# Patient Record
Sex: Female | Born: 1955
Health system: Southern US, Community
[De-identification: ages and names within clinical notes are randomized; demographics above are authoritative.]

## PROBLEM LIST (undated history)

## (undated) DIAGNOSIS — F329 Major depressive disorder, single episode, unspecified: Secondary | ICD-10-CM

## (undated) DIAGNOSIS — F32A Depression, unspecified: Secondary | ICD-10-CM

## (undated) DIAGNOSIS — J849 Interstitial pulmonary disease, unspecified: Secondary | ICD-10-CM

## (undated) DIAGNOSIS — R569 Unspecified convulsions: Secondary | ICD-10-CM

## (undated) DIAGNOSIS — M1990 Unspecified osteoarthritis, unspecified site: Secondary | ICD-10-CM

## (undated) DIAGNOSIS — F419 Anxiety disorder, unspecified: Secondary | ICD-10-CM

## (undated) DIAGNOSIS — R51 Headache: Secondary | ICD-10-CM

## (undated) DIAGNOSIS — I10 Essential (primary) hypertension: Secondary | ICD-10-CM

## (undated) DIAGNOSIS — B9681 Helicobacter pylori [H. pylori] as the cause of diseases classified elsewhere: Secondary | ICD-10-CM

## (undated) DIAGNOSIS — E7800 Pure hypercholesterolemia, unspecified: Secondary | ICD-10-CM

## (undated) DIAGNOSIS — K279 Peptic ulcer, site unspecified, unspecified as acute or chronic, without hemorrhage or perforation: Secondary | ICD-10-CM

## (undated) DIAGNOSIS — J189 Pneumonia, unspecified organism: Secondary | ICD-10-CM

## (undated) DIAGNOSIS — A0472 Enterocolitis due to Clostridium difficile, not specified as recurrent: Secondary | ICD-10-CM

## (undated) DIAGNOSIS — N200 Calculus of kidney: Secondary | ICD-10-CM

## (undated) DIAGNOSIS — B029 Zoster without complications: Secondary | ICD-10-CM

## (undated) DIAGNOSIS — L309 Dermatitis, unspecified: Secondary | ICD-10-CM

## (undated) DIAGNOSIS — J449 Chronic obstructive pulmonary disease, unspecified: Secondary | ICD-10-CM

## (undated) HISTORY — PX: APPENDECTOMY: SHX54

## (undated) HISTORY — PX: CHOLECYSTECTOMY: SHX55

## (undated) HISTORY — DX: Anxiety disorder, unspecified: F41.9

## (undated) HISTORY — PX: TONSILLECTOMY: SUR1361

## (undated) HISTORY — PX: ADENOIDECTOMY: SUR15

## (undated) HISTORY — PX: ABDOMINAL HYSTERECTOMY: SHX81

## (undated) HISTORY — DX: Dermatitis, unspecified: L30.9

---
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
## 2010-06-27 ENCOUNTER — Observation Stay (HOSPITAL_COMMUNITY)
Admission: EM | Admit: 2010-06-27 | Discharge: 2010-06-28 | Payer: Self-pay | Source: Home / Self Care | Admitting: Emergency Medicine

## 2010-12-29 LAB — COMPREHENSIVE METABOLIC PANEL
Alkaline Phosphatase: 58 U/L (ref 39–117)
BUN: 12 mg/dL (ref 6–23)
CO2: 23 mEq/L (ref 19–32)
Calcium: 8 mg/dL — ABNORMAL LOW (ref 8.4–10.5)
GFR calc non Af Amer: >60 mL/min (ref >60)
Glucose, Bld: 82 mg/dL (ref 70–99)
Total Protein: 6.2 g/dL (ref 6.0–8.3)

## 2010-12-29 LAB — URINALYSIS, ROUTINE W REFLEX MICROSCOPIC
Bilirubin Urine: NEGATIVE
Hgb urine dipstick: NEGATIVE
Ketones, ur: NEGATIVE mg/dL
Protein, ur: NEGATIVE mg/dL
Urobilinogen, UA: 0.2 mg/dL (ref 0.0–1.0)

## 2010-12-29 LAB — POCT CARDIAC MARKERS
CKMB, poc: <1 ng/mL — ABNORMAL LOW (ref 1.0–8.0)
CKMB, poc: <1 ng/mL — ABNORMAL LOW (ref 1.0–8.0)
Myoglobin, poc: 39.2 ng/mL (ref 12–200)
Myoglobin, poc: 42.6 ng/mL (ref 12–200)
Troponin i, poc: <0.05 ng/mL (ref 0.00–0.09)
Troponin i, poc: <0.05 ng/mL (ref 0.00–0.09)

## 2010-12-29 LAB — CARDIAC PANEL(CRET KIN+CKTOT+MB+TROPI)
CK, MB: 1 ng/mL (ref 0.3–4.0)
Relative Index: INVALID (ref 0.0–2.5)
Relative Index: INVALID (ref 0.0–2.5)
Total CK: 62 U/L (ref 7–177)
Troponin I: 0.01 ng/mL (ref 0.00–0.06)
Troponin I: 0.03 ng/mL (ref 0.00–0.06)

## 2010-12-29 LAB — CK TOTAL AND CKMB (NOT AT ARMC)
CK, MB: 1 ng/mL (ref 0.3–4.0)
Relative Index: INVALID (ref 0.0–2.5)
Total CK: 70 U/L (ref 7–177)

## 2010-12-29 LAB — CBC
HCT: 30.5 % — ABNORMAL LOW (ref 36.0–46.0)
MCHC: 34.1 g/dL (ref 30.0–36.0)
MCV: 96.5 fL (ref 78.0–100.0)
RDW: 12.5 % (ref 11.5–15.5)

## 2010-12-29 LAB — LIPID PANEL
Cholesterol: 152 mg/dL (ref 0–200)
HDL: 69 mg/dL (ref >39)
Total CHOL/HDL Ratio: 2.2 RATIO
Triglycerides: 148 mg/dL (ref <150)

## 2010-12-29 LAB — TROPONIN I: Troponin I: 0.03 ng/mL (ref 0.00–0.06)

## 2010-12-29 LAB — LIPASE, BLOOD: Lipase: 25 U/L (ref 11–59)

## 2010-12-29 LAB — DIFFERENTIAL
Basophils Relative: 0 % (ref 0–1)
Lymphs Abs: 3.5 10*3/uL (ref 0.7–4.0)
Monocytes Relative: 5 % (ref 3–12)
Neutro Abs: 3 10*3/uL (ref 1.7–7.7)
Neutrophils Relative %: 43 % (ref 43–77)

## 2012-04-11 ENCOUNTER — Telehealth: Payer: Self-pay

## 2012-04-11 NOTE — Telephone Encounter (Signed)
Received a phone call from [name]. York Spaniel he was calling for his wife, this pt, to schedule a colonoscopy. He talked to her about some dates and she now wants to schedule in August. I told him I do not have the August schedule but I will call back to triage and schedule sometime in July. ( Pt is on my recall for August appt.

## 2012-04-25 ENCOUNTER — Telehealth: Payer: Self-pay

## 2012-04-25 NOTE — Telephone Encounter (Signed)
Pt had wanted to have a colonoscopy in August sometime. I left message on machine that I now have the August schedule.

## 2012-04-30 NOTE — Telephone Encounter (Signed)
Called and spoke to [employer]. He said he is sending her the message and she will call when she is ready to schedule colonoscopy.

## 2012-05-20 ENCOUNTER — Other Ambulatory Visit: Payer: Self-pay

## 2012-05-20 DIAGNOSIS — Z139 Encounter for screening, unspecified: Secondary | ICD-10-CM

## 2012-05-27 NOTE — Telephone Encounter (Signed)
Gastroenterology Pre-Procedure Form     Request Date: 06/10/2012     Requesting Physician:   Husband called to schedule  No referral This is first colonoscopy    PATIENT INFORMATION:  Diane [name] is a 56 y.o., female (DOB=December 14, 1955).  PROCEDURE: Procedure(s) requested: colonoscopy Procedure Reason: screening for colon cancer  PATIENT REVIEW QUESTIONS: The patient reports the following:   1. Diabetes Melitis: no 2. Joint replacements in the past 12 months: no 3. Major health problems in the past 3 months: no 4. Has an artificial valve or MVP:no 5. Has been advised in past to take antibiotics in advance of a procedure like teeth cleaning: no}    MEDICATIONS & ALLERGIES:    Patient reports the following regarding taking any blood thinners:   Plavix? no Aspirin?yes  Coumadin?  no  Patient confirms/reports the following medications:  Current Outpatient Prescriptions  Medication Sig Dispense Refill  . aspirin 81 MG tablet Take 81 mg by mouth daily.      Marland Kitchen buPROPion (WELLBUTRIN XL) 150 MG 24 hr tablet Take 150 mg by mouth 3 (three) times daily.      . diclofenac (VOLTAREN) 75 MG EC tablet Take 75 mg by mouth 2 (two) times daily.      Marland Kitchen loratadine (CLARITIN) 10 MG tablet Take 10 mg by mouth daily.      . Multiple Vitamin (MULTIVITAMIN) tablet Take 1 tablet by mouth daily.      . NON FORMULARY Probiotic  daily      . simvastatin (ZOCOR) 20 MG tablet Take 20 mg by mouth at bedtime.        Patient confirms/reports the following allergies:  Allergies  Allergen Reactions  . Penicillins     Unknown type of reaction    Patient is appropriate to schedule for requested procedure(s): yes  AUTHORIZATION INFORMATION Primary Insurance:   ID #:   Group #:  Pre-Cert / Auth required:  Pre-Cert / Auth #:   Secondary Insurance:   ID #:   Group #:  Pre-Cert / Auth required Pre-Cert / Auth #:   No orders of the defined types were placed in this encounter.    SCHEDULE  INFORMATION: Procedure has been scheduled as follows:  Date: 06/10/2012           Time: 12:45 PM  Location: [facility] Short Stay  This Gastroenterology Pre-Precedure Form is being routed to the following provider(s) for review: R. Roetta Sessions, MD

## 2012-05-28 ENCOUNTER — Encounter (HOSPITAL_COMMUNITY): Payer: Self-pay | Admitting: Pharmacy Technician

## 2012-05-28 NOTE — Telephone Encounter (Signed)
OK to schedule

## 2012-05-29 MED ORDER — PEG 3350-KCL-NA BICARB-NACL 420 G PO SOLR: 4000.0000 L | ORAL | Status: AC

## 2012-05-29 NOTE — Telephone Encounter (Signed)
Rx sent to [facility] [place].  Instructions mailed to pt.

## 2012-05-31 ENCOUNTER — Telehealth: Payer: Self-pay | Admitting: *Deleted

## 2012-05-31 NOTE — Telephone Encounter (Signed)
Mr Diane [name] called today for his wife. They would like to know if you can order her the pills for her prep as opposed to the drink. Please call them back. Also, they want [place] to be aware that she is a smoker. Thanks.

## 2012-06-03 NOTE — Telephone Encounter (Signed)
LMOM that Dr.Rourk does not use the pills. Call if more questions.

## 2012-06-10 ENCOUNTER — Encounter (HOSPITAL_COMMUNITY): Payer: Self-pay | Admitting: *Deleted

## 2012-06-10 ENCOUNTER — Ambulatory Visit (HOSPITAL_COMMUNITY)
Admission: RE | Admit: 2012-06-10 | Discharge: 2012-06-10 | Disposition: A | Discharge status: Home / Self Care | Payer: 59 | Source: Ambulatory Visit | Attending: Internal Medicine | Admitting: Internal Medicine

## 2012-06-10 ENCOUNTER — Encounter (HOSPITAL_COMMUNITY)
Admission: RE | Disposition: A | Discharge status: Home / Self Care | Payer: Self-pay | Source: Ambulatory Visit | Attending: Internal Medicine

## 2012-06-10 DIAGNOSIS — Z139 Encounter for screening, unspecified: Secondary | ICD-10-CM

## 2012-06-10 DIAGNOSIS — Z8371 Family history of colonic polyps: Secondary | ICD-10-CM

## 2012-06-10 DIAGNOSIS — Z1211 Encounter for screening for malignant neoplasm of colon: Secondary | ICD-10-CM

## 2012-06-10 DIAGNOSIS — D126 Benign neoplasm of colon, unspecified: Secondary | ICD-10-CM

## 2012-06-10 HISTORY — DX: Pneumonia, unspecified organism: J18.9

## 2012-06-10 HISTORY — DX: Unspecified osteoarthritis, unspecified site: M19.90

## 2012-06-10 HISTORY — DX: Headache: R51

## 2012-06-10 HISTORY — PX: COLONOSCOPY: SHX5424

## 2012-06-10 SURGERY — COLONOSCOPY
Anesthesia: Moderate Sedation

## 2012-06-10 MED ORDER — STERILE WATER FOR IRRIGATION IR SOLN
Status: DC | PRN
  Administered 2012-06-10: 13:00:00

## 2012-06-10 MED ORDER — MIDAZOLAM HCL 5 MG/5ML IJ SOLN
INTRAMUSCULAR | Status: AC
  Filled 2012-06-10: qty 10

## 2012-06-10 MED ORDER — MEPERIDINE HCL 100 MG/ML IJ SOLN
INTRAMUSCULAR | Status: DC | PRN
  Administered 2012-06-10: 50 mg via INTRAVENOUS
  Administered 2012-06-10: 25 mg via INTRAVENOUS
  Administered 2012-06-10: 50 mg via INTRAVENOUS

## 2012-06-10 MED ORDER — MIDAZOLAM HCL 5 MG/5ML IJ SOLN
INTRAMUSCULAR | Status: DC | PRN
  Administered 2012-06-10 (×3): 2 mg via INTRAVENOUS

## 2012-06-10 MED ORDER — SODIUM CHLORIDE 0.45 % IV SOLN
Freq: Once | INTRAVENOUS | Status: AC
  Administered 2012-06-10: 13:00:00 via INTRAVENOUS

## 2012-06-10 MED ORDER — MEPERIDINE HCL 100 MG/ML IJ SOLN
INTRAMUSCULAR | Status: AC
  Filled 2012-06-10: qty 2

## 2012-06-10 NOTE — Op Note (Signed)
[facility] [address] [place], [address]   COLONOSCOPY PROCEDURE REPORT  PATIENT: Diane [name], Diane [name]  MR#:         [id number] BIRTHDATE: 1956/09/06 , 56  yrs. old GENDER: Female ENDOSCOPIST: R.  Roetta Sessions, MD FACP [facility] REFERRED BY:   Marrian Salvage, [facility], [place], [place] PROCEDURE DATE:  06/10/2012 PROCEDURE:     colonoscopy with biopsy and snare polypectomy  INDICATIONS: first ever colonoscopy. Multiple family members with colonic polyps  INFORMED CONSENT:  The risks, benefits, alternatives and imponderables including but not limited to bleeding, perforation as well as the possibility of a missed lesion have been reviewed.  The potential for biopsy, lesion removal, etc. have also been discussed.  Questions have been answered.  All parties agreeable. Please see the history and physical in the medical record for more information.  MEDICATIONS: Versed 6 mg IV and Demerol 125 mg IV in divided doses.  DESCRIPTION OF PROCEDURE:  After a digital rectal exam was performed, the EC-[id number] ([id number])  colonoscope was advanced from the anus through the rectum and colon to the area of the cecum, ileocecal valve and appendiceal orifice.  The cecum was deeply intubated.  These structures were well-seen and photographed for the record.  From the level of the cecum and ileocecal valve, the scope was slowly and cautiously withdrawn.  The mucosal surfaces were carefully surveyed utilizing scope tip deflection to facilitate fold flattening as needed.  The scope was pulled down into the rectum where a thorough examination  was performed.    FINDINGS:  marginal preparation. Grossly normal rectum. Rectal vault small; unable to retroflex. Multiple polyps just distal to the ileocecal valve as well as the descending and sigmoid segments. The largest 5 mm in dimensions. Otherwise, the remainder of colonic mucosa appeared normal.  THERAPEUTIC / DIAGNOSTIC MANEUVERS  PERFORMED:  the above-mentioned polyps were cold biopsied and hot snared, respectively.  COMPLICATIONS: none  CECAL WITHDRAWAL TIME:  14 minutes  IMPRESSION:  colonic polyps-removed as described above.  RECOMMENDATIONS: Followup on pathology.   _______________________________ eSigned:  R. Roetta Sessions, MD FACP [facility] 06/10/2012 1:48 PM   CC:

## 2012-06-10 NOTE — H&P (Signed)
Primary Care Physician:  Marrian Salvage, [place] Primary Gastroenterologist:  Dr. Carla Drape  Pre-Procedure History & Physical: HPI:  Diane [name] is a 56 y.o. female is here for a screening colonoscopy. No bowel symptoms. No family history of colon cancer but mother, father brothers and sisters have had frequent colonoscopies because of polyps. No history of colectomy. No History of FAP.  No prior colonoscopy.  Past Medical History  Diagnosis Date  . Pneumonia     5 years ago  . Headache   . Arthritis     fingers    Past Surgical History  Procedure Date  . Abdominal hysterectomy   . Cholecystectomy   . Knee arthroscopy 1973    right knee,  torn cart    Prior to Admission medications   Medication Sig Start Date End Date Taking? Authorizing Provider  aspirin EC 81 MG tablet Take 81 mg by mouth daily.   Yes Historical Provider, MD  buPROPion (WELLBUTRIN SR) 150 MG 12 hr tablet Take 150 mg by mouth 3 (three) times daily.   Yes Historical Provider, MD  diclofenac (VOLTAREN) 75 MG EC tablet Take 75 mg by mouth 2 (two) times daily.   Yes Historical Provider, MD  loratadine (CLARITIN) 10 MG tablet Take 10 mg by mouth daily.   Yes Historical Provider, MD  Multiple Vitamin (MULTIVITAMIN WITH MINERALS) TABS Take 1 tablet by mouth daily.   Yes Historical Provider, MD  Probiotic Product (PROBIOTIC DAILY) CAPS Take 1 capsule by mouth daily.   Yes Historical Provider, MD  simvastatin (ZOCOR) 20 MG tablet Take 20 mg by mouth at bedtime.   Yes Historical Provider, MD    Allergies as of 05/20/2012  . (Not on File)    History reviewed. No pertinent family history.  History   Social History  . Marital Status: Married    Spouse Name: N/A    Number of Children: N/A  . Years of Education: N/A   Occupational History  . Not on file.   Social History Main Topics  . Smoking status: Current Everyday Smoker -- 0.2 packs/day for 30 years    Types: Cigarettes  . Smokeless tobacco: Not on file   . Alcohol Use: Yes     3 per day  . Drug Use: No  . Sexually Active:    Other Topics Concern  . Not on file   Social History Narrative  . No narrative on file    Review of Systems: See HPI, otherwise negative ROS  Physical Exam: BP 135/69  Pulse 65  Temp 97.8 F (36.6 C) (Oral)  Resp 22  Ht 5\' 5"  (1.651 m)  Wt 142 lb (64.411 kg)  BMI 23.63 kg/m2  SpO2 96% General:   Alert,  Well-developed, well-nourished, pleasant and cooperative in NAD Head:  Normocephalic and atraumatic. Eyes:  Sclera clear, no icterus.   Conjunctiva pink. Ears:  Normal auditory acuity. Nose:  No deformity, discharge,  or lesions. Mouth:  No deformity or lesions, dentition normal. Neck:  Supple; no masses or thyromegaly. Lungs:  Clear throughout to auscultation.   No wheezes, crackles, or rhonchi. No acute distress. Heart:  Regular rate and rhythm; no murmurs, clicks, rubs,  or gallops. Abdomen:  Soft, nontender and nondistended. No masses, hepatosplenomegaly or hernias noted. Normal bowel sounds, without guarding, and without rebound.   Msk:  Symmetrical without gross deformities. Normal posture. Pulses:  Normal pulses noted. Extremities:  Without clubbing or edema. Neurologic:  Alert and  oriented x4;  grossly normal neurologically.  Skin:  Intact without significant lesions or rashes. Cervical Nodes:  No significant cervical adenopathy. Psych:  Alert and cooperative. Normal mood and affect.  Impression/Plan: [name] is now here to undergo a screening colonoscopy.  Positive family history of colon polyps without further information known.  Risks, benefits, limitations, imponderables and alternatives regarding colonoscopy have been reviewed with the patient. Questions have been answered. All parties agreeable.

## 2012-06-13 ENCOUNTER — Encounter (HOSPITAL_COMMUNITY): Payer: Self-pay | Admitting: Internal Medicine

## 2012-06-13 ENCOUNTER — Encounter: Payer: Self-pay | Admitting: Internal Medicine

## 2012-06-18 ENCOUNTER — Encounter: Payer: Self-pay | Admitting: *Deleted

## 2012-10-06 ENCOUNTER — Emergency Department (HOSPITAL_COMMUNITY): Payer: 59

## 2012-10-06 ENCOUNTER — Emergency Department (HOSPITAL_COMMUNITY)
Admission: EM | Admit: 2012-10-06 | Discharge: 2012-10-07 | Disposition: A | Discharge status: Home / Self Care | Payer: 59 | Attending: Emergency Medicine | Admitting: Emergency Medicine

## 2012-10-06 ENCOUNTER — Encounter (HOSPITAL_COMMUNITY): Payer: Self-pay | Admitting: *Deleted

## 2012-10-06 DIAGNOSIS — R259 Unspecified abnormal involuntary movements: Secondary | ICD-10-CM | POA: Insufficient documentation

## 2012-10-06 DIAGNOSIS — R569 Unspecified convulsions: Secondary | ICD-10-CM

## 2012-10-06 DIAGNOSIS — R42 Dizziness and giddiness: Secondary | ICD-10-CM | POA: Insufficient documentation

## 2012-10-06 DIAGNOSIS — F17200 Nicotine dependence, unspecified, uncomplicated: Secondary | ICD-10-CM | POA: Insufficient documentation

## 2012-10-06 DIAGNOSIS — R55 Syncope and collapse: Secondary | ICD-10-CM | POA: Insufficient documentation

## 2012-10-06 DIAGNOSIS — Z7982 Long term (current) use of aspirin: Secondary | ICD-10-CM | POA: Insufficient documentation

## 2012-10-06 DIAGNOSIS — E7800 Pure hypercholesterolemia, unspecified: Secondary | ICD-10-CM | POA: Insufficient documentation

## 2012-10-06 DIAGNOSIS — R5381 Other malaise: Secondary | ICD-10-CM | POA: Insufficient documentation

## 2012-10-06 DIAGNOSIS — Z8739 Personal history of other diseases of the musculoskeletal system and connective tissue: Secondary | ICD-10-CM | POA: Insufficient documentation

## 2012-10-06 DIAGNOSIS — Z79899 Other long term (current) drug therapy: Secondary | ICD-10-CM | POA: Insufficient documentation

## 2012-10-06 DIAGNOSIS — Z8701 Personal history of pneumonia (recurrent): Secondary | ICD-10-CM | POA: Insufficient documentation

## 2012-10-06 DIAGNOSIS — F29 Unspecified psychosis not due to a substance or known physiological condition: Secondary | ICD-10-CM | POA: Insufficient documentation

## 2012-10-06 HISTORY — DX: Pure hypercholesterolemia, unspecified: E78.00

## 2012-10-06 LAB — COMPREHENSIVE METABOLIC PANEL
BUN: 11 mg/dL (ref 6–23)
CO2: 25 mEq/L (ref 19–32)
Chloride: 97 mEq/L (ref 96–112)
Creatinine, Ser: 0.65 mg/dL (ref 0.50–1.10)
GFR calc non Af Amer: >90 mL/min (ref >90)
Total Bilirubin: 0.3 mg/dL (ref 0.3–1.2)

## 2012-10-06 LAB — CBC WITH DIFFERENTIAL/PLATELET
Lymphocytes Relative: 20 % (ref 12–46)
Lymphs Abs: 2.1 10*3/uL (ref 0.7–4.0)
Neutrophils Relative %: 75 % (ref 43–77)
Platelets: 328 10*3/uL (ref 150–400)
RBC: 3.65 MIL/uL — ABNORMAL LOW (ref 3.87–5.11)
WBC: 10.6 10*3/uL — ABNORMAL HIGH (ref 4.0–10.5)

## 2012-10-06 LAB — URINALYSIS, ROUTINE W REFLEX MICROSCOPIC
Glucose, UA: NEGATIVE mg/dL
Leukocytes, UA: NEGATIVE
Protein, ur: NEGATIVE mg/dL
pH: 5.5 (ref 5.0–8.0)

## 2012-10-06 LAB — POCT I-STAT TROPONIN I: Troponin i, poc: 0 ng/mL (ref 0.00–0.08)

## 2012-10-06 MED ORDER — SODIUM CHLORIDE 0.9 % IV BOLUS (SEPSIS)
1000.0000 mL | Freq: Once | INTRAVENOUS | Status: AC
  Administered 2012-10-06: 1000 mL via INTRAVENOUS

## 2012-10-06 NOTE — ED Notes (Signed)
Dr. Rancour at bedside. 

## 2012-10-06 NOTE — ED Notes (Addendum)
Was sitting with friend, did not feel well, felt light headed, woozy, color changed, period of not speaking, period of shaking, also vomited and then "felt better" (partly her description, partly her paraphrase of friends previous description of what happened), seen by EMS at scene, "VS were good", encouraged to come to ED, declined transport, "gradually progressively better", here by POV. Alert, NAD, calm, interactive, skin W&D, resps e/u, speaking in clear complete sentences. C/o HA, 4/10, "also hunger, has not eaten". LS CTA, MAEx4 equally, speech clear, no droop or drift, PERRL 4mm.

## 2012-10-06 NOTE — ED Notes (Signed)
Provider in with patient.

## 2012-10-06 NOTE — ED Provider Notes (Signed)
History     CSN: [id number]  Arrival date & time 10/06/12  2000   First MD Initiated Contact with Patient 10/06/12 2101      Chief Complaint  Patient presents with  . Near Syncope  . Shaking    (Consider location/radiation/quality/duration/timing/severity/associated sxs/prior treatment) Patient is a 56 y.o. female presenting with general illness. The history is provided by the patient. No language interpreter was used.  Illness  The current episode started today. The problem occurs rarely. The problem has been resolved. The problem is moderate. Nothing relieves the symptoms. Nothing aggravates the symptoms. Pertinent negatives include no fever, no abdominal pain, no constipation, no diarrhea, no nausea, no vomiting, no congestion, no headaches, no sore throat, no cough and no rash.    Past Medical History  Diagnosis Date  . Pneumonia     5 years ago  . Headache   . Arthritis     fingers  . Hypercholesterolemia     Past Surgical History  Procedure Date  . Abdominal hysterectomy   . Cholecystectomy   . Knee arthroscopy 1973    right knee,  torn cart  . Colonoscopy 06/10/2012    Procedure: COLONOSCOPY;  Surgeon: Corbin Ade, MD;  Location: [facility];  Service: Endoscopy;  Laterality: N/A;  12:45 PM    History reviewed. No pertinent family history.  History  Substance Use Topics  . Smoking status: Current Every Day Smoker -- 0.2 packs/day for 30 years    Types: Cigarettes  . Smokeless tobacco: Not on file  . Alcohol Use: Yes     Comment: 3 per day    OB History    Grav Para Term Preterm Abortions TAB SAB Ect Mult Living                  Review of Systems  Constitutional: Negative for fever and chills.  HENT: Negative for congestion and sore throat.   Respiratory: Negative for cough and shortness of breath.   Cardiovascular: Negative for chest pain and leg swelling.  Gastrointestinal: Negative for nausea, vomiting, abdominal pain, diarrhea and  constipation.  Genitourinary: Negative for dysuria and frequency.  Skin: Negative for color change and rash.  Neurological: Positive for dizziness, syncope, weakness and light-headedness. Negative for headaches.  Psychiatric/Behavioral: Negative for confusion and agitation.  All other systems reviewed and are negative.    Allergies  Penicillins  Home Medications   Current Outpatient Rx  Name  Route  Sig  Dispense  Refill  . ASPIRIN EC 81 MG PO TBEC   Oral   Take 81 mg by mouth daily.         . BUPROPION HCL ER (SR) 150 MG PO TB12   Oral   Take 150 mg by mouth 2 (two) times daily. Take 2 tablets in the am and 1 tablet in the pm         . CALCIUM CARBONATE-VITAMIN D 500-200 MG-UNIT PO TABS   Oral   Take 1 tablet by mouth daily.         Marland Kitchen CETIRIZINE HCL 10 MG PO TABS   Oral   Take 10 mg by mouth daily.         . ADULT MULTIVITAMIN W/MINERALS CH   Oral   Take 1 tablet by mouth daily.         Marland Kitchen PROBIOTIC DAILY PO CAPS   Oral   Take 1 capsule by mouth daily.         Marland Kitchen  SIMVASTATIN 20 MG PO TABS   Oral   Take 20 mg by mouth at bedtime.           BP 139/70  Pulse 84  Temp 98 F (36.7 C) (Oral)  Resp 18  SpO2 100%  Physical Exam  Vitals reviewed. Constitutional: She is oriented to person, place, and time. She appears well-developed and well-nourished. No distress.  HENT:  Head: Normocephalic and atraumatic.  Eyes: EOM are normal. Pupils are equal, round, and reactive to light.  Neck: Normal range of motion. Neck supple.  Cardiovascular: Normal rate and regular rhythm.   Pulmonary/Chest: Effort normal. No respiratory distress.  Abdominal: Soft. She exhibits no distension.  Musculoskeletal: Normal range of motion. She exhibits no edema.  Neurological: She is alert and oriented to person, place, and time. She has normal strength. No cranial nerve deficit or sensory deficit. Coordination normal. GCS eye subscore is 4. GCS verbal subscore is 5. GCS motor  subscore is 6.  Reflex Scores:      Patellar reflexes are 2+ on the right side and 2+ on the left side. Skin: Skin is warm and dry.  Psychiatric: She has a normal mood and affect. Her behavior is normal.    ED Course  Procedures (including critical care time)  Results for orders placed during the hospital encounter of 10/06/12  COMPREHENSIVE METABOLIC PANEL      Component Value Range   Sodium 135  135 - 145 mEq/L   Potassium 4.0  3.5 - 5.1 mEq/L   Chloride 97  96 - 112 mEq/L   CO2 25  19 - 32 mEq/L   Glucose, Bld 99  70 - 99 mg/dL   BUN 11  6 - 23 mg/dL   Creatinine, Ser 1.47  0.50 - 1.10 mg/dL   Calcium 82.9  8.4 - 56.2 mg/dL   Total Protein 8.2  6.0 - 8.3 g/dL   Albumin 4.1  3.5 - 5.2 g/dL   AST 22  0 - 37 U/L   ALT 17  0 - 35 U/L   Alkaline Phosphatase 80  39 - 117 U/L   Total Bilirubin 0.3  0.3 - 1.2 mg/dL   GFR calc non Af Amer >90  >90 mL/min   GFR calc Af Amer >90  >90 mL/min  CBC WITH DIFFERENTIAL      Component Value Range   WBC 10.6 (*) 4.0 - 10.5 K/uL   RBC 3.65 (*) 3.87 - 5.11 MIL/uL   Hemoglobin 12.0  12.0 - 15.0 g/dL   HCT 13.0 (*) 86.5 - 78.4 %   MCV 95.6  78.0 - 100.0 fL   MCH 32.9  26.0 - 34.0 pg   MCHC 34.4  30.0 - 36.0 g/dL   RDW 69.6  29.5 - 28.4 %   Platelets 328  150 - 400 K/uL   Neutrophils Relative 75  43 - 77 %   Neutro Abs 7.9 (*) 1.7 - 7.7 K/uL   Lymphocytes Relative 20  12 - 46 %   Lymphs Abs 2.1  0.7 - 4.0 K/uL   Monocytes Relative 4  3 - 12 %   Monocytes Absolute 0.4  0.1 - 1.0 K/uL   Eosinophils Relative 1  0 - 5 %   Eosinophils Absolute 0.2  0.0 - 0.7 K/uL   Basophils Relative 0  0 - 1 %   Basophils Absolute 0.0  0.0 - 0.1 K/uL  URINALYSIS, ROUTINE W REFLEX MICROSCOPIC      Component  Value Range   Color, Urine YELLOW  YELLOW   APPearance CLEAR  CLEAR   Specific Gravity, Urine 1.013  1.005 - 1.030   pH 5.5  5.0 - 8.0   Glucose, UA NEGATIVE  NEGATIVE mg/dL   Hgb urine dipstick NEGATIVE  NEGATIVE   Bilirubin Urine NEGATIVE   NEGATIVE   Ketones, ur NEGATIVE  NEGATIVE mg/dL   Protein, ur NEGATIVE  NEGATIVE mg/dL   Urobilinogen, UA 0.2  0.0 - 1.0 mg/dL   Nitrite NEGATIVE  NEGATIVE   Leukocytes, UA NEGATIVE  NEGATIVE  POCT I-STAT TROPONIN I      Component Value Range   Troponin i, poc 0.00  0.00 - 0.08 ng/mL   Comment 3            CT Head Wo Contrast (Final result)   Result time:10/06/12 2315    Final result by Rad Results In Interface (10/06/12 23:15:38)    Narrative:   *RADIOLOGY REPORT*  Clinical Data: Near-syncope.  CT HEAD WITHOUT CONTRAST  Technique: Contiguous axial images were obtained from the base of the skull through the vertex without contrast.  Comparison: None.  Findings: The brain appears normal without infarct, hemorrhage, mass lesion, mass effect, midline shift or abnormal extra-axial fluid collection. No hydrocephalus or pneumocephalus. The calvarium is intact.  IMPRESSION: Negative study.   Original Report Authenticated By: Holley Dexter, M.D.          No results found.   No diagnosis found.    MDM  Pt w/ PMHx of HLD now w/ possible seizure/syncope event. States she was in normal state of health today. Had not eaten dinner. Drank an alcoholic beverage w/ a friend. Went to stand up and had acute onset lightheadedness, weakness, blurred vision, generalized shaking. Witness helped her to floor. She is unsure of true syncope. Witness states sx lasted approx 7 minutes. Pt was briefly confused and incontinent of urine. No prior hx of seizure activity. Pt does admit to one episode similar to this several wks ago but less severe. Denies prodrome of chest pain, palpitations, dyspnea or severe HA.   Exam: vitals normal, no focal deficit, well appearing, no seizure activity.   Ddx: possible new onset seizure, orthostatic hypotension, hypoglycemia, e-lyte abnormality. Based on hx exam doubt CVA, arrhythmia.   Plan: will check CT head, ECG, cbc, cmp, u/a, troponin,  orthostatics and give 1L ivf   Course: reassessed - vitals stable, NAD, CT head - NAICA, ECG - NSR no acute ischemic changes, normal intervals, troponin neg, u/a clear, hgb 12, e-lytes normal. Based on these findings increased concern for new onset seizure. Stable for d/c home. Recommend refrain from driving and ETOH until seen by neurology. Given referral and return precautions. D/c home in good condition. No seizure activity noted in ED.   1. Seizure-like activity   2. Atypical syncope    New Prescriptions   No medications on file   [facility] [address] [place] [address] [phone] Schedule an appointment as soon as possible for a visit         Audelia Hives, MD 10/07/12 [phone]

## 2012-10-08 NOTE — ED Provider Notes (Signed)
I saw and evaluated the patient, reviewed the resident's note and I agree with the findings and plan.  Episode of syncope versus seizure.  Drinking alcohol, became "dizzy", worsened with standing up, lightheadedness, "shaking".  Remembers event. Did not bite tongue but did have urine incontinence  And transient confusion.  Now back to baseline. No history of seizures. Nonfocal neuro exam.  Glynn Octave, MD 10/08/12 1010

## 2012-10-30 ENCOUNTER — Other Ambulatory Visit: Payer: Self-pay | Admitting: Neurology

## 2012-10-30 DIAGNOSIS — E785 Hyperlipidemia, unspecified: Secondary | ICD-10-CM

## 2012-10-30 DIAGNOSIS — F329 Major depressive disorder, single episode, unspecified: Secondary | ICD-10-CM

## 2012-10-30 DIAGNOSIS — F32A Depression, unspecified: Secondary | ICD-10-CM

## 2012-10-30 DIAGNOSIS — G40909 Epilepsy, unspecified, not intractable, without status epilepticus: Secondary | ICD-10-CM

## 2012-11-06 ENCOUNTER — Ambulatory Visit
Admission: RE | Admit: 2012-11-06 | Discharge: 2012-11-06 | Disposition: A | Discharge status: Home / Self Care | Payer: 59 | Source: Ambulatory Visit | Attending: Neurology | Admitting: Neurology

## 2012-11-06 DIAGNOSIS — F419 Anxiety disorder, unspecified: Secondary | ICD-10-CM

## 2012-11-06 DIAGNOSIS — G40909 Epilepsy, unspecified, not intractable, without status epilepticus: Secondary | ICD-10-CM

## 2012-11-06 DIAGNOSIS — F32A Depression, unspecified: Secondary | ICD-10-CM

## 2012-11-06 DIAGNOSIS — E785 Hyperlipidemia, unspecified: Secondary | ICD-10-CM

## 2012-11-06 MED ORDER — GADOBENATE DIMEGLUMINE 529 MG/ML IV SOLN
13.0000 mL | Freq: Once | INTRAVENOUS | Status: AC | PRN
  Administered 2012-11-06: 13 mL via INTRAVENOUS

## 2012-11-30 ENCOUNTER — Other Ambulatory Visit: Payer: Self-pay

## 2013-03-14 ENCOUNTER — Ambulatory Visit (INDEPENDENT_AMBULATORY_CARE_PROVIDER_SITE_OTHER): Payer: 59 | Admitting: Nurse Practitioner

## 2013-03-14 ENCOUNTER — Encounter: Payer: Self-pay | Admitting: Nurse Practitioner

## 2013-03-14 VITALS — BP 103/59 | HR 81 | Ht 64.0 in | Wt 150.0 lb

## 2013-03-14 DIAGNOSIS — R569 Unspecified convulsions: Secondary | ICD-10-CM

## 2013-03-14 MED ORDER — TOPIRAMATE 50 MG PO TABS: 50.0000 mg | ORAL_TABLET | Freq: Two times a day (BID) | ORAL | Status: DC

## 2013-03-14 NOTE — Patient Instructions (Addendum)
Continue Topamax 50 mg twice daily, renew med May start driving 11/15/8655 if continues to be seizure-free Followup in 6 months

## 2013-03-14 NOTE — Progress Notes (Signed)
HPI: Patient returns for followup after last visit 11/15/2012. She has a history of seizure disorder. When last seen she was having irritability on Keppra and was switched over to topiramate titrating it slowly until dose was therapeutic. Her last seizure occurred 10/06/2012. Her EEG was abnormal with evidence of intermittent pneumatic temporal lobe waves and also spike slow wave suggestive of epileptiform  discharges. Her MRI of the brain was normal, she is not driving until next month. She denies any side effects of the Topamax. She is off her Keppra at this point.  ROS:  Negative  Physical Exam General: well developed, well nourished, seated, in no evident distress Head: head normocephalic and atraumatic. Oropharynx benign Neck: supple with no carotid  bruits Cardiovascular: regular rate and rhythm, no murmurs  Neurologic Exam Mental Status: Awake and fully alert. Oriented to place and time. Follows all commands. Speech and language normal.   Cranial Nerves: Fundoscopic exam reveals sharp disc margins. Pupils equal, briskly reactive to light. Extraocular movements full without nystagmus. Visual fields full to confrontation. Hearing intact and symmetric to finger snap. Facial sensation intact. Face, tongue, palate move normally and symmetrically. Neck flexion and extension normal.  Motor: Normal bulk and tone. Normal strength in all tested extremity muscles.No focal weakness Sensory.: intact to touch and pinprick and vibratory.  Coordination: Rapid alternating movements normal in all extremities. Finger-to-nose and heel-to-shin performed accurately bilaterally. Gait and Station: Arises from chair without difficulty. Stance is normal. Gait demonstrates normal stride length and balance . Able to heel, toe and tandem walk without difficulty.  Reflexes: 2+ and symmetric. Toes downgoing.     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 Last  seizure 10/06/2012     PLAN: Continue topiramate 50 twice a day May begin driving 6 /16/ 1096 if no further seizure activity Followup in 6 months, call for any seizure activity  Nilda Riggs, GNP-BC APRN

## 2013-08-21 ENCOUNTER — Other Ambulatory Visit: Payer: Self-pay

## 2013-09-08 ENCOUNTER — Ambulatory Visit (INDEPENDENT_AMBULATORY_CARE_PROVIDER_SITE_OTHER): Payer: 59 | Admitting: Nurse Practitioner

## 2013-09-08 ENCOUNTER — Encounter: Payer: Self-pay | Admitting: Nurse Practitioner

## 2013-09-08 VITALS — BP 106/67 | HR 73 | Ht 64.0 in | Wt 144.0 lb

## 2013-09-08 DIAGNOSIS — R569 Unspecified convulsions: Secondary | ICD-10-CM

## 2013-09-08 MED ORDER — TOPIRAMATE 50 MG PO TABS: 50.0000 mg | ORAL_TABLET | Freq: Two times a day (BID) | ORAL | Status: DC

## 2013-09-08 NOTE — Patient Instructions (Signed)
Continue Topamax 50 mg twice daily Follow up yearly and when necessary Call for any seizure activity

## 2013-09-08 NOTE — Progress Notes (Signed)
[facility]  PATIENT: Diane [name] DOB: [birth year]   REASON FOR VISIT: Followup for seizures   HISTORY OF PRESENT ILLNESS:Diane [name], 57 year old female returns for followup. She has a history of seizure disorder. She had irritability on Keppra and was switched over to topiramate titrating it slowly until dose was therapeutic. Her last seizure occurred 10/06/2012. Her EEG was abnormal with evidence of intermittent pneumatic temporal lobe waves and also spike slow wave suggestive of epileptiform discharges. Her MRI of the brain was normal, she returned to driving in June 6045. .  She denies any side effects of the Topamax. She is off her Keppra at this point.    REVIEW OF SYSTEMS: Full 14 system review of systems performed and notable only for: all negative Constitutional: N/A  Cardiovascular: N/A  Ear/Nose/Throat: N/A  Skin: N/A  Eyes: N/A  Respiratory: N/A  Gastroitestinal: N/A  Hematology/Lymphatic: N/A  Endocrine: N/A Musculoskeletal:N/A  Allergy/Immunology: N/A  Neurological: N/A Psychiatric: N/A   ALLERGIES: Allergies  Allergen Reactions  . Penicillins     Unknown type of reaction    HOME MEDICATIONS: Outpatient Prescriptions Prior to Visit  Medication Sig Dispense Refill  . buPROPion (WELLBUTRIN SR) 150 MG 12 hr tablet Take 150 mg by mouth 2 (two) times daily. Take 2 tablets in the am      . calcium-vitamin D (OSCAL WITH D) 500-200 MG-UNIT per tablet Take 1 tablet by mouth daily.      . cetirizine (ZYRTEC) 10 MG tablet Take 10 mg by mouth daily.      Marland Kitchen escitalopram (LEXAPRO) 10 MG tablet Take 10 mg by mouth daily.       . Multiple Vitamin (MULTIVITAMIN WITH MINERALS) TABS Take 1 tablet by mouth daily.      . simvastatin (ZOCOR) 20 MG tablet Take 20 mg by mouth at bedtime.      . topiramate (TOPAMAX) 50 MG tablet Take 1 tablet (50 mg total) by mouth 2 (two) times daily.  60 tablet  6  . aspirin EC 81 MG tablet Take 81 mg by mouth  daily.      . Probiotic Product (PROBIOTIC DAILY) CAPS Take 1 capsule by mouth daily.       No facility-administered medications prior to visit.    PAST MEDICAL HISTORY: Past Medical History  Diagnosis Date  . Pneumonia     5 years ago  . Headache(784.0)   . Arthritis     fingers  . Hypercholesterolemia     PAST SURGICAL HISTORY: Past Surgical History  Procedure Laterality Date  . Abdominal hysterectomy    . Cholecystectomy    . Knee arthroscopy  1973    right knee,  torn cart  . Colonoscopy  06/10/2012    Procedure: COLONOSCOPY;  Surgeon: Corbin Ade, MD;  [facility];  Service: Endoscopy;  Laterality: N/A;  12:45 PM    FAMILY HISTORY: History reviewed. No pertinent family history.  SOCIAL HISTORY: History   Social History  . Marital Status: Married    Spouse Name: Diane [name]     Number of Children: 3  . Years of Education: 12+   Occupational History  .  [employer]   Social History Main Topics  . Smoking status: Current Every Day Smoker -- 0.25 packs/day for 30 years    Types: Cigarettes  . Smokeless tobacco: Never Used  . Alcohol Use: Yes     Comment: 3 per day  . Drug Use: No  . Sexual  Activity: Not on file   Other Topics Concern  . Not on file   Social History Narrative   The patient lives at home with husband Diane [name].    Patient has 3 children.    Patient has he Associates Degree.    Patient is currently working.      PHYSICAL EXAM  Filed Vitals:   09/08/13 1528  BP: 106/67  Pulse: 73  Height: 5\' 4"  (1.626 m)  Weight: 144 lb (65.318 kg)   Body mass index is 24.71 kg/(m^2).  Generalized: Well developed, in no acute distress  Neurological examination   Mentation: Alert oriented to time, place, history taking. Follows all commands speech and language fluent  Cranial nerve II-XII: Pupils were equal round reactive to light extraocular movements were full, visual field were full on confrontational test. Facial  sensation and strength were normal. hearing was intact to finger rubbing bilaterally. Uvula tongue midline. head turning and shoulder shrug and were normal and symmetric.Tongue protrusion into cheek strength was normal. Motor: normal bulk and tone, full strength in the BUE, BLE, fine finger movements normal, no pronator drift. No focal weakness Coordination: finger-nose-finger, heel-to-shin bilaterally, no dysmetria Reflexes: Brachioradialis 2/2, biceps 2/2, triceps 2/2, patellar 2/2, Achilles 2/2, plantar responses were flexor bilaterally. Gait and Station: Rising up from seated position without assistance, normal stance,  moderate stride, good arm swing, smooth turning, able to perform tiptoe, and heel walking without difficulty.   DIAGNOSTIC DATA (LABS, IMAGING, TESTING) - I reviewed patient records, labs, notes, testing and imaging myself where available.  Lab Results  Component Value Date   WBC 10.6* 10/06/2012   HGB 12.0 10/06/2012   HCT 34.9* 10/06/2012   MCV 95.6 10/06/2012   PLT 328 10/06/2012      Component Value Date/Time   NA 135 10/06/2012 2040   K 4.0 10/06/2012 2040   CL 97 10/06/2012 2040   CO2 25 10/06/2012 2040   GLUCOSE 99 10/06/2012 2040   BUN 11 10/06/2012 2040   CREATININE 0.65 10/06/2012 2040   CALCIUM 10.0 10/06/2012 2040   PROT 8.2 10/06/2012 2040   ALBUMIN 4.1 10/06/2012 2040   AST 22 10/06/2012 2040   ALT 17 10/06/2012 2040   ALKPHOS 80 10/06/2012 2040   BILITOT 0.3 10/06/2012 2040   GFRNONAA >90 10/06/2012 2040   GFRAA >90 10/06/2012 2040     ASSESSMENT AND PLAN  57 y.o. year old female  has a past medical history of seizure disorder here in followup. She is currently on Topamax 50 mg twice a day with last seizure occurring 10/06/2012.   Continue Topamax 50 mg twice daily Follow up yearly and when necessary Call for any seizure activity Nilda Riggs, [facility], [facility], APRN  [facility] [address], [address] [place],  [place] [address] [phone]

## 2013-10-16 DIAGNOSIS — A0472 Enterocolitis due to Clostridium difficile, not specified as recurrent: Secondary | ICD-10-CM

## 2013-10-16 HISTORY — DX: Enterocolitis due to Clostridium difficile, not specified as recurrent: A04.72

## 2013-12-23 ENCOUNTER — Other Ambulatory Visit: Payer: Self-pay

## 2013-12-23 MED ORDER — TOPIRAMATE 50 MG PO TABS: 50.0000 mg | ORAL_TABLET | Freq: Two times a day (BID) | ORAL | Status: DC

## 2014-07-23 ENCOUNTER — Other Ambulatory Visit: Payer: Self-pay | Admitting: Neurology

## 2014-08-13 ENCOUNTER — Encounter (HOSPITAL_COMMUNITY): Payer: Self-pay | Admitting: Emergency Medicine

## 2014-08-13 ENCOUNTER — Emergency Department (HOSPITAL_COMMUNITY): Payer: 59

## 2014-08-13 ENCOUNTER — Observation Stay (HOSPITAL_COMMUNITY)
Admission: EM | Admit: 2014-08-13 | Discharge: 2014-08-13 | Disposition: A | Discharge status: Home / Self Care | Payer: 59 | Attending: Internal Medicine | Admitting: Internal Medicine

## 2014-08-13 DIAGNOSIS — E780 Pure hypercholesterolemia: Secondary | ICD-10-CM | POA: Diagnosis not present

## 2014-08-13 DIAGNOSIS — J209 Acute bronchitis, unspecified: Secondary | ICD-10-CM | POA: Diagnosis present

## 2014-08-13 DIAGNOSIS — Z79899 Other long term (current) drug therapy: Secondary | ICD-10-CM | POA: Diagnosis not present

## 2014-08-13 DIAGNOSIS — M19049 Primary osteoarthritis, unspecified hand: Secondary | ICD-10-CM | POA: Diagnosis not present

## 2014-08-13 DIAGNOSIS — Z87891 Personal history of nicotine dependence: Secondary | ICD-10-CM | POA: Insufficient documentation

## 2014-08-13 DIAGNOSIS — Z888 Allergy status to other drugs, medicaments and biological substances status: Secondary | ICD-10-CM | POA: Diagnosis not present

## 2014-08-13 DIAGNOSIS — Z880 Allergy status to penicillin: Secondary | ICD-10-CM | POA: Diagnosis not present

## 2014-08-13 DIAGNOSIS — R0789 Other chest pain: Secondary | ICD-10-CM

## 2014-08-13 DIAGNOSIS — R079 Chest pain, unspecified: Secondary | ICD-10-CM | POA: Diagnosis present

## 2014-08-13 DIAGNOSIS — E785 Hyperlipidemia, unspecified: Secondary | ICD-10-CM | POA: Diagnosis present

## 2014-08-13 HISTORY — DX: Unspecified convulsions: R56.9

## 2014-08-13 HISTORY — DX: Enterocolitis due to Clostridium difficile, not specified as recurrent: A04.72

## 2014-08-13 LAB — CBC
HEMATOCRIT: 35.6 % — AB (ref 36.0–46.0)
HEMOGLOBIN: 12.1 g/dL (ref 12.0–15.0)
MCH: 32.4 pg (ref 26.0–34.0)
MCHC: 34 g/dL (ref 30.0–36.0)
MCV: 95.4 fL (ref 78.0–100.0)
Platelets: 306 10*3/uL (ref 150–400)
RBC: 3.73 MIL/uL — AB (ref 3.87–5.11)
RDW: 12.6 % (ref 11.5–15.5)
WBC: 6.4 10*3/uL (ref 4.0–10.5)

## 2014-08-13 LAB — I-STAT TROPONIN, ED: TROPONIN I, POC: 0 ng/mL (ref 0.00–0.08)

## 2014-08-13 LAB — HEPATIC FUNCTION PANEL
ALBUMIN: 3.5 g/dL (ref 3.5–5.2)
ALK PHOS: 75 U/L (ref 39–117)
ALT: 19 U/L (ref 0–35)
AST: 29 U/L (ref 0–37)
BILIRUBIN TOTAL: 0.3 mg/dL (ref 0.3–1.2)
Bilirubin, Direct: <0.2 mg/dL (ref 0.0–0.3)
TOTAL PROTEIN: 7.6 g/dL (ref 6.0–8.3)

## 2014-08-13 LAB — TROPONIN I
Troponin I: <0.3 ng/mL (ref <0.30)
Troponin I: <0.3 ng/mL (ref <0.30)
Troponin I: <0.3 ng/mL (ref <0.30)

## 2014-08-13 LAB — BASIC METABOLIC PANEL
Anion gap: 16 — ABNORMAL HIGH (ref 5–15)
BUN: 9 mg/dL (ref 6–23)
CALCIUM: 9.6 mg/dL (ref 8.4–10.5)
CO2: 25 meq/L (ref 19–32)
CREATININE: 0.49 mg/dL — AB (ref 0.50–1.10)
Chloride: 100 mEq/L (ref 96–112)
GFR calc Af Amer: >90 mL/min (ref >90)
GLUCOSE: 103 mg/dL — AB (ref 70–99)
Potassium: 5.2 mEq/L (ref 3.7–5.3)
Sodium: 141 mEq/L (ref 137–147)

## 2014-08-13 LAB — LIPASE, BLOOD: Lipase: 28 U/L (ref 11–59)

## 2014-08-13 MED ORDER — ACETAMINOPHEN 325 MG PO TABS: 650.0000 mg | ORAL_TABLET | ORAL | Status: DC | PRN

## 2014-08-13 MED ORDER — ONDANSETRON HCL 4 MG/2ML IJ SOLN: 4.0000 mg | Freq: Four times a day (QID) | INTRAMUSCULAR | Status: DC | PRN

## 2014-08-13 MED ORDER — ASPIRIN 325 MG PO TABS: 325.0000 mg | ORAL_TABLET | Freq: Every day | ORAL | Status: DC

## 2014-08-13 MED ORDER — BUPROPION HCL ER (SR) 150 MG PO TB12
150.0000 mg | ORAL_TABLET | Freq: Every day | ORAL | Status: DC
  Administered 2014-08-13: 150 mg via ORAL
  Filled 2014-08-13: qty 1

## 2014-08-13 MED ORDER — INFLUENZA VAC SPLIT QUAD 0.5 ML IM SUSY: 0.5000 mL | PREFILLED_SYRINGE | INTRAMUSCULAR | Status: DC

## 2014-08-13 MED ORDER — ESCITALOPRAM OXALATE 10 MG PO TABS
10.0000 mg | ORAL_TABLET | Freq: Every day | ORAL | Status: DC
  Administered 2014-08-13: 10 mg via ORAL
  Filled 2014-08-13: qty 1

## 2014-08-13 MED ORDER — FLUTICASONE-SALMETEROL 250-50 MCG/DOSE IN AEPB: 1.0000 | INHALATION_SPRAY | Freq: Two times a day (BID) | RESPIRATORY_TRACT | Status: DC

## 2014-08-13 MED ORDER — DOXYCYCLINE HYCLATE 100 MG PO CAPS: 100.0000 mg | ORAL_CAPSULE | Freq: Two times a day (BID) | ORAL | Status: DC

## 2014-08-13 MED ORDER — ASPIRIN EC 81 MG PO TBEC: 81.0000 mg | DELAYED_RELEASE_TABLET | Freq: Every day | ORAL | Status: DC

## 2014-08-13 MED ORDER — GUAIFENESIN-DM 100-10 MG/5ML PO SYRP: 5.0000 mL | ORAL_SOLUTION | ORAL | Status: DC | PRN

## 2014-08-13 MED ORDER — SIMVASTATIN 20 MG PO TABS
20.0000 mg | ORAL_TABLET | Freq: Every day | ORAL | Status: DC
  Filled 2014-08-13: qty 1

## 2014-08-13 MED ORDER — HEPARIN SODIUM (PORCINE) 5000 UNIT/ML IJ SOLN: 5000.0000 [IU] | Freq: Three times a day (TID) | INTRAMUSCULAR | Status: DC

## 2014-08-13 MED ORDER — MOMETASONE FURO-FORMOTEROL FUM 100-5 MCG/ACT IN AERO
2.0000 | INHALATION_SPRAY | Freq: Two times a day (BID) | RESPIRATORY_TRACT | Status: DC
  Administered 2014-08-13: 2 via RESPIRATORY_TRACT
  Filled 2014-08-13: qty 8.8

## 2014-08-13 NOTE — Progress Notes (Signed)
No bed available in room pt. To be admitted to. EVS notified. RN will notify ED RN when bed is ready for pt.

## 2014-08-13 NOTE — Progress Notes (Signed)
Patient admitted after midnight. Chart reviewed. Patient examined. No further discomfort. Workup unremarkable thus far. Never had stress test. Reasonable to do, given h/o smoking and hyperlipidemia.  However, chest/epigastric pain atypical. Pt would like to do as inpatient if needed. LFTs ok. Has had a cough for a few months, treated with abx, possibly levaquin as outpt. Ran out of advair. CXR shows mild interstitial prominence, but no definite infiltrate.  Doree Barthel, MD [facility] [phone]

## 2014-08-13 NOTE — Discharge Summary (Signed)
Physician Discharge Summary  Diane [name] KGM:[id number] DOB: 04-07-1956 DOA: 08/13/2014  PCP: Octavio Graves, DO  Admit date: 08/13/2014 Discharge date: 08/13/2014  Recommendations for Outpatient Follow-up:  Outpatient stress test to be arranged by Dr. Aundra Dubin  Discharge Diagnoses:  Principal Problem:   Chest pain on exertion Active Problems:   Hyperlipidemia   Acute bronchitis   Discharge Condition: stable  Filed Weights   08/13/14 0145 08/13/14 0730  Weight: 61.236 kg (135 lb) 62.4 kg (137 lb 9.1 oz)    History of present illness:  58 y.o. female h/o HLD, smoking, presents to ED with chest pain. Pain onset about 1 hour PTA, midsternal with radiation across chest. Stabbing in quality. Lasted minutes. Was postcoital. Reoccurred when walking to ambulance and again resolved. Currently pain free. Did have some associated SOB. States that it actually felt somewhat like previous gallbladder pain before cholecystectomy.  Hospital Course:  Observed on telemetry. MI ruled out. LFTs and lipase normal. Cardiology consulted and recommend outpatient stress test. No further discomfort, but upon further questioning, reports cough x1-2 months. No resolution after a course of antibiotics and rx for advair. May be contributing to chest pain. Will give a course of doxycycline for acute bronchitis. CXR without infiltrate.  Procedures:  none  Consultations:  [facility]  Discharge Exam: Filed Vitals:   08/13/14 1002  BP: 107/71  Pulse: 76  Temp:   Resp:     General: comfortable, occasional coughing spell Cardiovascular: RRR without MGR. No chest wall tenderness Respiratory: CTA without WRR Abd: s, nt, nd Ext no CCE. Homans negative and no calf tenderness   Discharge Instructions   Activity as tolerated - No restrictions    Complete by:  As directed      Diet - low sodium heart healthy    Complete by:  As directed           Current Discharge Medication  List    START taking these medications   Details  aspirin EC 81 MG tablet Take 1 tablet (81 mg total) by mouth daily.    doxycycline (VIBRAMYCIN) 100 MG capsule Take 1 capsule (100 mg total) by mouth 2 (two) times daily. Qty: 14 capsule, Refills: 0    Fluticasone-Salmeterol (ADVAIR DISKUS) 250-50 MCG/DOSE AEPB Inhale 1 puff into the lungs 2 (two) times daily. Qty: 60 each, Refills: 0      CONTINUE these medications which have NOT CHANGED   Details  buPROPion (WELLBUTRIN SR) 150 MG 12 hr tablet Take 150 mg by mouth daily.     cetirizine (ZYRTEC) 10 MG tablet Take 10 mg by mouth daily.    escitalopram (LEXAPRO) 10 MG tablet Take 10 mg by mouth daily.     Multiple Vitamin (MULTIVITAMIN WITH MINERALS) TABS Take 1 tablet by mouth daily.    simvastatin (ZOCOR) 20 MG tablet Take 20 mg by mouth at bedtime.       Allergies  Allergen Reactions  . Penicillins     Unknown type of reaction  . Keppra [Levetiracetam]     irritability   Follow-up Information   Follow up with Loralie Champagne, MD. (his office will call you to schedule stress test)    Specialty:  Cardiology   Contact information:   [address] [place] [address] [phone]       Follow up with Octavio Graves, DO. (as previously scheduled)    Contact information:   [address] [address] [phone]  The results of significant diagnostics from this hospitalization (including imaging, microbiology, ancillary and laboratory) are listed below for reference.    Significant Diagnostic Studies: Dg Chest 2 View  08/13/2014   CLINICAL DATA:  Right lower chest pain.  EXAM: CHEST  2 VIEW  COMPARISON:  06/27/2010  FINDINGS: Cardiomediastinal contours within normal range. Interstitial prominence. No confluent airspace opacity, pleural effusion, or pneumothorax. Surgical clips right upper quadrant. No acute osseous finding.  IMPRESSION: Mild interstitial prominence may reflect  chronic change versus atypical/viral infection if acute.   Electronically Signed   By: Carlos Levering M.D.   On: 08/13/2014 03:04   ekg Sinus rhythm Borderline short PR interval Abnormal R-wave progression, early transition Borderline T abnormalities, anterior leads  Microbiology: No results found for this or any previous visit (from the past 240 hour(s)).   Labs: Basic Metabolic Panel:  Recent Labs Lab 08/13/14 0144  NA 141  K 5.2  CL 100  CO2 25  GLUCOSE 103*  BUN 9  CREATININE 0.49*  CALCIUM 9.6   Liver Function Tests:  Recent Labs Lab 08/13/14 0456  AST 29  ALT 19  ALKPHOS 75  BILITOT 0.3  PROT 7.6  ALBUMIN 3.5    Recent Labs Lab 08/13/14 0832  LIPASE 28   No results found for this basename: AMMONIA,  in the last 168 hours CBC:  Recent Labs Lab 08/13/14 0144  WBC 6.4  HGB 12.1  HCT 35.6*  MCV 95.4  PLT 306   Cardiac Enzymes:  Recent Labs Lab 08/13/14 0456 08/13/14 0832  TROPONINI <0.30 <0.30   BNP: BNP (last 3 results) No results found for this basename: PROBNP,  in the last 8760 hours CBG: No results found for this basename: GLUCAP,  in the last 168 hours     Signed:  Novali Vollman L  [facility] 08/13/2014, 1:14 PM

## 2014-08-13 NOTE — ED Notes (Signed)
Floor RN refused to take report at this time due to a missing bed.  RN advised that we would place the patient in a bed from the floor that we have available in the ER but RN refused report and would have to call the [facility].    Charge RN in the ED made aware of the situation.

## 2014-08-13 NOTE — ED Notes (Signed)
Dr. Gardner at bedside 

## 2014-08-13 NOTE — Consult Note (Signed)
CARDIOLOGY CONSULT NOTE  Patient ID: Diane [name] MRN: [id number] DOB/AGE: 06/21/1956 58 y.o.  Admit date: 08/13/2014 Primary Cardiologist: New Reason for Consultation: Epigastric pain/lower chest pain  HPI: 58 yo with history of hyperlipidemia and no prior cardiac history was admitted last night with epigastric pain reminiscent of prior gallbladder pain.  She had had sex with her husband and was in bed shortly after when she developed severe aching in her epigstric area.  It resolved in 10 minutes.  She got up to walk around and again developed the severe epigastric aching, this time it wrapped around the sides of her torso  This resolved in 5 minutes.  She later had a 3rd episode for 5 minutes and decided to come to the ER. She has had no more significant epigastric pain though it has continued to feel a bit tight. The pain is like her gallbladder pain prior to cholecystectomy.  Cardiac enzymes negative, ECG not particularly changed from the past.  Risk factors are smoking and hyperlipidemia.  She just stopped smoking 10 days ago.  She has no history of exertional dyspnea or chest pain.   Review of systems complete and found to be negative unless listed above in HPI  Past Medical History: 1. H/o cholecystectomy 2. Hyperlipidemia 3. H/o C difficile diarrhea 4. H/o seizures 5. Hysterectomy 6. Appendectomy  FH:  Sister with lung cancer.  No premature CAD.  History   Social History  . Marital Status: Married    Spouse Name: [name]     Number of Children: 3  . Years of Education: 12+   Occupational History  .  [employer]   Social History Main Topics  . Smoking status: Former Smoker -- 1.00 packs/day for 30 years    Quit date: 08/03/2014  . Smokeless tobacco: Never Used  . Alcohol Use: Yes     Comment: 3 per day  . Drug Use: No  . Sexual Activity: Not on file   Other Topics Concern  . Not on file   Social History Narrative   The patient lives at home  with husband [name].    Patient has 3 children.    Patient has he Associates Degree.    Patient is currently working.      Prescriptions prior to admission  Medication Sig Dispense Refill  . buPROPion (WELLBUTRIN SR) 150 MG 12 hr tablet Take 150 mg by mouth daily.       . cetirizine (ZYRTEC) 10 MG tablet Take 10 mg by mouth daily.      Marland Kitchen escitalopram (LEXAPRO) 10 MG tablet Take 10 mg by mouth daily.       . Multiple Vitamin (MULTIVITAMIN WITH MINERALS) TABS Take 1 tablet by mouth daily.      . simvastatin (ZOCOR) 20 MG tablet Take 20 mg by mouth at bedtime.        Physical exam Blood pressure 107/71, pulse 76, temperature 97.9 F (36.6 C), temperature source Oral, resp. rate 18, height 5\' 6"  (1.676 m), weight 135 lb (61.236 kg), SpO2 97.00%. General: NAD Neck: No JVD, no thyromegaly or thyroid nodule.  Lungs: Occasional rhonchi CV: Nondisplaced PMI.  Heart regular S1/S2, no S3/S4, no murmur.  No peripheral edema.  No carotid bruit.  Normal pedal pulses.  Abdomen: Soft, nontender, no hepatosplenomegaly, no distention.  Skin: Intact without lesions or rashes.  Neurologic: Alert and oriented x 3.  Psych: Normal affect. Extremities: No clubbing or cyanosis.  HEENT: Normal.   Labs:  Lab Results  Component Value Date   WBC 6.4 08/13/2014   HGB 12.1 08/13/2014   HCT 35.6* 08/13/2014   MCV 95.4 08/13/2014   PLT 306 08/13/2014    Recent Labs Lab 08/13/14 0144 08/13/14 0456  NA 141  --   K 5.2  --   CL 100  --   CO2 25  --   BUN 9  --   CREATININE 0.49*  --   CALCIUM 9.6  --   PROT  --  7.6  BILITOT  --  0.3  ALKPHOS  --  75  ALT  --  19  AST  --  29  GLUCOSE 103*  --    Lab Results  Component Value Date   CKTOTAL 61 06/28/2010   CKMB 0.9 06/28/2010   TROPONINI <0.30 08/13/2014      Radiology: - CXR: Mild interstitial prominence  EKG: NSR, TWIs in V1 and V2.  These leads are not markedly different from ECG in 2013.   ASSESSMENT AND PLAN: 58 yo with  history of hyperlipidemia and no prior cardiac history was admitted last night with epigastric pain reminiscent of prior gallbladder pain. Symptoms have resolved.  Troponin negative x 3 and ECG without marked changes from prior.  Atypical symptoms.  I think that it would be safe to let her go home and followup for outpatient Cardiolite (would probably do Cardiolite over stress echo given smoking history which may make echo more difficult).  She should continue her simvastatin and start ASA 81 mg daily. Would continue to stay off cigarettes.  I will arrange the Cardiolite.   Loralie Champagne 08/13/2014

## 2014-08-13 NOTE — ED Notes (Signed)
Pt continues to deny pain since arrival to ED.

## 2014-08-13 NOTE — ED Notes (Signed)
Dr. Horton at bedside. 

## 2014-08-13 NOTE — Progress Notes (Signed)
UR completed 

## 2014-08-13 NOTE — ED Notes (Signed)
Attempted report 

## 2014-08-13 NOTE — H&P (Addendum)
[facility] History and Physical  Diane [name] XNA:[id number] DOB: [birth year] DOA: 08/13/2014  Referring physician: EDP PCP: Octavio Graves, DO   Chief Complaint: Chest pain   HPI: Diane [name] is a 58 y.o. female h/o HLD, smoking, presents to ED with chest pain.  Pain onset about 1 hour PTA, midsternal with radiation across chest.  Stabbing in quality.  Lasted minutes.  Was postcoital.  Reoccurred when walking to ambulance and again resolved.  Currently pain free.  Did have some associated SOB.  States that it actually felt somewhat like previous gallbladder pain before cholecystectomy.  Review of Systems: Systems reviewed.  As above, otherwise negative  Past Medical History  Diagnosis Date  . Pneumonia     5 years ago  . Headache(784.0)   . Arthritis     fingers  . Hypercholesterolemia   . C. difficile diarrhea   . Seizures    Past Surgical History  Procedure Laterality Date  . Abdominal hysterectomy    . Cholecystectomy    . Knee arthroscopy  1973    right knee,  torn cart  . Colonoscopy  06/10/2012    Procedure: COLONOSCOPY;  Surgeon: Daneil Dolin, MD;  [facility]: [facility];  Service: Endoscopy;  Laterality: N/A;  12:45 PM  . Appendectomy     Social History:  reports that she has quit smoking. She has never used smokeless tobacco. She reports that she drinks alcohol. She reports that she does not use illicit drugs. ("quit" this week).  Allergies  Allergen Reactions  . Penicillins     Unknown type of reaction  . Keppra [Levetiracetam]     irritability    No family history on file.   Prior to Admission medications   Medication Sig Start Date End Date Taking? Authorizing Provider  buPROPion (WELLBUTRIN SR) 150 MG 12 hr tablet Take 150 mg by mouth daily.    Yes Historical Provider, MD  cetirizine (ZYRTEC) 10 MG tablet Take 10 mg by mouth daily.   Yes Historical Provider, MD  escitalopram (LEXAPRO) 10 MG tablet Take 10 mg by mouth daily.   02/28/13  Yes Historical Provider, MD  Multiple Vitamin (MULTIVITAMIN WITH MINERALS) TABS Take 1 tablet by mouth daily.   Yes Historical Provider, MD  simvastatin (ZOCOR) 20 MG tablet Take 20 mg by mouth at bedtime.   Yes Historical Provider, MD   Physical Exam: Filed Vitals:   08/13/14 0430  BP: 101/64  Pulse: 73  Temp:   Resp: 18    BP 101/64  Pulse 73  Temp(Src) 97.9 F (36.6 C) (Oral)  Resp 18  Ht 5\' 6"  (1.676 m)  Wt 61.236 kg (135 lb)  BMI 21.80 kg/m2  SpO2 99%  General Appearance:    Alert, oriented, no distress, appears stated age  Head:    Normocephalic, atraumatic  Eyes:    PERRL, EOMI, sclera non-icteric        Nose:   Nares without drainage or epistaxis. Mucosa, turbinates normal  Throat:   Moist mucous membranes. Oropharynx without erythema or exudate.  Neck:   Supple. No carotid bruits.  No thyromegaly.  No lymphadenopathy.   Back:     No CVA tenderness, no spinal tenderness  Lungs:     Clear to auscultation bilaterally, without wheezes, rhonchi or rales  Chest wall:    No tenderness to palpitation  Heart:    Regular rate and rhythm without murmurs, gallops, rubs  Abdomen:     Soft, non-tender, nondistended, normal bowel  sounds, no organomegaly  Genitalia:    deferred  Rectal:    deferred  Extremities:   No clubbing, cyanosis or edema.  Pulses:   2+ and symmetric all extremities  Skin:   Skin color, texture, turgor normal, no rashes or lesions  Lymph nodes:   Cervical, supraclavicular, and axillary nodes normal  Neurologic:   CNII-XII intact. Normal strength, sensation and reflexes      throughout    Labs on Admission:  Basic Metabolic Panel:  Recent Labs Lab 08/13/14 0144  NA 141  K 5.2  CL 100  CO2 25  GLUCOSE 103*  BUN 9  CREATININE 0.49*  CALCIUM 9.6   Liver Function Tests: No results found for this basename: AST, ALT, ALKPHOS, BILITOT, PROT, ALBUMIN,  in the last 168 hours No results found for this basename: LIPASE, AMYLASE,  in the last  168 hours No results found for this basename: AMMONIA,  in the last 168 hours CBC:  Recent Labs Lab 08/13/14 0144  WBC 6.4  HGB 12.1  HCT 35.6*  MCV 95.4  PLT 306   Cardiac Enzymes: No results found for this basename: CKTOTAL, CKMB, CKMBINDEX, TROPONINI,  in the last 168 hours  BNP (last 3 results) No results found for this basename: PROBNP,  in the last 8760 hours CBG: No results found for this basename: GLUCAP,  in the last 168 hours  Radiological Exams on Admission: Dg Chest 2 View  08/13/2014   CLINICAL DATA:  Right lower chest pain.  EXAM: CHEST  2 VIEW  COMPARISON:  06/27/2010  FINDINGS: Cardiomediastinal contours within normal range. Interstitial prominence. No confluent airspace opacity, pleural effusion, or pneumothorax. Surgical clips right upper quadrant. No acute osseous finding.  IMPRESSION: Mild interstitial prominence may reflect chronic change versus atypical/viral infection if acute.   Electronically Signed   By: Carlos Levering M.D.   On: 08/13/2014 03:04    EKG: Independently reviewed. Non-specific T wave changes, similar to priors.  Assessment/Plan Principal Problem:   Chest pain on exertion   1. Chest pain on exertion 1. Check LFTs given the similarity to similar gallbladder colic prior to cholecystectomy in past. 2. Serial trops 3. Chest pain obs protocol 4. ASA 325 given in ambulance 5. HEART score of 4 6. Likely will need stress test though I suspect cards may elect to do this as outpatient. 7. Tele monitor    Code Status: Full Code  Family Communication: Husband at bedside Disposition Plan: Admit to obs   Time spent: 50 min  GARDNER, JARED M. [facility] Pager [phone]  If 7AM-7PM, please contact the day team taking care of the patient [email or web address] Password [email or web address] 08/13/2014, 4:56 AM

## 2014-08-13 NOTE — ED Provider Notes (Signed)
CSN: [id number]     Arrival date & time 08/13/14  0139 History   First MD Initiated Contact with Patient 08/13/14 0149     Chief Complaint  Patient presents with  . Chest Pain     (Consider location/radiation/quality/duration/timing/severity/associated sxs/prior Treatment) HPI  This is a 58 year old female with a history of hypercholesterolemia, smoking who presents with chest pain. Patient reports onset of chest pain approximately one hour prior to arrival. It was midsternal. It was postcoital. Patient states it felt like "a knife stabbing me. It lasted minutes. It subsided prior to EMS arrival. However, upon ambulation to the ambulance, patient reported return of pain. She is currently pain-free. She endorses associated shortness of breath but no diaphoresis. She was given full dose aspirin.  Patient states that it feels somewhat like when she had to have her gallbladder out.  Patient denies any association with food.  Past Medical History  Diagnosis Date  . Pneumonia     5 years ago  . Headache(784.0)   . Arthritis     fingers  . Hypercholesterolemia   . C. difficile diarrhea   . Seizures    Past Surgical History  Procedure Laterality Date  . Abdominal hysterectomy    . Cholecystectomy    . Knee arthroscopy  1973    right knee,  torn cart  . Colonoscopy  06/10/2012    Procedure: COLONOSCOPY;  Surgeon: Daneil Dolin, MD;  Location: [facility];  Service: Endoscopy;  Laterality: N/A;  12:45 PM  . Appendectomy     No family history on file. History  Substance Use Topics  . Smoking status: Former Smoker -- 0.00 packs/day for 30 years  . Smokeless tobacco: Never Used  . Alcohol Use: Yes     Comment: 3 per day   OB History   Grav Para Term Preterm Abortions TAB SAB Ect Mult Living                 Review of Systems  Constitutional: Negative for fever.  Respiratory: Positive for chest tightness and shortness of breath.   Cardiovascular: Positive for chest pain.  Negative for leg swelling.  Gastrointestinal: Negative for nausea, vomiting and abdominal pain.  Genitourinary: Negative for dysuria.  Musculoskeletal: Negative for back pain.  Neurological: Negative for headaches.  Psychiatric/Behavioral: Negative for confusion.  All other systems reviewed and are negative.     Allergies  Penicillins and Keppra  Home Medications   Prior to Admission medications   Medication Sig Start Date End Date Taking? Authorizing Provider  buPROPion (WELLBUTRIN SR) 150 MG 12 hr tablet Take 150 mg by mouth daily.    Yes Historical Provider, MD  cetirizine (ZYRTEC) 10 MG tablet Take 10 mg by mouth daily.   Yes Historical Provider, MD  escitalopram (LEXAPRO) 10 MG tablet Take 10 mg by mouth daily.  02/28/13  Yes Historical Provider, MD  Multiple Vitamin (MULTIVITAMIN WITH MINERALS) TABS Take 1 tablet by mouth daily.   Yes Historical Provider, MD  simvastatin (ZOCOR) 20 MG tablet Take 20 mg by mouth at bedtime.   Yes Historical Provider, MD   BP 114/78  Pulse 70  Temp(Src) 98.1 F (36.7 C) (Oral)  Resp 14  Ht 5\' 6"  (1.676 m)  Wt 135 lb (61.236 kg)  BMI 21.80 kg/m2  SpO2 95% Physical Exam  Nursing note and vitals reviewed. Constitutional: She is oriented to person, place, and time. She appears well-developed and well-nourished. No distress.  HENT:  Head: Normocephalic and  atraumatic.  Cardiovascular: Normal rate, regular rhythm and normal heart sounds.   No murmur heard. Pulmonary/Chest: Effort normal and breath sounds normal. No respiratory distress. She has no wheezes.  Abdominal: Soft. There is no tenderness.  Musculoskeletal: She exhibits no edema.  Neurological: She is alert and oriented to person, place, and time.  Skin: Skin is warm and dry.  Psychiatric: She has a normal mood and affect.    ED Course  Procedures (including critical care time) Labs Review Labs Reviewed  CBC - Abnormal; Notable for the following:    RBC 3.73 (*)    HCT 35.6  (*)    All other components within normal limits  BASIC METABOLIC PANEL - Abnormal; Notable for the following:    Glucose, Bld 103 (*)    Creatinine, Ser 0.49 (*)    Anion gap 16 (*)    All other components within normal limits  I-STAT TROPOININ, ED    Imaging Review No results found.   EKG Interpretation   Date/Time:  Thursday August 13 2014 01:44:48 EDT Ventricular Rate:  67 PR Interval:  112 QRS Duration: 90 QT Interval:  418 QTC Calculation: 441 R Axis:   73 Text Interpretation:  Sinus rhythm Borderline short PR interval Abnormal  R-wave progression, early transition Borderline T abnormalities, anterior  leads Similar to prior Confirmed by HORTON  MD, [place] ([id number]) on  08/13/2014 2:35:45 AM      MDM   Final diagnoses:  Chest pain    Patient presents with chest pain. Currently chest pain-free. Received full dose aspirin in route. Pain was in the setting of exertion and sex. Patient does have risk factors for heart disease including high cholesterol and smoking. EKG shows nonspecific T-wave abnormalities which are similar to prior. No evidence of acute ischemia. Heart score of 4 for story, age, and risk factors.  Workup is largely reassuring including initial troponin. Chest x-ray does show possible chronic versus viral infection; however, do not feel this fits with the patient's story. Given heart score, will admit for serial enzymes.      Merryl Hacker, MD 08/13/14 [phone]

## 2014-08-13 NOTE — ED Notes (Signed)
Pt arrives from [facility] with new onset intermittent CP, presently not in pain at triage. EMS gave 324 MG aspirin PTA, no nitro. While with EMS, pain has come and gone three times. 18 G IV palced in L AC. Pain located laterally across diaphragm. Last set VS141/71, 94% 4L, HR 68.

## 2014-08-13 NOTE — Progress Notes (Signed)
DC IV, DC Tele, DC Home. Discharge instructions and home medications discussed with patient and patient's family member. Patient and family member denied any questions or concerns at this time. Patient leaving unit via wheelchair and appears in no acute distress.

## 2014-08-16 DIAGNOSIS — J209 Acute bronchitis, unspecified: Secondary | ICD-10-CM | POA: Diagnosis present

## 2014-08-16 DIAGNOSIS — E785 Hyperlipidemia, unspecified: Secondary | ICD-10-CM | POA: Diagnosis present

## 2014-08-28 ENCOUNTER — Emergency Department (HOSPITAL_COMMUNITY): Payer: 59

## 2014-08-28 ENCOUNTER — Encounter (HOSPITAL_COMMUNITY): Payer: Self-pay | Admitting: Emergency Medicine

## 2014-08-28 ENCOUNTER — Emergency Department (HOSPITAL_COMMUNITY)
Admission: EM | Admit: 2014-08-28 | Discharge: 2014-08-28 | Disposition: A | Discharge status: Home / Self Care | Payer: 59 | Attending: Emergency Medicine | Admitting: Emergency Medicine

## 2014-08-28 DIAGNOSIS — Z8619 Personal history of other infectious and parasitic diseases: Secondary | ICD-10-CM | POA: Insufficient documentation

## 2014-08-28 DIAGNOSIS — Z7951 Long term (current) use of inhaled steroids: Secondary | ICD-10-CM | POA: Insufficient documentation

## 2014-08-28 DIAGNOSIS — Z7982 Long term (current) use of aspirin: Secondary | ICD-10-CM | POA: Insufficient documentation

## 2014-08-28 DIAGNOSIS — R42 Dizziness and giddiness: Secondary | ICD-10-CM | POA: Diagnosis not present

## 2014-08-28 DIAGNOSIS — E780 Pure hypercholesterolemia: Secondary | ICD-10-CM | POA: Insufficient documentation

## 2014-08-28 DIAGNOSIS — R110 Nausea: Secondary | ICD-10-CM | POA: Diagnosis not present

## 2014-08-28 DIAGNOSIS — R072 Precordial pain: Secondary | ICD-10-CM | POA: Diagnosis present

## 2014-08-28 DIAGNOSIS — Z880 Allergy status to penicillin: Secondary | ICD-10-CM | POA: Insufficient documentation

## 2014-08-28 DIAGNOSIS — Z79899 Other long term (current) drug therapy: Secondary | ICD-10-CM | POA: Insufficient documentation

## 2014-08-28 DIAGNOSIS — R51 Headache: Secondary | ICD-10-CM | POA: Insufficient documentation

## 2014-08-28 DIAGNOSIS — Z792 Long term (current) use of antibiotics: Secondary | ICD-10-CM | POA: Insufficient documentation

## 2014-08-28 DIAGNOSIS — Z8701 Personal history of pneumonia (recurrent): Secondary | ICD-10-CM | POA: Insufficient documentation

## 2014-08-28 DIAGNOSIS — Z87891 Personal history of nicotine dependence: Secondary | ICD-10-CM | POA: Insufficient documentation

## 2014-08-28 DIAGNOSIS — R079 Chest pain, unspecified: Secondary | ICD-10-CM

## 2014-08-28 LAB — BASIC METABOLIC PANEL
Anion gap: 14 (ref 5–15)
BUN: 17 mg/dL (ref 6–23)
CO2: 25 meq/L (ref 19–32)
CREATININE: 0.58 mg/dL (ref 0.50–1.10)
Calcium: 9.3 mg/dL (ref 8.4–10.5)
Chloride: 101 mEq/L (ref 96–112)
GFR calc Af Amer: >90 mL/min (ref >90)
GLUCOSE: 106 mg/dL — AB (ref 70–99)
Potassium: 3.8 mEq/L (ref 3.7–5.3)
Sodium: 140 mEq/L (ref 137–147)

## 2014-08-28 LAB — CBC
HCT: 38.9 % (ref 36.0–46.0)
HEMOGLOBIN: 12.9 g/dL (ref 12.0–15.0)
MCH: 32.2 pg (ref 26.0–34.0)
MCHC: 33.2 g/dL (ref 30.0–36.0)
MCV: 97 fL (ref 78.0–100.0)
Platelets: 383 10*3/uL (ref 150–400)
RBC: 4.01 MIL/uL (ref 3.87–5.11)
RDW: 12.8 % (ref 11.5–15.5)
WBC: 8.4 10*3/uL (ref 4.0–10.5)

## 2014-08-28 LAB — I-STAT TROPONIN, ED
Troponin i, poc: 0 ng/mL (ref 0.00–0.08)
Troponin i, poc: 0 ng/mL (ref 0.00–0.08)

## 2014-08-28 MED ORDER — IOHEXOL 350 MG/ML SOLN
100.0000 mL | Freq: Once | INTRAVENOUS | Status: AC | PRN
  Administered 2014-08-28: 100 mL via INTRAVENOUS

## 2014-08-28 NOTE — ED Provider Notes (Signed)
Complains of anterior chest pain radiating to back started 2 hours prior to coming here. Patient had similar symptoms proximal to 2 weeks ago seen here did not follow-up outpatient cardiology visit as suggested. She is presently asymptomatic. On exam no distress lungs clear auscultation heart regular rate and rhythm extremities without edema  Orlie Dakin, MD 08/29/14 0005

## 2014-08-28 NOTE — ED Provider Notes (Signed)
CSN: [id number]     Arrival date & time 08/28/14  1558 History   First MD Initiated Contact with Patient 08/28/14 1810     Chief Complaint  Patient presents with  . Chest Pain     (Consider location/radiation/quality/duration/timing/severity/associated sxs/prior Treatment) Patient is a 58 y.o. female presenting with chest pain. The history is provided by the patient. No language interpreter was used.  Chest Pain Pain location:  Substernal area Pain quality: dull   Pain quality: not tearing   Pain radiates to:  Mid back (right breast) Pain radiates to the back: yes   Pain severity:  Moderate Onset quality:  Sudden Timing:  Intermittent Progression:  Waxing and waning (episodes 1-2 minutes in duration) Chronicity:  Recurrent Context: at rest   Relieved by:  Nothing Worsened by:  Nothing tried Ineffective treatments:  None tried Associated symptoms: headache and nausea   Associated symptoms: no abdominal pain, no cough, no fever, no orthopnea, no palpitations, no PND, no shortness of breath, no syncope and not vomiting   Risk factors: high cholesterol and smoking     Past Medical History  Diagnosis Date  . Pneumonia     5 years ago  . Headache(784.0)   . Arthritis     fingers  . Hypercholesterolemia   . C. difficile diarrhea   . Seizures    Past Surgical History  Procedure Laterality Date  . Abdominal hysterectomy    . Cholecystectomy    . Knee arthroscopy  1973    right knee,  torn cart  . Colonoscopy  06/10/2012    Procedure: COLONOSCOPY;  Surgeon: Daneil Dolin, MD;  Location: [facility];  Service: Endoscopy;  Laterality: N/A;  12:45 PM  . Appendectomy    . Cesarean section     No family history on file. History  Substance Use Topics  . Smoking status: Former Smoker -- 1.00 packs/day for 30 years    Quit date: 08/03/2014  . Smokeless tobacco: Never Used  . Alcohol Use: Yes     Comment: 3 per day   OB History    No data available     Review of  Systems  Constitutional: Negative for fever.  HENT: Negative for congestion, rhinorrhea and sore throat.   Respiratory: Negative for cough and shortness of breath.   Cardiovascular: Positive for chest pain. Negative for palpitations, orthopnea, syncope and PND.  Gastrointestinal: Positive for nausea. Negative for vomiting, abdominal pain and diarrhea.  Genitourinary: Negative for dysuria and hematuria.  Skin: Negative for rash.  Neurological: Positive for light-headedness and headaches. Negative for syncope.  All other systems reviewed and are negative.     Allergies  Penicillins and Keppra  Home Medications   Prior to Admission medications   Medication Sig Start Date End Date Taking? Authorizing Provider  aspirin EC 81 MG tablet Take 1 tablet (81 mg total) by mouth daily. 08/13/14  Yes Delfina Redwood, MD  buPROPion [facility] SR) 150 MG 12 hr tablet Take 150 mg by mouth daily.    Yes Historical Provider, MD  cetirizine (ZYRTEC) 10 MG tablet Take 10 mg by mouth at bedtime.    Yes Historical Provider, MD  escitalopram (LEXAPRO) 10 MG tablet Take 10 mg by mouth daily.  02/28/13  Yes Historical Provider, MD  Fluticasone-Salmeterol (ADVAIR DISKUS) 250-50 MCG/DOSE AEPB Inhale 1 puff into the lungs 2 (two) times daily. 08/13/14  Yes Delfina Redwood, MD  ibuprofen (ADVIL,MOTRIN) 200 MG tablet Take 400 mg by mouth  every 6 (six) hours as needed for headache.   Yes Historical Provider, MD  Multiple Vitamin (MULTIVITAMIN WITH MINERALS) TABS Take 1 tablet by mouth daily.   Yes Historical Provider, MD  simvastatin (ZOCOR) 20 MG tablet Take 20 mg by mouth at bedtime.   Yes Historical Provider, MD  doxycycline (VIBRAMYCIN) 100 MG capsule Take 1 capsule (100 mg total) by mouth 2 (two) times daily. 08/13/14   Delfina Redwood, MD   BP 127/67 mmHg  Pulse 75  Temp(Src) 98.4 F (36.9 C)  Resp 18  SpO2 99% Physical Exam  Constitutional: She is oriented to person, place, and time. She  appears well-developed and well-nourished.  HENT:  Head: Normocephalic and atraumatic.  Right Ear: External ear normal.  Left Ear: External ear normal.  Eyes: EOM are normal.  Neck: Normal range of motion. Neck supple.  Cardiovascular: Normal rate, regular rhythm, normal heart sounds and intact distal pulses.  Exam reveals no gallop and no friction rub.   No murmur heard. BP R arm: 130/71, L arm 141/65  Pulmonary/Chest: Effort normal and breath sounds normal. No respiratory distress. She has no wheezes. She has no rales. She exhibits no tenderness.  Abdominal: Soft. Bowel sounds are normal. She exhibits no distension. There is no tenderness. There is no rebound.  Musculoskeletal: Normal range of motion. She exhibits no edema or tenderness.  Lymphadenopathy:    She has no cervical adenopathy.  Neurological: She is alert and oriented to person, place, and time.  Normal speech and cognition  Skin: Skin is warm. No rash noted.  Psychiatric: She has a normal mood and affect. Her behavior is normal.  Nursing note and vitals reviewed.   ED Course  Procedures (including critical care time) Labs Review Labs Reviewed  BASIC METABOLIC PANEL - Abnormal; Notable for the following:    Glucose, Bld 106 (*)    All other components within normal limits  CBC  I-STAT TROPOININ, ED  Randolm Idol, ED    Imaging Review Dg Chest 2 View  08/28/2014   CLINICAL DATA:  Chest pain  EXAM: CHEST  2 VIEW  COMPARISON:  08/13/2014  FINDINGS: Cardiac shadow is stable. Diffuse interstitial changes are noted. No focal infiltrate or sizable effusion is seen. No acute bony abnormality is noted.  IMPRESSION: Chronic interstitial changes without acute abnormality.   Electronically Signed   By: Inez Catalina M.D.   On: 08/28/2014 18:18   Ct Angio Chest Aorta W/cm &/or Wo/cm  08/28/2014   CLINICAL DATA:  Mid chest pain radiating in the back, question aortic dissection  EXAM: CT ANGIOGRAPHY CHEST WITH CONTRAST   TECHNIQUE: Multidetector CT imaging of the chest was performed using the standard protocol during bolus administration of intravenous contrast. Multiplanar CT image reconstructions and MIPs were obtained to evaluate the vascular anatomy.  CONTRAST:  181mL OMNIPAQUE IOHEXOL 350 MG/ML SOLN  COMPARISON:  None.  FINDINGS: Unenhanced images shows atherosclerotic calcifications of thoracic aorta. There are atherosclerotic calcifications of upper abdominal aorta.  Sagittal images of the spine shows degenerative changes thoracic spine.  There is no evidence of aortic aneurysm or dissection. Ascending aorta measures 2.8 cm in diameter. Descending aorta measures 2.3 cm in diameter. Heart size within normal limits. No pericardial effusion is noted.  No pulmonary embolus is identified. There is no mediastinal hematoma or adenopathy.  Status post cholecystectomy.  No hilar adenopathy is noted.  Images of the lung parenchyma shows bilateral emphysematous changes. Centrilobular and paraseptal emphysematous bullae are noted bilaterally.  There are peripheral fibrotic changes with paraseptal emphysematous bullae bilateral lower lobe posteriorly.  No acute infiltrate or pulmonary edema. There is a nodule along the left fissure in left lower lobe measures 5 mm.  Review of the MIP images confirms the above findings.  IMPRESSION: 1. There is no aortic aneurysm or aortic dissection. 2. No mediastinal hematoma or adenopathy. 3. Bilateral emphysematous changes are noted. Some fibrotic changes with peripheral honeycombing noted bilateral lower lobe posteriorly. 4. There is a 5 mm nodule in left lower lobe just posterior to the fissure. If the patient is at high risk for bronchogenic carcinoma, follow-up chest CT at 6-12 months is recommended. If the patient is at low risk for bronchogenic carcinoma, follow-up chest CT at 12 months is recommended. This recommendation follows the consensus statement: Guidelines for Management of Small  Pulmonary Nodules Detected on CT Scans: A Statement from the [employer] as published in Radiology 2005;237:395-400. 5. Status postcholecystectomy.   Electronically Signed   By: Lahoma Crocker M.D.   On: 08/28/2014 21:16     EKG Interpretation   Date/Time:  Friday August 28 2014 16:02:24 EST Ventricular Rate:  68 PR Interval:  120 QRS Duration: 92 QT Interval:  430 QTC Calculation: 457 R Axis:   73 Text Interpretation:  Normal sinus rhythm Normal ECG No significant change  since last tracing Confirmed by Winfred Leeds  MD, SAM [phone]) on 08/28/2014  5:54:01 PM      MDM   Final diagnoses:  Precordial pain    6:19 PM Pt is a 58 y.o. female with pertinent PMHX of HLD previous smoking who presents to the ED with substernal chest pain with radiation to mid back and right breast. No shortness of breath. No tearing. somehwat similar to previous episode. Admitted. Has not had stress test. No previous provocative testing. No worsening or reliving factors. No fevers or recent illness. No previous DVt or Pe. No family hsitory of premature CAd or sudden death. No immoblization. Low risk for PE  On exam: BP R arm 130/71,L arm 141/65. Normal distal pulses. Non reproducible chest pain. HEART score 3. Concern for possible dissection. Plan for delta troponin and CTA chest  CXR Pa/LATper my read for chest pain showed no focal consolidation no cardiolmegaly. No vascular congestion. No evidence of widened mediastinum  EKG personally reviewed by myself showed NSR Rate of 68, PR 160ms, QRS 14ms QT/QTC 430/419ms, normal axis, without evidence of new ischemia. Comparison showed similar, indication: chest pain  Review of labs: CBC: no leukocytosis, H&H 12.9/38.9 BMP: no electrolyte abnormalities istat troponin: 0.00  Plan to obtain CT angio of the the chest to rule out aortic dissection given patient endorsing chest pain with radiation to back.  CTA showed no evidence of aortic aneurysm or  dissection. Delta troponin negative. Plan for discharge. Patient to keep follow up with cardiology outpatient for stress test. Strict return precautions given. No tachycardia or hypoxia. Symptoms not consistent with PE. No infectious symptoms   9:56 PM:  I have discussed the diagnosis/risks/treatment options with the patient and believe the pt to be eligible for discharge home to follow-up with Cardiology for outpatient stress. We also discussed returning to the ED immediately if new or worsening sx occur. We discussed the sx which are most concerning (e.g., worsening symptoms) that necessitate immediate return. Any new prescriptions provided to the patient are listed below.   New Prescriptions   No medications on file    The patient appears reasonably screened and/or stabilized  for discharge and I doubt any other medical condition or other [facility] requiring further screening, evaluation or treatment in the ED at this time prior to discharge . Pt in agreement with discharge plan. Return precautions given. Pt discharged VSS   Labs, EKG and imaging reviewed by myself and considered in medical decision making if ordered.  Imaging interpreted by radiology. Pt was discussed with my attending, Dr. Marijo Conception, MD 08/29/14 [place], MD 08/29/14 [phone]

## 2014-08-28 NOTE — ED Notes (Signed)
Patient returned from CT

## 2014-08-28 NOTE — ED Notes (Signed)
Pt reports cp onset 2 hours ago radiating into back. Pt having nausea, and headache as well.

## 2014-08-28 NOTE — Discharge Instructions (Signed)
1. Come back if worsening symptoms 2. Call your cardiologist to get follow up for stress test 3. See PCP as needed Chest Pain (Nonspecific) It is often hard to give a specific diagnosis for the cause of chest pain. There is always a chance that your pain could be related to something serious, such as a heart attack or a blood clot in the lungs. You need to follow up with your health care provider for further evaluation. CAUSES   Heartburn.  Pneumonia or bronchitis.  Anxiety or stress.  Inflammation around your heart (pericarditis) or lung (pleuritis or pleurisy).  A blood clot in the lung.  A collapsed lung (pneumothorax). It can develop suddenly on its own (spontaneous pneumothorax) or from trauma to the chest.  Shingles infection (herpes zoster virus). The chest wall is composed of bones, muscles, and cartilage. Any of these can be the source of the pain.  The bones can be bruised by injury.  The muscles or cartilage can be strained by coughing or overwork.  The cartilage can be affected by inflammation and become sore (costochondritis). DIAGNOSIS  Lab tests or other studies may be needed to find the cause of your pain. Your health care provider may have you take a test called an ambulatory electrocardiogram (ECG). An ECG records your heartbeat patterns over a 24-hour period. You may also have other tests, such as:  Transthoracic echocardiogram (TTE). During echocardiography, sound waves are used to evaluate how blood flows through your heart.  Transesophageal echocardiogram (TEE).  Cardiac monitoring. This allows your health care provider to monitor your heart rate and rhythm in real time.  Holter monitor. This is a portable device that records your heartbeat and can help diagnose heart arrhythmias. It allows your health care provider to track your heart activity for several days, if needed.  Stress tests by exercise or by giving medicine that makes the heart beat  faster. TREATMENT   Treatment depends on what may be causing your chest pain. Treatment may include:  Acid blockers for heartburn.  Anti-inflammatory medicine.  Pain medicine for inflammatory conditions.  Antibiotics if an infection is present.  You may be advised to change lifestyle habits. This includes stopping smoking and avoiding alcohol, caffeine, and chocolate.  You may be advised to keep your head raised (elevated) when sleeping. This reduces the chance of acid going backward from your stomach into your esophagus. Most of the time, nonspecific chest pain will improve within 2-3 days with rest and mild pain medicine.  HOME CARE INSTRUCTIONS   If antibiotics were prescribed, take them as directed. Finish them even if you start to feel better.  For the next few days, avoid physical activities that bring on chest pain. Continue physical activities as directed.  Do not use any tobacco products, including cigarettes, chewing tobacco, or electronic cigarettes.  Avoid drinking alcohol.  Only take medicine as directed by your health care provider.  Follow your health care provider's suggestions for further testing if your chest pain does not go away.  Keep any follow-up appointments you made. If you do not go to an appointment, you could develop lasting (chronic) problems with pain. If there is any problem keeping an appointment, call to reschedule. SEEK MEDICAL CARE IF:   Your chest pain does not go away, even after treatment.  You have a rash with blisters on your chest.  You have a fever. SEEK IMMEDIATE MEDICAL CARE IF:   You have increased chest pain or pain that spreads to  your arm, neck, jaw, back, or abdomen.  You have shortness of breath.  You have an increasing cough, or you cough up blood.  You have severe back or abdominal pain.  You feel nauseous or vomit.  You have severe weakness.  You faint.  You have chills. This is an emergency. Do not wait to  see if the pain will go away. Get medical help at once. Call your local emergency services (911 in [place] Do not drive yourself to the hospital. MAKE SURE YOU:   Understand these instructions.  Will watch your condition.  Will get help right away if you are not doing well or get worse. Document Released: 07/12/2005 Document Revised: 10/07/2013 Document Reviewed: 05/07/2008 [facility] Patient Information 2015 [place], [place]. This information is not intended to replace advice given to you by your health care provider. Make sure you discuss any questions you have with your health care provider.

## 2014-09-01 ENCOUNTER — Ambulatory Visit: Payer: Self-pay | Admitting: Adult Health

## 2014-10-28 ENCOUNTER — Other Ambulatory Visit: Payer: Self-pay | Admitting: Neurology

## 2015-03-25 ENCOUNTER — Telehealth: Payer: Self-pay | Admitting: Gastroenterology

## 2015-03-25 ENCOUNTER — Ambulatory Visit: Payer: Self-pay | Admitting: Gastroenterology

## 2015-03-25 ENCOUNTER — Encounter: Payer: Self-pay | Admitting: Gastroenterology

## 2015-03-25 NOTE — Telephone Encounter (Signed)
Pt was a no show

## 2015-03-25 NOTE — Telephone Encounter (Signed)
Mailed letter and referring doctor is aware

## 2015-03-29 ENCOUNTER — Ambulatory Visit: Payer: Self-pay | Admitting: Gastroenterology

## 2015-03-29 ENCOUNTER — Ambulatory Visit: Payer: Self-pay | Admitting: Physical Therapy

## 2015-04-13 ENCOUNTER — Encounter: Payer: Self-pay | Admitting: Nurse Practitioner

## 2015-04-13 ENCOUNTER — Ambulatory Visit (INDEPENDENT_AMBULATORY_CARE_PROVIDER_SITE_OTHER): Payer: 59 | Admitting: Nurse Practitioner

## 2015-04-13 VITALS — BP 121/72 | HR 85 | Temp 97.4°F | Ht 66.0 in | Wt 142.4 lb

## 2015-04-13 DIAGNOSIS — A048 Other specified bacterial intestinal infections: Secondary | ICD-10-CM

## 2015-04-13 DIAGNOSIS — B9681 Helicobacter pylori [H. pylori] as the cause of diseases classified elsewhere: Secondary | ICD-10-CM

## 2015-04-13 NOTE — Progress Notes (Signed)
Referring Provider: Octavio Graves, DO Primary Care Physician:  Octavio Graves, DO Primary GI:  Dr. Gala Romney  Chief Complaint  Patient presents with  . H Pylori    HPI:   59 year old female presents on follow-up from PCP and emergency room. Patient presented to [facility] ER on 03/04/2015 with epigastric pain for 1 week radiating into right upper quadrant, upper chest pain, fullness/dyspepsia. Was diagnosed with H. pylori infection based on serum antibodies. Was prescribed Flagyl for 14 days , Lansoprazole for 14 days, and clarithromycin for 14 days. Patient was referred to GI by PCP for follow-up. ER notes and PCP notes reviewed.  Today she states her epigastric pain and UGI symptoms have resolved. Is still having left sided abdominal pain and returned to the ER where she was diagnosed with shingles. Her rash has resolved but is having post-herpetic neuralgia. Was recently started on Lyrica and lidocaine patches. Denies Deneis hematemesis, hematochezia, and melena. Denies chest pain, dyspnea, dizziness, lightheadedness, syncope, near syncope. Denies any other upper or lower GI symptoms.  Past Medical History  Diagnosis Date  . Pneumonia     5 years ago  . Headache(784.0)   . Arthritis     fingers  . Hypercholesterolemia   . C. difficile diarrhea   . Seizures     Past Surgical History  Procedure Laterality Date  . Abdominal hysterectomy    . Cholecystectomy    . Knee arthroscopy  1973    right knee,  torn cart  . Colonoscopy  06/10/2012    RMR: Colonic polyps -removed as described above.   Marland Kitchen Appendectomy    . Cesarean section      Current Outpatient Prescriptions  Medication Sig Dispense Refill  . acyclovir (ZOVIRAX) 800 MG tablet Take 800 mg by mouth 2 (two) times daily.     Marland Kitchen BRINTELLIX 10 MG TABS 10 mg daily.     . cetirizine (ZYRTEC) 10 MG tablet Take 10 mg by mouth at bedtime.     . lidocaine (LIDODERM) 5 % Place 1 patch onto the skin daily.     Marland Kitchen LORazepam (ATIVAN)  0.5 MG tablet     . oxyCODONE (ROXICODONE) 15 MG immediate release tablet     . simvastatin (ZOCOR) 20 MG tablet Take 20 mg by mouth at bedtime.    Marland Kitchen aspirin EC 81 MG tablet Take 1 tablet (81 mg total) by mouth daily. (Patient not taking: Reported on 04/13/2015)    . buPROPion (WELLBUTRIN SR) 150 MG 12 hr tablet Take 150 mg by mouth daily.     Marland Kitchen doxycycline (VIBRAMYCIN) 100 MG capsule Take 1 capsule (100 mg total) by mouth 2 (two) times daily. (Patient not taking: Reported on 04/13/2015) 14 capsule 0  . escitalopram (LEXAPRO) 10 MG tablet Take 10 mg by mouth daily.     . Fluticasone-Salmeterol (ADVAIR DISKUS) 250-50 MCG/DOSE AEPB Inhale 1 puff into the lungs 2 (two) times daily. (Patient not taking: Reported on 04/13/2015) 60 each 0  . ibuprofen (ADVIL,MOTRIN) 200 MG tablet Take 400 mg by mouth every 6 (six) hours as needed for headache.    . Multiple Vitamin (MULTIVITAMIN WITH MINERALS) TABS Take 1 tablet by mouth daily.    Marland Kitchen topiramate (TOPAMAX) 50 MG tablet Take 1 tablet by mouth two  times daily (Patient not taking: Reported on 04/13/2015) 60 tablet 0  . zolpidem (AMBIEN) 10 MG tablet      No current facility-administered medications for this visit.    Allergies as of  04/13/2015 - Review Complete 04/13/2015  Allergen Reaction Noted  . Penicillins  05/27/2012  . Keppra [levetiracetam]  09/08/2013    No family history on file.  History   Social History  . Marital Status: Married    Spouse Name: [name]   . Number of Children: 3  . Years of Education: 12+   Occupational History  .  [employer]   Social History Main Topics  . Smoking status: Former Smoker -- 1.00 packs/day for 30 years    Quit date: 08/03/2014  . Smokeless tobacco: Never Used  . Alcohol Use: Yes     Comment: 3 per day  . Drug Use: No  . Sexual Activity: Not on file   Other Topics Concern  . None   Social History Narrative   The patient lives at home with husband [name].    Patient has 3  children.    Patient has he Associates Degree.    Patient is currently working.     Review of Systems: General: Negative for anorexia, weight loss, fever, chills, fatigue, weakness. Eyes: Negative for vision changes.  ENT: Negative for hoarseness, difficulty swallowing. CV: Negative for chest pain, angina, palpitations, peripheral edema.  Respiratory: Negative for dyspnea at rest, cough, sputum, wheezing.  GI: See history of present illness. Derm: Denies persistent rash, admits continued pain.  Neuro: Negative for weakness, seizure. Admits post-herpetic neuralgia-like pain to left anterior and lateral trunk.  Endo: Negative for unusual weight change.  Allergy: Negative for rash or hives.   Physical Exam: BP 121/72 mmHg  Pulse 85  Temp(Src) 97.4 F (36.3 C) (Oral)  Ht '5\' 6"'$  (1.676 m)  Wt 142 lb 6.4 oz (64.592 kg)  BMI 22.99 kg/m2 General:   Alert and oriented. Pleasant and cooperative. Well-nourished and well-developed.  Head:  Normocephalic and atraumatic. Eyes:  Without icterus, sclera clear and conjunctiva pink.  Ears:  Normal auditory acuity. Cardiovascular:  S1, S2 present without murmurs appreciated. Normal pulses noted. Extremities without clubbing or edema. Respiratory:  Clear to auscultation bilaterally. No wheezes, rales, or rhonchi. No distress.  Gastrointestinal:  +BS, soft, non-tender and non-distended. No HSM noted. No guarding or rebound. No masses appreciated.  Rectal:  Deferred  Skin:  I1-2 small, barely visible fading healed lesion mark noted left-side anterior abdomen without oozing.. Neurologic:  Alert and oriented x4;  grossly normal neurologically. Psych:  Alert and cooperative. Normal mood and affect. Heme/Lymph/Immune: No excessive bruising noted.    04/13/2015 10:31 AM

## 2015-04-13 NOTE — Progress Notes (Signed)
cc'ed to pcp °

## 2015-04-13 NOTE — Patient Instructions (Signed)
1. Continue current medications 2. Return for follow-up if you have a return of your symptoms and we can do further testing.

## 2015-04-13 NOTE — Assessment & Plan Note (Signed)
Patient status post emergency room visit for epigastric pain, fullness, nausea, bloating. She was diagnosed with H. pylori based on serum antibodies alone and treated with triple therapy including PPI, clarithromycin, and Flagyl. Patient's upper GI symptoms have resolved. Denies further epigastric pain, nausea, vomiting, bloating. Lengthy discussion habits patient on the fact that she will always test positive for serum antibodies for H. pylori and if there is a question of recurrent infection a stool or breath test needs to be used. Advised her to return as needed for any recurrent or persistent symptoms.

## 2015-04-29 ENCOUNTER — Other Ambulatory Visit (HOSPITAL_COMMUNITY): Payer: Self-pay | Admitting: Orthopaedic Surgery

## 2015-05-05 ENCOUNTER — Other Ambulatory Visit (HOSPITAL_COMMUNITY): Payer: Self-pay | Admitting: *Deleted

## 2015-05-05 NOTE — Pre-Procedure Instructions (Signed)
Diane [name]  05/05/2015      Your procedure is scheduled on Wednesday, May 12, 2015 at 3:10 PM.   Report to [facility] Entrance "A" Admitting Office at 1:00 PM.   Call this number if you have problems the morning of surgery: [phone]   Any questions prior to day of surgery, please call [phone] between 8 & 4 PM.    Remember:  Do not eat food or drink liquids after midnight Tuesday, 05/11/15.  Take these medicines the morning of surgery with A SIP OF WATER: Brintellix, Lorazepam - if needed, Oxcycodone - if needed   Do not wear jewelry, make-up or nail polish.  Do not wear lotions, powders, or perfumes.  You may wear deodorant.  Do not shave 48 hours prior to surgery.    Do not bring valuables to the hospital.  [facility] is not responsible for any belongings or valuables.  Contacts, dentures or bridgework may not be worn into surgery.  Leave your suitcase in the car.  After surgery it may be brought to your room.  For patients admitted to the hospital, discharge time will be determined by your treatment team.  Special instructions:  [place] - Preparing for Surgery  Before surgery, you can play an important role.  Because skin is not sterile, your skin needs to be as free of germs as possible.  You can reduce the number of germs on you skin by washing with CHG (chlorahexidine gluconate) soap before surgery.  CHG is an antiseptic cleaner which kills germs and bonds with the skin to continue killing germs even after washing.  Please DO NOT use if you have an allergy to CHG or antibacterial soaps.  If your skin becomes reddened/irritated stop using the CHG and inform your nurse when you arrive at Short Stay.  Do not shave (including legs and underarms) for at least 48 hours prior to the first CHG shower.  You may shave your face.  Please follow these instructions carefully:   1.  Shower with CHG Soap the night before surgery and the                                 morning of Surgery.  2.  If you choose to wash your hair, wash your hair first as usual with your       normal shampoo.  3.  After you shampoo, rinse your hair and body thoroughly to remove the                      Shampoo.  4.  Use CHG as you would any other liquid soap.  You can apply chg directly       to the skin and wash gently with scrungie or a clean washcloth.  5.  Apply the CHG Soap to your body ONLY FROM THE NECK DOWN.        Do not use on open wounds or open sores.  Avoid contact with your eyes, ears, mouth and genitals (private parts).  Wash genitals (private parts) with your normal soap.  6.  Wash thoroughly, paying special attention to the area where your surgery        will be performed.  7.  Thoroughly rinse your body with warm water from the neck down.  8.  DO NOT shower/wash with your normal soap after using and rinsing off  the CHG Soap.  9.  Pat yourself dry with a clean towel.            10.  Wear clean pajamas.            11.  Place clean sheets on your bed the night of your first shower and do not        sleep with pets.  Day of Surgery  Do not apply any lotions the morning of surgery.  Please wear clean clothes to the hospital.    Please read over the following fact sheets that you were given. Pain Booklet, Coughing and Deep Breathing, MRSA Information and Surgical Site Infection Prevention

## 2015-05-06 ENCOUNTER — Encounter (HOSPITAL_COMMUNITY): Payer: Self-pay

## 2015-05-06 ENCOUNTER — Encounter (HOSPITAL_COMMUNITY)
Admission: RE | Admit: 2015-05-06 | Discharge: 2015-05-06 | Disposition: A | Discharge status: Home / Self Care | Payer: 59 | Source: Ambulatory Visit | Attending: Orthopaedic Surgery | Admitting: Orthopaedic Surgery

## 2015-05-06 DIAGNOSIS — Z79899 Other long term (current) drug therapy: Secondary | ICD-10-CM | POA: Insufficient documentation

## 2015-05-06 DIAGNOSIS — M5126 Other intervertebral disc displacement, lumbar region: Secondary | ICD-10-CM | POA: Insufficient documentation

## 2015-05-06 DIAGNOSIS — E780 Pure hypercholesterolemia: Secondary | ICD-10-CM | POA: Diagnosis not present

## 2015-05-06 DIAGNOSIS — Z01818 Encounter for other preprocedural examination: Secondary | ICD-10-CM | POA: Insufficient documentation

## 2015-05-06 DIAGNOSIS — J449 Chronic obstructive pulmonary disease, unspecified: Secondary | ICD-10-CM | POA: Diagnosis not present

## 2015-05-06 DIAGNOSIS — Z01812 Encounter for preprocedural laboratory examination: Secondary | ICD-10-CM | POA: Insufficient documentation

## 2015-05-06 HISTORY — DX: Zoster without complications: B02.9

## 2015-05-06 HISTORY — DX: Chronic obstructive pulmonary disease, unspecified: J44.9

## 2015-05-06 HISTORY — DX: Helicobacter pylori (H. pylori) as the cause of diseases classified elsewhere: B96.81

## 2015-05-06 HISTORY — DX: Helicobacter pylori (H. pylori) as the cause of diseases classified elsewhere: K27.9

## 2015-05-06 HISTORY — DX: Major depressive disorder, single episode, unspecified: F32.9

## 2015-05-06 HISTORY — DX: Depression, unspecified: F32.A

## 2015-05-06 HISTORY — DX: Calculus of kidney: N20.0

## 2015-05-06 LAB — CBC
HCT: 41 % (ref 36.0–46.0)
Hemoglobin: 13.8 g/dL (ref 12.0–15.0)
MCH: 32.2 pg (ref 26.0–34.0)
MCHC: 33.7 g/dL (ref 30.0–36.0)
MCV: 95.8 fL (ref 78.0–100.0)
Platelets: 342 10*3/uL (ref 150–400)
RBC: 4.28 MIL/uL (ref 3.87–5.11)
RDW: 12.9 % (ref 11.5–15.5)
WBC: 6.9 10*3/uL (ref 4.0–10.5)

## 2015-05-06 LAB — COMPREHENSIVE METABOLIC PANEL
ALBUMIN: 3.8 g/dL (ref 3.5–5.0)
ALT: 14 U/L (ref 14–54)
AST: 17 U/L (ref 15–41)
Alkaline Phosphatase: 70 U/L (ref 38–126)
Anion gap: 10 (ref 5–15)
BILIRUBIN TOTAL: 0.5 mg/dL (ref 0.3–1.2)
BUN: 8 mg/dL (ref 6–20)
CALCIUM: 9.6 mg/dL (ref 8.9–10.3)
CO2: 25 mmol/L (ref 22–32)
Chloride: 105 mmol/L (ref 101–111)
Creatinine, Ser: 0.63 mg/dL (ref 0.44–1.00)
GFR calc Af Amer: >60 mL/min (ref >60)
GFR calc non Af Amer: >60 mL/min (ref >60)
Glucose, Bld: 163 mg/dL — ABNORMAL HIGH (ref 65–99)
Potassium: 3.7 mmol/L (ref 3.5–5.1)
Sodium: 140 mmol/L (ref 135–145)
TOTAL PROTEIN: 7.4 g/dL (ref 6.5–8.1)

## 2015-05-06 LAB — SURGICAL PCR SCREEN
MRSA, PCR: NEGATIVE
Staphylococcus aureus: NEGATIVE

## 2015-05-06 NOTE — Progress Notes (Signed)
Pt states that Dr. Lorin Mercy is aware of the fact that she has recently had shingles on her back. States that he has seen the area and it shouldn't be a problem for surgery. She states that the area has dried up.

## 2015-05-06 NOTE — Progress Notes (Signed)
Pt stated that she had chest pain a year ago and "was cleared". When asked if she saw a cardiologist, she stated that she was supposed to but never did. Note in EPIC from Dr. Aundra Dubin who saw her in the ED wanted her to have a Cardiolite stress test and an Echo which she never had done. She states she hasn't had any chest pain since. Pt continues to smoke, states only about 5 cigarettes a day. States she is stopping today prior to surgery.  Pt states she had a "cluster of seizures" about 2 years ago, was on medication for a year - has been off medication for a year. When asked if a reason for the seizure had been found, she stated no b/c she didn't want to continue going to the neurologist every six months.

## 2015-05-07 NOTE — Progress Notes (Signed)
Anesthesia Chart Review: Patient is a 59 year old female scheduled for left L4-5 microdiskectomy on 05/12/15 by Dr. Lorin Mercy.  History includes smoking, COPD, C. difficile colitis 2015, depression, seizures, hypercholesterolemia, nephrolithiasis, cholecystectomy, hysterectomy, appendectomy, tonsillectomy, treated for H. Pylori 03/2015, shingles 03/2015. PCP is Dr. Octavio Graves. GI is Dr. Gala Romney.   She was seen by cardiologist Dr. Loralie Champagne 08/13/14 in the ED for epigastric chest pain (felt like her previous GB pain). Overall felt to have atypical symptoms. Troponin X 3 negative. He planned to arrange an out-patient Cardiolite. Recurrent ED visit 08/29/14 with negative chest CTA and troponin with out-patient cardiology follow-up recommended. She did not keep her cardiology follow-up appointment because she said her symptoms resolved. She did have subsequent visits for more abdominal symptoms--03/04/15 ED visit [facility]) for epigastric pain and diagnosed with H. Pylori based on serum antibodies. Subsequent visit for left sided abdominal pain and diagnosed with shingles.  Meds include acyclovir, Brintellix, Zyrtec, Ativan, oxycodone, Probiotic, simvastatin.   03/04/15 EKG [facility]): NSR. Non-specific anterior T wave abnormality. T wave abnormality is less prominent when compared to her 08/28/14 08/13/14 EKGs, but overall anterior leads are similar to tracing dating back to 10/06/12.  03/04/15 CXR [facility]): IMPRESSION: No radiographic evidence of acute cardiopulmonary disease. Changes of emphysema with chronic lung scarring.   08/28/14 Chest CTA:  IMPRESSION: 1. There is no aortic aneurysm or aortic dissection. 2. No mediastinal hematoma or adenopathy. 3. Bilateral emphysematous changes are noted. Some fibrotic changes with peripheral honeycombing noted bilateral lower lobe posteriorly. 4. There is a 5 mm nodule in left lower lobe just posterior to the fissure. If the patient is at high risk for  bronchogenic carcinoma, follow-up chest CT at 6-12 months is recommended. If the patient is at low risk for bronchogenic carcinoma, follow-up chest CT at 12 months is recommended. This recommendation follows the consensus statement: Guidelines for Management of Small Pulmonary Nodules Detected on CT Scans: A Statement from the [place] as published in Radiology 2005;237:395-400. 5. Status postcholecystectomy.  Preoperative labs noted. PT/INR not done at PAT, so plan to draw on the day of surgery (ordered by surgeon).   Reviewed above with anesthesiologists Dr. Orene Desanctis. Patient seen by cardiology for chest pain/epigastric pain last year. She did not follow-up. See again this year by GI Dr Gala Romney with epigastric pain and diagnosed and treated for H. Pylori and now reports symptoms have resolved. Unless she presents with new CV symptoms it is anticipated that she can proceed as planned.  In regards to patient's 08/28/14 Chest CTA done in the ED that showed a 5 mm LLL nodule: ED notes do not say either way if results were discussed with patient or if the report was forwarded to Dr. Melina Copa. I am doing a chart review, so I have not actually met the patient. From an anesthesia standpoint, she does not need pre-operative follow-up of her abnormal CTA, but she should need follow-up by 08/2014 according to the radiologists recommendations based on guidelines mentioned. I have called and spoke with Dr. Carmie End nurse Rosann Auerbach regarding report and request that future follow-up be arranged. She said patient was due for follow-up ~ 05/2015 and would have Dr. Melina Copa review for recommendations. CTA results will be faxed with confirmation to her office at [phone].  George Hugh [facility]/Anesthesiology Phone [phone] 05/07/2015 3:05 PM

## 2015-05-11 MED ORDER — VANCOMYCIN HCL IN DEXTROSE 1-5 GM/200ML-% IV SOLN
1000.0000 mg | INTRAVENOUS | Status: AC
  Administered 2015-05-12: 1000 mg via INTRAVENOUS
  Filled 2015-05-11: qty 200

## 2015-05-12 ENCOUNTER — Ambulatory Visit (HOSPITAL_COMMUNITY): Payer: 59 | Admitting: Vascular Surgery

## 2015-05-12 ENCOUNTER — Encounter (HOSPITAL_COMMUNITY): Payer: Self-pay | Admitting: Anesthesiology

## 2015-05-12 ENCOUNTER — Observation Stay (HOSPITAL_COMMUNITY)
Admission: RE | Admit: 2015-05-12 | Discharge: 2015-05-13 | Disposition: A | Discharge status: Home / Self Care | Payer: 59 | Source: Ambulatory Visit | Attending: Orthopaedic Surgery | Admitting: Orthopaedic Surgery

## 2015-05-12 ENCOUNTER — Ambulatory Visit (HOSPITAL_COMMUNITY): Payer: 59 | Admitting: Anesthesiology

## 2015-05-12 ENCOUNTER — Encounter (HOSPITAL_COMMUNITY)
Admission: RE | Disposition: A | Discharge status: Home / Self Care | Payer: Self-pay | Source: Ambulatory Visit | Attending: Orthopaedic Surgery

## 2015-05-12 ENCOUNTER — Ambulatory Visit (HOSPITAL_COMMUNITY): Payer: 59

## 2015-05-12 DIAGNOSIS — E780 Pure hypercholesterolemia: Secondary | ICD-10-CM | POA: Diagnosis not present

## 2015-05-12 DIAGNOSIS — F329 Major depressive disorder, single episode, unspecified: Secondary | ICD-10-CM | POA: Insufficient documentation

## 2015-05-12 DIAGNOSIS — F17210 Nicotine dependence, cigarettes, uncomplicated: Secondary | ICD-10-CM | POA: Insufficient documentation

## 2015-05-12 DIAGNOSIS — Z79899 Other long term (current) drug therapy: Secondary | ICD-10-CM | POA: Insufficient documentation

## 2015-05-12 DIAGNOSIS — Z419 Encounter for procedure for purposes other than remedying health state, unspecified: Secondary | ICD-10-CM

## 2015-05-12 DIAGNOSIS — M19072 Primary osteoarthritis, left ankle and foot: Secondary | ICD-10-CM | POA: Diagnosis not present

## 2015-05-12 DIAGNOSIS — J449 Chronic obstructive pulmonary disease, unspecified: Secondary | ICD-10-CM | POA: Diagnosis not present

## 2015-05-12 DIAGNOSIS — Z9889 Other specified postprocedural states: Secondary | ICD-10-CM

## 2015-05-12 DIAGNOSIS — M5116 Intervertebral disc disorders with radiculopathy, lumbar region: Secondary | ICD-10-CM | POA: Diagnosis present

## 2015-05-12 DIAGNOSIS — Z87442 Personal history of urinary calculi: Secondary | ICD-10-CM | POA: Diagnosis not present

## 2015-05-12 DIAGNOSIS — M19049 Primary osteoarthritis, unspecified hand: Secondary | ICD-10-CM | POA: Diagnosis not present

## 2015-05-12 DIAGNOSIS — R51 Headache: Secondary | ICD-10-CM | POA: Diagnosis not present

## 2015-05-12 DIAGNOSIS — Z880 Allergy status to penicillin: Secondary | ICD-10-CM | POA: Diagnosis not present

## 2015-05-12 HISTORY — PX: LUMBAR DISC SURGERY: SHX700

## 2015-05-12 HISTORY — PX: LUMBAR LAMINECTOMY/DECOMPRESSION MICRODISCECTOMY: SHX5026

## 2015-05-12 LAB — PROTIME-INR
INR: 1.03 (ref 0.00–1.49)
PROTHROMBIN TIME: 13.7 s (ref 11.6–15.2)

## 2015-05-12 SURGERY — LUMBAR LAMINECTOMY/DECOMPRESSION MICRODISCECTOMY
Anesthesia: General | Site: Spine Lumbar | Laterality: Left

## 2015-05-12 MED ORDER — BISACODYL 10 MG RE SUPP: 10.0000 mg | Freq: Every day | RECTAL | Status: DC | PRN

## 2015-05-12 MED ORDER — HYDROMORPHONE HCL 1 MG/ML IJ SOLN
0.5000 mg | INTRAMUSCULAR | Status: DC | PRN
  Administered 2015-05-12 – 2015-05-13 (×6): 1 mg via INTRAVENOUS
  Filled 2015-05-12 (×6): qty 1

## 2015-05-12 MED ORDER — BUPIVACAINE HCL (PF) 0.25 % IJ SOLN
INTRAMUSCULAR | Status: AC
  Filled 2015-05-12: qty 30

## 2015-05-12 MED ORDER — PROPOFOL 10 MG/ML IV BOLUS
INTRAVENOUS | Status: AC
  Filled 2015-05-12: qty 20

## 2015-05-12 MED ORDER — ONDANSETRON HCL 4 MG/2ML IJ SOLN
INTRAMUSCULAR | Status: DC | PRN
  Administered 2015-05-12: 4 mg via INTRAVENOUS

## 2015-05-12 MED ORDER — HYDROMORPHONE HCL 1 MG/ML IJ SOLN
INTRAMUSCULAR | Status: AC
  Filled 2015-05-12: qty 1

## 2015-05-12 MED ORDER — GLYCOPYRROLATE 0.2 MG/ML IJ SOLN
INTRAMUSCULAR | Status: DC | PRN
  Administered 2015-05-12: 0.4 mg via INTRAVENOUS

## 2015-05-12 MED ORDER — METHOCARBAMOL 500 MG PO TABS: 500.0000 mg | ORAL_TABLET | Freq: Four times a day (QID) | ORAL | Status: DC | PRN

## 2015-05-12 MED ORDER — FENTANYL CITRATE (PF) 250 MCG/5ML IJ SOLN
INTRAMUSCULAR | Status: AC
  Filled 2015-05-12: qty 5

## 2015-05-12 MED ORDER — LIDOCAINE HCL (CARDIAC) 20 MG/ML IV SOLN
INTRAVENOUS | Status: AC
  Filled 2015-05-12: qty 5

## 2015-05-12 MED ORDER — LACTATED RINGERS IV SOLN
INTRAVENOUS | Status: DC | PRN
  Administered 2015-05-12 (×2): via INTRAVENOUS

## 2015-05-12 MED ORDER — ROCURONIUM BROMIDE 100 MG/10ML IV SOLN
INTRAVENOUS | Status: DC | PRN
  Administered 2015-05-12: 40 mg via INTRAVENOUS
  Administered 2015-05-12: 10 mg via INTRAVENOUS

## 2015-05-12 MED ORDER — PHENOL 1.4 % MT LIQD: 1.0000 | OROMUCOSAL | Status: DC | PRN

## 2015-05-12 MED ORDER — DEXTROSE 5 % IV SOLN
10.0000 mg | INTRAVENOUS | Status: DC | PRN
  Administered 2015-05-12: 10 ug/min via INTRAVENOUS

## 2015-05-12 MED ORDER — OXYCODONE-ACETAMINOPHEN 5-325 MG PO TABS
ORAL_TABLET | ORAL | Status: AC
  Filled 2015-05-12: qty 2

## 2015-05-12 MED ORDER — ACYCLOVIR 400 MG PO TABS: 800.0000 mg | ORAL_TABLET | Freq: Two times a day (BID) | ORAL | Status: DC | PRN

## 2015-05-12 MED ORDER — OXYCODONE-ACETAMINOPHEN 5-325 MG PO TABS
1.0000 | ORAL_TABLET | Freq: Four times a day (QID) | ORAL | Status: DC | PRN
  Administered 2015-05-12 – 2015-05-13 (×3): 2 via ORAL
  Filled 2015-05-12 (×2): qty 2

## 2015-05-12 MED ORDER — LIDOCAINE HCL (CARDIAC) 20 MG/ML IV SOLN
INTRAVENOUS | Status: DC | PRN
  Administered 2015-05-12: 80 mg via INTRAVENOUS

## 2015-05-12 MED ORDER — METHOCARBAMOL 500 MG PO TABS
500.0000 mg | ORAL_TABLET | Freq: Four times a day (QID) | ORAL | Status: DC | PRN
  Administered 2015-05-12 – 2015-05-13 (×3): 500 mg via ORAL
  Filled 2015-05-12 (×3): qty 1

## 2015-05-12 MED ORDER — LORAZEPAM 0.5 MG PO TABS: 0.5000 mg | ORAL_TABLET | Freq: Two times a day (BID) | ORAL | Status: DC | PRN

## 2015-05-12 MED ORDER — PROPOFOL 10 MG/ML IV BOLUS
INTRAVENOUS | Status: DC | PRN
  Administered 2015-05-12: 160 mg via INTRAVENOUS
  Administered 2015-05-12 (×2): 40 mg via INTRAVENOUS

## 2015-05-12 MED ORDER — MENTHOL 3 MG MT LOZG: 1.0000 | LOZENGE | OROMUCOSAL | Status: DC | PRN

## 2015-05-12 MED ORDER — SIMVASTATIN 20 MG PO TABS
20.0000 mg | ORAL_TABLET | Freq: Every day | ORAL | Status: DC
  Administered 2015-05-12: 20 mg via ORAL
  Filled 2015-05-12: qty 1

## 2015-05-12 MED ORDER — ONDANSETRON HCL 4 MG/2ML IJ SOLN: 4.0000 mg | INTRAMUSCULAR | Status: DC | PRN

## 2015-05-12 MED ORDER — OXYCODONE-ACETAMINOPHEN 7.5-325 MG PO TABS: 1.0000 | ORAL_TABLET | Freq: Four times a day (QID) | ORAL | Status: DC | PRN

## 2015-05-12 MED ORDER — LACTATED RINGERS IV SOLN
INTRAVENOUS | Status: DC
  Administered 2015-05-12: 14:00:00 via INTRAVENOUS

## 2015-05-12 MED ORDER — SODIUM CHLORIDE 0.9 % IJ SOLN: 3.0000 mL | Freq: Two times a day (BID) | INTRAMUSCULAR | Status: DC

## 2015-05-12 MED ORDER — ONDANSETRON HCL 4 MG/2ML IJ SOLN: 4.0000 mg | Freq: Once | INTRAMUSCULAR | Status: DC | PRN

## 2015-05-12 MED ORDER — SODIUM CHLORIDE 0.9 % IV SOLN: 250.0000 mL | INTRAVENOUS | Status: DC

## 2015-05-12 MED ORDER — NEOSTIGMINE METHYLSULFATE 10 MG/10ML IV SOLN
INTRAVENOUS | Status: DC | PRN
  Administered 2015-05-12: 3 mg via INTRAVENOUS

## 2015-05-12 MED ORDER — VORTIOXETINE HBR 20 MG PO TABS
10.0000 mg | ORAL_TABLET | Freq: Every day | ORAL | Status: DC
Start: 2015-05-12 — End: 2015-05-13
  Filled 2015-05-12 (×2): qty 10

## 2015-05-12 MED ORDER — ONDANSETRON HCL 4 MG/2ML IJ SOLN
INTRAMUSCULAR | Status: AC
  Filled 2015-05-12: qty 4

## 2015-05-12 MED ORDER — NEOSTIGMINE METHYLSULFATE 10 MG/10ML IV SOLN
INTRAVENOUS | Status: AC
  Filled 2015-05-12: qty 1

## 2015-05-12 MED ORDER — POTASSIUM CHLORIDE IN NACL 20-0.9 MEQ/L-% IV SOLN: INTRAVENOUS | Status: DC

## 2015-05-12 MED ORDER — SODIUM CHLORIDE 0.9 % IJ SOLN: 3.0000 mL | INTRAMUSCULAR | Status: DC | PRN

## 2015-05-12 MED ORDER — CHLORHEXIDINE GLUCONATE 4 % EX LIQD: 60.0000 mL | Freq: Once | CUTANEOUS | Status: DC

## 2015-05-12 MED ORDER — 0.9 % SODIUM CHLORIDE (POUR BTL) OPTIME
TOPICAL | Status: DC | PRN
  Administered 2015-05-12: 1000 mL

## 2015-05-12 MED ORDER — HYDROMORPHONE HCL 1 MG/ML IJ SOLN
0.5000 mg | INTRAMUSCULAR | Status: DC | PRN
  Administered 2015-05-12 (×2): 0.5 mg via INTRAVENOUS

## 2015-05-12 MED ORDER — DOCUSATE SODIUM 100 MG PO CAPS
100.0000 mg | ORAL_CAPSULE | Freq: Two times a day (BID) | ORAL | Status: DC
  Administered 2015-05-12 – 2015-05-13 (×2): 100 mg via ORAL
  Filled 2015-05-12 (×2): qty 1

## 2015-05-12 MED ORDER — GLYCOPYRROLATE 0.2 MG/ML IJ SOLN
INTRAMUSCULAR | Status: AC
  Filled 2015-05-12: qty 2

## 2015-05-12 MED ORDER — MIDAZOLAM HCL 2 MG/2ML IJ SOLN
INTRAMUSCULAR | Status: AC
  Filled 2015-05-12: qty 2

## 2015-05-12 MED ORDER — LORATADINE 10 MG PO TABS
10.0000 mg | ORAL_TABLET | Freq: Every day | ORAL | Status: DC
  Administered 2015-05-13: 10 mg via ORAL
  Filled 2015-05-12 (×2): qty 1

## 2015-05-12 MED ORDER — FENTANYL CITRATE (PF) 100 MCG/2ML IJ SOLN
INTRAMUSCULAR | Status: DC | PRN
  Administered 2015-05-12: 50 ug via INTRAVENOUS
  Administered 2015-05-12 (×2): 100 ug via INTRAVENOUS

## 2015-05-12 MED ORDER — BUPIVACAINE HCL (PF) 0.25 % IJ SOLN
INTRAMUSCULAR | Status: DC | PRN
  Administered 2015-05-12: 10 mL

## 2015-05-12 MED ORDER — POLYETHYLENE GLYCOL 3350 17 G PO PACK: 17.0000 g | PACK | Freq: Every day | ORAL | Status: DC | PRN

## 2015-05-12 MED ORDER — MIDAZOLAM HCL 5 MG/5ML IJ SOLN
INTRAMUSCULAR | Status: DC | PRN
  Administered 2015-05-12 (×2): 1 mg via INTRAVENOUS

## 2015-05-12 MED ORDER — METHOCARBAMOL 1000 MG/10ML IJ SOLN: 500.0000 mg | Freq: Four times a day (QID) | INTRAMUSCULAR | Status: DC | PRN

## 2015-05-12 SURGICAL SUPPLY — 42 items
BUR ROUND FLUTED 4 SOFT TCH (BURR) ×2 IMPLANT
CANISTER SUCTION 2500CC (MISCELLANEOUS) ×2 IMPLANT
CLSR STERI-STRIP ANTIMIC 1/2X4 (GAUZE/BANDAGES/DRESSINGS) ×2 IMPLANT
COVER SURGICAL LIGHT HANDLE (MISCELLANEOUS) ×2 IMPLANT
DERMABOND ADHESIVE PROPEN (GAUZE/BANDAGES/DRESSINGS) ×1
DERMABOND ADVANCED (GAUZE/BANDAGES/DRESSINGS) ×1
DERMABOND ADVANCED .7 DNX12 (GAUZE/BANDAGES/DRESSINGS) ×1 IMPLANT
DERMABOND ADVANCED .7 DNX6 (GAUZE/BANDAGES/DRESSINGS) ×1 IMPLANT
DRAPE MICROSCOPE LEICA (MISCELLANEOUS) ×2 IMPLANT
DRAPE PROXIMA HALF (DRAPES) ×4 IMPLANT
DRSG MEPILEX BORDER 4X4 (GAUZE/BANDAGES/DRESSINGS) ×2 IMPLANT
DRSG MEPILEX BORDER 4X8 (GAUZE/BANDAGES/DRESSINGS) ×2 IMPLANT
DURAPREP 26ML APPLICATOR (WOUND CARE) ×2 IMPLANT
ELECT REM PT RETURN 9FT ADLT (ELECTROSURGICAL) ×2
ELECTRODE REM PT RTRN 9FT ADLT (ELECTROSURGICAL) ×1 IMPLANT
GLOVE BIOGEL PI IND STRL 8 (GLOVE) ×2 IMPLANT
GLOVE BIOGEL PI INDICATOR 8 (GLOVE) ×2
GLOVE ORTHO TXT STRL SZ7.5 (GLOVE) ×4 IMPLANT
GOWN STRL REUS W/ TWL LRG LVL3 (GOWN DISPOSABLE) ×2 IMPLANT
GOWN STRL REUS W/ TWL XL LVL3 (GOWN DISPOSABLE) ×1 IMPLANT
GOWN STRL REUS W/TWL 2XL LVL3 (GOWN DISPOSABLE) ×2 IMPLANT
GOWN STRL REUS W/TWL LRG LVL3 (GOWN DISPOSABLE) ×2
GOWN STRL REUS W/TWL XL LVL3 (GOWN DISPOSABLE) ×1
KIT BASIN OR (CUSTOM PROCEDURE TRAY) ×2 IMPLANT
KIT ROOM TURNOVER OR (KITS) ×2 IMPLANT
MANIFOLD NEPTUNE II (INSTRUMENTS) IMPLANT
NEEDLE HYPO 25GX1X1/2 BEV (NEEDLE) ×2 IMPLANT
NEEDLE SPNL 18GX3.5 QUINCKE PK (NEEDLE) ×2 IMPLANT
NS IRRIG 1000ML POUR BTL (IV SOLUTION) ×2 IMPLANT
PACK LAMINECTOMY ORTHO (CUSTOM PROCEDURE TRAY) ×2 IMPLANT
PAD ARMBOARD 7.5X6 YLW CONV (MISCELLANEOUS) ×4 IMPLANT
PATTIES SURGICAL .5 X.5 (GAUZE/BANDAGES/DRESSINGS) IMPLANT
PATTIES SURGICAL .75X.75 (GAUZE/BANDAGES/DRESSINGS) IMPLANT
SUT BONE WAX W31G (SUTURE) IMPLANT
SUT VIC AB 0 CT1 27 (SUTURE) ×1
SUT VIC AB 0 CT1 27XBRD ANBCTR (SUTURE) ×1 IMPLANT
SUT VIC AB 2-0 CT1 27 (SUTURE) ×1
SUT VIC AB 2-0 CT1 TAPERPNT 27 (SUTURE) ×1 IMPLANT
SUT VIC AB 3-0 X1 27 (SUTURE) ×2 IMPLANT
TOWEL OR 17X24 6PK STRL BLUE (TOWEL DISPOSABLE) ×2 IMPLANT
TOWEL OR 17X26 10 PK STRL BLUE (TOWEL DISPOSABLE) ×2 IMPLANT
WATER STERILE IRR 1000ML POUR (IV SOLUTION) IMPLANT

## 2015-05-12 NOTE — Interval H&P Note (Signed)
History and Physical Interval Note:  05/12/2015 2:20 PM  Diane [name]  has presented today for surgery, with the diagnosis of Left Herniated Nucleus Pulposus L4-5 Free Fragment  The various methods of treatment have been discussed with the patient and family. After consideration of risks, benefits and other options for treatment, the patient has consented to  Procedure(s): Left L4-5 Microdiscectomy (Left) as a surgical intervention .  The patient's history has been reviewed, patient examined, no change in status, stable for surgery.  I have reviewed the patient's chart and labs.  Questions were answered to the patient's satisfaction.     YATES,MARK C

## 2015-05-12 NOTE — H&P (Signed)
Diane [name] is an 59 y.o. female.    HISTORY OF PRESENT ILLNESS: This 59 year old is referred by Dr. Durward Fortes for surgical treatment of back pain that has been present for 20 years but it has been extremely severe since March.  She has been taking a large amount of oxycodone through Dr. Carmie End office and she states that she has severe back pain, left leg pain and left leg weakness.  She has been taking oxycodone 15 mg and she states that this not controlling her pain.  She has been on lidocaine patches and Lyrica.  She has a past history of shingles.  The patient states that she is not really able to handle the pain.    MEDICATIONS:  She is on Zyrtec, Trintellix 10 mg once a day, simvastatin 20 mg a day, lorazepam 0.5 mg twice a day, acyclovir 800 mg daily and prednisone 20 mg.  She has been on a taper.     ALLERGIES:  Penicillin, which causes a rash.   PAST MEDICAL/SURGICAL HISTORY:  Include a tonsillectomy in 1963, knee arthroscopy in 1974, hysterectomy in 1988, gallbladder surgery in 2014 and C diff in 2014.   SOCIAL HISTORY:  The patient is married to her husband Diane [name].  She works in administration.  She smokes 6 cigarettes a day.  She drinks alcohol daily but not lately.    FAMILY HISTORY:  Positive for a sister with lung disease.  Multiple myeloma and lung cancer run in her family.    REVIEW OF SYSTEMS:  Positive for anxiety and bladder problems.     Past Medical History  Diagnosis Date  . Pneumonia     5 years ago  . Headache(784.0)   . Hypercholesterolemia   . C. difficile diarrhea 2015  . COPD (chronic obstructive pulmonary disease)   . Depression   . Kidney stones     20 years ago  . Seizures     2 years ago, "cluster of seizures" none since  . Arthritis     fingers, left foot  . H pylori ulcer   . Shingles     Past Surgical History  Procedure Laterality Date  . Abdominal hysterectomy    . Cholecystectomy    . Knee arthroscopy  1973    right knee,  torn  cart  . Colonoscopy  06/10/2012    RMR: Colonic polyps -removed as described above.   Marland Kitchen Appendectomy    . Cesarean section    . Tonsillectomy      No family history on file. Social History:  reports that she has been smoking.  She has never used smokeless tobacco. She reports that she drinks about 12.6 oz of alcohol per week. She reports that she does not use illicit drugs.  Allergies:  Allergies  Allergen Reactions  . Penicillins     Unknown type of reaction  . Keppra [Levetiracetam]     irritability    No prescriptions prior to admission    No results found for this or any previous visit (from the past 48 hour(s)). No results found.  Review of Systems  Constitutional: Negative.   HENT: Negative.   Eyes: Negative.   Respiratory: Negative.   Cardiovascular: Negative.   Gastrointestinal: Negative.   Genitourinary: Negative.   Musculoskeletal: Positive for back pain.  Skin: Negative.   Psychiatric/Behavioral: Negative.     There were no vitals taken for this visit. Physical Exam  Constitutional: She is oriented to person, place, and time. She appears well-developed.  HENT:  Head: Normocephalic.  Eyes: Pupils are equal, round, and reactive to light.  Neck: Normal range of motion.  Respiratory: Effort normal.  GI: She exhibits no distension.  Musculoskeletal: She exhibits tenderness.  Neurological: She is alert and oriented to person, place, and time.  Skin: Skin is warm.  Psychiatric: She has a normal mood and affect.      PHYSICAL EXAMINATION:  The patient is alert and oriented.  Extraocular movements are intact.  Good visual acuity with glasses.  Mild late expiratory wheezes.  Heart with regular rate and rhythm.  Abdomen is soft and nontender.  Pain with straight leg raising and left sciatic notch tenderness.  Positive straight leg raising at 60 degrees.  She has no pain with hip range of motion.  Anterior tib is weak with EHL weakness on the left, none on the  right.    RADIOGRAPHS/TESTS: An MRI scan was done on 04/20/2015 which shows a large soft tissue L4-5 disk extrusion posteriorly on the left with severe left L5 nerve root compression.  The disk measures 20 x 9 x 9 mm.  Her MRI scan is reviewed with her.  I gave her a copy of the report.  She has a disk extrusion at L4-5 on the left with nerve root compression.       PLAN:  She would like to proceed with operative intervention.  The planned procedure was discussed with the patient.  The risks of surgery were discussed.  She understands and wishes to proceed.   Diane [name] M 05/12/2015, 11:42 AM

## 2015-05-12 NOTE — Transfer of Care (Signed)
Immediate Anesthesia Transfer of Care Note  Patient: Diane [name]  Procedure(s) Performed: Procedure(s): Left L4-5 Microdiscectomy (Left)  Patient Location: PACU  Anesthesia Type:General  Level of Consciousness: awake, alert , oriented and patient cooperative  Airway & Oxygen Therapy: Patient Spontanous Breathing and Patient connected to nasal cannula oxygen  Post-op Assessment: Report given to RN, Post -op Vital signs reviewed and stable and Patient moving all extremities  Post vital signs: Reviewed and stable  Last Vitals:  Filed Vitals:   05/12/15 1310  BP: 112/60  Pulse: 59  Temp: 36.6 C  Resp: 20    Complications: No apparent anesthesia complications

## 2015-05-12 NOTE — Brief Op Note (Signed)
05/12/2015  4:56 PM  PATIENT:  Diane [name]  59 y.o. female  PRE-OPERATIVE DIAGNOSIS:  Left Herniated Nucleus Pulposus L4-5 Free Fragment  POST-OPERATIVE DIAGNOSIS:  Left Herniated Nucleus Pulposus L4-5 Free Fragment  PROCEDURE:  Procedure(s): Left L4-5 Microdiscectomy (Left)  SURGEON:  Surgeon(s) and Role:    Marybelle Killings, MD - Primary  PHYSICIAN ASSISTANT: james m. Owens pa-c    ANESTHESIA:   general  EBL:  Total I/O In: 1000 [I.V.:1000] Out: 25 [Blood:25]  BLOOD ADMINISTERED:none  DRAINS: none   LOCAL MEDICATIONS USED:  MARCAINE     SPECIMEN:  No Specimen  DISPOSITION OF SPECIMEN:  N/A  COUNTS:  YES  TOURNIQUET:  * No tourniquets in log *   PATIENT DISPOSITION:  PACU - hemodynamically stable.

## 2015-05-12 NOTE — Anesthesia Preprocedure Evaluation (Signed)
Anesthesia Evaluation  Patient identified by MRN, date of birth, ID band Patient awake    Reviewed: Allergy & Precautions, NPO status , Patient's Chart, lab work & pertinent test results  Airway Mallampati: I       Dental   Pulmonary COPDCurrent Smoker,    Pulmonary exam normal       Cardiovascular Normal cardiovascular exam    Neuro/Psych  Headaches, Seizures -,  Depression    GI/Hepatic   Endo/Other    Renal/GU Renal disease     Musculoskeletal  (+) Arthritis -,   Abdominal   Peds  Hematology   Anesthesia Other Findings   Reproductive/Obstetrics                             Anesthesia Physical Anesthesia Plan  ASA: II  Anesthesia Plan: General   Post-op Pain Management:    Induction: Intravenous  Airway Management Planned: Oral ETT  Additional Equipment:   Intra-op Plan:   Post-operative Plan: Extubation in OR  Informed Consent: I have reviewed the patients History and Physical, chart, labs and discussed the procedure including the risks, benefits and alternatives for the proposed anesthesia with the patient or authorized representative who has indicated his/her understanding and acceptance.     Plan Discussed with: CRNA, Anesthesiologist and Surgeon  Anesthesia Plan Comments:         Anesthesia Quick Evaluation

## 2015-05-12 NOTE — Anesthesia Procedure Notes (Signed)
Procedure Name: Intubation Date/Time: 05/12/2015 2:58 PM Performed by: Izora Gala Pre-anesthesia Checklist: Patient identified and Emergency Drugs available Patient Re-evaluated:Patient Re-evaluated prior to inductionOxygen Delivery Method: Circle system utilized Preoxygenation: Pre-oxygenation with 100% oxygen Intubation Type: IV induction Ventilation: Mask ventilation without difficulty Laryngoscope Size: Miller and 3 Grade View: Grade II Tube type: Oral Tube size: 7.5 mm Number of attempts: 1 Airway Equipment and Method: Stylet,  LTA kit utilized and Bite block Placement Confirmation: ETT inserted through vocal cords under direct vision,  positive ETCO2 and breath sounds checked- equal and bilateral Secured at: 22 cm Tube secured with: Tape Dental Injury: Teeth and Oropharynx as per pre-operative assessment

## 2015-05-13 ENCOUNTER — Encounter (HOSPITAL_COMMUNITY): Payer: Self-pay | Admitting: General Practice

## 2015-05-13 DIAGNOSIS — M5116 Intervertebral disc disorders with radiculopathy, lumbar region: Secondary | ICD-10-CM | POA: Diagnosis not present

## 2015-05-13 NOTE — Progress Notes (Addendum)
Subjective: 1 Day Post-Op Procedure(s) (LRB): Left L4-5 Microdiscectomy (Left) Patient reports pain as   Mild to moderate. She was on Oxy IR '15mg'$  before surgery for several months. Son with narcotic addiction problems.  Leg pain is gone.   Objective: Vital signs in last 24 hours: Temp:  [97.8 F (36.6 [name])-98.7 F (37.1 [name])] 98.7 F (37.1 [name]) (07/28 0603) Pulse Rate:  [50-88] 73 (07/28 0603) Resp:  [9-21] 17 (07/28 0603) BP: (111-149)/(55-68) 121/57 mmHg (07/28 0603) SpO2:  [91 %-97 %] 91 % (07/28 0603) Weight:  [62.596 kg (138 lb)] 62.596 kg (138 lb) (07/27 1310)  Intake/Output from previous day: 07/27 0701 - 07/28 0700 In: 1000 [I.V.:1000] Out: 25 [Blood:25] Intake/Output this shift:    No results for input(s): HGB in the last 72 hours. No results for input(s): WBC, RBC, HCT, PLT in the last 72 hours. No results for input(s): NA, K, CL, CO2, BUN, CREATININE, GLUCOSE, CALCIUM in the last 72 hours.  Recent Labs  05/12/15 1323  INR 1.03    Neurologically intact  Assessment/Plan: 1 Day Post-Op Procedure(s) (LRB): Left L4-5 Microdiscectomy (Left) Plan:   Discharge Home.     PATEINTS SON HAD CALLED AND TALKED TO NURSE ABOUT HER PAIN MEDICATION. DISCUSSION WITH PT TO MAKE SURE HER PAIN MED IS IN A SAFE PLACE. OFFICE ONE WEEK . HOME ON PERCOCET 7.5/325.   Diane [name] 05/13/2015, 7:19 AM

## 2015-05-13 NOTE — Progress Notes (Signed)
Patient ambulated in room with walker around 30 feet.  She tolerated walking and movement with minimum increase in pain.  She also has been ambulating to the bathroom with 1 standby assist and is voiding.

## 2015-05-13 NOTE — Op Note (Signed)
[name], Diane [name] NO.:  [id number]  MEDICAL RECORD NO.:  [id number]  LOCATION:  [id number]                        FACILITY:  [facility]  PHYSICIAN:  Laurent Cargile C. Lorin Mercy, M.D.    DATE OF BIRTH:  02/09/1956  DATE OF PROCEDURE:  05/12/2015 DATE OF DISCHARGE:                              OPERATIVE REPORT   PREOPERATIVE DIAGNOSIS:  Left L4-L5 herniated nucleus pulposus with caudally  migrated fragment and radiculopathy.  POSTOPERATIVE DIAGNOSIS:  Left L4-L5 herniated nucleus pulposus with caudally migrated fragment and radiculopathy.  PROCEDURE:  Left L4-L5 microdiskectomy, removal of herniated nucleus pulposus.  SURGEON:  Aryon Nham C. Lorin Mercy, M.D.  ASSISTANT:  Benjiman Core PA-C, medically necessary and present for the entire procedure.  ANESTHESIA:  General plus Marcaine skin local.  COMPLICATIONS:  None.  DESCRIPTION OF PROCEDURE:  After induction of general anesthesia, the patient was  placed prone, standard prepping and draping.  Betadine and Steri-Drape was applied, usual laminectomy sheet drapes.  Spinal needle was placed at the L4-L5 level.  Cross-table lateral x-ray confirmed appropriate level.  Time-out procedure done and antibiotics given. Small incision was made just a mm to the left and Cobb retractor down to the L4-L5 interspace, L5-S1 was palpated with a finger tip.  Laminotomy was performed, removing some bone, removing some of the ligamentum, and then placing a Penfield #4 down lateral in the gutter at the L4-L5 interspace, and the second x-ray confirmed that this was the appropriate L4-L5 space.  Microscope was draped, brought in, and removal of remaining chunks of lateral ligament.  The distal fragment had migrated inferiorly within a sac of fibrous tissue.  A small piece was squeezing out and this was opened with a 15 blade with the D'Errico retractor used to carefully protect the nerve root.  Nerve root was tight and as the fragment was grabbed, it was  gently teased out from under the nerve root and all came out in 1 big piece.  Palpation with the ball-tip probe, and then the dural separator showed that the fragment was completely removed.  Nerve root was freed, passes were made back into the middle of the disk, and removal of remaining small amount of degenerative material.  Nerve root was freed, small amount of bone was trimmed back, as well as ligament that was remaining, so the nerve root was completely free.  Hockey stick could be passed anterior to the dura, 180 degrees sweep, nice and loose.  Palpation of the dura showed it was soft, a couple of small epidural veins were coagulated with the bipolar using the microscope.  Final passes were made through the disk space and disk was flat.  No remaining nerve root compression.  Nerve free was palpated using the hockey stick over the shoulder of the nerve root and then the axilla of the nerve root and was completely free.  Irrigation and operative field was dry.  Standard closure #1 Vicryl in the fascia, 2-0 on the subcutaneous tissue, and subcuticular closure.  Dermabond in the skin, postop dressing, and transferred to the recovery room.  Instrument count and needle count were correct.     Hamlet Lasecki C. Lorin Mercy, M.D.     MCY/MEDQ  D:  05/12/2015  T:  05/13/2015  Job:  [id number]

## 2015-05-13 NOTE — Anesthesia Postprocedure Evaluation (Signed)
  Anesthesia Post-op Note  Patient: Diane [name]  Procedure(s) Performed: Procedure(s): Left L4-5 Microdiscectomy (Left)  Patient Location: PACU  Anesthesia Type:General  Level of Consciousness: awake and alert   Airway and Oxygen Therapy: Patient Spontanous Breathing  Post-op Pain: mild  Post-op Assessment: Post-op Vital signs reviewed              Post-op Vital Signs: stable  Last Vitals:  Filed Vitals:   05/13/15 0603  BP: 121/57  Pulse: 73  Temp: 37.1 C  Resp: 17    Complications: No apparent anesthesia complications

## 2015-05-13 NOTE — Progress Notes (Signed)
Pt ready for d/c per MD. No equipment needs, does not need PT/OT. Discharge teaching and prescriptions given to pt and son at bedside, all questions answered. New mepilex applied, peripheral IV removed.   Raquel James 05/13/2015

## 2015-05-18 NOTE — Discharge Summary (Signed)
Patient ID: Diane [name] MRN: [id number] DOB/AGE: 05-29-1956 59 y.o.  Admit date: 05/12/2015 Discharge date: 05/18/2015  Admission Diagnoses:  Active Problems:   S/P lumbar discectomy   Discharge Diagnoses:  Active Problems:   S/P lumbar discectomy  status post Procedure(s): Left L4-5 Microdiscectomy  Past Medical History  Diagnosis Date  . Pneumonia     5 years ago  . Headache(784.0)   . Hypercholesterolemia   . C. difficile diarrhea 2015  . COPD (chronic obstructive pulmonary disease)   . Depression   . Kidney stones     20 years ago  . Seizures     2 years ago, "cluster of seizures" none since  . Arthritis     fingers, left foot  . H pylori ulcer   . Shingles     Surgeries: Procedure(s): Left L4-5 Microdiscectomy on 05/12/2015   Consultants:    Discharged Condition: Improved  Hospital Course: Diane [name] is an 59 y.o. female who was admitted 05/12/2015 for operative treatment of lumbar hnp. Patient failed conservative treatments (please see the history and physical for the specifics) and had severe unremitting pain that affects sleep, daily activities and work/hobbies. After pre-op clearance, the patient was taken to the operating room on 05/12/2015 and underwent  Procedure(s): Left L4-5 Microdiscectomy.    Patient was given perioperative antibiotics:  Anti-infectives    Start     Dose/Rate Route Frequency Ordered Stop   05/12/15 1823  acyclovir (ZOVIRAX) tablet 800 mg  Status:  Discontinued     800 mg Oral 2 times daily PRN 05/12/15 1823 05/12/15 1919   05/12/15 1430  vancomycin (VANCOCIN) IVPB 1000 mg/200 mL premix     1,000 mg 200 mL/hr over 60 Minutes Intravenous To ShortStay Surgical 05/11/15 1306 05/12/15 1535       Patient was given sequential compression devices and early ambulation to prevent DVT.   Patient benefited maximally from hospital stay and there were no complications. At the time of discharge, the patient was  urinating/moving their bowels without difficulty, tolerating a regular diet, pain is controlled with oral pain medications and they have been cleared by PT/OT.   Recent vital signs: No data found.    Recent laboratory studies: No results for input(s): WBC, HGB, HCT, PLT, NA, K, CL, CO2, BUN, CREATININE, GLUCOSE, INR, CALCIUM in the last 72 hours.  Invalid input(s): PT, 2   Discharge Medications:     Medication List    STOP taking these medications        lidocaine 5 %  Commonly known as:  LIDODERM     oxyCODONE 15 MG immediate release tablet  Commonly known as:  ROXICODONE     Probiotic Caps      TAKE these medications        acyclovir 800 MG tablet  Commonly known as:  ZOVIRAX  Take 800 mg by mouth 2 (two) times daily as needed (shingles).     BRINTELLIX 10 MG Tabs  Generic drug:  Vortioxetine HBr  Take 10 mg by mouth daily.     cetirizine 10 MG tablet  Commonly known as:  ZYRTEC  Take 10 mg by mouth at bedtime.     LORazepam 0.5 MG tablet  Commonly known as:  ATIVAN  Take 0.5 mg by mouth 2 (two) times daily as needed for anxiety.     methocarbamol 500 MG tablet  Commonly known as:  ROBAXIN  Take 1 tablet (500 mg total) by mouth every 6 (six) hours  as needed for muscle spasms.     oxyCODONE-acetaminophen 7.5-325 MG per tablet  Commonly known as:  PERCOCET  Take 1 tablet by mouth every 6 (six) hours as needed for severe pain.     simvastatin 20 MG tablet  Commonly known as:  ZOCOR  Take 20 mg by mouth at bedtime.        Diagnostic Studies: Dg Lumbar Spine 2-3 Views  05/12/2015   CLINICAL DATA:  59 year old female undergoing L4-L5 microdiskectomy  EXAM: LUMBAR SPINE - 2-3 VIEW  COMPARISON:  Lumbar spine MRI 04/20/2015  FINDINGS: Cross-table lateral radiographs of the lumbar spine demonstrates a metallic marker at the level of the L5 spinous process. On the second image, soft tissue spreaders are noted in posterior soft tissues. A metallic probe overlies the  superior articulating facet of L5.  IMPRESSION: Intraoperative localization radiographs as above.   Electronically Signed   By: Jacqulynn Cadet M.D.   On: 05/12/2015 16:08        Discharge Instructions    Call MD / Call 911    Complete by:  As directed   If you experience chest pain or shortness of breath, CALL 911 and be transported to the hospital emergency room.  If you develope a fever above 101 F, pus (white drainage) or increased drainage or redness at the wound, or calf pain, call your surgeon's office.     Constipation Prevention    Complete by:  As directed   Drink plenty of fluids.  Prune juice may be helpful.  You may use a stool softener, such as Colace (over the counter) 100 mg twice a day.  Use MiraLax (over the counter) for constipation as needed.     Diet - low sodium heart healthy    Complete by:  As directed      Discharge instructions    Complete by:  As directed   Ok to shower 5 days postop.  Do not apply any creams or ointments to incision.  Do not remove steri-strips.  Can use 4x4 gauze and tape for dressing changes.  No aggressive activity.  No bending, squatting or prolonged sitting.  Mostly be in reclined position or lying down.     Driving restrictions    Complete by:  As directed   No driving     Increase activity slowly as tolerated    Complete by:  As directed      Lifting restrictions    Complete by:  As directed   No lifting           Follow-up Information    Schedule an appointment as soon as possible for a visit with Marybelle Killings, MD.   Specialty:  Orthopedic Surgery   Why:  need return office visit one week postop   Contact information:   [place] [address] [phone]       Discharge Plan:  discharge to home  Disposition:     Signed: Lanae Crumbly 05/18/2015, 2:44 PM

## 2015-11-16 IMAGING — CR DG CHEST 2V
2 series · 2 of 2 positions shown · non-contrast
Comparison: 06/27/2010

CLINICAL DATA: Right lower chest pain.

EXAM:
CHEST  2 VIEW

[w chest pa]
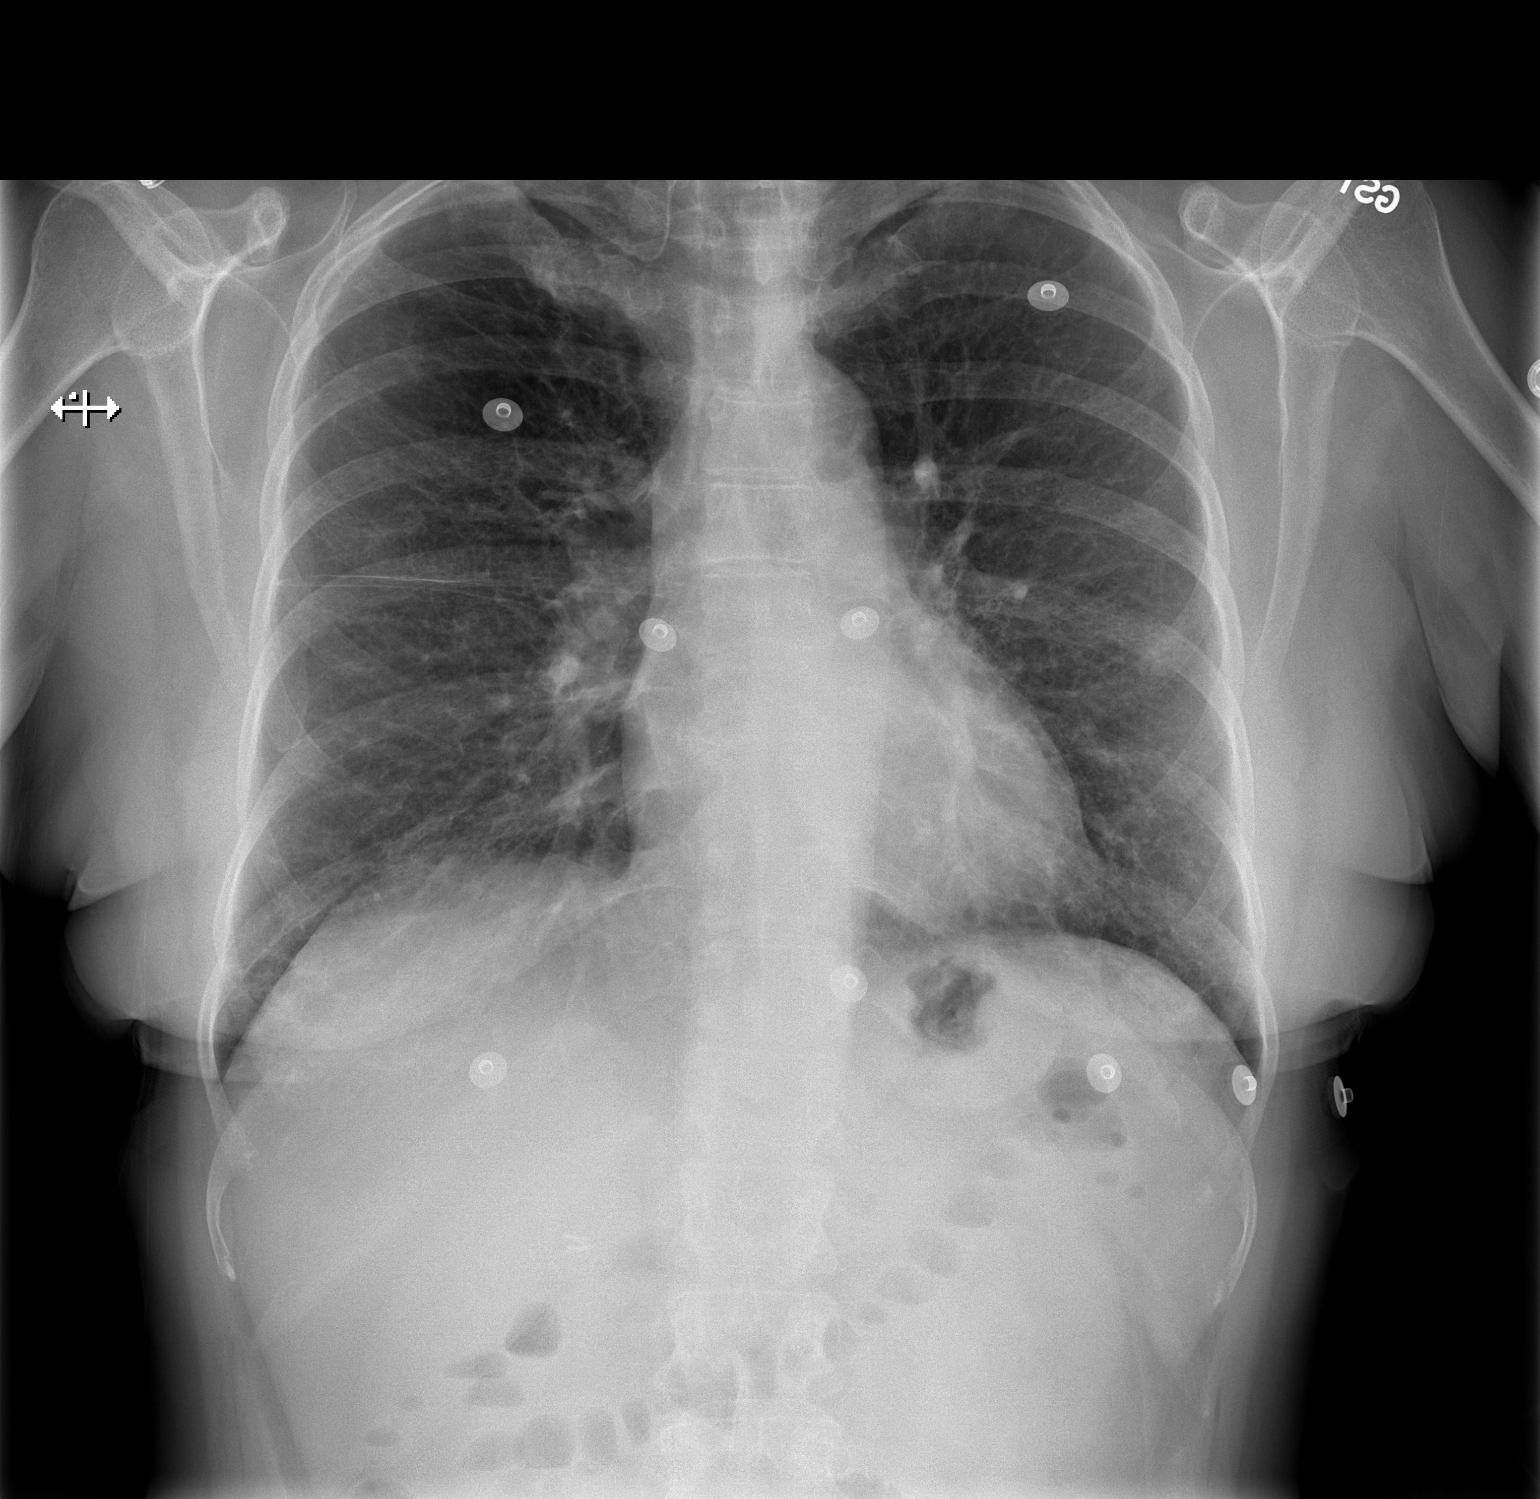

[w chest lat]
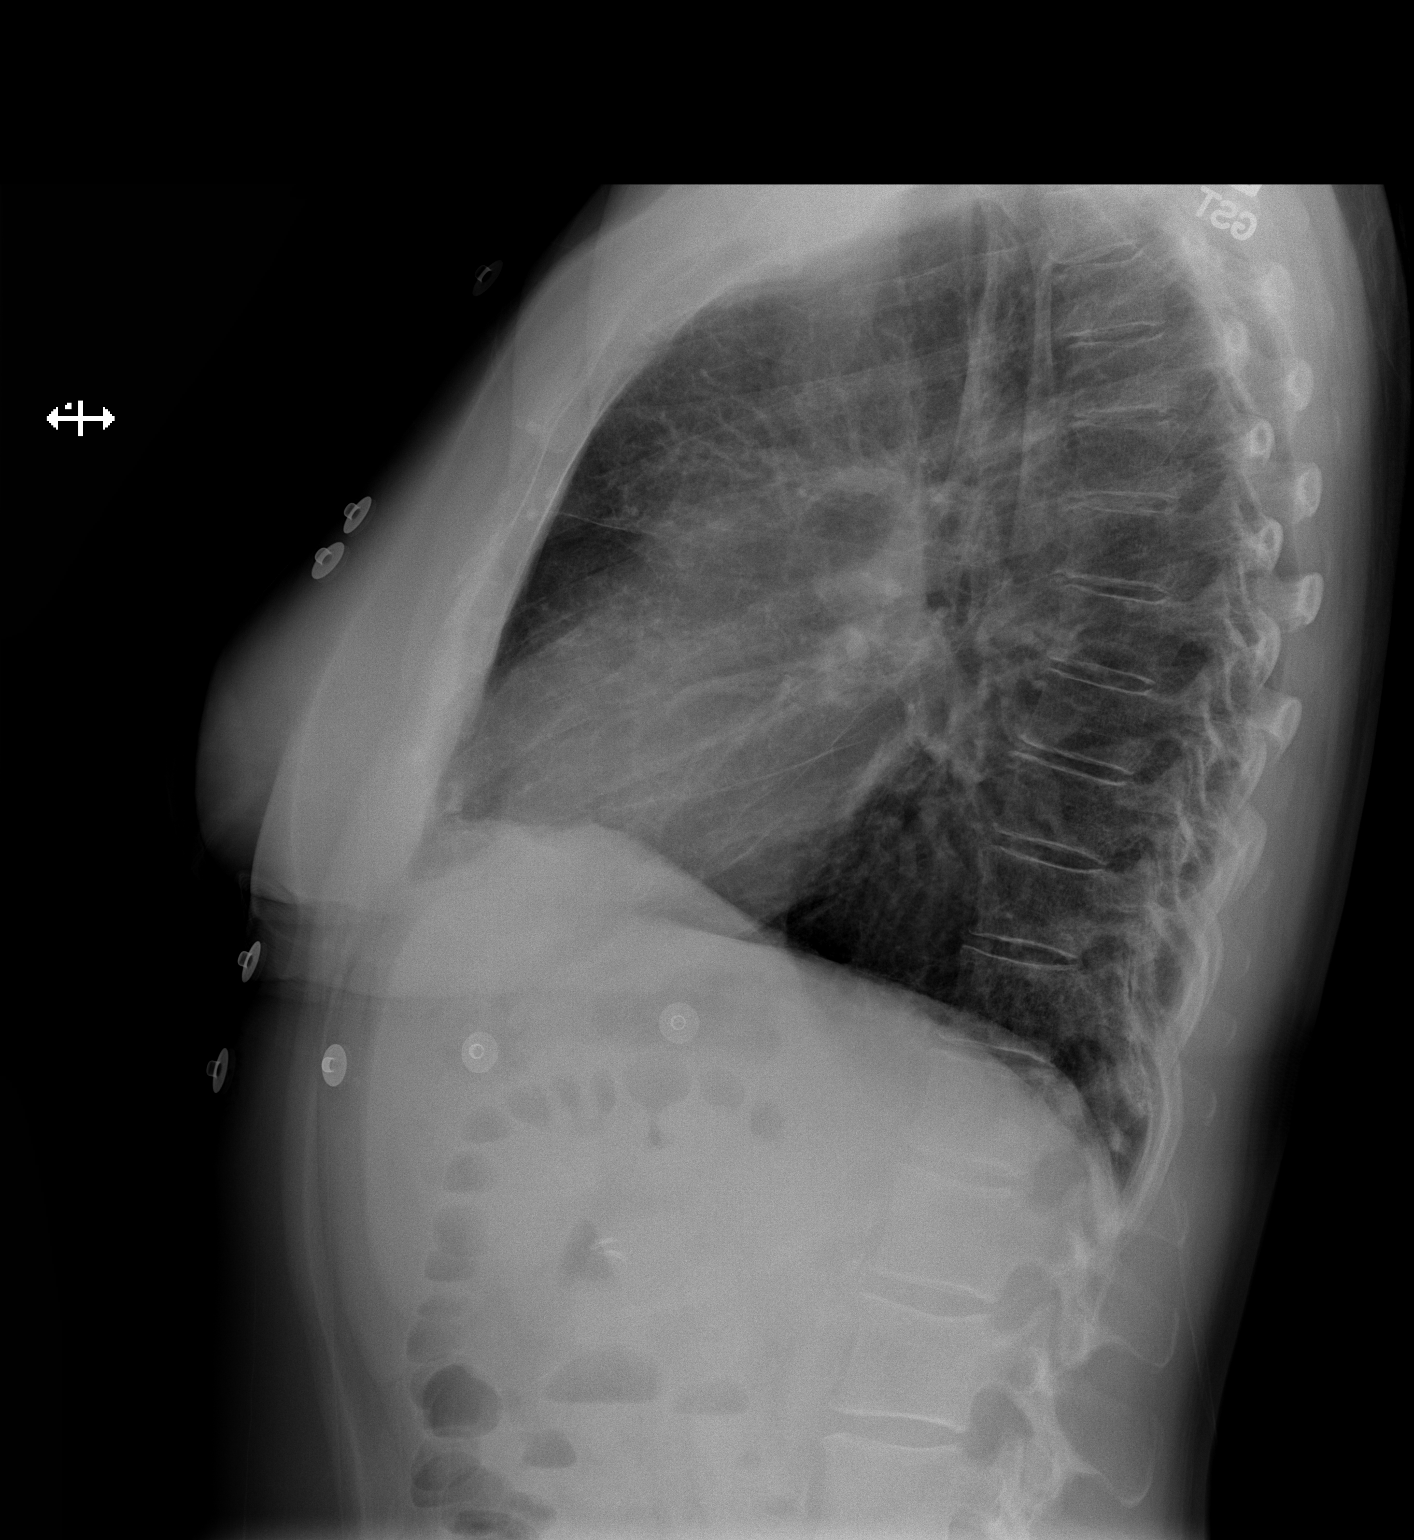

[2 of 2 positions shown; findings below may reference images not displayed]

FINDINGS: Cardiomediastinal contours within normal range. Interstitial
prominence. No confluent airspace opacity, pleural effusion, or
pneumothorax. Surgical clips right upper quadrant. No acute osseous
finding.
IMPRESSION: Mild interstitial prominence may reflect chronic change versus
atypical/viral infection if acute.

## 2015-12-01 IMAGING — CT CT ANGIO CHEST
2 of 8 series · 12 of 46 positions shown · IV contrast (OMNI)
Comparison: None.

CLINICAL DATA: Mid chest pain radiating in the back, question
aortic dissection

EXAM:
CT ANGIOGRAPHY CHEST WITH CONTRAST
TECHNIQUE: Multidetector CT imaging of the chest was performed using the
standard protocol during bolus administration of intravenous
contrast. Multiplanar CT image reconstructions and MIPs were
obtained to evaluate the vascular anatomy.
CONTRAST:  100mL OMNIPAQUE IOHEXOL 350 MG/ML SOLN

[Series 4: without 5.0 i31f 1 · axial · non-contrast · 0.66mm/px · z∈[+1142,+1382]mm · 9 of 61 slices shown]
[im 7/61  lung]
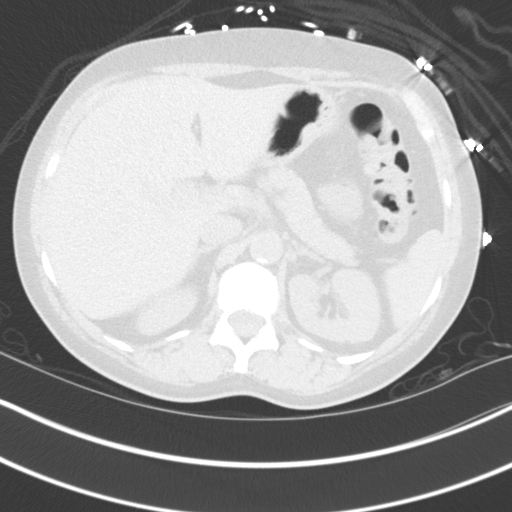
[im 13/61  soft-tissue]
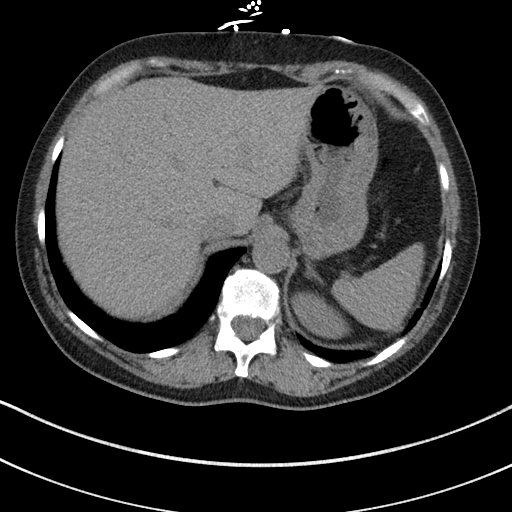
[im 19/61  lung]
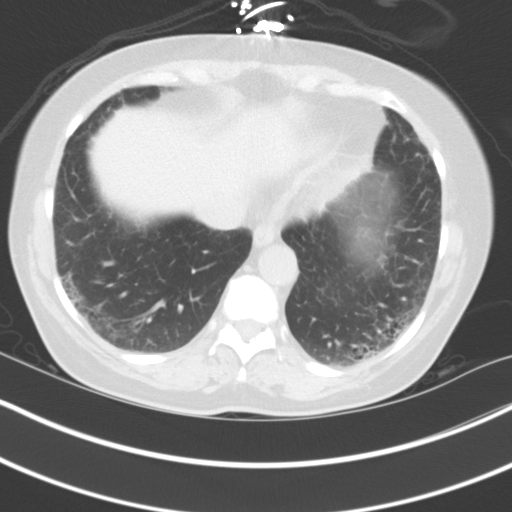
[im 25/61  soft-tissue]
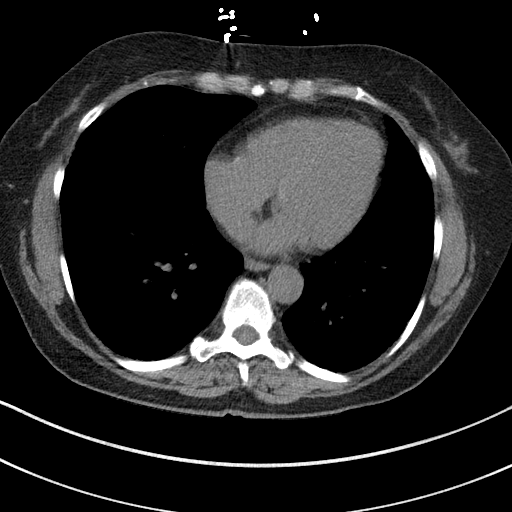
[im 31/61  lung]
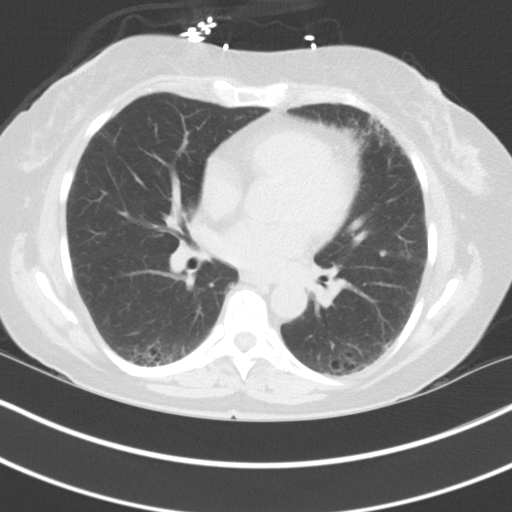
[im 37/61  soft-tissue]
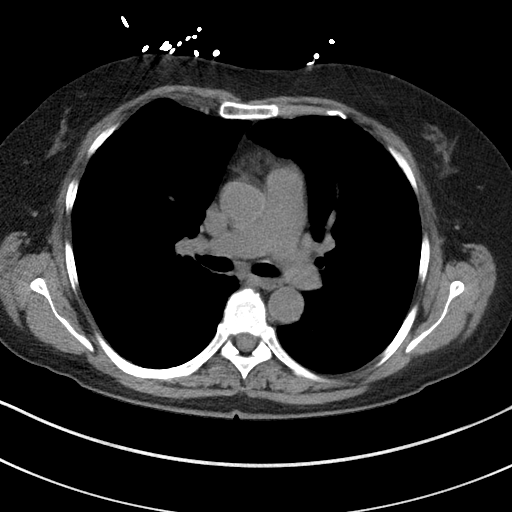
[im 43/61  lung]
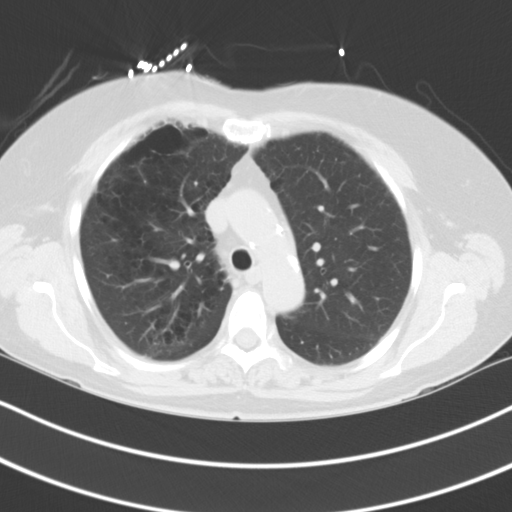
[im 49/61  soft-tissue]
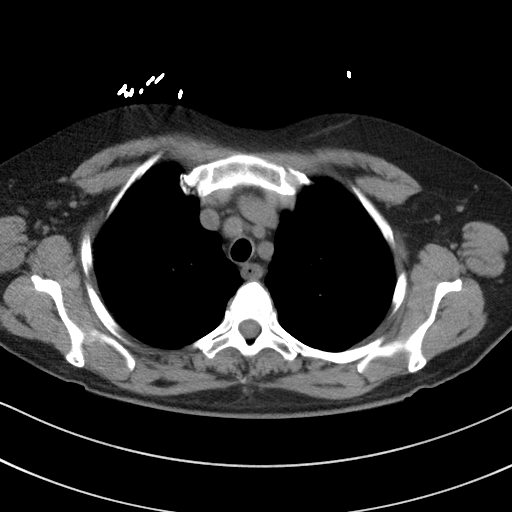
[im 55/61  lung]
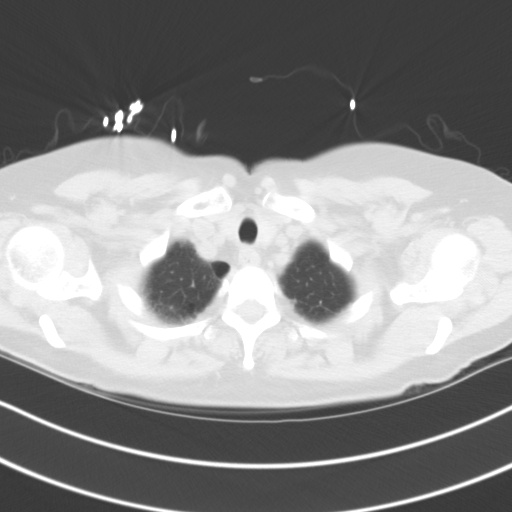

[Series 8: coronals · coronal · 0.59mm/px · 3 of 114 slices shown]
[im 29/114  soft-tissue]
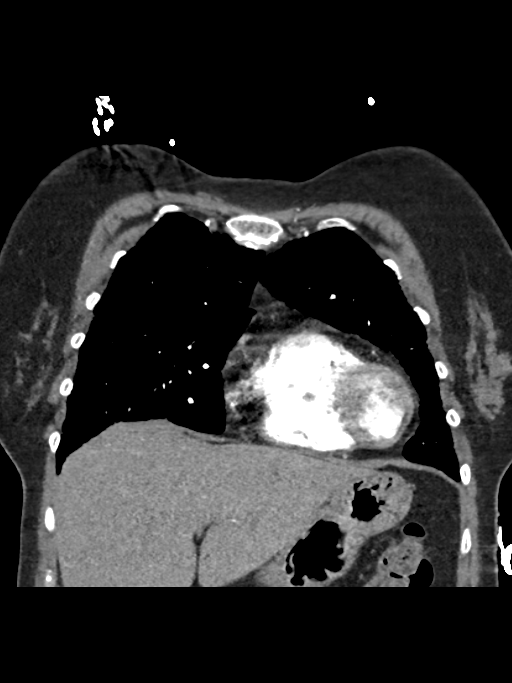
[im 57/114  soft-tissue]
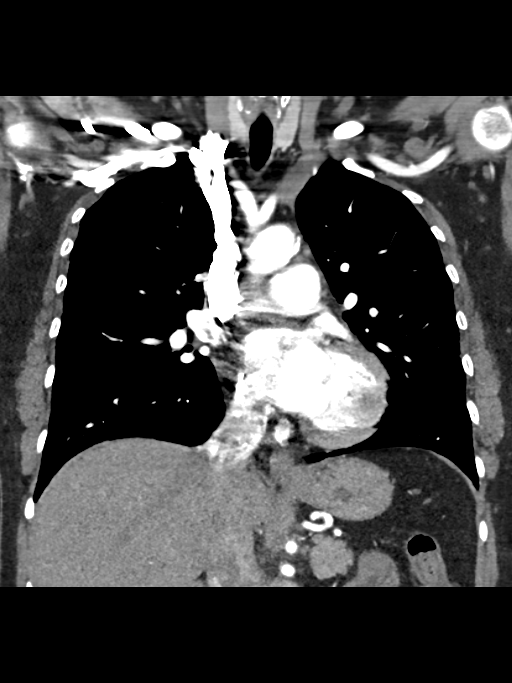
[im 85/114  soft-tissue]
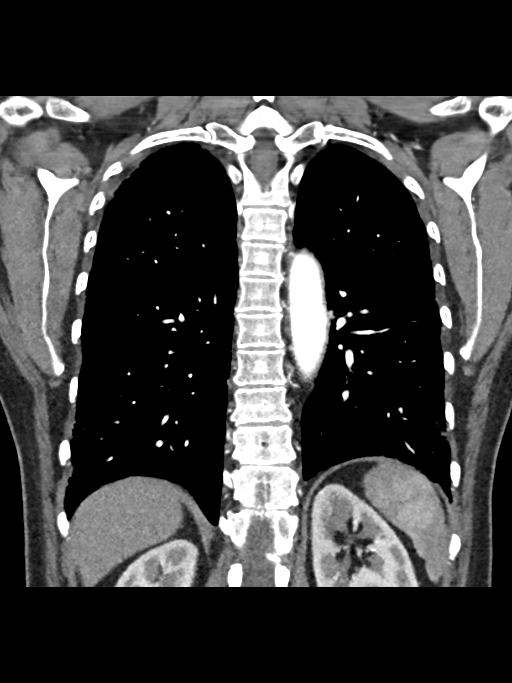

[12 of 46 positions shown; findings below may reference images not displayed]

FINDINGS: Unenhanced images shows atherosclerotic calcifications of thoracic
aorta. There are atherosclerotic calcifications of upper abdominal
aorta.

Sagittal images of the spine shows degenerative changes thoracic
spine.

There is no evidence of aortic aneurysm or dissection. Ascending
aorta measures 2.8 cm in diameter. Descending aorta measures 2.3 cm
in diameter. Heart size within normal limits. No pericardial
effusion is noted.

No pulmonary embolus is identified. There is no mediastinal hematoma
or adenopathy.

Status post cholecystectomy.

No hilar adenopathy is noted.

Images of the lung parenchyma shows bilateral emphysematous changes.
Centrilobular and paraseptal emphysematous bullae are noted
bilaterally.

There are peripheral fibrotic changes with paraseptal emphysematous
bullae bilateral lower lobe posteriorly.

No acute infiltrate or pulmonary edema. There is a nodule along the
left fissure in left lower lobe measures 5 mm.

Review of the MIP images confirms the above findings.
IMPRESSION: 1. There is no aortic aneurysm or aortic dissection.
2. No mediastinal hematoma or adenopathy.
3. Bilateral emphysematous changes are noted. Some fibrotic changes
with peripheral honeycombing noted bilateral lower lobe posteriorly.
4. There is a 5 mm nodule in left lower lobe just posterior to the
fissure. If the patient is at high risk for bronchogenic carcinoma,
follow-up chest CT at 6-12 months is recommended. If the patient is
at low risk for bronchogenic carcinoma, follow-up chest CT at 12
months is recommended. This recommendation follows the consensus
statement: Guidelines for Management of Small Pulmonary Nodules
Detected on CT Scans: A Statement from the [HOSPITAL] as
published in Radiology 8773;[DATE].
5. Status postcholecystectomy.

## 2016-07-15 ENCOUNTER — Ambulatory Visit (INDEPENDENT_AMBULATORY_CARE_PROVIDER_SITE_OTHER): Payer: 59

## 2016-07-15 DIAGNOSIS — Z23 Encounter for immunization: Secondary | ICD-10-CM

## 2016-12-18 ENCOUNTER — Ambulatory Visit (INDEPENDENT_AMBULATORY_CARE_PROVIDER_SITE_OTHER): Payer: 59 | Admitting: Pediatrics

## 2016-12-18 ENCOUNTER — Other Ambulatory Visit: Payer: Self-pay | Admitting: *Deleted

## 2016-12-18 ENCOUNTER — Encounter: Payer: Self-pay | Admitting: Pediatrics

## 2016-12-18 VITALS — BP 119/73 | HR 70 | Temp 97.7°F | Ht 63.0 in | Wt 150.6 lb

## 2016-12-18 DIAGNOSIS — Z0000 Encounter for general adult medical examination without abnormal findings: Secondary | ICD-10-CM | POA: Diagnosis not present

## 2016-12-18 DIAGNOSIS — R739 Hyperglycemia, unspecified: Secondary | ICD-10-CM

## 2016-12-18 DIAGNOSIS — Z720 Tobacco use: Secondary | ICD-10-CM | POA: Insufficient documentation

## 2016-12-18 DIAGNOSIS — G4700 Insomnia, unspecified: Secondary | ICD-10-CM | POA: Insufficient documentation

## 2016-12-18 DIAGNOSIS — E785 Hyperlipidemia, unspecified: Secondary | ICD-10-CM

## 2016-12-18 DIAGNOSIS — Z789 Other specified health status: Secondary | ICD-10-CM | POA: Insufficient documentation

## 2016-12-18 DIAGNOSIS — Z7289 Other problems related to lifestyle: Secondary | ICD-10-CM

## 2016-12-18 DIAGNOSIS — F39 Unspecified mood [affective] disorder: Secondary | ICD-10-CM | POA: Insufficient documentation

## 2016-12-18 LAB — BAYER DCA HB A1C WAIVED: HB A1C (BAYER DCA - WAIVED): 5.3 % (ref <7.0)

## 2016-12-18 MED ORDER — ZOLPIDEM TARTRATE 10 MG PO TABS: 10.0000 mg | ORAL_TABLET | Freq: Every evening | ORAL | 5 refills | Status: DC | PRN

## 2016-12-18 NOTE — Progress Notes (Signed)
Subjective:   Patient ID: Diane [name], female    DOB: August 14, 1956, 61 y.o.   MRN: [id number] CC: New Patient (Initial Visit)  HPI: Diane [name] is a 61 y.o. female presenting for New Patient (Initial Visit)  Allergies: taking flonase, zyrtec Doing ok, knows when she doesn't take it  Depression and anxiety: trintellix made her not feel very good Has been off of it for several days Felt nauseous a lot No energy Started about a year ago Has been on something for years Mood is better than what is has been Years ago in counseling  Insomnia: Has trouble sleeping when she doesn't take trintellix  HLD: taking simvastatin, no side effects  No h/o CVA, MI No fam hx of breast ca, colon ca  Pap smear last year: Dr Carmie End office, pt says was normal   Past Medical History:  Diagnosis Date  . Anxiety   . Arthritis    fingers, left foot  . C. difficile diarrhea 2015  . COPD (chronic obstructive pulmonary disease) ([place])   . Depression   . H pylori ulcer   . Headache(784.0)   . Hypercholesterolemia   . Kidney stones    20 years ago  . Pneumonia    5 years ago  . Seizures ([place])    2 years ago, "cluster of seizures" none since  . Shingles    Family History  Problem Relation Age of Onset  . Arthritis Mother   . Asthma Mother   . Depression Mother   . Hyperlipidemia Mother   . Varicose Veins Mother   . Arthritis Father   . Heart disease Father   . Hyperlipidemia Father   . Vision loss Father   . Cancer Sister   . COPD Sister   . Early death Sister   . Cancer Brother   . Alcohol abuse Maternal Grandfather    Social History   Social History  . Marital status: Married    Spouse name: [name]   . Number of children: 3  . Years of education: 12+   Occupational History  .  [employer]   Social History Main Topics  . Smoking status: Current Every Day Smoker    Packs/day: 0.50    Years: 30.00    Types: Cigarettes    Last attempt to  quit: 08/03/2014  . Smokeless tobacco: Never Used     Comment: 5-10 cigarettes a day (plans to quit prior to this surgery 05/06/15)  . Alcohol use 12.6 oz/week    21 Shots of liquor per week     Comment: 3 per day  . Drug use: No  . Sexual activity: Not Asked   Other Topics Concern  . None   Social History Narrative   The patient lives at home with husband [name].    Patient has 3 children.    Patient has he Associates Degree.    Patient is currently working.    ROS: All systems negative other than what is in HPI  Objective:    BP 119/73   Pulse 70   Temp 97.7 F (36.5 C) (Oral)   Ht 5' 3"  (1.6 m)   Wt 150 lb 9.6 oz (68.3 kg)   BMI 26.68 kg/m   Wt Readings from Last 3 Encounters:  12/18/16 150 lb 9.6 oz (68.3 kg)  05/12/15 138 lb (62.6 kg)  05/06/15 138 lb 4.8 oz (62.7 kg)    Gen: NAD, alert, cooperative with exam, NCAT EYES: EOMI, no conjunctival  injection, or no icterus ENT:  TMs pearly gray b/l, OP without erythema LYMPH: no cervical LAD CV: NRRR, normal S1/S2, no murmur, distal pulses 2+ b/l Resp: CTABL, no wheezes, normal WOB Abd: +BS, soft, NTND. no guarding or organomegaly Ext: No edema, warm Neuro: Alert and oriented, strength equal b/l UE and LE, coordination grossly normal MSK: normal muscle bulk  Assessment & Plan:  Diane [name] was seen today for new patient (initial visit).  Diagnoses and all orders for this visit:  Encounter for preventive health examination Check below labs Had mammogram last year, gets them every other year Had pap smear last year -     Hepatitis C antibody -     CBC with Differential -     TSH -     CMP14+EGFR  Mood disorder ([place]) Wants to try off of medicines Feels better of of trintellix than she has in a while  Insomnia, unspecified type Cont below, stopping depression medication, trying to decrease tobacco, alcohol use Discussed sleep hygiene, decrease caffeine, EtOH intake, increase physical activity -      zolpidem (AMBIEN) 10 MG tablet; Take 1 tablet (10 mg total) by mouth at bedtime as needed for sleep.  Hyperlipidemia, unspecified hyperlipidemia type -     Lipid panel  Hyperglycemia -     Bayer DCA Hb A1c Waived  Tobacco use Encouraged cessation, discussed strategies, interested in quitting, says she isnt ready yet  Alcohol use Drinking 3 drinks most evenings Encouraged decreasing, may becontributing to insomnia  Follow up plan: Return in about 6 months (around 06/20/2017) for med problem follow up. Assunta Found, MD [place]

## 2016-12-18 NOTE — Telephone Encounter (Signed)
Ambien Rx called into [facility]

## 2016-12-19 LAB — CBC WITH DIFFERENTIAL/PLATELET
BASOS: 0 %
Basophils Absolute: 0 10*3/uL (ref 0.0–0.2)
EOS (ABSOLUTE): 0.3 10*3/uL (ref 0.0–0.4)
EOS: 5 %
HEMATOCRIT: 38.7 % (ref 34.0–46.6)
HEMOGLOBIN: 12.9 g/dL (ref 11.1–15.9)
Immature Grans (Abs): 0 10*3/uL (ref 0.0–0.1)
Immature Granulocytes: 0 %
LYMPHS ABS: 1.8 10*3/uL (ref 0.7–3.1)
Lymphs: 34 %
MCH: 32.1 pg (ref 26.6–33.0)
MCHC: 33.3 g/dL (ref 31.5–35.7)
MCV: 96 fL (ref 79–97)
MONOCYTES: 6 %
Monocytes Absolute: 0.3 10*3/uL (ref 0.1–0.9)
Neutrophils Absolute: 2.9 10*3/uL (ref 1.4–7.0)
Neutrophils: 55 %
Platelets: 310 10*3/uL (ref 150–379)
RBC: 4.02 x10E6/uL (ref 3.77–5.28)
RDW: 13.3 % (ref 12.3–15.4)
WBC: 5.3 10*3/uL (ref 3.4–10.8)

## 2016-12-19 LAB — LIPID PANEL
CHOL/HDL RATIO: 3.2 ratio (ref 0.0–4.4)
Cholesterol, Total: 228 mg/dL — ABNORMAL HIGH (ref 100–199)
HDL: 71 mg/dL (ref >39)
LDL Calculated: 132 mg/dL — ABNORMAL HIGH (ref 0–99)
Triglycerides: 123 mg/dL (ref 0–149)
VLDL Cholesterol Cal: 25 mg/dL (ref 5–40)

## 2016-12-19 LAB — CMP14+EGFR
ALT: 13 IU/L (ref 0–32)
AST: 18 IU/L (ref 0–40)
Albumin/Globulin Ratio: 1.5 (ref 1.2–2.2)
Albumin: 4.5 g/dL (ref 3.5–5.5)
Alkaline Phosphatase: 81 IU/L (ref 39–117)
BUN/Creatinine Ratio: 21 (ref 9–23)
BUN: 13 mg/dL (ref 6–24)
Bilirubin Total: 0.3 mg/dL (ref 0.0–1.2)
CALCIUM: 9.3 mg/dL (ref 8.7–10.2)
CO2: 23 mmol/L (ref 18–29)
Chloride: 102 mmol/L (ref 96–106)
Creatinine, Ser: 0.62 mg/dL (ref 0.57–1.00)
GFR, EST AFRICAN AMERICAN: 114 mL/min/{1.73_m2} (ref >59)
GFR, EST NON AFRICAN AMERICAN: 99 mL/min/{1.73_m2} (ref >59)
GLUCOSE: 94 mg/dL (ref 65–99)
Globulin, Total: 3.1 g/dL (ref 1.5–4.5)
Potassium: 4.5 mmol/L (ref 3.5–5.2)
Sodium: 141 mmol/L (ref 134–144)
TOTAL PROTEIN: 7.6 g/dL (ref 6.0–8.5)

## 2016-12-19 LAB — HEPATITIS C ANTIBODY: Hep C Virus Ab: <0.1 s/co ratio (ref 0.0–0.9)

## 2016-12-19 LAB — TSH: TSH: 1.52 u[IU]/mL (ref 0.450–4.500)

## 2017-01-03 ENCOUNTER — Encounter: Payer: Self-pay | Admitting: Pediatrics

## 2017-01-10 ENCOUNTER — Ambulatory Visit (INDEPENDENT_AMBULATORY_CARE_PROVIDER_SITE_OTHER): Payer: 59 | Admitting: Pediatrics

## 2017-01-10 ENCOUNTER — Encounter: Payer: Self-pay | Admitting: Pediatrics

## 2017-01-10 VITALS — BP 128/82 | HR 77 | Temp 99.3°F | Ht 63.0 in | Wt 152.2 lb

## 2017-01-10 DIAGNOSIS — K219 Gastro-esophageal reflux disease without esophagitis: Secondary | ICD-10-CM

## 2017-01-10 DIAGNOSIS — R1013 Epigastric pain: Secondary | ICD-10-CM | POA: Diagnosis not present

## 2017-01-10 MED ORDER — OMEPRAZOLE 20 MG PO CPDR: 20.0000 mg | DELAYED_RELEASE_CAPSULE | Freq: Two times a day (BID) | ORAL | 3 refills | Status: DC

## 2017-01-10 NOTE — Progress Notes (Signed)
  Subjective:   Patient ID: Diane [name], female    DOB: 1956-08-23, 61 y.o.   MRN: [id number] CC: Abdominal Pain (upper)  HPI: Diane [name] is a 61 y.o. female presenting for Abdominal Pain (upper)  Having upper abdominal pain constantly for past two days Not taking anything OTC  Certain positions can lessen pain, she'll move around to find them, they change Lying down can make it worse Feels "belchier" than usual Says last night felt like burning Feels nauseous in the mornings past few days Within a few hours nausea goes away and can eat Having some headaches, achyness all over yestesrday Today just the upper abd pain Food doesn't make pain worse, sometimes feels better after eating Had H pylori a couple of years ago, had treatment Also has been told she has a hiatal hernia Has cut back on drinking etoh, not drinking on Sun-Tues Appetite has been down some because of pain, still eating regularly, some meals if pain intense will skip  C diff infection in the past, was treated Normal stooling now every day, no diarrhea, no blood in stool, no constipation h/o cholecystectomy, had diarrhea some with food after the surgery, that resolved years ago  Was on antibiotics briefly 2-3 weeks ago for tooth pain, had recent root canal, now pain improved  No fevers No congestion, cough or URI symptoms No swelling, SOB  Relevant past medical, surgical, family and social history reviewed. Allergies and medications reviewed and updated. History  Smoking Status  . Current Every Day Smoker  . Packs/day: 0.50  . Years: 30.00  . Types: Cigarettes  . Last attempt to quit: 08/03/2014  Smokeless Tobacco  . Never Used   ROS: Per HPI   Objective:    BP 128/82   Pulse 77   Temp 99.3 F (37.4 C) (Oral)   Ht '5\' 3"'$  (1.6 m)   Wt 152 lb 3.2 oz (69 kg)   BMI 26.96 kg/m   Wt Readings from Last 3 Encounters:  01/10/17 152 lb 3.2 oz (69 kg)  12/18/16 150 lb 9.6 oz (68.3 kg)    05/12/15 138 lb (62.6 kg)    Gen: NAD, alert, cooperative with exam, NCAT EYES: EOMI, no conjunctival injection, or no icterus ENT:  R TM obscurred by cerumen, L TM nl, OP without erythema LYMPH: no cervical LAD CV: NRRR, normal S1/S2, no murmur, distal pulses 2+ b/l Resp: CTABL, no wheezes, normal WOB Abd: +BS, soft, tender with palpation epigastric area, neg murphys sign, ND. no guarding or organomegaly Ext: No edema, warm Neuro: Alert and oriented, strength equal b/l UE and LE, coordination grossly normal MSK: normal muscle bulk  Assessment & Plan:  Diane [name] was seen today for abdominal pain.  Diagnoses and all orders for this visit:  Epigastric pain Constant, eases with food h/o EtOH use, has been cutting back recently, not drinking for 3-4 nights out of the week Will check labs, start PPI Return precautions dicussed, f/u 2 weeks -     CMP14+EGFR -     CBC with Differential/Platelet -     Lipase  Gastroesophageal reflux disease, esophagitis presence not specified -     omeprazole (PRILOSEC) 20 MG capsule; Take 1 capsule (20 mg total) by mouth 2 (two) times daily before a meal.   Follow up plan: Return in about 2 weeks (around 02/07/2017). Assunta Found, MD [place]

## 2017-01-10 NOTE — Patient Instructions (Signed)
Debrox drops for ear wax. 

## 2017-01-11 LAB — CMP14+EGFR
A/G RATIO: 1.3 (ref 1.2–2.2)
ALT: 13 IU/L (ref 0–32)
AST: 20 IU/L (ref 0–40)
Albumin: 4.4 g/dL (ref 3.5–5.5)
Alkaline Phosphatase: 73 IU/L (ref 39–117)
BUN/Creatinine Ratio: 13 (ref 9–23)
BUN: 8 mg/dL (ref 6–24)
Bilirubin Total: <0.2 mg/dL (ref 0.0–1.2)
CALCIUM: 9.4 mg/dL (ref 8.7–10.2)
CO2: 22 mmol/L (ref 18–29)
Chloride: 99 mmol/L (ref 96–106)
Creatinine, Ser: 0.63 mg/dL (ref 0.57–1.00)
GFR calc Af Amer: 114 mL/min/{1.73_m2} (ref >59)
GFR, EST NON AFRICAN AMERICAN: 98 mL/min/{1.73_m2} (ref >59)
GLUCOSE: 79 mg/dL (ref 65–99)
Globulin, Total: 3.4 g/dL (ref 1.5–4.5)
Potassium: 4.2 mmol/L (ref 3.5–5.2)
Sodium: 137 mmol/L (ref 134–144)
TOTAL PROTEIN: 7.8 g/dL (ref 6.0–8.5)

## 2017-01-11 LAB — CBC WITH DIFFERENTIAL/PLATELET
BASOS ABS: 0 10*3/uL (ref 0.0–0.2)
BASOS: 0 %
EOS (ABSOLUTE): 0.3 10*3/uL (ref 0.0–0.4)
Eos: 5 %
Hematocrit: 38.8 % (ref 34.0–46.6)
Hemoglobin: 13.3 g/dL (ref 11.1–15.9)
IMMATURE GRANS (ABS): 0 10*3/uL (ref 0.0–0.1)
IMMATURE GRANULOCYTES: 0 %
LYMPHS: 40 %
Lymphocytes Absolute: 2.1 10*3/uL (ref 0.7–3.1)
MCH: 31.8 pg (ref 26.6–33.0)
MCHC: 34.3 g/dL (ref 31.5–35.7)
MCV: 93 fL (ref 79–97)
MONOCYTES: 8 %
Monocytes Absolute: 0.4 10*3/uL (ref 0.1–0.9)
NEUTROS PCT: 47 %
Neutrophils Absolute: 2.6 10*3/uL (ref 1.4–7.0)
PLATELETS: 330 10*3/uL (ref 150–379)
RBC: 4.18 x10E6/uL (ref 3.77–5.28)
RDW: 13.2 % (ref 12.3–15.4)
WBC: 5.4 10*3/uL (ref 3.4–10.8)

## 2017-01-11 LAB — LIPASE: LIPASE: 25 U/L (ref 14–72)

## 2017-02-05 ENCOUNTER — Ambulatory Visit: Payer: 59 | Admitting: Family Medicine

## 2017-02-05 ENCOUNTER — Encounter: Payer: Self-pay | Admitting: Pediatrics

## 2017-02-05 ENCOUNTER — Other Ambulatory Visit: Payer: 59

## 2017-02-05 DIAGNOSIS — R110 Nausea: Secondary | ICD-10-CM

## 2017-02-05 DIAGNOSIS — R197 Diarrhea, unspecified: Secondary | ICD-10-CM

## 2017-02-05 DIAGNOSIS — A048 Other specified bacterial intestinal infections: Secondary | ICD-10-CM

## 2017-02-06 ENCOUNTER — Encounter: Payer: Self-pay | Admitting: Pediatrics

## 2017-02-06 ENCOUNTER — Other Ambulatory Visit: Payer: Self-pay | Admitting: *Deleted

## 2017-02-06 DIAGNOSIS — A048 Other specified bacterial intestinal infections: Secondary | ICD-10-CM

## 2017-02-06 DIAGNOSIS — R197 Diarrhea, unspecified: Secondary | ICD-10-CM

## 2017-02-06 DIAGNOSIS — R110 Nausea: Secondary | ICD-10-CM

## 2017-02-07 ENCOUNTER — Telehealth: Payer: Self-pay | Admitting: Pediatrics

## 2017-02-07 ENCOUNTER — Other Ambulatory Visit: Payer: 59

## 2017-02-07 DIAGNOSIS — R197 Diarrhea, unspecified: Secondary | ICD-10-CM

## 2017-02-07 MED ORDER — METRONIDAZOLE 500 MG PO TABS: 500.0000 mg | ORAL_TABLET | Freq: Three times a day (TID) | ORAL | 0 refills | Status: DC

## 2017-02-07 NOTE — Addendum Note (Signed)
Addended by: Eustaquio Maize on: 02/07/2017 03:56 PM   Modules accepted: Orders

## 2017-02-07 NOTE — Telephone Encounter (Signed)
Returned patient's phone call.  Patient states that stool kit is completed and will be turned in today.  Patient also states that she had average of 2 tbsps of bright red blood in stool today.  No hemorrhoids that patient is aware of.  Patient is very worried  

## 2017-02-07 NOTE — Telephone Encounter (Signed)
No fevers, +lower abd pain, less pain today, still running to bathroom with watery stool 15+ times a day h/o chronic C diff, last treated over two years ago, says she was last treated with metronidazole Symptoms started after recent course of antibiotics, have bene ongoing for apprx 2 weeks Is going to come in tomorrow morning for appt, pt to pick up stool sample kit today for C diff

## 2017-02-08 ENCOUNTER — Encounter: Payer: Self-pay | Admitting: Gastroenterology

## 2017-02-08 ENCOUNTER — Ambulatory Visit (INDEPENDENT_AMBULATORY_CARE_PROVIDER_SITE_OTHER): Payer: 59 | Admitting: Pediatrics

## 2017-02-08 ENCOUNTER — Encounter: Payer: Self-pay | Admitting: Pediatrics

## 2017-02-08 VITALS — BP 112/77 | HR 78 | Temp 97.5°F | Ht 63.0 in | Wt 148.2 lb

## 2017-02-08 DIAGNOSIS — R197 Diarrhea, unspecified: Secondary | ICD-10-CM | POA: Diagnosis not present

## 2017-02-08 DIAGNOSIS — K219 Gastro-esophageal reflux disease without esophagitis: Secondary | ICD-10-CM

## 2017-02-08 MED ORDER — OMEPRAZOLE 20 MG PO CPDR: 20.0000 mg | DELAYED_RELEASE_CAPSULE | Freq: Two times a day (BID) | ORAL | 1 refills | Status: DC

## 2017-02-08 NOTE — Progress Notes (Signed)
  Subjective:   Patient ID: Diane [name], female    DOB: August 17, 1956, 61 y.o.   MRN: [id number] CC: Follow-up diarrhea HPI: Diane [name] is a 61 y.o. female presenting for Follow-up  On clindamycin 3 weeks ago for 7-10 days for a tooth infection Now tooth has resolved Started diarrhea 2 weeks ago, 10+ stools daily Yesterday had one episode of blood in stools Says she ahsnt noticed since then if bloody or not  h/o C diff 2 years ago, stated after round of antibiotics Chronic infection, went through multiple rounds of abx at that time Was seen at [facility] then Also with h/o H pylori infection  abd pain present, feels crampy No fevers Appetite has been ok, avoiding eating because causes diarrhea Drinking plenty of fluids Still able to work from home but going to the bathroom constantly  Relevant past medical, surgical, family and social history reviewed. Allergies and medications reviewed and updated. History  Smoking Status  . Current Every Day Smoker  . Packs/day: 0.50  . Years: 30.00  . Types: Cigarettes  . Last attempt to quit: 08/03/2014  Smokeless Tobacco  . Never Used    Comment: 5-10 cigarettes a day (plans to quit prior to this surgery 05/06/15)   ROS: Per HPI   Objective:    BP 112/77   Pulse 78   Temp 97.5 F (36.4 C) (Oral)   Ht _0  (1.6 m)   Wt 148 lb 3.2 oz (67.2 kg)   BMI 26.25 kg/m   Wt Readings from Last 3 Encounters:  02/08/17 148 lb 3.2 oz (67.2 kg)  01/10/17 152 lb 3.2 oz (69 kg)  12/18/16 150 lb 9.6 oz (68.3 kg)    Gen: NAD, alert, well-appearing, cooperative with exam, NCAT EYES: EOMI, no conjunctival injection, or no icterus CV: NRRR, normal S1/S2, no murmur, distal pulses 2+ b/l Resp: CTABL, no wheezes, normal WOB Abd: +BS, soft, mildly tender with palpation, ND. no guarding or organomegaly Ext: No edema, warm Neuro: Alert and oriented  Assessment & Plan:  Diane [name] was seen today for follow-up  diarrhea.  Diagnoses and all orders for this visit:  Diarrhea, unspecified type h/o c diff, now with new frequent watery diarrhea, episode of blood in stool yesterday  c diff test pending Given severity of symptoms, start treatment with metronidazole while waiting results, will stop if negative Check labs Referral to GI -     CMP14+EGFR -     CBC with Differential/Platelet  Gastroesophageal reflux disease, esophagitis presence not specified On PPI   Follow up plan: Return if symptoms worsen or fail to improve. Assunta Found, MD [place]

## 2017-02-09 LAB — CMP14+EGFR
A/G RATIO: 1.3 (ref 1.2–2.2)
ALK PHOS: 85 IU/L (ref 39–117)
ALT: 14 IU/L (ref 0–32)
AST: 18 IU/L (ref 0–40)
Albumin: 4.2 g/dL (ref 3.5–5.5)
BILIRUBIN TOTAL: 0.3 mg/dL (ref 0.0–1.2)
BUN/Creatinine Ratio: 15 (ref 9–23)
BUN: 10 mg/dL (ref 6–24)
CHLORIDE: 100 mmol/L (ref 96–106)
CO2: 25 mmol/L (ref 18–29)
Calcium: 9.6 mg/dL (ref 8.7–10.2)
Creatinine, Ser: 0.65 mg/dL (ref 0.57–1.00)
GFR calc Af Amer: 112 mL/min/{1.73_m2} (ref >59)
GFR calc non Af Amer: 97 mL/min/{1.73_m2} (ref >59)
GLUCOSE: 86 mg/dL (ref 65–99)
Globulin, Total: 3.3 g/dL (ref 1.5–4.5)
POTASSIUM: 4.6 mmol/L (ref 3.5–5.2)
Sodium: 141 mmol/L (ref 134–144)
TOTAL PROTEIN: 7.5 g/dL (ref 6.0–8.5)

## 2017-02-09 LAB — CBC WITH DIFFERENTIAL/PLATELET
BASOS ABS: 0 10*3/uL (ref 0.0–0.2)
Basos: 0 %
EOS (ABSOLUTE): 0.3 10*3/uL (ref 0.0–0.4)
Eos: 4 %
Hematocrit: 38.9 % (ref 34.0–46.6)
Hemoglobin: 13 g/dL (ref 11.1–15.9)
Immature Grans (Abs): 0 10*3/uL (ref 0.0–0.1)
Immature Granulocytes: 0 %
LYMPHS ABS: 2 10*3/uL (ref 0.7–3.1)
LYMPHS: 25 %
MCH: 31.5 pg (ref 26.6–33.0)
MCHC: 33.4 g/dL (ref 31.5–35.7)
MCV: 94 fL (ref 79–97)
MONOS ABS: 0.6 10*3/uL (ref 0.1–0.9)
Monocytes: 7 %
NEUTROS ABS: 5.1 10*3/uL (ref 1.4–7.0)
Neutrophils: 64 %
PLATELETS: 408 10*3/uL — AB (ref 150–379)
RBC: 4.13 x10E6/uL (ref 3.77–5.28)
RDW: 12.6 % (ref 12.3–15.4)
WBC: 8.1 10*3/uL (ref 3.4–10.8)

## 2017-02-09 LAB — CLOSTRIDIUM DIFFICILE EIA: C difficile Toxins A+B, EIA: NEGATIVE

## 2017-02-09 LAB — FECAL OCCULT BLOOD, IMMUNOCHEMICAL: Fecal Occult Bld: POSITIVE — AB

## 2017-02-14 ENCOUNTER — Ambulatory Visit (INDEPENDENT_AMBULATORY_CARE_PROVIDER_SITE_OTHER): Payer: 59 | Admitting: Gastroenterology

## 2017-02-14 ENCOUNTER — Encounter: Payer: Self-pay | Admitting: Gastroenterology

## 2017-02-14 VITALS — BP 124/80 | HR 66 | Ht 63.0 in | Wt 150.0 lb

## 2017-02-14 DIAGNOSIS — K6289 Other specified diseases of anus and rectum: Secondary | ICD-10-CM

## 2017-02-14 DIAGNOSIS — R1084 Generalized abdominal pain: Secondary | ICD-10-CM | POA: Diagnosis not present

## 2017-02-14 DIAGNOSIS — R197 Diarrhea, unspecified: Secondary | ICD-10-CM | POA: Diagnosis not present

## 2017-02-14 DIAGNOSIS — K625 Hemorrhage of anus and rectum: Secondary | ICD-10-CM | POA: Diagnosis not present

## 2017-02-14 MED ORDER — HYDROCORTISONE ACETATE 25 MG RE SUPP: 25.0000 mg | Freq: Two times a day (BID) | RECTAL | 0 refills | Status: DC

## 2017-02-14 MED ORDER — NA SULFATE-K SULFATE-MG SULF 17.5-3.13-1.6 GM/177ML PO SOLN: 1.0000 | ORAL | 0 refills | Status: DC

## 2017-02-14 MED ORDER — DICYCLOMINE HCL 10 MG PO CAPS: 10.0000 mg | ORAL_CAPSULE | Freq: Two times a day (BID) | ORAL | 2 refills | Status: DC

## 2017-02-14 NOTE — Patient Instructions (Addendum)
You have been scheduled for a colonoscopy. Please follow written instructions given to you at your visit today.  Please pick up your prep supplies at the pharmacy within the next 1-3 days. If you use inhalers (even only as needed), please bring them with you on the day of your procedure. Your physician has requested that you go to www.startemmi.com and enter the access code given to you at your visit today. This web site gives a general overview about your procedure. However, you should still follow specific instructions given to you by our office regarding your preparation for the procedure.  You may have a light breakfast the morning of prep day (the day before the procedure).   You may choose from one of the following items: eggs and toast or chicken noodle soup and crackers.   You should have your breakfast completed between 8:00 and 9:00 am the day before your procedure.  After you have had your light breakfast you should start a clear liquid dietonly NO SOLIDS. No additional solid food is allowed. You may continue to have clear liquid up to 3 hours prior to your procedure.    We have sent the following medications to your pharmacy for you to pick up at your convenience:  Bentyl 10 mg twice a day  Hydrocortisone suppositories twice a day

## 2017-02-14 NOTE — Progress Notes (Signed)
02/15/2017 Diane [name] [id number] [birth year]   HISTORY OF PRESENT ILLNESS:  This is a 61 year old female who is new to our office. She was referred here by her PCP, Dr. Assunta Found, for evaluation regarding diarrhea. The patient tells me that she's had episodes of diarrhea that would come and go over the course of several years. More recently they have become much more frequent, almost occurring on a daily basis and have become very violent and explosive with a lot of urgency. She says that she has multiple episodes of diarrhea a day. Has lower abdominal cramping. She has had moderate amounts of blood in her stool. This last episode began about 2 weeks ago. It has started to calm down some in regards to the diarrhea, but she still continues with a lot of discomfort/soreness in her abdomen. Stool study for C. difficile was negative.  She was FOBT positive.  CBC, CMP, lipase, TSH all unremarkable.  The patient had a colonoscopy by Dr. Buford Dresser and August 2013 at which time she is found to have some polyps that were removed. These were both tubular adenomas and hyperplastic polyps.   Past Medical History:  Diagnosis Date  . Anxiety   . Arthritis    fingers, left foot  . C. difficile diarrhea 2015  . COPD (chronic obstructive pulmonary disease) ([place])   . Depression   . H pylori ulcer   . Headache(784.0)   . Hypercholesterolemia   . Kidney stones    20 years ago  . Pneumonia    5 years ago  . Seizures ([place])    2 years ago, "cluster of seizures" none since  . Shingles    Past Surgical History:  Procedure Laterality Date  . ABDOMINAL HYSTERECTOMY    . APPENDECTOMY    . CESAREAN SECTION    . CHOLECYSTECTOMY    . COLONOSCOPY  06/10/2012   RMR: Colonic polyps -removed as described above.   Marland Kitchen KNEE ARTHROSCOPY  1973   right knee,  torn cart  . LUMBAR DISC SURGERY  05/12/2015   L4 L5  . LUMBAR LAMINECTOMY/DECOMPRESSION MICRODISCECTOMY Left 05/12/2015   Procedure: Left L4-5  Microdiscectomy;  Surgeon: Marybelle Killings, MD;  Location: [facility];  Service: Orthopedics;  Laterality: Left;  . TONSILLECTOMY      reports that she has been smoking Cigarettes.  She has a 15.00 pack-year smoking history. She has never used smokeless tobacco. She reports that she drinks about 12.6 oz of alcohol per week . She reports that she does not use drugs. family history includes Alcohol abuse in her maternal grandfather; Arthritis in her father and mother; Asthma in her mother; COPD in her sister; Cancer in her brother and sister; Depression in her mother; Early death in her sister; Heart disease in her father; Hyperlipidemia in her father and mother; Varicose Veins in her mother; Vision loss in her father. Allergies  Allergen Reactions  . Penicillins     Unknown type of reaction  . Keppra [Levetiracetam]     irritability      Outpatient Encounter Prescriptions as of 02/14/2017  Medication Sig  . cetirizine (ZYRTEC) 10 MG tablet Take 10 mg by mouth at bedtime.   . fluticasone (FLONASE) 50 MCG/ACT nasal spray Place into both nostrils daily.  Marland Kitchen omeprazole (PRILOSEC) 20 MG capsule Take 1 capsule (20 mg total) by mouth 2 (two) times daily before a meal.  . simvastatin (ZOCOR) 20 MG tablet Take 20 mg by mouth at bedtime.  Marland Kitchen  zolpidem (AMBIEN) 10 MG tablet Take 1 tablet (10 mg total) by mouth at bedtime as needed for sleep.  Marland Kitchen dicyclomine (BENTYL) 10 MG capsule Take 1 capsule (10 mg total) by mouth 2 (two) times daily.  . hydrocortisone (ANUSOL-HC) 25 MG suppository Place 1 suppository (25 mg total) rectally 2 (two) times daily.  . Na Sulfate-K Sulfate-Mg Sulf (SUPREP BOWEL PREP KIT) 17.5-3.13-1.6 GM/180ML SOLN Take 1 kit by mouth as directed.  . [DISCONTINUED] metroNIDAZOLE (FLAGYL) 500 MG tablet Take 1 tablet (500 mg total) by mouth 3 (three) times daily. (Patient not taking: Reported on 02/14/2017)   No facility-administered encounter medications on file as of 02/14/2017.      REVIEW OF SYSTEMS   : All other systems reviewed and negative except where noted in the History of Present Illness.   PHYSICAL EXAM: BP 124/80   Pulse 66   Ht 5' 3"  (1.6 m)   Wt 150 lb (68 kg)   SpO2 99%   BMI 26.57 kg/m  General: Well developed white female in no acute distress Head: Normocephalic and atraumatic Eyes:  Sclerae anicteric, conjunctiva pink. Ears: Normal auditory acuity Lungs: Clear throughout to auscultation Heart: Regular rate and rhythm Abdomen: Soft, non-distended. Normal bowel sounds.  Mild diffuse TTP. Rectal:  No external abnormalities noted.  Diffuse tenderness on DRE.  No definite fissure seen, anoscopy not attempted.  No masses felt.  Musculoskeletal: Symmetrical with no gross deformities  Skin: No lesions on visible extremities Extremities: No edema  Neurological: Alert oriented x 4, grossly non-focal Psychological:  Alert and cooperative. Normal mood and affect  ASSESSMENT AND PLAN: -61 year old female with complaints of rectal bleeding, rectal plain, diarrhea, generalized abdominal pain.  Colonoscopy by Dr. Gala Romney close to 5 years ago was unremarkable.  Will schedule for colonoscopy with Dr. Silverio Decamp for evaluation.  Will have her try Bentyl 10 mg BID for abdominal cramping in the interim.  Will try hydrocortisone suppositories BID for 7 days for the rectal pain.   *The risks, benefits, and alternatives to colonoscopy were discussed with the patient and she consents to proceed.   CC:  Eustaquio Maize, MD

## 2017-02-15 ENCOUNTER — Encounter: Payer: Self-pay | Admitting: Gastroenterology

## 2017-02-15 DIAGNOSIS — K6289 Other specified diseases of anus and rectum: Secondary | ICD-10-CM | POA: Insufficient documentation

## 2017-02-15 DIAGNOSIS — R1084 Generalized abdominal pain: Secondary | ICD-10-CM | POA: Insufficient documentation

## 2017-02-15 DIAGNOSIS — R197 Diarrhea, unspecified: Secondary | ICD-10-CM | POA: Insufficient documentation

## 2017-02-15 DIAGNOSIS — K625 Hemorrhage of anus and rectum: Secondary | ICD-10-CM | POA: Insufficient documentation

## 2017-02-18 ENCOUNTER — Encounter: Payer: Self-pay | Admitting: Pediatrics

## 2017-02-21 ENCOUNTER — Encounter: Payer: 59 | Admitting: Gastroenterology

## 2017-02-21 NOTE — Progress Notes (Signed)
Reviewed and agree with documentation and assessment and plan. K. Veena Nandigam , MD   

## 2017-02-23 ENCOUNTER — Ambulatory Visit (AMBULATORY_SURGERY_CENTER): Payer: 59 | Admitting: Gastroenterology

## 2017-02-23 ENCOUNTER — Encounter: Payer: Self-pay | Admitting: Gastroenterology

## 2017-02-23 VITALS — BP 139/69 | HR 58 | Temp 99.1°F | Resp 15 | Ht 63.0 in | Wt 150.0 lb

## 2017-02-23 DIAGNOSIS — R197 Diarrhea, unspecified: Secondary | ICD-10-CM

## 2017-02-23 DIAGNOSIS — K625 Hemorrhage of anus and rectum: Secondary | ICD-10-CM

## 2017-02-23 DIAGNOSIS — K635 Polyp of colon: Secondary | ICD-10-CM

## 2017-02-23 DIAGNOSIS — D126 Benign neoplasm of colon, unspecified: Secondary | ICD-10-CM

## 2017-02-23 DIAGNOSIS — D124 Benign neoplasm of descending colon: Secondary | ICD-10-CM

## 2017-02-23 DIAGNOSIS — D125 Benign neoplasm of sigmoid colon: Secondary | ICD-10-CM

## 2017-02-23 MED ORDER — SODIUM CHLORIDE 0.9 % IV SOLN: 500.0000 mL | INTRAVENOUS | Status: DC

## 2017-02-23 NOTE — Progress Notes (Signed)
Patient awakening,vss,report to rn 

## 2017-02-23 NOTE — Op Note (Addendum)
[facility] Patient Name: Diane [name] Procedure Date: 02/23/2017 11:28 AM MRN: [id number] Endoscopist: Mauri Pole , MD Age: 61 Referring MD:  Date of Birth: [birth year] Gender: Female Account #: [id number] Procedure:                Colonoscopy Indications:              Evaluation of unexplained GI bleeding, Clinically                            significant diarrhea of unexplained origin Medicines:                Monitored Anesthesia Care Procedure:                Pre-Anesthesia Assessment:                           - Prior to the procedure, a History and Physical                            was performed, and patient medications and                            allergies were reviewed. The patient's tolerance of                            previous anesthesia was also reviewed. The risks                            and benefits of the procedure and the sedation                            options and risks were discussed with the patient.                            All questions were answered, and informed consent                            was obtained. Prior Anticoagulants: The patient has                            taken no previous anticoagulant or antiplatelet                            agents. ASA Grade Assessment: II - A patient with                            mild systemic disease. After reviewing the risks                            and benefits, the patient was deemed in                            satisfactory condition to undergo the procedure.  After obtaining informed consent, the colonoscope                            was passed under direct vision. Throughout the                            procedure, the patient's blood pressure, pulse, and                            oxygen saturations were monitored continuously. The                            Model CF-HQ190L [id number]) scope was introduced                             through the anus and advanced to the the terminal                            ileum, with identification of the appendiceal                            orifice and IC valve. The colonoscopy was performed                            without difficulty. The patient tolerated the                            procedure well. The quality of the bowel                            preparation was good. The terminal ileum, ileocecal                            valve, appendiceal orifice, and rectum were                            photographed. Scope In: 11:33:45 AM Scope Out: 11:56:23 AM Scope Withdrawal Time: 0 hours 19 minutes 38 seconds  Total Procedure Duration: 0 hours 22 minutes 38 seconds  Findings:                 A single medium-sized localized angioectasia                            without bleeding was found in the cecum.                           Weak anal sphincter                           A 7 mm polyp was found in the descending colon. The                            polyp was sessile. The polyp was removed with a  cold snare. Resection and retrieval were complete.                           A 4 mm polyp was found in the sigmoid colon. The                            polyp was sessile. The polyp was removed with a                            cold biopsy forceps. Resection and retrieval were                            complete.                           A few localized non-bleeding erosions were found in                            the recto-sigmoid colon.                           Biopsies for histology were taken with a cold                            forceps from the right colon and left colon for                            evaluation of microscopic colitis.                           Retroflexion could not performed in the rectum due                            to narrow rectal vault and poor anal sphincter tone Complications:            No immediate  complications. Estimated Blood Loss:     Estimated blood loss was minimal. Impression:               - A single non-bleeding colonic angioectasia.                           - One 7 mm polyp in the descending colon, removed                            with a cold snare. Resected and retrieved.                           - One 4 mm polyp in the sigmoid colon, removed with                            a cold biopsy forceps. Resected and retrieved.                           - A few erosions in the recto-sigmoid  colon.                           - Biopsies were taken with a cold forceps from the                            right colon and left colon for evaluation of                            microscopic colitis. Recommendation:           - Patient has a contact number available for                            emergencies. The signs and symptoms of potential                            delayed complications were discussed with the                            patient. Return to normal activities tomorrow.                            Written discharge instructions were provided to the                            patient.                           - Resume previous diet.                           - Continue present medications.                           - Await pathology results.                           - Repeat colonoscopy date to be determined after                            pending pathology results are reviewed for                            surveillance based on pathology results.                           - Return to GI clinic PRN.                           - No aspirin, ibuprofen, naproxen, or other                            non-steroidal anti-inflammatory drugs.                           - Refer to Filomena Jungling for pelvic PT  to improve                            sphincter tone Mauri Pole, MD 02/23/2017 12:03:13 PM This report has been signed electronically.

## 2017-02-23 NOTE — Patient Instructions (Addendum)
YOU HAD AN ENDOSCOPIC PROCEDURE TODAY AT [facility]:   Refer to the procedure report that was given to you for any specific questions about what was found during the examination.  If the procedure report does not answer your questions, please call your gastroenterologist to clarify.  If you requested that your care partner not be given the details of your procedure findings, then the procedure report has been included in a sealed envelope for you to review at your convenience later.  YOU SHOULD EXPECT: Some feelings of bloating in the abdomen. Passage of more gas than usual.  Walking can help get rid of the air that was put into your GI tract during the procedure and reduce the bloating. If you had a lower endoscopy (such as a colonoscopy or flexible sigmoidoscopy) you may notice spotting of blood in your stool or on the toilet paper. If you underwent a bowel prep for your procedure, you may not have a normal bowel movement for a few days.  Please Note:  You might notice some irritation and congestion in your nose or some drainage.  This is from the oxygen used during your procedure.  There is no need for concern and it should clear up in a day or so.  SYMPTOMS TO REPORT IMMEDIATELY:   Following lower endoscopy (colonoscopy or flexible sigmoidoscopy):  Excessive amounts of blood in the stool  Significant tenderness or worsening of abdominal pains  Swelling of the abdomen that is new, acute  Fever of 100F or higher    For urgent or emergent issues, a gastroenterologist can be reached at any hour by calling [phone].   DIET:  We do recommend a small meal at first, but then you may proceed to your regular diet.  Drink plenty of fluids but you should avoid alcoholic beverages for 24 hours.  ACTIVITY:  You should plan to take it easy for the rest of today and you should NOT DRIVE or use heavy machinery until tomorrow (because of the sedation medicines used during the test).     FOLLOW UP: Our staff will call the number listed on your records the next business day following your procedure to check on you and address any questions or concerns that you may have regarding the information given to you following your procedure. If we do not reach you, we will leave a message.  However, if you are feeling well and you are not experiencing any problems, there is no need to return our call.  We will assume that you have returned to your regular daily activities without incident.  If any biopsies were taken you will be contacted by phone or by letter within the next 1-3 weeks.  Please call us at [phone] if you have not heard about the biopsies in 3 weeks.    SIGNATURES/CONFIDENTIALITY: You and/or your care partner have signed paperwork which will be entered into your electronic medical record.  These signatures attest to the fact that that the information above on your After Visit Summary has been reviewed and is understood.  Full responsibility of the confidentiality of this discharge information lies with you and/or your care-partner.    No  Aspirin,ibuprofen,naproxen or other non-steroidal anti-inflammatory drugs,resume  Remainder of medications. Information given on polyps.

## 2017-02-23 NOTE — Progress Notes (Signed)
Pt's states no medical or surgical changes since  office visit. 

## 2017-02-23 NOTE — Progress Notes (Signed)
Called to room to assist during endoscopic procedure.  Patient ID and intended procedure confirmed with present staff. Received instructions for my participation in the procedure from the performing physician.  

## 2017-02-26 ENCOUNTER — Telehealth: Payer: Self-pay

## 2017-02-26 NOTE — Telephone Encounter (Signed)
  Follow up Call-  Call back number 02/23/2017  Post procedure Call Back phone  # [phone]  Permission to leave phone message Yes  Some recent data might be hidden     Patient questions:  Do you have a fever, pain , or abdominal swelling? No. Pain Score  0 *  Have you tolerated food without any problems? Yes.    Have you been able to return to your normal activities? Yes.    Do you have any questions about your discharge instructions: Diet   No. Medications  No. Follow up visit  No.  Do you have questions or concerns about your Care? No.  Actions: * If pain score is 4 or above: No action needed, pain <4.

## 2017-02-28 ENCOUNTER — Encounter: Payer: Self-pay | Admitting: Gastroenterology

## 2017-03-08 ENCOUNTER — Other Ambulatory Visit: Payer: Self-pay

## 2017-03-08 DIAGNOSIS — R198 Other specified symptoms and signs involving the digestive system and abdomen: Secondary | ICD-10-CM

## 2017-05-31 ENCOUNTER — Encounter: Payer: Self-pay | Admitting: Internal Medicine

## 2017-07-20 ENCOUNTER — Encounter: Payer: Self-pay | Admitting: Pediatrics

## 2017-07-26 ENCOUNTER — Encounter: Payer: Self-pay | Admitting: Pediatrics

## 2017-07-26 MED ORDER — FLUTICASONE PROPIONATE 50 MCG/ACT NA SUSP: 2.0000 | Freq: Every day | NASAL | 1 refills | Status: DC

## 2017-07-26 MED ORDER — SIMVASTATIN 20 MG PO TABS: 20.0000 mg | ORAL_TABLET | Freq: Every day | ORAL | 1 refills | Status: DC

## 2017-07-27 NOTE — Telephone Encounter (Signed)
Patient aware , must schedule an appointment for ambein refills.

## 2017-08-03 ENCOUNTER — Ambulatory Visit (INDEPENDENT_AMBULATORY_CARE_PROVIDER_SITE_OTHER): Payer: 59 | Admitting: Pediatrics

## 2017-08-03 DIAGNOSIS — Z23 Encounter for immunization: Secondary | ICD-10-CM | POA: Diagnosis not present

## 2017-08-03 DIAGNOSIS — K219 Gastro-esophageal reflux disease without esophagitis: Secondary | ICD-10-CM | POA: Diagnosis not present

## 2017-08-03 DIAGNOSIS — G4700 Insomnia, unspecified: Secondary | ICD-10-CM | POA: Diagnosis not present

## 2017-08-03 MED ORDER — ZOLPIDEM TARTRATE 10 MG PO TABS: 10.0000 mg | ORAL_TABLET | Freq: Every evening | ORAL | 5 refills | Status: DC | PRN

## 2017-08-03 MED ORDER — OMEPRAZOLE 20 MG PO CPDR: 20.0000 mg | DELAYED_RELEASE_CAPSULE | Freq: Two times a day (BID) | ORAL | 1 refills | Status: DC

## 2017-08-03 NOTE — Progress Notes (Signed)
  Subjective:   Patient ID: Diane [name], female    DOB: [birth year], 61 y.o.   MRN: [id number] CC: Medication Refill  HPI: Diane [name] is a 61 y.o. female presenting for Medication Refill  Insomnia: takes ambien nightly to help with sleep Has tried other things in past, Lorrin Mais is the only thing that has worked [employer] up fine when takes it, can't fall asleep without it Has bene on for years, pt wants to [employer] stressed Not interested in medication at this time  Smoking regularly, not interested in cessation now  GERD: symptoms improve with PPI  Relevant past medical, surgical, family and social history reviewed. Allergies and medications reviewed and updated. History  Smoking Status  . Current Every Day Smoker  . Packs/day: 0.50  . Years: 30.00  . Types: Cigarettes  . Last attempt to quit: 08/03/2014  Smokeless Tobacco  . Never Used    Comment: 5-10 cigarettes a day (plans to quit prior to this surgery 05/06/15)   ROS: Per HPI   Objective:    BP 137/87   Pulse 74   Temp 97.6 F (36.4 C) (Oral)   Ht 5\' 3"  (1.6 m)   Wt 151 lb 9.6 oz (68.8 kg)   BMI 26.85 kg/m   Wt Readings from Last 3 Encounters:  08/03/17 151 lb 9.6 oz (68.8 kg)  02/23/17 150 lb (68 kg)  02/14/17 150 lb (68 kg)    Gen: NAD, alert, cooperative with exam, NCAT EYES: EOMI, no conjunctival injection, or no icterus ENT:  OP without erythema CV: NRRR, normal S1/S2, no murmur, distal pulses 2+ b/l Resp: CTABL, no wheezes, normal WOB Ext: No edema, warm Neuro: Alert and oriented, strength equal b/l UE and LE, coordination grossly normal MSK: normal muscle bulk Psych: normal affect, mood is ok  Assessment & Plan:  [name] was seen today for medication refill.  Diagnoses and all orders for this visit:  Gastroesophageal reflux disease, esophagitis presence not specified -     omeprazole (PRILOSEC) 20 MG capsule; Take 1 capsule (20 mg total) by mouth 2 (two) times daily before  a meal.  Insomnia, unspecified type #30 tabs with 5 refills given Improves sleep, remains very stressed with work No side effects Will cont to assess need, decrease use if able Must be seen for refills -     zolpidem (AMBIEN) 10 MG tablet; Take 1 tablet (10 mg total) by mouth at bedtime as needed for sleep.   Follow up plan: 6 mo Assunta Found, MD [place]

## 2017-09-18 ENCOUNTER — Other Ambulatory Visit: Payer: Self-pay | Admitting: Emergency Medicine

## 2017-09-18 MED ORDER — DICYCLOMINE HCL 10 MG PO CAPS: 10.0000 mg | ORAL_CAPSULE | Freq: Two times a day (BID) | ORAL | 2 refills | Status: DC

## 2017-09-19 ENCOUNTER — Other Ambulatory Visit: Payer: Self-pay | Admitting: *Deleted

## 2017-09-19 DIAGNOSIS — K219 Gastro-esophageal reflux disease without esophagitis: Secondary | ICD-10-CM

## 2017-09-19 MED ORDER — OMEPRAZOLE 20 MG PO CPDR: 20.0000 mg | DELAYED_RELEASE_CAPSULE | Freq: Two times a day (BID) | ORAL | 0 refills | Status: DC

## 2017-11-30 ENCOUNTER — Encounter: Payer: Self-pay | Admitting: Family Medicine

## 2017-11-30 ENCOUNTER — Ambulatory Visit (INDEPENDENT_AMBULATORY_CARE_PROVIDER_SITE_OTHER): Payer: 59 | Admitting: Family Medicine

## 2017-11-30 VITALS — BP 141/79 | HR 73 | Temp 98.5°F | Ht 63.0 in | Wt 149.6 lb

## 2017-11-30 DIAGNOSIS — R52 Pain, unspecified: Secondary | ICD-10-CM

## 2017-11-30 DIAGNOSIS — J020 Streptococcal pharyngitis: Secondary | ICD-10-CM | POA: Diagnosis not present

## 2017-11-30 LAB — RAPID STREP SCREEN (MED CTR MEBANE ONLY): Strep Gp A Ag, IA W/Reflex: POSITIVE — AB

## 2017-11-30 LAB — VERITOR FLU A/B WAIVED
Influenza A: NEGATIVE
Influenza B: NEGATIVE

## 2017-11-30 MED ORDER — AZITHROMYCIN 250 MG PO TABS: ORAL_TABLET | ORAL | 0 refills | Status: DC

## 2017-11-30 NOTE — Progress Notes (Signed)
BP (!) 141/79 (BP Location: Left Arm, Patient Position: Sitting, Cuff Size: Normal)   Pulse 73   Temp 98.5 F (36.9 C) (Oral)   Ht 5\' 3"  (1.6 m)   Wt 149 lb 9.6 oz (67.9 kg)   BMI 26.50 kg/m    Subjective:    Patient ID: Diane [name], female    DOB: 10-15-1956, 62 y.o.   MRN: [id number]  HPI: Diane [name] is a 62 y.o. female presenting on 11/30/2017 for URI (cough, sore throat, body aches started yesterday)   HPI Presents with cough, sore throat, body aches, and runny nose that began yesterday morning. Worsening symptoms today, which is why she decided to come in. Also has some feelings of bilateral ear pain. Pt reported being around grandchildren sick with strep throat, but knows of no other sick contacts. Also discussed pt has a 50+ pk yr history of smoking. Has quit several times and re-started. Has tried Chantix and Wellbutrin as well as nicotine replacements. Is considering trying to quit, but not ready yet. Feels the cough may be associated with this, as she has a chronic cough. Also has sinus pressure/upper tooth pain that is moderate in severity. Has taken OTC cold and flu acetaminophen/guafinacen and cetirizine with no symptom relief. No wheezing.  Relevant past medical, surgical, family and social history reviewed and updated as indicated. Interim medical history since our last visit reviewed. Allergies and medications reviewed and updated.  Review of Systems  Constitutional: Positive for chills and fever.  HENT: Positive for congestion, ear pain, rhinorrhea, sinus pressure and sore throat.   Respiratory: Positive for cough. Negative for wheezing.     Per HPI unless specifically indicated above   Allergies as of 11/30/2017      Reactions   Penicillins    Unknown type of reaction   Keppra [levetiracetam]    irritability      Medication List        Accurate as of 11/30/17  6:35 PM. Always use your most recent med list.          azithromycin 250  MG tablet Commonly known as:  ZITHROMAX Take 2 the first day and then one each day after.   cetirizine 10 MG tablet Commonly known as:  ZYRTEC Take 10 mg by mouth at bedtime.   dicyclomine 10 MG capsule Commonly known as:  BENTYL Take 1 capsule (10 mg total) by mouth 2 (two) times daily.   fluticasone 50 MCG/ACT nasal spray Commonly known as:  FLONASE Place 2 sprays into both nostrils daily.   hydrocortisone 25 MG suppository Commonly known as:  ANUSOL-HC Place 1 suppository (25 mg total) rectally 2 (two) times daily.   omeprazole 20 MG capsule Commonly known as:  PRILOSEC Take 1 capsule (20 mg total) by mouth 2 (two) times daily before a meal.   simvastatin 20 MG tablet Commonly known as:  ZOCOR Take 1 tablet (20 mg total) by mouth at bedtime.   zolpidem 10 MG tablet Commonly known as:  AMBIEN Take 1 tablet (10 mg total) by mouth at bedtime as needed for sleep.          Objective:    BP (!) 141/79 (BP Location: Left Arm, Patient Position: Sitting, Cuff Size: Normal)   Pulse 73   Temp 98.5 F (36.9 C) (Oral)   Ht 5\' 3"  (1.6 m)   Wt 149 lb 9.6 oz (67.9 kg)   BMI 26.50 kg/m   Wt Readings from Last 3 Encounters:  11/30/17 149 lb 9.6 oz (67.9 kg)  08/03/17 151 lb 9.6 oz (68.8 kg)  02/23/17 150 lb (68 kg)    Physical Exam  Constitutional: She is oriented to person, place, and time. She appears well-developed and well-nourished.  HENT:  Right Ear: Tympanic membrane and ear canal normal.  Left Ear: Tympanic membrane and ear canal normal.  Nose: Right sinus exhibits maxillary sinus tenderness and frontal sinus tenderness. Left sinus exhibits maxillary sinus tenderness and frontal sinus tenderness.  Mouth/Throat: Posterior oropharyngeal erythema present.  Petechiae noted.  Cardiovascular: Normal rate, regular rhythm and normal heart sounds.  Pulmonary/Chest: Effort normal and breath sounds normal. She has no wheezes. She has no rhonchi. She has no rales.    Neurological: She is alert and oriented to person, place, and time.        Assessment & Plan:   Problem List Items Addressed This Visit    None    Visit Diagnoses    Strep pharyngitis    -  Primary   Relevant Medications   azithromycin (ZITHROMAX) 250 MG tablet   Other Relevant Orders   Rapid Strep Screen (Not at [facility]) (Completed)   Generalized body aches       Relevant Orders   Veritor Flu A/B Waived (Completed)       Follow up plan: Return if symptoms worsen or fail to improve.  Counseling provided for all of the vaccine components Orders Placed This Encounter  Procedures  . Rapid Strep Screen (Not at [facility])  . Veritor Flu A/B Waived    Patient seen and examined with Chaney Malling PA student, agree with assessment and plan above. Caryl Pina, MD [facility] 12/01/2017, 10:45 AM

## 2017-12-07 ENCOUNTER — Ambulatory Visit (INDEPENDENT_AMBULATORY_CARE_PROVIDER_SITE_OTHER): Payer: 59 | Admitting: Physician Assistant

## 2017-12-07 ENCOUNTER — Telehealth: Payer: Self-pay | Admitting: Pediatrics

## 2017-12-07 VITALS — BP 151/79 | HR 72 | Ht 63.0 in | Wt 147.0 lb

## 2017-12-07 DIAGNOSIS — J0110 Acute frontal sinusitis, unspecified: Secondary | ICD-10-CM

## 2017-12-07 MED ORDER — CLINDAMYCIN HCL 150 MG PO CAPS: 150.0000 mg | ORAL_CAPSULE | Freq: Three times a day (TID) | ORAL | 0 refills | Status: DC

## 2017-12-07 MED ORDER — PREDNISONE 10 MG (21) PO TBPK: ORAL_TABLET | ORAL | 0 refills | Status: DC

## 2017-12-07 NOTE — Telephone Encounter (Signed)
Apt made 2/22

## 2017-12-07 NOTE — Telephone Encounter (Signed)
What symptoms do you have? Sore throat, right ear is pounding, fatigue  How long have you been sick? 8 days  Have you been seen for this problem? Yes, last Friday, was prescribed zpak finished a few days ago feels like symptoms are worse  If your provider decides to give you a prescription, which pharmacy would you like for it to be sent to?  [facility]   Patient informed that this information will be sent to the clinical staff for review and that they should receive a follow up call.

## 2017-12-07 NOTE — Telephone Encounter (Signed)
If she is feeling worse, I would recommend she be seen again.  She should not be feeling worse at this point.

## 2017-12-07 NOTE — Telephone Encounter (Signed)
Covering PCP- please advise and send back to pools.

## 2017-12-10 ENCOUNTER — Encounter: Payer: Self-pay | Admitting: Physician Assistant

## 2017-12-10 NOTE — Progress Notes (Signed)
BP (!) 151/79 (BP Location: Left Arm, Patient Position: Sitting, Cuff Size: Normal)   Pulse 72   Ht 5\' 3"  (1.6 m)   Wt 147 lb (66.7 kg)   BMI 26.04 kg/m    Subjective:    Patient ID: Diane [name], female    DOB: [birth year], 62 y.o.   MRN: [id number]  HPI: Diane [name] is a 62 y.o. female presenting on 12/07/2017 for Otalgia (right ear pain. "Feels like a hot poker") and Sore Throat  This patient has had many days of sinus headache and postnasal drainage. There is copious drainage at times. Denies any fever at this time. There has been a history of sinus infections in the past.  No history of sinus surgery. There is cough at night. It has become more prevalent in recent days.   Past Medical History:  Diagnosis Date  . Anxiety   . Arthritis    fingers, left foot  . C. difficile diarrhea 2015  . COPD (chronic obstructive pulmonary disease) ([place])   . Depression   . H pylori ulcer   . Headache(784.0)   . Hypercholesterolemia   . Kidney stones    20 years ago  . Pneumonia    5 years ago  . Seizures ([place])    2 years ago, "cluster of seizures" none since  . Shingles    Relevant past medical, surgical, family and social history reviewed and updated as indicated. Interim medical history since our last visit reviewed. Allergies and medications reviewed and updated. DATA REVIEWED: CHART IN EPIC  Family History reviewed for pertinent findings.  Review of Systems  Constitutional: Positive for chills and fatigue. Negative for activity change and appetite change.  HENT: Positive for congestion, postnasal drip and sore throat.   Eyes: Negative.   Respiratory: Positive for cough and wheezing.   Cardiovascular: Negative.  Negative for chest pain, palpitations and leg swelling.  Gastrointestinal: Negative.   Genitourinary: Negative.   Musculoskeletal: Negative.   Skin: Negative.   Neurological: Positive for headaches.    Allergies as of 12/07/2017      Reactions   Penicillins    Unknown type of reaction   Keppra [levetiracetam]    irritability      Medication List        Accurate as of 12/07/17 11:59 PM. Always use your most recent med list.          azithromycin 250 MG tablet Commonly known as:  ZITHROMAX Take 2 the first day and then one each day after.   cetirizine 10 MG tablet Commonly known as:  ZYRTEC Take 10 mg by mouth at bedtime.   clindamycin 150 MG capsule Commonly known as:  CLEOCIN Take 1 capsule (150 mg total) by mouth 3 (three) times daily.   dicyclomine 10 MG capsule Commonly known as:  BENTYL Take 1 capsule (10 mg total) by mouth 2 (two) times daily.   fluticasone 50 MCG/ACT nasal spray Commonly known as:  FLONASE Place 2 sprays into both nostrils daily.   hydrocortisone 25 MG suppository Commonly known as:  ANUSOL-HC Place 1 suppository (25 mg total) rectally 2 (two) times daily.   omeprazole 20 MG capsule Commonly known as:  PRILOSEC Take 1 capsule (20 mg total) by mouth 2 (two) times daily before a meal.   predniSONE 10 MG (21) Tbpk tablet Commonly known as:  STERAPRED UNI-PAK 21 TAB As directed x 6 days   simvastatin 20 MG tablet Commonly known as:  ZOCOR Take 1 tablet (  20 mg total) by mouth at bedtime.   zolpidem 10 MG tablet Commonly known as:  AMBIEN Take 1 tablet (10 mg total) by mouth at bedtime as needed for sleep.          Objective:    BP (!) 151/79 (BP Location: Left Arm, Patient Position: Sitting, Cuff Size: Normal)   Pulse 72   Ht 5\' 3"  (1.6 m)   Wt 147 lb (66.7 kg)   BMI 26.04 kg/m   Allergies  Allergen Reactions  . Penicillins     Unknown type of reaction  . Keppra [Levetiracetam]     irritability    Wt Readings from Last 3 Encounters:  12/07/17 147 lb (66.7 kg)  11/30/17 149 lb 9.6 oz (67.9 kg)  08/03/17 151 lb 9.6 oz (68.8 kg)    Physical Exam  Constitutional: She is oriented to person, place, and time. She appears well-developed and well-nourished.  HENT:    Head: Normocephalic and atraumatic.  Right Ear: Tympanic membrane and external ear normal. No middle ear effusion.  Left Ear: Tympanic membrane and external ear normal.  No middle ear effusion.  Nose: Mucosal edema and rhinorrhea present. Right sinus exhibits no maxillary sinus tenderness. Left sinus exhibits no maxillary sinus tenderness.  Mouth/Throat: Uvula is midline. Posterior oropharyngeal erythema present.  Eyes: Conjunctivae and EOM are normal. Pupils are equal, round, and reactive to light. Right eye exhibits no discharge. Left eye exhibits no discharge.  Neck: Normal range of motion.  Cardiovascular: Normal rate, regular rhythm and normal heart sounds.  Pulmonary/Chest: Effort normal and breath sounds normal. No respiratory distress. She has no wheezes.  Abdominal: Soft.  Lymphadenopathy:    She has no cervical adenopathy.  Neurological: She is alert and oriented to person, place, and time.  Skin: Skin is warm and dry.  Psychiatric: She has a normal mood and affect.  Nursing note and vitals reviewed.   Results for orders placed or performed in visit on 11/30/17  Rapid Strep Screen (Not at [facility])  Result Value Ref Range   Strep Gp A Ag, IA W/Reflex Positive (A) Negative  Veritor Flu A/B Waived  Result Value Ref Range   Influenza A Negative Negative   Influenza B Negative Negative      Assessment & Plan:   1. Acute non-recurrent frontal sinusitis - clindamycin (CLEOCIN) 150 MG capsule; Take 1 capsule (150 mg total) by mouth 3 (three) times daily.  Dispense: 30 capsule; Refill: 0 - predniSONE (STERAPRED UNI-PAK 21 TAB) 10 MG (21) TBPK tablet; As directed x 6 days  Dispense: 21 tablet; Refill: 0   Continue all other maintenance medications as listed above.  Follow up plan: No Follow-up on file.  Educational handout given for [place] PA-C [facility] [address]  [place], [place] [address] [phone]   12/10/2017, 10:28  PM

## 2018-01-03 ENCOUNTER — Other Ambulatory Visit: Payer: Self-pay | Admitting: Pediatrics

## 2018-01-03 DIAGNOSIS — G4700 Insomnia, unspecified: Secondary | ICD-10-CM

## 2018-01-04 NOTE — Telephone Encounter (Signed)
Needs to be seen for refills. Needs to be seen by me q6 mo for refills. Last saw me 6 mo ago.

## 2018-01-04 NOTE — Telephone Encounter (Signed)
Last seen 12/07/17

## 2018-01-04 NOTE — Telephone Encounter (Signed)
Patient has appt

## 2018-01-11 ENCOUNTER — Ambulatory Visit (INDEPENDENT_AMBULATORY_CARE_PROVIDER_SITE_OTHER): Payer: 59 | Admitting: Pediatrics

## 2018-01-11 ENCOUNTER — Encounter: Payer: Self-pay | Admitting: Pediatrics

## 2018-01-11 VITALS — BP 135/79 | HR 73 | Temp 97.2°F | Ht 63.0 in | Wt 149.8 lb

## 2018-01-11 DIAGNOSIS — E785 Hyperlipidemia, unspecified: Secondary | ICD-10-CM

## 2018-01-11 DIAGNOSIS — Z0000 Encounter for general adult medical examination without abnormal findings: Secondary | ICD-10-CM

## 2018-01-11 DIAGNOSIS — K219 Gastro-esophageal reflux disease without esophagitis: Secondary | ICD-10-CM | POA: Insufficient documentation

## 2018-01-11 DIAGNOSIS — F419 Anxiety disorder, unspecified: Secondary | ICD-10-CM

## 2018-01-11 DIAGNOSIS — G4700 Insomnia, unspecified: Secondary | ICD-10-CM | POA: Diagnosis not present

## 2018-01-11 LAB — LIPID PANEL
CHOL/HDL RATIO: 2.9 ratio (ref 0.0–4.4)
Cholesterol, Total: 221 mg/dL — ABNORMAL HIGH (ref 100–199)
HDL: 76 mg/dL (ref >39)
LDL Calculated: 125 mg/dL — ABNORMAL HIGH (ref 0–99)
TRIGLYCERIDES: 100 mg/dL (ref 0–149)
VLDL Cholesterol Cal: 20 mg/dL (ref 5–40)

## 2018-01-11 LAB — BMP8+EGFR
BUN/Creatinine Ratio: 25 (ref 12–28)
BUN: 16 mg/dL (ref 8–27)
CALCIUM: 9.2 mg/dL (ref 8.7–10.3)
CHLORIDE: 102 mmol/L (ref 96–106)
CO2: 23 mmol/L (ref 20–29)
Creatinine, Ser: 0.63 mg/dL (ref 0.57–1.00)
GFR calc non Af Amer: 98 mL/min/{1.73_m2} (ref >59)
GFR, EST AFRICAN AMERICAN: 113 mL/min/{1.73_m2} (ref >59)
Glucose: 86 mg/dL (ref 65–99)
Potassium: 4.3 mmol/L (ref 3.5–5.2)
Sodium: 140 mmol/L (ref 134–144)

## 2018-01-11 LAB — BAYER DCA HB A1C WAIVED: HB A1C: 5.1 % (ref <7.0)

## 2018-01-11 MED ORDER — ESCITALOPRAM OXALATE 20 MG PO TABS: 20.0000 mg | ORAL_TABLET | Freq: Every day | ORAL | 3 refills | Status: DC

## 2018-01-11 MED ORDER — SIMVASTATIN 20 MG PO TABS: 20.0000 mg | ORAL_TABLET | Freq: Every day | ORAL | 1 refills | Status: DC

## 2018-01-11 MED ORDER — OMEPRAZOLE 20 MG PO CPDR: 20.0000 mg | DELAYED_RELEASE_CAPSULE | Freq: Two times a day (BID) | ORAL | 0 refills | Status: DC

## 2018-01-11 MED ORDER — ZOLPIDEM TARTRATE 10 MG PO TABS: 10.0000 mg | ORAL_TABLET | Freq: Every evening | ORAL | 5 refills | Status: DC | PRN

## 2018-01-11 MED ORDER — ESCITALOPRAM OXALATE 10 MG PO TABS: 10.0000 mg | ORAL_TABLET | Freq: Every day | ORAL | 3 refills | Status: DC

## 2018-01-11 NOTE — Progress Notes (Signed)
Subjective:   Patient ID: Diane [name], female    DOB: 07/10/1956, 62 y.o.   MRN: [id number] CC: Medication Refill  HPI: Diane [name] is a 62 y.o. female presenting for Medication Refill  Insomnia: Taking Ambien every night.  Continues to feel very anxious.  Alcohol use: Drinks 1-2 drinks most nights a week, takes 1-2 nights off each week from drinking.  Tobacco use: Stopped smoking 4 weeks ago.  Has been coughing more since then.  No fevers.  Appetite is been fine.  Anxiety: Feels like she is worrying excessively all the time. "Feeling like the other she was going to drop at any point"  No thoughts of self-harm.  She feels like her mood is been fine.  She has had some depression in the past or at least been diagnosed with it.  She is wondering if it has mostly been anxiety.  GAD 7 : Generalized Anxiety Score 01/11/2018  Nervous, Anxious, on Edge 3  Control/stop worrying 1  Worry too much - different things 1  Trouble relaxing 3  Restless 3  Easily annoyed or irritable 3  Afraid - awful might happen 0  Total GAD 7 Score 14  Anxiety Difficulty Somewhat difficult    Depression screen [facility] 2/9 01/11/2018 08/03/2017 02/08/2017 01/10/2017 12/18/2016  Decreased Interest 0 1 0 0 0  Down, Depressed, Hopeless 0 1 0 0 0  PHQ - 2 Score 0 2 0 0 0  Altered sleeping - 1 - - -  Tired, decreased energy - 1 - - -  Change in appetite - 1 - - -  Feeling bad or failure about yourself  - 0 - - -  Trouble concentrating - 0 - - -  Moving slowly or fidgety/restless - 0 - - -  Suicidal thoughts - 0 - - -  PHQ-9 Score - 5 - - -  Difficult doing work/chores - Somewhat difficult - - -      Relevant past medical, surgical, family and social history reviewed. Allergies and medications reviewed and updated. ROS: Per HPI   Objective:    BP 135/79   Pulse 73   Temp (!) 97.2 F (36.2 C) (Oral)   Ht 5' 3"  (1.6 m)   Wt 149 lb 12.8 oz (67.9 kg)   BMI 26.54 kg/m   Wt Readings from Last  3 Encounters:  01/11/18 149 lb 12.8 oz (67.9 kg)  12/07/17 147 lb (66.7 kg)  11/30/17 149 lb 9.6 oz (67.9 kg)    Gen: NAD, alert, cooperative with exam, NCAT EYES: EOMI, no conjunctival injection, or no icterus CV: NRRR, normal S1/S2, no murmur, distal pulses 2+ b/l Resp: CTABL, no wheezes, normal WOB Abd: +BS, soft, NTND. no guarding or organomegaly Ext: No edema, warm Neuro: Alert and oriented, strength equal b/l UE and LE, coordination grossly normal MSK: normal muscle bulk psych: Normal affect.  No thoughts of self-harm.  Assessment & Plan:  Diane [name] was seen today for medication refill.  Diagnoses and all orders for this visit:  Gastroesophageal reflux disease, esophagitis presence not specified Stable.  Continue below. -     omeprazole (PRILOSEC) 20 MG capsule; Take 1 capsule (20 mg total) by mouth 2 (two) times daily before a meal.  Insomnia, unspecified type Ongoing sleep trouble.  Discussed decreasing alcohol intake.  Will try treating anxiety, may help with symptoms. -     zolpidem (AMBIEN) 10 MG tablet; Take 1 tablet (10 mg total) by mouth at bedtime as needed for sleep.  Encounter for preventive care Due for work labs. -     Lipid panel -     BMP8+EGFR -     Bayer DCA Hb A1c Waived  Hyperlipidemia, unspecified hyperlipidemia type -     simvastatin (ZOCOR) 20 MG tablet; Take 1 tablet (20 mg total) by mouth at bedtime.  Anxiety Positive gad 7.  We will start treatment with below.  Take half tab for a week, then increase to full tab.  Open to virtual behavioral health follow-up, may be open to counseling for coping mechanisms in future. -     escitalopram (LEXAPRO) 10 mg tablet   Follow up plan: Return in about 3 months (around 04/13/2018). Assunta Found, MD [facility]

## 2018-02-11 ENCOUNTER — Telehealth: Payer: Self-pay

## 2018-02-11 NOTE — Telephone Encounter (Signed)
[facility] left a voice mail message   

## 2018-03-15 ENCOUNTER — Encounter: Payer: Self-pay | Admitting: Pediatrics

## 2018-03-15 ENCOUNTER — Ambulatory Visit (INDEPENDENT_AMBULATORY_CARE_PROVIDER_SITE_OTHER): Payer: 59 | Admitting: Pediatrics

## 2018-03-15 VITALS — BP 134/77 | HR 63 | Temp 97.8°F | Ht 63.0 in | Wt 150.2 lb

## 2018-03-15 DIAGNOSIS — F419 Anxiety disorder, unspecified: Secondary | ICD-10-CM | POA: Diagnosis not present

## 2018-03-15 DIAGNOSIS — J029 Acute pharyngitis, unspecified: Secondary | ICD-10-CM

## 2018-03-15 DIAGNOSIS — J069 Acute upper respiratory infection, unspecified: Secondary | ICD-10-CM | POA: Diagnosis not present

## 2018-03-15 LAB — CULTURE, GROUP A STREP

## 2018-03-15 LAB — RAPID STREP SCREEN (MED CTR MEBANE ONLY): STREP GP A AG, IA W/REFLEX: NEGATIVE

## 2018-03-15 MED ORDER — ESCITALOPRAM OXALATE 10 MG PO TABS: 10.0000 mg | ORAL_TABLET | Freq: Every day | ORAL | 3 refills | Status: DC

## 2018-03-15 NOTE — Progress Notes (Signed)
  Subjective:   Patient ID: Diane [name], female    DOB: 09/13/1956, 62 y.o.   MRN: [id number] CC: Sore Throat and Fatigue  HPI: Diane [name] is a 62 y.o. female   Sore throat for about the last 3 days.  Feels okay in the morning, but the day with talking gets worse.  Has to be talking throughout the day for work.  No fevers.  Appetite is down.  Has been around school-age grandkids who are often sick.minimal coughing.  Complains feels slightly sore in her neck.  Some nasal congestion.    Anxiety: Taking Lexapro, symptoms much improved.  Pleased with how she is been feeling.  Relevant past medical, surgical, family and social history reviewed. Allergies and medications reviewed and updated. Social History   Tobacco Use  Smoking Status Former Smoker  . Packs/day: 0.50  . Years: 30.00  . Pack years: 15.00  . Types: Cigarettes  . Last attempt to quit: 12/14/2017  . Years since quitting: 0.2  Smokeless Tobacco Never Used  Tobacco Comment   5-10 cigarettes a day (plans to quit prior to this surgery 05/06/15)   ROS: Per HPI   Objective:    BP 134/77   Pulse 63   Temp 97.8 F (36.6 C) (Oral)   Ht 5\' 3"  (1.6 m)   Wt 150 lb 3.2 oz (68.1 kg)   BMI 26.61 kg/m   Wt Readings from Last 3 Encounters:  03/15/18 150 lb 3.2 oz (68.1 kg)  01/11/18 149 lb 12.8 oz (67.9 kg)  12/07/17 147 lb (66.7 kg)    Gen: NAD, alert, cooperative with exam, NCAT EYES: EOMI, no conjunctival injection, or no icterus ENT:  TMs pearly gray b/l, OP with mild erythema, no visible tonsils LYMPH: <1cm anterior cervical LAD bilaterally CV: NRRR, normal S1/S2, no murmur, distal pulses 2+ b/l Resp: CTABL, no wheezes, normal WOB Ext: No edema, warm Neuro: Alert and oriented, strength equal b/l UE and LE, coordination grossly normal MSK: normal muscle bulk Psych: Normal affect  Assessment & Plan:  Diane [name] was seen today for sore throat and fatigue.  Diagnoses and all orders for this  visit:  Anxiety Symptoms improved, continue below. -     escitalopram (LEXAPRO) 10 MG tablet; Take 1 tablet (10 mg total) by mouth daily.  Sore throat Rapid test negative -     Rapid Strep Screen ([facility] ONLY) -     Culture, Group A Strep  Acute URI Symptomatic care return precautions discussed.  Follow up plan: Return in about 5 months (around 08/15/2018). Diane Found, MD [place]

## 2018-03-18 LAB — CULTURE, GROUP A STREP: Strep A Culture: NEGATIVE

## 2018-04-12 ENCOUNTER — Telehealth: Payer: Self-pay

## 2018-04-12 DIAGNOSIS — F419 Anxiety disorder, unspecified: Secondary | ICD-10-CM

## 2018-04-12 NOTE — BH Specialist Note (Signed)
[facility] Initial Clinical Assessment  MRN: [id number] NAME: Diane [name] Date: 04/12/18   Total time: 1 hour  Type of Contact: Type of Contact: Phone Call Initial Contact Patient consent obtained: Patient consent obtained for Virtual Visit: No Reason for Visit today: Reason for Your Call/Visit Today: [facility] Assessment   Treatment History Patient recently received Inpatient Treatment: Have You Recently Been in Any Inpatient Treatment (Hospital/Detox/Crisis Center/28-Day Program)?: No  Facility/Program:  NA  Date of discharge:  N/A Patient currently being seen by therapist/psychiatrist: Do You Currently Have a Therapist/Psychiatrist?: No   Patient currently receiving the following services: Patient Currently Receiving the Following Services:: Medication Management(    Medication management with PCP)    Past Psychiatric History/Hospitalization(s): Anxiety: No Bipolar Disorder: No Depression: No Mania: No Psychosis: No Schizophrenia: No Personality Disorder: No Hospitalization for psychiatric illness: No History of Electroconvulsive Shock Therapy: No Prior Suicide Attempts: No Decreased need for sleep: No  Euphoria: No Self Injurious behaviors No Family History of mental illness: No Family History of substance abuse: No  Substance Abuse: No  DUI: No  Insomnia: No  History of violence No  Physical, sexual or emotional abuse:No  Prior outpatient mental health therapy: No      Clinical Assessment:  PHQ-9 Assessments: Depression screen [facility] 2/9 04/12/2018 03/15/2018 01/11/2018  Decreased Interest 0 0 0  Down, Depressed, Hopeless 0 0 0  PHQ - 2 Score 0 0 0  Altered sleeping 0 - -  Tired, decreased energy 0 - -  Change in appetite 0 - -  Feeling bad or failure about yourself  0 - -  Trouble concentrating 0 - -  Moving slowly or fidgety/restless 0 - -  Suicidal thoughts 0 - -  PHQ-9 Score 0 - -  Difficult doing work/chores Not difficult at all -  -    GAD-7 Assessments: GAD 7 : Generalized Anxiety Score 04/12/2018 04/12/2018 01/11/2018  Nervous, Anxious, on Edge 0 0 3  Control/stop worrying 0 0 1  Worry too much - different things 0 0 1  Trouble relaxing 0 0 3  Restless 0 0 3  Easily annoyed or irritable 0 0 3  Afraid - awful might happen 0 0 0  Total GAD 7 Score 0 0 14  Anxiety Difficulty - Not difficult at all Somewhat difficult     Social Functioning Social maturity: Social Maturity: Responsible Social judgement: Social Judgement: Normal  Stress Current stressors: Current Stressors: (6 Adult children and 10 grandchildren ) Familial stressors: Familial Stressors: None Sleep: Sleep: No problems Appetite: Appetite: No problems Coping ability: Coping ability: Normal Patient taking medications as prescribed: Patient taking medications as prescribed: Yes  Current medications:  Outpatient Encounter Medications as of 04/12/2018  Medication Sig  . cetirizine (ZYRTEC) 10 MG tablet Take 10 mg by mouth at bedtime.   . dicyclomine (BENTYL) 10 MG capsule Take 1 capsule (10 mg total) by mouth 2 (two) times daily.  Marland Kitchen escitalopram (LEXAPRO) 10 MG tablet Take 1 tablet (10 mg total) by mouth daily.  . hydrocortisone (ANUSOL-HC) 25 MG suppository Place 1 suppository (25 mg total) rectally 2 (two) times daily.  Marland Kitchen omeprazole (PRILOSEC) 20 MG capsule Take 1 capsule (20 mg total) by mouth 2 (two) times daily before a meal.  . simvastatin (ZOCOR) 20 MG tablet Take 1 tablet (20 mg total) by mouth at bedtime.  Marland Kitchen zolpidem (AMBIEN) 10 MG tablet Take 1 tablet (10 mg total) by mouth at bedtime as needed for sleep.  No facility-administered encounter medications on file as of 20-Apr-2018.     Self-harm Behaviors Risk Assessment Self-harm risk factors: Self-harm risk factors: (N/A) Patient endorses recent thoughts of harming self: Have you recently had any thoughts about harming yourself?: No  [place] Suicide Severity Rating Scale:  C-SRSS  04-20-2018  1. Wish to be Dead No  2. Suicidal Thoughts No  6. Suicide Behavior Question No    Danger to Others Risk Assessment Danger to others risk factors: Danger to Others Risk Factors: No risk factors noted Patient endorses recent thoughts of harming others: Notification required: No need or identified person  Dynamic Appraisal of Situational Aggression (DASA): No flowsheet data found.  Substance Use Assessment Patient recently consumed alcohol: Have you recently consumed alcohol?: No  Alcohol Use Disorder Identification Test (AUDIT):  Alcohol Use Disorder Test (AUDIT) 04/20/18  1. How often do you have a drink containing alcohol? 0  2. How many drinks containing alcohol do you have on a typical day when you are drinking? 0  3. How often do you have six or more drinks on one occasion? 0  AUDIT-C Score 0  Intervention/Follow-up AUDIT Score <7 follow-up not indicated   Patient recently used drugs: Have you recently used any drugs?: No Patient is concerned about dependence or abuse of substances: Does patient seem concerned about dependence or abuse of any substance?: No   Goals, Interventions and Follow-up Plan Goals: Increase healthy adjustment to current life circumstances Interventions: Motivational Interviewing, Solution-Focused Strategies, Mindfulness or Psychologist, educational, Behavioral Activation, Brief CBT and Supportive Counseling Follow-up Plan: [facility] Phone Follow Up   Summary of Clinical Assessment Summary:  Patient is a 62 year old female that denies physical, sexual or emotional.   Patient reports that her     Rene Paci, LCAS-A

## 2018-04-16 ENCOUNTER — Ambulatory Visit (INDEPENDENT_AMBULATORY_CARE_PROVIDER_SITE_OTHER): Payer: 59 | Admitting: Family Medicine

## 2018-04-16 ENCOUNTER — Telehealth: Payer: Self-pay

## 2018-04-16 ENCOUNTER — Encounter: Payer: Self-pay | Admitting: Family Medicine

## 2018-04-16 VITALS — BP 137/71 | HR 64 | Temp 98.5°F | Ht 63.0 in | Wt 147.2 lb

## 2018-04-16 DIAGNOSIS — S80862A Insect bite (nonvenomous), left lower leg, initial encounter: Secondary | ICD-10-CM | POA: Diagnosis not present

## 2018-04-16 DIAGNOSIS — W57XXXA Bitten or stung by nonvenomous insect and other nonvenomous arthropods, initial encounter: Secondary | ICD-10-CM

## 2018-04-16 MED ORDER — DOXYCYCLINE HYCLATE 100 MG PO TABS: 100.0000 mg | ORAL_TABLET | Freq: Two times a day (BID) | ORAL | 0 refills | Status: DC

## 2018-04-16 NOTE — Patient Instructions (Signed)
Great to meet you!

## 2018-04-16 NOTE — Progress Notes (Signed)
   HPI  Patient presents today here after tick bite.  Patient states she removed a tick from her left calf about 1 week ago.  1 to 2 days ago she developed a target lesion over the area.  She denies any fever, chills, sweats.  She denies any new joint pains.  She stopped smoking 4 months ago and states she feels better than she ever has.  She does have history of C. difficile colitis and is using probiotics twice daily.  PMH: Smoking status noted ROS: Per HPI  Objective: BP 137/71   Pulse 64   Temp 98.5 F (36.9 C) (Oral)   Ht 5\' 3"  (1.6 m)   Wt 147 lb 3.2 oz (66.8 kg)   BMI 26.08 kg/m  Gen: NAD, alert, cooperative with exam HEENT: NCAT, EOMI, PERRL CV: RRR, good S1/S2, no murmur Resp: CTABL, no wheezes, non-labored Ext: No edema, warm Neuro: Alert and oriented, No gross deficits Skin:  8 cm x 5.5 cm area of erythema with 2 cm x 3 cm central clearing around a punctate lesion consistent with a tick bite.  Assessment and plan:  #Tick bite 1 week after the tick was removed, targetoid lesion Cover for Lyme disease with doxycycline Discussed labs in 4 to 6 weeks if she desires Increased risk of antibiotic use given history of C. difficile colitis, discussed probiotics    Meds ordered this encounter  Medications  . doxycycline (VIBRA-TABS) 100 MG tablet    Sig: Take 1 tablet (100 mg total) by mouth 2 (two) times daily. 1 po bid    Dispense:  28 tablet    Refill:  0    Laroy Apple, MD [facility] 04/16/2018, 10:14 AM

## 2018-04-16 NOTE — Telephone Encounter (Signed)
[facility] - Left Msg 

## 2018-05-13 ENCOUNTER — Telehealth: Payer: Self-pay

## 2018-05-13 NOTE — Telephone Encounter (Signed)
[facility] - Left Msg 

## 2018-05-28 ENCOUNTER — Telehealth: Payer: Self-pay

## 2018-05-28 NOTE — Telephone Encounter (Signed)
Patient reports that she was in a meeting and unable to talk.

## 2018-06-25 ENCOUNTER — Telehealth: Payer: Self-pay

## 2018-06-25 NOTE — Telephone Encounter (Signed)
[facility] - Several attempts have been made to contact patient without success. Patient will be placed on the inactive list.  If services are needed again.  Please contact [facility] at [phone].    Information will be routed to the PCP and Dr. Modesta Messing

## 2018-06-29 ENCOUNTER — Other Ambulatory Visit: Payer: Self-pay | Admitting: Pediatrics

## 2018-06-29 DIAGNOSIS — G4700 Insomnia, unspecified: Secondary | ICD-10-CM

## 2018-07-01 NOTE — Telephone Encounter (Signed)
Lm 9/16-jhb

## 2018-07-01 NOTE — Telephone Encounter (Signed)
Vincent. NTBS last OV 01/11/18

## 2018-07-05 ENCOUNTER — Encounter: Payer: Self-pay | Admitting: Pediatrics

## 2018-07-05 ENCOUNTER — Ambulatory Visit (INDEPENDENT_AMBULATORY_CARE_PROVIDER_SITE_OTHER): Payer: 59 | Admitting: Pediatrics

## 2018-07-05 VITALS — BP 121/78 | HR 68 | Temp 97.2°F | Ht 63.0 in | Wt 145.4 lb

## 2018-07-05 DIAGNOSIS — G4700 Insomnia, unspecified: Secondary | ICD-10-CM

## 2018-07-05 DIAGNOSIS — Z1239 Encounter for other screening for malignant neoplasm of breast: Secondary | ICD-10-CM

## 2018-07-05 DIAGNOSIS — E785 Hyperlipidemia, unspecified: Secondary | ICD-10-CM

## 2018-07-05 DIAGNOSIS — F419 Anxiety disorder, unspecified: Secondary | ICD-10-CM

## 2018-07-05 DIAGNOSIS — K219 Gastro-esophageal reflux disease without esophagitis: Secondary | ICD-10-CM

## 2018-07-05 DIAGNOSIS — Z1231 Encounter for screening mammogram for malignant neoplasm of breast: Secondary | ICD-10-CM

## 2018-07-05 MED ORDER — FLUTICASONE PROPIONATE 50 MCG/ACT NA SUSP: 2.0000 | Freq: Every day | NASAL | 6 refills | Status: DC

## 2018-07-05 MED ORDER — OMEPRAZOLE 20 MG PO CPDR: 20.0000 mg | DELAYED_RELEASE_CAPSULE | Freq: Two times a day (BID) | ORAL | 1 refills | Status: DC

## 2018-07-05 MED ORDER — SIMVASTATIN 20 MG PO TABS: 20.0000 mg | ORAL_TABLET | Freq: Every day | ORAL | 3 refills | Status: DC

## 2018-07-05 MED ORDER — ZOLPIDEM TARTRATE 10 MG PO TABS: 10.0000 mg | ORAL_TABLET | Freq: Every evening | ORAL | 5 refills | Status: DC | PRN

## 2018-07-05 MED ORDER — ESCITALOPRAM OXALATE 10 MG PO TABS: 10.0000 mg | ORAL_TABLET | Freq: Every day | ORAL | 3 refills | Status: DC

## 2018-07-05 NOTE — Progress Notes (Signed)
  Subjective:   Patient ID: Diane [name], female    DOB: [birth year], 62 y.o.   MRN: [id number] CC: Medical Management of Chronic Issues  HPI: Diane [name] is a 62 y.o. female   Insomnia: Taking Ambien.  Has been on medicine for several years.  Gets good rest when on it, no side effects.  Not been able to sleep for the last few days since she has run out.  Anxiety: Symptoms improved on Lexapro 10 mg.  Ongoing changes at home with family causing some more stress, patient thinks anxiety has been going fairly well though.  Hyperlipidemia: Tolerating medicine, no side effects  GERD: Resolved most days, minimal breakthrough symptoms.  Relevant past medical, surgical, family and social history reviewed. Allergies and medications reviewed and updated. Social History   Tobacco Use  Smoking Status Former Smoker  . Packs/day: 0.50  . Years: 30.00  . Pack years: 15.00  . Types: Cigarettes  . Last attempt to quit: 12/14/2017  . Years since quitting: 0.5  Smokeless Tobacco Never Used  Tobacco Comment   5-10 cigarettes a day (plans to quit prior to this surgery 05/06/15)   ROS: Per HPI   Objective:    BP 121/78   Pulse 68   Temp (!) 97.2 F (36.2 C) (Oral)   Ht 5\' 3"  (1.6 m)   Wt 145 lb 6.4 oz (66 kg)   BMI 25.76 kg/m   Wt Readings from Last 3 Encounters:  07/05/18 145 lb 6.4 oz (66 kg)  04/16/18 147 lb 3.2 oz (66.8 kg)  03/15/18 150 lb 3.2 oz (68.1 kg)    Gen: NAD, alert, cooperative with exam, NCAT EYES: EOMI, no conjunctival injection, or no icterus ENT:  TMs pearly gray b/l, OP without erythema LYMPH: no cervical LAD CV: NRRR, normal S1/S2, no murmur, distal pulses 2+ b/l Resp: CTABL, no wheezes, normal WOB Abd: +BS, soft, NTND. no guarding or organomegaly Ext: No edema, warm Neuro: Alert and oriented, strength equal b/l UE and LE, coordination grossly normal MSK: normal muscle bulk  Assessment & Plan:  Diane [name] was seen today for medical management of  chronic issues.  Diagnoses and all orders for this visit:  Insomnia, unspecified type Stable on below, continue. -     zolpidem (AMBIEN) 10 MG tablet; Take 1 tablet (10 mg total) by mouth at bedtime as needed for sleep.  Hyperlipidemia, unspecified hyperlipidemia type Stable, continue below -     simvastatin (ZOCOR) 20 MG tablet; Take 1 tablet (20 mg total) by mouth at bedtime.  Gastroesophageal reflux disease, esophagitis presence not specified Stable, continue below -     omeprazole (PRILOSEC) 20 MG capsule; Take 1 capsule (20 mg total) by mouth 2 (two) times daily before a meal.  Anxiety Stable, continue below.  Discussed options including increasing future if needed.  Patient will let me know. -     escitalopram (LEXAPRO) 10 MG tablet; Take 1 tablet (10 mg total) by mouth daily.  Screening for breast cancer -     MM Digital Screening; Future  Other orders Uses below for allergy season. -     fluticasone (FLONASE) 50 MCG/ACT nasal spray; Place 2 sprays into both nostrils daily.   Follow up plan: Return in about 6 months (around 01/03/2019). Assunta Found, MD [place]

## 2018-07-15 ENCOUNTER — Encounter: Payer: Self-pay | Admitting: Pediatrics

## 2018-07-15 DIAGNOSIS — F419 Anxiety disorder, unspecified: Secondary | ICD-10-CM

## 2018-07-15 MED ORDER — ESCITALOPRAM OXALATE 10 MG PO TABS: 15.0000 mg | ORAL_TABLET | Freq: Every day | ORAL | 3 refills | Status: DC

## 2018-09-11 ENCOUNTER — Encounter: Payer: Self-pay | Admitting: Pediatrics

## 2018-09-11 ENCOUNTER — Ambulatory Visit (INDEPENDENT_AMBULATORY_CARE_PROVIDER_SITE_OTHER): Payer: 59 | Admitting: Pediatrics

## 2018-09-11 VITALS — BP 131/78 | HR 69 | Temp 97.0°F | Ht 63.0 in | Wt 146.8 lb

## 2018-09-11 DIAGNOSIS — H6501 Acute serous otitis media, right ear: Secondary | ICD-10-CM

## 2018-09-11 MED ORDER — AZITHROMYCIN 250 MG PO TABS: ORAL_TABLET | ORAL | 0 refills | Status: DC

## 2018-09-11 NOTE — Patient Instructions (Signed)
Use flonase two sprays each side daily  Use daily antihistamine  Saline nose spray or rinses with neti pot or similar for nasal congestion as needed

## 2018-09-11 NOTE — Progress Notes (Signed)
  Subjective:   Patient ID: Diane [name], female    DOB: 1956-03-03, 62 y.o.   MRN: [id number] CC: Ear Pain (Right)  HPI: Diane [name] is a 62 y.o. female   Some runny nose and congestion for about the last week.  Yesterday evening had severe pain in her right ear.  She says her husband said that she would just kept yelling that there is fire in her ear.  The pain eased some over the next few hours.  Still does not feel normal now.  Relevant past medical, surgical, family and social history reviewed. Allergies and medications reviewed and updated. Social History   Tobacco Use  Smoking Status Former Smoker  . Packs/day: 0.50  . Years: 30.00  . Pack years: 15.00  . Types: Cigarettes  . Last attempt to quit: 12/14/2017  . Years since quitting: 0.7  Smokeless Tobacco Never Used  Tobacco Comment   5-10 cigarettes a day (plans to quit prior to this surgery 05/06/15)   ROS: Per HPI   Objective:    BP 131/78   Pulse 69   Temp (!) 97 F (36.1 C) (Oral)   Ht 5\' 3"  (1.6 m)   Wt 146 lb 12.8 oz (66.6 kg)   BMI 26.00 kg/m   Wt Readings from Last 3 Encounters:  09/11/18 146 lb 12.8 oz (66.6 kg)  07/05/18 145 lb 6.4 oz (66 kg)  04/16/18 147 lb 3.2 oz (66.8 kg)    Gen: NAD, alert, cooperative with exam, NCAT EYES: EOMI, no conjunctival injection, or no icterus ENT: Right TM injected, dull.  Left TM injected. OP without erythema LYMPH: no cervical LAD CV: NRRR, normal S1/S2, no murmur, distal pulses 2+ b/l Resp: CTABL, no wheezes, normal WOB Ext: No edema, warm Neuro: Alert and oriented  Assessment & Plan:  [name] was seen today for ear pain.  Diagnoses and all orders for this visit:  Non-recurrent acute serous otitis media of right ear Given symptoms and exam, will treat with below.  Restart Flonase daily, antihistamine daily. -     azithromycin (ZITHROMAX) 250 MG tablet; Take 2 the first day and then one each day after.   Follow up plan: As needed Assunta Found, MD [facility]

## 2018-11-18 ENCOUNTER — Other Ambulatory Visit: Payer: Self-pay | Admitting: *Deleted

## 2018-11-18 ENCOUNTER — Other Ambulatory Visit: Payer: Self-pay | Admitting: Pediatrics

## 2018-11-18 DIAGNOSIS — G4700 Insomnia, unspecified: Secondary | ICD-10-CM

## 2018-11-18 MED ORDER — ZOLPIDEM TARTRATE 10 MG PO TABS: 10.0000 mg | ORAL_TABLET | Freq: Every evening | ORAL | 0 refills | Status: DC | PRN

## 2018-11-18 NOTE — Telephone Encounter (Signed)
Patient aware.  Refill has been sent to pharmacy

## 2018-11-18 NOTE — Telephone Encounter (Signed)
Has appt on 2/7, [facility] patient.

## 2018-11-18 NOTE — Telephone Encounter (Signed)
Can approve for 1 month

## 2018-11-18 NOTE — Telephone Encounter (Signed)
Needs to be seen

## 2018-11-22 ENCOUNTER — Encounter: Payer: Self-pay | Admitting: Family Medicine

## 2018-11-22 ENCOUNTER — Ambulatory Visit (INDEPENDENT_AMBULATORY_CARE_PROVIDER_SITE_OTHER): Payer: 59 | Admitting: Family Medicine

## 2018-11-22 VITALS — BP 151/86 | HR 88 | Temp 97.4°F | Ht 63.0 in | Wt 151.0 lb

## 2018-11-22 DIAGNOSIS — G4701 Insomnia due to medical condition: Secondary | ICD-10-CM

## 2018-11-22 DIAGNOSIS — F411 Generalized anxiety disorder: Secondary | ICD-10-CM | POA: Diagnosis not present

## 2018-11-22 DIAGNOSIS — J3089 Other allergic rhinitis: Secondary | ICD-10-CM | POA: Diagnosis not present

## 2018-11-22 DIAGNOSIS — Z1239 Encounter for other screening for malignant neoplasm of breast: Secondary | ICD-10-CM

## 2018-11-22 DIAGNOSIS — K219 Gastro-esophageal reflux disease without esophagitis: Secondary | ICD-10-CM | POA: Diagnosis not present

## 2018-11-22 DIAGNOSIS — Z79899 Other long term (current) drug therapy: Secondary | ICD-10-CM | POA: Insufficient documentation

## 2018-11-22 DIAGNOSIS — J309 Allergic rhinitis, unspecified: Secondary | ICD-10-CM

## 2018-11-22 MED ORDER — DICYCLOMINE HCL 10 MG PO CAPS: 10.0000 mg | ORAL_CAPSULE | Freq: Two times a day (BID) | ORAL | 2 refills | Status: DC

## 2018-11-22 MED ORDER — OMEPRAZOLE 20 MG PO CPDR: 20.0000 mg | DELAYED_RELEASE_CAPSULE | Freq: Two times a day (BID) | ORAL | 1 refills | Status: DC

## 2018-11-22 MED ORDER — FLUOXETINE HCL 10 MG PO TABS: 10.0000 mg | ORAL_TABLET | Freq: Every day | ORAL | 3 refills | Status: DC

## 2018-11-22 MED ORDER — ZOLPIDEM TARTRATE 10 MG PO TABS: 10.0000 mg | ORAL_TABLET | Freq: Every evening | ORAL | 2 refills | Status: DC | PRN

## 2018-11-22 MED ORDER — FLUTICASONE PROPIONATE 50 MCG/ACT NA SUSP: 2.0000 | Freq: Every day | NASAL | 6 refills | Status: DC

## 2018-11-22 NOTE — Progress Notes (Signed)
Subjective:  Patient ID: Diane [name], female    DOB: 03-09-1956, 63 y.o.   MRN: [id number]  Chief Complaint:  Medical Management of Chronic Issues   HPI: Diane [name] is a 63 y.o. female presenting on 11/22/2018 for Medical Management of Chronic Issues   1. Insomnia due to medical condition  Ambien nightly. Tolerating well without associated side effects. States she does have a hard time tuning her thoughts off at night to go to sleep. States this has worsened over the last few months. States she feels she may need something to help with her anxiety.    2. Gastroesophageal reflux disease, esophagitis presence not specified  Well controlled with medications. Denies sore throat, abdominal pain, hemoptysis, cough, or dysphagia.    3. Non-seasonal allergic rhinitis due to other allergic trigger  Uses Flonase daily wight great control. States she will have breakthrough rhinorrhea during high pollen times.    4. Generalized anxiety disorder  Increasing over the last few months. Is affecting her job and sleep. States she feels "worked up" all of the time. GAD 7 : Generalized Anxiety Score 11/22/2018 04/12/2018 04/12/2018 01/11/2018  Nervous, Anxious, on Edge 2 0 0 3  Control/stop worrying 2 0 0 1  Worry too much - different things 2 0 0 1  Trouble relaxing 2 0 0 3  Restless 0 0 0 3  Easily annoyed or irritable 2 0 0 3  Afraid - awful might happen 1 0 0 0  Total GAD 7 Score 11 0 0 14  Anxiety Difficulty Not difficult at all - Not difficult at all Somewhat difficult     Relevant past medical, surgical, family, and social history reviewed and updated as indicated.  Allergies and medications reviewed and updated.   Past Medical History:  Diagnosis Date  . Anxiety   . Arthritis    fingers, left foot  . C. difficile diarrhea 2015  . COPD (chronic obstructive pulmonary disease) ([place])   . Depression   . H pylori ulcer   . Headache(784.0)   . Hypercholesterolemia   .  Kidney stones    20 years ago  . Pneumonia    5 years ago  . Seizures ([place])    2 years ago, "cluster of seizures" none since  . Shingles     Past Surgical History:  Procedure Laterality Date  . ABDOMINAL HYSTERECTOMY    . APPENDECTOMY    . CESAREAN SECTION    . CHOLECYSTECTOMY    . COLONOSCOPY  06/10/2012   RMR: Colonic polyps -removed as described above.   Marland Kitchen KNEE ARTHROSCOPY  1973   right knee,  torn cart  . LUMBAR DISC SURGERY  05/12/2015   L4 L5  . LUMBAR LAMINECTOMY/DECOMPRESSION MICRODISCECTOMY Left 05/12/2015   Procedure: Left L4-5 Microdiscectomy;  Surgeon: Marybelle Killings, MD;  Location: [facility];  Service: Orthopedics;  Laterality: Left;  . TONSILLECTOMY      Social History   Socioeconomic History  . Marital status: Married    Spouse name: [name]   . Number of children: 3  . Years of education: 12+  . Highest education level: Not on file  Occupational History    Employer: [employer]  . Financial resource strain: Not on file  . Food insecurity:    Worry: Not on file    Inability: Not on file  . Transportation needs:    Medical: Not on file    Non-medical: Not on file  Tobacco Use  . Smoking status: Former Smoker    Packs/day: 0.50    Years: 30.00    Pack years: 15.00    Types: Cigarettes    Last attempt to quit: 12/14/2017    Years since quitting: 0.9  . Smokeless tobacco: Never Used  . Tobacco comment: 5-10 cigarettes a day (plans to quit prior to this surgery 05/06/15)  Substance and Sexual Activity  . Alcohol use: Yes    Alcohol/week: 21.0 standard drinks    Types: 21 Shots of liquor per week    Comment: 3 per day  . Drug use: No  . Sexual activity: Not on file  Lifestyle  . Physical activity:    Days per week: Not on file    Minutes per session: Not on file  . Stress: Not on file  Relationships  . Social connections:    Talks on phone: Not on file    Gets together: Not on file    Attends religious service: Not on file     Active member of club or organization: Not on file    Attends meetings of clubs or organizations: Not on file    Relationship status: Not on file  . Intimate partner violence:    Fear of current or ex partner: Not on file    Emotionally abused: Not on file    Physically abused: Not on file    Forced sexual activity: Not on file  Other Topics Concern  . Not on file  Social History Narrative   The patient lives at home with husband [name].    Patient has 3 children.    Patient has he Associates Degree.    Patient is currently working.     Outpatient Encounter Medications as of 11/22/2018  Medication Sig  . cetirizine (ZYRTEC) 10 MG tablet Take 10 mg by mouth at bedtime.   . dicyclomine (BENTYL) 10 MG capsule Take 1 capsule (10 mg total) by mouth 2 (two) times daily.  . fluticasone (FLONASE) 50 MCG/ACT nasal spray Place 2 sprays into both nostrils daily.  Marland Kitchen omeprazole (PRILOSEC) 20 MG capsule Take 1 capsule (20 mg total) by mouth 2 (two) times daily before a meal.  . zolpidem (AMBIEN) 10 MG tablet Take 1 tablet (10 mg total) by mouth at bedtime as needed for sleep.  . [DISCONTINUED] dicyclomine (BENTYL) 10 MG capsule Take 1 capsule (10 mg total) by mouth 2 (two) times daily.  . [DISCONTINUED] fluticasone (FLONASE) 50 MCG/ACT nasal spray Place 2 sprays into both nostrils daily.  . [DISCONTINUED] omeprazole (PRILOSEC) 20 MG capsule Take 1 capsule (20 mg total) by mouth 2 (two) times daily before a meal.  . [DISCONTINUED] zolpidem (AMBIEN) 10 MG tablet Take 1 tablet (10 mg total) by mouth at bedtime as needed for sleep.  Marland Kitchen FLUoxetine (PROZAC) 10 MG tablet Take 1 tablet (10 mg total) by mouth daily for 30 days.  . [DISCONTINUED] azithromycin (ZITHROMAX) 250 MG tablet Take 2 the first day and then one each day after.  . [DISCONTINUED] escitalopram (LEXAPRO) 10 MG tablet Take 1.5 tablets (15 mg total) by mouth daily.  . [DISCONTINUED] simvastatin (ZOCOR) 20 MG tablet Take 1 tablet (20 mg  total) by mouth at bedtime.   No facility-administered encounter medications on file as of 11/22/2018.     Allergies  Allergen Reactions  . Penicillins     Unknown type of reaction  . Keppra [Levetiracetam]     irritability    Review of Systems  Constitutional: Negative for activity change, appetite change, chills, fatigue, fever and unexpected weight change.  Respiratory: Negative for cough, choking, chest tightness and shortness of breath.   Cardiovascular: Negative for chest pain, palpitations and leg swelling.  Gastrointestinal: Negative for abdominal distention, abdominal pain, anal bleeding, blood in stool, constipation, diarrhea, nausea, rectal pain and vomiting.  Musculoskeletal: Negative for arthralgias and myalgias.  Neurological: Negative for dizziness, tremors, seizures, syncope, facial asymmetry, speech difficulty, weakness, light-headedness, numbness and headaches.  Psychiatric/Behavioral: Positive for decreased concentration and sleep disturbance. Negative for agitation, behavioral problems, confusion, dysphoric mood, hallucinations, self-injury and suicidal ideas. The patient is nervous/anxious. The patient is not hyperactive.   All other systems reviewed and are negative.       Objective:  BP (!) 151/86   Pulse 88   Temp (!) 97.4 F (36.3 C) (Oral)   Ht 5\' 3"  (1.6 m)   Wt 151 lb (68.5 kg)   BMI 26.75 kg/m    Wt Readings from Last 3 Encounters:  11/22/18 151 lb (68.5 kg)  09/11/18 146 lb 12.8 oz (66.6 kg)  07/05/18 145 lb 6.4 oz (66 kg)    Physical Exam Vitals signs and nursing note reviewed.  Constitutional:      General: She is not in acute distress.    Appearance: Normal appearance. She is well-developed and well-groomed. She is not ill-appearing or toxic-appearing.  HENT:     Head: Normocephalic and atraumatic.     Jaw: There is normal jaw occlusion.     Right Ear: Hearing, tympanic membrane, ear canal and external ear normal.     Left Ear:  Hearing, tympanic membrane, ear canal and external ear normal.     Nose: Nose normal.     Mouth/Throat:     Lips: Pink.     Mouth: Mucous membranes are moist.     Pharynx: Oropharynx is clear.  Eyes:     General: Lids are normal.     Extraocular Movements: Extraocular movements intact.     Conjunctiva/sclera: Conjunctivae normal.     Pupils: Pupils are equal, round, and reactive to light.  Neck:     Musculoskeletal: Normal range of motion and neck supple.     Thyroid: No thyroid mass, thyromegaly or thyroid tenderness.     Vascular: No carotid bruit or JVD.  Cardiovascular:     Rate and Rhythm: Normal rate and regular rhythm.     Heart sounds: Normal heart sounds. No murmur. No friction rub. No gallop.   Pulmonary:     Effort: Pulmonary effort is normal.     Breath sounds: Normal breath sounds.  Abdominal:     General: Bowel sounds are normal. There is no distension.     Palpations: Abdomen is soft. There is no hepatomegaly, splenomegaly or mass.     Tenderness: There is no abdominal tenderness. There is no right CVA tenderness, left CVA tenderness, guarding or rebound.     Hernia: No hernia is present.  Musculoskeletal: Normal range of motion.  Lymphadenopathy:     Cervical: No cervical adenopathy.  Skin:    General: Skin is warm and dry.     Capillary Refill: Capillary refill takes less than 2 seconds.  Neurological:     General: No focal deficit present.     Mental Status: She is alert and oriented to person, place, and time.  Psychiatric:        Attention and Perception: Attention and perception normal.  Mood and Affect: Mood is anxious.        Speech: Speech normal.        Behavior: Behavior normal. Behavior is cooperative.        Thought Content: Thought content normal. Thought content is not paranoid or delusional. Thought content does not include homicidal or suicidal ideation. Thought content does not include homicidal or suicidal plan.        Cognition and  Memory: Cognition and memory normal.        Judgment: Judgment normal.     Results for orders placed or performed in visit on 03/15/18  Rapid Strep Screen ([facility] ONLY)  Result Value Ref Range   Strep Gp A Ag, IA W/Reflex Negative Negative  Culture, Group A Strep  Result Value Ref Range   Strep A Culture Negative   Culture, Group A Strep  Result Value Ref Range   Strep A Culture CANCELED        Pertinent labs & imaging results that were available during my care of the patient were reviewed by me and considered in my medical decision making.  Assessment & Plan:  Cabria was seen today for medical management of chronic issues.  Diagnoses and all orders for this visit:  Insomnia due to medical condition Stable, continue below. Will add Prozac to see if helps with racing thoughts and anxiety.  -     zolpidem (AMBIEN) 10 MG tablet; Take 1 tablet (10 mg total) by mouth at bedtime as needed for sleep.  Gastroesophageal reflux disease, esophagitis presence not specified Stable, continue below.  -     dicyclomine (BENTYL) 10 MG capsule; Take 1 capsule (10 mg total) by mouth 2 (two) times daily. -     omeprazole (PRILOSEC) 20 MG capsule; Take 1 capsule (20 mg total) by mouth 2 (two) times daily before a meal.  Non-seasonal allergic rhinitis due to other allergic trigger Stable, continue below. -     fluticasone (FLONASE) 50 MCG/ACT nasal spray; Place 2 sprays into both nostrils daily.  Generalized anxiety disorder Will trial Prozac to see if beneficial. Will start at a low dose and titrate up as needed. Report any new or worsening symptoms.  -     FLUoxetine (PROZAC) 10 MG tablet; Take 1 tablet (10 mg total) by mouth daily for 30 days.  Screening for breast cancer To schedule mammogram on the bus  Controlled substance agreement signed The Narcotic Database has been reviewed.  There were no red flags.      Continue all other maintenance medications.  Follow up plan: Return  in about 6 weeks (around 01/03/2019), or if symptoms worsen or fail to improve, for Anxiety recheck.  Educational handout given for anxiety and insomnia  The above assessment and management plan was discussed with the patient. The patient verbalized understanding of and has agreed to the management plan. Patient is aware to call the clinic if symptoms persist or worsen. Patient is aware when to return to the clinic for a follow-up visit. Patient educated on when it is appropriate to go to the emergency department.   Monia Pouch, FNP-C [facility] [phone]

## 2018-11-22 NOTE — Patient Instructions (Addendum)
Insomnia Insomnia is a sleep disorder that makes it difficult to fall asleep or stay asleep. Insomnia can cause fatigue, low energy, difficulty concentrating, mood swings, and poor performance at work or school. There are three different ways to classify insomnia:  Difficulty falling asleep.  Difficulty staying asleep.  Waking up too early in the morning. Any type of insomnia can be long-term (chronic) or short-term (acute). Both are common. Short-term insomnia usually lasts for three months or less. Chronic insomnia occurs at least three times a week for longer than three months. What are the causes? Insomnia may be caused by another condition, situation, or substance, such as:  Anxiety.  Certain medicines.  Gastroesophageal reflux disease (GERD) or other gastrointestinal conditions.  Asthma or other breathing conditions.  Restless legs syndrome, sleep apnea, or other sleep disorders.  Chronic pain.  Menopause.  Stroke.  Abuse of alcohol, tobacco, or illegal drugs.  Mental health conditions, such as depression.  Caffeine.  Neurological disorders, such as Alzheimer's disease.  An overactive thyroid (hyperthyroidism). Sometimes, the cause of insomnia may not be known. What increases the risk? Risk factors for insomnia include:  Gender. Women are affected more often than men.  Age. Insomnia is more common as you get older.  Stress.  Lack of exercise.  Irregular work schedule or working night shifts.  Traveling between different time zones.  Certain medical and mental health conditions. What are the signs or symptoms? If you have insomnia, the main symptom is having trouble falling asleep or having trouble staying asleep. This may lead to other symptoms, such as:  Feeling fatigued or having low energy.  Feeling nervous about going to sleep.  Not feeling rested in the morning.  Having trouble concentrating.  Feeling irritable, anxious, or depressed. How  is this diagnosed? This condition may be diagnosed based on:  Your symptoms and medical history. Your health care provider may ask about: ? Your sleep habits. ? Any medical conditions you have. ? Your mental health.  A physical exam. How is this treated? Treatment for insomnia depends on the cause. Treatment may focus on treating an underlying condition that is causing insomnia. Treatment may also include:  Medicines to help you sleep.  Counseling or therapy.  Lifestyle adjustments to help you sleep better. Follow these instructions at home: Eating and drinking   Limit or avoid alcohol, caffeinated beverages, and cigarettes, especially close to bedtime. These can disrupt your sleep.  Do not eat a large meal or eat spicy foods right before bedtime. This can lead to digestive discomfort that can make it hard for you to sleep. Sleep habits   Keep a sleep diary to help you and your health care provider figure out what could be causing your insomnia. Write down: ? When you sleep. ? When you wake up during the night. ? How well you sleep. ? How rested you feel the next day. ? Any side effects of medicines you are taking. ? What you eat and drink.  Make your bedroom a dark, comfortable place where it is easy to fall asleep. ? Put up shades or blackout curtains to block light from outside. ? Use a white noise machine to block noise. ? Keep the temperature cool.  Limit screen use before bedtime. This includes: ? Watching TV. ? Using your smartphone, tablet, or computer.  Stick to a routine that includes going to bed and waking up at the same times every day and night. This can help you fall asleep faster. Consider   making a quiet activity, such as reading, part of your nighttime routine.  Try to avoid taking naps during the day so that you sleep better at night.  Get out of bed if you are still awake after 15 minutes of trying to sleep. Keep the lights down, but try reading or  doing a quiet activity. When you feel sleepy, go back to bed. General instructions  Take over-the-counter and prescription medicines only as told by your health care provider.  Exercise regularly, as told by your health care provider. Avoid exercise starting several hours before bedtime.  Use relaxation techniques to manage stress. Ask your health care provider to suggest some techniques that may work well for you. These may include: ? Breathing exercises. ? Routines to release muscle tension. ? Visualizing peaceful scenes.  Make sure that you drive carefully. Avoid driving if you feel very sleepy.  Keep all follow-up visits as told by your health care provider. This is important. Contact a health care provider if:  You are tired throughout the day.  You have trouble in your daily routine due to sleepiness.  You continue to have sleep problems, or your sleep problems get worse. Get help right away if:  You have serious thoughts about hurting yourself or someone else. If you ever feel like you may hurt yourself or others, or have thoughts about taking your own life, get help right away. You can go to your nearest emergency department or call:  Your local emergency services (911 in the [place]  A suicide crisis helpline, such as the [facility] at 8783932505. This is open 24 hours a day. Summary  Insomnia is a sleep disorder that makes it difficult to fall asleep or stay asleep.  Insomnia can be long-term (chronic) or short-term (acute).  Treatment for insomnia depends on the cause. Treatment may focus on treating an underlying condition that is causing insomnia.  Keep a sleep diary to help you and your health care provider figure out what could be causing your insomnia. This information is not intended to replace advice given to you by your health care provider. Make sure you discuss any questions you have with your health care provider. Document  Released: 09/29/2000 Document Revised: 07/12/2017 Document Reviewed: 07/12/2017 Elsevier Interactive Patient Education  2019 [employer].  Generalized Anxiety Disorder, Adult Generalized anxiety disorder (GAD) is a mental health disorder. People with this condition constantly worry about everyday events. Unlike normal anxiety, worry related to GAD is not triggered by a specific event. These worries also do not fade or get better with time. GAD interferes with life functions, including relationships, work, and school. GAD can vary from mild to severe. People with severe GAD can have intense waves of anxiety with physical symptoms (panic attacks). What are the causes? The exact cause of GAD is not known. What increases the risk? This condition is more likely to develop in:  Women.  People who have a family history of anxiety disorders.  People who are very shy.  People who experience very stressful life events, such as the death of a loved one.  People who have a very stressful family environment. What are the signs or symptoms? People with GAD often worry excessively about many things in their lives, such as their health and family. They may also be overly concerned about:  Doing well at work.  Being on time.  Natural disasters.  Friendships. Physical symptoms of GAD include:  Fatigue.  Muscle tension or having  muscle twitches.  Trembling or feeling shaky.  Being easily startled.  Feeling like your heart is pounding or racing.  Feeling out of breath or like you cannot take a deep breath.  Having trouble falling asleep or staying asleep.  Sweating.  Nausea, diarrhea, or irritable bowel syndrome (IBS).  Headaches.  Trouble concentrating or remembering facts.  Restlessness.  Irritability. How is this diagnosed? Your health care provider can diagnose GAD based on your symptoms and medical history. You will also have a physical exam. The health care provider will  ask specific questions about your symptoms, including how severe they are, when they started, and if they come and go. Your health care provider may ask you about your use of alcohol or drugs, including prescription medicines. Your health care provider may refer you to a mental health specialist for further evaluation. Your health care provider will do a thorough examination and may perform additional tests to rule out other possible causes of your symptoms. To be diagnosed with GAD, a person must have anxiety that:  Is out of his or her control.  Affects several different aspects of his or her life, such as work and relationships.  Causes distress that makes him or her unable to take part in normal activities.  Includes at least three physical symptoms of GAD, such as restlessness, fatigue, trouble concentrating, irritability, muscle tension, or sleep problems. Before your health care provider can confirm a diagnosis of GAD, these symptoms must be present more days than they are not, and they must last for six months or longer. How is this treated? The following therapies are usually used to treat GAD:  Medicine. Antidepressant medicine is usually prescribed for long-term daily control. Antianxiety medicines may be added in severe cases, especially when panic attacks occur.  Talk therapy (psychotherapy). Certain types of talk therapy can be helpful in treating GAD by providing support, education, and guidance. Options include: ? Cognitive behavioral therapy (CBT). People learn coping skills and techniques to ease their anxiety. They learn to identify unrealistic or negative thoughts and behaviors and to replace them with positive ones. ? Acceptance and commitment therapy (ACT). This treatment teaches people how to be mindful as a way to cope with unwanted thoughts and feelings. ? Biofeedback. This process trains you to manage your body's response (physiological response) through breathing  techniques and relaxation methods. You will work with a therapist while machines are used to monitor your physical symptoms.  Stress management techniques. These include yoga, meditation, and exercise. A mental health specialist can help determine which treatment is best for you. Some people see improvement with one type of therapy. However, other people require a combination of therapies. Follow these instructions at home:  Take over-the-counter and prescription medicines only as told by your health care provider.  Try to maintain a normal routine.  Try to anticipate stressful situations and allow extra time to manage them.  Practice any stress management or self-calming techniques as taught by your health care provider.  Do not punish yourself for setbacks or for not making progress.  Try to recognize your accomplishments, even if they are small.  Keep all follow-up visits as told by your health care provider. This is important. Contact a health care provider if:  Your symptoms do not get better.  Your symptoms get worse.  You have signs of depression, such as: ? A persistently sad, cranky, or irritable mood. ? Loss of enjoyment in activities that used to bring you joy. ?  Change in weight or eating. ? Changes in sleeping habits. ? Avoiding friends or family members. ? Loss of energy for normal tasks. ? Feelings of guilt or worthlessness. Get help right away if:  You have serious thoughts about hurting yourself or others. If you ever feel like you may hurt yourself or others, or have thoughts about taking your own life, get help right away. You can go to your nearest emergency department or call:  Your local emergency services (911 in the [place]  A suicide crisis helpline, such as the [place] at 431 282 7955. This is open 24 hours a day. Summary  Generalized anxiety disorder (GAD) is a mental health disorder that involves worry that is not  triggered by a specific event.  People with GAD often worry excessively about many things in their lives, such as their health and family.  GAD may cause physical symptoms such as restlessness, trouble concentrating, sleep problems, frequent sweating, nausea, diarrhea, headaches, and trembling or muscle twitching.  A mental health specialist can help determine which treatment is best for you. Some people see improvement with one type of therapy. However, other people require a combination of therapies. This information is not intended to replace advice given to you by your health care provider. Make sure you discuss any questions you have with your health care provider. Document Released: 01/27/2013 Document Revised: 08/22/2016 Document Reviewed: 08/22/2016 Elsevier Interactive Patient Education  2019 [employer].

## 2018-12-13 ENCOUNTER — Ambulatory Visit (INDEPENDENT_AMBULATORY_CARE_PROVIDER_SITE_OTHER): Payer: 59 | Admitting: Family Medicine

## 2018-12-13 ENCOUNTER — Encounter: Payer: Self-pay | Admitting: Family Medicine

## 2018-12-13 VITALS — BP 126/82 | HR 86 | Temp 98.6°F | Ht 63.0 in | Wt 146.0 lb

## 2018-12-13 DIAGNOSIS — B029 Zoster without complications: Secondary | ICD-10-CM

## 2018-12-13 DIAGNOSIS — R5383 Other fatigue: Secondary | ICD-10-CM | POA: Diagnosis not present

## 2018-12-13 DIAGNOSIS — K219 Gastro-esophageal reflux disease without esophagitis: Secondary | ICD-10-CM

## 2018-12-13 MED ORDER — VALACYCLOVIR HCL 1 G PO TABS: 1000.0000 mg | ORAL_TABLET | Freq: Three times a day (TID) | ORAL | 0 refills | Status: AC

## 2018-12-13 NOTE — Progress Notes (Signed)
Subjective:  Patient ID: Diane [name], female    DOB: [birth year], 63 y.o.   MRN: [id number]  Chief Complaint:  Nausea, achy, rash on back, pain in groin area, headache, fa (symptoms began 2 days ago)   HPI: Diane [name] is a 63 y.o. female presenting on 12/13/2018 for Nausea, achy, rash on back, pain in groin area, headache, fa (symptoms began 2 days ago)  Pt presents today with complaints of a rash to her sacrum and left flank. Pt states this started 2 days ago. States she also has myalgias, fatigue, nausea, and intermittent epigastric pain. She reports she has a history of GERD but takes her omeprazole intermittently. She denies cough, vomiting, dysphagia, changes in voice, or hemoptysis. She states the nausea is intermittent and when she has the burning epigastric pain. States the pain can be 7/10 at worst. She states the rash is pruritic and burning and causes pain to shoot into her left groin and left leg. She states the pain can be 8/10 at worst. She has not tired anything for the symptoms.   Relevant past medical, surgical, family, and social history reviewed and updated as indicated.  Allergies and medications reviewed and updated.   Past Medical History:  Diagnosis Date  . Anxiety   . Arthritis    fingers, left foot  . C. difficile diarrhea 2015  . COPD (chronic obstructive pulmonary disease) ([place])   . Depression   . H pylori ulcer   . Headache(784.0)   . Hypercholesterolemia   . Kidney stones    20 years ago  . Pneumonia    5 years ago  . Seizures ([place])    2 years ago, "cluster of seizures" none since  . Shingles     Past Surgical History:  Procedure Laterality Date  . ABDOMINAL HYSTERECTOMY    . APPENDECTOMY    . CESAREAN SECTION    . CHOLECYSTECTOMY    . COLONOSCOPY  06/10/2012   RMR: Colonic polyps -removed as described above.   Marland Kitchen KNEE ARTHROSCOPY  1973   right knee,  torn cart  . LUMBAR DISC SURGERY  05/12/2015   L4 L5  . LUMBAR  LAMINECTOMY/DECOMPRESSION MICRODISCECTOMY Left 05/12/2015   Procedure: Left L4-5 Microdiscectomy;  Surgeon: Marybelle Killings, MD;  Location: [facility];  Service: Orthopedics;  Laterality: Left;  . TONSILLECTOMY      Social History   Socioeconomic History  . Marital status: Married    Spouse name: Diane [name]   . Number of children: 3  . Years of education: 12+  . Highest education level: Not on file  Occupational History    Employer: [employer]  . Financial resource strain: Not on file  . Food insecurity:    Worry: Not on file    Inability: Not on file  . Transportation needs:    Medical: Not on file    Non-medical: Not on file  Tobacco Use  . Smoking status: Former Smoker    Packs/day: 0.50    Years: 30.00    Pack years: 15.00    Types: Cigarettes    Last attempt to quit: 12/14/2017    Years since quitting: 0.9  . Smokeless tobacco: Never Used  . Tobacco comment: 5-10 cigarettes a day (plans to quit prior to this surgery 05/06/15)  Substance and Sexual Activity  . Alcohol use: Yes    Alcohol/week: 21.0 standard drinks    Types: 21 Shots of liquor per week  Comment: 3 per day  . Drug use: No  . Sexual activity: Not on file  Lifestyle  . Physical activity:    Days per week: Not on file    Minutes per session: Not on file  . Stress: Not on file  Relationships  . Social connections:    Talks on phone: Not on file    Gets together: Not on file    Attends religious service: Not on file    Active member of club or organization: Not on file    Attends meetings of clubs or organizations: Not on file    Relationship status: Not on file  . Intimate partner violence:    Fear of current or ex partner: Not on file    Emotionally abused: Not on file    Physically abused: Not on file    Forced sexual activity: Not on file  Other Topics Concern  . Not on file  Social History Narrative   The patient lives at home with husband Diane [name].    Patient has 3  children.    Patient has he Associates Degree.    Patient is currently working.     Outpatient Encounter Medications as of 12/13/2018  Medication Sig  . cetirizine (ZYRTEC) 10 MG tablet Take 10 mg by mouth at bedtime.   . dicyclomine (BENTYL) 10 MG capsule Take 1 capsule (10 mg total) by mouth 2 (two) times daily.  Marland Kitchen FLUoxetine (PROZAC) 10 MG tablet Take 1 tablet (10 mg total) by mouth daily for 30 days.  . fluticasone (FLONASE) 50 MCG/ACT nasal spray Place 2 sprays into both nostrils daily.  Marland Kitchen omeprazole (PRILOSEC) 20 MG capsule Take 1 capsule (20 mg total) by mouth 2 (two) times daily before a meal.  . zolpidem (AMBIEN) 10 MG tablet Take 1 tablet (10 mg total) by mouth at bedtime as needed for sleep.  . valACYclovir (VALTREX) 1000 MG tablet Take 1 tablet (1,000 mg total) by mouth 3 (three) times daily for 7 days.   No facility-administered encounter medications on file as of 12/13/2018.     Allergies  Allergen Reactions  . Penicillins     Unknown type of reaction  . Keppra [Levetiracetam]     irritability    Review of Systems  Constitutional: Positive for activity change and fatigue. Negative for appetite change, chills and fever.  HENT: Negative for ear discharge, ear pain, sore throat, trouble swallowing and voice change.   Eyes: Negative for photophobia, pain, redness, itching and visual disturbance.  Respiratory: Negative for cough, choking and shortness of breath.   Cardiovascular: Negative for chest pain, palpitations and leg swelling.  Gastrointestinal: Positive for abdominal pain (epigastric) and nausea. Negative for anal bleeding, blood in stool, constipation and vomiting.  Musculoskeletal: Positive for myalgias. Negative for arthralgias, back pain, gait problem, joint swelling, neck pain and neck stiffness.  Skin: Positive for rash.  Neurological: Positive for headaches (intermittent). Negative for dizziness, tremors, seizures, syncope, facial asymmetry, speech  difficulty, weakness, light-headedness and numbness.  Psychiatric/Behavioral: Negative for confusion.  All other systems reviewed and are negative.       Objective:  BP 126/82   Pulse 86   Temp 98.6 F (37 C) (Oral)   Ht 5\' 3"  (1.6 m)   Wt 146 lb (66.2 kg)   BMI 25.86 kg/m    Wt Readings from Last 3 Encounters:  12/13/18 146 lb (66.2 kg)  11/22/18 151 lb (68.5 kg)  09/11/18 146 lb 12.8 oz (66.6 kg)  Physical Exam Vitals signs and nursing note reviewed.  Constitutional:      General: She is not in acute distress.    Appearance: Normal appearance. She is not ill-appearing or toxic-appearing.  HENT:     Head: Normocephalic and atraumatic.     Right Ear: Tympanic membrane, ear canal and external ear normal.     Left Ear: Tympanic membrane, ear canal and external ear normal.     Nose: Nose normal.     Mouth/Throat:     Mouth: Mucous membranes are moist.     Pharynx: Oropharynx is clear.  Eyes:     General: Lids are normal.     Extraocular Movements: Extraocular movements intact.     Conjunctiva/sclera: Conjunctivae normal.     Pupils: Pupils are equal, round, and reactive to light.  Neck:     Musculoskeletal: Full passive range of motion without pain and neck supple.  Cardiovascular:     Rate and Rhythm: Normal rate and regular rhythm.     Pulses: Normal pulses.     Heart sounds: Normal heart sounds. No murmur. No friction rub. No gallop.   Pulmonary:     Effort: Pulmonary effort is normal.     Breath sounds: Normal breath sounds.  Abdominal:     General: Bowel sounds are normal. There is no distension.     Palpations: Abdomen is soft. There is no hepatomegaly or splenomegaly.     Tenderness: There is abdominal tenderness (mild) in the epigastric area. There is no right CVA tenderness, left CVA tenderness, guarding or rebound. Negative signs include Murphy's sign and McBurney's sign.     Hernia: No hernia is present.  Lymphadenopathy:     Cervical: No cervical  adenopathy.     Upper Body:     Right upper body: No supraclavicular or axillary adenopathy.     Left upper body: No supraclavicular or axillary adenopathy.     Lower Body: No right inguinal adenopathy. No left inguinal adenopathy.  Skin:    General: Skin is warm and dry.     Capillary Refill: Capillary refill takes less than 2 seconds.     Findings: Rash present. Rash is vesicular.          Comments: Erythematous grouped vesicular rash as denoted in a dermatomal pattern  Neurological:     General: No focal deficit present.     Mental Status: She is alert and oriented to person, place, and time.     Cranial Nerves: Cranial nerves are intact.     Sensory: Sensation is intact.     Motor: Motor function is intact.  Psychiatric:        Mood and Affect: Mood normal.        Behavior: Behavior normal. Behavior is cooperative.        Thought Content: Thought content normal.        Judgment: Judgment normal.     Results for orders placed or performed in visit on 03/15/18  Rapid Strep Screen ([facility] ONLY)  Result Value Ref Range   Strep Gp A Ag, IA W/Reflex Negative Negative  Culture, Group A Strep  Result Value Ref Range   Strep A Culture Negative   Culture, Group A Strep  Result Value Ref Range   Strep A Culture CANCELED        Pertinent labs & imaging results that were available during my care of the patient were reviewed by me and considered in my medical decision  making.  Assessment & Plan:  Deryn was seen today for nausea, achy, rash on back, pain in groin area, headache, fa.  Diagnoses and all orders for this visit:  Herpes zoster without complication Onset of rash 2 days ago. Will treat with antivirals. Report any new or worsening symptoms. Reevaluation in 2 weeks.  -     valACYclovir (VALTREX) 1000 MG tablet; Take 1 tablet (1,000 mg total) by mouth 3 (three) times daily for 7 days.  Gastroesophageal reflux disease without esophagitis Proper dosing of omeprazole  discussed. Report any new or worsening symptoms.  Other fatigue Labs pending. May be due to current shingles outbreak.  -     TSH     Continue all other maintenance medications.  Follow up plan: Return in about 2 weeks (around 12/27/2018), or if symptoms worsen or fail to improve.  Educational handout given for shingles  The above assessment and management plan was discussed with the patient. The patient verbalized understanding of and has agreed to the management plan. Patient is aware to call the clinic if symptoms persist or worsen. Patient is aware when to return to the clinic for a follow-up visit. Patient educated on when it is appropriate to go to the emergency department.   Monia Pouch, FNP-C [facility] [phone]

## 2018-12-13 NOTE — Patient Instructions (Signed)
Shingles    Shingles is an infection. It gives you a painful skin rash and blisters that have fluid in them. Shingles is caused by the same germ (virus) that causes chickenpox.  Shingles only happens in people who:   Have had chickenpox.   Have been given a shot of medicine (vaccine) to protect against chickenpox. Shingles is rare in this group.  The first symptoms of shingles may be itching, tingling, or pain in an area on your skin. A rash will show on your skin a few days or weeks later. The rash is likely to be on one side of your body. The rash usually has a shape like a belt or a band. Over time, the rash turns into fluid-filled blisters. The blisters will break open, change into scabs, and dry up. Medicines may:   Help with pain and itching.   Help you get better sooner.   Help to prevent long-term problems.  Follow these instructions at home:  Medicines   Take over-the-counter and prescription medicines only as told by your doctor.   Put on an anti-itch cream or numbing cream where you have a rash, blisters, or scabs. Do this as told by your doctor.  Helping with itching and discomfort     Put cold, wet cloths (cold compresses) on the area of the rash or blisters as told by your doctor.   Cool baths can help you feel better. Try adding baking soda or dry oatmeal to the water to lessen itching. Do not bathe in hot water.  Blister and rash care   Keep your rash covered with a loose bandage (dressing).   Wear loose clothing that does not rub on your rash.   Keep your rash and blisters clean. To do this, wash the area with mild soap and cool water as told by your doctor.   Check your rash every day for signs of infection. Check for:  ? More redness, swelling, or pain.  ? Fluid or blood.  ? Warmth.  ? Pus or a bad smell.   Do not scratch your rash. Do not pick at your blisters. To help you to not scratch:  ? Keep your fingernails clean and cut short.  ? Wear gloves or mittens when you sleep, if  scratching is a problem.  General instructions   Rest as told by your doctor.   Keep all follow-up visits as told by your doctor. This is important.   Wash your hands often with soap and water. If soap and water are not available, use hand sanitizer. Doing this lowers your chance of getting a skin infection caused by germs (bacteria).   Your infection can cause chickenpox in people who have never had chickenpox or never got a shot of chickenpox vaccine. If you have blisters that did not change into scabs yet, try not to touch other people or be around other people, especially:  ? Babies.  ? Pregnant women.  ? Children who have areas of red, itchy, or rough skin (eczema).  ? Very old people who have transplants.  ? People who have a long-term (chronic) sickness, like cancer or AIDS.  Contact a doctor if:   Your pain does not get better with medicine.   Your pain does not get better after the rash heals.   You have any signs of infection in the rash area. These signs include:  ? More redness, swelling, or pain around the rash.  ? Fluid or blood coming from   the rash.  ? The rash area feeling warm to the touch.  ? Pus or a bad smell coming from the rash.  Get help right away if:   The rash is on your face or nose.   You have pain in your face or pain by your eye.   You lose feeling on one side of your face.   You have trouble seeing.   You have ear pain, or you have ringing in your ear.   You have a loss of taste.   Your condition gets worse.  Summary   Shingles gives you a painful skin rash and blisters that have fluid in them.   Shingles is an infection. It is caused by the same germ (virus) that causes chickenpox.   Keep your rash covered with a loose bandage (dressing). Wear loose clothing that does not rub on your rash.   If you have blisters that did not change into scabs yet, try not to touch other people or be around people.  This information is not intended to replace advice given to you by  your health care provider. Make sure you discuss any questions you have with your health care provider.  Document Released: 03/20/2008 Document Revised: 06/06/2017 Document Reviewed: 06/06/2017  Elsevier Interactive Patient Education  2019 [employer] Inc[employer]

## 2018-12-14 LAB — TSH: TSH: 1.02 u[IU]/mL (ref 0.450–4.500)

## 2018-12-26 ENCOUNTER — Ambulatory Visit: Payer: 59 | Admitting: Family Medicine

## 2019-01-03 ENCOUNTER — Ambulatory Visit: Payer: 59 | Admitting: Family Medicine

## 2019-01-14 ENCOUNTER — Other Ambulatory Visit: Payer: Self-pay

## 2019-01-14 ENCOUNTER — Encounter: Payer: Self-pay | Admitting: Family Medicine

## 2019-01-14 ENCOUNTER — Ambulatory Visit (INDEPENDENT_AMBULATORY_CARE_PROVIDER_SITE_OTHER): Payer: 59 | Admitting: Family Medicine

## 2019-01-14 DIAGNOSIS — F39 Unspecified mood [affective] disorder: Secondary | ICD-10-CM

## 2019-01-14 DIAGNOSIS — F411 Generalized anxiety disorder: Secondary | ICD-10-CM | POA: Diagnosis not present

## 2019-01-14 DIAGNOSIS — G4701 Insomnia due to medical condition: Secondary | ICD-10-CM

## 2019-01-14 MED ORDER — ZOLPIDEM TARTRATE 10 MG PO TABS: 10.0000 mg | ORAL_TABLET | Freq: Every evening | ORAL | 2 refills | Status: DC | PRN

## 2019-01-14 MED ORDER — FLUOXETINE HCL 10 MG PO TABS: 10.0000 mg | ORAL_TABLET | Freq: Every day | ORAL | 3 refills | Status: DC

## 2019-01-14 NOTE — Progress Notes (Signed)
Virtual Visit via telephone Note  I connected with [name] on 01/14/19 at 1150 by telephone and verified that I am speaking with the correct person using two identifiers. Diane [name] is currently located at home and family is currently with them during visit. The provider, Monia Pouch, FNP is located in their office at time of visit.  I discussed the limitations, risks, security and privacy concerns of performing an evaluation and management service by telephone and the availability of in person appointments. I also discussed with the patient that there may be a patient responsible charge related to this service. The patient expressed understanding and agreed to proceed.  Subjective:  Patient ID: Diane [name], female    DOB: Jun 15, 1956, 63 y.o.   MRN: [id number]  Chief Complaint:  Anxiety and Insomnia   HPI: Diane [name] is a 62 y.o. female presenting on 01/14/2019 for Anxiety and Insomnia   Pt states she is doing much better since the initiation of the fluoxetine. Pt states she is tolerating the medication well without associated side effects. States her anxiety has decreased tremendously. She reports things are going really well. She states she is still taking the Ambien as needed for sleep and needs to continue this at this time. States she has tried to stop taking this but was unable to sleep. She denies associated side effects from the Ambien. No other complaints or concerns.    Relevant past medical, surgical, family, and social history reviewed and updated as indicated.  Allergies and medications reviewed and updated.   Past Medical History:  Diagnosis Date  . Anxiety   . Arthritis    fingers, left foot  . C. difficile diarrhea 2015  . COPD (chronic obstructive pulmonary disease) ([place])   . Depression   . H pylori ulcer   . Headache(784.0)   . Hypercholesterolemia   . Kidney stones    20 years ago  . Pneumonia    5 years ago  .  Seizures ([place])    2 years ago, "cluster of seizures" none since  . Shingles     Past Surgical History:  Procedure Laterality Date  . ABDOMINAL HYSTERECTOMY    . APPENDECTOMY    . CESAREAN SECTION    . CHOLECYSTECTOMY    . COLONOSCOPY  06/10/2012   RMR: Colonic polyps -removed as described above.   Marland Kitchen KNEE ARTHROSCOPY  1973   right knee,  torn cart  . LUMBAR DISC SURGERY  05/12/2015   L4 L5  . LUMBAR LAMINECTOMY/DECOMPRESSION MICRODISCECTOMY Left 05/12/2015   Procedure: Left L4-5 Microdiscectomy;  Surgeon: Marybelle Killings, MD;  Location: [facility];  Service: Orthopedics;  Laterality: Left;  . TONSILLECTOMY      Social History   Socioeconomic History  . Marital status: Married    Spouse name: [name]   . Number of children: 3  . Years of education: 12+  . Highest education level: Not on file  Occupational History    Employer: [employer]  . Financial resource strain: Not on file  . Food insecurity:    Worry: Not on file    Inability: Not on file  . Transportation needs:    Medical: Not on file    Non-medical: Not on file  Tobacco Use  . Smoking status: Former Smoker    Packs/day: 0.50    Years: 30.00    Pack years: 15.00    Types: Cigarettes    Last attempt to quit: 12/14/2017  Years since quitting: 1.0  . Smokeless tobacco: Never Used  . Tobacco comment: 5-10 cigarettes a day (plans to quit prior to this surgery 05/06/15)  Substance and Sexual Activity  . Alcohol use: Yes    Alcohol/week: 21.0 standard drinks    Types: 21 Shots of liquor per week    Comment: 3 per day  . Drug use: No  . Sexual activity: Not on file  Lifestyle  . Physical activity:    Days per week: Not on file    Minutes per session: Not on file  . Stress: Not on file  Relationships  . Social connections:    Talks on phone: Not on file    Gets together: Not on file    Attends religious service: Not on file    Active member of club or organization: Not on file    Attends  meetings of clubs or organizations: Not on file    Relationship status: Not on file  . Intimate partner violence:    Fear of current or ex partner: Not on file    Emotionally abused: Not on file    Physically abused: Not on file    Forced sexual activity: Not on file  Other Topics Concern  . Not on file  Social History Narrative   The patient lives at home with husband [name].    Patient has 3 children.    Patient has he Associates Degree.    Patient is currently working.     Outpatient Encounter Medications as of 01/14/2019  Medication Sig  . cetirizine (ZYRTEC) 10 MG tablet Take 10 mg by mouth at bedtime.   . dicyclomine (BENTYL) 10 MG capsule Take 1 capsule (10 mg total) by mouth 2 (two) times daily.  Marland Kitchen FLUoxetine (PROZAC) 10 MG tablet Take 1 tablet (10 mg total) by mouth daily for 30 days.  . fluticasone (FLONASE) 50 MCG/ACT nasal spray Place 2 sprays into both nostrils daily.  Marland Kitchen omeprazole (PRILOSEC) 20 MG capsule Take 1 capsule (20 mg total) by mouth 2 (two) times daily before a meal.  . zolpidem (AMBIEN) 10 MG tablet Take 1 tablet (10 mg total) by mouth at bedtime as needed for sleep.  . [DISCONTINUED] FLUoxetine (PROZAC) 10 MG tablet Take 1 tablet (10 mg total) by mouth daily for 30 days.  . [DISCONTINUED] zolpidem (AMBIEN) 10 MG tablet Take 1 tablet (10 mg total) by mouth at bedtime as needed for sleep.  . [DISCONTINUED] zolpidem (AMBIEN) 10 MG tablet Take 1 tablet (10 mg total) by mouth at bedtime as needed for sleep.   No facility-administered encounter medications on file as of 01/14/2019.     Allergies  Allergen Reactions  . Penicillins     Unknown type of reaction  . Keppra [Levetiracetam]     irritability    Review of Systems  Constitutional: Negative for appetite change, chills, fatigue, fever and unexpected weight change.  Eyes: Negative for photophobia and visual disturbance.  Respiratory: Negative for cough and shortness of breath.   Cardiovascular:  Negative for chest pain, palpitations and leg swelling.  Gastrointestinal: Negative for abdominal pain, constipation, diarrhea, nausea and vomiting.  Endocrine: Negative for polydipsia, polyphagia and polyuria.  Genitourinary: Negative for decreased urine volume and difficulty urinating.  Neurological: Negative for dizziness, light-headedness and headaches.  Psychiatric/Behavioral: Positive for sleep disturbance. Negative for agitation, behavioral problems, confusion, decreased concentration, dysphoric mood, hallucinations, self-injury and suicidal ideas. The patient is nervous/anxious. The patient is not hyperactive.   All  other systems reviewed and are negative.        Observations/Objective: No VS, this was a telephone or virtual health encounter.  Pt alert and oriented, answers all questions appropriately, and able to speak in full sentences.    Assessment and Plan: Diane [name] was seen today for anxiety and insomnia.  Diagnoses and all orders for this visit:  Generalized anxiety disorder Continue medications as prescribed. Report any new or worsening symptoms.  -     FLUoxetine (PROZAC) 10 MG tablet; Take 1 tablet (10 mg total) by mouth daily for 30 days.  Insomnia due to medical condition Sleep hygiene discussed. Medications as prescribed. Report any new or worsening symptoms.  -     zolpidem (AMBIEN) 10 MG tablet; Take 1 tablet (10 mg total) by mouth at bedtime as needed for sleep.  Mood disorder ([place]) Continue medications as prescribed. Report any new or worsening symptoms.  -     FLUoxetine (PROZAC) 10 MG tablet; Take 1 tablet (10 mg total) by mouth daily for 30 days.     Follow Up Instructions: Return in about 3 months (around 04/15/2019), or if symptoms worsen or fail to improve, for GAD, Insomnia.    I discussed the assessment and treatment plan with the patient. The patient was provided an opportunity to ask questions and all were answered. The patient agreed with the  plan and demonstrated an understanding of the instructions.   The patient was advised to call back or seek an in-person evaluation if the symptoms worsen or if the condition fails to improve as anticipated.  The above assessment and management plan was discussed with the patient. The patient verbalized understanding of and has agreed to the management plan. Patient is aware to call the clinic if symptoms persist or worsen. Patient is aware when to return to the clinic for a follow-up visit. Patient educated on when it is appropriate to go to the emergency department.    I provided 15 minutes of non-face-to-face time during this encounter. The call started at 1150. The call ended at 1205.   Monia Pouch, FNP-C [facility] [address] [place], [place] [address] [phone]

## 2019-04-14 ENCOUNTER — Other Ambulatory Visit: Payer: Self-pay

## 2019-04-14 ENCOUNTER — Encounter: Payer: Self-pay | Admitting: Family Medicine

## 2019-04-14 ENCOUNTER — Ambulatory Visit (INDEPENDENT_AMBULATORY_CARE_PROVIDER_SITE_OTHER): Payer: 59 | Admitting: Family Medicine

## 2019-04-14 VITALS — BP 151/71 | HR 70 | Temp 97.5°F | Ht 63.0 in | Wt 148.0 lb

## 2019-04-14 DIAGNOSIS — G4701 Insomnia due to medical condition: Secondary | ICD-10-CM

## 2019-04-14 DIAGNOSIS — W57XXXA Bitten or stung by nonvenomous insect and other nonvenomous arthropods, initial encounter: Secondary | ICD-10-CM

## 2019-04-14 DIAGNOSIS — F39 Unspecified mood [affective] disorder: Secondary | ICD-10-CM | POA: Diagnosis not present

## 2019-04-14 DIAGNOSIS — L853 Xerosis cutis: Secondary | ICD-10-CM

## 2019-04-14 DIAGNOSIS — F411 Generalized anxiety disorder: Secondary | ICD-10-CM

## 2019-04-14 MED ORDER — ZOLPIDEM TARTRATE 10 MG PO TABS: 10.0000 mg | ORAL_TABLET | Freq: Every evening | ORAL | 2 refills | Status: DC | PRN

## 2019-04-14 MED ORDER — TRIAMCINOLONE ACETONIDE 0.1 % EX CREA: 1.0000 "application " | TOPICAL_CREAM | Freq: Two times a day (BID) | CUTANEOUS | 0 refills | Status: DC

## 2019-04-14 MED ORDER — FLUOXETINE HCL 20 MG PO TABS: 20.0000 mg | ORAL_TABLET | Freq: Every day | ORAL | 3 refills | Status: DC

## 2019-04-14 NOTE — Progress Notes (Signed)
Subjective:  Patient ID: Diane [name], female    DOB: [birth year], 63 y.o.   MRN: [id number]  Chief Complaint:  Rash and Tick Removal (removed 1 week ago )   HPI: Diane [name] is a 63 y.o. female presenting on 04/14/2019 for Rash and Tick Removal (removed 1 week ago )   1. Xerosis of skin   2. Tick bite, initial encounter  Pt reports ongoing rash to bilateral lower legs. States the rash is pruritic at times. States she noticed this about 2 months ago. States she has tried lotions and benadryl without relief of symptoms. She states she has removed several ticks that were embedded over the last few weeks with the most recent one being removed from her right knee 2 days ago. She denies fever, chills, weakness, confusion, fatigue, malaise, abdominal pain, nausea, vomiting and diarrhea. She states she has not had any increase in arthralgias or myalgias. She is not sure if the rash is correlated with what she eats. No facial swelling. No dyspnea.    3. Generalized anxiety disorder   4. Mood disorder ([place])   5. Insomnia due to medical condition  States she feels she may need to increase her anxiety medications. States she has noticed an increase in her anxiety and irritability since the COVID-19 pandemic. States she is having a harder time sleeping due to the increase in anxiety. States she is taking her fluoxetine as prescribed and without adverse drug reactions. No SI or HI.   Depression screen [facility] 2/9 04/14/2019 01/14/2019 12/13/2018 11/22/2018 09/11/2018  Decreased Interest 0 0 0 0 0  Down, Depressed, Hopeless 0 0 0 0 0  PHQ - 2 Score 0 0 0 0 0  Altered sleeping - - - - -  Tired, decreased energy - - - - -  Change in appetite - - - - -  Feeling bad or failure about yourself  - - - - -  Trouble concentrating - - - - -  Moving slowly or fidgety/restless - - - - -  Suicidal thoughts - - - - -  PHQ-9 Score - - - - -  Difficult doing work/chores - - - - -   GAD 7 : Generalized  Anxiety Score 04/14/2019 01/14/2019 11/22/2018 04/12/2018  Nervous, Anxious, on Edge 2 1 2  0  Control/stop worrying 2 0 2 0  Worry too much - different things 2 1 2  0  Trouble relaxing 2 0 2 0  Restless 0 0 0 0  Easily annoyed or irritable 1 0 2 0  Afraid - awful might happen 1 1 1  0  Total GAD 7 Score 10 3 11  0  Anxiety Difficulty - Not difficult at all Not difficult at all -         Relevant past medical, surgical, family, and social history reviewed and updated as indicated.  Allergies and medications reviewed and updated.   Past Medical History:  Diagnosis Date  . Anxiety   . Arthritis    fingers, left foot  . C. difficile diarrhea 2015  . COPD (chronic obstructive pulmonary disease) ([place])   . Depression   . H pylori ulcer   . Headache(784.0)   . Hypercholesterolemia   . Kidney stones    20 years ago  . Pneumonia    5 years ago  . Seizures ([place])    2 years ago, "cluster of seizures" none since  . Shingles     Past Surgical History:  Procedure Laterality  Date  . ABDOMINAL HYSTERECTOMY    . APPENDECTOMY    . CESAREAN SECTION    . CHOLECYSTECTOMY    . COLONOSCOPY  06/10/2012   RMR: Colonic polyps -removed as described above.   Marland Kitchen KNEE ARTHROSCOPY  1973   right knee,  torn cart  . LUMBAR DISC SURGERY  05/12/2015   L4 L5  . LUMBAR LAMINECTOMY/DECOMPRESSION MICRODISCECTOMY Left 05/12/2015   Procedure: Left L4-5 Microdiscectomy;  Surgeon: Marybelle Killings, MD;  Location: [facility];  Service: Orthopedics;  Laterality: Left;  . TONSILLECTOMY      Social History   Socioeconomic History  . Marital status: Married    Spouse name: [name]   . Number of children: 3  . Years of education: 12+  . Highest education level: Not on file  Occupational History    Employer: [employer]  . Financial resource strain: Not on file  . Food insecurity    Worry: Not on file    Inability: Not on file  . Transportation needs    Medical: Not on file     Non-medical: Not on file  Tobacco Use  . Smoking status: Former Smoker    Packs/day: 0.50    Years: 30.00    Pack years: 15.00    Types: Cigarettes    Quit date: 12/14/2017    Years since quitting: 1.3  . Smokeless tobacco: Never Used  . Tobacco comment: 5-10 cigarettes a day (plans to quit prior to this surgery 05/06/15)  Substance and Sexual Activity  . Alcohol use: Yes    Alcohol/week: 21.0 standard drinks    Types: 21 Shots of liquor per week    Comment: 3 per day  . Drug use: No  . Sexual activity: Not on file  Lifestyle  . Physical activity    Days per week: Not on file    Minutes per session: Not on file  . Stress: Not on file  Relationships  . Social Herbalist on phone: Not on file    Gets together: Not on file    Attends religious service: Not on file    Active member of club or organization: Not on file    Attends meetings of clubs or organizations: Not on file    Relationship status: Not on file  . Intimate partner violence    Fear of current or ex partner: Not on file    Emotionally abused: Not on file    Physically abused: Not on file    Forced sexual activity: Not on file  Other Topics Concern  . Not on file  Social History Narrative   The patient lives at home with husband [name].    Patient has 3 children.    Patient has he Associates Degree.    Patient is currently working.     Outpatient Encounter Medications as of 04/14/2019  Medication Sig  . cetirizine (ZYRTEC) 10 MG tablet Take 10 mg by mouth at bedtime.   . dicyclomine (BENTYL) 10 MG capsule Take 1 capsule (10 mg total) by mouth 2 (two) times daily.  . fluticasone (FLONASE) 50 MCG/ACT nasal spray Place 2 sprays into both nostrils daily.  Marland Kitchen omeprazole (PRILOSEC) 20 MG capsule Take 1 capsule (20 mg total) by mouth 2 (two) times daily before a meal.  . zolpidem (AMBIEN) 10 MG tablet Take 1 tablet (10 mg total) by mouth at bedtime as needed for sleep.  . [DISCONTINUED] FLUoxetine  (PROZAC) 10  MG tablet Take 1 tablet (10 mg total) by mouth daily for 30 days.  . [DISCONTINUED] zolpidem (AMBIEN) 10 MG tablet Take 1 tablet (10 mg total) by mouth at bedtime as needed for sleep.  Marland Kitchen FLUoxetine (PROZAC) 20 MG tablet Take 1 tablet (20 mg total) by mouth daily.  Marland Kitchen triamcinolone cream (KENALOG) 0.1 % Apply 1 application topically 2 (two) times daily.   No facility-administered encounter medications on file as of 04/14/2019.     Allergies  Allergen Reactions  . Penicillins     Unknown type of reaction  . Keppra [Levetiracetam]     irritability    Review of Systems  Constitutional: Negative for activity change, chills, fatigue, fever and unexpected weight change.  HENT: Negative for trouble swallowing and voice change.   Eyes: Negative for photophobia and visual disturbance.  Respiratory: Negative for apnea, cough, choking, chest tightness, shortness of breath, wheezing and stridor.   Cardiovascular: Negative for chest pain, palpitations and leg swelling.  Gastrointestinal: Negative for abdominal pain, constipation, diarrhea, nausea and vomiting.  Genitourinary: Negative for decreased urine volume and difficulty urinating.  Musculoskeletal: Positive for arthralgias and myalgias.  Skin: Positive for rash.  Neurological: Negative for dizziness, tremors, seizures, syncope, facial asymmetry, speech difficulty, weakness, light-headedness, numbness and headaches.  Psychiatric/Behavioral: Positive for agitation, decreased concentration and sleep disturbance. Negative for behavioral problems, confusion, dysphoric mood, hallucinations, self-injury and suicidal ideas. The patient is nervous/anxious. The patient is not hyperactive.   All other systems reviewed and are negative.       Objective:  BP (!) 151/71   Pulse 70   Temp (!) 97.5 F (36.4 C) (Oral)   Ht 5\' 3"  (1.6 m)   Wt 148 lb (67.1 kg)   BMI 26.22 kg/m    Wt Readings from Last 3 Encounters:  04/14/19 148 lb (67.1  kg)  12/13/18 146 lb (66.2 kg)  11/22/18 151 lb (68.5 kg)    Physical Exam Vitals signs and nursing note reviewed.  Constitutional:      General: She is not in acute distress.    Appearance: Normal appearance. She is well-developed and well-groomed. She is not ill-appearing, toxic-appearing or diaphoretic.  HENT:     Head: Normocephalic and atraumatic.     Mouth/Throat:     Mouth: Mucous membranes are moist.     Pharynx: Oropharynx is clear.  Eyes:     Conjunctiva/sclera: Conjunctivae normal.     Pupils: Pupils are equal, round, and reactive to light.  Neck:     Musculoskeletal: Normal range of motion and neck supple. No muscular tenderness.  Cardiovascular:     Rate and Rhythm: Normal rate and regular rhythm.     Heart sounds: Normal heart sounds. No murmur. No friction rub. No gallop.   Pulmonary:     Effort: Pulmonary effort is normal. No respiratory distress.     Breath sounds: Normal breath sounds.  Abdominal:     General: Bowel sounds are normal. There is no distension.     Palpations: Abdomen is soft.     Tenderness: There is no abdominal tenderness.  Musculoskeletal: Normal range of motion.        General: No swelling.  Lymphadenopathy:     Cervical: No cervical adenopathy.  Skin:    General: Skin is warm and dry.     Capillary Refill: Capillary refill takes less than 2 seconds.     Findings: Lesion and rash present.          Comments:  Dry, scaly rash to bilateral lower extremities with some areas of redness. No raised lesions, swelling, or drainage. No increased warmth.   Neurological:     General: No focal deficit present.     Mental Status: She is alert and oriented to person, place, and time.  Psychiatric:        Mood and Affect: Mood normal.        Behavior: Behavior normal. Behavior is cooperative.        Thought Content: Thought content normal.        Judgment: Judgment normal.     Results for orders placed or performed in visit on 12/13/18  TSH   Result Value Ref Range   TSH 1.020 0.450 - 4.500 uIU/mL       Pertinent labs & imaging results that were available during my care of the patient were reviewed by me and considered in my medical decision making.  Assessment & Plan:  Shaliyah was seen today for rash and tick removal.  Diagnoses and all orders for this visit:  Xerosis of skin Due to pruritic nature of rash will trial below. Pt aware to use jar emollients several times per day. Report any new or worsening symptoms. Medications as prescribed.  -     triamcinolone cream (KENALOG) 0.1 %; Apply 1 application topically 2 (two) times daily.  Tick bite, initial encounter Multiple tick bites with associated lower extremity rash. No bullseye rash. Concerning for tick born illness. Will test below. Pt states she would like to hold off on antibiotics until the tests result. Report any new or worsening symptoms.  -     Lyme Ab/Western Blot Reflex -     Rocky mtn spotted fvr abs pnl(IgG+IgM) -     Alpha-Gal Panel  Generalized anxiety disorder Mood disorder (HCC) Increased anxiety since onset of COVID-19 pandemic. Will increase fluoxetine today to 20 mg daily. No SI or HI. Pt aware to report any new or worsening symptoms. Follow up in 3 months for reevaluation or sooner if needed.  -     FLUoxetine (PROZAC) 20 MG tablet; Take 1 tablet (20 mg total) by mouth daily.  Insomnia due to medical condition Tolerating Ambien well. Has needed more frequently due to increased anxiety. Continue medications as prescribed. Report any new or worsening symptoms.  -     zolpidem (AMBIEN) 10 MG tablet; Take 1 tablet (10 mg total) by mouth at bedtime as needed for sleep.     Continue all other maintenance medications.  Follow up plan: Return in about 3 months (around 07/15/2019), or if symptoms worsen or fail to improve, for GAD.  Educational handout given for survey  The above assessment and management plan was discussed with the patient. The  patient verbalized understanding of and has agreed to the management plan. Patient is aware to call the clinic if symptoms persist or worsen. Patient is aware when to return to the clinic for a follow-up visit. Patient educated on when it is appropriate to go to the emergency department.   Monia Pouch, FNP-C [facility] [phone]

## 2019-04-14 NOTE — Patient Instructions (Addendum)
It was a pleasure seeing you today, [place].  Information regarding what we discussed is included in this packet.  Please report any new or worsening symptoms. Please use a jar emollient twice daily.   In a few days you may receive a survey in the mail or online from [employer] regarding your visit with [place] today. Please take a moment to fill this out. Your feedback is very important to our office. It can help [place] better understand your needs as well as improve your experience and satisfaction. Thank you for taking your time to complete it. We care about you.  Because of recent events of COVID-19 ("Coronavirus"), please follow CDC recommendations:   1. Wash your hand frequently 2. Avoid touching your face 3. Stay away from people who are sick 4. If you have symptoms such as fever, cough, shortness of breath then call your healthcare provider for further guidance 5. If you are sick, STAY AT HOME, unless otherwise directed by your healthcare provider. 6. Follow directions from state and national officials regarding staying safe    Please feel free to call our office if any questions or concerns arise.  Warm Regards, Monia Pouch, FNP-C [facility] [address] [place], [place] [address] [phone]

## 2019-04-22 LAB — ALPHA-GAL PANEL
Alpha Gal IgE*: 8.63 kU/L — ABNORMAL HIGH (ref <0.10)
Beef (Bos spp) IgE: 1.45 kU/L — ABNORMAL HIGH (ref <0.35)
Class Interpretation: 1
Class Interpretation: 2
Lamb/Mutton (Ovis spp) IgE: 0.18 kU/L (ref <0.35)
Pork (Sus spp) IgE: 0.67 kU/L — ABNORMAL HIGH (ref <0.35)

## 2019-04-22 LAB — ROCKY MTN SPOTTED FVR ABS PNL(IGG+IGM)
RMSF IgG: NEGATIVE
RMSF IgM: 0.34 index (ref 0.00–0.89)

## 2019-04-22 LAB — LYME AB/WESTERN BLOT REFLEX
LYME DISEASE AB, QUANT, IGM: <0.8 index (ref 0.00–0.79)
Lyme IgG/IgM Ab: <0.91 {ISR} (ref 0.00–0.90)

## 2019-04-29 ENCOUNTER — Encounter: Payer: Self-pay | Admitting: Allergy and Immunology

## 2019-04-29 ENCOUNTER — Other Ambulatory Visit: Payer: Self-pay

## 2019-04-29 ENCOUNTER — Other Ambulatory Visit: Payer: 59

## 2019-04-29 ENCOUNTER — Ambulatory Visit (INDEPENDENT_AMBULATORY_CARE_PROVIDER_SITE_OTHER): Payer: 59 | Admitting: Allergy and Immunology

## 2019-04-29 VITALS — BP 132/74 | HR 72 | Temp 98.2°F | Resp 16 | Ht 65.0 in | Wt 146.0 lb

## 2019-04-29 DIAGNOSIS — T7800XA Anaphylactic reaction due to unspecified food, initial encounter: Secondary | ICD-10-CM | POA: Insufficient documentation

## 2019-04-29 DIAGNOSIS — J3089 Other allergic rhinitis: Secondary | ICD-10-CM

## 2019-04-29 DIAGNOSIS — H1013 Acute atopic conjunctivitis, bilateral: Secondary | ICD-10-CM

## 2019-04-29 DIAGNOSIS — H1010 Acute atopic conjunctivitis, unspecified eye: Secondary | ICD-10-CM | POA: Insufficient documentation

## 2019-04-29 DIAGNOSIS — T7800XD Anaphylactic reaction due to unspecified food, subsequent encounter: Secondary | ICD-10-CM | POA: Diagnosis not present

## 2019-04-29 MED ORDER — AZELASTINE HCL 0.1 % NA SOLN: 1.0000 | Freq: Two times a day (BID) | NASAL | 5 refills | Status: DC | PRN

## 2019-04-29 MED ORDER — OLOPATADINE HCL 0.2 % OP SOLN: 1.0000 [drp] | Freq: Every day | OPHTHALMIC | 5 refills | Status: DC | PRN

## 2019-04-29 MED ORDER — EPINEPHRINE 0.3 MG/0.3ML IJ SOAJ: 0.3000 mg | INTRAMUSCULAR | 1 refills | Status: DC | PRN

## 2019-04-29 NOTE — Patient Instructions (Addendum)
History of food allergy The patient's history suggest the possibility of alpha gal hypersensitivity.  We will recheck blood work to confirm previous study. A laboratory order form has been provided for alpha gal panel.  For now, continue avoidance of mammalian meat.  I have encouraged her to be careful of cross-contamination.  In addition, consider avoiding products containing gelatin as well as dairy.  Should symptoms recur meat, gelatin, and dairy, a journal is to be kept recording any foods eaten, beverages consumed, and medications taken within a 6 hour time period prior to the onset of symptoms, as well as record activities being performed, and environmental conditions. For any symptoms concerning for anaphylaxis, epinephrine is to be administered and 911 is to be called immediately.  A prescription has been provided for epinephrine 0.3 mg autoinjector (AuviQ) 2 pack along with instructions for its proper administration.  Carry a "Tick-Key" to safely remove ticks.  Perennial allergic rhinitis  Aeroallergen avoidance measures have been discussed and provided in written form.  A prescription has been provided for levocetirizine, 5 mg daily as needed.  To avoid diminishing benefit with daily use (tachyphylaxis) of second generation antihistamine, consider alternating every few months between fexofenadine (Allegra) and levocetirizine (Xyzal).  A prescription has been provided for azelastine nasal spray, 1-2 sprays per nostril 2 times daily as needed. Proper nasal spray technique has been discussed and demonstrated.   Nasal saline spray (i.e., Simply Saline) or nasal saline lavage (i.e., NeilMed) is recommended as needed and prior to medicated nasal sprays.  Allergic conjunctivitis  Treatment plan as outlined above for allergic rhinitis.  A prescription has been provided for Pataday, one drop per eye daily as needed.  When lab results have returned the patient will be called with further  recommendations.  Control of House Dust Mite Allergen  House dust mites play a major role in allergic asthma and rhinitis.  They occur in environments with high humidity wherever human skin, the food for dust mites is found. High levels have been detected in dust obtained from mattresses, pillows, carpets, upholstered furniture, bed covers, clothes and soft toys.  The principal allergen of the house dust mite is found in its feces.  A gram of dust may contain 1,000 mites and 250,000 fecal particles.  Mite antigen is easily measured in the air during house cleaning activities.    1. Encase mattresses, including the box spring, and pillow, in an air tight cover.  Seal the zipper end of the encased mattresses with wide adhesive tape. 2. Wash the bedding in water of 130 degrees Farenheit weekly.  Avoid cotton comforters/quilts and flannel bedding: the most ideal bed covering is the dacron comforter. 3. Remove all upholstered furniture from the bedroom. 4. Remove carpets, carpet padding, rugs, and non-washable window drapes from the bedroom.  Wash drapes weekly or use plastic window coverings. 5. Remove all non-washable stuffed toys from the bedroom.  Wash stuffed toys weekly. 6. Have the room cleaned frequently with a vacuum cleaner and a damp dust-mop.  The patient should not be in a room which is being cleaned and should wait 1 hour after cleaning before going into the room. 7. Close and seal all heating outlets in the bedroom.  Otherwise, the room will become filled with dust-laden air.  An electric heater can be used to heat the room. 8. Reduce indoor humidity to less than 50%.  Do not use a humidifier.  Control of Dog or Cat Allergen  Avoidance is the best way  to manage a dog or cat allergy. If you have a dog or cat and are allergic to dog or cats, consider removing the dog or cat from the home. If you have a dog or cat but don't want to find it a new home, or if your family wants a pet even  though someone in the household is allergic, here are some strategies that may help keep symptoms at bay:  1. Keep the pet out of your bedroom and restrict it to only a few rooms. Be advised that keeping the dog or cat in only one room will not limit the allergens to that room. 2. Don't pet, hug or kiss the dog or cat; if you do, wash your hands with soap and water. 3. High-efficiency particulate air (HEPA) cleaners run continuously in a bedroom or living room can reduce allergen levels over time. 4. Place electrostatic material sheet in the air inlet vent in the bedroom. 5. Regular use of a high-efficiency vacuum cleaner or a central vacuum can reduce allergen levels. 6. Giving your dog or cat a bath at least once a week can reduce airborne allergen.  Control of Mold Allergen  Mold and fungi can grow on a variety of surfaces provided certain temperature and moisture conditions exist.  Outdoor molds grow on plants, decaying vegetation and soil.  The major outdoor mold, Alternaria and Cladosporium, are found in very high numbers during hot and dry conditions.  Generally, a late Summer - Fall peak is seen for common outdoor fungal spores.  Rain will temporarily lower outdoor mold spore count, but counts rise rapidly when the rainy period ends.  The most important indoor molds are Aspergillus and Penicillium.  Dark, humid and poorly ventilated basements are ideal sites for mold growth.  The next most common sites of mold growth are the bathroom and the kitchen.  Outdoor [employer] 1. Use air conditioning and keep windows closed 2. Avoid exposure to decaying vegetation. 3. Avoid leaf raking. 4. Avoid grain handling. 5. Consider wearing a face mask if working in moldy areas.  Indoor Mold Control 1. Maintain humidity below 50%. 2. Clean washable surfaces with 5% bleach solution. 3. Remove sources e.g. Contaminated carpets.

## 2019-04-29 NOTE — Progress Notes (Signed)
New Patient Note  RE: Diane [name] MRN: [id number] DOB: 1956/03/28 Date of Office Visit: 04/29/2019  Referring provider: Baruch Gouty, FNP Primary care provider: Baruch Gouty, FNP  Chief Complaint: Alpha Gal Allergy, Allergic Reaction, and Rash   History of present illness: Diane [name] is a 63 y.o. female seen today in consultation requested by [name], [place].  She reports that over the past year she has experienced recurrent episodes of abdominal discomfort/pain as well as diarrhea.  GI work-up was unrevealing and she was told that the symptoms may be related to the stress at her job.  More recently, she developed an exquisitely pruritic rash on her upper and lower extremities.  She was prescribed triamcinolone 0.1% cream with mild relief, however without complete resolution of the rash.  In addition, she had been takiing cetirizine daily over the past year to control generalized pruritus in addition to her environmental allergy symptoms, consisting of nasal congestion, rhinorrhea, sneezing, postnasal drainage, nasal pruritus, and ocular pruritus.  Because of frequent tick bites and a history of Lyme disease, she was tested for tickborne disease and was informed last week that her serum specific IgE levels were elevated to beef, pork, and galactose-alpha-1,3-galactose.  Approximately 1 week ago, she eliminated mammalian meat from her diet.  She has not eliminated gelatin or dairy from her diet.  The rash has improved, however she notes that it was starting to improve with the triamcinolone cream so she is uncertain to what extent mammalian meat avoidance has contributed to the improvement.  She notes that when evening she consumed chicken on a grill that had just been used for beef, and afterwards she experienced abdominal discomfort/pain.  While off of the cetirizine for the past several days in anticipation of today's visit, she has experienced increased generalized  pruritus.  Assessment and plan: History of food allergy The patient's history suggest the possibility of alpha gal hypersensitivity.  We will recheck blood work to confirm previous study. A laboratory order form has been provided for alpha gal panel.  For now, continue avoidance of mammalian meat.  I have encouraged her to be careful of cross-contamination.  In addition, consider avoiding products containing gelatin as well as dairy.  Should symptoms recur meat, gelatin, and dairy, a journal is to be kept recording any foods eaten, beverages consumed, and medications taken within a 6 hour time period prior to the onset of symptoms, as well as record activities being performed, and environmental conditions. For any symptoms concerning for anaphylaxis, epinephrine is to be administered and 911 is to be called immediately.  A prescription has been provided for epinephrine 0.3 mg autoinjector (AuviQ) 2 pack along with instructions for its proper administration.  Carry a "Tick-Key" to safely remove ticks.  Perennial allergic rhinitis  Aeroallergen avoidance measures have been discussed and provided in written form.  A prescription has been provided for levocetirizine, 5 mg daily as needed.  To avoid diminishing benefit with daily use (tachyphylaxis) of second generation antihistamine, consider alternating every few months between fexofenadine (Allegra) and levocetirizine (Xyzal).  A prescription has been provided for azelastine nasal spray, 1-2 sprays per nostril 2 times daily as needed. Proper nasal spray technique has been discussed and demonstrated.   Nasal saline spray (i.e., Simply Saline) or nasal saline lavage (i.e., NeilMed) is recommended as needed and prior to medicated nasal sprays.  Allergic conjunctivitis  Treatment plan as outlined above for allergic rhinitis.  A prescription has been provided for Pataday,  one drop per eye daily as needed.   Meds ordered this encounter    Medications   EPINEPHrine (AUVI-Q) 0.3 mg/0.3 mL IJ SOAJ injection    Sig: Inject 0.3 mLs (0.3 mg total) into the muscle as needed for anaphylaxis.    Dispense:  2 each    Refill:  1    [phone] (M)   Olopatadine HCl (PATADAY) 0.2 % SOLN    Sig: Place 1 drop into both eyes daily as needed.    Dispense:  2.5 mL    Refill:  5   azelastine (ASTELIN) 0.1 % nasal spray    Sig: Place 1-2 sprays into both nostrils 2 (two) times daily as needed for rhinitis.    Dispense:  30 mL    Refill:  5    Diagnostics: Environmental skin testing: Positive to mold, dog epithelia, and dust mite antigen. Food allergen skin testing: The skin test result to egg white was borderline positive/equivocal, however the patient consumes this food on a regular basis without symptoms, therefore this may be considered a false positive result.  Physical examination: Blood pressure 132/74, pulse 72, temperature 98.2 F (36.8 C), temperature source Oral, resp. rate 16, height 5\' 5"  (1.651 m), weight 146 lb (66.2 kg), SpO2 97 %.  General: Alert, interactive, in no acute distress. HEENT: TMs pearly gray, turbinates mildly edematous with clear discharge, post-pharynx mildly erythematous. Neck: Supple without lymphadenopathy. Lungs: Clear to auscultation without wheezing, rhonchi or rales. CV: Normal S1, S2 without murmurs. Abdomen: Nondistended, nontender. Skin: Warm and dry, without lesions or rashes. Extremities:  No clubbing, cyanosis or edema. Neuro:   Grossly intact.  Review of systems:  Review of systems negative except as noted in HPI / PMHx or noted below: Review of Systems  Constitutional: Negative.   HENT: Negative.   Eyes: Negative.   Respiratory: Negative.   Cardiovascular: Negative.   Gastrointestinal: Negative.   Genitourinary: Negative.   Musculoskeletal: Negative.   Skin: Negative.   Neurological: Negative.   Endo/Heme/Allergies: Negative.   Psychiatric/Behavioral: Negative.      Past medical history:  Past Medical History:  Diagnosis Date   Anxiety    Arthritis    fingers, left foot   C. difficile diarrhea 2015   COPD (chronic obstructive pulmonary disease) (HCC)    Depression    Eczema    H pylori ulcer    Headache(784.0)    Hypercholesterolemia    Kidney stones    20 years ago   Pneumonia    5 years ago   Seizures ([place])    2 years ago, "cluster of seizures" none since   Shingles     Past surgical history:  Past Surgical History:  Procedure Laterality Date   ABDOMINAL HYSTERECTOMY     ADENOIDECTOMY     APPENDECTOMY     CESAREAN SECTION     CHOLECYSTECTOMY     COLONOSCOPY  06/10/2012   RMR: Colonic polyps -removed as described above.    KNEE ARTHROSCOPY  1973   right knee,  torn cart   LUMBAR DISC SURGERY  05/12/2015   L4 L5   LUMBAR LAMINECTOMY/DECOMPRESSION MICRODISCECTOMY Left 05/12/2015   Procedure: Left L4-5 Microdiscectomy;  Surgeon: Marybelle Killings, MD;  Location: [facility];  Service: Orthopedics;  Laterality: Left;   TONSILLECTOMY      Family history: Family History  Problem Relation Age of Onset   Arthritis Mother    Asthma Mother    Depression Mother    Hyperlipidemia Mother  Varicose Veins Mother    Arthritis Father    Heart disease Father    Hyperlipidemia Father    Vision loss Father    Cancer Sister    COPD Sister    Early death Sister    Cancer Brother    Alcohol abuse Maternal Grandfather    Colon cancer Neg Hx     Social history: Social History   Socioeconomic History   Marital status: Married    Spouse name: [name]    Number of children: 3   Years of education: 12+   Highest education level: Not on file  Occupational History    Employer: [employer] manager  Social Needs   Financial resource strain: Not on file   Food insecurity    Worry: Not on file    Inability: Not on file   Transportation needs    Medical: Not on file    Non-medical: Not on file   Tobacco Use   Smoking status: Former Smoker    Packs/day: 0.50    Years: 30.00    Pack years: 15.00    Types: Cigarettes    Quit date: 12/14/2017    Years since quitting: 1.3   Smokeless tobacco: Never Used   Tobacco comment: 5-10 cigarettes a day (plans to quit prior to this surgery 05/06/15)  Substance and Sexual Activity   Alcohol use: Yes    Alcohol/week: 21.0 standard drinks    Types: 21 Shots of liquor per week    Comment: 3 per day   Drug use: No   Sexual activity: Not on file  Lifestyle   Physical activity    Days per week: Not on file    Minutes per session: Not on file   Stress: Not on file  Relationships   Social connections    Talks on phone: Not on file    Gets together: Not on file    Attends religious service: Not on file    Active member of club or organization: Not on file    Attends meetings of clubs or organizations: Not on file    Relationship status: Not on file   Intimate partner violence    Fear of current or ex partner: Not on file    Emotionally abused: Not on file    Physically abused: Not on file    Forced sexual activity: Not on file  Other Topics Concern   Not on file  Social History Narrative   The patient lives at home with husband [name].    Patient has 3 children.    Patient has he Associates Degree.    Patient is currently working.    Environmental History: The patient lives in 62 year old house with hardwood floors throughout and central air/heat.  There are 3 cats and 1 dog in the home, the cats have access to her bedroom.  There is no known mold/water damage in the home.  She is a non-smoker.  Allergies as of 04/29/2019      Reactions   Penicillins    Unknown type of reaction   Keppra [levetiracetam]    irritability      Medication List       Accurate as of April 29, 2019  1:00 PM. If you have any questions, ask your nurse or doctor.        azelastine 0.1 % nasal spray Commonly known as: ASTELIN Place 1-2  sprays into both nostrils 2 (two) times daily as needed for rhinitis. Started by:  R Edgar Frisk, MD   cetirizine 10 MG tablet Commonly known as: ZYRTEC Take 10 mg by mouth at bedtime.   dicyclomine 10 MG capsule Commonly known as: BENTYL Take 1 capsule (10 mg total) by mouth 2 (two) times daily.   EPINEPHrine 0.3 mg/0.3 mL Soaj injection Commonly known as: Auvi-Q Inject 0.3 mLs (0.3 mg total) into the muscle as needed for anaphylaxis. Started by: Edmonia Lynch, MD   FLUoxetine 20 MG tablet Commonly known as: PROZAC Take 1 tablet (20 mg total) by mouth daily.   fluticasone 50 MCG/ACT nasal spray Commonly known as: FLONASE Place 2 sprays into both nostrils daily.   Olopatadine HCl 0.2 % Soln Commonly known as: Pataday Place 1 drop into both eyes daily as needed. Started by: Edmonia Lynch, MD   omeprazole 20 MG capsule Commonly known as: PRILOSEC Take 1 capsule (20 mg total) by mouth 2 (two) times daily before a meal.   triamcinolone cream 0.1 % Commonly known as: KENALOG Apply 1 application topically 2 (two) times daily.   VITAMIN E PO Take 1 capsule by mouth daily.   zolpidem 10 MG tablet Commonly known as: AMBIEN Take 1 tablet (10 mg total) by mouth at bedtime as needed for sleep.       Known medication allergies: Allergies  Allergen Reactions   Penicillins     Unknown type of reaction   Keppra [Levetiracetam]     irritability    I appreciate the opportunity to take part in Vy's care. Please do not hesitate to contact me with questions.  Sincerely,   R. Edgar Frisk, MD

## 2019-04-29 NOTE — Assessment & Plan Note (Signed)
   Treatment plan as outlined above for allergic rhinitis.  A prescription has been provided for Pataday, one drop per eye daily as needed.

## 2019-04-29 NOTE — Assessment & Plan Note (Addendum)
The patient's history suggest the possibility of alpha gal hypersensitivity.  We will recheck blood work to confirm previous study. A laboratory order form has been provided for alpha gal panel.  For now, continue avoidance of mammalian meat.  I have encouraged her to be careful of cross-contamination.  In addition, consider avoiding products containing gelatin as well as dairy.  Should symptoms recur meat, gelatin, and dairy, a journal is to be kept recording any foods eaten, beverages consumed, and medications taken within a 6 hour time period prior to the onset of symptoms, as well as record activities being performed, and environmental conditions. For any symptoms concerning for anaphylaxis, epinephrine is to be administered and 911 is to be called immediately.  A prescription has been provided for epinephrine 0.3 mg autoinjector (AuviQ) 2 pack along with instructions for its proper administration.  Carry a "Tick-Key" to safely remove ticks.

## 2019-04-29 NOTE — Assessment & Plan Note (Signed)
   Aeroallergen avoidance measures have been discussed and provided in written form.  A prescription has been provided for levocetirizine, 5 mg daily as needed.  To avoid diminishing benefit with daily use (tachyphylaxis) of second generation antihistamine, consider alternating every few months between fexofenadine (Allegra) and levocetirizine (Xyzal).  A prescription has been provided for azelastine nasal spray, 1-2 sprays per nostril 2 times daily as needed. Proper nasal spray technique has been discussed and demonstrated.   Nasal saline spray (i.e., Simply Saline) or nasal saline lavage (i.e., NeilMed) is recommended as needed and prior to medicated nasal sprays.

## 2019-05-06 LAB — ALPHA-GAL PANEL
Alpha Gal IgE*: 9.65 kU/L — ABNORMAL HIGH (ref <0.10)
Beef (Bos spp) IgE: 1.94 kU/L — ABNORMAL HIGH (ref <0.35)
Class Interpretation: 2
Class Interpretation: 2
Lamb/Mutton (Ovis spp) IgE: 0.22 kU/L (ref <0.35)
Pork (Sus spp) IgE: 0.71 kU/L — ABNORMAL HIGH (ref <0.35)

## 2019-05-12 ENCOUNTER — Other Ambulatory Visit: Payer: Self-pay | Admitting: Family Medicine

## 2019-05-12 ENCOUNTER — Encounter: Payer: Self-pay | Admitting: Family Medicine

## 2019-05-12 DIAGNOSIS — W57XXXA Bitten or stung by nonvenomous insect and other nonvenomous arthropods, initial encounter: Secondary | ICD-10-CM

## 2019-05-12 MED ORDER — TRIAMCINOLONE ACETONIDE 0.1 % EX CREA: 1.0000 "application " | TOPICAL_CREAM | Freq: Two times a day (BID) | CUTANEOUS | 0 refills | Status: DC

## 2019-05-12 MED ORDER — DOXYCYCLINE HYCLATE 100 MG PO TABS: 100.0000 mg | ORAL_TABLET | Freq: Two times a day (BID) | ORAL | 0 refills | Status: AC

## 2019-05-12 NOTE — Progress Notes (Signed)
tra

## 2019-07-21 ENCOUNTER — Other Ambulatory Visit: Payer: Self-pay | Admitting: Family Medicine

## 2019-07-21 DIAGNOSIS — F411 Generalized anxiety disorder: Secondary | ICD-10-CM

## 2019-07-21 DIAGNOSIS — F39 Unspecified mood [affective] disorder: Secondary | ICD-10-CM

## 2019-08-01 ENCOUNTER — Other Ambulatory Visit: Payer: Self-pay | Admitting: Family Medicine

## 2019-08-01 DIAGNOSIS — G4701 Insomnia due to medical condition: Secondary | ICD-10-CM

## 2019-08-28 ENCOUNTER — Encounter: Payer: Self-pay | Admitting: Family Medicine

## 2019-08-28 ENCOUNTER — Ambulatory Visit (INDEPENDENT_AMBULATORY_CARE_PROVIDER_SITE_OTHER): Payer: 59 | Admitting: Family Medicine

## 2019-08-28 DIAGNOSIS — F39 Unspecified mood [affective] disorder: Secondary | ICD-10-CM | POA: Diagnosis not present

## 2019-08-28 DIAGNOSIS — F411 Generalized anxiety disorder: Secondary | ICD-10-CM | POA: Diagnosis not present

## 2019-08-28 DIAGNOSIS — G4701 Insomnia due to medical condition: Secondary | ICD-10-CM | POA: Diagnosis not present

## 2019-08-28 MED ORDER — FLUOXETINE HCL 20 MG PO CAPS: 20.0000 mg | ORAL_CAPSULE | Freq: Every day | ORAL | 0 refills | Status: DC

## 2019-08-28 MED ORDER — ZOLPIDEM TARTRATE 10 MG PO TABS: 10.0000 mg | ORAL_TABLET | Freq: Every evening | ORAL | 5 refills | Status: DC | PRN

## 2019-08-28 NOTE — Progress Notes (Signed)
Virtual Visit via telephone Note Due to COVID-19 pandemic this visit was conducted virtually. This visit type was conducted due to national recommendations for restrictions regarding the COVID-19 Pandemic (e.g. social distancing, sheltering in place) in an effort to limit this patient's exposure and mitigate transmission in our community. All issues noted in this document were discussed and addressed.  A physical exam was not performed with this format.   I connected with Sarika Baldini on 08/28/2019 at 1000 by telephone and verified that I am speaking with the correct person using two identifiers. Terryl Dock is currently located at home and family is currently with them during visit. The provider, Monia Pouch, FNP is located in their office at time of visit.  I discussed the limitations, risks, security and privacy concerns of performing an evaluation and management service by telephone and the availability of in person appointments. I also discussed with the patient that there may be a patient responsible charge related to this service. The patient expressed understanding and agreed to proceed.  Subjective:  Patient ID: Diane [name], female    DOB: Jan 12, 1956, 63 y.o.   MRN: [id number]  Chief Complaint:  Medical Management of Chronic Issues   HPI: Diane [name] is a 63 y.o. female presenting on 08/28/2019 for Medical Management of Chronic Issues   Pt following up today for management of her chronic medical conditions. Pt states she is doing very well overall. States she does have some anxiety over the pandemic and how things are going. States she is sheltering in place. She does continue to have trouble sleeping. She takes Ambien as needed and does very will with this. She is compliant with her fluoxetine without associated side effects. States she feels great on her current regimen.   GAD 7 : Generalized Anxiety Score 08/28/2019 04/14/2019 01/14/2019 11/22/2018   Nervous, Anxious, on Edge 1 2 1 2   Control/stop worrying 1 2 0 2  Worry too much - different things 1 2 1 2   Trouble relaxing 0 2 0 2  Restless 0 0 0 0  Easily annoyed or irritable 0 1 0 2  Afraid - awful might happen 0 1 1 1   Total GAD 7 Score 3 10 3 11   Anxiety Difficulty - - Not difficult at all Not difficult at all    Depression screen [facility] 2/9 08/28/2019 04/14/2019 01/14/2019 12/13/2018 11/22/2018  Decreased Interest 0 0 0 0 0  Down, Depressed, Hopeless 0 0 0 0 0  PHQ - 2 Score 0 0 0 0 0  Altered sleeping - - - - -  Tired, decreased energy - - - - -  Change in appetite - - - - -  Feeling bad or failure about yourself  - - - - -  Trouble concentrating - - - - -  Moving slowly or fidgety/restless - - - - -  Suicidal thoughts - - - - -  PHQ-9 Score - - - - -  Difficult doing work/chores - - - - -     Relevant past medical, surgical, family, and social history reviewed and updated as indicated.  Allergies and medications reviewed and updated.   Past Medical History:  Diagnosis Date  . Anxiety   . Arthritis    fingers, left foot  . C. difficile diarrhea 2015  . COPD (chronic obstructive pulmonary disease) ([place])   . Depression   . Eczema   . H pylori ulcer   . Headache(784.0)   . Hypercholesterolemia   .  Kidney stones    20 years ago  . Pneumonia    5 years ago  . Seizures ([place])    2 years ago, "cluster of seizures" none since  . Shingles     Past Surgical History:  Procedure Laterality Date  . ABDOMINAL HYSTERECTOMY    . ADENOIDECTOMY    . APPENDECTOMY    . CESAREAN SECTION    . CHOLECYSTECTOMY    . COLONOSCOPY  06/10/2012   RMR: Colonic polyps -removed as described above.   Marland Kitchen KNEE ARTHROSCOPY  1973   right knee,  torn cart  . LUMBAR DISC SURGERY  05/12/2015   L4 L5  . LUMBAR LAMINECTOMY/DECOMPRESSION MICRODISCECTOMY Left 05/12/2015   Procedure: Left L4-5 Microdiscectomy;  Surgeon: Marybelle Killings, MD;  Location: [facility];  Service: Orthopedics;  Laterality: Left;   . TONSILLECTOMY      Social History   Socioeconomic History  . Marital status: Married    Spouse name: [name]   . Number of children: 3  . Years of education: 12+  . Highest education level: Not on file  Occupational History    Employer: [employer]  . Financial resource strain: Not on file  . Food insecurity    Worry: Not on file    Inability: Not on file  . Transportation needs    Medical: Not on file    Non-medical: Not on file  Tobacco Use  . Smoking status: Former Smoker    Packs/day: 0.50    Years: 30.00    Pack years: 15.00    Types: Cigarettes    Quit date: 12/14/2017    Years since quitting: 1.7  . Smokeless tobacco: Never Used  . Tobacco comment: 5-10 cigarettes a day (plans to quit prior to this surgery 05/06/15)  Substance and Sexual Activity  . Alcohol use: Yes    Alcohol/week: 21.0 standard drinks    Types: 21 Shots of liquor per week    Comment: 3 per day  . Drug use: No  . Sexual activity: Not on file  Lifestyle  . Physical activity    Days per week: Not on file    Minutes per session: Not on file  . Stress: Not on file  Relationships  . Social Herbalist on phone: Not on file    Gets together: Not on file    Attends religious service: Not on file    Active member of club or organization: Not on file    Attends meetings of clubs or organizations: Not on file    Relationship status: Not on file  . Intimate partner violence    Fear of current or ex partner: Not on file    Emotionally abused: Not on file    Physically abused: Not on file    Forced sexual activity: Not on file  Other Topics Concern  . Not on file  Social History Narrative   The patient lives at home with husband [name].    Patient has 3 children.    Patient has he Associates Degree.    Patient is currently working.     Outpatient Encounter Medications as of 08/28/2019  Medication Sig  . azelastine (ASTELIN) 0.1 % nasal spray Place  1-2 sprays into both nostrils 2 (two) times daily as needed for rhinitis.  Marland Kitchen cetirizine (ZYRTEC) 10 MG tablet Take 10 mg by mouth at bedtime.   . dicyclomine (BENTYL) 10 MG capsule Take 1 capsule (10  mg total) by mouth 2 (two) times daily.  Marland Kitchen EPINEPHrine (AUVI-Q) 0.3 mg/0.3 mL IJ SOAJ injection Inject 0.3 mLs (0.3 mg total) into the muscle as needed for anaphylaxis.  Marland Kitchen FLUoxetine (PROZAC) 20 MG capsule Take 1 capsule (20 mg total) by mouth daily. (Needs to be seen before next refill)  . fluticasone (FLONASE) 50 MCG/ACT nasal spray Place 2 sprays into both nostrils daily.  . Olopatadine HCl (PATADAY) 0.2 % SOLN Place 1 drop into both eyes daily as needed.  Marland Kitchen omeprazole (PRILOSEC) 20 MG capsule Take 1 capsule (20 mg total) by mouth 2 (two) times daily before a meal.  . triamcinolone cream (KENALOG) 0.1 % Apply 1 application topically 2 (two) times daily.  Marland Kitchen VITAMIN E PO Take 1 capsule by mouth daily.  Marland Kitchen zolpidem (AMBIEN) 10 MG tablet Take 1 tablet (10 mg total) by mouth at bedtime as needed for sleep.  . [DISCONTINUED] FLUoxetine (PROZAC) 20 MG capsule Take 1 capsule (20 mg total) by mouth daily. (Needs to be seen before next refill)  . [DISCONTINUED] zolpidem (AMBIEN) 10 MG tablet Take 1 tablet (10 mg total) by mouth at bedtime as needed for sleep.   No facility-administered encounter medications on file as of 08/28/2019.     Allergies  Allergen Reactions  . Penicillins     Unknown type of reaction  . Keppra [Levetiracetam]     irritability    Review of Systems  Constitutional: Negative for activity change, appetite change, chills, diaphoresis, fatigue, fever and unexpected weight change.  HENT: Negative.   Eyes: Negative.  Negative for photophobia and visual disturbance.  Respiratory: Negative for cough, chest tightness and shortness of breath.   Cardiovascular: Negative for chest pain, palpitations and leg swelling.  Gastrointestinal: Negative for abdominal pain, blood in stool,  constipation, diarrhea, nausea and vomiting.  Endocrine: Negative.   Genitourinary: Negative for decreased urine volume, difficulty urinating, dysuria, frequency and urgency.  Musculoskeletal: Negative for arthralgias and myalgias.  Skin: Negative.   Allergic/Immunologic: Negative.   Neurological: Negative for dizziness, tremors, seizures, syncope, facial asymmetry, speech difficulty, weakness, light-headedness, numbness and headaches.  Hematological: Negative.   Psychiatric/Behavioral: Positive for sleep disturbance. Negative for agitation, behavioral problems, confusion, decreased concentration, dysphoric mood, hallucinations, self-injury and suicidal ideas. The patient is nervous/anxious. The patient is not hyperactive.   All other systems reviewed and are negative.        Observations/Objective: No vital signs or physical exam, this was a telephone or virtual health encounter.  Pt alert and oriented, answers all questions appropriately, and able to speak in full sentences.    Assessment and Plan: Sha was seen today for medical management of chronic issues.  Diagnoses and all orders for this visit:  Generalized anxiety disorder Mood disorder ([place]) Doing very well with below, will continue.  -     FLUoxetine (PROZAC) 20 MG capsule; Take 1 capsule (20 mg total) by mouth daily. (Needs to be seen before next refill)  Insomnia due to medical condition Doing very well with below. Will continue. Sleep hygiene discussed in detail.  -     zolpidem (AMBIEN) 10 MG tablet; Take 1 tablet (10 mg total) by mouth at bedtime as needed for sleep.  Pt will schedule an appointment for CPE with labs once pandemic improves or resolves.    Follow Up Instructions: Return in about 3 months (around 11/28/2019), or if symptoms worsen or fail to improve, for CPE with labs.    I discussed the assessment and treatment  plan with the patient. The patient was provided an opportunity to ask questions  and all were answered. The patient agreed with the plan and demonstrated an understanding of the instructions.   The patient was advised to call back or seek an in-person evaluation if the symptoms worsen or if the condition fails to improve as anticipated.  The above assessment and management plan was discussed with the patient. The patient verbalized understanding of and has agreed to the management plan. Patient is aware to call the clinic if they develop any new symptoms or if symptoms persist or worsen. Patient is aware when to return to the clinic for a follow-up visit. Patient educated on when it is appropriate to go to the emergency department.   Due to the nation being under [facility] of Emergency, medication is being electronically prescribed without the patient being seen in the office. Telephone contact has been made with the patient and a follow up appointment will be made. The DEA COVID 19 Policy is attached below.  Electronic Prescribing of Controlled Substance (EPCS) DEA Policy: Questions and Answers for Prescribing Practitioners (EPCS) DEA Guidance: Use of Mobile Devices in the [place] of EPCS Telemedicine On November 15, 2018, the Network engineer of the Department of Health and Financial controller issues a public health emergency ([place] Emergency Declaration). Question: Can telemedicine now be used under the conditions outlined in Title 21, [place] States Code (U.S.C.), Section 802(54)(D)? Answer: Yes While a prescription for a controlled substance issued by means of the Internet (including telemedicine) must generally be predicated on an in-person medical evaluation (21 U.S.C. 829(e)), the Controlled Substances Act contains certain exceptions to this requirement. One such exception occurs when the Secretary of [place] has declared a public health emergency under 42 U.S.C. 712 516 5845 (section 425 of the [place]), as set forth in 21 U.S.C.  802(54)(D). Secretary Andee Poles declared such a public health emergency with regard to COVID-19 on November 15, 2018. ([email or web address]://[email or web address].[email or web address]). For as long as the [employer] of a public health emergency remains in effect, DEA-registered practitioners may issue prescriptions for controlled substances to patients for whom they have not conducted an in-person medical evaluation, provided all of the following conditions are met: Marland Kitchen The prescription is issued for a legitimate medical purpose by a practitioner acting in the usual course of his/her professional practice  . The telemedicine communication is conducted using an audio-visual, real-time, two-way interactive communication system.  . The practitioner is acting in accordance with applicable Federal and State law. Provided the practitioner satisfies the above requirements, the practitioner may issue the prescription using any of the methods of prescribing currently available and in the manner set forth in the [facility] regulations. Thus, the practitioner may issue a prescription either electronically (for schedules II-V) or by calling in an emergency schedule II prescription to the pharmacy, or by calling in a schedule III-V prescription to the pharmacy. Important note: If the prescribing practitioner has previously conducted an in-person medical evaluation of the patient, the practitioner may issue a prescription for a controlled substance after having communicated with the patient via telemedicine, or any other means, regardless of whether a public health emergency has been declared by the [place], so long as the prescription is issued for a legitimate medical purpose and the practitioner is acting in the usual course of his/her professional practice. In addition, for the prescription to be valid, the practitioner must comply with  any applicable  State laws.   I provided 25 minutes of non-face-to-face time during this encounter. The call started at 1000. The call ended at 1025. The other time was used for coordination of care.    Monia Pouch, FNP-C [facility] [address] [place], [place] [address] [phone] 08/28/2019

## 2019-08-29 ENCOUNTER — Encounter: Payer: Self-pay | Admitting: Family Medicine

## 2019-09-09 ENCOUNTER — Other Ambulatory Visit: Payer: Self-pay | Admitting: Family Medicine

## 2019-09-09 DIAGNOSIS — K219 Gastro-esophageal reflux disease without esophagitis: Secondary | ICD-10-CM

## 2019-09-25 ENCOUNTER — Other Ambulatory Visit: Payer: Self-pay | Admitting: Family Medicine

## 2019-09-25 DIAGNOSIS — F39 Unspecified mood [affective] disorder: Secondary | ICD-10-CM

## 2019-09-25 DIAGNOSIS — F411 Generalized anxiety disorder: Secondary | ICD-10-CM

## 2019-11-24 ENCOUNTER — Encounter: Payer: Self-pay | Admitting: Family Medicine

## 2019-11-24 ENCOUNTER — Ambulatory Visit (INDEPENDENT_AMBULATORY_CARE_PROVIDER_SITE_OTHER): Payer: 59 | Admitting: Family Medicine

## 2019-11-24 DIAGNOSIS — L282 Other prurigo: Secondary | ICD-10-CM

## 2019-11-24 DIAGNOSIS — B353 Tinea pedis: Secondary | ICD-10-CM

## 2019-11-24 MED ORDER — BETAMETHASONE DIPROPIONATE 0.05 % EX CREA: 1.0000 "application " | TOPICAL_CREAM | Freq: Two times a day (BID) | CUTANEOUS | 3 refills | Status: DC

## 2019-11-24 MED ORDER — FAMOTIDINE 20 MG PO TABS: 20.0000 mg | ORAL_TABLET | Freq: Two times a day (BID) | ORAL | 0 refills | Status: DC

## 2019-11-24 MED ORDER — CLOTRIMAZOLE 1 % EX CREA: 1.0000 "application " | TOPICAL_CREAM | Freq: Two times a day (BID) | CUTANEOUS | 0 refills | Status: DC

## 2019-11-24 MED ORDER — PREDNISONE 20 MG PO TABS: ORAL_TABLET | ORAL | 0 refills | Status: DC

## 2019-11-24 NOTE — Progress Notes (Signed)
Virtual Visit via telephone Note Due to COVID-19 pandemic this visit was conducted virtually. This visit type was conducted due to national recommendations for restrictions regarding the COVID-19 Pandemic (e.g. social distancing, sheltering in place) in an effort to limit this patient's exposure and mitigate transmission in our community. All issues noted in this document were discussed and addressed.  A physical exam was not performed with this format.   I connected with Diane [name] on 11/24/2019 at 1245 by telephone and verified that I am speaking with the correct person using two identifiers. Diane [name] is currently located at home and family is currently with them during visit. The provider, Monia Pouch, FNP is located in their office at time of visit.  I discussed the limitations, risks, security and privacy concerns of performing an evaluation and management service by telephone and the availability of in person appointments. I also discussed with the patient that there may be a patient responsible charge related to this service. The patient expressed understanding and agreed to proceed.  Subjective:  Patient ID: Diane [name], female    DOB: Feb 19, 1956, 64 y.o.   MRN: [id number]  Chief Complaint:  Rash   HPI: Diane [name] is a 64 y.o. female presenting on 11/24/2019 for Rash   Worsening pruritic rash to bilateral legs, shoulders, and upper back. States she has now developed a pruritic painful rash to her feet with fissures between her toes.   Rash This is a chronic problem. The current episode started more than 1 month ago. The problem has been waxing and waning since onset. The affected locations include the left lower leg, right lower leg, right shoulder, left shoulder and torso. The rash is characterized by dryness, itchiness, redness and scaling. She was exposed to nothing. Pertinent negatives include no anorexia, congestion, cough, diarrhea, eye  pain, facial edema, fatigue, fever, joint pain, nail changes, rhinorrhea, shortness of breath, sore throat or vomiting. Past treatments include anti-itch cream, antihistamine, cold compress, moisturizer, topical steroids and oral steroids. The treatment provided mild relief.     Relevant past medical, surgical, family, and social history reviewed and updated as indicated.  Allergies and medications reviewed and updated.   Past Medical History:  Diagnosis Date  . Anxiety   . Arthritis    fingers, left foot  . C. difficile diarrhea 2015  . COPD (chronic obstructive pulmonary disease) ([place])   . Depression   . Eczema   . H pylori ulcer   . Headache(784.0)   . Hypercholesterolemia   . Kidney stones    20 years ago  . Pneumonia    5 years ago  . Seizures ([place])    2 years ago, "cluster of seizures" none since  . Shingles     Past Surgical History:  Procedure Laterality Date  . ABDOMINAL HYSTERECTOMY    . ADENOIDECTOMY    . APPENDECTOMY    . CESAREAN SECTION    . CHOLECYSTECTOMY    . COLONOSCOPY  06/10/2012   RMR: Colonic polyps -removed as described above.   Marland Kitchen KNEE ARTHROSCOPY  1973   right knee,  torn cart  . LUMBAR DISC SURGERY  05/12/2015   L4 L5  . LUMBAR LAMINECTOMY/DECOMPRESSION MICRODISCECTOMY Left 05/12/2015   Procedure: Left L4-5 Microdiscectomy;  Surgeon: Marybelle Killings, MD;  Location: [facility];  Service: Orthopedics;  Laterality: Left;  . TONSILLECTOMY      Social History   Socioeconomic History  . Marital status: Married    Spouse name: [name]   .  Number of children: 3  . Years of education: 12+  . Highest education level: Not on file  Occupational History    Employer: [employer]  Tobacco Use  . Smoking status: Former Smoker    Packs/day: 0.50    Years: 30.00    Pack years: 15.00    Types: Cigarettes    Quit date: 12/14/2017    Years since quitting: 1.9  . Smokeless tobacco: Never Used  . Tobacco comment: 5-10 cigarettes a day (plans to quit  prior to this surgery 05/06/15)  Substance and Sexual Activity  . Alcohol use: Yes    Alcohol/week: 21.0 standard drinks    Types: 21 Shots of liquor per week    Comment: 3 per day  . Drug use: No  . Sexual activity: Not on file  Other Topics Concern  . Not on file  Social History Narrative   The patient lives at home with husband [name].    Patient has 3 children.    Patient has he Associates Degree.    Patient is currently working.    Social Determinants of Health   Financial Resource Strain:   . Difficulty of Paying Living Expenses: Not on file  Food Insecurity:   . Worried About Charity fundraiser in the Last Year: Not on file  . Ran Out of Food in the Last Year: Not on file  Transportation Needs:   . Lack of Transportation (Medical): Not on file  . Lack of Transportation (Non-Medical): Not on file  Physical Activity:   . Days of Exercise per Week: Not on file  . Minutes of Exercise per Session: Not on file  Stress:   . Feeling of Stress : Not on file  Social Connections:   . Frequency of Communication with Friends and Family: Not on file  . Frequency of Social Gatherings with Friends and Family: Not on file  . Attends Religious Services: Not on file  . Active Member of Clubs or Organizations: Not on file  . Attends Archivist Meetings: Not on file  . Marital Status: Not on file  Intimate Partner Violence:   . Fear of Current or Ex-Partner: Not on file  . Emotionally Abused: Not on file  . Physically Abused: Not on file  . Sexually Abused: Not on file    Outpatient Encounter Medications as of 11/24/2019  Medication Sig  . azelastine (ASTELIN) 0.1 % nasal spray Place 1-2 sprays into both nostrils 2 (two) times daily as needed for rhinitis.  Marland Kitchen betamethasone dipropionate 0.05 % cream Apply 1 application topically 2 (two) times daily.  . cetirizine (ZYRTEC) 10 MG tablet Take 10 mg by mouth at bedtime.   . clotrimazole (LOTRIMIN) 1 % cream Apply 1  application topically 2 (two) times daily.  Marland Kitchen dicyclomine (BENTYL) 10 MG capsule Take 1 capsule (10 mg total) by mouth 2 (two) times daily.  Marland Kitchen EPINEPHrine (AUVI-Q) 0.3 mg/0.3 mL IJ SOAJ injection Inject 0.3 mLs (0.3 mg total) into the muscle as needed for anaphylaxis.  . famotidine (PEPCID) 20 MG tablet Take 1 tablet (20 mg total) by mouth 2 (two) times daily.  Marland Kitchen FLUoxetine (PROZAC) 20 MG capsule Take 1 capsule (20 mg total) by mouth daily.  . fluticasone (FLONASE) 50 MCG/ACT nasal spray Place 2 sprays into both nostrils daily.  . Olopatadine HCl (PATADAY) 0.2 % SOLN Place 1 drop into both eyes daily as needed.  Marland Kitchen omeprazole (PRILOSEC) 20 MG capsule TAKE ONE CAPSULE BY MOUTH  TWICE A DAY  . triamcinolone cream (KENALOG) 0.1 % Apply 1 application topically 2 (two) times daily.  Marland Kitchen VITAMIN E PO Take 1 capsule by mouth daily.  Marland Kitchen zolpidem (AMBIEN) 10 MG tablet Take 1 tablet (10 mg total) by mouth at bedtime as needed for sleep.  . [DISCONTINUED] betamethasone dipropionate 0.05 % cream Apply 1 application topically 2 (two) times daily.   No facility-administered encounter medications on file as of 11/24/2019.    Allergies  Allergen Reactions  . Penicillins     Unknown type of reaction  . Keppra [Levetiracetam]     irritability    Review of Systems  Constitutional: Negative for activity change, appetite change, chills, diaphoresis, fatigue, fever and unexpected weight change.  HENT: Negative.  Negative for congestion, rhinorrhea and sore throat.   Eyes: Negative.  Negative for pain.  Respiratory: Negative for cough, chest tightness and shortness of breath.   Cardiovascular: Negative for chest pain, palpitations and leg swelling.  Gastrointestinal: Negative for abdominal pain, anorexia, blood in stool, constipation, diarrhea, nausea and vomiting.  Endocrine: Negative.   Genitourinary: Negative for decreased urine volume, difficulty urinating, dysuria, frequency and urgency.  Musculoskeletal:  Negative for arthralgias, joint pain and myalgias.  Skin: Positive for color change and rash. Negative for nail changes.  Allergic/Immunologic: Negative.   Neurological: Negative for dizziness and headaches.  Hematological: Negative.   Psychiatric/Behavioral: Negative for confusion, hallucinations, sleep disturbance and suicidal ideas.  All other systems reviewed and are negative.        Observations/Objective: No vital signs or physical exam, this was a telephone or virtual health encounter.  Pt alert and oriented, answers all questions appropriately, and able to speak in full sentences.    Assessment and Plan: [place] was seen today for rash.  Diagnoses and all orders for this visit:  Pruritic rash Ongoing and worsening symptoms despite eliminating mammal products after testing positive for Alpha-Gal. Pt instructed to try a gluten free diet to see if beneficial. Medications as prescribed. Report any new, worsening, or persistent symptoms.  -     famotidine (PEPCID) 20 MG tablet; Take 1 tablet (20 mg total) by mouth 2 (two) times daily. -     betamethasone dipropionate 0.05 % cream; Apply 1 application topically 2 (two) times daily.  Tinea pedis of both feet Symptomatic care discussed in detail. Medications as prescribed. Report any new or worsening symptoms.  -     clotrimazole (LOTRIMIN) 1 % cream; Apply 1 application topically 2 (two) times daily.     Follow Up Instructions: Return in about 2 weeks (around 12/08/2019), or if symptoms worsen or fail to improve.    I discussed the assessment and treatment plan with the patient. The patient was provided an opportunity to ask questions and all were answered. The patient agreed with the plan and demonstrated an understanding of the instructions.   The patient was advised to call back or seek an in-person evaluation if the symptoms worsen or if the condition fails to improve as anticipated.  The above assessment and management  plan was discussed with the patient. The patient verbalized understanding of and has agreed to the management plan. Patient is aware to call the clinic if they develop any new symptoms or if symptoms persist or worsen. Patient is aware when to return to the clinic for a follow-up visit. Patient educated on when it is appropriate to go to the emergency department.    I provided 15 minutes of non-face-to-face time during  this encounter. The call started at 1245. The call ended at 1300. The other time was used for coordination of care.    Monia Pouch, FNP-C [facility] [address] [place],  [address] [phone] 11/24/2019

## 2019-11-26 ENCOUNTER — Other Ambulatory Visit: Payer: Self-pay | Admitting: Family Medicine

## 2019-11-26 DIAGNOSIS — F411 Generalized anxiety disorder: Secondary | ICD-10-CM

## 2019-11-26 DIAGNOSIS — F39 Unspecified mood [affective] disorder: Secondary | ICD-10-CM

## 2019-12-02 ENCOUNTER — Other Ambulatory Visit: Payer: Self-pay

## 2019-12-03 ENCOUNTER — Encounter: Payer: Self-pay | Admitting: Family Medicine

## 2019-12-03 ENCOUNTER — Ambulatory Visit (INDEPENDENT_AMBULATORY_CARE_PROVIDER_SITE_OTHER): Payer: 59 | Admitting: Family Medicine

## 2019-12-03 ENCOUNTER — Other Ambulatory Visit: Payer: Self-pay

## 2019-12-03 VITALS — BP 114/76 | HR 72 | Temp 97.1°F | Resp 20 | Ht 65.0 in | Wt 145.0 lb

## 2019-12-03 DIAGNOSIS — F1090 Alcohol use, unspecified, uncomplicated: Secondary | ICD-10-CM

## 2019-12-03 DIAGNOSIS — L298 Other pruritus: Secondary | ICD-10-CM | POA: Diagnosis not present

## 2019-12-03 DIAGNOSIS — Z7289 Other problems related to lifestyle: Secondary | ICD-10-CM | POA: Diagnosis not present

## 2019-12-03 DIAGNOSIS — R1013 Epigastric pain: Secondary | ICD-10-CM | POA: Diagnosis not present

## 2019-12-03 DIAGNOSIS — E782 Mixed hyperlipidemia: Secondary | ICD-10-CM

## 2019-12-03 DIAGNOSIS — Z789 Other specified health status: Secondary | ICD-10-CM

## 2019-12-03 DIAGNOSIS — K219 Gastro-esophageal reflux disease without esophagitis: Secondary | ICD-10-CM | POA: Diagnosis not present

## 2019-12-03 DIAGNOSIS — L2989 Other pruritus: Secondary | ICD-10-CM

## 2019-12-03 MED ORDER — BETAMETHASONE DIPROPIONATE AUG 0.05 % EX OINT: TOPICAL_OINTMENT | Freq: Two times a day (BID) | CUTANEOUS | 0 refills | Status: DC

## 2019-12-03 NOTE — Patient Instructions (Signed)
Psoriasis Psoriasis is a long-term (chronic) skin condition. It occurs because your body's defense system (immune system) causes skin cells to form too quickly. This causes raised, red patches (plaques) on your skin that look silvery. The patches may be on all areas of your body. They can be any size or shape. Psoriasis can come and go. It can range from mild to very bad. It cannot be passed from one person to another (is not contagious). There is no cure for this condition, but it can be helped with treatment. What are the causes? The cause of psoriasis is not known. Some things can make it worse. These are:  Skin damage, such as cuts, scrapes, sunburn, and dryness.  Not getting enough sunlight.  Some medicines.  Alcohol.  Tobacco.  Stress.  Infections. What increases the risk?  Having a family member with psoriasis.  Being very overweight (obese).  Being 64 years old.  Taking certain medicines. What are the signs or symptoms? There are different types of psoriasis. The types are:  Plaque. This is the most common. Symptoms include red, raised patches with a silvery coating. These may be itchy. Your nails may be crumbly or fall off.  Guttate. Symptoms include small red spots on your stomach area, arms, and legs. These may happen after you have been sick, such as with strep throat.  Inverse. Symptoms include patches in your armpits, under your breasts, private areas, or on your butt.  Pustular. Symptoms include pus-filled bumps on the palms of your hands or the soles of your feet. You also may feel very tired, weak, have a fever, and not be hungry.  Erythrodermic. Symptoms include bright red skin that looks burned. You may have a fast heartbeat and a body temperature that is too high or too low. You may be itchy or in pain.  Sebopsoriasis. Symptoms include red patches on your scalp, forehead, and face that are greasy.  Psoriatic arthritis. Symptoms include swollen,  painful joints along with scaly skin patches. How is this treated? There is no cure for this condition, but treatment can:  Help your skin heal.  Lessen itching and irritation and swelling (inflammation).  Slow the growth of new skin cells.  Help your body's defense system respond better to your skin. Treatment may include:  Creams or ointments.  Light therapy. This may include natural sunlight or light therapy in a doctor's office.  Medicines. These can help your body better manage skin cells. They may be used with light therapy or ointments. Medicines may include pills or injections. You may also get antibiotic medicines if you have an infection. Follow these instructions at home: Skin Care  Apply lotion to your skin as needed. Only use those that your doctor has said are okay.  Apply cool, wet cloths (cold compresses) to the affected areas.  Do not use a hot tub or take hot showers. Use slightly warm, not hot, water when taking showers and baths.  Do not scratch your skin. Lifestyle   Do not use any products that contain nicotine or tobacco, such as cigarettes, e-cigarettes, and chewing tobacco. If you need help quitting, ask your doctor.  Lower your stress.  Keep a healthy weight.  Go out in the sun as told by your doctor. Do not get sunburned.  Join a support group. Medicines  Take or use over-the-counter and prescription medicines only as told by your doctor.  If you were prescribed an antibiotic medicine, take it as told by your doctor.  Do not stop using the antibiotic even if you start to feel better. Alcohol use If you drink alcohol:  Limit how much you use: ? 0-1 drink a day for women. ? 0-2 drinks a day for men.  Be aware of how much alcohol is in your drink. In the [place] one drink equals one 12 oz bottle of beer (355 mL), one 5 oz glass of wine (148 mL), or one 1 oz glass of hard liquor (44 mL). General instructions  Keep a journal to track the  things that cause symptoms (triggers). Try to avoid these things.  See a counselor if you feel the support would help.  Keep all follow-up visits as told by your doctor. This is important. Contact a doctor if:  You have a fever.  Your pain gets worse.  You have more redness or warmth in the affected areas.  You have new or worse pain or stiffness in your joints.  Your nails start to break easily or pull away from the nail bed.  You feel very sad (depressed). Summary  Psoriasis is a long-term (chronic) skin condition.  There is no cure for this condition, but treatment can help manage it.  Keep a journal to track the things that cause symptoms.  Take or use over-the-counter and prescription medicines only as told by your doctor.  Keep all follow-up visits as told by your doctor. This is important. This information is not intended to replace advice given to you by your health care provider. Make sure you discuss any questions you have with your health care provider. Document Revised: 08/06/2018 Document Reviewed: 08/06/2018 Elsevier Patient Education  2020 [employer].

## 2019-12-03 NOTE — Progress Notes (Signed)
Subjective:  Patient ID: Diane [name], female    DOB: May 30, 1956, 64 y.o.   MRN: [id number]  Patient Care Team: Baruch Gouty, FNP as PCP - General (Family Medicine) Gala Romney Cristopher Estimable, MD as Consulting Physician (Gastroenterology)   Chief Complaint:  Medical Management of Chronic Issues and Hypertension   HPI: Diane [name] is a 64 y.o. female presenting on 12/03/2019 for Medical Management of Chronic Issues and Hypertension   1. Mixed hyperlipidemia Diet controlled. Pt is active on a regular basis but does not exercise. She does try to watch what she eats but does not follow a strict diet.   2. Gastroesophageal reflux disease without esophagitis Compliant with medications - Yes Current medications - omeprazole Adverse side effects - No Cough - No Sore throat - No Voice change - No Hemoptysis - No Dysphagia or dyspepsia - No Water brash - No Red Flags (weight loss, hematochezia, melena, weight loss, early satiety, fevers, odynophagia, or persistent vomiting) - No   3. Alcohol use States she was drinking very heavy for several months. States she has stopped drinking completely over the last 2 weeks. States she feels she is doing ok since cessation. States she does have intermittent epigastric pain. States she pain is dull and aching in nature and seems to come and go without known cause. No chest pain, shortness of breath, jaw pain, arm pain, neck pain, diaphoresis, or nausea. Does report feeling fatigued and malaise at times.   4. Pruritic erythematous rash Ongoing and worsening pruritic rash to bilateral hands and feet. Slight rash to lower legs. Has been using steroids cream as prescribed without completion of symptoms.    Relevant past medical, surgical, family, and social history reviewed and updated as indicated.  Allergies and medications reviewed and updated. Date reviewed: Chart in Epic.   Past Medical History:  Diagnosis Date  . Anxiety   .  Arthritis    fingers, left foot  . C. difficile diarrhea 2015  . COPD (chronic obstructive pulmonary disease) ([place])   . Depression   . Eczema   . H pylori ulcer   . Headache(784.0)   . Hypercholesterolemia   . Kidney stones    20 years ago  . Pneumonia    5 years ago  . Seizures ([place])    2 years ago, "cluster of seizures" none since  . Shingles     Past Surgical History:  Procedure Laterality Date  . ABDOMINAL HYSTERECTOMY    . ADENOIDECTOMY    . APPENDECTOMY    . CESAREAN SECTION    . CHOLECYSTECTOMY    . COLONOSCOPY  06/10/2012   RMR: Colonic polyps -removed as described above.   Marland Kitchen KNEE ARTHROSCOPY  1973   right knee,  torn cart  . LUMBAR DISC SURGERY  05/12/2015   L4 L5  . LUMBAR LAMINECTOMY/DECOMPRESSION MICRODISCECTOMY Left 05/12/2015   Procedure: Left L4-5 Microdiscectomy;  Surgeon: Marybelle Killings, MD;  Location: [facility];  Service: Orthopedics;  Laterality: Left;  . TONSILLECTOMY      Social History   Socioeconomic History  . Marital status: Married    Spouse name: [name]   . Number of children: 3  . Years of education: 12+  . Highest education level: Not on file  Occupational History    Employer: [employer]  Tobacco Use  . Smoking status: Former Smoker    Packs/day: 0.50    Years: 30.00    Pack years: 15.00    Types:  Cigarettes    Quit date: 12/14/2017    Years since quitting: 1.9  . Smokeless tobacco: Never Used  . Tobacco comment: 5-10 cigarettes a day (plans to quit prior to this surgery 05/06/15)  Substance and Sexual Activity  . Alcohol use: Yes    Alcohol/week: 21.0 standard drinks    Types: 21 Shots of liquor per week    Comment: 3 per day  . Drug use: No  . Sexual activity: Not on file  Other Topics Concern  . Not on file  Social History Narrative   The patient lives at home with husband [name].    Patient has 3 children.    Patient has he Associates Degree.    Patient is currently working.    Social Determinants of Health     Financial Resource Strain:   . Difficulty of Paying Living Expenses: Not on file  Food Insecurity:   . Worried About Charity fundraiser in the Last Year: Not on file  . Ran Out of Food in the Last Year: Not on file  Transportation Needs:   . Lack of Transportation (Medical): Not on file  . Lack of Transportation (Non-Medical): Not on file  Physical Activity:   . Days of Exercise per Week: Not on file  . Minutes of Exercise per Session: Not on file  Stress:   . Feeling of Stress : Not on file  Social Connections:   . Frequency of Communication with Friends and Family: Not on file  . Frequency of Social Gatherings with Friends and Family: Not on file  . Attends Religious Services: Not on file  . Active Member of Clubs or Organizations: Not on file  . Attends Archivist Meetings: Not on file  . Marital Status: Not on file  Intimate Partner Violence:   . Fear of Current or Ex-Partner: Not on file  . Emotionally Abused: Not on file  . Physically Abused: Not on file  . Sexually Abused: Not on file    Outpatient Encounter Medications as of 12/03/2019  Medication Sig  . azelastine (ASTELIN) 0.1 % nasal spray Place 1-2 sprays into both nostrils 2 (two) times daily as needed for rhinitis.  Marland Kitchen betamethasone dipropionate 0.05 % cream Apply 1 application topically 2 (two) times daily.  . cetirizine (ZYRTEC) 10 MG tablet Take 10 mg by mouth at bedtime.   . clotrimazole (LOTRIMIN) 1 % cream Apply 1 application topically 2 (two) times daily.  . famotidine (PEPCID) 20 MG tablet Take 1 tablet (20 mg total) by mouth 2 (two) times daily.  Marland Kitchen FLUoxetine (PROZAC) 20 MG capsule TAKE ONE (1) CAPSULE EACH DAY  . fluticasone (FLONASE) 50 MCG/ACT nasal spray Place 2 sprays into both nostrils daily.  Marland Kitchen omeprazole (PRILOSEC) 20 MG capsule TAKE ONE CAPSULE BY MOUTH TWICE A DAY  . VITAMIN E PO Take 1 capsule by mouth daily.  Marland Kitchen zolpidem (AMBIEN) 10 MG tablet Take 1 tablet (10 mg total) by mouth at  bedtime as needed for sleep.  Marland Kitchen augmented betamethasone dipropionate (DIPROLENE) 0.05 % ointment Apply topically 2 (two) times daily.  Marland Kitchen dicyclomine (BENTYL) 10 MG capsule Take 1 capsule (10 mg total) by mouth 2 (two) times daily. (Patient not taking: Reported on 12/03/2019)  . EPINEPHrine (AUVI-Q) 0.3 mg/0.3 mL IJ SOAJ injection Inject 0.3 mLs (0.3 mg total) into the muscle as needed for anaphylaxis. (Patient not taking: Reported on 12/03/2019)  . Olopatadine HCl (PATADAY) 0.2 % SOLN Place 1 drop into both  eyes daily as needed. (Patient not taking: Reported on 12/03/2019)  . [DISCONTINUED] predniSONE (DELTASONE) 20 MG tablet 2 po at sametime daily for 5 days  . [DISCONTINUED] triamcinolone cream (KENALOG) 0.1 % Apply 1 application topically 2 (two) times daily.   No facility-administered encounter medications on file as of 12/03/2019.    Allergies  Allergen Reactions  . Penicillins     Unknown type of reaction  . Keppra [Levetiracetam]     irritability    Review of Systems  Constitutional: Negative for activity change, appetite change, chills, diaphoresis, fatigue, fever and unexpected weight change.  HENT: Negative.   Eyes: Negative.  Negative for photophobia and visual disturbance.  Respiratory: Negative for cough, chest tightness and shortness of breath.   Cardiovascular: Negative for chest pain, palpitations and leg swelling.  Gastrointestinal: Positive for abdominal pain (intermittent epigastric pain). Negative for abdominal distention, anal bleeding, blood in stool, constipation, diarrhea, nausea, rectal pain and vomiting.  Endocrine: Negative.  Negative for cold intolerance, heat intolerance, polydipsia, polyphagia and polyuria.  Genitourinary: Negative for decreased urine volume, difficulty urinating, dysuria, frequency and urgency.  Musculoskeletal: Negative for arthralgias, back pain and myalgias.  Skin: Positive for color change and rash. Negative for pallor and wound.    Allergic/Immunologic: Negative.   Neurological: Negative for dizziness, tremors, seizures, syncope, facial asymmetry, speech difficulty, weakness, light-headedness, numbness and headaches.  Hematological: Negative.   Psychiatric/Behavioral: Positive for decreased concentration, dysphoric mood and sleep disturbance. Negative for agitation, behavioral problems, confusion, hallucinations, self-injury and suicidal ideas. The patient is nervous/anxious.   All other systems reviewed and are negative.       Objective:  BP 114/76   Pulse 72   Temp (!) 97.1 F (36.2 C)   Resp 20   Ht 5' 5"  (1.651 m)   Wt 145 lb (65.8 kg)   SpO2 97%   BMI 24.13 kg/m    Wt Readings from Last 3 Encounters:  12/03/19 145 lb (65.8 kg)  04/29/19 146 lb (66.2 kg)  04/14/19 148 lb (67.1 kg)    Physical Exam Vitals and nursing note reviewed.  Constitutional:      General: She is not in acute distress.    Appearance: Normal appearance. She is well-developed, well-groomed and normal weight. She is not ill-appearing, toxic-appearing or diaphoretic.  HENT:     Head: Normocephalic and atraumatic.     Jaw: There is normal jaw occlusion.     Right Ear: Hearing, tympanic membrane, ear canal and external ear normal.     Left Ear: Hearing, tympanic membrane, ear canal and external ear normal.     Nose: Nose normal.     Mouth/Throat:     Lips: Pink.     Mouth: Mucous membranes are moist.     Pharynx: Oropharynx is clear. Uvula midline.  Eyes:     General: Lids are normal.     Extraocular Movements: Extraocular movements intact.     Conjunctiva/sclera: Conjunctivae normal.     Pupils: Pupils are equal, round, and reactive to light.  Neck:     Thyroid: No thyroid mass, thyromegaly or thyroid tenderness.     Vascular: No carotid bruit or JVD.     Trachea: Trachea and phonation normal.  Cardiovascular:     Rate and Rhythm: Normal rate and regular rhythm.     Chest Wall: PMI is not displaced.     Pulses: Normal  pulses.     Heart sounds: Normal heart sounds. No murmur. No friction rub. No  gallop.   Pulmonary:     Effort: Pulmonary effort is normal. No respiratory distress.     Breath sounds: Normal breath sounds. No wheezing.  Abdominal:     General: Abdomen is flat. Bowel sounds are normal. There is no distension or abdominal bruit.     Palpations: Abdomen is soft. There is no hepatomegaly or splenomegaly.     Tenderness: There is abdominal tenderness (epigastric, no guarding) in the epigastric area. There is no right CVA tenderness, left CVA tenderness or rebound. Negative signs include Murphy's sign, Rovsing's sign, McBurney's sign, psoas sign and obturator sign.     Hernia: No hernia is present.  Musculoskeletal:        General: Normal range of motion.     Cervical back: Normal range of motion and neck supple.     Right lower leg: No edema.     Left lower leg: No edema.  Lymphadenopathy:     Cervical: No cervical adenopathy.  Skin:    General: Skin is warm and dry.     Capillary Refill: Capillary refill takes less than 2 seconds.     Coloration: Skin is not cyanotic, jaundiced or pale.     Findings: Erythema and rash present.     Comments: Erythematous, silver, dry plaques to soles of feet, cracking to left sole. No drainage.  Neurological:     General: No focal deficit present.     Mental Status: She is alert and oriented to person, place, and time.     Cranial Nerves: Cranial nerves are intact. No cranial nerve deficit.     Sensory: Sensation is intact. No sensory deficit.     Motor: Motor function is intact. No weakness.     Coordination: Coordination is intact. Coordination normal.     Gait: Gait is intact. Gait normal.     Deep Tendon Reflexes: Reflexes are normal and symmetric. Reflexes normal.  Psychiatric:        Attention and Perception: Attention and perception normal.        Mood and Affect: Affect normal. Mood is anxious.        Speech: Speech normal.        Behavior:  Behavior normal. Behavior is cooperative.        Thought Content: Thought content normal.        Cognition and Memory: Cognition and memory normal.        Judgment: Judgment normal.     Results for orders placed or performed in visit on 04/29/19  Alpha-Gal Panel  Result Value Ref Range   Beef (Bos spp) IgE 1.94 (H) <0.35 kU/L   Class Interpretation 2    Lamb/Mutton (Ovis spp) IgE 0.22 <0.35 kU/L   Class Interpretation 0/1    Pork (Sus spp) IgE 0.71 (H) <0.35 kU/L   Class Interpretation 2    Alpha Gal IgE* 9.65 (H) <0.10 kU/L       Pertinent labs & imaging results that were available during my care of the patient were reviewed by me and considered in my medical decision making.  Assessment & Plan:  Billye was seen today for medical management of chronic issues and hypertension.  Diagnoses and all orders for this visit:  Mixed hyperlipidemia Diet controlled. Labs pending. Will initiate medications if warranted. Diet and exercise encouraged.  -     CMP14+EGFR -     Lipid panel  Gastroesophageal reflux disease without esophagitis No red flags present. Diet discussed. Avoid fried, spicy, fatty, greasy,  and acidic foods. Avoid caffeine, nicotine, and alcohol. Do not eat 2-3 hours before bedtime and stay upright for at least 1-2 hours after eating. Eat small frequent meals. Avoid NSAID's like motrin and aleve. Medications as prescribed. Report any new or worsening symptoms. Follow up as discussed or sooner if needed.   -     CBC with Differential/Platelet  Alcohol use Pt states she has stopped drinking over the last few weeks. Pt states she still has some fatigue, intermittent epigastric pain, will check below labs for deficiencies, anemia, and elevated liver enzymes.   -     CBC with Differential/Platelet -     CMP14+EGFR -     Thyroid Panel With TSH -     Vitamin B12 -     VITAMIN D 25 Hydroxy (Vit-D Deficiency, Fractures) -     Folate  Pruritic erythematous rash Ongoing  pruritic and plaque-like rash to bilateral soles of feet. Will trial diprolene as below. Will get labs to check for gluten sensitivity. If persistent, will refer to dermatology.  -     Gluten Sensitivity Screen -     augmented betamethasone dipropionate (DIPROLENE) 0.05 % ointment; Apply topically 2 (two) times daily.  Epigastric pain Ongoing intermittent epigastric pain with pruritic rash to bilateral lower and upper extremities. EKG to rule out acute cardiovascular events. Will check for possible gluten intolerance, anemia, abnormal liver function test, or electrolyte imbalances.  -     CBC with Differential/Platelet -     CMP14+EGFR -     Gluten Sensitivity Screen -     EKG 12-Lead  Total time spent with patient 45 minute.  Greater than 50% of encounter spent in coordination of care/counseling.    Continue all other maintenance medications.  Follow up plan: Return in about 4 weeks (around 12/31/2019), or if symptoms worsen or fail to improve, for rash.  Continue healthy lifestyle choices, including diet (rich in fruits, vegetables, and lean proteins, and low in salt and simple carbohydrates) and exercise (at least 30 minutes of moderate physical activity daily).  Educational handout given for psoriasis  The above assessment and management plan was discussed with the patient. The patient verbalized understanding of and has agreed to the management plan. Patient is aware to call the clinic if they develop any new symptoms or if symptoms persist or worsen. Patient is aware when to return to the clinic for a follow-up visit. Patient educated on when it is appropriate to go to the emergency department.   Monia Pouch, FNP-C [facility] [phone]

## 2019-12-08 LAB — THYROID PANEL WITH TSH
Free Thyroxine Index: 1.6 (ref 1.2–4.9)
T3 Uptake Ratio: 28 % (ref 24–39)
T4, Total: 5.6 ug/dL (ref 4.5–12.0)
TSH: 0.97 u[IU]/mL (ref 0.450–4.500)

## 2019-12-08 LAB — CBC WITH DIFFERENTIAL/PLATELET
Basophils Absolute: 0 10*3/uL (ref 0.0–0.2)
Basos: 0 %
EOS (ABSOLUTE): 0.3 10*3/uL (ref 0.0–0.4)
Eos: 4 %
Hematocrit: 37.3 % (ref 34.0–46.6)
Hemoglobin: 13 g/dL (ref 11.1–15.9)
Immature Grans (Abs): 0 10*3/uL (ref 0.0–0.1)
Immature Granulocytes: 0 %
Lymphocytes Absolute: 1.9 10*3/uL (ref 0.7–3.1)
Lymphs: 26 %
MCH: 32.8 pg (ref 26.6–33.0)
MCHC: 34.9 g/dL (ref 31.5–35.7)
MCV: 94 fL (ref 79–97)
Monocytes Absolute: 0.6 10*3/uL (ref 0.1–0.9)
Monocytes: 8 %
Neutrophils Absolute: 4.5 10*3/uL (ref 1.4–7.0)
Neutrophils: 62 %
Platelets: 304 10*3/uL (ref 150–450)
RBC: 3.96 x10E6/uL (ref 3.77–5.28)
RDW: 12.3 % (ref 11.7–15.4)
WBC: 7.2 10*3/uL (ref 3.4–10.8)

## 2019-12-08 LAB — GLUTEN SENSITIVITY SCREEN: tTG/DGP Screen: NEGATIVE

## 2019-12-08 LAB — CMP14+EGFR
ALT: 10 IU/L (ref 0–32)
AST: 16 IU/L (ref 0–40)
Albumin/Globulin Ratio: 1.5 (ref 1.2–2.2)
Albumin: 4.4 g/dL (ref 3.8–4.8)
Alkaline Phosphatase: 66 IU/L (ref 39–117)
BUN/Creatinine Ratio: 22 (ref 12–28)
BUN: 14 mg/dL (ref 8–27)
Bilirubin Total: 0.4 mg/dL (ref 0.0–1.2)
CO2: 24 mmol/L (ref 20–29)
Calcium: 9.5 mg/dL (ref 8.7–10.3)
Chloride: 102 mmol/L (ref 96–106)
Creatinine, Ser: 0.63 mg/dL (ref 0.57–1.00)
GFR calc Af Amer: 111 mL/min/{1.73_m2} (ref >59)
GFR calc non Af Amer: 96 mL/min/{1.73_m2} (ref >59)
Globulin, Total: 2.9 g/dL (ref 1.5–4.5)
Glucose: 98 mg/dL (ref 65–99)
Potassium: 4.2 mmol/L (ref 3.5–5.2)
Sodium: 139 mmol/L (ref 134–144)
Total Protein: 7.3 g/dL (ref 6.0–8.5)

## 2019-12-08 LAB — VITAMIN B12: Vitamin B-12: 266 pg/mL (ref 232–1245)

## 2019-12-08 LAB — LIPID PANEL
Chol/HDL Ratio: 4.4 ratio (ref 0.0–4.4)
Cholesterol, Total: 273 mg/dL — ABNORMAL HIGH (ref 100–199)
HDL: 62 mg/dL (ref >39)
LDL Chol Calc (NIH): 164 mg/dL — ABNORMAL HIGH (ref 0–99)
Triglycerides: 255 mg/dL — ABNORMAL HIGH (ref 0–149)
VLDL Cholesterol Cal: 47 mg/dL — ABNORMAL HIGH (ref 5–40)

## 2019-12-08 LAB — ANTIGLIADIN IGG (NATIVE): Antigliadin IgG (native): 2 units (ref 0–19)

## 2019-12-08 LAB — FOLATE: Folate: 11.7 ng/mL (ref >3.0)

## 2019-12-08 LAB — VITAMIN D 25 HYDROXY (VIT D DEFICIENCY, FRACTURES): Vit D, 25-Hydroxy: 34.9 ng/mL (ref 30.0–100.0)

## 2019-12-08 LAB — F004-IGE WHEAT: Wheat IgE: <0.1 kU/L

## 2019-12-08 LAB — NOTE:

## 2019-12-20 ENCOUNTER — Other Ambulatory Visit: Payer: Self-pay | Admitting: Family Medicine

## 2019-12-20 DIAGNOSIS — F39 Unspecified mood [affective] disorder: Secondary | ICD-10-CM

## 2019-12-20 DIAGNOSIS — F411 Generalized anxiety disorder: Secondary | ICD-10-CM

## 2020-01-22 ENCOUNTER — Other Ambulatory Visit: Payer: Self-pay | Admitting: Family Medicine

## 2020-01-22 DIAGNOSIS — G4701 Insomnia due to medical condition: Secondary | ICD-10-CM

## 2020-01-22 NOTE — Telephone Encounter (Signed)
Last office visit 12/03/2019 Last refill 08/28/2019, #30, 5 refills

## 2020-01-26 ENCOUNTER — Telehealth: Payer: Self-pay | Admitting: Family Medicine

## 2020-01-26 NOTE — Telephone Encounter (Signed)
Patient wants to have cholesterol rechecked.  Patient's last lab work was 12/03/2019 so patient is not yet due to have repeat labs.  Advised patient we could schedule an appointment for her to come back in May.  Patient wants to call back to schedule.

## 2020-02-02 ENCOUNTER — Other Ambulatory Visit: Payer: Self-pay | Admitting: Family Medicine

## 2020-02-02 DIAGNOSIS — F411 Generalized anxiety disorder: Secondary | ICD-10-CM

## 2020-02-02 DIAGNOSIS — F39 Unspecified mood [affective] disorder: Secondary | ICD-10-CM

## 2020-02-06 ENCOUNTER — Telehealth: Payer: Self-pay | Admitting: Family Medicine

## 2020-02-06 NOTE — Telephone Encounter (Signed)
Patient has a follow up appointment scheduled. 

## 2020-02-18 ENCOUNTER — Ambulatory Visit (INDEPENDENT_AMBULATORY_CARE_PROVIDER_SITE_OTHER): Payer: 59 | Admitting: Family Medicine

## 2020-02-18 ENCOUNTER — Encounter: Payer: Self-pay | Admitting: Family Medicine

## 2020-02-18 ENCOUNTER — Other Ambulatory Visit: Payer: Self-pay | Admitting: *Deleted

## 2020-02-18 ENCOUNTER — Other Ambulatory Visit: Payer: Self-pay

## 2020-02-18 VITALS — BP 131/70 | HR 70 | Temp 97.9°F | Ht 65.0 in | Wt 144.0 lb

## 2020-02-18 DIAGNOSIS — E782 Mixed hyperlipidemia: Secondary | ICD-10-CM

## 2020-02-18 DIAGNOSIS — G4701 Insomnia due to medical condition: Secondary | ICD-10-CM

## 2020-02-18 DIAGNOSIS — F411 Generalized anxiety disorder: Secondary | ICD-10-CM | POA: Diagnosis not present

## 2020-02-18 DIAGNOSIS — Z79899 Other long term (current) drug therapy: Secondary | ICD-10-CM

## 2020-02-18 DIAGNOSIS — J3089 Other allergic rhinitis: Secondary | ICD-10-CM

## 2020-02-18 MED ORDER — FLUTICASONE PROPIONATE 50 MCG/ACT NA SUSP: 2.0000 | Freq: Every day | NASAL | 5 refills | Status: DC

## 2020-02-18 MED ORDER — VENLAFAXINE HCL ER 37.5 MG PO CP24: 37.5000 mg | ORAL_CAPSULE | Freq: Every day | ORAL | 2 refills | Status: DC

## 2020-02-18 MED ORDER — ZOLPIDEM TARTRATE 10 MG PO TABS: 10.0000 mg | ORAL_TABLET | Freq: Every evening | ORAL | 2 refills | Status: DC | PRN

## 2020-02-18 NOTE — Progress Notes (Signed)
Assessment & Plan:  1. Generalized anxiety disorder - Prozac D/C'd. Rx Effexor.  - venlafaxine XR (EFFEXOR XR) 37.5 MG 24 hr capsule; Take 1 capsule (37.5 mg total) by mouth daily with breakfast.  Dispense: 30 capsule; Refill: 2  2. Insomnia due to medical condition - Well controlled on current regimen.  - zolpidem (AMBIEN) 10 MG tablet; Take 1 tablet (10 mg total) by mouth at bedtime as needed. for sleep  Dispense: 30 tablet; Refill: 2 - Compliance Drug Analysis, Ur  3. Controlled substance agreement signed - Signed for Ambien. PDMP reviewed with no red flags. UDS today.   4. Mixed hyperlipidemia - Education provided on high cholesterol and dietary adjustments. Encouraged red yeast rice or fish oil.    Return in about 6 weeks (around 03/31/2020) for anxiety.  Hendricks Limes, MSN, APRN, FNP-C [facility]  Subjective:    Patient ID: Diane [name], female    DOB: December 28, 1955, 64 y.o.   MRN: [id number]  Patient Care Team: Loman Brooklyn, FNP as PCP - General (Family Medicine) Gala Romney Cristopher Estimable, MD as Consulting Physician (Gastroenterology)   Chief Complaint:  Chief Complaint  Patient presents with  . Establish Care    Rakes pt  . Medication Reaction    Prozac- Patient states she has had a rash since being on it x 1 year and would like to change.    HPI: Diane [name] is a 64 y.o. female presenting on 02/18/2020 for Establish Care (Rakes pt) and Medication Reaction (Prozac- Patient states she has had a rash since being on it x 1 year and would like to change.)  Patient reports she has had a rash for the past year since she has been on Prozac.  It has taken this long to figure out what the cause of the rash was.  She has changed everything from soaps, detergents, fabric softeners, etc. She has seen a dermatologist multiple times.  At her most recent visit to the dermatologist a biopsy was taken of the rash and the patient reports she was told it  was either some type of eczema they had never seen before or an allergic reaction to a medication.  She has been weaning herself off the Prozac.  She is currently down to taking it every third day.  The only other medication she has ever tried is Trintellix.  Patient takes Ambien 10 mg at bedtime for sleep which works well for her.  New complaints: None  Social history:  Relevant past medical, surgical, family and social history reviewed and updated as indicated. Interim medical history since our last visit reviewed.  Allergies and medications reviewed and updated.  DATA REVIEWED: CHART IN EPIC  ROS: Negative unless specifically indicated above in HPI.    Current Outpatient Medications:  .  augmented betamethasone dipropionate (DIPROLENE) 0.05 % ointment, Apply topically 2 (two) times daily., Disp: 30 g, Rfl: 0 .  azelastine (ASTELIN) 0.1 % nasal spray, Place 1-2 sprays into both nostrils 2 (two) times daily as needed for rhinitis., Disp: 30 mL, Rfl: 5 .  betamethasone dipropionate 0.05 % cream, Apply 1 application topically 2 (two) times daily., Disp: 30 g, Rfl: 3 .  cetirizine (ZYRTEC) 10 MG tablet, Take 10 mg by mouth at bedtime. , Disp: , Rfl:  .  clotrimazole (LOTRIMIN) 1 % cream, Apply 1 application topically 2 (two) times daily., Disp: 30 g, Rfl: 0 .  dicyclomine (BENTYL) 10 MG capsule, Take 1 capsule (10 mg total) by mouth  2 (two) times daily., Disp: 180 capsule, Rfl: 2 .  EPINEPHrine (AUVI-Q) 0.3 mg/0.3 mL IJ SOAJ injection, Inject 0.3 mLs (0.3 mg total) into the muscle as needed for anaphylaxis., Disp: 2 each, Rfl: 1 .  Olopatadine HCl (PATADAY) 0.2 % SOLN, Place 1 drop into both eyes daily as needed., Disp: 2.5 mL, Rfl: 5 .  omeprazole (PRILOSEC) 20 MG capsule, TAKE ONE CAPSULE BY MOUTH TWICE A DAY, Disp: 180 capsule, Rfl: 1 .  VITAMIN E PO, Take 1 capsule by mouth daily., Disp: , Rfl:  .  zolpidem (AMBIEN) 10 MG tablet, Take 1 tablet (10 mg total) by mouth at bedtime as  needed. for sleep, Disp: 30 tablet, Rfl: 2 .  fluticasone (FLONASE) 50 MCG/ACT nasal spray, Place 2 sprays into both nostrils daily., Disp: 16 g, Rfl: 5 .  venlafaxine XR (EFFEXOR XR) 37.5 MG 24 hr capsule, Take 1 capsule (37.5 mg total) by mouth daily with breakfast., Disp: 30 capsule, Rfl: 2   Allergies  Allergen Reactions  . Other Other (See Comments)  . Penicillins     Unknown type of reaction  . Keppra [Levetiracetam]     irritability  . Prozac [Fluoxetine] Rash   Past Medical History:  Diagnosis Date  . Anxiety   . Arthritis    fingers, left foot  . C. difficile diarrhea 2015  . COPD (chronic obstructive pulmonary disease) ([place])   . Depression   . Eczema   . H pylori ulcer   . Headache(784.0)   . Hypercholesterolemia   . Kidney stones    20 years ago  . Pneumonia    5 years ago  . Seizures ([place])    2 years ago, "cluster of seizures" none since  . Shingles     Past Surgical History:  Procedure Laterality Date  . ABDOMINAL HYSTERECTOMY    . ADENOIDECTOMY    . APPENDECTOMY    . CESAREAN SECTION    . CHOLECYSTECTOMY    . COLONOSCOPY  06/10/2012   RMR: Colonic polyps -removed as described above.   Marland Kitchen KNEE ARTHROSCOPY  1973   right knee,  torn cart  . LUMBAR DISC SURGERY  05/12/2015   L4 L5  . LUMBAR LAMINECTOMY/DECOMPRESSION MICRODISCECTOMY Left 05/12/2015   Procedure: Left L4-5 Microdiscectomy;  Surgeon: Marybelle Killings, MD;  Location: [facility];  Service: Orthopedics;  Laterality: Left;  . TONSILLECTOMY      Social History   Socioeconomic History  . Marital status: Married    Spouse name: [name]   . Number of children: 3  . Years of education: 12+  . Highest education level: Not on file  Occupational History    Employer: [employer]  Tobacco Use  . Smoking status: Former Smoker    Packs/day: 0.50    Years: 30.00    Pack years: 15.00    Types: Cigarettes    Quit date: 12/14/2017    Years since quitting: 2.2  . Smokeless tobacco: Never Used  .  Tobacco comment: 5-10 cigarettes a day (plans to quit prior to this surgery 05/06/15)  Substance and Sexual Activity  . Alcohol use: Yes    Alcohol/week: 21.0 standard drinks    Types: 21 Shots of liquor per week    Comment: 3 per day  . Drug use: No  . Sexual activity: Not on file  Other Topics Concern  . Not on file  Social History Narrative   The patient lives at home with husband [name].  Patient has 3 children.    Patient has he Associates Degree.    Patient is currently working.    Social Determinants of Health   Financial Resource Strain:   . Difficulty of Paying Living Expenses:   Food Insecurity:   . Worried About Charity fundraiser in the Last Year:   . Arboriculturist in the Last Year:   Transportation Needs:   . Film/video editor (Medical):   Marland Kitchen Lack of Transportation (Non-Medical):   Physical Activity:   . Days of Exercise per Week:   . Minutes of Exercise per Session:   Stress:   . Feeling of Stress :   Social Connections:   . Frequency of Communication with Friends and Family:   . Frequency of Social Gatherings with Friends and Family:   . Attends Religious Services:   . Active Member of Clubs or Organizations:   . Attends Archivist Meetings:   Marland Kitchen Marital Status:   Intimate Partner Violence:   . Fear of Current or Ex-Partner:   . Emotionally Abused:   Marland Kitchen Physically Abused:   . Sexually Abused:         Objective:    BP 131/70   Pulse 70   Temp 97.9 F (36.6 C) (Temporal)   Ht 5\' 5"  (1.651 m)   Wt 144 lb (65.3 kg)   SpO2 96%   BMI 23.96 kg/m   Wt Readings from Last 3 Encounters:  02/18/20 144 lb (65.3 kg)  12/03/19 145 lb (65.8 kg)  04/29/19 146 lb (66.2 kg)    Physical Exam Vitals reviewed.  Constitutional:      General: She is not in acute distress.    Appearance: Normal appearance. She is normal weight. She is not ill-appearing, toxic-appearing or diaphoretic.  HENT:     Head: Normocephalic and atraumatic.    Eyes:     General: No scleral icterus.       Right eye: No discharge.        Left eye: No discharge.     Conjunctiva/sclera: Conjunctivae normal.  Cardiovascular:     Rate and Rhythm: Normal rate and regular rhythm.     Heart sounds: Normal heart sounds. No murmur. No friction rub. No gallop.   Pulmonary:     Effort: Pulmonary effort is normal. No respiratory distress.     Breath sounds: Normal breath sounds. No stridor. No wheezing, rhonchi or rales.  Musculoskeletal:        General: Normal range of motion.     Cervical back: Normal range of motion.  Skin:    General: Skin is warm and dry.     Capillary Refill: Capillary refill takes less than 2 seconds.  Neurological:     General: No focal deficit present.     Mental Status: She is alert and oriented to person, place, and time. Mental status is at baseline.  Psychiatric:        Mood and Affect: Mood normal.        Behavior: Behavior normal.        Thought Content: Thought content normal.        Judgment: Judgment normal.     Lab Results  Component Value Date   TSH 0.970 12/03/2019   Lab Results  Component Value Date   WBC 7.2 12/03/2019   HGB 13.0 12/03/2019   HCT 37.3 12/03/2019   MCV 94 12/03/2019   PLT 304 12/03/2019   Lab Results  Component  Value Date   NA 139 12/03/2019   K 4.2 12/03/2019   CO2 24 12/03/2019   GLUCOSE 98 12/03/2019   BUN 14 12/03/2019   CREATININE 0.63 12/03/2019   BILITOT 0.4 12/03/2019   ALKPHOS 66 12/03/2019   AST 16 12/03/2019   ALT 10 12/03/2019   PROT 7.3 12/03/2019   ALBUMIN 4.4 12/03/2019   CALCIUM 9.5 12/03/2019   ANIONGAP 10 05/06/2015   Lab Results  Component Value Date   CHOL 273 (H) 12/03/2019   Lab Results  Component Value Date   HDL 62 12/03/2019   Lab Results  Component Value Date   LDLCALC 164 (H) 12/03/2019   Lab Results  Component Value Date   TRIG 255 (H) 12/03/2019   Lab Results  Component Value Date   CHOLHDL 4.4 12/03/2019   Lab Results   Component Value Date   HGBA1C 5.1 01/11/2018

## 2020-02-18 NOTE — Patient Instructions (Signed)
Fish Oil OR Red Yeast Rice  High Cholesterol  High cholesterol is a condition in which the blood has high levels of a white, waxy, fat-like substance (cholesterol). The human body needs small amounts of cholesterol. The liver makes all the cholesterol that the body needs. Extra (excess) cholesterol comes from the food that we eat. Cholesterol is carried from the liver by the blood through the blood vessels. If you have high cholesterol, deposits (plaques) may build up on the walls of your blood vessels (arteries). Plaques make the arteries narrower and stiffer. Cholesterol plaques increase your risk for heart attack and stroke. Work with your health care provider to keep your cholesterol levels in a healthy range. What increases the risk? This condition is more likely to develop in people who:  Eat foods that are high in animal fat (saturated fat) or cholesterol.  Are overweight.  Are not getting enough exercise.  Have a family history of high cholesterol. What are the signs or symptoms? There are no symptoms of this condition. How is this diagnosed? This condition may be diagnosed from the results of a blood test.  If you are older than age 57, your health care provider may check your cholesterol every 4-6 years.  You may be checked more often if you already have high cholesterol or other risk factors for heart disease. The blood test for cholesterol measures:  "Bad" cholesterol (LDL cholesterol). This is the main type of cholesterol that causes heart disease. The desired level for LDL is less than 100.  "Good" cholesterol (HDL cholesterol). This type helps to protect against heart disease by cleaning the arteries and carrying the LDL away. The desired level for HDL is 60 or higher.  Triglycerides. These are fats that the body can store or burn for energy. The desired number for triglycerides is lower than 150.  Total cholesterol. This is a measure of the total amount of cholesterol  in your blood, including LDL cholesterol, HDL cholesterol, and triglycerides. A healthy number is less than 200. How is this treated? This condition is treated with diet changes, lifestyle changes, and medicines. Diet changes  This may include eating more whole grains, fruits, vegetables, nuts, and fish.  This may also include cutting back on red meat and foods that have a lot of added sugar. Lifestyle changes  Changes may include getting at least 40 minutes of aerobic exercise 3 times a week. Aerobic exercises include walking, biking, and swimming. Aerobic exercise along with a healthy diet can help you maintain a healthy weight.  Changes may also include quitting smoking. Medicines  Medicines are usually given if diet and lifestyle changes have failed to reduce your cholesterol to healthy levels.  Your health care provider may prescribe a statin medicine. Statin medicines have been shown to reduce cholesterol, which can reduce the risk of heart disease. Follow these instructions at home: Eating and drinking If told by your health care provider:  Eat chicken (without skin), fish, veal, shellfish, ground [place] breast, and round or loin cuts of red meat.  Do not eat fried foods or fatty meats, such as hot dogs and salami.  Eat plenty of fruits, such as apples.  Eat plenty of vegetables, such as broccoli, potatoes, and carrots.  Eat beans, peas, and lentils.  Eat grains such as barley, rice, couscous, and bulgur wheat.  Eat pasta without cream sauces.  Use skim or nonfat milk, and eat low-fat or nonfat yogurt and cheeses.  Do not eat or drink  whole milk, cream, ice cream, egg yolks, or hard cheeses.  Do not eat stick margarine or tub margarines that contain trans fats (also called partially hydrogenated oils).  Do not eat saturated tropical oils, such as coconut oil and palm oil.  Do not eat cakes, cookies, crackers, or other baked goods that contain trans fats.  General  instructions  Exercise as directed by your health care provider. Increase your activity level with activities such as gardening, walking, and taking the stairs.  Take over-the-counter and prescription medicines only as told by your health care provider.  Do not use any products that contain nicotine or tobacco, such as cigarettes and e-cigarettes. If you need help quitting, ask your health care provider.  Keep all follow-up visits as told by your health care provider. This is important. Contact a health care provider if:  You are struggling to maintain a healthy diet or weight.  You need help to start on an exercise program.  You need help to stop smoking. Get help right away if:  You have chest pain.  You have trouble breathing. This information is not intended to replace advice given to you by your health care provider. Make sure you discuss any questions you have with your health care provider. Document Revised: 10/05/2017 Document Reviewed: 04/01/2016 Elsevier Patient Education  [place].

## 2020-02-20 LAB — COMPLIANCE DRUG ANALYSIS, UR

## 2020-03-31 ENCOUNTER — Ambulatory Visit (INDEPENDENT_AMBULATORY_CARE_PROVIDER_SITE_OTHER): Payer: 59 | Admitting: Family Medicine

## 2020-03-31 ENCOUNTER — Encounter: Payer: Self-pay | Admitting: Family Medicine

## 2020-03-31 ENCOUNTER — Other Ambulatory Visit: Payer: Self-pay

## 2020-03-31 VITALS — BP 125/74 | HR 76 | Temp 97.4°F | Ht 65.0 in | Wt 145.2 lb

## 2020-03-31 DIAGNOSIS — F411 Generalized anxiety disorder: Secondary | ICD-10-CM | POA: Diagnosis not present

## 2020-03-31 NOTE — Progress Notes (Signed)
Assessment & Plan:  1. Generalized anxiety disorder - Well controlled on current regimen given current situation. Advised she may increase to 75 mg if needed, but to let me know via MyChart so that I can send an updated prescription.    Return in about 4 months (around 07/31/2020) for Controlled substance agreement.  Hendricks Limes, MSN, APRN, FNP-C [facility]  Subjective:    Patient ID: Diane [name], female    DOB: 01-12-1956, 64 y.o.   MRN: [id number]  Patient Care Team: Loman Brooklyn, FNP as PCP - General (Family Medicine) Gala Romney Cristopher Estimable, MD as Consulting Physician (Gastroenterology)   Chief Complaint:  Chief Complaint  Patient presents with  . Anxiety    6 week follow up.  Patient states that her anxiety is high right now since her dad's funeral is this week.     HPI: Diane [name] is a 64 y.o. female presenting on 03/31/2020 for Anxiety (6 week follow up.  Patient states that her anxiety is high right now since her dad's funeral is this week. )  Patient is here for a follow-up of anxiety. She was started on Effexor ~6 weeks ago. She is tolerating well. She is having increased anxiety right since as her dad's funeral is this week. She does not feel this is unusual and does not want to increase her dosage just yet. Her itching rash is gone from the Prozac.   Depression screen [facility] 2/9 03/31/2020 02/18/2020 12/03/2019  Decreased Interest 1 1 0  Down, Depressed, Hopeless 1 0 0  PHQ - 2 Score 2 1 0  Altered sleeping 0 1 -  Tired, decreased energy 1 0 -  Change in appetite 1 1 -  Feeling bad or failure about yourself  1 0 -  Trouble concentrating 1 0 -  Moving slowly or fidgety/restless 1 0 -  Suicidal thoughts 0 0 -  PHQ-9 Score 7 3 -  Difficult doing work/chores Somewhat difficult - -   GAD 7 : Generalized Anxiety Score 03/31/2020 02/18/2020 08/28/2019 04/14/2019  Nervous, Anxious, on Edge 1 1 1 2   Control/stop worrying 1 0 1 2  Worry  too much - different things 1 0 1 2  Trouble relaxing 1 1 0 2  Restless 1 1 0 0  Easily annoyed or irritable 0 0 0 1  Afraid - awful might happen 0 0 0 1  Total GAD 7 Score 5 3 3 10   Anxiety Difficulty Somewhat difficult - - -    New complaints: None  Social history:  Relevant past medical, surgical, family and social history reviewed and updated as indicated. Interim medical history since our last visit reviewed.  Allergies and medications reviewed and updated.  DATA REVIEWED: CHART IN EPIC  ROS: Negative unless specifically indicated above in HPI.    Current Outpatient Medications:  .  augmented betamethasone dipropionate (DIPROLENE) 0.05 % ointment, Apply topically 2 (two) times daily., Disp: 30 g, Rfl: 0 .  azelastine (ASTELIN) 0.1 % nasal spray, Place 1-2 sprays into both nostrils 2 (two) times daily as needed for rhinitis., Disp: 30 mL, Rfl: 5 .  betamethasone dipropionate 0.05 % cream, Apply 1 application topically 2 (two) times daily., Disp: 30 g, Rfl: 3 .  cetirizine (ZYRTEC) 10 MG tablet, Take 10 mg by mouth at bedtime. , Disp: , Rfl:  .  clotrimazole (LOTRIMIN) 1 % cream, Apply 1 application topically 2 (two) times daily., Disp: 30 g, Rfl: 0 .  dicyclomine (BENTYL)  10 MG capsule, Take 1 capsule (10 mg total) by mouth 2 (two) times daily., Disp: 180 capsule, Rfl: 2 .  EPINEPHrine (AUVI-Q) 0.3 mg/0.3 mL IJ SOAJ injection, Inject 0.3 mLs (0.3 mg total) into the muscle as needed for anaphylaxis., Disp: 2 each, Rfl: 1 .  fluticasone (FLONASE) 50 MCG/ACT nasal spray, Place 2 sprays into both nostrils daily., Disp: 16 g, Rfl: 5 .  Olopatadine HCl (PATADAY) 0.2 % SOLN, Place 1 drop into both eyes daily as needed., Disp: 2.5 mL, Rfl: 5 .  omeprazole (PRILOSEC) 20 MG capsule, TAKE ONE CAPSULE BY MOUTH TWICE A DAY, Disp: 180 capsule, Rfl: 1 .  venlafaxine XR (EFFEXOR XR) 37.5 MG 24 hr capsule, Take 1 capsule (37.5 mg total) by mouth daily with breakfast., Disp: 30 capsule, Rfl: 2 .   VITAMIN E PO, Take 1 capsule by mouth daily., Disp: , Rfl:  .  zolpidem (AMBIEN) 10 MG tablet, Take 1 tablet (10 mg total) by mouth at bedtime as needed. for sleep, Disp: 30 tablet, Rfl: 2   Allergies  Allergen Reactions  . Other Other (See Comments)  . Penicillins     Unknown type of reaction  . Keppra [Levetiracetam]     irritability  . Prozac [Fluoxetine] Rash   Past Medical History:  Diagnosis Date  . Anxiety   . Arthritis    fingers, left foot  . C. difficile diarrhea 2015  . COPD (chronic obstructive pulmonary disease) ([place])   . Depression   . Eczema   . H pylori ulcer   . Headache(784.0)   . Hypercholesterolemia   . Kidney stones    20 years ago  . Pneumonia    5 years ago  . Seizures ([place])    2 years ago, "cluster of seizures" none since  . Shingles     Past Surgical History:  Procedure Laterality Date  . ABDOMINAL HYSTERECTOMY    . ADENOIDECTOMY    . APPENDECTOMY    . CESAREAN SECTION    . CHOLECYSTECTOMY    . COLONOSCOPY  06/10/2012   RMR: Colonic polyps -removed as described above.   Marland Kitchen KNEE ARTHROSCOPY  1973   right knee,  torn cart  . LUMBAR DISC SURGERY  05/12/2015   L4 L5  . LUMBAR LAMINECTOMY/DECOMPRESSION MICRODISCECTOMY Left 05/12/2015   Procedure: Left L4-5 Microdiscectomy;  Surgeon: Marybelle Killings, MD;  Location: [place];  Service: Orthopedics;  Laterality: Left;  . TONSILLECTOMY      Social History   Socioeconomic History  . Marital status: Married    Spouse name: [name]   . Number of children: 3  . Years of education: 12+  . Highest education level: Not on file  Occupational History    Employer: [employer]  Tobacco Use  . Smoking status: Former Smoker    Packs/day: 0.50    Years: 30.00    Pack years: 15.00    Types: Cigarettes    Quit date: 12/14/2017    Years since quitting: 2.2  . Smokeless tobacco: Never Used  . Tobacco comment: 5-10 cigarettes a day (plans to quit prior to this surgery 05/06/15)  Vaping Use  . Vaping  Use: Some days  Substance and Sexual Activity  . Alcohol use: Yes    Alcohol/week: 21.0 standard drinks    Types: 21 Shots of liquor per week    Comment: 3 per day  . Drug use: No  . Sexual activity: Not on file  Other Topics Concern  .  Not on file  Social History Narrative   The patient lives at home with husband [name].    Patient has 3 children.    Patient has he Associates Degree.    Patient is currently working.    Social Determinants of Health   Financial Resource Strain:   . Difficulty of Paying Living Expenses:   Food Insecurity:   . Worried About Charity fundraiser in the Last Year:   . Arboriculturist in the Last Year:   Transportation Needs:   . Film/video editor (Medical):   Marland Kitchen Lack of Transportation (Non-Medical):   Physical Activity:   . Days of Exercise per Week:   . Minutes of Exercise per Session:   Stress:   . Feeling of Stress :   Social Connections:   . Frequency of Communication with Friends and Family:   . Frequency of Social Gatherings with Friends and Family:   . Attends Religious Services:   . Active Member of Clubs or Organizations:   . Attends Archivist Meetings:   Marland Kitchen Marital Status:   Intimate Partner Violence:   . Fear of Current or Ex-Partner:   . Emotionally Abused:   Marland Kitchen Physically Abused:   . Sexually Abused:         Objective:    BP 125/74   Pulse 76   Temp (!) 97.4 F (36.3 C) (Temporal)   Ht 5\' 5"  (1.651 m)   Wt 145 lb 3.2 oz (65.9 kg)   SpO2 95%   BMI 24.16 kg/m   Wt Readings from Last 3 Encounters:  03/31/20 145 lb 3.2 oz (65.9 kg)  02/18/20 144 lb (65.3 kg)  12/03/19 145 lb (65.8 kg)    Physical Exam Vitals reviewed.  Constitutional:      General: She is not in acute distress.    Appearance: Normal appearance. She is normal weight. She is not ill-appearing, toxic-appearing or diaphoretic.  HENT:     Head: Normocephalic and atraumatic.  Eyes:     General: No scleral icterus.       Right  eye: No discharge.        Left eye: No discharge.     Conjunctiva/sclera: Conjunctivae normal.  Cardiovascular:     Rate and Rhythm: Normal rate and regular rhythm.     Heart sounds: Normal heart sounds. No murmur heard.  No friction rub. No gallop.   Pulmonary:     Effort: Pulmonary effort is normal. No respiratory distress.     Breath sounds: Normal breath sounds. No stridor. No wheezing, rhonchi or rales.  Musculoskeletal:        General: Normal range of motion.     Cervical back: Normal range of motion.  Skin:    General: Skin is warm and dry.     Capillary Refill: Capillary refill takes less than 2 seconds.  Neurological:     General: No focal deficit present.     Mental Status: She is alert and oriented to person, place, and time. Mental status is at baseline.  Psychiatric:        Mood and Affect: Mood normal.        Behavior: Behavior normal.        Thought Content: Thought content normal.        Judgment: Judgment normal.     Lab Results  Component Value Date   TSH 0.970 12/03/2019   Lab Results  Component Value Date   WBC 7.2  12/03/2019   HGB 13.0 12/03/2019   HCT 37.3 12/03/2019   MCV 94 12/03/2019   PLT 304 12/03/2019   Lab Results  Component Value Date   NA 139 12/03/2019   K 4.2 12/03/2019   CO2 24 12/03/2019   GLUCOSE 98 12/03/2019   BUN 14 12/03/2019   CREATININE 0.63 12/03/2019   BILITOT 0.4 12/03/2019   ALKPHOS 66 12/03/2019   AST 16 12/03/2019   ALT 10 12/03/2019   PROT 7.3 12/03/2019   ALBUMIN 4.4 12/03/2019   CALCIUM 9.5 12/03/2019   ANIONGAP 10 05/06/2015   Lab Results  Component Value Date   CHOL 273 (H) 12/03/2019   Lab Results  Component Value Date   HDL 62 12/03/2019   Lab Results  Component Value Date   LDLCALC 164 (H) 12/03/2019   Lab Results  Component Value Date   TRIG 255 (H) 12/03/2019   Lab Results  Component Value Date   CHOLHDL 4.4 12/03/2019   Lab Results  Component Value Date   HGBA1C 5.1 01/11/2018

## 2020-04-14 ENCOUNTER — Other Ambulatory Visit: Payer: Self-pay | Admitting: Family Medicine

## 2020-04-14 DIAGNOSIS — Z1231 Encounter for screening mammogram for malignant neoplasm of breast: Secondary | ICD-10-CM

## 2020-05-21 ENCOUNTER — Other Ambulatory Visit: Payer: Self-pay | Admitting: Family Medicine

## 2020-05-21 DIAGNOSIS — F411 Generalized anxiety disorder: Secondary | ICD-10-CM

## 2020-06-11 ENCOUNTER — Other Ambulatory Visit: Payer: Self-pay

## 2020-06-11 ENCOUNTER — Encounter: Payer: Self-pay | Admitting: Nurse Practitioner

## 2020-06-11 ENCOUNTER — Ambulatory Visit (INDEPENDENT_AMBULATORY_CARE_PROVIDER_SITE_OTHER): Payer: 59 | Admitting: Nurse Practitioner

## 2020-06-11 VITALS — BP 138/83 | HR 86 | Temp 97.1°F | Resp 20 | Ht 65.0 in | Wt 144.0 lb

## 2020-06-11 DIAGNOSIS — M79672 Pain in left foot: Secondary | ICD-10-CM

## 2020-06-11 DIAGNOSIS — L84 Corns and callosities: Secondary | ICD-10-CM | POA: Insufficient documentation

## 2020-06-11 NOTE — Addendum Note (Signed)
Addended by: Zannie Cove on: 06/11/2020 10:29 AM   Modules accepted: Orders

## 2020-06-11 NOTE — Addendum Note (Signed)
Addended by: Zannie Cove on: 06/11/2020 11:01 AM   Modules accepted: Orders

## 2020-06-11 NOTE — Patient Instructions (Signed)

## 2020-06-11 NOTE — Progress Notes (Signed)
Acute Office Visit  Subjective:    Patient ID: Diane [name], female    DOB: 01/27/1956, 64 y.o.   MRN: [id number]  Chief Complaint  Patient presents with  . Foot Pain    left heel - rash, red, pain x 18 mos     Foot Pain This is a recurrent problem. Episode onset: 18 months ago. The problem has been gradually worsening. Pertinent negatives include no numbness. Treatments tried: Corticosteroids, topical creams. The treatment provided no relief.  Patient in the past has seen dermatology, podiatry, and orthopedic.  Patient reports painful callus under left foot is making her not to have good quality of life.  Past Medical History:  Diagnosis Date  . Anxiety   . Arthritis    fingers, left foot  . C. difficile diarrhea 2015  . COPD (chronic obstructive pulmonary disease) ([place])   . Depression   . Eczema   . H pylori ulcer   . Headache(784.0)   . Hypercholesterolemia   . Kidney stones    20 years ago  . Pneumonia    5 years ago  . Seizures ([place])    2 years ago, "cluster of seizures" none since  . Shingles     Past Surgical History:  Procedure Laterality Date  . ABDOMINAL HYSTERECTOMY    . ADENOIDECTOMY    . APPENDECTOMY    . CESAREAN SECTION    . CHOLECYSTECTOMY    . COLONOSCOPY  06/10/2012   RMR: Colonic polyps -removed as described above.   Marland Kitchen KNEE ARTHROSCOPY  1973   right knee,  torn cart  . LUMBAR DISC SURGERY  05/12/2015   L4 L5  . LUMBAR LAMINECTOMY/DECOMPRESSION MICRODISCECTOMY Left 05/12/2015   Procedure: Left L4-5 Microdiscectomy;  Surgeon: Marybelle Killings, MD;  Location: [place] [facility];  Service: Orthopedics;  Laterality: Left;  . TONSILLECTOMY      Family History  Problem Relation Age of Onset  . Arthritis Mother   . Asthma Mother   . Depression Mother   . Hyperlipidemia Mother   . Varicose Veins Mother   . Arthritis Father   . Heart disease Father   . Hyperlipidemia Father   . Vision loss Father   . Cancer Sister   . COPD Sister   . Early death  Sister   . Cancer Brother   . Alcohol abuse Maternal Grandfather   . Colon cancer Neg Hx     Social History   Socioeconomic History  . Marital status: Married    Spouse name: [name]   . Number of children: 3  . Years of education: 12+  . Highest education level: Not on file  Occupational History    Employer: [employer]  Tobacco Use  . Smoking status: Former Smoker    Packs/day: 0.50    Years: 30.00    Pack years: 15.00    Types: Cigarettes    Quit date: 12/14/2017    Years since quitting: 2.4  . Smokeless tobacco: Never Used  . Tobacco comment: 5-10 cigarettes a day (plans to quit prior to this surgery 05/06/15)  Vaping Use  . Vaping Use: Some days  Substance and Sexual Activity  . Alcohol use: Yes    Alcohol/week: 21.0 standard drinks    Types: 21 Shots of liquor per week    Comment: 3 per day  . Drug use: No  . Sexual activity: Not on file  Other Topics Concern  . Not on file  Social History Narrative  The patient lives at home with husband [name].    Patient has 3 children.    Patient has he Associates Degree.    Patient is currently working.    Social Determinants of Health   Financial Resource Strain:   . Difficulty of Paying Living Expenses: Not on file  Food Insecurity:   . Worried About Charity fundraiser in the Last Year: Not on file  . Ran Out of Food in the Last Year: Not on file  Transportation Needs:   . Lack of Transportation (Medical): Not on file  . Lack of Transportation (Non-Medical): Not on file  Physical Activity:   . Days of Exercise per Week: Not on file  . Minutes of Exercise per Session: Not on file  Stress:   . Feeling of Stress : Not on file  Social Connections:   . Frequency of Communication with Friends and Family: Not on file  . Frequency of Social Gatherings with Friends and Family: Not on file  . Attends Religious Services: Not on file  . Active Member of Clubs or Organizations: Not on file  . Attends English as a second language teacher Meetings: Not on file  . Marital Status: Not on file  Intimate Partner Violence:   . Fear of Current or Ex-Partner: Not on file  . Emotionally Abused: Not on file  . Physically Abused: Not on file  . Sexually Abused: Not on file    Outpatient Medications Prior to Visit  Medication Sig Dispense Refill  . azelastine (ASTELIN) 0.1 % nasal spray Place 1-2 sprays into both nostrils 2 (two) times daily as needed for rhinitis. 30 mL 5  . cetirizine (ZYRTEC) 10 MG tablet Take 10 mg by mouth at bedtime.     . dicyclomine (BENTYL) 10 MG capsule Take 1 capsule (10 mg total) by mouth 2 (two) times daily. 180 capsule 2  . fluticasone (FLONASE) 50 MCG/ACT nasal spray Place 2 sprays into both nostrils daily. 16 g 5  . Olopatadine HCl (PATADAY) 0.2 % SOLN Place 1 drop into both eyes daily as needed. 2.5 mL 5  . omeprazole (PRILOSEC) 20 MG capsule TAKE ONE CAPSULE BY MOUTH TWICE A DAY 180 capsule 1  . venlafaxine XR (EFFEXOR-XR) 37.5 MG 24 hr capsule TAKE 1 CAPSULE EVERY MORNING WITH BREAKFAST 30 capsule 1  . VITAMIN E PO Take 1 capsule by mouth daily.    Marland Kitchen zolpidem (AMBIEN) 10 MG tablet Take 1 tablet (10 mg total) by mouth at bedtime as needed. for sleep 30 tablet 2  . EPINEPHrine (AUVI-Q) 0.3 mg/0.3 mL IJ SOAJ injection Inject 0.3 mLs (0.3 mg total) into the muscle as needed for anaphylaxis. (Patient not taking: Reported on 06/11/2020) 2 each 1  . augmented betamethasone dipropionate (DIPROLENE) 0.05 % ointment Apply topically 2 (two) times daily. 30 g 0  . betamethasone dipropionate 0.05 % cream Apply 1 application topically 2 (two) times daily. 30 g 3  . clotrimazole (LOTRIMIN) 1 % cream Apply 1 application topically 2 (two) times daily. 30 g 0   No facility-administered medications prior to visit.    Allergies  Allergen Reactions  . Other Other (See Comments)  . Penicillins     Unknown type of reaction  . Keppra [Levetiracetam]     irritability  . Prozac [Fluoxetine] Rash     Review of Systems  Constitutional: Negative.   HENT: Negative.   Eyes: Negative.   Cardiovascular: Negative.   Gastrointestinal: Negative.   Genitourinary: Negative.  Musculoskeletal:       Left foot pain, bleeding calluse  Skin:       Hard bleeding callus under left foot  Neurological: Negative for numbness.       Objective:    Physical Exam Vitals reviewed.  Constitutional:      Appearance: Normal appearance. She is normal weight.  HENT:     Head: Normocephalic.     Mouth/Throat:     Mouth: Mucous membranes are moist.  Eyes:     Conjunctiva/sclera: Conjunctivae normal.  Cardiovascular:     Rate and Rhythm: Normal rate and regular rhythm.     Pulses: Normal pulses.     Heart sounds: Normal heart sounds.  Pulmonary:     Effort: Pulmonary effort is normal.     Breath sounds: Normal breath sounds.  Abdominal:     General: Bowel sounds are normal.  Musculoskeletal:        General: Tenderness present.  Skin:    Findings: Erythema present.     Comments: Heart bleeding callus under left foot  Neurological:     Mental Status: She is alert and oriented to person, place, and time.     Resp 20   Ht 5\' 5"  (1.651 m)   Wt 144 lb (65.3 kg)   BMI 23.96 kg/m  Wt Readings from Last 3 Encounters:  06/11/20 144 lb (65.3 kg)  03/31/20 145 lb 3.2 oz (65.9 kg)  02/18/20 144 lb (65.3 kg)    Health Maintenance Due  Topic Date Due  . MAMMOGRAM  Never done  . INFLUENZA VACCINE  05/16/2020    There are no preventive care reminders to display for this patient.   Lab Results  Component Value Date   TSH 0.970 12/03/2019   Lab Results  Component Value Date   WBC 7.2 12/03/2019   HGB 13.0 12/03/2019   HCT 37.3 12/03/2019   MCV 94 12/03/2019   PLT 304 12/03/2019   Lab Results  Component Value Date   NA 139 12/03/2019   K 4.2 12/03/2019   CO2 24 12/03/2019   GLUCOSE 98 12/03/2019   BUN 14 12/03/2019   CREATININE 0.63 12/03/2019   BILITOT 0.4 12/03/2019    ALKPHOS 66 12/03/2019   AST 16 12/03/2019   ALT 10 12/03/2019   PROT 7.3 12/03/2019   ALBUMIN 4.4 12/03/2019   CALCIUM 9.5 12/03/2019   ANIONGAP 10 05/06/2015   Lab Results  Component Value Date   CHOL 273 (H) 12/03/2019   Lab Results  Component Value Date   HDL 62 12/03/2019   Lab Results  Component Value Date   LDLCALC 164 (H) 12/03/2019   Lab Results  Component Value Date   TRIG 255 (H) 12/03/2019   Lab Results  Component Value Date   CHOLHDL 4.4 12/03/2019   Lab Results  Component Value Date   HGBA1C 5.1 01/11/2018       Assessment & Plan:   Problem List Items Addressed This Visit      Musculoskeletal and Integument   Corns and callosities   Relevant Orders   Skin biopsy     Other   Left foot pain - Primary   Relevant Orders   Arthritis Panel   CBC with Differential       No orders of the defined types were placed in this encounter.    Ivy Lynn, NP

## 2020-06-12 LAB — ARTHRITIS PANEL
Basophils Absolute: 0 10*3/uL (ref 0.0–0.2)
Basos: 1 %
EOS (ABSOLUTE): 0.2 10*3/uL (ref 0.0–0.4)
Eos: 3 %
Hematocrit: 42.1 % (ref 34.0–46.6)
Hemoglobin: 14.1 g/dL (ref 11.1–15.9)
Immature Grans (Abs): 0 10*3/uL (ref 0.0–0.1)
Immature Granulocytes: 1 %
Lymphocytes Absolute: 1.8 10*3/uL (ref 0.7–3.1)
Lymphs: 28 %
MCH: 32.7 pg (ref 26.6–33.0)
MCHC: 33.5 g/dL (ref 31.5–35.7)
MCV: 98 fL — ABNORMAL HIGH (ref 79–97)
Monocytes Absolute: 0.5 10*3/uL (ref 0.1–0.9)
Monocytes: 7 %
Neutrophils Absolute: 4 10*3/uL (ref 1.4–7.0)
Neutrophils: 60 %
Platelets: 338 10*3/uL (ref 150–450)
RBC: 4.31 x10E6/uL (ref 3.77–5.28)
RDW: 12.2 % (ref 11.7–15.4)
Rheumatoid fact SerPl-aCnc: 12.2 IU/mL (ref 0.0–13.9)
Sed Rate: 57 mm/hr — ABNORMAL HIGH (ref 0–40)
Uric Acid: 5 mg/dL (ref 3.0–7.2)
WBC: 6.6 10*3/uL (ref 3.4–10.8)

## 2020-06-12 LAB — C-REACTIVE PROTEIN: CRP: 9 mg/L (ref 0–10)

## 2020-06-14 NOTE — Telephone Encounter (Signed)
Please get this patient an appoint tomorrow ASAP

## 2020-06-15 ENCOUNTER — Encounter: Payer: Self-pay | Admitting: Nurse Practitioner

## 2020-06-15 ENCOUNTER — Ambulatory Visit (INDEPENDENT_AMBULATORY_CARE_PROVIDER_SITE_OTHER): Payer: 59 | Admitting: Nurse Practitioner

## 2020-06-15 ENCOUNTER — Other Ambulatory Visit: Payer: Self-pay

## 2020-06-15 VITALS — BP 133/76 | HR 77 | Temp 97.7°F | Ht 65.0 in | Wt 145.0 lb

## 2020-06-15 DIAGNOSIS — L84 Corns and callosities: Secondary | ICD-10-CM

## 2020-06-15 NOTE — Patient Instructions (Signed)

## 2020-06-15 NOTE — Assessment & Plan Note (Signed)
Patient's foot is healing well after punch biopsy. Patient called to report that place on left leg was dark in color and she was uncomfortable the way her foot had changed.  After assessment, dry blood seen under skin, no redness, skin peeling /sloughing off, no necrotic tissue observed.  No signs or symptoms of infection process.  Provided education and reassurance to patient.  Went over labs with patient, patient verbalized understanding.  Printed handouts given.  Punch biopsy results pending, will treat appropriately.  Follow-up as needed

## 2020-06-15 NOTE — Progress Notes (Signed)
Established Patient Office Visit  Subjective:  Patient ID: Diane [name], female    DOB: [birth year]  Age: 64 y.o. MRN: [id number]  CC:  Chief Complaint  Patient presents with  . Sore on Foot    Left    HPI Diane [name] presents for sore foot and calluses.  Patient had a punch biopsy done last week and today she is in to report a black colored spot on her left leg where the biopsy was done.  Patient is not reporting any signs or symptoms of infection, increased pain, or bleeding.  Past Medical History:  Diagnosis Date  . Anxiety   . Arthritis    fingers, left foot  . C. difficile diarrhea 2015  . COPD (chronic obstructive pulmonary disease) ([place])   . Depression   . Eczema   . H pylori ulcer   . Headache(784.0)   . Hypercholesterolemia   . Kidney stones    20 years ago  . Pneumonia    5 years ago  . Seizures ([place])    2 years ago, "cluster of seizures" none since  . Shingles     Past Surgical History:  Procedure Laterality Date  . ABDOMINAL HYSTERECTOMY    . ADENOIDECTOMY    . APPENDECTOMY    . CESAREAN SECTION    . CHOLECYSTECTOMY    . COLONOSCOPY  06/10/2012   RMR: Colonic polyps -removed as described above.   Marland Kitchen KNEE ARTHROSCOPY  1973   right knee,  torn cart  . LUMBAR DISC SURGERY  05/12/2015   L4 L5  . LUMBAR LAMINECTOMY/DECOMPRESSION MICRODISCECTOMY Left 05/12/2015   Procedure: Left L4-5 Microdiscectomy;  Surgeon: Marybelle Killings, MD;  Location: [facility];  Service: Orthopedics;  Laterality: Left;  . TONSILLECTOMY      Family History  Problem Relation Age of Onset  . Arthritis Mother   . Asthma Mother   . Depression Mother   . Hyperlipidemia Mother   . Varicose Veins Mother   . Arthritis Father   . Heart disease Father   . Hyperlipidemia Father   . Vision loss Father   . Cancer Sister   . COPD Sister   . Early death Sister   . Cancer Brother   . Alcohol abuse Maternal Grandfather   . Colon cancer Neg Hx     Social History    Socioeconomic History  . Marital status: Married    Spouse name: [name]   . Number of children: 3  . Years of education: 12+  . Highest education level: Not on file  Occupational History    Employer: [employer]  Tobacco Use  . Smoking status: Former Smoker    Packs/day: 0.50    Years: 30.00    Pack years: 15.00    Types: Cigarettes    Quit date: 12/14/2017    Years since quitting: 2.5  . Smokeless tobacco: Never Used  . Tobacco comment: 5-10 cigarettes a day (plans to quit prior to this surgery 05/06/15)  Vaping Use  . Vaping Use: Some days  Substance and Sexual Activity  . Alcohol use: Yes    Alcohol/week: 21.0 standard drinks    Types: 21 Shots of liquor per week    Comment: 3 per day  . Drug use: No  . Sexual activity: Not on file  Other Topics Concern  . Not on file  Social History Narrative   The patient lives at home with husband [name].    Patient has 3 children.  Patient has he Associates Degree.    Patient is currently working.    Social Determinants of Health   Financial Resource Strain:   . Difficulty of Paying Living Expenses: Not on file  Food Insecurity:   . Worried About Charity fundraiser in the Last Year: Not on file  . Ran Out of Food in the Last Year: Not on file  Transportation Needs:   . Lack of Transportation (Medical): Not on file  . Lack of Transportation (Non-Medical): Not on file  Physical Activity:   . Days of Exercise per Week: Not on file  . Minutes of Exercise per Session: Not on file  Stress:   . Feeling of Stress : Not on file  Social Connections:   . Frequency of Communication with Friends and Family: Not on file  . Frequency of Social Gatherings with Friends and Family: Not on file  . Attends Religious Services: Not on file  . Active Member of Clubs or Organizations: Not on file  . Attends Archivist Meetings: Not on file  . Marital Status: Not on file  Intimate Partner Violence:   . Fear of  Current or Ex-Partner: Not on file  . Emotionally Abused: Not on file  . Physically Abused: Not on file  . Sexually Abused: Not on file    Outpatient Medications Prior to Visit  Medication Sig Dispense Refill  . azelastine (ASTELIN) 0.1 % nasal spray Place 1-2 sprays into both nostrils 2 (two) times daily as needed for rhinitis. 30 mL 5  . cetirizine (ZYRTEC) 10 MG tablet Take 10 mg by mouth at bedtime.     . dicyclomine (BENTYL) 10 MG capsule Take 1 capsule (10 mg total) by mouth 2 (two) times daily. 180 capsule 2  . EPINEPHrine (AUVI-Q) 0.3 mg/0.3 mL IJ SOAJ injection Inject 0.3 mLs (0.3 mg total) into the muscle as needed for anaphylaxis. 2 each 1  . fluticasone (FLONASE) 50 MCG/ACT nasal spray Place 2 sprays into both nostrils daily. 16 g 5  . Olopatadine HCl (PATADAY) 0.2 % SOLN Place 1 drop into both eyes daily as needed. 2.5 mL 5  . omeprazole (PRILOSEC) 20 MG capsule TAKE ONE CAPSULE BY MOUTH TWICE A DAY 180 capsule 1  . venlafaxine XR (EFFEXOR-XR) 37.5 MG 24 hr capsule TAKE 1 CAPSULE EVERY MORNING WITH BREAKFAST 30 capsule 1  . VITAMIN E PO Take 1 capsule by mouth daily.    Marland Kitchen zolpidem (AMBIEN) 10 MG tablet Take 1 tablet (10 mg total) by mouth at bedtime as needed. for sleep 30 tablet 2   No facility-administered medications prior to visit.    Allergies  Allergen Reactions  . Other Other (See Comments)  . Penicillins     Unknown type of reaction  . Keppra [Levetiracetam]     irritability  . Prozac [Fluoxetine] Rash    ROS Review of Systems  Constitutional: Negative.   HENT: Negative.   Respiratory: Negative.   Gastrointestinal: Negative.   Genitourinary: Negative.   Musculoskeletal: Negative.   Skin: Positive for color change and wound.  Neurological: Negative.       Objective:    Physical Exam Constitutional:      Appearance: Normal appearance.  HENT:     Head: Normocephalic.     Nose: Nose normal.  Eyes:     Conjunctiva/sclera: Conjunctivae normal.    Cardiovascular:     Rate and Rhythm: Normal rate and regular rhythm.     Pulses: Normal pulses.  Heart sounds: Normal heart sounds.  Pulmonary:     Effort: Pulmonary effort is normal.     Breath sounds: Normal breath sounds.  Musculoskeletal:        General: Tenderness present.  Skin:    General: Skin is dry.  Neurological:     Mental Status: She is alert and oriented to person, place, and time.  Psychiatric:        Mood and Affect: Mood normal.     BP 133/76   Pulse 77   Temp 97.7 F (36.5 C) (Temporal)   Ht 5\' 5"  (1.651 m)   Wt 145 lb (65.8 kg)   SpO2 96%   BMI 24.13 kg/m  Wt Readings from Last 3 Encounters:  06/15/20 145 lb (65.8 kg)  06/11/20 144 lb (65.3 kg)  03/31/20 145 lb 3.2 oz (65.9 kg)     Health Maintenance Due  Topic Date Due  . MAMMOGRAM  Never done  . INFLUENZA VACCINE  05/16/2020    There are no preventive care reminders to display for this patient.  Lab Results  Component Value Date   TSH 0.970 12/03/2019   Lab Results  Component Value Date   WBC 6.6 06/11/2020   HGB 14.1 06/11/2020   HCT 42.1 06/11/2020   MCV 98 (H) 06/11/2020   PLT 338 06/11/2020   Lab Results  Component Value Date   NA 139 12/03/2019   K 4.2 12/03/2019   CO2 24 12/03/2019   GLUCOSE 98 12/03/2019   BUN 14 12/03/2019   CREATININE 0.63 12/03/2019   BILITOT 0.4 12/03/2019   ALKPHOS 66 12/03/2019   AST 16 12/03/2019   ALT 10 12/03/2019   PROT 7.3 12/03/2019   ALBUMIN 4.4 12/03/2019   CALCIUM 9.5 12/03/2019   ANIONGAP 10 05/06/2015   Lab Results  Component Value Date   CHOL 273 (H) 12/03/2019   Lab Results  Component Value Date   HDL 62 12/03/2019   Lab Results  Component Value Date   LDLCALC 164 (H) 12/03/2019   Lab Results  Component Value Date   TRIG 255 (H) 12/03/2019   Lab Results  Component Value Date   CHOLHDL 4.4 12/03/2019   Lab Results  Component Value Date   HGBA1C 5.1 01/11/2018      Assessment & Plan:   Problem List  Items Addressed This Visit      Musculoskeletal and Integument   Corns and callosities - Primary    Patient's foot is healing well after punch biopsy. Patient called to report that place on left leg was dark in color and she was uncomfortable the way her foot had changed.  After assessment, dry blood seen under skin, no redness, skin peeling /sloughing off, no necrotic tissue observed.  No signs or symptoms of infection process.  Provided education and reassurance to patient.  Went over labs with patient, patient verbalized understanding.  Printed handouts given.  Punch biopsy results pending, will treat appropriately.  Follow-up as needed           Follow-up: Return if symptoms worsen or fail to improve.    Ivy Lynn, NP

## 2020-06-16 LAB — ANATOMIC PATHOLOGY REPORT

## 2020-06-21 ENCOUNTER — Other Ambulatory Visit: Payer: Self-pay | Admitting: Nurse Practitioner

## 2020-06-21 DIAGNOSIS — L570 Actinic keratosis: Secondary | ICD-10-CM

## 2020-07-12 ENCOUNTER — Other Ambulatory Visit: Payer: Self-pay | Admitting: Family Medicine

## 2020-07-12 DIAGNOSIS — F411 Generalized anxiety disorder: Secondary | ICD-10-CM

## 2020-07-12 DIAGNOSIS — G4701 Insomnia due to medical condition: Secondary | ICD-10-CM

## 2020-07-13 ENCOUNTER — Other Ambulatory Visit: Payer: Self-pay | Admitting: *Deleted

## 2020-07-13 DIAGNOSIS — K219 Gastro-esophageal reflux disease without esophagitis: Secondary | ICD-10-CM

## 2020-07-13 MED ORDER — OMEPRAZOLE 20 MG PO CPDR: 20.0000 mg | DELAYED_RELEASE_CAPSULE | Freq: Two times a day (BID) | ORAL | 0 refills | Status: DC

## 2020-08-09 ENCOUNTER — Other Ambulatory Visit: Payer: Self-pay | Admitting: Family Medicine

## 2020-08-09 DIAGNOSIS — G4701 Insomnia due to medical condition: Secondary | ICD-10-CM

## 2020-09-01 ENCOUNTER — Other Ambulatory Visit: Payer: Self-pay | Admitting: Family Medicine

## 2020-09-01 DIAGNOSIS — K219 Gastro-esophageal reflux disease without esophagitis: Secondary | ICD-10-CM

## 2020-11-12 ENCOUNTER — Other Ambulatory Visit: Payer: Self-pay | Admitting: Family

## 2020-11-12 DIAGNOSIS — K219 Gastro-esophageal reflux disease without esophagitis: Secondary | ICD-10-CM

## 2020-12-07 ENCOUNTER — Other Ambulatory Visit: Payer: Self-pay

## 2020-12-07 ENCOUNTER — Encounter: Payer: Self-pay | Admitting: Dermatology

## 2020-12-07 ENCOUNTER — Ambulatory Visit (INDEPENDENT_AMBULATORY_CARE_PROVIDER_SITE_OTHER): Payer: 59 | Admitting: Dermatology

## 2020-12-07 DIAGNOSIS — L409 Psoriasis, unspecified: Secondary | ICD-10-CM | POA: Diagnosis not present

## 2020-12-07 MED ORDER — CLOBETASOL PROPIONATE 0.05 % EX OINT: 1.0000 "application " | TOPICAL_OINTMENT | Freq: Two times a day (BID) | CUTANEOUS | 0 refills | Status: DC

## 2020-12-07 MED ORDER — ACITRETIN 10 MG PO CAPS: 10.0000 mg | ORAL_CAPSULE | Freq: Every day | ORAL | 1 refills | Status: DC

## 2020-12-13 ENCOUNTER — Encounter: Payer: Self-pay | Admitting: Dermatology

## 2020-12-18 ENCOUNTER — Encounter: Payer: Self-pay | Admitting: Dermatology

## 2020-12-18 NOTE — Progress Notes (Signed)
   New Patient   Subjective  Bricia Taher is a 65 y.o. female who presents for the following: New Patient (Initial Visit) (Raw spots both hands and both feet, bleeding, x 1+year. Patient has tried TAC oint & Salicylic acid oint, patient just stopped prednisone. Patient also has a list of other treatments over the last year. Per patient acid did help, TAC helped as well. ).  Hands and feet with bleeding and pain for over a year Location:  Duration:  Quality:  Associated Signs/Symptoms: Modifying Factors: Prednisone, triamcinolone. Severity:  Timing: Context:    The following portions of the chart were reviewed this encounter and updated as appropriate:  Tobacco  Allergies  Meds  Problems  Med Hx  Surg Hx  Fam Hx      Objective  Well appearing patient in no apparent distress; mood and affect are within normal limits. Objective  Left Foot - Anterior, Left Hand - Anterior, Right Foot - Anterior, Right Hand - Anterior: Patient has had significant negative impact on daily functioning and quality of life over the past 1+ year.  Never gets clear.  Frequently has painful splits.  Multiple previous treatments without ever getting clear.  Examination showed markedly hyperkeratotic psoriasiform patches mostly of palmar but with some dorsal foot involvement.  No changes scalp, ears, nails.  POSSIBLE SKYRIZI NEW START IF TREATMENT FAILS PATIENT STATED ITS AFFECTING HER SLEEP   A focused examination including the scalp, ears, face, neck, back, arms, nails, legs performed. Patient has had significant negative impact on daily functioning and quality of life over the past 1+ year.  Never gets clear.  Frequently has painful splits.  Multiple previous treatments without ever getting clear.  Examination showed markedly hyperkeratotic psoriasiform patches mostly of palmar but with some dorsal foot involvement.  No changes scalp, ears, nails.    We will initiate therapy with clobetasol  ointment use under moist wrap at least 1 time daily for 30 minutes.  She will also begin low-dose Soriatane, initially 10 mg daily.  Possibility of hair shedding discussed.  If she fails this relatively conservative regimen she may be a candidate for Biologics.  Recheck 4 to 6 weeks  Assessment & Plan  Psoriasis, acral, severe.

## 2020-12-21 ENCOUNTER — Other Ambulatory Visit: Payer: Self-pay | Admitting: Family Medicine

## 2020-12-21 DIAGNOSIS — K219 Gastro-esophageal reflux disease without esophagitis: Secondary | ICD-10-CM

## 2021-01-05 ENCOUNTER — Encounter: Payer: Self-pay | Admitting: Dermatology

## 2021-01-05 ENCOUNTER — Ambulatory Visit (INDEPENDENT_AMBULATORY_CARE_PROVIDER_SITE_OTHER): Payer: 59 | Admitting: Dermatology

## 2021-01-05 ENCOUNTER — Other Ambulatory Visit: Payer: Self-pay

## 2021-01-05 DIAGNOSIS — L409 Psoriasis, unspecified: Secondary | ICD-10-CM

## 2021-01-05 MED ORDER — ACITRETIN 25 MG PO CAPS: 25.0000 mg | ORAL_CAPSULE | Freq: Every day | ORAL | 1 refills | Status: DC

## 2021-01-07 LAB — LIPID PANEL
Cholesterol: 232 mg/dL — ABNORMAL HIGH (ref <200)
HDL: 55 mg/dL (ref >=50)
LDL Cholesterol (Calc): 152 mg/dL (calc) — ABNORMAL HIGH
Non-HDL Cholesterol (Calc): 177 mg/dL (calc) — ABNORMAL HIGH (ref <130)
Total CHOL/HDL Ratio: 4.2 (calc) (ref <5.0)
Triglycerides: 123 mg/dL (ref <150)

## 2021-01-10 ENCOUNTER — Telehealth: Payer: Self-pay | Admitting: *Deleted

## 2021-01-10 DIAGNOSIS — Z5181 Encounter for therapeutic drug level monitoring: Secondary | ICD-10-CM

## 2021-01-10 NOTE — Telephone Encounter (Signed)
-----   Message from Lavonna Monarch, MD sent at 01/07/2021  7:36 AM EDT ----- Please let patient know that her lipids are slightly elevated but she can proceed to get the new prescription for the 25 mg Soriatane.  She should recheck a fasting lipid panel in 6 weeks.

## 2021-01-10 NOTE — Telephone Encounter (Signed)
Blood results to patient- informed patient we will recheck fasting labs in 6 weeks per Dr. Denna Haggard. patient understood. Lab order sent.

## 2021-01-17 ENCOUNTER — Encounter: Payer: Self-pay | Admitting: Dermatology

## 2021-01-17 NOTE — Progress Notes (Signed)
   Follow-Up Visit   Subjective  Diane [name] is a 65 y.o. female who presents for the following: Follow-up (FOLLOW UP, PSORIASIS ON HANDS AND FEET. HANDS HAVE IMPROVED, BUT FEET HAVE NOT. ).  Psoriasis Location:  Duration:  Quality:  Associated Signs/Symptoms: Modifying Factors: 1 mg Soriatane (acitretin) Severity:  Timing: Context:   Objective  Well appearing patient in no apparent distress; mood and affect are within normal limits. Objective  Left Foot - Anterior, Left Hallux Metatarsal Plantar Area, Left Hand - Anterior, Left Knee - Anterior, Right Foot - Anterior, Right Hallux Metatarsal Plantar Area, Right Hand - Anterior, Right Knee - Anterior: Predominantly acral hyperkeratotic psoriasis;'s feet with only modest improvement.  Tolerating Soriatane well.    A focused examination was performed including Arms and legs and nails.. Relevant physical exam findings are noted in the Assessment and Plan.   Assessment & Plan    Psoriasis (8) Left Hand - Anterior; Right Hand - Anterior; Left Knee - Anterior; Right Knee - Anterior; Left Foot - Anterior; Right Foot - Anterior; Left Hallux Metatarsal Plantar Area; Right Hallux Metatarsal Plantar Area  Discussed going to Biologics but patient and I mutually agreed that the logical next step would be to increase the Soriatane to 25 mg daily.  Side effects again reviewed including potential for hair shedding.  We will recheck fasting lipids.  Follow-up 2 months but may cancel visit if doing well.  Other Related Procedures Lipid panel  Ordered Medications: acitretin (SORIATANE) 25 MG capsule  Other Related Medications triamcinolone ointment (KENALOG) 0.1 % clobetasol ointment (TEMOVATE) 0.05 %      I, Lavonna Monarch, MD, have reviewed all documentation for this visit.  The documentation on 01/17/21 for the exam, diagnosis, procedures, and orders are all accurate and complete.

## 2021-01-27 ENCOUNTER — Other Ambulatory Visit: Payer: Self-pay | Admitting: Dermatology

## 2021-01-27 DIAGNOSIS — L409 Psoriasis, unspecified: Secondary | ICD-10-CM

## 2021-02-15 ENCOUNTER — Other Ambulatory Visit: Payer: Self-pay | Admitting: Dermatology

## 2021-02-18 LAB — LIPID PANEL
Cholesterol: 258 mg/dL — ABNORMAL HIGH (ref <200)
HDL: 63 mg/dL (ref >=50)
LDL Cholesterol (Calc): 169 mg/dL (calc) — ABNORMAL HIGH
Non-HDL Cholesterol (Calc): 195 mg/dL (calc) — ABNORMAL HIGH (ref <130)
Total CHOL/HDL Ratio: 4.1 (calc) (ref <5.0)
Triglycerides: 129 mg/dL (ref <150)

## 2021-02-21 ENCOUNTER — Telehealth: Payer: Self-pay

## 2021-02-21 NOTE — Telephone Encounter (Signed)
Patient aware and has a follow up with pcp already made.

## 2021-02-21 NOTE — Telephone Encounter (Signed)
-----   Message from Lavonna Monarch, MD sent at 02/18/2021  7:15 AM EDT ----- Please let patient know that her cholesterol levels are elevated both on the medication as well as the baseline level.  The triglyceride levels are acceptable and she can continue with the Soriatane, but Dr. Darene Lamer would like her to discuss treatment options for the cholesterol with her primary care physician.

## 2021-02-24 ENCOUNTER — Other Ambulatory Visit: Payer: Self-pay

## 2021-02-24 ENCOUNTER — Ambulatory Visit (INDEPENDENT_AMBULATORY_CARE_PROVIDER_SITE_OTHER): Payer: 59 | Admitting: Dermatology

## 2021-02-24 DIAGNOSIS — L409 Psoriasis, unspecified: Secondary | ICD-10-CM | POA: Diagnosis not present

## 2021-02-28 ENCOUNTER — Encounter: Payer: Self-pay | Admitting: Dermatology

## 2021-03-02 ENCOUNTER — Ambulatory Visit: Payer: 59 | Admitting: Physician Assistant

## 2021-03-09 ENCOUNTER — Other Ambulatory Visit: Payer: Self-pay | Admitting: Dermatology

## 2021-03-09 DIAGNOSIS — L409 Psoriasis, unspecified: Secondary | ICD-10-CM

## 2021-03-12 ENCOUNTER — Encounter: Payer: Self-pay | Admitting: Dermatology

## 2021-03-12 NOTE — Progress Notes (Signed)
   Follow-Up Visit   Subjective  Diane [name] is a 65 y.o. female who presents for the following: Psoriasis (Follow up psoriasis hands and feet. Patient is improving. Tx acitretin and clobetasol ointment. ).  Psoriasis Location: Hands and feet Duration:  Quality: Improved Associated Signs/Symptoms: Modifying Factors: Isotretinoin Severity:  Timing: Context:   Objective  Well appearing patient in no apparent distress; mood and affect are within normal limits. Objective  Left Foot - Anterior, Left Hallux Metatarsal Plantar Area, Left Hand - Anterior, Left Knee - Anterior, Right Foot - Anterior, Right Hallux Metatarsal Plantar Area, Right Hand - Anterior, Right Knee - Anterior: Perhaps 60% less erythema and scale, no active fissures.  No significant side effects or hair loss on the acitretin.    A focused examination was performed including Hands and feet.. Relevant physical exam findings are noted in the Assessment and Plan.   Assessment & Plan    Psoriasis (8) Left Hand - Anterior; Right Hand - Anterior; Left Knee - Anterior; Right Knee - Anterior; Left Foot - Anterior; Right Foot - Anterior; Left Hallux Metatarsal Plantar Area; Right Hallux Metatarsal Plantar Area  Patient expressed some concern that the treatment is already picking and effectiveness, but I told her that isotretinoin takes 3 to 6 months to really evaluate its benefit.  She can use topical clobetasol on a as needed basis.  Other Related Medications triamcinolone ointment (KENALOG) 0.1 % clobetasol ointment (TEMOVATE) 0.05 % clobetasol ointment (TEMOVATE) 0.05 % acitretin (SORIATANE) 25 MG capsule      I, Lavonna Monarch, MD, have reviewed all documentation for this visit.  The documentation on 03/12/21 for the exam, diagnosis, procedures, and orders are all accurate and complete.

## 2021-05-22 ENCOUNTER — Other Ambulatory Visit: Payer: Self-pay | Admitting: Dermatology

## 2021-06-06 ENCOUNTER — Other Ambulatory Visit: Payer: Self-pay | Admitting: Dermatology

## 2021-06-06 DIAGNOSIS — L409 Psoriasis, unspecified: Secondary | ICD-10-CM

## 2021-08-31 ENCOUNTER — Encounter: Payer: Self-pay | Admitting: Dermatology

## 2021-08-31 ENCOUNTER — Ambulatory Visit (INDEPENDENT_AMBULATORY_CARE_PROVIDER_SITE_OTHER): Payer: 59 | Admitting: Dermatology

## 2021-08-31 ENCOUNTER — Ambulatory Visit: Payer: 59 | Admitting: Dermatology

## 2021-08-31 ENCOUNTER — Other Ambulatory Visit: Payer: Self-pay

## 2021-08-31 DIAGNOSIS — L409 Psoriasis, unspecified: Secondary | ICD-10-CM

## 2021-08-31 NOTE — Patient Instructions (Addendum)
If hands and feet are flaring add a couple pills of 400 IU vitamin E daily. Have the pharmacist contact us for refill.

## 2021-09-26 ENCOUNTER — Encounter: Payer: Self-pay | Admitting: Dermatology

## 2021-09-26 NOTE — Progress Notes (Signed)
   Follow-Up Visit   Subjective  Diane [name] is a 65 y.o. female who presents for the following: Psoriasis (Pretty clear- but makes my hair fall out and lips dry tx- soriatane ).  Psoriasis, hands and feet Location:  Duration:  Quality:  Associated Signs/Symptoms: Modifying Factors:  Severity:  Timing: Context:   Objective  Well appearing patient in no apparent distress; mood and affect are within normal limits. Perhaps 70% improvement in scaling erythema and fissuring of hands and feet.  Patient pleased with response and for now can live with the acetretin induced hair loss.      A focused examination was performed including hands and feet. Relevant physical exam findings are noted in the Assessment and Plan.   Assessment & Plan    Psoriasis  Check labs.  Continue acetretin.  Discussed reducing dose to 10 mg or 10 mg / 25 mg alternate daily.  May also add low-dose oral vitamin E.  Continue to use emollient on hands and feet.  Lipid Panel  CMP  Related Medications triamcinolone ointment (KENALOG) 0.1 % Apply topically.  clobetasol ointment (TEMOVATE) 0.05 % APPLY TWICE DAILY  clobetasol ointment (TEMOVATE) 0.05 % APPLY TWICE DAILY  acitretin (SORIATANE) 25 MG capsule TAKE ONE CAPSULE BY MOUTH DAILY      I, Lavonna Monarch, MD, have reviewed all documentation for this visit.  The documentation on 09/26/21 for the exam, diagnosis, procedures, and orders are all accurate and complete.

## 2021-10-15 LAB — COMPREHENSIVE METABOLIC PANEL
AG Ratio: 1.3 (calc) (ref 1.0–2.5)
ALT: 15 U/L (ref 6–29)
AST: 18 U/L (ref 10–35)
Albumin: 4.4 g/dL (ref 3.6–5.1)
Alkaline phosphatase (APISO): 61 U/L (ref 37–153)
BUN: 12 mg/dL (ref 7–25)
CO2: 26 mmol/L (ref 20–32)
Calcium: 9.8 mg/dL (ref 8.6–10.4)
Chloride: 103 mmol/L (ref 98–110)
Creat: 0.64 mg/dL (ref 0.50–1.05)
Globulin: 3.3 g/dL (calc) (ref 1.9–3.7)
Glucose, Bld: 93 mg/dL (ref 65–99)
Potassium: 3.5 mmol/L (ref 3.5–5.3)
Sodium: 139 mmol/L (ref 135–146)
Total Bilirubin: 0.2 mg/dL (ref 0.2–1.2)
Total Protein: 7.7 g/dL (ref 6.1–8.1)

## 2021-10-15 LAB — LIPID PANEL
Cholesterol: 173 mg/dL (ref <200)
HDL: 55 mg/dL (ref >=50)
LDL Cholesterol (Calc): 95 mg/dL (calc)
Non-HDL Cholesterol (Calc): 118 mg/dL (calc) (ref <130)
Total CHOL/HDL Ratio: 3.1 (calc) (ref <5.0)
Triglycerides: 129 mg/dL (ref <150)

## 2021-10-16 DIAGNOSIS — C801 Malignant (primary) neoplasm, unspecified: Secondary | ICD-10-CM

## 2021-10-16 HISTORY — DX: Malignant (primary) neoplasm, unspecified: C80.1

## 2021-10-18 ENCOUNTER — Telehealth: Payer: Self-pay

## 2021-10-18 NOTE — Telephone Encounter (Signed)
Labs to patient per ST ok to continue medication and recheck labs in 6 months patient will call back to make follow up

## 2021-10-18 NOTE — Telephone Encounter (Signed)
-----   Message from Lavonna Monarch, MD sent at 10/15/2021  5:04 PM EST ----- Labs are perfect to continue her medication, should not need recheck for 6 months.

## 2021-11-28 ENCOUNTER — Ambulatory Visit (INDEPENDENT_AMBULATORY_CARE_PROVIDER_SITE_OTHER): Payer: 59 | Admitting: Pulmonary Disease

## 2021-11-28 ENCOUNTER — Encounter: Payer: Self-pay | Admitting: Pulmonary Disease

## 2021-11-28 ENCOUNTER — Other Ambulatory Visit: Payer: Self-pay

## 2021-11-28 VITALS — BP 126/70 | HR 100 | Temp 98.2°F | Ht 66.0 in | Wt 144.2 lb

## 2021-11-28 DIAGNOSIS — R590 Localized enlarged lymph nodes: Secondary | ICD-10-CM

## 2021-11-28 DIAGNOSIS — J984 Other disorders of lung: Secondary | ICD-10-CM

## 2021-11-28 DIAGNOSIS — Z87891 Personal history of nicotine dependence: Secondary | ICD-10-CM | POA: Diagnosis not present

## 2021-11-28 DIAGNOSIS — R911 Solitary pulmonary nodule: Secondary | ICD-10-CM | POA: Diagnosis not present

## 2021-11-28 DIAGNOSIS — J432 Centrilobular emphysema: Secondary | ICD-10-CM

## 2021-11-28 DIAGNOSIS — J8410 Pulmonary fibrosis, unspecified: Secondary | ICD-10-CM

## 2021-11-28 MED ORDER — ALBUTEROL SULFATE HFA 108 (90 BASE) MCG/ACT IN AERS: 2.0000 | INHALATION_SPRAY | Freq: Four times a day (QID) | RESPIRATORY_TRACT | 2 refills | Status: DC | PRN

## 2021-11-28 MED ORDER — STIOLTO RESPIMAT 2.5-2.5 MCG/ACT IN AERS: 2.0000 | INHALATION_SPRAY | Freq: Every day | RESPIRATORY_TRACT | 0 refills | Status: DC

## 2021-11-28 NOTE — Progress Notes (Signed)
Synopsis: Referred in February 2023 for lung mass by Glenda Chroman, MD  Subjective:   PATIENT ID: Diane [name] GENDER: female DOB: 11/29/1955, MRN: [id number]  Chief Complaint  Patient presents with   Consult    Consult.     This is a 66 year old female, past medical history of C. difficile, COPD, hyperlipidemia, pneumonia.  Patient is a former smoker quit in 2019, half a pack a day for approximately 30 years.Patient had a CT scan of the chest completed on 11/23/2021.  Patient had a CT completed at [facility] which revealed a 2.9 cm partially cavitary mass within the superior segment of the right lower lobe concerning for primary bronchogenic carcinoma with a possible involved precarinal small lymph node.  There was also associated moderate emphysema and superimposed areas of pulmonary fibrosis.  Patient was referred to pulmonary for consideration of tissue biopsy and discuss next steps.   Past Medical History:  Diagnosis Date   Anxiety    Arthritis    fingers, left foot   C. difficile diarrhea 2015   COPD (chronic obstructive pulmonary disease) (HCC)    Depression    Eczema    H pylori ulcer    Headache(784.0)    Hypercholesterolemia    Kidney stones    20 years ago   Pneumonia    5 years ago   Seizures ([place])    2 years ago, "cluster of seizures" none since   Shingles      Family History  Problem Relation Age of Onset   Arthritis Mother    Asthma Mother    Depression Mother    Hyperlipidemia Mother    Varicose Veins Mother    Arthritis Father    Heart disease Father    Hyperlipidemia Father    Vision loss Father    Cancer Sister    COPD Sister    Early death Sister    Cancer Brother    Alcohol abuse Maternal Grandfather    Colon cancer Neg Hx      Past Surgical History:  Procedure Laterality Date   ABDOMINAL HYSTERECTOMY     ADENOIDECTOMY     APPENDECTOMY     CESAREAN SECTION     CHOLECYSTECTOMY     COLONOSCOPY  06/10/2012   RMR: Colonic  polyps -removed as described above.    KNEE ARTHROSCOPY  1973   right knee,  torn cart   LUMBAR DISC SURGERY  05/12/2015   L4 L5   LUMBAR LAMINECTOMY/DECOMPRESSION MICRODISCECTOMY Left 05/12/2015   Procedure: Left L4-5 Microdiscectomy;  Surgeon: Marybelle Killings, MD;  Location: [place];  Service: Orthopedics;  Laterality: Left;   TONSILLECTOMY      Social History   Socioeconomic History   Marital status: Married    Spouse name: [name]    Number of children: 3   Years of education: 12+   Highest education level: Not on file  Occupational History    Employer: [employer]  Tobacco Use   Smoking status: Former    Packs/day: 0.50    Years: 30.00    Pack years: 15.00    Types: Cigarettes    Quit date: 12/14/2017    Years since quitting: 3.9   Smokeless tobacco: Never   Tobacco comments:    5-10 cigarettes a day (plans to quit prior to this surgery 05/06/15)  Vaping Use   Vaping Use: Some days  Substance and Sexual Activity   Alcohol use: Yes    Alcohol/week: 21.0 standard  drinks    Types: 21 Shots of liquor per week    Comment: 3 per day   Drug use: No   Sexual activity: Not on file  Other Topics Concern   Not on file  Social History Narrative   The patient lives at home with husband [name].    Patient has 3 children.    Patient has he Associates Degree.    Patient is currently working.    Social Determinants of Health   Financial Resource Strain: Not on file  Food Insecurity: Not on file  Transportation Needs: Not on file  Physical Activity: Not on file  Stress: Not on file  Social Connections: Not on file  Intimate Partner Violence: Not on file     Allergies  Allergen Reactions   Other Other (See Comments)   Penicillins     Unknown type of reaction   Keppra [Levetiracetam]     irritability   Prozac [Fluoxetine] Rash     Outpatient Medications Prior to Visit  Medication Sig Dispense Refill   acitretin (SORIATANE) 25 MG capsule TAKE ONE CAPSULE  BY MOUTH DAILY (Patient taking differently: Every other day) 30 capsule 3   cetirizine (ZYRTEC) 10 MG tablet Take 10 mg by mouth at bedtime.     clobetasol ointment (TEMOVATE) 0.05 % APPLY TWICE DAILY 60 g 0   EPINEPHrine (AUVI-Q) 0.3 mg/0.3 mL IJ SOAJ injection Inject 0.3 mLs (0.3 mg total) into the muscle as needed for anaphylaxis. 2 each 1   fluticasone (FLONASE) 50 MCG/ACT nasal spray Place 2 sprays into both nostrils daily. 16 g 5   omeprazole (PRILOSEC) 20 MG capsule Take 1 capsule (20 mg total) by mouth 2 (two) times daily. (Needs to be seen before next refill) 60 capsule 0   azelastine (ASTELIN) 0.1 % nasal spray Place 1-2 sprays into both nostrils 2 (two) times daily as needed for rhinitis. 30 mL 5   clobetasol ointment (TEMOVATE) 0.05 % APPLY TWICE DAILY 60 g 0   clobetasol ointment (TEMOVATE) 0.05 % APPLY TWICE DAILY 60 g 1   clobetasol ointment (TEMOVATE) 0.05 % APPLY TWICE DAILY 60 g 5   dicyclomine (BENTYL) 10 MG capsule Take 1 capsule (10 mg total) by mouth 2 (two) times daily. 180 capsule 2   Olopatadine HCl (PATADAY) 0.2 % SOLN Place 1 drop into both eyes daily as needed. 2.5 mL 5   triamcinolone ointment (KENALOG) 0.1 % Apply topically.     venlafaxine XR (EFFEXOR-XR) 37.5 MG 24 hr capsule TAKE 1 CAPSULE EVERY MORNING WITH BREAKFAST 30 capsule 0   VITAMIN E PO Take 1 capsule by mouth daily.     zolpidem (AMBIEN) 10 MG tablet Take 1 tablet (10 mg total) by mouth at bedtime as needed. for sleep 30 tablet 2   No facility-administered medications prior to visit.    Review of Systems  Constitutional:  Negative for chills, fever, malaise/fatigue and weight loss.  HENT:  Negative for hearing loss, sore throat and tinnitus.   Eyes:  Negative for blurred vision and double vision.  Respiratory:  Positive for cough and shortness of breath. Negative for hemoptysis, sputum production, wheezing and stridor.   Cardiovascular:  Negative for chest pain, palpitations, orthopnea, leg swelling  and PND.  Gastrointestinal:  Negative for abdominal pain, constipation, diarrhea, heartburn, nausea and vomiting.  Genitourinary:  Negative for dysuria, hematuria and urgency.  Musculoskeletal:  Negative for joint pain and myalgias.  Skin:  Negative for itching and rash.  Neurological:  Negative for dizziness, tingling, weakness and headaches.  Endo/Heme/Allergies:  Negative for environmental allergies. Does not bruise/bleed easily.  Psychiatric/Behavioral:  Negative for depression. The patient is not nervous/anxious and does not have insomnia.   All other systems reviewed and are negative.   Objective:  Physical Exam Vitals reviewed.  Constitutional:      General: She is not in acute distress.    Appearance: She is well-developed.  HENT:     Head: Normocephalic and atraumatic.  Eyes:     General: No scleral icterus.    Conjunctiva/sclera: Conjunctivae normal.     Pupils: Pupils are equal, round, and reactive to light.  Neck:     Vascular: No JVD.     Trachea: No tracheal deviation.  Cardiovascular:     Rate and Rhythm: Normal rate and regular rhythm.     Heart sounds: Normal heart sounds. No murmur heard. Pulmonary:     Effort: Pulmonary effort is normal. No tachypnea, accessory muscle usage or respiratory distress.     Breath sounds: No stridor. No wheezing, rhonchi or rales.     Comments: Bibasilar crackles  Abdominal:     General: There is no distension.     Palpations: Abdomen is soft.     Tenderness: There is no abdominal tenderness.  Musculoskeletal:        General: No tenderness.     Cervical back: Neck supple.  Lymphadenopathy:     Cervical: No cervical adenopathy.  Skin:    General: Skin is warm and dry.     Capillary Refill: Capillary refill takes less than 2 seconds.     Findings: No rash.  Neurological:     Mental Status: She is alert and oriented to person, place, and time.  Psychiatric:        Behavior: Behavior normal.     Vitals:   11/28/21 1558   BP: 126/70  Pulse: 100  Temp: 98.2 F (36.8 C)  TempSrc: Oral  SpO2: 96%  Weight: 144 lb 3.2 oz (65.4 kg)  Height: 5\' 6"  (1.676 m)   96% on RA BMI Readings from Last 3 Encounters:  11/28/21 23.27 kg/m  06/15/20 24.13 kg/m  06/11/20 23.96 kg/m   Wt Readings from Last 3 Encounters:  11/28/21 144 lb 3.2 oz (65.4 kg)  06/15/20 145 lb (65.8 kg)  06/11/20 144 lb (65.3 kg)     CBC    Component Value Date/Time   WBC 6.6 06/11/2020 0959   WBC 6.9 05/06/2015 1438   RBC 4.31 06/11/2020 0959   RBC 4.28 05/06/2015 1438   HGB 14.1 06/11/2020 0959   HCT 42.1 06/11/2020 0959   PLT 338 06/11/2020 0959   MCV 98 (H) 06/11/2020 0959   MCH 32.7 06/11/2020 0959   MCH 32.2 05/06/2015 1438   MCHC 33.5 06/11/2020 0959   MCHC 33.7 05/06/2015 1438   RDW 12.2 06/11/2020 0959   LYMPHSABS 1.8 06/11/2020 0959   MONOABS 0.4 10/06/2012 2040   EOSABS 0.2 06/11/2020 0959   BASOSABS 0.0 06/11/2020 0959     Chest Imaging: 12/03/2021 CT chest: [facility]. CT imaging reviewed in PACS system. 2.9 cm right lower lobe superior segment lung nodule concerning for malignancy partially cavitary.  Associated small precarinal lymph node. The patient's images have been independently reviewed by me.    Pulmonary Functions Testing Results: No flowsheet data found.  FeNO:   Pathology:   Echocardiogram:   Heart Catheterization:     Assessment & Plan:     ICD-10-CM  1. Right lower lobe pulmonary nodule  R91.1     2. Mediastinal adenopathy  R59.0     3. Cavitary lesion of lung  J98.4     4. Former smoker  Z87.891     5. Centrilobular emphysema ([place])  J43.2     6. Pulmonary fibrosis ([place])  J84.10       Discussion:  This is a 66 year old female with a newly found 2.9 cm pulmonary nodule within the right lower lobe superior segment concerning for primary bronchogenic carcinoma, partially cavitary with small precarinal node involvement.  Referred today to discuss next steps regarding  tissue biopsy.  In addition her CT also found areas of pulmonary fibrosis and centrilobular emphysema.  She is a former smoker and has thankfully quit.  Plan: We will plan for robotic assisted navigational bronchoscopy with tissue sampling. Patient also need videobronchoscope endobronchial ultrasound and transbronchial needle aspiration for staging of the mediastinum due to the enlarged paratracheal node. Patient will also need a nuclear medicine pet image. She likely has COPD based on her radiographic findings. We will also give her samples of Stiolto, new prescription as well as a DME supply order for a new nebulizer with albuterol solution. We will tentatively schedule bronchoscopy to be complete in a few weeks after the PET scan is complete. Bronchoscopy to be scheduled on 12/20/2021.    Current Outpatient Medications:    acitretin (SORIATANE) 25 MG capsule, TAKE ONE CAPSULE BY MOUTH DAILY (Patient taking differently: Every other day), Disp: 30 capsule, Rfl: 3   cetirizine (ZYRTEC) 10 MG tablet, Take 10 mg by mouth at bedtime., Disp: , Rfl:    clobetasol ointment (TEMOVATE) 0.05 %, APPLY TWICE DAILY, Disp: 60 g, Rfl: 0   EPINEPHrine (AUVI-Q) 0.3 mg/0.3 mL IJ SOAJ injection, Inject 0.3 mLs (0.3 mg total) into the muscle as needed for anaphylaxis., Disp: 2 each, Rfl: 1   fluticasone (FLONASE) 50 MCG/ACT nasal spray, Place 2 sprays into both nostrils daily., Disp: 16 g, Rfl: 5   omeprazole (PRILOSEC) 20 MG capsule, Take 1 capsule (20 mg total) by mouth 2 (two) times daily. (Needs to be seen before next refill), Disp: 60 capsule, Rfl: 0  I spent 63 minutes dedicated to the care of this patient on the date of this encounter to include pre-visit review of records, face-to-face time with the patient discussing conditions above, post visit ordering of testing, clinical documentation with the electronic health record, making appropriate referrals as documented, and communicating necessary findings to  members of the patients care team.   Garner Nash, [facility] 11/28/2021 4:11 PM

## 2021-11-28 NOTE — H&P (View-Only) (Signed)
Synopsis: Referred in February 2023 for lung mass by Glenda Chroman, MD  Subjective:   PATIENT ID: Diane [name] GENDER: female DOB: Dec 16, 1955, MRN: [id number]  Chief Complaint  Patient presents with   Consult    Consult.     This is a 66 year old female, past medical history of C. difficile, COPD, hyperlipidemia, pneumonia.  Patient is a former smoker quit in 2019, half a pack a day for approximately 30 years.Patient had a CT scan of the chest completed on 11/23/2021.  Patient had a CT completed at [facility] which revealed a 2.9 cm partially cavitary mass within the superior segment of the right lower lobe concerning for primary bronchogenic carcinoma with a possible involved precarinal small lymph node.  There was also associated moderate emphysema and superimposed areas of pulmonary fibrosis.  Patient was referred to pulmonary for consideration of tissue biopsy and discuss next steps.   Past Medical History:  Diagnosis Date   Anxiety    Arthritis    fingers, left foot   C. difficile diarrhea 2015   COPD (chronic obstructive pulmonary disease) (HCC)    Depression    Eczema    H pylori ulcer    Headache(784.0)    Hypercholesterolemia    Kidney stones    20 years ago   Pneumonia    5 years ago   Seizures ([place])    2 years ago, "cluster of seizures" none since   Shingles      Family History  Problem Relation Age of Onset   Arthritis Mother    Asthma Mother    Depression Mother    Hyperlipidemia Mother    Varicose Veins Mother    Arthritis Father    Heart disease Father    Hyperlipidemia Father    Vision loss Father    Cancer Sister    COPD Sister    Early death Sister    Cancer Brother    Alcohol abuse Maternal Grandfather    Colon cancer Neg Hx      Past Surgical History:  Procedure Laterality Date   ABDOMINAL HYSTERECTOMY     ADENOIDECTOMY     APPENDECTOMY     CESAREAN SECTION     CHOLECYSTECTOMY     COLONOSCOPY  06/10/2012   RMR: Colonic  polyps -removed as described above.    KNEE ARTHROSCOPY  1973   right knee,  torn cart   LUMBAR DISC SURGERY  05/12/2015   L4 L5   LUMBAR LAMINECTOMY/DECOMPRESSION MICRODISCECTOMY Left 05/12/2015   Procedure: Left L4-5 Microdiscectomy;  Surgeon: Marybelle Killings, MD;  Location: [place];  Service: Orthopedics;  Laterality: Left;   TONSILLECTOMY      Social History   Socioeconomic History   Marital status: Married    Spouse name: [name]    Number of children: 3   Years of education: 12+   Highest education level: Not on file  Occupational History    Employer: [employer]  Tobacco Use   Smoking status: Former    Packs/day: 0.50    Years: 30.00    Pack years: 15.00    Types: Cigarettes    Quit date: 12/14/2017    Years since quitting: 3.9   Smokeless tobacco: Never   Tobacco comments:    5-10 cigarettes a day (plans to quit prior to this surgery 05/06/15)  Vaping Use   Vaping Use: Some days  Substance and Sexual Activity   Alcohol use: Yes    Alcohol/week: 21.0 standard  drinks    Types: 21 Shots of liquor per week    Comment: 3 per day   Drug use: No   Sexual activity: Not on file  Other Topics Concern   Not on file  Social History Narrative   The patient lives at home with husband [name].    Patient has 3 children.    Patient has he Associates Degree.    Patient is currently working.    Social Determinants of Health   Financial Resource Strain: Not on file  Food Insecurity: Not on file  Transportation Needs: Not on file  Physical Activity: Not on file  Stress: Not on file  Social Connections: Not on file  Intimate Partner Violence: Not on file     Allergies  Allergen Reactions   Other Other (See Comments)   Penicillins     Unknown type of reaction   Keppra [Levetiracetam]     irritability   Prozac [Fluoxetine] Rash     Outpatient Medications Prior to Visit  Medication Sig Dispense Refill   acitretin (SORIATANE) 25 MG capsule TAKE ONE CAPSULE  BY MOUTH DAILY (Patient taking differently: Every other day) 30 capsule 3   cetirizine (ZYRTEC) 10 MG tablet Take 10 mg by mouth at bedtime.     clobetasol ointment (TEMOVATE) 0.05 % APPLY TWICE DAILY 60 g 0   EPINEPHrine (AUVI-Q) 0.3 mg/0.3 mL IJ SOAJ injection Inject 0.3 mLs (0.3 mg total) into the muscle as needed for anaphylaxis. 2 each 1   fluticasone (FLONASE) 50 MCG/ACT nasal spray Place 2 sprays into both nostrils daily. 16 g 5   omeprazole (PRILOSEC) 20 MG capsule Take 1 capsule (20 mg total) by mouth 2 (two) times daily. (Needs to be seen before next refill) 60 capsule 0   azelastine (ASTELIN) 0.1 % nasal spray Place 1-2 sprays into both nostrils 2 (two) times daily as needed for rhinitis. 30 mL 5   clobetasol ointment (TEMOVATE) 0.05 % APPLY TWICE DAILY 60 g 0   clobetasol ointment (TEMOVATE) 0.05 % APPLY TWICE DAILY 60 g 1   clobetasol ointment (TEMOVATE) 0.05 % APPLY TWICE DAILY 60 g 5   dicyclomine (BENTYL) 10 MG capsule Take 1 capsule (10 mg total) by mouth 2 (two) times daily. 180 capsule 2   Olopatadine HCl (PATADAY) 0.2 % SOLN Place 1 drop into both eyes daily as needed. 2.5 mL 5   triamcinolone ointment (KENALOG) 0.1 % Apply topically.     venlafaxine XR (EFFEXOR-XR) 37.5 MG 24 hr capsule TAKE 1 CAPSULE EVERY MORNING WITH BREAKFAST 30 capsule 0   VITAMIN E PO Take 1 capsule by mouth daily.     zolpidem (AMBIEN) 10 MG tablet Take 1 tablet (10 mg total) by mouth at bedtime as needed. for sleep 30 tablet 2   No facility-administered medications prior to visit.    Review of Systems  Constitutional:  Negative for chills, fever, malaise/fatigue and weight loss.  HENT:  Negative for hearing loss, sore throat and tinnitus.   Eyes:  Negative for blurred vision and double vision.  Respiratory:  Positive for cough and shortness of breath. Negative for hemoptysis, sputum production, wheezing and stridor.   Cardiovascular:  Negative for chest pain, palpitations, orthopnea, leg swelling  and PND.  Gastrointestinal:  Negative for abdominal pain, constipation, diarrhea, heartburn, nausea and vomiting.  Genitourinary:  Negative for dysuria, hematuria and urgency.  Musculoskeletal:  Negative for joint pain and myalgias.  Skin:  Negative for itching and rash.  Neurological:  Negative for dizziness, tingling, weakness and headaches.  Endo/Heme/Allergies:  Negative for environmental allergies. Does not bruise/bleed easily.  Psychiatric/Behavioral:  Negative for depression. The patient is not nervous/anxious and does not have insomnia.   All other systems reviewed and are negative.   Objective:  Physical Exam Vitals reviewed.  Constitutional:      General: She is not in acute distress.    Appearance: She is well-developed.  HENT:     Head: Normocephalic and atraumatic.  Eyes:     General: No scleral icterus.    Conjunctiva/sclera: Conjunctivae normal.     Pupils: Pupils are equal, round, and reactive to light.  Neck:     Vascular: No JVD.     Trachea: No tracheal deviation.  Cardiovascular:     Rate and Rhythm: Normal rate and regular rhythm.     Heart sounds: Normal heart sounds. No murmur heard. Pulmonary:     Effort: Pulmonary effort is normal. No tachypnea, accessory muscle usage or respiratory distress.     Breath sounds: No stridor. No wheezing, rhonchi or rales.     Comments: Bibasilar crackles  Abdominal:     General: There is no distension.     Palpations: Abdomen is soft.     Tenderness: There is no abdominal tenderness.  Musculoskeletal:        General: No tenderness.     Cervical back: Neck supple.  Lymphadenopathy:     Cervical: No cervical adenopathy.  Skin:    General: Skin is warm and dry.     Capillary Refill: Capillary refill takes less than 2 seconds.     Findings: No rash.  Neurological:     Mental Status: She is alert and oriented to person, place, and time.  Psychiatric:        Behavior: Behavior normal.     Vitals:   11/28/21 1558   BP: 126/70  Pulse: 100  Temp: 98.2 F (36.8 C)  TempSrc: Oral  SpO2: 96%  Weight: 144 lb 3.2 oz (65.4 kg)  Height: 5\' 6"  (1.676 m)   96% on RA BMI Readings from Last 3 Encounters:  11/28/21 23.27 kg/m  06/15/20 24.13 kg/m  06/11/20 23.96 kg/m   Wt Readings from Last 3 Encounters:  11/28/21 144 lb 3.2 oz (65.4 kg)  06/15/20 145 lb (65.8 kg)  06/11/20 144 lb (65.3 kg)     CBC    Component Value Date/Time   WBC 6.6 06/11/2020 0959   WBC 6.9 05/06/2015 1438   RBC 4.31 06/11/2020 0959   RBC 4.28 05/06/2015 1438   HGB 14.1 06/11/2020 0959   HCT 42.1 06/11/2020 0959   PLT 338 06/11/2020 0959   MCV 98 (H) 06/11/2020 0959   MCH 32.7 06/11/2020 0959   MCH 32.2 05/06/2015 1438   MCHC 33.5 06/11/2020 0959   MCHC 33.7 05/06/2015 1438   RDW 12.2 06/11/2020 0959   LYMPHSABS 1.8 06/11/2020 0959   MONOABS 0.4 10/06/2012 2040   EOSABS 0.2 06/11/2020 0959   BASOSABS 0.0 06/11/2020 0959     Chest Imaging: 12/03/2021 CT chest: [facility]. CT imaging reviewed in PACS system. 2.9 cm right lower lobe superior segment lung nodule concerning for malignancy partially cavitary.  Associated small precarinal lymph node. The patient's images have been independently reviewed by me.    Pulmonary Functions Testing Results: No flowsheet data found.  FeNO:   Pathology:   Echocardiogram:   Heart Catheterization:     Assessment & Plan:     ICD-10-CM  1. Right lower lobe pulmonary nodule  R91.1     2. Mediastinal adenopathy  R59.0     3. Cavitary lesion of lung  J98.4     4. Former smoker  Z87.891     5. Centrilobular emphysema ([place])  J43.2     6. Pulmonary fibrosis ([place])  J84.10       Discussion:  This is a 66 year old female with a newly found 2.9 cm pulmonary nodule within the right lower lobe superior segment concerning for primary bronchogenic carcinoma, partially cavitary with small precarinal node involvement.  Referred today to discuss next steps regarding  tissue biopsy.  In addition her CT also found areas of pulmonary fibrosis and centrilobular emphysema.  She is a former smoker and has thankfully quit.  Plan: We will plan for robotic assisted navigational bronchoscopy with tissue sampling. Patient also need videobronchoscope endobronchial ultrasound and transbronchial needle aspiration for staging of the mediastinum due to the enlarged paratracheal node. Patient will also need a nuclear medicine pet image. She likely has COPD based on her radiographic findings. We will also give her samples of Stiolto, new prescription as well as a DME supply order for a new nebulizer with albuterol solution. We will tentatively schedule bronchoscopy to be complete in a few weeks after the PET scan is complete. Bronchoscopy to be scheduled on 12/20/2021.    Current Outpatient Medications:    acitretin (SORIATANE) 25 MG capsule, TAKE ONE CAPSULE BY MOUTH DAILY (Patient taking differently: Every other day), Disp: 30 capsule, Rfl: 3   cetirizine (ZYRTEC) 10 MG tablet, Take 10 mg by mouth at bedtime., Disp: , Rfl:    clobetasol ointment (TEMOVATE) 0.05 %, APPLY TWICE DAILY, Disp: 60 g, Rfl: 0   EPINEPHrine (AUVI-Q) 0.3 mg/0.3 mL IJ SOAJ injection, Inject 0.3 mLs (0.3 mg total) into the muscle as needed for anaphylaxis., Disp: 2 each, Rfl: 1   fluticasone (FLONASE) 50 MCG/ACT nasal spray, Place 2 sprays into both nostrils daily., Disp: 16 g, Rfl: 5   omeprazole (PRILOSEC) 20 MG capsule, Take 1 capsule (20 mg total) by mouth 2 (two) times daily. (Needs to be seen before next refill), Disp: 60 capsule, Rfl: 0  I spent 63 minutes dedicated to the care of this patient on the date of this encounter to include pre-visit review of records, face-to-face time with the patient discussing conditions above, post visit ordering of testing, clinical documentation with the electronic health record, making appropriate referrals as documented, and communicating necessary findings to  members of the patients care team.   Garner Nash, [place] [facility] 11/28/2021 4:11 PM

## 2021-11-28 NOTE — Patient Instructions (Addendum)
Thank you for visiting Dr. Valeta Harms at [facility]. Today we recommend the following:  Orders Placed This Encounter  Procedures   Procedural/ Surgical Case Request: ROBOTIC ASSISTED NAVIGATIONAL BRONCHOSCOPY, VIDEO BRONCHOSCOPY WITH ENDOBRONCHIAL ULTRASOUND   NM PET Image Initial (PI) Skull Base To Thigh (F-18 FDG)   CT Super D Chest Wo Contrast   Ambulatory referral to Pulmonology   Stiolto samples today  New nebulizer and albuterol solution  Albuterol Inhaler HFA  Return in about 29 days (around 12/27/2021) for with Eric Form, NP.    Please do your part to reduce the spread of COVID-19.

## 2021-11-29 ENCOUNTER — Encounter: Payer: Self-pay | Admitting: Pulmonary Disease

## 2021-11-29 ENCOUNTER — Telehealth: Payer: Self-pay | Admitting: Pulmonary Disease

## 2021-11-29 DIAGNOSIS — J984 Other disorders of lung: Secondary | ICD-10-CM | POA: Insufficient documentation

## 2021-11-29 DIAGNOSIS — R590 Localized enlarged lymph nodes: Secondary | ICD-10-CM | POA: Insufficient documentation

## 2021-11-29 DIAGNOSIS — R911 Solitary pulmonary nodule: Secondary | ICD-10-CM | POA: Insufficient documentation

## 2021-11-29 MED ORDER — ALBUTEROL SULFATE (2.5 MG/3ML) 0.083% IN NEBU: 2.5000 mg | INHALATION_SOLUTION | Freq: Four times a day (QID) | RESPIRATORY_TRACT | 12 refills | Status: DC | PRN

## 2021-11-29 NOTE — Telephone Encounter (Signed)
Patient dropped off disability paperwork 11/28/2021. Patient is currently approved through 12/06/2021, but needs the extension turned in by 12/19/2021. Emailed to [employer] for review.

## 2021-11-29 NOTE — Telephone Encounter (Signed)
I have pt scheduled for 3/7 at 11:00 at [facility].  Pt will have CT and PET on 2/28 at [facility].  They automatically send disc to [facility] when super D is ordered.  Pt will go for covid test on 3/3.  I spoke to pt & gave her appt info & sent her letter thru [place].

## 2021-11-29 NOTE — Telephone Encounter (Signed)
Dr. Valeta Harms, albuterol neb wasn't sent to pharmacy with albuterol hfa. Please confirm if the albuterol neb is 0.083% taken q6prn for ShOB and wheezing? Thanks.

## 2021-11-29 NOTE — Telephone Encounter (Signed)
Orders approved   Garner Nash, DO [facility] 11/29/2021 3:37 PM

## 2021-12-09 NOTE — Telephone Encounter (Signed)
Paperwork sent back to Dr. Valeta Harms for signature

## 2021-12-13 ENCOUNTER — Encounter (HOSPITAL_COMMUNITY)
Admission: RE | Admit: 2021-12-13 | Discharge: 2021-12-13 | Disposition: A | Discharge status: Home / Self Care | Payer: 59 | Source: Ambulatory Visit | Attending: Pulmonary Disease | Admitting: Pulmonary Disease

## 2021-12-13 ENCOUNTER — Encounter (HOSPITAL_COMMUNITY): Payer: Self-pay

## 2021-12-13 ENCOUNTER — Other Ambulatory Visit: Payer: Self-pay

## 2021-12-13 DIAGNOSIS — J984 Other disorders of lung: Secondary | ICD-10-CM

## 2021-12-13 DIAGNOSIS — R590 Localized enlarged lymph nodes: Secondary | ICD-10-CM

## 2021-12-13 DIAGNOSIS — R911 Solitary pulmonary nodule: Secondary | ICD-10-CM | POA: Diagnosis not present

## 2021-12-13 LAB — GLUCOSE, CAPILLARY: Glucose-Capillary: 100 mg/dL — ABNORMAL HIGH (ref 70–99)

## 2021-12-13 MED ORDER — FLUDEOXYGLUCOSE F - 18 (FDG) INJECTION
7.1400 | Freq: Once | INTRAVENOUS | Status: AC | PRN
  Administered 2021-12-13: 7.14 via INTRAVENOUS

## 2021-12-14 ENCOUNTER — Encounter: Payer: Self-pay | Admitting: Pulmonary Disease

## 2021-12-14 NOTE — Telephone Encounter (Signed)
Please advise on results of CT scan?  ?

## 2021-12-16 ENCOUNTER — Other Ambulatory Visit: Payer: Self-pay

## 2021-12-16 ENCOUNTER — Encounter (HOSPITAL_COMMUNITY): Payer: Self-pay | Admitting: Pulmonary Disease

## 2021-12-16 ENCOUNTER — Other Ambulatory Visit: Payer: Self-pay | Admitting: Pulmonary Disease

## 2021-12-16 LAB — SARS CORONAVIRUS 2 (TAT 6-24 HRS): SARS Coronavirus 2: NEGATIVE

## 2021-12-16 NOTE — Progress Notes (Signed)
DUE TO COVID-19 ONLY ONE VISITOR IS ALLOWED TO COME WITH YOU AND STAY IN THE WAITING ROOM ONLY DURING PRE OP AND PROCEDURE DAY OF SURGERY.  ? ?PCP - Jerene Bears MD ? ?EKG - DOS ? ?Anesthesia review: no ? ?STOP now taking any Aspirin (unless otherwise instructed by your surgeon), Aleve, Naproxen, Ibuprofen, Motrin, Advil, Goody's, BC's, all herbal medications, fish oil, and all vitamins.  ? ?Coronavirus Screening ?Covid test is scheduled on  ?Do you have any of the following symptoms:  ?Cough yes/no: No ?Fever (>100.4F)  yes/no: No ?Runny nose yes/no: No ?Sore throat yes/no: No ?Difficulty breathing/shortness of breath  yes/no: No ? ?Have you traveled in the last 14 days and where? yes/no: No ? ?

## 2021-12-19 NOTE — Telephone Encounter (Signed)
Paperwork has been completed, faxed, copy mailed to patient and copy placed in scan. ?

## 2021-12-20 ENCOUNTER — Encounter (HOSPITAL_COMMUNITY): Payer: Self-pay | Admitting: Pulmonary Disease

## 2021-12-20 ENCOUNTER — Ambulatory Visit (HOSPITAL_COMMUNITY): Payer: 59

## 2021-12-20 ENCOUNTER — Ambulatory Visit (HOSPITAL_COMMUNITY): Payer: 59 | Admitting: Anesthesiology

## 2021-12-20 ENCOUNTER — Ambulatory Visit (HOSPITAL_BASED_OUTPATIENT_CLINIC_OR_DEPARTMENT_OTHER): Payer: 59 | Admitting: Anesthesiology

## 2021-12-20 ENCOUNTER — Other Ambulatory Visit: Payer: Self-pay

## 2021-12-20 ENCOUNTER — Encounter (HOSPITAL_COMMUNITY)
Admission: RE | Disposition: A | Discharge status: Home / Self Care | Payer: Self-pay | Source: Home / Self Care | Attending: Pulmonary Disease

## 2021-12-20 ENCOUNTER — Ambulatory Visit (HOSPITAL_COMMUNITY)
Admission: RE | Admit: 2021-12-20 | Discharge: 2021-12-20 | Disposition: A | Discharge status: Home / Self Care | Payer: 59 | Attending: Pulmonary Disease | Admitting: Pulmonary Disease

## 2021-12-20 DIAGNOSIS — J984 Other disorders of lung: Secondary | ICD-10-CM | POA: Diagnosis not present

## 2021-12-20 DIAGNOSIS — R911 Solitary pulmonary nodule: Secondary | ICD-10-CM | POA: Diagnosis not present

## 2021-12-20 DIAGNOSIS — R590 Localized enlarged lymph nodes: Secondary | ICD-10-CM | POA: Insufficient documentation

## 2021-12-20 DIAGNOSIS — E785 Hyperlipidemia, unspecified: Secondary | ICD-10-CM | POA: Diagnosis not present

## 2021-12-20 DIAGNOSIS — Z8711 Personal history of peptic ulcer disease: Secondary | ICD-10-CM | POA: Diagnosis not present

## 2021-12-20 DIAGNOSIS — K219 Gastro-esophageal reflux disease without esophagitis: Secondary | ICD-10-CM | POA: Insufficient documentation

## 2021-12-20 DIAGNOSIS — Z09 Encounter for follow-up examination after completed treatment for conditions other than malignant neoplasm: Secondary | ICD-10-CM

## 2021-12-20 DIAGNOSIS — C3431 Malignant neoplasm of lower lobe, right bronchus or lung: Secondary | ICD-10-CM | POA: Diagnosis present

## 2021-12-20 DIAGNOSIS — Z419 Encounter for procedure for purposes other than remedying health state, unspecified: Secondary | ICD-10-CM

## 2021-12-20 DIAGNOSIS — I10 Essential (primary) hypertension: Secondary | ICD-10-CM

## 2021-12-20 DIAGNOSIS — Z8619 Personal history of other infectious and parasitic diseases: Secondary | ICD-10-CM | POA: Insufficient documentation

## 2021-12-20 DIAGNOSIS — Z8701 Personal history of pneumonia (recurrent): Secondary | ICD-10-CM | POA: Insufficient documentation

## 2021-12-20 DIAGNOSIS — J432 Centrilobular emphysema: Secondary | ICD-10-CM | POA: Insufficient documentation

## 2021-12-20 DIAGNOSIS — J8410 Pulmonary fibrosis, unspecified: Secondary | ICD-10-CM | POA: Diagnosis not present

## 2021-12-20 DIAGNOSIS — M1990 Unspecified osteoarthritis, unspecified site: Secondary | ICD-10-CM | POA: Diagnosis not present

## 2021-12-20 DIAGNOSIS — Z87891 Personal history of nicotine dependence: Secondary | ICD-10-CM | POA: Insufficient documentation

## 2021-12-20 HISTORY — PX: VIDEO BRONCHOSCOPY WITH RADIAL ENDOBRONCHIAL ULTRASOUND: SHX6849

## 2021-12-20 HISTORY — PX: FINE NEEDLE ASPIRATION: SHX5430

## 2021-12-20 HISTORY — PX: BRONCHIAL NEEDLE ASPIRATION BIOPSY: SHX5106

## 2021-12-20 HISTORY — DX: Essential (primary) hypertension: I10

## 2021-12-20 HISTORY — PX: BRONCHIAL BRUSHINGS: SHX5108

## 2021-12-20 HISTORY — PX: BRONCHIAL BIOPSY: SHX5109

## 2021-12-20 HISTORY — PX: VIDEO BRONCHOSCOPY WITH ENDOBRONCHIAL ULTRASOUND: SHX6177

## 2021-12-20 LAB — COMPREHENSIVE METABOLIC PANEL
ALT: 12 U/L (ref 0–44)
AST: 19 U/L (ref 15–41)
Albumin: 3.8 g/dL (ref 3.5–5.0)
Alkaline Phosphatase: 58 U/L (ref 38–126)
Anion gap: 10 (ref 5–15)
BUN: 12 mg/dL (ref 8–23)
CO2: 24 mmol/L (ref 22–32)
Calcium: 9.4 mg/dL (ref 8.9–10.3)
Chloride: 103 mmol/L (ref 98–111)
Creatinine, Ser: 0.69 mg/dL (ref 0.44–1.00)
GFR, Estimated: >60 mL/min (ref >60)
Glucose, Bld: 93 mg/dL (ref 70–99)
Potassium: 3.4 mmol/L — ABNORMAL LOW (ref 3.5–5.1)
Sodium: 137 mmol/L (ref 135–145)
Total Bilirubin: 0.3 mg/dL (ref 0.3–1.2)
Total Protein: 8 g/dL (ref 6.5–8.1)

## 2021-12-20 LAB — CBC
HCT: 37.1 % (ref 36.0–46.0)
Hemoglobin: 12.5 g/dL (ref 12.0–15.0)
MCH: 32.2 pg (ref 26.0–34.0)
MCHC: 33.7 g/dL (ref 30.0–36.0)
MCV: 95.6 fL (ref 80.0–100.0)
Platelets: 325 10*3/uL (ref 150–400)
RBC: 3.88 MIL/uL (ref 3.87–5.11)
RDW: 12.4 % (ref 11.5–15.5)
WBC: 8.6 10*3/uL (ref 4.0–10.5)
nRBC: 0 % (ref 0.0–0.2)

## 2021-12-20 SURGERY — BRONCHOSCOPY, WITH BIOPSY USING ELECTROMAGNETIC NAVIGATION
Anesthesia: General | Laterality: Right

## 2021-12-20 MED ORDER — PROPOFOL 10 MG/ML IV BOLUS
INTRAVENOUS | Status: DC | PRN
  Administered 2021-12-20: 150 mg via INTRAVENOUS
  Administered 2021-12-20: 50 mg via INTRAVENOUS

## 2021-12-20 MED ORDER — PHENYLEPHRINE 40 MCG/ML (10ML) SYRINGE FOR IV PUSH (FOR BLOOD PRESSURE SUPPORT)
PREFILLED_SYRINGE | INTRAVENOUS | Status: DC | PRN
  Administered 2021-12-20 (×2): 80 ug via INTRAVENOUS

## 2021-12-20 MED ORDER — PHENYLEPHRINE HCL-NACL 20-0.9 MG/250ML-% IV SOLN
INTRAVENOUS | Status: DC | PRN
Start: 2021-12-20 — End: 2021-12-20

## 2021-12-20 MED ORDER — ONDANSETRON HCL 4 MG/2ML IJ SOLN
INTRAMUSCULAR | Status: DC | PRN
  Administered 2021-12-20: 4 mg via INTRAVENOUS

## 2021-12-20 MED ORDER — PHENYLEPHRINE HCL-NACL 20-0.9 MG/250ML-% IV SOLN: INTRAVENOUS | Status: DC | PRN

## 2021-12-20 MED ORDER — LACTATED RINGERS IV SOLN: INTRAVENOUS | Status: DC

## 2021-12-20 MED ORDER — FENTANYL CITRATE (PF) 250 MCG/5ML IJ SOLN
INTRAMUSCULAR | Status: DC | PRN
  Administered 2021-12-20 (×2): 50 ug via INTRAVENOUS

## 2021-12-20 MED ORDER — PHENYLEPHRINE HCL-NACL 20-0.9 MG/250ML-% IV SOLN
INTRAVENOUS | Status: DC | PRN
  Administered 2021-12-20: 50 ug/min via INTRAVENOUS

## 2021-12-20 MED ORDER — LIDOCAINE 2% (20 MG/ML) 5 ML SYRINGE
INTRAMUSCULAR | Status: DC | PRN
  Administered 2021-12-20: 60 mg via INTRAVENOUS

## 2021-12-20 MED ORDER — ACETAMINOPHEN 500 MG PO TABS
1000.0000 mg | ORAL_TABLET | Freq: Once | ORAL | Status: AC
  Administered 2021-12-20: 1000 mg via ORAL
  Filled 2021-12-20: qty 2

## 2021-12-20 MED ORDER — CHLORHEXIDINE GLUCONATE 0.12 % MT SOLN
OROMUCOSAL | Status: AC
  Administered 2021-12-20: 15 mL via OROMUCOSAL
  Filled 2021-12-20: qty 15

## 2021-12-20 MED ORDER — ROCURONIUM BROMIDE 10 MG/ML (PF) SYRINGE
PREFILLED_SYRINGE | INTRAVENOUS | Status: DC | PRN
Start: 2021-12-20 — End: 2021-12-20
  Administered 2021-12-20: 50 mg via INTRAVENOUS

## 2021-12-20 MED ORDER — DEXAMETHASONE SODIUM PHOSPHATE 10 MG/ML IJ SOLN
INTRAMUSCULAR | Status: DC | PRN
  Administered 2021-12-20: 10 mg via INTRAVENOUS

## 2021-12-20 MED ORDER — SUGAMMADEX SODIUM 200 MG/2ML IV SOLN
INTRAVENOUS | Status: DC | PRN
  Administered 2021-12-20: 200 mg via INTRAVENOUS

## 2021-12-20 MED ORDER — CHLORHEXIDINE GLUCONATE 0.12 % MT SOLN
15.0000 mL | Freq: Once | OROMUCOSAL | Status: AC
  Filled 2021-12-20: qty 15

## 2021-12-20 SURGICAL SUPPLY — 30 items
BRUSH CYTOL CELLEBRITY 1.5X140 (MISCELLANEOUS) IMPLANT
CANISTER SUCT 3000ML PPV (MISCELLANEOUS) ×4 IMPLANT
CONT SPEC 4OZ CLIKSEAL STRL BL (MISCELLANEOUS) ×4 IMPLANT
COVER BACK TABLE 60X90IN (DRAPES) ×4 IMPLANT
COVER DOME SNAP 22 D (MISCELLANEOUS) ×4 IMPLANT
FORCEPS BIOP RJ4 1.8 (CUTTING FORCEPS) IMPLANT
GAUZE SPONGE 4X4 12PLY STRL (GAUZE/BANDAGES/DRESSINGS) ×4 IMPLANT
GLOVE BIO SURGEON STRL SZ7.5 (GLOVE) ×4 IMPLANT
GOWN STRL REUS W/ TWL LRG LVL3 (GOWN DISPOSABLE) ×3 IMPLANT
GOWN STRL REUS W/TWL LRG LVL3 (GOWN DISPOSABLE) ×1
KIT CLEAN ENDO COMPLIANCE (KITS) ×8 IMPLANT
KIT TURNOVER KIT B (KITS) ×4 IMPLANT
MARKER SKIN DUAL TIP RULER LAB (MISCELLANEOUS) ×4 IMPLANT
NDL EBUS SONO TIP PENTAX (NEEDLE) ×3 IMPLANT
NEEDLE EBUS SONO TIP PENTAX (NEEDLE) ×4 IMPLANT
NS IRRIG 1000ML POUR BTL (IV SOLUTION) ×4 IMPLANT
OIL SILICONE PENTAX (PARTS (SERVICE/REPAIRS)) ×4 IMPLANT
PAD ARMBOARD 7.5X6 YLW CONV (MISCELLANEOUS) ×8 IMPLANT
SOL ANTI FOG 6CC (MISCELLANEOUS) ×3 IMPLANT
SOLUTION ANTI FOG 6CC (MISCELLANEOUS) ×1
SYR 20CC LL (SYRINGE) ×8 IMPLANT
SYR 20ML ECCENTRIC (SYRINGE) ×8 IMPLANT
SYR 50ML SLIP (SYRINGE) IMPLANT
SYR 5ML LUER SLIP (SYRINGE) ×4 IMPLANT
TOWEL OR 17X24 6PK STRL BLUE (TOWEL DISPOSABLE) ×4 IMPLANT
TRAP SPECIMEN MUCOUS 40CC (MISCELLANEOUS) IMPLANT
TUBE CONNECTING 20X1/4 (TUBING) ×8 IMPLANT
UNDERPAD 30X30 (UNDERPADS AND DIAPERS) ×4 IMPLANT
VALVE DISPOSABLE (MISCELLANEOUS) ×4 IMPLANT
WATER STERILE IRR 1000ML POUR (IV SOLUTION) ×4 IMPLANT

## 2021-12-20 NOTE — Anesthesia Preprocedure Evaluation (Signed)
Anesthesia Evaluation  ?Patient identified by MRN, date of birth, ID band ?Patient awake ? ? ? ?Reviewed: ?Allergy & Precautions, NPO status , Patient's Chart, lab work & pertinent test results ? ?Airway ?Mallampati: II ? ?TM Distance: >3 FB ?Neck ROM: Full ? ? ? Dental ? ?(+) Dental Advisory Given ?  ?Pulmonary ?COPD,  COPD inhaler, former smoker,  ?  ?breath sounds clear to auscultation ? ? ? ? ? ? Cardiovascular ?hypertension, Pt. on medications ? ?Rhythm:Regular Rate:Normal ? ? ?  ?Neuro/Psych ?Seizures -,    ? GI/Hepatic ?Neg liver ROS, PUD, GERD  ,  ?Endo/Other  ?negative endocrine ROS ? Renal/GU ?negative Renal ROS  ? ?  ?Musculoskeletal ? ?(+) Arthritis ,  ? Abdominal ?  ?Peds ? Hematology ?negative hematology ROS ?(+)   ?Anesthesia Other Findings ? ? Reproductive/Obstetrics ? ?  ? ? ? ? ? ? ? ? ? ? ? ? ? ?  ?  ? ? ? ? ? ? ? ? ?Anesthesia Physical ?Anesthesia Plan ? ?ASA: 3 ? ?Anesthesia Plan: General  ? ?Post-op Pain Management: Tylenol PO (pre-op)* and Minimal or no pain anticipated  ? ?Induction: Intravenous ? ?PONV Risk Score and Plan: 3 and Dexamethasone, Ondansetron, Midazolam and Treatment may vary due to age or medical condition ? ?Airway Management Planned: Oral ETT ? ?Additional Equipment: None ? ?Intra-op Plan:  ? ?Post-operative Plan: Extubation in OR ? ?Informed Consent: I have reviewed the patients History and Physical, chart, labs and discussed the procedure including the risks, benefits and alternatives for the proposed anesthesia with the patient or authorized representative who has indicated his/her understanding and acceptance.  ? ? ? ?Dental advisory given ? ?Plan Discussed with: CRNA ? ?Anesthesia Plan Comments:   ? ? ? ? ? ? ?Anesthesia Quick Evaluation ? ?

## 2021-12-20 NOTE — Transfer of Care (Signed)
Immediate Anesthesia Transfer of Care Note ? ?Patient: Diane [name] ? ?Procedure(s) Performed: ROBOTIC ASSISTED NAVIGATIONAL BRONCHOSCOPY (Right) ?VIDEO BRONCHOSCOPY WITH ENDOBRONCHIAL ULTRASOUND (Bilateral) ?BRONCHIAL BRUSHINGS ?BRONCHIAL NEEDLE ASPIRATION BIOPSIES ?BRONCHIAL BIOPSIES ?RADIAL ENDOBRONCHIAL ULTRASOUND ?FINE NEEDLE ASPIRATION (FNA) LINEAR ? ?Patient Location: PACU ? ?Anesthesia Type:General ? ?Level of Consciousness: awake, alert , oriented and patient cooperative ? ?Airway & Oxygen Therapy: Patient Spontanous Breathing and Patient connected to nasal cannula oxygen ? ?Post-op Assessment: Report given to RN, Post -op Vital signs reviewed and stable and Patient moving all extremities X 4 ? ?Post vital signs: Reviewed and stable ? ?Last Vitals:  ?Vitals Value Taken Time  ?BP 123/62 12/20/21 1209  ?Temp    ?Pulse 73 12/20/21 1212  ?Resp 16 12/20/21 1212  ?SpO2 98 % 12/20/21 1212  ?Vitals shown include unvalidated device data. ? ?Last Pain:  ?Vitals:  ? 12/20/21 0954  ?TempSrc:   ?PainSc: 0-No pain  ?   ? ?  ? ?Complications: No notable events documented. ?

## 2021-12-20 NOTE — Discharge Instructions (Signed)
Flexible Bronchoscopy, Care After ?This sheet gives you information about how to care for yourself after your test. Your doctor may also give you more specific instructions. If you have problems or questions, contact your doctor. ?Follow these instructions at home: ?Eating and drinking ?Do not eat or drink anything (not even water) for 2 hours after your test, or until your numbing medicine (local anesthetic) wears off. ?When your numbness is gone and your cough and gag reflexes have come back, you may: ?Eat only soft foods. ?Slowly drink liquids. ?The day after the test, go back to your normal diet. ?Driving ?Do not drive for 24 hours if you were given a medicine to help you relax (sedative). ?Do not drive or use heavy machinery while taking prescription pain medicine. ?General instructions ? ?Take over-the-counter and prescription medicines only as told by your doctor. ?Return to your normal activities as told. Ask what activities are safe for you. ?Do not use any products that have nicotine or tobacco in them. This includes cigarettes and e-cigarettes. If you need help quitting, ask your doctor. ?Keep all follow-up visits as told by your doctor. This is important. It is very important if you had a tissue sample (biopsy) taken. ?Get help right away if: ?You have shortness of breath that gets worse. ?You get light-headed. ?You feel like you are going to pass out (faint). ?You have chest pain. ?You cough up: ?More than a little blood. ?More blood than before. ?Summary ?Do not eat or drink anything (not even water) for 2 hours after your test, or until your numbing medicine wears off. ?Do not use cigarettes. Do not use e-cigarettes. ?Get help right away if you have chest pain. ? ?This information is not intended to replace advice given to you by your health care provider. Make sure you discuss any questions you have with your health care provider. ?Document Released: 07/30/2009 Document Revised: 09/14/2017 Document  Reviewed: 10/20/2016 ?Elsevier Patient Education ? [place]. ? ?

## 2021-12-20 NOTE — Op Note (Signed)
Video Bronchoscopy with Robotic Assisted Bronchoscopic Navigation  ?Video Bronchoscopy with Endobronchial Ultrasound Procedure Note ? ?Date of Operation: 12/20/2021  ? ?Pre-op Diagnosis: Right lower lobe mass, adenopathy ? ?Post-op Diagnosis: Right lower lobe mass, adenopathy ? ?Surgeon: Garner Nash, DO ? ?Assistants: None  ? ?Anesthesia: General endotracheal anesthesia ? ?Operation: Flexible video fiberoptic bronchoscopy with robotic assistance and biopsies. ? ?Estimated Blood Loss: Minimal ? ?Complications: None ? ?Indications and History: ?Diane [name] is a 66 y.o. female with history of right lower lobe mass, adenopathy. The risks, benefits, complications, treatment options and expected outcomes were discussed with the patient.  The possibilities of pneumothorax, pneumonia, reaction to medication, pulmonary aspiration, perforation of a viscus, bleeding, failure to diagnose a condition and creating a complication requiring transfusion or operation were discussed with the patient who freely signed the consent.   ? ?Description of Procedure: ?The patient was seen in the Preoperative Area, was examined and was deemed appropriate to proceed.  The patient was taken to [facility] endoscopy room 3, identified as Diane [name] and the procedure verified as Flexible Video Fiberoptic Bronchoscopy.  A Time Out was held and the above information confirmed.  ? ?Prior to the date of the procedure a high-resolution CT scan of the chest was performed. Utilizing ION software program a virtual tracheobronchial tree was generated to allow the creation of distinct navigation pathways to the patient's parenchymal abnormalities. After being taken to the operating room general anesthesia was initiated and the patient  was orally intubated. The video fiberoptic bronchoscope was introduced via the endotracheal tube and a general inspection was performed which showed normal right and left lung anatomy, aspiration of the bilateral  mainstems was completed to remove any remaining secretions. Robotic catheter inserted into patient's endotracheal tube.  ? ?Target #1 right lower lobe mass, adenopathy: ?The distinct navigation pathways prepared prior to this procedure were then utilized to navigate to patient's lesion identified on CT scan. The robotic catheter was secured into place and the vision probe was withdrawn.  Lesion location was approximated using fluoroscopy and radial endobronchial ultrasound for peripheral targeting. Under fluoroscopic guidance transbronchial needle brushings, transbronchial needle biopsies, and transbronchial forceps biopsies were performed to be sent for cytology and pathology.  ? ?Target #2 Right hilar lymph node, station 10 R: ?The standard scope was then withdrawn and the endobronchial ultrasound was used to identify and characterize the peritracheal, hilar and bronchial lymph nodes. Inspection showed enlarged right hilar lymph node, no clear visible nodal structure within the subcarinal space, predominantly connective tissue. Using real-time ultrasound guidance Wang needle biopsies were take from Station 10 R nodes and were sent for cytology. The patient tolerated the procedure well without apparent complications. There was no significant blood loss. The bronchoscope was withdrawn. Anesthesia was reversed and the patient was taken to the PACU for recovery.  ? ?At the end of the procedure a general airway inspection was performed and there was no evidence of active bleeding. The bronchoscope was removed.  The patient tolerated the procedure well. There was no significant blood loss and there were no obvious complications. A post-procedural chest x-ray is pending. ? ?Samples Target #1: ?1. Transbronchial needle brushings from right lower lobe ?2. Transbronchial Wang needle biopsies from right lower lobe ?3. Transbronchial forceps biopsies from right lower lobe ? ?Samples Target #2: ?1. Wang needle biopsies from  10R node ? ?Plans:  ?The patient will be discharged from the PACU to home when recovered from anesthesia and after chest x-ray is reviewed.  We will review the cytology, pathology results with the patient when they become available. Outpatient followup will be with Diane Graves Donn Zanetti, DO. ? ?Garner Nash, DO ?[facility] ?12/20/2021 12:16 PM    ? ?

## 2021-12-20 NOTE — Interval H&P Note (Signed)
History and Physical Interval Note: ? ?12/20/2021 ?10:51 AM ? ?Diane [name]  has presented today for surgery, with the diagnosis of lung mass.  The various methods of treatment have been discussed with the patient and family. After consideration of risks, benefits and other options for treatment, the patient has consented to  Procedure(s) with comments: ?ROBOTIC ASSISTED NAVIGATIONAL BRONCHOSCOPY (Right) - ION w/ CIOS ?VIDEO BRONCHOSCOPY WITH ENDOBRONCHIAL ULTRASOUND (Bilateral) as a surgical intervention.  The patient's history has been reviewed, patient examined, no change in status, stable for surgery.  I have reviewed the patient's chart and labs.  Questions were answered to the patient's satisfaction.   ? ? ?Octavio Graves Sylus Stgermain ? ? ?

## 2021-12-20 NOTE — Anesthesia Procedure Notes (Addendum)
Procedure Name: Intubation ?Date/Time: 12/20/2021 11:03 AM ?Performed by: Annamary Carolin, CRNA ?Pre-anesthesia Checklist: Patient identified, Emergency Drugs available, Suction available and Patient being monitored ?Patient Re-evaluated:Patient Re-evaluated prior to induction ?Oxygen Delivery Method: Circle System Utilized ?Preoxygenation: Pre-oxygenation with 100% oxygen ?Induction Type: IV induction ?Ventilation: Mask ventilation without difficulty ?Laryngoscope Size: Glidescope and 3 ?Grade View: Grade III ?Tube type: Oral ?Tube size: 8.0 mm ?Number of attempts: 1 ?Airway Equipment and Method: Stylet, Bougie stylet, Video-laryngoscopy and Fiberoptic brochoscope ?Placement Confirmation: ETT inserted through vocal cords under direct vision, positive ETCO2 and breath sounds checked- equal and bilateral ?Secured at: 22 cm ?Tube secured with: Tape ?Dental Injury: Teeth and Oropharynx as per pre-operative assessment  ?Comments: Attempts x3 for airway including devices seen above. Successful pass was done by MDA with CRNA and proceduralist, Glidescope with ETT over bronch. No trauma noted. Easy bag between attempts. ?Unsuccessful attempts x2 due to small mouth opening, anterior airway and 8.5 ETT too large to pass. Successful 3rd attempt with glidescope used to open airway with ETT advanced off pulmonologists bronch. Recommend rigid stylet and glidescope on future attempts with appropriate sized tube or fiberoptic intubation. Pt was easy mask the entire time. ? ? ? ? ?

## 2021-12-22 ENCOUNTER — Encounter (HOSPITAL_COMMUNITY): Payer: Self-pay | Admitting: Pulmonary Disease

## 2021-12-22 LAB — CYTOLOGY - NON PAP

## 2021-12-23 NOTE — Anesthesia Postprocedure Evaluation (Signed)
Anesthesia Post Note ? ?Patient: Diane [name] ? ?Procedure(s) Performed: ROBOTIC ASSISTED NAVIGATIONAL BRONCHOSCOPY (Right) ?VIDEO BRONCHOSCOPY WITH ENDOBRONCHIAL ULTRASOUND (Bilateral) ?BRONCHIAL BRUSHINGS ?BRONCHIAL NEEDLE ASPIRATION BIOPSIES ?BRONCHIAL BIOPSIES ?RADIAL ENDOBRONCHIAL ULTRASOUND ?FINE NEEDLE ASPIRATION (FNA) LINEAR ? ?  ? ?Patient location during evaluation: PACU ?Anesthesia Type: General ?Level of consciousness: awake and alert ?Pain management: pain level controlled ?Vital Signs Assessment: post-procedure vital signs reviewed and stable ?Respiratory status: spontaneous breathing, nonlabored ventilation, respiratory function stable and patient connected to nasal cannula oxygen ?Cardiovascular status: blood pressure returned to baseline and stable ?Postop Assessment: no apparent nausea or vomiting ?Anesthetic complications: no ? ? ?No notable events documented. ? ?Last Vitals:  ?Vitals:  ? 12/20/21 1240 12/20/21 1255  ?BP: 113/65 117/65  ?Pulse: 67 69  ?Resp: 14 14  ?Temp:  36.7 ?C  ?SpO2: 91% 92%  ?  ?Last Pain:  ?Vitals:  ? 12/20/21 1240  ?TempSrc:   ?PainSc: 0-No pain  ? ? ?  ?  ?  ?  ?  ?  ? ?Suzette Battiest E ? ? ? ? ?

## 2021-12-26 NOTE — Progress Notes (Unsigned)
History of Present Illness Diane [name] is a 66 y.o. female with ***   12/26/2021  Test Results: PET scan 12/13/2021 Intensely hypermetabolic mass in the superior segment of the right lower lobe consistent with bronchogenic carcinoma. In addition, there is a hypermetabolic right hilar lymph node. No other definite signs of metastatic disease. By this examination, findings are most consistent with stage II B (T2aN1) disease.  CBC Latest Ref Rng & Units 12/20/2021 06/11/2020 12/03/2019  WBC 4.0 - 10.5 K/uL 8.6 6.6 7.2  Hemoglobin 12.0 - 15.0 g/dL 12.5 14.1 13.0  Hematocrit 36.0 - 46.0 % 37.1 42.1 37.3  Platelets 150 - 400 K/uL 325 338 304    BMP Latest Ref Rng & Units 12/20/2021 10/14/2021 12/03/2019  Glucose 70 - 99 mg/dL 93 93 98  BUN 8 - 23 mg/dL 12 12 14   Creatinine 0.44 - 1.00 mg/dL 0.69 0.64 0.63  BUN/Creat Ratio 6 - 22 (calc) - NOT APPLICABLE 22  Sodium 093 - 145 mmol/L 137 139 139  Potassium 3.5 - 5.1 mmol/L 3.4(L) 3.5 4.2  Chloride 98 - 111 mmol/L 103 103 102  CO2 22 - 32 mmol/L 24 26 24   Calcium 8.9 - 10.3 mg/dL 9.4 9.8 9.5    BNP No results found for: BNP  ProBNP No results found for: PROBNP  PFT No results found for: FEV1PRE, FEV1POST, FVCPRE, FVCPOST, TLC, DLCOUNC, PREFEV1FVCRT, PSTFEV1FVCRT  NM PET Image Initial (PI) Skull Base To Thigh (F-18 FDG)  Result Date: 12/14/2021 CLINICAL DATA:  Initial treatment strategy for cavitary right lower lobe lung mass. EXAM: NUCLEAR MEDICINE PET SKULL BASE TO THIGH TECHNIQUE: 7.14 mCi F-18 FDG was injected intravenously. Full-ring PET imaging was performed from the skull base to thigh after the radiotracer. CT data was obtained and used for attenuation correction and anatomic localization. Fasting blood glucose: 100 mg/dl COMPARISON:  Chest CT today and 11/23/2021 FINDINGS: Mediastinal blood pool activity: SUV max 2.1 NECK: No hypermetabolic cervical lymph nodes are identified.There are no lesions of the pharyngeal mucosal  space. Incidental CT findings: Bilateral carotid atherosclerosis. CHEST: The cavitary mass in the superior segment of the right lower lobe is intensely hypermetabolic with an SUV max of 24.4. This mass measures 3.4 x 3.0 cm. In addition, there is hypermetabolic activity in the right hilum consistent with a nodal metastasis (SUV max 9.9). No other suspicious nodal or pulmonary metabolic activity. Mildly prominent precarinal lymph nodes are not significantly hypermetabolic (greatest SUV max 3.1), likely reactive. Incidental CT findings: Deferred to chest CT same date. ABDOMEN/PELVIS: There is no hypermetabolic activity within the liver, adrenal glands, spleen or pancreas. Nonspecific mildly hypermetabolic inguinal lymph nodes bilaterally (SUV max 3.2 on the left). No other hypermetabolic and lymph nodes. Prominent activity throughout the colon, nonspecific and within physiologic limits. No focal bowel abnormality identified. Incidental CT findings: Previous cholecystectomy. Aortic and branch vessel atherosclerosis. SKELETON: There is no hypermetabolic activity to suggest osseous metastatic disease. Incidental CT findings: none IMPRESSION: 1. Intensely hypermetabolic mass in the superior segment of the right lower lobe consistent with bronchogenic carcinoma. In addition, there is a hypermetabolic right hilar lymph node. No other definite signs of metastatic disease. By this examination, findings are most consistent with stage II B (T2aN1) disease. 2. Nonspecific mildly hypermetabolic inguinal lymph nodes bilaterally. These are unlikely reflect metastases and are probably reactive. Correlate clinically. 3. Severe chronic lung disease with emphysema and fibrosis. Please refer to previous and concurrent chest CT reports. 4. Coronary and aortic atherosclerosis (ICD10-I70.0). Emphysema (ICD10-J43.9). Electronically  Signed   By: Richardean Sale M.D.   On: 12/14/2021 10:58   DG CHEST PORT 1 VIEW  Result Date:  12/20/2021 CLINICAL DATA:  Status post right lower lobe bronchoscopy EXAM: PORTABLE CHEST 1 VIEW COMPARISON:  12/13/2021 FINDINGS: Stable cardiomediastinal contours. Aortic atherosclerosis. Chronically coarsened interstitial markings bilaterally. Subtle masslike opacity in the right mid lung compatible with known cavitary mass. No large pleural fluid collection. No pneumothorax. IMPRESSION: No pneumothorax status post bronchoscopy. Electronically Signed   By: Davina Poke D.O.   On: 12/20/2021 12:55   CT Super D Chest Wo Contrast  Result Date: 12/14/2021 CLINICAL DATA:  Right lower lobe pulmonary nodule. Mid chest pain with cough and weight loss. History of pneumonia. EXAM: CT CHEST WITHOUT CONTRAST TECHNIQUE: Multidetector CT imaging of the chest was performed using thin slice collimation for electromagnetic bronchoscopy planning purposes, without intravenous contrast. RADIATION DOSE REDUCTION: This exam was performed according to the departmental dose-optimization program which includes automated exposure control, adjustment of the mA and/or kV according to patient size and/or use of iterative reconstruction technique. COMPARISON:  Chest CTA 08/28/2014 and chest CT 11/23/2021. Radiographs 11/14/2021. FINDINGS: Cardiovascular: Atherosclerosis of the aorta, great vessels and coronary arteries. There is mild central enlargement of the pulmonary arteries consistent with pulmonary arterial hypertension. The heart size is normal. There is no pericardial effusion. Mediastinum/Nodes: Ill-defined enlargement of the right hilum corresponding with hypermetabolic activity on concurrent PET-CT, consistent with a nodal metastasis. Stable precarinal nodes measuring 9 mm on image 24/2. The thyroid gland, trachea and esophagus demonstrate no significant findings. Lungs/Pleura: There is no pleural effusion. Severe centrilobular and paraseptal emphysema again noted with scattered subpleural reticulation and traction  bronchiectasis. The dominant cavitary mass in the superior segment of the right lower lobe measures 3.4 x 3.0 cm on image 67/7. No other suspicious nodules demonstrated. Upper abdomen: The visualized upper abdomen appears stable, without significant findings. Musculoskeletal/Chest wall: There is no chest wall mass or suspicious osseous finding. Mild thoracic spondylosis. IMPRESSION: 1. Imaging for bronchoscopic planning and guidance. 2. Known cavitary mass in the superior segment of the right lower lobe is not significantly changed from recent chest CT and consistent with bronchogenic carcinoma. See concurrent PET-CT for further details. 3. Coronary and aortic atherosclerosis (ICD10-I70.0). Emphysema (ICD10-J43.9). Electronically Signed   By: Richardean Sale M.D.   On: 12/14/2021 10:42   DG C-ARM BRONCHOSCOPY  Result Date: 12/20/2021 C-ARM BRONCHOSCOPY: Fluoroscopy was utilized by the requesting physician.  No radiographic interpretation.     Past medical hx Past Medical History:  Diagnosis Date   Anxiety    Arthritis    fingers, left foot   C. difficile diarrhea 2015   COPD (chronic obstructive pulmonary disease) (HCC)    Depression    Eczema    H pylori ulcer    Headache(784.0)    Hypercholesterolemia    Hypertension    Kidney stones    20 years ago   Pneumonia    5 years ago   Seizures ([place])    2 years ago, "cluster of seizures" none since   Shingles      Social History   Tobacco Use   Smoking status: Former    Packs/day: 0.50    Years: 30.00    Pack years: 15.00    Types: Cigarettes    Quit date: 12/14/2017    Years since quitting: 4.0   Smokeless tobacco: Never   Tobacco comments:    5-10 cigarettes a day (plans to quit prior to  this surgery 05/06/15)  Vaping Use   Vaping Use: Some days  Substance Use Topics   Alcohol use: Yes    Alcohol/week: 21.0 standard drinks    Types: 21 Shots of liquor per week    Comment: 3 per day   Drug use: No    Diane [name]  reports that she quit smoking about 4 years ago. Her smoking use included cigarettes. She has a 15.00 pack-year smoking history. She has never used smokeless tobacco. She reports current alcohol use of about 21.0 standard drinks per week. She reports that she does not use drugs.  Tobacco Cessation: Counseling given: Not Answered Tobacco comments: 5-10 cigarettes a day (plans to quit prior to this surgery 05/06/15)   Past surgical hx, Family hx, Social hx all reviewed.  Current Outpatient Medications on File Prior to Visit  Medication Sig   acitretin (SORIATANE) 25 MG capsule TAKE ONE CAPSULE BY MOUTH DAILY (Patient taking differently: Take 25 mg by mouth every other day.)   albuterol (PROVENTIL) (2.5 MG/3ML) 0.083% nebulizer solution Take 3 mLs (2.5 mg total) by nebulization every 6 (six) hours as needed for wheezing or shortness of breath.   albuterol (VENTOLIN HFA) 108 (90 Base) MCG/ACT inhaler Inhale 2 puffs into the lungs every 6 (six) hours as needed for wheezing or shortness of breath.   cetirizine (ZYRTEC) 10 MG tablet Take 10 mg by mouth at bedtime.   clobetasol ointment (TEMOVATE) 0.05 % APPLY TWICE DAILY (Patient taking differently: 1 application. daily as needed (Skin condition).)   EPINEPHrine (AUVI-Q) 0.3 mg/0.3 mL IJ SOAJ injection Inject 0.3 mLs (0.3 mg total) into the muscle as needed for anaphylaxis.   fluticasone (FLONASE) 50 MCG/ACT nasal spray Place 2 sprays into both nostrils daily. (Patient taking differently: Place 2 sprays into both nostrils daily as needed for allergies.)   hydrochlorothiazide (MICROZIDE) 12.5 MG capsule Take 12.5 mg by mouth daily.   nicotine (NICODERM CQ - DOSED IN MG/24 HR) 7 mg/24hr patch Place 7 mg onto the skin daily.   omeprazole (PRILOSEC) 20 MG capsule Take 1 capsule (20 mg total) by mouth 2 (two) times daily. (Needs to be seen before next refill)   rosuvastatin (CRESTOR) 10 MG tablet Take 10 mg by mouth at bedtime.   Tiotropium  Bromide-Olodaterol (STIOLTO RESPIMAT) 2.5-2.5 MCG/ACT AERS Inhale 2 puffs into the lungs daily.   No current facility-administered medications on file prior to visit.     Allergies  Allergen Reactions   Alpha-Gal     Other reaction(s): Abdominal Pain   Other Other (See Comments)   Penicillins     Unknown type of reaction   Keppra [Levetiracetam]     irritability   Prozac [Fluoxetine] Rash    Review Of Systems:  Constitutional:   No  weight loss, night sweats,  Fevers, chills, fatigue, or  lassitude.  HEENT:   No headaches,  Difficulty swallowing,  Tooth/dental problems, or  Sore throat,                No sneezing, itching, ear ache, nasal congestion, post nasal drip,   CV:  No chest pain,  Orthopnea, PND, swelling in lower extremities, anasarca, dizziness, palpitations, syncope.   GI  No heartburn, indigestion, abdominal pain, nausea, vomiting, diarrhea, change in bowel habits, loss of appetite, bloody stools.   Resp: No shortness of breath with exertion or at rest.  No excess mucus, no productive cough,  No non-productive cough,  No coughing up of blood.  No change in  color of mucus.  No wheezing.  No chest wall deformity  Skin: no rash or lesions.  GU: no dysuria, change in color of urine, no urgency or frequency.  No flank pain, no hematuria   MS:  No joint pain or swelling.  No decreased range of motion.  No back pain.  Psych:  No change in mood or affect. No depression or anxiety.  No memory loss.   Vital Signs There were no vitals taken for this visit.   Physical Exam:  General- No distress,  A&Ox3 ENT: No sinus tenderness, TM clear, pale nasal mucosa, no oral exudate,no post nasal drip, no LAN Cardiac: S1, S2, regular rate and rhythm, no murmur Chest: No wheeze/ rales/ dullness; no accessory muscle use, no nasal flaring, no sternal retractions Abd.: Soft Non-tender Ext: No clubbing cyanosis, edema Neuro:  normal strength Skin: No rashes, warm and dry Psych:  normal mood and behavior   Assessment/Plan  No problem-specific Assessment & Plan notes found for this encounter.    Magdalen Spatz, NP 12/26/2021  3:16 PM

## 2021-12-27 ENCOUNTER — Telehealth: Payer: Self-pay | Admitting: Internal Medicine

## 2021-12-27 ENCOUNTER — Other Ambulatory Visit: Payer: Self-pay

## 2021-12-27 ENCOUNTER — Encounter: Payer: Self-pay | Admitting: Acute Care

## 2021-12-27 ENCOUNTER — Ambulatory Visit (INDEPENDENT_AMBULATORY_CARE_PROVIDER_SITE_OTHER): Payer: 59 | Admitting: Acute Care

## 2021-12-27 VITALS — BP 104/62 | HR 85 | Temp 98.1°F | Ht 66.0 in | Wt 141.0 lb

## 2021-12-27 DIAGNOSIS — J449 Chronic obstructive pulmonary disease, unspecified: Secondary | ICD-10-CM

## 2021-12-27 DIAGNOSIS — C3490 Malignant neoplasm of unspecified part of unspecified bronchus or lung: Secondary | ICD-10-CM

## 2021-12-27 NOTE — Patient Instructions (Addendum)
It is good to see you today.  ?I have referred you to [facility] medical oncology and radiation oncology. ?You should get a call to schedule both appointments. ?We will do a therapeutic trial with the [place]. ?2 puffs once daily in the morning.  ?Rinse mouth after use.  ?Let me know if you like it, and we will send in a prescription. ?If you do not find it helps, just stop.  ?Albuterol neds as needed for shortness of breath or wheezing. ?Follow up in 6 months with Judson Roch to evaluate for COPD ( We may order PFT's)  ?Call if you need [place] sooner.  ?Call if you would like any additional referrals made.  ?Please contact office for sooner follow up if symptoms do not improve or worsen or seek emergency care   ?

## 2021-12-27 NOTE — Telephone Encounter (Signed)
Scheduled appt per 3/14 referral. Pt is aware of appt date and time. Pt is aware to arrive 15 mins prior to appt time and to bring and updated insurance card. Pt is aware of appt location.   ?

## 2021-12-28 NOTE — Progress Notes (Signed)
Thoracic Location of Tumor / Histology: Right Lower Lobe Lung ? ?Patient presented  ? ?EBUS 12/20/2021: ? ?PET 12/13/2021: Intensely hypermetabolic mass in the superior segment of the right lower lobe consistent with bronchogenic carcinoma. In addition, there is a hypermetabolic right hilar lymph node. No other definite signs of metastatic disease. ? ?CT Chest 11/23/2021: The dominant cavitary mass in the superior segment of the right lower lobe measures 3.4 x 3.0 ?cm on image 67/7. No other suspicious nodules demonstrated. ? ? ?Biopsies of Right Lower Lobe and Lymph node 12/20/2021 ? ? ? ? ?Tobacco/Marijuana/Snuff/ETOH use: Former Smoker, quit about 4 years ago. ? ?Past/Anticipated interventions by Pulmonary, if any: ?Eric Form NP 12/27/2021 ?-I have referred you to [facility] medical oncology and radiation oncology. ? ? ?Past/Anticipated interventions by cardiothoracic surgery, if any:  ? ?Past/Anticipated interventions by medical oncology, if any:  ?Dr. Julien Nordmann 01/05/2022 ? ? ?Signs/Symptoms ?Weight changes, if any: Has lost about 10 pounds since December 2022. ?Respiratory complaints, if any: Notes SOB with increased activities. " Everything seems like more of an effort" ?Hemoptysis, if any: Productive cough with greenish phlegm, since biopsy has seen rust colored sputum.  Denies hemoptysis since bronchoscopy. ?Pain issues, if any: No ? ?SAFETY ISSUES: ?Prior radiation? No ?Pacemaker/ICD?  No ?Possible current pregnancy? Hysterectomy ?Is the patient on methotrexate? No ? ?Current Complaints / other details:   ?

## 2021-12-29 ENCOUNTER — Ambulatory Visit
Admission: RE | Admit: 2021-12-29 | Discharge: 2021-12-29 | Disposition: A | Discharge status: Home / Self Care | Payer: 59 | Source: Ambulatory Visit | Attending: Radiation Oncology | Admitting: Radiation Oncology

## 2021-12-29 ENCOUNTER — Encounter: Payer: Self-pay | Admitting: Radiation Oncology

## 2021-12-29 ENCOUNTER — Other Ambulatory Visit: Payer: Self-pay

## 2021-12-29 ENCOUNTER — Telehealth: Payer: Self-pay | Admitting: Acute Care

## 2021-12-29 ENCOUNTER — Other Ambulatory Visit: Payer: Self-pay | Admitting: *Deleted

## 2021-12-29 VITALS — BP 115/71 | HR 81 | Temp 97.6°F | Resp 20 | Ht 66.0 in | Wt 137.6 lb

## 2021-12-29 DIAGNOSIS — Z87442 Personal history of urinary calculi: Secondary | ICD-10-CM | POA: Insufficient documentation

## 2021-12-29 DIAGNOSIS — Z87891 Personal history of nicotine dependence: Secondary | ICD-10-CM | POA: Insufficient documentation

## 2021-12-29 DIAGNOSIS — Z79899 Other long term (current) drug therapy: Secondary | ICD-10-CM | POA: Insufficient documentation

## 2021-12-29 DIAGNOSIS — J432 Centrilobular emphysema: Secondary | ICD-10-CM | POA: Insufficient documentation

## 2021-12-29 DIAGNOSIS — I10 Essential (primary) hypertension: Secondary | ICD-10-CM | POA: Insufficient documentation

## 2021-12-29 DIAGNOSIS — F329 Major depressive disorder, single episode, unspecified: Secondary | ICD-10-CM | POA: Diagnosis not present

## 2021-12-29 DIAGNOSIS — C3431 Malignant neoplasm of lower lobe, right bronchus or lung: Secondary | ICD-10-CM | POA: Diagnosis present

## 2021-12-29 DIAGNOSIS — I700 Atherosclerosis of aorta: Secondary | ICD-10-CM | POA: Insufficient documentation

## 2021-12-29 DIAGNOSIS — R222 Localized swelling, mass and lump, trunk: Secondary | ICD-10-CM | POA: Insufficient documentation

## 2021-12-29 DIAGNOSIS — E7800 Pure hypercholesterolemia, unspecified: Secondary | ICD-10-CM | POA: Diagnosis not present

## 2021-12-29 DIAGNOSIS — I6523 Occlusion and stenosis of bilateral carotid arteries: Secondary | ICD-10-CM | POA: Insufficient documentation

## 2021-12-29 NOTE — Progress Notes (Signed)
The proposed treatment discussed in cancer conference is for discussion purpose only and is not a binding recommendation. The patient was not physically examined nor present for their treatment options. Therefore, final treatment plans cannot be decided.  ?

## 2021-12-29 NOTE — Progress Notes (Signed)
?Radiation Oncology         [phone] ?________________________________ ? ?Name: Diane [name]        MRN: [id number]  ?Date of Service: 12/29/2021 DOB: 1955-12-19 ? ?VV:ZSMO, Costella Hatcher, MD  Garner Nash, DO    ? ?REFERRING PHYSICIAN: Garner Nash, DO ? ? ?DIAGNOSIS: The encounter diagnosis was Malignant neoplasm of lower lobe of right lung ([place]). ? ? ?HISTORY OF PRESENT ILLNESS: Diane [name] is a 66 y.o. female seen at the request of Dr. Valeta Harms for a newly diagnosed lung cancer. The patient was recently diagnosed with pneumonia and imaging at that time showed a nodule in the right lung. She was treated with antibiotics and she was referred for evaluation with pulmonary medicine. A CT Super D Scan on 12/13/21 showed a 3.4 cm cavitary mass in the right lower lobe and stale 9 mm precarinal nodes were noted. She had a PET scan that day as well and hypermetabolic changes in the RLL with an SUV of 24.4 was noted as well as right hilar nodal disease with SUV of 9.9. No evidence of metastatic disease was noted. And Bronchoscopy on 12/20/21 showed malignant cells consistent with squamous cell carcinoma in the fine-needle aspirate of the right lower lobe and rare atypical cells in the brushing specimen. The 10R node was also consistent with non small cell carcinoma.  Her case was discussed this morning in multidisciplinary thoracic oncology conference.  Discussion was for possible evaluation of surgical options given more limited stage disease.  If she is not a surgical candidate, she is seen today to discuss chemoradiation. ? ? ?PREVIOUS RADIATION THERAPY: No ? ? ?PAST MEDICAL HISTORY:  ?Past Medical History:  ?Diagnosis Date  ? Anxiety   ? Arthritis   ? fingers, left foot  ? C. difficile diarrhea 2015  ? COPD (chronic obstructive pulmonary disease) ([place])   ? Depression   ? Eczema   ? H pylori ulcer   ? Headache(784.0)   ? Hypercholesterolemia   ? Hypertension   ? Kidney stones   ? 20 years ago  ?  Pneumonia   ? 5 years ago  ? Seizures ([place])   ? 2 years ago, "cluster of seizures" none since  ? Shingles   ?   ? ? ?PAST SURGICAL HISTORY: ?Past Surgical History:  ?Procedure Laterality Date  ? ABDOMINAL HYSTERECTOMY    ? ADENOIDECTOMY    ? APPENDECTOMY    ? BRONCHIAL BIOPSY  12/20/2021  ? Procedure: BRONCHIAL BIOPSIES;  Surgeon: Garner Nash, DO;  Location: [place] [facility];  Service: Pulmonary;;  ? BRONCHIAL BRUSHINGS  12/20/2021  ? Procedure: BRONCHIAL BRUSHINGS;  Surgeon: Garner Nash, DO;  Location: [place];  Service: Pulmonary;;  ? BRONCHIAL NEEDLE ASPIRATION BIOPSY  12/20/2021  ? Procedure: BRONCHIAL NEEDLE ASPIRATION BIOPSIES;  Surgeon: Garner Nash, DO;  Location: [place] [facility];  Service: Pulmonary;;  ? CESAREAN SECTION    ? CHOLECYSTECTOMY    ? COLONOSCOPY  06/10/2012  ? RMR: Colonic polyps -removed as described above.   ? FINE NEEDLE ASPIRATION  12/20/2021  ? Procedure: FINE NEEDLE ASPIRATION (FNA) LINEAR;  Surgeon: Garner Nash, DO;  Location: [place] [facility];  Service: Pulmonary;;  ? KNEE ARTHROSCOPY  1973  ? right knee,  torn cart  ? [place] SURGERY  05/12/2015  ? L4 L5  ? LUMBAR LAMINECTOMY/DECOMPRESSION MICRODISCECTOMY Left 05/12/2015  ? Procedure: Left L4-5 Microdiscectomy;  Surgeon: Marybelle Killings, MD;  Location: [place];  Service: Orthopedics;  Laterality:  Left;  ? TONSILLECTOMY    ? VIDEO BRONCHOSCOPY WITH ENDOBRONCHIAL ULTRASOUND Bilateral 12/20/2021  ? Procedure: VIDEO BRONCHOSCOPY WITH ENDOBRONCHIAL ULTRASOUND;  Surgeon: Garner Nash, DO;  Location: [place];  Service: Pulmonary;  Laterality: Bilateral;  ? VIDEO BRONCHOSCOPY WITH RADIAL ENDOBRONCHIAL ULTRASOUND  12/20/2021  ? Procedure: RADIAL ENDOBRONCHIAL ULTRASOUND;  Surgeon: Garner Nash, DO;  Location: [place] [facility];  Service: Pulmonary;;  ? ? ? ?FAMILY HISTORY:  ?Family History  ?Problem Relation Age of Onset  ? Arthritis Mother   ? Asthma Mother   ? Depression Mother   ? Hyperlipidemia Mother   ? Varicose Veins Mother   ?  Arthritis Father   ? Heart disease Father   ? Hyperlipidemia Father   ? Vision loss Father   ? Cancer Sister   ? COPD Sister   ? Early death Sister   ? Cancer Brother   ? Alcohol abuse Maternal Grandfather   ? Colon cancer Neg Hx   ? ? ? ?SOCIAL HISTORY:  reports that she quit smoking about 4 years ago. Her smoking use included cigarettes. She has a 15.00 pack-year smoking history. She has never used smokeless tobacco. She reports current alcohol use of about 21.0 standard drinks per week. She reports that she does not use drugs. The patient is married and accompanied by her husband. They live in [place]. She is in an Programme researcher, broadcasting/film/video for [employer]. She enjoys spending time travelling and with her family. She is close to retirement.  She reports that she walks her dog regularly and does not tie her or become short of breath with exertion during these activities.  She denies any shortness of breath going up or down stairs.  She is not experiencing chest pain during this type of exertion either.  ? ? ?ALLERGIES: Alpha-gal, Other, Penicillins, Keppra [levetiracetam], and Prozac [fluoxetine] ? ? ?MEDICATIONS:  ?Current Outpatient Medications  ?Medication Sig Dispense Refill  ? acitretin (SORIATANE) 25 MG capsule TAKE ONE CAPSULE BY MOUTH DAILY (Patient taking differently: Take 25 mg by mouth every other day.) 30 capsule 3  ? albuterol (PROVENTIL) (2.5 MG/3ML) 0.083% nebulizer solution Take 3 mLs (2.5 mg total) by nebulization every 6 (six) hours as needed for wheezing or shortness of breath. 75 mL 12  ? albuterol (VENTOLIN HFA) 108 (90 Base) MCG/ACT inhaler Inhale 2 puffs into the lungs every 6 (six) hours as needed for wheezing or shortness of breath. 8 g 2  ? cetirizine (ZYRTEC) 10 MG tablet Take 10 mg by mouth at bedtime.    ? clobetasol ointment (TEMOVATE) 0.05 % APPLY TWICE DAILY (Patient taking differently: 1 application. daily as needed (Skin condition).) 60 g 0  ? EPINEPHrine (AUVI-Q) 0.3 mg/0.3 mL IJ SOAJ  injection Inject 0.3 mLs (0.3 mg total) into the muscle as needed for anaphylaxis. 2 each 1  ? fluticasone (FLONASE) 50 MCG/ACT nasal spray Place 2 sprays into both nostrils daily. (Patient taking differently: Place 2 sprays into both nostrils daily as needed for allergies.) 16 g 5  ? hydrochlorothiazide (MICROZIDE) 12.5 MG capsule Take 12.5 mg by mouth daily.    ? nicotine (NICODERM CQ - DOSED IN MG/24 HR) 7 mg/24hr patch Place 7 mg onto the skin daily.    ? omeprazole (PRILOSEC) 20 MG capsule Take 1 capsule (20 mg total) by mouth 2 (two) times daily. (Needs to be seen before next refill) 60 capsule 0  ? rosuvastatin (CRESTOR) 10 MG tablet Take 10 mg by mouth at bedtime.    ? ?  No current facility-administered medications for this encounter.  ? ? ? ?REVIEW OF SYSTEMS: On review of systems, the patient reports that she is doing okay overall. She is understandably shocked by her diagnosis. She reports she feels much better however than the would anticipate. She describes improvement in her cough in the last few weeks, but at times has snoring and possibly symptoms of sleep apnea that was been more noticeable as of late. She reports some productive mucous with her cough, but also has some dryness. No hemoptysis is noted after about 5 days post bronchoscopy. She reports is very tired, and has lost about 15 pounds unintentionally in the last 6 months or so. No other complaints are noted.  ? ?  ? ?PHYSICAL EXAM:  ?Wt Readings from Last 3 Encounters:  ?12/29/21 137 lb 9.6 oz (62.4 kg)  ?12/27/21 141 lb (64 kg)  ?12/20/21 144 lb (65.3 kg)  ? ?Temp Readings from Last 3 Encounters:  ?12/29/21 97.6 ?F (36.4 ?C)  ?12/27/21 98.1 ?F (36.7 ?C) (Oral)  ?12/20/21 98 ?F (36.7 ?C)  ? ?BP Readings from Last 3 Encounters:  ?12/29/21 115/71  ?12/27/21 104/62  ?12/20/21 117/65  ? ?Pulse Readings from Last 3 Encounters:  ?12/29/21 81  ?12/27/21 85  ?12/20/21 69  ? ?Pain Assessment ?Pain Score: 3  ?Pain Loc: Chest/10 ? ?In general this is a  well appearing caucasian in no acute distress. She's alert and oriented x4 and appropriate throughout the examination. Cardiopulmonary assessment is negative for acute distress and she exhibits normal effort.

## 2021-12-30 ENCOUNTER — Telehealth: Payer: Self-pay | Admitting: Acute Care

## 2021-12-30 ENCOUNTER — Encounter: Payer: Self-pay | Admitting: *Deleted

## 2021-12-30 DIAGNOSIS — C3490 Malignant neoplasm of unspecified part of unspecified bronchus or lung: Secondary | ICD-10-CM

## 2021-12-30 DIAGNOSIS — R911 Solitary pulmonary nodule: Secondary | ICD-10-CM

## 2021-12-30 NOTE — Telephone Encounter (Signed)
Can we get this set up for Diane [name] for a PFT prior to her OV on 01/06/22 with Dr. Kipp Brood? thanks ?

## 2021-12-30 NOTE — Progress Notes (Signed)
Oncology Nurse Navigator Documentation ? ?Oncology Nurse Navigator Flowsheets 12/30/2021  ?Navigator Location [facility]  ?Navigator Encounter Type Other:  ?Barriers/Navigation Needs Coordination of Care/Lab orders placed per protocol   ?Interventions Coordination of Care  ?Acuity Level 2-Minimal Needs (1-2 Barriers Identified)  ?Time Spent with Patient 15  ?  ?

## 2021-12-30 NOTE — Telephone Encounter (Signed)
?  Magdalen Spatz, NP ?  ?   1:34 PM ?Note ?I have called the patient and discussed the potential option of surgery for her new diagnosis of non-small cell lung cancer of the right lung.She is in agreement with this plan. She understands her option for surgery is dependent on her Pulmonary function tests. I have placed the order for PFT's. Barnet Pall can you please schedule these today. If necessary please get them scheduled at the Hospital if that means we can get results by 01/06/2022 when she has an appointment with Dr. Kipp Brood for surgical evaluation.  ?I am also placing orders for MRI brain.  ?Thanks so much  ?  ? ?Please see the message from today. Nothing further needed at this time. ?

## 2021-12-30 NOTE — Telephone Encounter (Signed)
I have called the patient and discussed the potential option of surgery for her new diagnosis of non-small cell lung cancer of the right lung.She is in agreement with this plan. She understands her option for surgery is dependent on her Pulmonary function tests. I have placed the order for PFT's. Barnet Pall can you please schedule these today. If necessary please get them scheduled at the Hospital if that means we can get results by 01/06/2022 when she has an appointment with Dr. Kipp Brood for surgical evaluation.  ?I am also placing orders for MRI brain.  ?Thanks so much ?

## 2021-12-30 NOTE — Progress Notes (Signed)
Oncology Nurse Navigator Documentation ? ?Oncology Nurse Navigator Flowsheets 12/30/2021  ?Navigator Location [facility]  ?Navigator Encounter Type Other:  ?Barriers/Navigation Needs Coordination of Care/labs orders placed per protocol.  ?Interventions Coordination of Care  ?Acuity Level 2-Minimal Needs (1-2 Barriers Identified)  ?Time Spent with Patient 15  ?  ?

## 2021-12-30 NOTE — Telephone Encounter (Signed)
This was scheduled in our office on 3/22.   ?

## 2022-01-02 ENCOUNTER — Telehealth: Payer: Self-pay | Admitting: Pulmonary Disease

## 2022-01-02 NOTE — Progress Notes (Signed)
? ?   ?[address] ?      [place] [address] ?            [phone]   ?                 ?Diane [name] ?[facility] Record #[id number] ?Date of Birth: 1956-02-13 ? ?Referring: Hayden Pedro,* ?Primary Care: Glenda Chroman, MD ?Primary Cardiologist: None ? ?Chief Complaint:    ?Chief Complaint  ?Patient presents with  ? Lung Cancer  ?  Surgical consult,   ? ? ?History of Present Illness:    ?Diane [name] 66 y.o. female referred for surgical evaluation of a 3.4 cm right lower lobe biopsy-proven non-small cell lung cancer.  She also underwent biopsy of her hilar lymph nodes and this was positive as well.  She does have a significant smoking history but quit in December 2022.  She denies any chest pain or shortness of breath.  She denies any neurologic symptoms. ?  ? ? ? ?Past Medical History:  ?Diagnosis Date  ? Anxiety   ? Arthritis   ? fingers, left foot  ? C. difficile diarrhea 2015  ? COPD (chronic obstructive pulmonary disease) ([place])   ? Depression   ? Eczema   ? H pylori ulcer   ? Headache(784.0)   ? Hypercholesterolemia   ? Hypertension   ? Kidney stones   ? 20 years ago  ? Pneumonia   ? 5 years ago  ? Seizures ([place])   ? 2 years ago, "cluster of seizures" none since  ? Shingles   ? ? ?Past Surgical History:  ?Procedure Laterality Date  ? ABDOMINAL HYSTERECTOMY    ? ADENOIDECTOMY    ? APPENDECTOMY    ? BRONCHIAL BIOPSY  12/20/2021  ? Procedure: BRONCHIAL BIOPSIES;  Surgeon: Garner Nash, DO;  Location: [facility];  Service: Pulmonary;;  ? BRONCHIAL BRUSHINGS  12/20/2021  ? Procedure: BRONCHIAL BRUSHINGS;  Surgeon: Garner Nash, DO;  Location: [facility];  Service: Pulmonary;;  ? BRONCHIAL NEEDLE ASPIRATION BIOPSY  12/20/2021  ? Procedure: BRONCHIAL NEEDLE ASPIRATION BIOPSIES;  Surgeon: Garner Nash, DO;  Location: [facility];  Service: Pulmonary;;  ? CESAREAN SECTION    ? CHOLECYSTECTOMY    ? COLONOSCOPY  06/10/2012  ? RMR: Colonic polyps -removed as  described above.   ? FINE NEEDLE ASPIRATION  12/20/2021  ? Procedure: FINE NEEDLE ASPIRATION (FNA) LINEAR;  Surgeon: Garner Nash, DO;  Location: [facility];  Service: Pulmonary;;  ? KNEE ARTHROSCOPY  1973  ? right knee,  torn cart  ? [place] SURGERY  05/12/2015  ? L4 L5  ? LUMBAR LAMINECTOMY/DECOMPRESSION MICRODISCECTOMY Left 05/12/2015  ? Procedure: Left L4-5 Microdiscectomy;  Surgeon: Marybelle Killings, MD;  Location: [facility];  Service: Orthopedics;  Laterality: Left;  ? TONSILLECTOMY    ? VIDEO BRONCHOSCOPY WITH ENDOBRONCHIAL ULTRASOUND Bilateral 12/20/2021  ? Procedure: VIDEO BRONCHOSCOPY WITH ENDOBRONCHIAL ULTRASOUND;  Surgeon: Garner Nash, DO;  Location: [facility];  Service: Pulmonary;  Laterality: Bilateral;  ? VIDEO BRONCHOSCOPY WITH RADIAL ENDOBRONCHIAL ULTRASOUND  12/20/2021  ? Procedure: RADIAL ENDOBRONCHIAL ULTRASOUND;  Surgeon: Garner Nash, DO;  Location: [facility];  Service: Pulmonary;;  ? ? ?Family History  ?Problem Relation Age of Onset  ? Arthritis Mother   ? Asthma Mother   ? Depression Mother   ? Hyperlipidemia Mother   ? Varicose Veins Mother   ? Arthritis Father   ? Heart disease Father   ?  Hyperlipidemia Father   ? Vision loss Father   ? Cancer Sister   ? COPD Sister   ? Early death Sister   ? Cancer Brother   ? Alcohol abuse Maternal Grandfather   ? Colon cancer Neg Hx   ? ? ? ?Social History  ? ?Tobacco Use  ?Smoking Status Former  ? Packs/day: 0.50  ? Years: 30.00  ? Pack years: 15.00  ? Types: Cigarettes  ? Quit date: 12/14/2017  ? Years since quitting: 4.0  ?Smokeless Tobacco Never  ?Tobacco Comments  ? 5-10 cigarettes a day (plans to quit prior to this surgery 05/06/15)  ?  ?Social History  ? ?Substance and Sexual Activity  ?Alcohol Use Yes  ? Alcohol/week: 21.0 standard drinks  ? Types: 21 Shots of liquor per week  ? Comment: 3 per day  ? ? ? ?Allergies  ?Allergen Reactions  ? Alpha-Gal   ?  Other reaction(s): Abdominal Pain  ? Other Other (See Comments)  ? Penicillins   ?   Unknown type of reaction  ? Keppra [Levetiracetam]   ?  irritability  ? Prozac [Fluoxetine] Rash  ? ? ?Current Outpatient Medications  ?Medication Sig Dispense Refill  ? acitretin (SORIATANE) 25 MG capsule TAKE ONE CAPSULE BY MOUTH DAILY (Patient taking differently: Take 25 mg by mouth every other day.) 30 capsule 3  ? ALPRAZolam (XANAX) 0.5 MG tablet Take 0.5 mg by mouth daily as needed.    ? cetirizine (ZYRTEC) 10 MG tablet Take 10 mg by mouth at bedtime.    ? clobetasol ointment (TEMOVATE) 0.05 % APPLY TWICE DAILY (Patient taking differently: 1 application. daily as needed (Skin condition).) 60 g 0  ? EPINEPHrine (AUVI-Q) 0.3 mg/0.3 mL IJ SOAJ injection Inject 0.3 mLs (0.3 mg total) into the muscle as needed for anaphylaxis. 2 each 1  ? fluticasone (FLONASE) 50 MCG/ACT nasal spray Place 2 sprays into both nostrils daily. (Patient taking differently: Place 2 sprays into both nostrils daily as needed for allergies.) 16 g 5  ? hydrochlorothiazide (MICROZIDE) 12.5 MG capsule Take 12.5 mg by mouth daily.    ? nicotine (NICODERM CQ - DOSED IN MG/24 HR) 7 mg/24hr patch Place 7 mg onto the skin daily.    ? omeprazole (PRILOSEC) 20 MG capsule Take 1 capsule (20 mg total) by mouth 2 (two) times daily. (Needs to be seen before next refill) 60 capsule 0  ? OVER THE COUNTER MEDICATION Take 1 capsule by mouth daily. Probiotic    ? rosuvastatin (CRESTOR) 10 MG tablet Take 10 mg by mouth at bedtime.    ? Tiotropium Bromide-Olodaterol (STIOLTO RESPIMAT) 2.5-2.5 MCG/ACT AERS Inhale into the lungs.    ? albuterol (PROVENTIL) (2.5 MG/3ML) 0.083% nebulizer solution Take 3 mLs (2.5 mg total) by nebulization every 6 (six) hours as needed for wheezing or shortness of breath. (Patient not taking: Reported on 01/06/2022) 75 mL 12  ? albuterol (VENTOLIN HFA) 108 (90 Base) MCG/ACT inhaler Inhale 2 puffs into the lungs every 6 (six) hours as needed for wheezing or shortness of breath. (Patient not taking: Reported on 01/06/2022) 8 g 2  ? ?No  current facility-administered medications for this visit.  ? ? ?Review of Systems  ?Constitutional: Negative.   ?Respiratory:  Positive for cough.   ?Cardiovascular: Negative.   ? ? ?PHYSICAL EXAMINATION: ?BP 129/79   Pulse 72   Resp 20   Ht 5\' 6"  (1.676 m)   Wt 144 lb (65.3 kg)   SpO2 93% Comment: RA  BMI 23.24 kg/m?  ?Physical Exam ?Constitutional:   ?   General: She is not in acute distress. ?   Appearance: Normal appearance. She is normal weight. She is not ill-appearing.  ?Eyes:  ?   Extraocular Movements: Extraocular movements intact.  ?Cardiovascular:  ?   Rate and Rhythm: Normal rate.  ?Pulmonary:  ?   Effort: Pulmonary effort is normal. No respiratory distress.  ?Abdominal:  ?   General: Abdomen is flat.  ?Musculoskeletal:     ?   General: Normal range of motion.  ?   Cervical back: Normal range of motion.  ?Skin: ?   General: Skin is warm and dry.  ?Neurological:  ?   General: No focal deficit present.  ?   Mental Status: She is alert and oriented to person, place, and time.  ? ? ?Diagnostic Studies & Laboratory data: ?   ? Recent Radiology Findings:  ? MR BRAIN W WO CONTRAST ? ?Result Date: 01/05/2022 ?CLINICAL DATA:  Non-small cell lung carcinoma EXAM: MRI HEAD WITHOUT AND WITH CONTRAST TECHNIQUE: Multiplanar, multiecho pulse sequences of the brain and surrounding structures were obtained without and with intravenous contrast. CONTRAST:  69mL GADAVIST GADOBUTROL 1 MMOL/ML IV SOLN COMPARISON:  11/06/2012 FINDINGS: Brain: No acute infarct, mass effect or extra-axial collection. No acute or chronic hemorrhage. Normal white matter signal, parenchymal volume and CSF spaces. The midline structures are normal. There is no abnormal contrast enhancement. Vascular: Major flow voids are preserved. Skull and upper cervical spine: Normal calvarium and skull base. Visualized upper cervical spine and soft tissues are normal. Sinuses/Orbits:No paranasal sinus fluid levels or advanced mucosal thickening. No mastoid  or middle ear effusion. Normal orbits. IMPRESSION: Normal brain MRI. No intracranial metastatic disease. Electronically Signed   By: Ulyses Jarred M.D.   On: 01/05/2022 01:26  ? ?NM PET Image Initial (PI) Skull B

## 2022-01-02 NOTE — Telephone Encounter (Signed)
Received new forms from [place]. Emailed to [employer] to review. ?

## 2022-01-03 ENCOUNTER — Other Ambulatory Visit: Payer: Self-pay

## 2022-01-03 ENCOUNTER — Ambulatory Visit (HOSPITAL_COMMUNITY)
Admission: RE | Admit: 2022-01-03 | Discharge: 2022-01-03 | Disposition: A | Discharge status: Home / Self Care | Payer: 59 | Source: Ambulatory Visit | Attending: Acute Care | Admitting: Acute Care

## 2022-01-03 DIAGNOSIS — R590 Localized enlarged lymph nodes: Secondary | ICD-10-CM

## 2022-01-03 DIAGNOSIS — C3490 Malignant neoplasm of unspecified part of unspecified bronchus or lung: Secondary | ICD-10-CM | POA: Insufficient documentation

## 2022-01-03 MED ORDER — GADOBUTROL 1 MMOL/ML IV SOLN
6.0000 mL | Freq: Once | INTRAVENOUS | Status: AC | PRN
  Administered 2022-01-03: 6 mL via INTRAVENOUS

## 2022-01-04 ENCOUNTER — Other Ambulatory Visit: Payer: Self-pay | Admitting: Medical Oncology

## 2022-01-04 ENCOUNTER — Ambulatory Visit (INDEPENDENT_AMBULATORY_CARE_PROVIDER_SITE_OTHER): Payer: 59 | Admitting: Pulmonary Disease

## 2022-01-04 DIAGNOSIS — R911 Solitary pulmonary nodule: Secondary | ICD-10-CM | POA: Diagnosis not present

## 2022-01-04 LAB — PULMONARY FUNCTION TEST
DL/VA % pred: 55 %
DL/VA: 2.31 ml/min/mmHg/L
DLCO cor % pred: 44 %
DLCO cor: 9.15 ml/min/mmHg
DLCO unc % pred: 44 %
DLCO unc: 9.15 ml/min/mmHg
FEF 25-75 Post: 1.44 L/sec
FEF 25-75 Pre: 1.27 L/sec
FEF2575-%Change-Post: 13 %
FEF2575-%Pred-Post: 64 %
FEF2575-%Pred-Pre: 56 %
FEV1-%Change-Post: 3 %
FEV1-%Pred-Post: 75 %
FEV1-%Pred-Pre: 72 %
FEV1-Post: 1.91 L
FEV1-Pre: 1.84 L
FEV1FVC-%Change-Post: -2 %
FEV1FVC-%Pred-Pre: 94 %
FEV6-%Change-Post: 7 %
FEV6-%Pred-Post: 83 %
FEV6-%Pred-Pre: 78 %
FEV6-Post: 2.67 L
FEV6-Pre: 2.49 L
FEV6FVC-%Change-Post: 0 %
FEV6FVC-%Pred-Post: 103 %
FEV6FVC-%Pred-Pre: 103 %
FVC-%Change-Post: 5 %
FVC-%Pred-Post: 81 %
FVC-%Pred-Pre: 76 %
FVC-Post: 2.69 L
FVC-Pre: 2.54 L
Post FEV1/FVC ratio: 71 %
Post FEV6/FVC ratio: 99 %
Pre FEV1/FVC ratio: 73 %
Pre FEV6/FVC Ratio: 100 %
RV % pred: 62 %
RV: 1.33 L
TLC % pred: 75 %
TLC: 3.94 L

## 2022-01-04 NOTE — Progress Notes (Signed)
PFT done today. 

## 2022-01-05 ENCOUNTER — Encounter: Payer: Self-pay | Admitting: Internal Medicine

## 2022-01-05 ENCOUNTER — Inpatient Hospital Stay: Payer: 59

## 2022-01-05 ENCOUNTER — Other Ambulatory Visit: Payer: Self-pay

## 2022-01-05 ENCOUNTER — Encounter: Payer: Self-pay | Admitting: *Deleted

## 2022-01-05 ENCOUNTER — Inpatient Hospital Stay (HOSPITAL_BASED_OUTPATIENT_CLINIC_OR_DEPARTMENT_OTHER): Payer: 59 | Admitting: Internal Medicine

## 2022-01-05 DIAGNOSIS — F419 Anxiety disorder, unspecified: Secondary | ICD-10-CM

## 2022-01-05 DIAGNOSIS — M1990 Unspecified osteoarthritis, unspecified site: Secondary | ICD-10-CM

## 2022-01-05 DIAGNOSIS — J449 Chronic obstructive pulmonary disease, unspecified: Secondary | ICD-10-CM

## 2022-01-05 DIAGNOSIS — I10 Essential (primary) hypertension: Secondary | ICD-10-CM | POA: Insufficient documentation

## 2022-01-05 DIAGNOSIS — Z87891 Personal history of nicotine dependence: Secondary | ICD-10-CM

## 2022-01-05 DIAGNOSIS — C3411 Malignant neoplasm of upper lobe, right bronchus or lung: Secondary | ICD-10-CM | POA: Insufficient documentation

## 2022-01-05 DIAGNOSIS — R590 Localized enlarged lymph nodes: Secondary | ICD-10-CM | POA: Diagnosis not present

## 2022-01-05 DIAGNOSIS — Z5111 Encounter for antineoplastic chemotherapy: Secondary | ICD-10-CM

## 2022-01-05 DIAGNOSIS — F32A Depression, unspecified: Secondary | ICD-10-CM | POA: Insufficient documentation

## 2022-01-05 DIAGNOSIS — C3491 Malignant neoplasm of unspecified part of right bronchus or lung: Secondary | ICD-10-CM

## 2022-01-05 DIAGNOSIS — Z79899 Other long term (current) drug therapy: Secondary | ICD-10-CM

## 2022-01-05 DIAGNOSIS — Z7189 Other specified counseling: Secondary | ICD-10-CM | POA: Insufficient documentation

## 2022-01-05 DIAGNOSIS — C3431 Malignant neoplasm of lower lobe, right bronchus or lung: Secondary | ICD-10-CM | POA: Diagnosis present

## 2022-01-05 LAB — CBC WITH DIFFERENTIAL (CANCER CENTER ONLY)
Abs Immature Granulocytes: 0.06 10*3/uL (ref 0.00–0.07)
Basophils Absolute: 0 10*3/uL (ref 0.0–0.1)
Basophils Relative: 0 %
Eosinophils Absolute: 0.5 10*3/uL (ref 0.0–0.5)
Eosinophils Relative: 6 %
HCT: 36.7 % (ref 36.0–46.0)
Hemoglobin: 12.3 g/dL (ref 12.0–15.0)
Immature Granulocytes: 1 %
Lymphocytes Relative: 24 %
Lymphs Abs: 2.1 10*3/uL (ref 0.7–4.0)
MCH: 31.5 pg (ref 26.0–34.0)
MCHC: 33.5 g/dL (ref 30.0–36.0)
MCV: 93.9 fL (ref 80.0–100.0)
Monocytes Absolute: 0.5 10*3/uL (ref 0.1–1.0)
Monocytes Relative: 6 %
Neutro Abs: 5.7 10*3/uL (ref 1.7–7.7)
Neutrophils Relative %: 63 %
Platelet Count: 351 10*3/uL (ref 150–400)
RBC: 3.91 MIL/uL (ref 3.87–5.11)
RDW: 12.5 % (ref 11.5–15.5)
WBC Count: 9 10*3/uL (ref 4.0–10.5)
nRBC: 0 % (ref 0.0–0.2)

## 2022-01-05 LAB — CMP (CANCER CENTER ONLY)
ALT: 12 U/L (ref 0–44)
AST: 16 U/L (ref 15–41)
Albumin: 4.2 g/dL (ref 3.5–5.0)
Alkaline Phosphatase: 72 U/L (ref 38–126)
Anion gap: 9 (ref 5–15)
BUN: 11 mg/dL (ref 8–23)
CO2: 29 mmol/L (ref 22–32)
Calcium: 9.8 mg/dL (ref 8.9–10.3)
Chloride: 99 mmol/L (ref 98–111)
Creatinine: 0.62 mg/dL (ref 0.44–1.00)
GFR, Estimated: >60 mL/min (ref >60)
Glucose, Bld: 96 mg/dL (ref 70–99)
Potassium: 3.7 mmol/L (ref 3.5–5.1)
Sodium: 137 mmol/L (ref 135–145)
Total Bilirubin: 0.4 mg/dL (ref 0.3–1.2)
Total Protein: 8.4 g/dL — ABNORMAL HIGH (ref 6.5–8.1)

## 2022-01-05 NOTE — Progress Notes (Signed)
? ? [facility] ?[phone]   [phone] ? ?CONSULT NOTE ? ?REFERRING PHYSICIAN: Dr. Leory Plowman Icard ? ?REASON FOR CONSULTATION:  ?66 years old white female recently diagnosed with lung cancer. ? ?HPI ?Elise Knobloch is a 66 y.o. female with past medical history significant for osteoarthritis, hypertension, dyslipidemia, depression, and anxiety, COPD, kidney stone, seizure and long history of smoking but quit December 14, 2017.  The patient was complaining of chronic cough and she was seen by her primary care provider and had a chest x-ray that was suspicious for pneumonia.  She was treated with antibiotics with no improvement in her symptoms.  She had CT scan of the chest without contrast on November 23, 2021 and it showed 2.9 cm partially cavitary mass within the superior segment of the right lower lobe suspicious for primary bronchogenic neoplasm.  There was possible pathologic precarinal lymph node and PET scan was recommended. ?The patient had CT super D of the chest as well as PET scan on December 13, 2020 and the PET scan showed cavitary mass in the superior segment of the right lower lobe that intensely hypermetabolic with SUV max of 81.7.  The mass measured 3.4 x 3.0 cm.  There was also hypermetabolic activity in the right hilum consistent with a nodal metastasis with SUV max of 9.9.  There was nonspecific mildly hypermetabolic inguinal lymph nodes bilaterally unlikely to reflect metastases and probably reactive.  There was no evidence of distant metastatic disease or other metastatic disease in the chest. ?The patient was seen by Dr. Valeta Harms and she underwent video bronchoscopy with robotic assisted bronchoscopic navigation as well as EBUS and biopsies of the right lower lobe lung mass as well as the hilar adenopathy. ?The final pathology ([id number]; [id number]) from the right lower lobe fine-needle aspiration as well as the right hilar lymph node was consistent  with squamous cell carcinoma. ?The patient also had MRI of the brain performed on January 03 2022 and it showed no evidence of metastatic disease to the brain.  The patient was referred to me today for evaluation and recommendation regarding her condition.  She was also seen by Dr. Lisbeth Renshaw for discussion of the radiotherapy option and she is scheduled to see Dr. Kipp Brood tomorrow to discuss the surgical option if she is a surgical candidate. ?When seen today the patient continues to complain of fatigue as well as chronic cough productive of light greenish sputum and shortness of breath with exertion but no significant chest pain.  She has no nausea, vomiting, diarrhea or constipation.  She has no headache or visual changes.  She has some peeling of the skin from the palms.  She is followed by dermatology Dr. Salome Holmes. ?Family history significant for mother with asthma and stroke.  Father had atrial fibrillation and died from COVID infection.  The patient had a sister who died from small cell lung cancer at age 18.  She also has a brother with multiple myeloma. ?The patient is married and has 2 daughters and 1 son.  She was accompanied today by her husband [name].  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  She has a history of smoking 1 pack/day for around 30 years and quit October 05, 2021.  She drinks a couple of alcoholic drinks every night.  She has no history of drug abuse. ? ?HPI ? ?Past Medical History:  ?Diagnosis Date  ? Anxiety   ? Arthritis   ? fingers, left  foot  ? C. difficile diarrhea 2015  ? COPD (chronic obstructive pulmonary disease) ([place])   ? Depression   ? Eczema   ? H pylori ulcer   ? Headache(784.0)   ? Hypercholesterolemia   ? Hypertension   ? Kidney stones   ? 20 years ago  ? Pneumonia   ? 5 years ago  ? Seizures ([place])   ? 2 years ago, "cluster of seizures" none since  ? Shingles   ? ? ?Past Surgical History:  ?Procedure Laterality Date  ? ABDOMINAL HYSTERECTOMY     ? ADENOIDECTOMY    ? APPENDECTOMY    ? BRONCHIAL BIOPSY  12/20/2021  ? Procedure: BRONCHIAL BIOPSIES;  Surgeon: Garner Nash, DO;  Location: [facility];  Service: Pulmonary;;  ? BRONCHIAL BRUSHINGS  12/20/2021  ? Procedure: BRONCHIAL BRUSHINGS;  Surgeon: Garner Nash, DO;  Location: [facility];  Service: Pulmonary;;  ? BRONCHIAL NEEDLE ASPIRATION BIOPSY  12/20/2021  ? Procedure: BRONCHIAL NEEDLE ASPIRATION BIOPSIES;  Surgeon: Garner Nash, DO;  Location: [facility];  Service: Pulmonary;;  ? CESAREAN SECTION    ? CHOLECYSTECTOMY    ? COLONOSCOPY  06/10/2012  ? RMR: Colonic polyps -removed as described above.   ? FINE NEEDLE ASPIRATION  12/20/2021  ? Procedure: FINE NEEDLE ASPIRATION (FNA) LINEAR;  Surgeon: Garner Nash, DO;  Location: [facility];  Service: Pulmonary;;  ? KNEE ARTHROSCOPY  1973  ? right knee,  torn cart  ? [facility] SURGERY  05/12/2015  ? L4 L5  ? LUMBAR LAMINECTOMY/DECOMPRESSION MICRODISCECTOMY Left 05/12/2015  ? Procedure: Left L4-5 Microdiscectomy;  Surgeon: Marybelle Killings, MD;  Location: [facility];  Service: Orthopedics;  Laterality: Left;  ? TONSILLECTOMY    ? VIDEO BRONCHOSCOPY WITH ENDOBRONCHIAL ULTRASOUND Bilateral 12/20/2021  ? Procedure: VIDEO BRONCHOSCOPY WITH ENDOBRONCHIAL ULTRASOUND;  Surgeon: Garner Nash, DO;  Location: [facility];  Service: Pulmonary;  Laterality: Bilateral;  ? VIDEO BRONCHOSCOPY WITH RADIAL ENDOBRONCHIAL ULTRASOUND  12/20/2021  ? Procedure: RADIAL ENDOBRONCHIAL ULTRASOUND;  Surgeon: Garner Nash, DO;  Location: [facility];  Service: Pulmonary;;  ? ? ?Family History  ?Problem Relation Age of Onset  ? Arthritis Mother   ? Asthma Mother   ? Depression Mother   ? Hyperlipidemia Mother   ? Varicose Veins Mother   ? Arthritis Father   ? Heart disease Father   ? Hyperlipidemia Father   ? Vision loss Father   ? Cancer Sister   ? COPD Sister   ? Early death Sister   ? Cancer Brother   ? Alcohol abuse Maternal Grandfather   ? Colon cancer Neg Hx   ? ? ?Social  History ?Social History  ? ?Tobacco Use  ? Smoking status: Former  ?  Packs/day: 0.50  ?  Years: 30.00  ?  Pack years: 15.00  ?  Types: Cigarettes  ?  Quit date: 12/14/2017  ?  Years since quitting: 4.0  ? Smokeless tobacco: Never  ? Tobacco comments:  ?  5-10 cigarettes a day (plans to quit prior to this surgery 05/06/15)  ?Vaping Use  ? Vaping Use: Some days  ?Substance Use Topics  ? Alcohol use: Yes  ?  Alcohol/week: 21.0 standard drinks  ?  Types: 21 Shots of liquor per week  ?  Comment: 3 per day  ? Drug use: No  ? ? ?Allergies  ?Allergen Reactions  ? Alpha-Gal   ?  Other reaction(s): Abdominal Pain  ? Other Other (See Comments)  ? Penicillins   ?  Unknown type of reaction  ? Keppra [Levetiracetam]   ?  irritability  ? Prozac [Fluoxetine] Rash  ? ? ?Current Outpatient Medications  ?Medication Sig Dispense Refill  ? acitretin (SORIATANE) 25 MG capsule TAKE ONE CAPSULE BY MOUTH DAILY (Patient taking differently: Take 25 mg by mouth every other day.) 30 capsule 3  ? albuterol (PROVENTIL) (2.5 MG/3ML) 0.083% nebulizer solution Take 3 mLs (2.5 mg total) by nebulization every 6 (six) hours as needed for wheezing or shortness of breath. 75 mL 12  ? albuterol (VENTOLIN HFA) 108 (90 Base) MCG/ACT inhaler Inhale 2 puffs into the lungs every 6 (six) hours as needed for wheezing or shortness of breath. 8 g 2  ? ALPRAZolam (XANAX) 0.5 MG tablet Take 0.5 mg by mouth daily as needed.    ? cetirizine (ZYRTEC) 10 MG tablet Take 10 mg by mouth at bedtime.    ? clobetasol ointment (TEMOVATE) 0.05 % APPLY TWICE DAILY (Patient taking differently: 1 application. daily as needed (Skin condition).) 60 g 0  ? EPINEPHrine (AUVI-Q) 0.3 mg/0.3 mL IJ SOAJ injection Inject 0.3 mLs (0.3 mg total) into the muscle as needed for anaphylaxis. 2 each 1  ? fluticasone (FLONASE) 50 MCG/ACT nasal spray Place 2 sprays into both nostrils daily. (Patient taking differently: Place 2 sprays into both nostrils daily as needed for allergies.) 16 g 5  ?  hydrochlorothiazide (MICROZIDE) 12.5 MG capsule Take 12.5 mg by mouth daily.    ? nicotine (NICODERM CQ - DOSED IN MG/24 HR) 7 mg/24hr patch Place 7 mg onto the skin daily.    ? omeprazole (PRILOSEC) 20 MG c

## 2022-01-05 NOTE — Progress Notes (Signed)
Oncology Nurse Navigator Documentation ? ? ?  01/05/2022  ?  2:00 PM 12/30/2021  ?  9:00 AM  ?Oncology Nurse Navigator Flowsheets  ?Navigator Location [facility]  ?Navigator Encounter Type Other: Other:  ?Barriers/Navigation Needs Coordination of Care/I updated Dr. Julien Nordmann on molecular and PDL 1 testing results completed at [facility].   Coordination of Care  ?Interventions Coordination of Care Coordination of Care  ?Acuity Level 2-Minimal Needs (1-2 Barriers Identified) Level 2-Minimal Needs (1-2 Barriers Identified)  ?Time Spent with Patient 15 15  ?  ?

## 2022-01-05 NOTE — Progress Notes (Signed)
Oncology Nurse Navigator Documentation ? ? ?  01/05/2022  ?  3:00 PM 01/05/2022  ?  2:00 PM 12/30/2021  ?  9:00 AM  ?Oncology Nurse Navigator Flowsheets  ?Abnormal Finding Date 11/23/2021    ?Confirmed Diagnosis Date 12/20/2021    ?Diagnosis Status Confirmed Diagnosis Complete    ?Navigator Follow Up Date: 01/06/2022    ?Navigator Follow Up Reason: Review Note    ?Navigator Location [facility]  ?Referral Date to RadOnc/MedOnc 12/27/2021    ?Navigator Encounter Type Clinic/MDC;Initial MedOnc Other: Other:  ?Patient Visit Type Initial;MedOnc    ?Treatment Phase Pre-Tx/Tx Discussion    ?Barriers/Navigation Needs Education Coordination of Care Coordination of Care  ?Education Newly Diagnosed Cancer Education;Understanding Cancer/ Treatment Options;Other/I met patient and her husband today.  Very nice couple. Treatment plan is dependent on thoracic surgery decision and she has an appt tomorrow with them.  If not a surgical candidate, she will have concurrent chemo rad.  I did add information on lung cancer dx to AVS.  I also added chemo information and explained that just in case she will be getting systemic therapy.      ?Interventions Education;Psycho-Social Support Coordination of Care Coordination of Care  ?Acuity Level 3-Moderate Needs (3-4 Barriers Identified) Level 2-Minimal Needs (1-2 Barriers Identified) Level 2-Minimal Needs (1-2 Barriers Identified)  ?Time Spent with Patient 30 15 15  ?  ?

## 2022-01-05 NOTE — Patient Instructions (Addendum)
Lung Cancer ?Lung cancer is an abnormal growth of cancerous cells that forms a mass (malignant tumor) in a lung. There are several types of lung cancer. The types are based on the appearance of the tumor cells. The two most common types are: ?Non-small cell lung cancer. This type of lung cancer is the most common type. Non-small cell lung cancers include squamous cell carcinoma, adenocarcinoma, and large cell carcinoma. ?Small cell lung cancer. In this type of lung cancer, abnormal cells are smaller than those of non-small cell lung cancer. Small cell lung cancer gets worse (progresses) faster than non-small cell lung cancer. ?What are the causes? ?The most common cause of lung cancer is smoking tobacco. The second most common cause is exposure to a chemical called radon. ?What increases the risk? ?You are more likely to develop this condition if: ?You smoke tobacco. ?You have been exposed to: ?Secondhand tobacco smoke. ?Radon gas. ?Uranium. ?Asbestos. ?Arsenic in drinking water. ?Air pollution and diesel exhaust. ?You have a family or personal history of lung cancer. ?You have had lung radiation therapy in the past. ?You are older than age 34. ?What are the signs or symptoms? ?In the early stages, you may not have any symptoms. As the cancer progresses, symptoms may include: ?A lasting cough, possibly with blood. ?Fatigue. ?Unexplained weight loss. ?Shortness of breath. ?High-pitched whistling sounds when you breathe, most often when you breathe out (wheezing). ?Chest pain. ?Loss of appetite. ?Symptoms of advanced lung cancer include: ?Hoarseness. ?Bone or joint pain. ?Weakness. ?Change in the structure of the fingernails (clubbing), so that the nail looks like an upside-down spoon. ?Swelling of the face or arms. ?Inability to move the face (paralysis). ?Drooping eyelids. ?How is this diagnosed? ?This condition may be diagnosed based on: ?Your symptoms and medical history. ?A physical exam. ?A chest X-ray. ?A CT  scan. ?Blood tests. ?Sputum tests. ?Removal of a sample of lung tissue (lung biopsy) for testing. ?Your cancer will be assessed (staged) to determine how severe it is and how much it has spread (metastasized). ?How is this treated? ?Treatment depends on the type and stage of your cancer. Treatment may include one or more of the following: ?Surgery to remove as much of the cancer as possible. Lymph nodes in the area may be removed and tested for cancer as well. ?Medicines that kill cancer cells (chemotherapy). ?High-energy rays that kill cancer cells (radiation therapy). ?Targeted therapy. This targets specific parts of cancer cells and the area around them to block the growth and spread of the cancer. Targeted therapy can help limit the damage to healthy cells. ?Immunotherapy. This treatment uses a person's own immune system to fight cancer by either boosting the immune system or changing how the immune system works. ?Follow these instructions at home: ? ?Do not use any products that contain nicotine or tobacco. These products include cigarettes, chewing tobacco, and vaping devices, such as e-cigarettes. If you need help quitting, ask your health care provider. ?Do not drink alcohol. ?If you are admitted to the hospital, make sure your cancer specialist (oncologist) is aware. Your cancer may affect your treatment for other conditions. ?Take over-the-counter and prescription medicines only as told by your health care provider. ?Work with your health care provider to manage any side effects of treatment. ?Keep all follow-up visits. This is important. ?Where to find support ?Consider joining a local support group for people who have been diagnosed with lung cancer. ?Where to find more information ?American Cancer Society: www.cancer.org ?[place]  www.cancer.gov ?Contact a health care provider if you: ?Lose weight without trying. ?Have a persistent cough and wheezing. ?Feel short of breath. ?Get  tired easily. ?Have bone or joint pain. ?Have difficulty swallowing. ?Notice that your voice is changing or getting hoarse. ?Have pain that does not get better with medicine. ?Get help right away if you: ?Cough up blood. ?Have chest pain or new breathing problems. ?Have a fever. ?Have swelling in an ankle, leg, or arm, or the face or neck. ?Have paralysis in your face. ?Are very confused. ?Have a drooping eyelid. ?These symptoms may represent a serious problem that is an emergency. Do not wait to see if the symptoms will go away. Get medical help right away. Call your local emergency services (911 in the [place] Do not drive yourself to the hospital. ?Summary ?Lung cancer is an abnormal growth of cancerous cells that forms a mass (malignant tumor) in a lung. ?There are several types of lung cancer. The types are based on the appearance of the tumor cells. The two most common types are non-small cell and small cell. ?The most common cause of lung cancer is smoking tobacco. ?Early symptoms include a lasting cough, possibly with blood, and fatigue, unexplained weight loss, and shortness of breath. ?After diagnosis, treatment depends on the type and stage of your cancer. ?This information is not intended to replace advice given to you by your health care provider. Make sure you discuss any questions you have with your health care provider. ?Document Revised: 03/23/2021 Document Reviewed: 03/23/2021 ?Elsevier Patient Education ? [place]. ?Paclitaxel injection ?What is this medication? ?PACLITAXEL (PAK li TAX el) is a chemotherapy drug. It targets fast dividing cells, like cancer cells, and causes these cells to die. This medicine is used to treat ovarian cancer, breast cancer, lung cancer, Kaposi's sarcoma, and other cancers. ?This medicine may be used for other purposes; ask your health care provider or pharmacist if you have questions. ?COMMON BRAND NAME(S): Onxol, Taxol ?What should I tell my care team before  I take this medication? ?They need to know if you have any of these conditions: ?history of irregular heartbeat ?liver disease ?low blood counts, like low white cell, platelet, or red cell counts ?lung or breathing disease, like asthma ?tingling of the fingers or toes, or other nerve disorder ?an unusual or allergic reaction to paclitaxel, alcohol, polyoxyethylated castor oil, other chemotherapy, other medicines, foods, dyes, or preservatives ?pregnant or trying to get pregnant ?breast-feeding ?How should I use this medication? ?This drug is given as an infusion into a vein. It is administered in a hospital or clinic by a specially trained health care professional. ?Talk to your pediatrician regarding the use of this medicine in children. Special care may be needed. ?Overdosage: If you think you have taken too much of this medicine contact a poison control center or emergency room at once. ?NOTE: This medicine is only for you. Do not share this medicine with others. ?What if I miss a dose? ?It is important not to miss your dose. Call your doctor or health care professional if you are unable to keep an appointment. ?What may interact with this medication? ?Do not take this medicine with any of the following medications: ?live virus vaccines ?This medicine may also interact with the following medications: ?antiviral medicines for hepatitis, HIV or AIDS ?certain antibiotics like erythromycin and clarithromycin ?certain medicines for fungal infections like ketoconazole and itraconazole ?certain medicines for seizures like carbamazepine, phenobarbital, phenytoin ?gemfibrozil ?nefazodone ?rifampin ?St. John's  wort ?This list may not describe all possible interactions. Give your health care provider a list of all the medicines, herbs, non-prescription drugs, or dietary supplements you use. Also tell them if you smoke, drink alcohol, or use illegal drugs. Some items may interact with your medicine. ?What should I watch for  while using this medication? ?Your condition will be monitored carefully while you are receiving this medicine. You will need important blood work done while you are taking this medicine. ?This medicin

## 2022-01-06 ENCOUNTER — Institutional Professional Consult (permissible substitution) (INDEPENDENT_AMBULATORY_CARE_PROVIDER_SITE_OTHER): Payer: 59 | Admitting: Thoracic Surgery (Cardiothoracic Vascular Surgery)

## 2022-01-06 ENCOUNTER — Encounter: Payer: Self-pay | Admitting: *Deleted

## 2022-01-06 VITALS — BP 129/79 | HR 72 | Resp 20 | Ht 66.0 in | Wt 144.0 lb

## 2022-01-06 DIAGNOSIS — C3491 Malignant neoplasm of unspecified part of right bronchus or lung: Secondary | ICD-10-CM

## 2022-01-06 NOTE — Progress Notes (Signed)
Oncology Nurse Navigator Documentation ? ? ?  01/06/2022  ?  2:00 PM 01/05/2022  ?  3:00 PM 01/05/2022  ?  2:00 PM 12/30/2021  ?  9:00 AM  ?Oncology Nurse Navigator Flowsheets  ?Abnormal Finding Date  11/23/2021    ?Confirmed Diagnosis Date  12/20/2021    ?Diagnosis Status  Confirmed Diagnosis Complete    ?Navigator Follow Up Date:  01/06/2022    ?Navigator Follow Up Reason:  Review Note    ?Navigator Location [facility]  ?Referral Date to RadOnc/MedOnc  12/27/2021    ?Navigator Encounter Type Appt/Treatment Plan Review Clinic/MDC;Initial MedOnc Other: Other:  ?Patient Visit Type  Initial;MedOnc    ?Treatment Phase Pre-Tx/Tx Discussion Pre-Tx/Tx Discussion    ?Barriers/Navigation Needs Coordination of Care/I followed up on Dr. Abran Duke note.  After his discussion, patient will think about options.  Will follow up with her at a later time.  Education Coordination of Care Coordination of Care  ?Education  Newly Diagnosed Cancer Education;Understanding Cancer/ Treatment Options;Other    ?Interventions Coordination of Care Education;Psycho-Social Support Coordination of Care Coordination of Care  ?Acuity Level 2-Minimal Needs (1-2 Barriers Identified) Level 3-Moderate Needs (3-4 Barriers Identified) Level 2-Minimal Needs (1-2 Barriers Identified) Level 2-Minimal Needs (1-2 Barriers Identified)  ?Coordination of Care Other     ?Time Spent with Patient 30 30 15 15   ?  ?

## 2022-01-08 ENCOUNTER — Encounter: Payer: Self-pay | Admitting: Dermatology

## 2022-01-08 ENCOUNTER — Encounter: Payer: Self-pay | Admitting: Internal Medicine

## 2022-01-09 ENCOUNTER — Other Ambulatory Visit: Payer: Self-pay | Admitting: Internal Medicine

## 2022-01-09 ENCOUNTER — Encounter: Payer: Self-pay | Admitting: Internal Medicine

## 2022-01-09 ENCOUNTER — Telehealth: Payer: Self-pay | Admitting: Internal Medicine

## 2022-01-09 ENCOUNTER — Telehealth: Payer: Self-pay | Admitting: Pulmonary Disease

## 2022-01-09 ENCOUNTER — Encounter: Payer: Self-pay | Admitting: Radiation Oncology

## 2022-01-09 ENCOUNTER — Encounter: Payer: Self-pay | Admitting: *Deleted

## 2022-01-09 DIAGNOSIS — C3491 Malignant neoplasm of unspecified part of right bronchus or lung: Secondary | ICD-10-CM

## 2022-01-09 MED ORDER — PROCHLORPERAZINE MALEATE 10 MG PO TABS: 10.0000 mg | ORAL_TABLET | Freq: Four times a day (QID) | ORAL | 0 refills | Status: DC | PRN

## 2022-01-09 NOTE — Progress Notes (Signed)
START ON PATHWAY REGIMEN - Non-Small Cell Lung ? ? ?  Administer weekly: ?    Paclitaxel  ?    Carboplatin  ? ?**Always confirm dose/schedule in your pharmacy ordering system** ? ?Patient Characteristics: ?Preoperative or Nonsurgical Candidate (Clinical Staging), Stage II, Nonsurgical Candidate ?Therapeutic Status: Preoperative or Nonsurgical Candidate (Clinical Staging) ?AJCC T Category: cT2a ?AJCC N Category: cN1 ?AJCC M Category: cM0 ?AJCC 8 Stage Grouping: IIB ?Intent of Therapy: ?Curative Intent, Discussed with Patient ?

## 2022-01-09 NOTE — Progress Notes (Signed)
Oncology Nurse Navigator Documentation ? ? ?  01/09/2022  ?  4:00 PM 01/06/2022  ?  2:00 PM 01/05/2022  ?  3:00 PM 01/05/2022  ?  2:00 PM 12/30/2021  ?  9:00 AM  ?Oncology Nurse Navigator Flowsheets  ?Abnormal Finding Date   11/23/2021    ?Confirmed Diagnosis Date   12/20/2021    ?Diagnosis Status   Confirmed Diagnosis Complete    ?Navigator Follow Up Date: 01/23/2022  01/06/2022    ?Navigator Follow Up Reason: Follow-up Appointment  Review Note    ?Navigator Location [facility]  ?Referral Date to RadOnc/MedOnc   12/27/2021    ?Navigator Encounter Type Appt/Treatment Plan Review Appt/Treatment Plan Review Clinic/MDC;Initial MedOnc Other: Other:  ?Patient Visit Type   Initial;MedOnc    ?Treatment Phase Pre-Tx/Tx Discussion Pre-Tx/Tx Discussion Pre-Tx/Tx Discussion    ?Barriers/Navigation Needs Coordination of Care/I followed up on Ms. Weidmann tx plan. She is set up with [facility] this week and systemic therapy next week.  Coordination of Care Education Coordination of Care Coordination of Care  ?Education   Newly Diagnosed Cancer Education;Understanding Cancer/ Treatment Options;Other    ?Interventions Coordination of Care Coordination of Care Education;Psycho-Social Support Coordination of Care Coordination of Care  ?Acuity Level 2-Minimal Needs (1-2 Barriers Identified) Level 2-Minimal Needs (1-2 Barriers Identified) Level 3-Moderate Needs (3-4 Barriers Identified) Level 2-Minimal Needs (1-2 Barriers Identified) Level 2-Minimal Needs (1-2 Barriers Identified)  ?Coordination of Care Other Other     ?Time Spent with Patient 15 30 30 15 15   ?  ?

## 2022-01-09 NOTE — Telephone Encounter (Signed)
I spoke with pt and relayed that Dr. Julien Nordmann shared the results of her PET, specifically impression number 2: ?"2. Nonspecific mildly hypermetabolic inguinal lymph nodes bilaterally. These are unlikely reflect metastases and are probably reactive. Correlate clinically". ? ?Per Dr. Julien Nordmann, this is note felt to be related to her dx. ?

## 2022-01-09 NOTE — Telephone Encounter (Signed)
Disability form sent to Dr. Valeta Harms for signature.  We need to be sure to include medications list when faxing signed form to insurance. ?

## 2022-01-09 NOTE — Telephone Encounter (Signed)
Scheduled appointment per inbasket message. Patient aware.   ?

## 2022-01-10 MED ORDER — VTAMA 1 % EX CREA: 1.0000 "application " | TOPICAL_CREAM | Freq: Every day | CUTANEOUS | 3 refills | Status: DC

## 2022-01-10 NOTE — Addendum Note (Signed)
Addended by: Ailene Rud on: 01/10/2022 08:50 AM ? ? Modules accepted: Orders ? ?

## 2022-01-11 ENCOUNTER — Other Ambulatory Visit: Payer: Self-pay

## 2022-01-11 ENCOUNTER — Ambulatory Visit
Admission: RE | Admit: 2022-01-11 | Discharge: 2022-01-11 | Disposition: A | Discharge status: Home / Self Care | Payer: 59 | Source: Ambulatory Visit | Attending: Radiation Oncology | Admitting: Radiation Oncology

## 2022-01-11 ENCOUNTER — Encounter: Payer: Self-pay | Admitting: *Deleted

## 2022-01-11 ENCOUNTER — Inpatient Hospital Stay: Payer: 59

## 2022-01-11 DIAGNOSIS — C3431 Malignant neoplasm of lower lobe, right bronchus or lung: Secondary | ICD-10-CM | POA: Diagnosis not present

## 2022-01-11 DIAGNOSIS — R590 Localized enlarged lymph nodes: Secondary | ICD-10-CM | POA: Insufficient documentation

## 2022-01-11 NOTE — Progress Notes (Signed)
Oncology Nurse Navigator Documentation ? ? ?  01/11/2022  ?  9:00 AM 01/09/2022  ?  4:00 PM 01/06/2022  ?  2:00 PM 01/05/2022  ?  3:00 PM 01/05/2022  ?  2:00 PM 12/30/2021  ?  9:00 AM  ?Oncology Nurse Navigator Flowsheets  ?Abnormal Finding Date    11/23/2021    ?Confirmed Diagnosis Date    12/20/2021    ?Diagnosis Status    Confirmed Diagnosis Complete    ?Navigator Follow Up Date:  01/23/2022  01/06/2022    ?Navigator Follow Up Reason:  Follow-up Appointment  Review Note    ?Navigator Location [facility]  ?Referral Date to RadOnc/MedOnc    12/27/2021    ?Navigator Encounter Type Appt/Treatment Plan Review Appt/Treatment Plan Review Appt/Treatment Plan Review Clinic/MDC;Initial MedOnc Other: Other:  ?Patient Visit Type    Initial;MedOnc    ?Treatment Phase Pre-Tx/Tx Discussion Pre-Tx/Tx Discussion Pre-Tx/Tx Discussion Pre-Tx/Tx Discussion    ?Barriers/Navigation Needs Coordination of Care/I followed up on Diane [name] treatment plan appts. She is scheduled for her concurrent chemo rad at this time.  Coordination of Care Coordination of Care Education Coordination of Care Coordination of Care  ?Education    Newly Diagnosed Cancer Education;Understanding Cancer/ Treatment Options;Other    ?Interventions Coordination of Care Coordination of Care Coordination of Care Education;Psycho-Social Support Coordination of Care Coordination of Care  ?Acuity Level 2-Minimal Needs (1-2 Barriers Identified) Level 2-Minimal Needs (1-2 Barriers Identified) Level 2-Minimal Needs (1-2 Barriers Identified) Level 3-Moderate Needs (3-4 Barriers Identified) Level 2-Minimal Needs (1-2 Barriers Identified) Level 2-Minimal Needs (1-2 Barriers Identified)  ?Coordination of Care  Other Other     ?Time Spent with Patient 15 15 30 30 15 15   ?  ?

## 2022-01-12 ENCOUNTER — Telehealth: Payer: Self-pay | Admitting: Internal Medicine

## 2022-01-12 NOTE — Telephone Encounter (Signed)
Per Darilyn's note on 3/27, "Disability form sent to Dr. Valeta Harms for signature.  We need to be sure to include medications list when faxing signed form to insurance." ? ?Completed forms have been faxed along with med list, PFT results, and office notes to [facility]. Left voicemail for patient to inform they have been completed.  ? ?

## 2022-01-12 NOTE — Telephone Encounter (Signed)
Rescheduled appointment per 3/30 secure chat. Patient is aware of the changes made to her upcoming appointment. ?

## 2022-01-13 ENCOUNTER — Encounter (HOSPITAL_COMMUNITY): Payer: Self-pay

## 2022-01-15 DIAGNOSIS — C3491 Malignant neoplasm of unspecified part of right bronchus or lung: Secondary | ICD-10-CM | POA: Insufficient documentation

## 2022-01-15 DIAGNOSIS — Z5111 Encounter for antineoplastic chemotherapy: Secondary | ICD-10-CM | POA: Insufficient documentation

## 2022-01-15 DIAGNOSIS — Z79899 Other long term (current) drug therapy: Secondary | ICD-10-CM | POA: Insufficient documentation

## 2022-01-15 DIAGNOSIS — C3431 Malignant neoplasm of lower lobe, right bronchus or lung: Secondary | ICD-10-CM | POA: Diagnosis present

## 2022-01-16 ENCOUNTER — Ambulatory Visit: Payer: 59

## 2022-01-16 ENCOUNTER — Inpatient Hospital Stay: Payer: 59

## 2022-01-16 ENCOUNTER — Other Ambulatory Visit: Payer: Self-pay

## 2022-01-16 ENCOUNTER — Inpatient Hospital Stay: Payer: 59 | Attending: Internal Medicine

## 2022-01-16 ENCOUNTER — Ambulatory Visit
Admission: RE | Admit: 2022-01-16 | Discharge: 2022-01-16 | Disposition: A | Discharge status: Home / Self Care | Payer: 59 | Source: Ambulatory Visit | Attending: Radiation Oncology | Admitting: Radiation Oncology

## 2022-01-16 VITALS — BP 108/69 | HR 68 | Temp 98.6°F | Resp 18 | Wt 143.5 lb

## 2022-01-16 DIAGNOSIS — Z5111 Encounter for antineoplastic chemotherapy: Secondary | ICD-10-CM | POA: Insufficient documentation

## 2022-01-16 DIAGNOSIS — Z79899 Other long term (current) drug therapy: Secondary | ICD-10-CM | POA: Insufficient documentation

## 2022-01-16 DIAGNOSIS — C3491 Malignant neoplasm of unspecified part of right bronchus or lung: Secondary | ICD-10-CM

## 2022-01-16 DIAGNOSIS — C3431 Malignant neoplasm of lower lobe, right bronchus or lung: Secondary | ICD-10-CM | POA: Insufficient documentation

## 2022-01-16 LAB — CBC WITH DIFFERENTIAL (CANCER CENTER ONLY)
Abs Immature Granulocytes: 0.03 10*3/uL (ref 0.00–0.07)
Basophils Absolute: 0 10*3/uL (ref 0.0–0.1)
Basophils Relative: 0 %
Eosinophils Absolute: 0.9 10*3/uL — ABNORMAL HIGH (ref 0.0–0.5)
Eosinophils Relative: 11 %
HCT: 35.2 % — ABNORMAL LOW (ref 36.0–46.0)
Hemoglobin: 11.8 g/dL — ABNORMAL LOW (ref 12.0–15.0)
Immature Granulocytes: 0 %
Lymphocytes Relative: 14 %
Lymphs Abs: 1.2 10*3/uL (ref 0.7–4.0)
MCH: 31.1 pg (ref 26.0–34.0)
MCHC: 33.5 g/dL (ref 30.0–36.0)
MCV: 92.6 fL (ref 80.0–100.0)
Monocytes Absolute: 0.6 10*3/uL (ref 0.1–1.0)
Monocytes Relative: 7 %
Neutro Abs: 5.8 10*3/uL (ref 1.7–7.7)
Neutrophils Relative %: 68 %
Platelet Count: 373 10*3/uL (ref 150–400)
RBC: 3.8 MIL/uL — ABNORMAL LOW (ref 3.87–5.11)
RDW: 12.6 % (ref 11.5–15.5)
WBC Count: 8.5 10*3/uL (ref 4.0–10.5)
nRBC: 0 % (ref 0.0–0.2)

## 2022-01-16 LAB — CMP (CANCER CENTER ONLY)
ALT: 11 U/L (ref 0–44)
AST: 14 U/L — ABNORMAL LOW (ref 15–41)
Albumin: 3.8 g/dL (ref 3.5–5.0)
Alkaline Phosphatase: 69 U/L (ref 38–126)
Anion gap: 9 (ref 5–15)
BUN: 13 mg/dL (ref 8–23)
CO2: 26 mmol/L (ref 22–32)
Calcium: 9.2 mg/dL (ref 8.9–10.3)
Chloride: 100 mmol/L (ref 98–111)
Creatinine: 0.61 mg/dL (ref 0.44–1.00)
GFR, Estimated: >60 mL/min (ref >60)
Glucose, Bld: 120 mg/dL — ABNORMAL HIGH (ref 70–99)
Potassium: 3.7 mmol/L (ref 3.5–5.1)
Sodium: 135 mmol/L (ref 135–145)
Total Bilirubin: 0.4 mg/dL (ref 0.3–1.2)
Total Protein: 8 g/dL (ref 6.5–8.1)

## 2022-01-16 MED ORDER — PALONOSETRON HCL INJECTION 0.25 MG/5ML
0.2500 mg | Freq: Once | INTRAVENOUS | Status: AC
  Administered 2022-01-16: 0.25 mg via INTRAVENOUS
  Filled 2022-01-16: qty 5

## 2022-01-16 MED ORDER — SODIUM CHLORIDE 0.9 % IV SOLN
45.0000 mg/m2 | Freq: Once | INTRAVENOUS | Status: AC
  Administered 2022-01-16: 78 mg via INTRAVENOUS
  Filled 2022-01-16: qty 13

## 2022-01-16 MED ORDER — SODIUM CHLORIDE 0.9 % IV SOLN
10.0000 mg | Freq: Once | INTRAVENOUS | Status: AC
  Administered 2022-01-16: 10 mg via INTRAVENOUS
  Filled 2022-01-16: qty 10

## 2022-01-16 MED ORDER — SODIUM CHLORIDE 0.9 % IV SOLN: Freq: Once | INTRAVENOUS | Status: AC

## 2022-01-16 MED ORDER — DIPHENHYDRAMINE HCL 50 MG/ML IJ SOLN
50.0000 mg | Freq: Once | INTRAMUSCULAR | Status: AC
  Administered 2022-01-16: 50 mg via INTRAVENOUS
  Filled 2022-01-16: qty 1

## 2022-01-16 MED ORDER — FAMOTIDINE IN NACL 20-0.9 MG/50ML-% IV SOLN
20.0000 mg | Freq: Once | INTRAVENOUS | Status: AC
  Administered 2022-01-16: 20 mg via INTRAVENOUS
  Filled 2022-01-16: qty 50

## 2022-01-16 MED ORDER — SODIUM CHLORIDE 0.9 % IV SOLN
196.4000 mg | Freq: Once | INTRAVENOUS | Status: AC
  Administered 2022-01-16: 200 mg via INTRAVENOUS
  Filled 2022-01-16: qty 20

## 2022-01-16 NOTE — Patient Instructions (Signed)
[facility]  Discharge Instructions: Thank you for choosing [facility] to provide your oncology and hematology care.   If you have a lab appointment with the Cancer Center, please go directly to the Cancer Center and check in at the registration area.   Wear comfortable clothing and clothing appropriate for easy access to any Portacath or PICC line.   We strive to give you quality time with your provider. You may need to reschedule your appointment if you arrive late (15 or more minutes).  Arriving late affects you and other patients whose appointments are after yours.  Also, if you miss three or more appointments without notifying the office, you may be dismissed from the clinic at the provider's discretion.      For prescription refill requests, have your pharmacy contact our office and allow 72 hours for refills to be completed.    Today you received the following chemotherapy and/or immunotherapy agents: Paclitaxel (Taxol) and Carboplatin.   To help prevent nausea and vomiting after your treatment, we encourage you to take your nausea medication as directed.  BELOW ARE SYMPTOMS THAT SHOULD BE REPORTED IMMEDIATELY: *FEVER GREATER THAN 100.4 F (38 C) OR HIGHER *CHILLS OR SWEATING *NAUSEA AND VOMITING THAT IS NOT CONTROLLED WITH YOUR NAUSEA MEDICATION *UNUSUAL SHORTNESS OF BREATH *UNUSUAL BRUISING OR BLEEDING *URINARY PROBLEMS (pain or burning when urinating, or frequent urination) *BOWEL PROBLEMS (unusual diarrhea, constipation, pain near the anus) TENDERNESS IN MOUTH AND THROAT WITH OR WITHOUT PRESENCE OF ULCERS (sore throat, sores in mouth, or a toothache) UNUSUAL RASH, SWELLING OR PAIN  UNUSUAL VAGINAL DISCHARGE OR ITCHING   Items with * indicate a potential emergency and should be followed up as soon as possible or go to the Emergency Department if any problems should occur.  Please show the CHEMOTHERAPY ALERT CARD or IMMUNOTHERAPY  ALERT CARD at check-in to the Emergency Department and triage nurse.  Should you have questions after your visit or need to cancel or reschedule your appointment, please contact [facility]  Dept: [phone]  and follow the prompts.  Office hours are 8:00 a.m. to 4:30 p.m. Monday - Friday. Please note that voicemails left after 4:00 p.m. may not be returned until the following business day.  We are closed weekends and major holidays. You have access to a nurse at all times for urgent questions. Please call the main number to the clinic Dept: [phone] and follow the prompts.   For any non-urgent questions, you may also contact your provider using MyChart. We now offer e-Visits for anyone 18 and older to request care online for non-urgent symptoms. For details visit [email or web address].   Also download the MyChart app! Go to the app store, search "MyChart", open the app, select [facility], and log in with your MyChart username and password.  Due to Covid, a mask is required upon entering the hospital/clinic. If you do not have a mask, one will be given to you upon arrival. For doctor visits, patients may have 1 support person aged 18 or older with them. For treatment visits, patients cannot have anyone with them due to current Covid guidelines and our immunocompromised population.   

## 2022-01-17 ENCOUNTER — Telehealth: Payer: Self-pay | Admitting: *Deleted

## 2022-01-17 ENCOUNTER — Ambulatory Visit
Admission: RE | Admit: 2022-01-17 | Discharge: 2022-01-17 | Disposition: A | Discharge status: Home / Self Care | Payer: 59 | Source: Ambulatory Visit | Attending: Radiation Oncology | Admitting: Radiation Oncology

## 2022-01-17 ENCOUNTER — Ambulatory Visit: Payer: 59 | Admitting: Radiation Oncology

## 2022-01-17 ENCOUNTER — Ambulatory Visit: Payer: 59

## 2022-01-17 DIAGNOSIS — Z5111 Encounter for antineoplastic chemotherapy: Secondary | ICD-10-CM | POA: Diagnosis not present

## 2022-01-17 NOTE — Telephone Encounter (Signed)
-----   Message from Diane Gilbert, RN sent at 01/16/2022  2:21 PM EDT ----- ?Regarding: First Time Taxol and Carbo - Dr. Julien Nordmann Patient ?First Time Taxol and Carbo - Dr. Julien Nordmann Patient  ?Patient tolerated treatment well.  ? ?

## 2022-01-17 NOTE — Telephone Encounter (Signed)
Called pt to see how she did with her treatment & she reports doing well with no concerns.  She knows how to reach [place] if needed & knows her next appts.  ?

## 2022-01-18 ENCOUNTER — Ambulatory Visit
Admission: RE | Admit: 2022-01-18 | Discharge: 2022-01-18 | Disposition: A | Discharge status: Home / Self Care | Payer: 59 | Source: Ambulatory Visit | Attending: Radiation Oncology | Admitting: Radiation Oncology

## 2022-01-18 ENCOUNTER — Other Ambulatory Visit: Payer: Self-pay

## 2022-01-18 DIAGNOSIS — Z5111 Encounter for antineoplastic chemotherapy: Secondary | ICD-10-CM | POA: Diagnosis not present

## 2022-01-19 ENCOUNTER — Ambulatory Visit
Admission: RE | Admit: 2022-01-19 | Discharge: 2022-01-19 | Disposition: A | Discharge status: Home / Self Care | Payer: 59 | Source: Ambulatory Visit | Attending: Radiation Oncology | Admitting: Radiation Oncology

## 2022-01-19 DIAGNOSIS — Z5111 Encounter for antineoplastic chemotherapy: Secondary | ICD-10-CM | POA: Diagnosis not present

## 2022-01-20 ENCOUNTER — Other Ambulatory Visit: Payer: Self-pay

## 2022-01-20 ENCOUNTER — Ambulatory Visit
Admission: RE | Admit: 2022-01-20 | Discharge: 2022-01-20 | Disposition: A | Discharge status: Home / Self Care | Payer: 59 | Source: Ambulatory Visit | Attending: Radiation Oncology | Admitting: Radiation Oncology

## 2022-01-20 DIAGNOSIS — C3491 Malignant neoplasm of unspecified part of right bronchus or lung: Secondary | ICD-10-CM

## 2022-01-20 DIAGNOSIS — Z5111 Encounter for antineoplastic chemotherapy: Secondary | ICD-10-CM | POA: Diagnosis not present

## 2022-01-20 MED ORDER — SONAFINE EX EMUL
1.0000 "application " | Freq: Once | CUTANEOUS | Status: AC
  Administered 2022-01-20: 1 via TOPICAL

## 2022-01-20 NOTE — Progress Notes (Signed)
Pt here for patient teaching.  Pt given Radiation and You booklet, skin care instructions, and Sonafine.  Reviewed areas of pertinence such as fatigue, hair loss, skin changes, throat changes, cough, and shortness of breath . Pt able to give teach back of to pat skin and use unscented/gentle soap,apply Sonafine bid and avoid applying anything to skin within 4 hours of treatment. Pt verbalizes understanding of information given and will contact nursing with any questions or concerns.   ? ?Gloriajean Dell. Quincy Simmonds RN, BSN  ? ?

## 2022-01-23 ENCOUNTER — Other Ambulatory Visit: Payer: Self-pay

## 2022-01-23 ENCOUNTER — Ambulatory Visit
Admission: RE | Admit: 2022-01-23 | Discharge: 2022-01-23 | Disposition: A | Discharge status: Home / Self Care | Payer: 59 | Source: Ambulatory Visit | Attending: Radiation Oncology | Admitting: Radiation Oncology

## 2022-01-23 ENCOUNTER — Inpatient Hospital Stay: Payer: 59

## 2022-01-23 ENCOUNTER — Inpatient Hospital Stay (HOSPITAL_BASED_OUTPATIENT_CLINIC_OR_DEPARTMENT_OTHER): Payer: 59 | Admitting: Internal Medicine

## 2022-01-23 VITALS — BP 116/63 | HR 83 | Temp 97.6°F | Resp 19 | Ht 66.0 in | Wt 142.6 lb

## 2022-01-23 DIAGNOSIS — C3491 Malignant neoplasm of unspecified part of right bronchus or lung: Secondary | ICD-10-CM | POA: Diagnosis not present

## 2022-01-23 DIAGNOSIS — Z5111 Encounter for antineoplastic chemotherapy: Secondary | ICD-10-CM | POA: Diagnosis not present

## 2022-01-23 LAB — CBC WITH DIFFERENTIAL (CANCER CENTER ONLY)
Abs Immature Granulocytes: 0.04 10*3/uL (ref 0.00–0.07)
Basophils Absolute: 0.1 10*3/uL (ref 0.0–0.1)
Basophils Relative: 1 %
Eosinophils Absolute: 0.9 10*3/uL — ABNORMAL HIGH (ref 0.0–0.5)
Eosinophils Relative: 15 %
HCT: 32.9 % — ABNORMAL LOW (ref 36.0–46.0)
Hemoglobin: 11 g/dL — ABNORMAL LOW (ref 12.0–15.0)
Immature Granulocytes: 1 %
Lymphocytes Relative: 11 %
Lymphs Abs: 0.7 10*3/uL (ref 0.7–4.0)
MCH: 31.3 pg (ref 26.0–34.0)
MCHC: 33.4 g/dL (ref 30.0–36.0)
MCV: 93.5 fL (ref 80.0–100.0)
Monocytes Absolute: 0.3 10*3/uL (ref 0.1–1.0)
Monocytes Relative: 5 %
Neutro Abs: 4.1 10*3/uL (ref 1.7–7.7)
Neutrophils Relative %: 67 %
Platelet Count: 375 10*3/uL (ref 150–400)
RBC: 3.52 MIL/uL — ABNORMAL LOW (ref 3.87–5.11)
RDW: 12.5 % (ref 11.5–15.5)
WBC Count: 6.1 10*3/uL (ref 4.0–10.5)
nRBC: 0 % (ref 0.0–0.2)

## 2022-01-23 LAB — CMP (CANCER CENTER ONLY)
ALT: 10 U/L (ref 0–44)
AST: 12 U/L — ABNORMAL LOW (ref 15–41)
Albumin: 3.8 g/dL (ref 3.5–5.0)
Alkaline Phosphatase: 68 U/L (ref 38–126)
Anion gap: 8 (ref 5–15)
BUN: 17 mg/dL (ref 8–23)
CO2: 27 mmol/L (ref 22–32)
Calcium: 9.3 mg/dL (ref 8.9–10.3)
Chloride: 103 mmol/L (ref 98–111)
Creatinine: 0.66 mg/dL (ref 0.44–1.00)
GFR, Estimated: >60 mL/min
Glucose, Bld: 122 mg/dL — ABNORMAL HIGH (ref 70–99)
Potassium: 3.8 mmol/L (ref 3.5–5.1)
Sodium: 138 mmol/L (ref 135–145)
Total Bilirubin: 0.2 mg/dL — ABNORMAL LOW (ref 0.3–1.2)
Total Protein: 7.8 g/dL (ref 6.5–8.1)

## 2022-01-23 MED ORDER — DIPHENHYDRAMINE HCL 50 MG/ML IJ SOLN
50.0000 mg | Freq: Once | INTRAMUSCULAR | Status: AC
  Administered 2022-01-23: 50 mg via INTRAVENOUS
  Filled 2022-01-23: qty 1

## 2022-01-23 MED ORDER — FAMOTIDINE IN NACL 20-0.9 MG/50ML-% IV SOLN
20.0000 mg | Freq: Once | INTRAVENOUS | Status: AC
  Administered 2022-01-23: 20 mg via INTRAVENOUS
  Filled 2022-01-23: qty 50

## 2022-01-23 MED ORDER — SODIUM CHLORIDE 0.9 % IV SOLN: Freq: Once | INTRAVENOUS | Status: AC

## 2022-01-23 MED ORDER — SODIUM CHLORIDE 0.9 % IV SOLN
196.4000 mg | Freq: Once | INTRAVENOUS | Status: AC
  Administered 2022-01-23: 200 mg via INTRAVENOUS
  Filled 2022-01-23: qty 20

## 2022-01-23 MED ORDER — SODIUM CHLORIDE 0.9 % IV SOLN
10.0000 mg | Freq: Once | INTRAVENOUS | Status: AC
  Administered 2022-01-23: 10 mg via INTRAVENOUS
  Filled 2022-01-23: qty 10

## 2022-01-23 MED ORDER — SODIUM CHLORIDE 0.9 % IV SOLN
45.0000 mg/m2 | Freq: Once | INTRAVENOUS | Status: AC
  Administered 2022-01-23: 78 mg via INTRAVENOUS
  Filled 2022-01-23: qty 13

## 2022-01-23 MED ORDER — PALONOSETRON HCL INJECTION 0.25 MG/5ML
0.2500 mg | Freq: Once | INTRAVENOUS | Status: AC
  Administered 2022-01-23: 0.25 mg via INTRAVENOUS
  Filled 2022-01-23: qty 5

## 2022-01-23 NOTE — Telephone Encounter (Signed)
Forms faxed on 3/30 along with PFT results and ov notes. Nothing further needed. ?

## 2022-01-23 NOTE — Progress Notes (Signed)
?    [place] ?Telephone:(336) [phone]   [phone] ? ?OFFICE PROGRESS NOTE ? ?Glenda Chroman, MD ?[address] ?[place] [address] ? ?DIAGNOSIS: Unresectable stage IIB (T2a, N1, M0) non-small cell lung cancer, squamous cell carcinoma presented with right lower lobe lung mass in addition to right hilar lymphadenopathy diagnosed in March 2023.   ? ?The patient had PD-L1 expression of 99%. ? ?PRIOR THERAPY: None ? ?CURRENT THERAPY: A course of concurrent chemoradiation with weekly carboplatin for AUC of 2 and paclitaxel 45 Mg/M2.  First dose started 01/16/2022.  Status post 1 cycle. ? ?INTERVAL HISTORY: ?Diane [name] 66 y.o. female returns to the clinic today for follow-up visit.  The patient is feeling fine today with no concerning complaints.  She tolerated the first week of her treatment fairly well except for 1 episode of nausea 2 days after the treatment.  She denied having any current chest pain, shortness of breath, cough or hemoptysis.  She denied having any fever or chills.  She has no nausea, vomiting, diarrhea or constipation.  She has no headache or visual changes.  She has no weight loss or night sweats.  She is here today for evaluation before starting cycle #2 of her treatment. ? ?MEDICAL HISTORY: ?Past Medical History:  ?Diagnosis Date  ? Anxiety   ? Arthritis   ? fingers, left foot  ? C. difficile diarrhea 2015  ? COPD (chronic obstructive pulmonary disease) ([place])   ? Depression   ? Eczema   ? H pylori ulcer   ? Headache(784.0)   ? Hypercholesterolemia   ? Hypertension   ? Kidney stones   ? 20 years ago  ? Pneumonia   ? 5 years ago  ? Seizures ([place])   ? 2 years ago, "cluster of seizures" none since  ? Shingles   ? ? ?ALLERGIES:  is allergic to alpha-gal, other, penicillins, keppra [levetiracetam], and prozac [fluoxetine]. ? ?MEDICATIONS:  ?Current Outpatient Medications  ?Medication Sig Dispense Refill  ? acitretin (SORIATANE) 25 MG capsule TAKE ONE CAPSULE BY MOUTH DAILY  (Patient taking differently: Take 25 mg by mouth every other day.) 30 capsule 3  ? albuterol (PROVENTIL) (2.5 MG/3ML) 0.083% nebulizer solution Take 3 mLs (2.5 mg total) by nebulization every 6 (six) hours as needed for wheezing or shortness of breath. (Patient not taking: Reported on 01/06/2022) 75 mL 12  ? albuterol (VENTOLIN HFA) 108 (90 Base) MCG/ACT inhaler Inhale 2 puffs into the lungs every 6 (six) hours as needed for wheezing or shortness of breath. (Patient not taking: Reported on 01/06/2022) 8 g 2  ? ALPRAZolam (XANAX) 0.5 MG tablet Take 0.5 mg by mouth daily as needed.    ? cetirizine (ZYRTEC) 10 MG tablet Take 10 mg by mouth at bedtime.    ? clobetasol ointment (TEMOVATE) 0.05 % APPLY TWICE DAILY (Patient taking differently: 1 application. daily as needed (Skin condition).) 60 g 0  ? EPINEPHrine (AUVI-Q) 0.3 mg/0.3 mL IJ SOAJ injection Inject 0.3 mLs (0.3 mg total) into the muscle as needed for anaphylaxis. 2 each 1  ? fluticasone (FLONASE) 50 MCG/ACT nasal spray Place 2 sprays into both nostrils daily. (Patient taking differently: Place 2 sprays into both nostrils daily as needed for allergies.) 16 g 5  ? hydrochlorothiazide (MICROZIDE) 12.5 MG capsule Take 12.5 mg by mouth daily.    ? nicotine (NICODERM CQ - DOSED IN MG/24 HR) 7 mg/24hr patch Place 7 mg onto the skin daily.    ? omeprazole (  PRILOSEC) 20 MG capsule Take 1 capsule (20 mg total) by mouth 2 (two) times daily. (Needs to be seen before next refill) 60 capsule 0  ? OVER THE COUNTER MEDICATION Take 1 capsule by mouth daily. Probiotic    ? prochlorperazine (COMPAZINE) 10 MG tablet Take 1 tablet (10 mg total) by mouth every 6 (six) hours as needed for nausea or vomiting. 30 tablet 0  ? rosuvastatin (CRESTOR) 10 MG tablet Take 10 mg by mouth at bedtime.    ? Tapinarof (VTAMA) 1 % CREA Apply 1 application. topically daily. 60 g 3  ? Tiotropium Bromide-Olodaterol (STIOLTO RESPIMAT) 2.5-2.5 MCG/ACT AERS Inhale into the lungs.    ? ?No current  facility-administered medications for this visit.  ? ? ?SURGICAL HISTORY:  ?Past Surgical History:  ?Procedure Laterality Date  ? ABDOMINAL HYSTERECTOMY    ? ADENOIDECTOMY    ? APPENDECTOMY    ? BRONCHIAL BIOPSY  12/20/2021  ? Procedure: BRONCHIAL BIOPSIES;  Surgeon: Garner Nash, DO;  Location: [facility];  Service: Pulmonary;;  ? BRONCHIAL BRUSHINGS  12/20/2021  ? Procedure: BRONCHIAL BRUSHINGS;  Surgeon: Garner Nash, DO;  Location: [facility];  Service: Pulmonary;;  ? BRONCHIAL NEEDLE ASPIRATION BIOPSY  12/20/2021  ? Procedure: BRONCHIAL NEEDLE ASPIRATION BIOPSIES;  Surgeon: Garner Nash, DO;  Location: [facility];  Service: Pulmonary;;  ? CESAREAN SECTION    ? CHOLECYSTECTOMY    ? COLONOSCOPY  06/10/2012  ? RMR: Colonic polyps -removed as described above.   ? FINE NEEDLE ASPIRATION  12/20/2021  ? Procedure: FINE NEEDLE ASPIRATION (FNA) LINEAR;  Surgeon: Garner Nash, DO;  Location: [facility];  Service: Pulmonary;;  ? KNEE ARTHROSCOPY  1973  ? right knee,  torn cart  ? [place] SURGERY  05/12/2015  ? L4 L5  ? LUMBAR LAMINECTOMY/DECOMPRESSION MICRODISCECTOMY Left 05/12/2015  ? Procedure: Left L4-5 Microdiscectomy;  Surgeon: Marybelle Killings, MD;  Location: [facility];  Service: Orthopedics;  Laterality: Left;  ? TONSILLECTOMY    ? VIDEO BRONCHOSCOPY WITH ENDOBRONCHIAL ULTRASOUND Bilateral 12/20/2021  ? Procedure: VIDEO BRONCHOSCOPY WITH ENDOBRONCHIAL ULTRASOUND;  Surgeon: Garner Nash, DO;  Location: [facility];  Service: Pulmonary;  Laterality: Bilateral;  ? VIDEO BRONCHOSCOPY WITH RADIAL ENDOBRONCHIAL ULTRASOUND  12/20/2021  ? Procedure: RADIAL ENDOBRONCHIAL ULTRASOUND;  Surgeon: Garner Nash, DO;  Location: [facility];  Service: Pulmonary;;  ? ? ?REVIEW OF SYSTEMS:  A comprehensive review of systems was negative.  ? ?PHYSICAL EXAMINATION: General appearance: alert, cooperative, and no distress ?Head: Normocephalic, without obvious abnormality, atraumatic ?Neck: no adenopathy, no JVD, supple,  symmetrical, trachea midline, and thyroid not enlarged, symmetric, no tenderness/mass/nodules ?Lymph nodes: Cervical, supraclavicular, and axillary nodes normal. ?Resp: clear to auscultation bilaterally ?Back: symmetric, no curvature. ROM normal. No CVA tenderness. ?Cardio: regular rate and rhythm, S1, S2 normal, no murmur, click, rub or gallop ?GI: soft, non-tender; bowel sounds normal; no masses,  no organomegaly ?Extremities: extremities normal, atraumatic, no cyanosis or edema ? ?ECOG PERFORMANCE STATUS: 1 - Symptomatic but completely ambulatory ? ?Blood pressure 116/63, pulse 83, temperature 97.6 ?F (36.4 ?C), temperature source Tympanic, resp. rate 19, height _0  (1.676 m), weight 142 lb 9.6 oz (64.7 kg), SpO2 95 %. ? ?LABORATORY DATA: ?Lab Results  ?Component Value Date  ? WBC 6.1 01/23/2022  ? HGB 11.0 (L) 01/23/2022  ? HCT 32.9 (L) 01/23/2022  ? MCV 93.5 01/23/2022  ? PLT 375 01/23/2022  ? ? ?  Chemistry   ?   ?Component Value Date/Time  ?  NA 135 01/16/2022 0839  ? NA 139 12/03/2019 0928  ? K 3.7 01/16/2022 0839  ? CL 100 01/16/2022 0839  ? CO2 26 01/16/2022 0839  ? BUN 13 01/16/2022 0839  ? BUN 14 12/03/2019 0928  ? CREATININE 0.61 01/16/2022 0839  ? CREATININE 0.64 10/14/2021 0953  ?    ?Component Value Date/Time  ? CALCIUM 9.2 01/16/2022 0839  ? ALKPHOS 69 01/16/2022 0839  ? AST 14 (L) 01/16/2022 0839  ? ALT 11 01/16/2022 0839  ? BILITOT 0.4 01/16/2022 0839  ?  ? ? ? ?RADIOGRAPHIC STUDIES: ?MR BRAIN W WO CONTRAST ? ?Result Date: 01/05/2022 ?CLINICAL DATA:  Non-small cell lung carcinoma EXAM: MRI HEAD WITHOUT AND WITH CONTRAST TECHNIQUE: Multiplanar, multiecho pulse sequences of the brain and surrounding structures were obtained without and with intravenous contrast. CONTRAST:  6m GADAVIST GADOBUTROL 1 MMOL/ML IV SOLN COMPARISON:  11/06/2012 FINDINGS: Brain: No acute infarct, mass effect or extra-axial collection. No acute or chronic hemorrhage. Normal white matter signal, parenchymal volume and CSF  spaces. The midline structures are normal. There is no abnormal contrast enhancement. Vascular: Major flow voids are preserved. Skull and upper cervical spine: Normal calvarium and skull base. Visualized upper c

## 2022-01-23 NOTE — Patient Instructions (Signed)
[facility]ICAL [facility]COL[facility]  Discharge Instructions: Thank you for choosing [facility] to provide your oncology and hematology care.   If you have a lab appointment with the Cancer Center, please go directly to the Cancer Center and check in at the registration area.   Wear comfortable clothing and clothing appropriate for easy access to any Portacath or PICC line.   We strive to give you quality time with your provider. You may need to reschedule your appointment if you arrive late (15 or more minutes).  Arriving late affects you and other patients whose appointments are after yours.  Also, if you miss three or more appointments without notifying the office, you may be dismissed from the clinic at the provider's discretion.      For prescription refill requests, have your pharmacy contact our office and allow 72 hours for refills to be completed.    Today you received the following chemotherapy and/or immunotherapy agents: Paclitaxel (Taxol) and Carboplatin.   To help prevent nausea and vomiting after your treatment, we encourage you to take your nausea medication as directed.  BELOW ARE SYMPTOMS THAT SHOULD BE REPORTED IMMEDIATELY: *FEVER GREATER THAN 100.4 F (38 C) OR HIGHER *CHILLS OR SWEATING *NAUSEA AND VOMITING THAT IS NOT CONTROLLED WITH YOUR NAUSEA MEDICATION *UNUSUAL SHORTNESS OF BREATH *UNUSUAL BRUISING OR BLEEDING *URINARY PROBLEMS (pain or burning when urinating, or frequent urination) *BOWEL PROBLEMS (unusual diarrhea, constipation, pain near the anus) TENDERNESS IN MOUTH AND THROAT WITH OR WITHOUT PRESENCE OF ULCERS (sore throat, sores in mouth, or a toothache) UNUSUAL RASH, SWELLING OR PAIN  UNUSUAL VAGINAL DISCHARGE OR ITCHING   Items with * indicate a potential emergency and should be followed up as soon as possible or go to the Emergency Department if any problems should occur.  Please show the CHEMOTHERAPY ALERT CARD or IMMUNOTHERAPY  ALERT CARD at check-in to the Emergency Department and triage nurse.  Should you have questions after your visit or need to cancel or reschedule your appointment, please contact [facility]  Dept: [phone]  and follow the prompts.  Office hours are 8:00 a.m. to 4:30 p.m. Monday - Friday. Please note that voicemails left after 4:00 p.m. may not be returned until the following business day.  We are closed weekends and major holidays. You have access to a nurse at all times for urgent questions. Please call the main number to the clinic Dept: [phone] and follow the prompts.   For any non-urgent questions, you may also contact your provider using MyChart. We now offer e-Visits for anyone 18 and older to request care online for non-urgent symptoms. For details visit [email or web address].   Also download the MyChart app! Go to the app store, search "MyChart", open the app, select [facility], and log in with your MyChart username and password.  Due to Covid, a mask is required upon entering the hospital/clinic. If you do not have a mask, one will be given to you upon arrival. For doctor visits, patients may have 1 support Giovani Neumeister aged 18 or older with them. For treatment visits, patients cannot have anyone with them due to current Covid guidelines and our immunocompromised population.   

## 2022-01-24 ENCOUNTER — Ambulatory Visit
Admission: RE | Admit: 2022-01-24 | Discharge: 2022-01-24 | Disposition: A | Discharge status: Home / Self Care | Payer: 59 | Source: Ambulatory Visit | Attending: Radiation Oncology | Admitting: Radiation Oncology

## 2022-01-24 DIAGNOSIS — Z5111 Encounter for antineoplastic chemotherapy: Secondary | ICD-10-CM | POA: Diagnosis not present

## 2022-01-24 MED ORDER — TIOTROPIUM BROMIDE-OLODATEROL 2.5-2.5 MCG/ACT IN AERS
2.0000 | INHALATION_SPRAY | Freq: Every day | RESPIRATORY_TRACT | 3 refills | Status: DC
Start: 2022-01-24 — End: 2022-03-27

## 2022-01-25 ENCOUNTER — Ambulatory Visit
Admission: RE | Admit: 2022-01-25 | Discharge: 2022-01-25 | Disposition: A | Discharge status: Home / Self Care | Payer: 59 | Source: Ambulatory Visit | Attending: Radiation Oncology | Admitting: Radiation Oncology

## 2022-01-25 ENCOUNTER — Other Ambulatory Visit: Payer: Self-pay

## 2022-01-25 DIAGNOSIS — Z5111 Encounter for antineoplastic chemotherapy: Secondary | ICD-10-CM | POA: Diagnosis not present

## 2022-01-26 ENCOUNTER — Ambulatory Visit
Admission: RE | Admit: 2022-01-26 | Discharge: 2022-01-26 | Disposition: A | Discharge status: Home / Self Care | Payer: 59 | Source: Ambulatory Visit | Attending: Radiation Oncology | Admitting: Radiation Oncology

## 2022-01-26 DIAGNOSIS — Z5111 Encounter for antineoplastic chemotherapy: Secondary | ICD-10-CM | POA: Diagnosis not present

## 2022-01-27 ENCOUNTER — Other Ambulatory Visit: Payer: Self-pay

## 2022-01-27 ENCOUNTER — Ambulatory Visit
Admission: RE | Admit: 2022-01-27 | Discharge: 2022-01-27 | Disposition: A | Discharge status: Home / Self Care | Payer: 59 | Source: Ambulatory Visit | Attending: Radiation Oncology | Admitting: Radiation Oncology

## 2022-01-27 DIAGNOSIS — Z5111 Encounter for antineoplastic chemotherapy: Secondary | ICD-10-CM | POA: Diagnosis not present

## 2022-01-30 ENCOUNTER — Inpatient Hospital Stay: Payer: 59

## 2022-01-30 ENCOUNTER — Other Ambulatory Visit: Payer: Self-pay

## 2022-01-30 ENCOUNTER — Ambulatory Visit
Admission: RE | Admit: 2022-01-30 | Discharge: 2022-01-30 | Disposition: A | Discharge status: Home / Self Care | Payer: 59 | Source: Ambulatory Visit | Attending: Radiation Oncology | Admitting: Radiation Oncology

## 2022-01-30 VITALS — BP 119/63 | HR 76 | Temp 98.1°F | Resp 16 | Wt 142.0 lb

## 2022-01-30 DIAGNOSIS — C3491 Malignant neoplasm of unspecified part of right bronchus or lung: Secondary | ICD-10-CM

## 2022-01-30 DIAGNOSIS — Z5111 Encounter for antineoplastic chemotherapy: Secondary | ICD-10-CM | POA: Diagnosis not present

## 2022-01-30 LAB — CBC WITH DIFFERENTIAL (CANCER CENTER ONLY)
Abs Immature Granulocytes: 0.02 10*3/uL (ref 0.00–0.07)
Basophils Absolute: 0 10*3/uL (ref 0.0–0.1)
Basophils Relative: 1 %
Eosinophils Absolute: 0.4 10*3/uL (ref 0.0–0.5)
Eosinophils Relative: 9 %
HCT: 34.9 % — ABNORMAL LOW (ref 36.0–46.0)
Hemoglobin: 11.8 g/dL — ABNORMAL LOW (ref 12.0–15.0)
Immature Granulocytes: 1 %
Lymphocytes Relative: 15 %
Lymphs Abs: 0.6 10*3/uL — ABNORMAL LOW (ref 0.7–4.0)
MCH: 31.8 pg (ref 26.0–34.0)
MCHC: 33.8 g/dL (ref 30.0–36.0)
MCV: 94.1 fL (ref 80.0–100.0)
Monocytes Absolute: 0.3 10*3/uL (ref 0.1–1.0)
Monocytes Relative: 7 %
Neutro Abs: 2.7 10*3/uL (ref 1.7–7.7)
Neutrophils Relative %: 67 %
Platelet Count: 320 10*3/uL (ref 150–400)
RBC: 3.71 MIL/uL — ABNORMAL LOW (ref 3.87–5.11)
RDW: 12.7 % (ref 11.5–15.5)
WBC Count: 4 10*3/uL (ref 4.0–10.5)
nRBC: 0 % (ref 0.0–0.2)

## 2022-01-30 LAB — CMP (CANCER CENTER ONLY)
ALT: 12 U/L (ref 0–44)
AST: 17 U/L (ref 15–41)
Albumin: 4 g/dL (ref 3.5–5.0)
Alkaline Phosphatase: 63 U/L (ref 38–126)
Anion gap: 7 (ref 5–15)
BUN: 15 mg/dL (ref 8–23)
CO2: 27 mmol/L (ref 22–32)
Calcium: 9.3 mg/dL (ref 8.9–10.3)
Chloride: 101 mmol/L (ref 98–111)
Creatinine: 0.69 mg/dL (ref 0.44–1.00)
GFR, Estimated: >60 mL/min (ref >60)
Glucose, Bld: 97 mg/dL (ref 70–99)
Potassium: 3.8 mmol/L (ref 3.5–5.1)
Sodium: 135 mmol/L (ref 135–145)
Total Bilirubin: 0.3 mg/dL (ref 0.3–1.2)
Total Protein: 8.1 g/dL (ref 6.5–8.1)

## 2022-01-30 MED ORDER — ALTEPLASE 2 MG IJ SOLR: 2.0000 mg | Freq: Once | INTRAMUSCULAR | Status: DC | PRN

## 2022-01-30 MED ORDER — SODIUM CHLORIDE 0.9 % IV SOLN
196.4000 mg | Freq: Once | INTRAVENOUS | Status: AC
  Administered 2022-01-30: 200 mg via INTRAVENOUS
  Filled 2022-01-30: qty 20

## 2022-01-30 MED ORDER — FAMOTIDINE IN NACL 20-0.9 MG/50ML-% IV SOLN
20.0000 mg | Freq: Once | INTRAVENOUS | Status: AC
  Administered 2022-01-30: 20 mg via INTRAVENOUS
  Filled 2022-01-30: qty 50

## 2022-01-30 MED ORDER — DIPHENHYDRAMINE HCL 50 MG/ML IJ SOLN
50.0000 mg | Freq: Once | INTRAMUSCULAR | Status: AC
  Administered 2022-01-30: 50 mg via INTRAVENOUS
  Filled 2022-01-30: qty 1

## 2022-01-30 MED ORDER — PALONOSETRON HCL INJECTION 0.25 MG/5ML
0.2500 mg | Freq: Once | INTRAVENOUS | Status: AC
  Administered 2022-01-30: 0.25 mg via INTRAVENOUS
  Filled 2022-01-30: qty 5

## 2022-01-30 MED ORDER — SODIUM CHLORIDE 0.9 % IV SOLN
45.0000 mg/m2 | Freq: Once | INTRAVENOUS | Status: AC
  Administered 2022-01-30: 78 mg via INTRAVENOUS
  Filled 2022-01-30: qty 13

## 2022-01-30 MED ORDER — SODIUM CHLORIDE 0.9 % IV SOLN
10.0000 mg | Freq: Once | INTRAVENOUS | Status: AC
  Administered 2022-01-30: 10 mg via INTRAVENOUS
  Filled 2022-01-30: qty 10

## 2022-01-30 MED ORDER — SODIUM CHLORIDE 0.9 % IV SOLN: Freq: Once | INTRAVENOUS | Status: AC

## 2022-01-30 MED ORDER — SODIUM CHLORIDE 0.9% FLUSH: 10.0000 mL | INTRAVENOUS | Status: DC | PRN

## 2022-01-30 NOTE — Patient Instructions (Signed)
[facility]  Discharge Instructions: Thank you for choosing [facility] to provide your oncology and hematology care.   If you have a lab appointment with the Cancer Center, please go directly to the Cancer Center and check in at the registration area.   Wear comfortable clothing and clothing appropriate for easy access to any Portacath or PICC line.   We strive to give you quality time with your provider. You may need to reschedule your appointment if you arrive late (15 or more minutes).  Arriving late affects you and other patients whose appointments are after yours.  Also, if you miss three or more appointments without notifying the office, you may be dismissed from the clinic at the provider's discretion.      For prescription refill requests, have your pharmacy contact our office and allow 72 hours for refills to be completed.    Today you received the following chemotherapy and/or immunotherapy agents: Paclitaxel (Taxol) and Carboplatin.   To help prevent nausea and vomiting after your treatment, we encourage you to take your nausea medication as directed.  BELOW ARE SYMPTOMS THAT SHOULD BE REPORTED IMMEDIATELY: *FEVER GREATER THAN 100.4 F (38 C) OR HIGHER *CHILLS OR SWEATING *NAUSEA AND VOMITING THAT IS NOT CONTROLLED WITH YOUR NAUSEA MEDICATION *UNUSUAL SHORTNESS OF BREATH *UNUSUAL BRUISING OR BLEEDING *URINARY PROBLEMS (pain or burning when urinating, or frequent urination) *BOWEL PROBLEMS (unusual diarrhea, constipation, pain near the anus) TENDERNESS IN MOUTH AND THROAT WITH OR WITHOUT PRESENCE OF ULCERS (sore throat, sores in mouth, or a toothache) UNUSUAL RASH, SWELLING OR PAIN  UNUSUAL VAGINAL DISCHARGE OR ITCHING   Items with * indicate a potential emergency and should be followed up as soon as possible or go to the Emergency Department if any problems should occur.  Please show the CHEMOTHERAPY ALERT CARD or IMMUNOTHERAPY  ALERT CARD at check-in to the Emergency Department and triage nurse.  Should you have questions after your visit or need to cancel or reschedule your appointment, please contact [facility]  Dept: [phone]  and follow the prompts.  Office hours are 8:00 a.m. to 4:30 p.m. Monday - Friday. Please note that voicemails left after 4:00 p.m. may not be returned until the following business day.  We are closed weekends and major holidays. You have access to a nurse at all times for urgent questions. Please call the main number to the clinic Dept: [phone] and follow the prompts.   For any non-urgent questions, you may also contact your provider using MyChart. We now offer e-Visits for anyone 18 and older to request care online for non-urgent symptoms. For details visit [email or web address].   Also download the MyChart app! Go to the app store, search "MyChart", open the app, select [facility], and log in with your MyChart username and password.  Due to Covid, a mask is required upon entering the hospital/clinic. If you do not have a mask, one will be given to you upon arrival. For doctor visits, patients may have 1 support person aged 18 or older with them. For treatment visits, patients cannot have anyone with them due to current Covid guidelines and our immunocompromised population.   

## 2022-01-31 ENCOUNTER — Ambulatory Visit
Admission: RE | Admit: 2022-01-31 | Discharge: 2022-01-31 | Disposition: A | Discharge status: Home / Self Care | Payer: 59 | Source: Ambulatory Visit | Attending: Radiation Oncology | Admitting: Radiation Oncology

## 2022-01-31 ENCOUNTER — Other Ambulatory Visit: Payer: Self-pay

## 2022-01-31 DIAGNOSIS — Z5111 Encounter for antineoplastic chemotherapy: Secondary | ICD-10-CM | POA: Diagnosis not present

## 2022-01-31 LAB — RAD ONC ARIA SESSION SUMMARY
Course Elapsed Days: 15
Plan Fractions Treated to Date: 12
Plan Prescribed Dose Per Fraction: 2 Gy
Plan Total Fractions Prescribed: 30
Plan Total Prescribed Dose: 60 Gy
Reference Point Dosage Given to Date: 24 Gy
Reference Point Session Dosage Given: 2 Gy
Session Number: 12

## 2022-02-01 ENCOUNTER — Ambulatory Visit
Admission: RE | Admit: 2022-02-01 | Discharge: 2022-02-01 | Disposition: A | Discharge status: Home / Self Care | Payer: 59 | Source: Ambulatory Visit | Attending: Radiation Oncology | Admitting: Radiation Oncology

## 2022-02-01 ENCOUNTER — Other Ambulatory Visit: Payer: Self-pay

## 2022-02-01 DIAGNOSIS — Z5111 Encounter for antineoplastic chemotherapy: Secondary | ICD-10-CM | POA: Diagnosis not present

## 2022-02-01 LAB — RAD ONC ARIA SESSION SUMMARY
Course Elapsed Days: 16
Plan Fractions Treated to Date: 13
Plan Prescribed Dose Per Fraction: 2 Gy
Plan Total Fractions Prescribed: 30
Plan Total Prescribed Dose: 60 Gy
Reference Point Dosage Given to Date: 26 Gy
Reference Point Session Dosage Given: 2 Gy
Session Number: 13

## 2022-02-02 ENCOUNTER — Other Ambulatory Visit: Payer: Self-pay

## 2022-02-02 ENCOUNTER — Ambulatory Visit
Admission: RE | Admit: 2022-02-02 | Discharge: 2022-02-02 | Disposition: A | Discharge status: Home / Self Care | Payer: 59 | Source: Ambulatory Visit | Attending: Radiation Oncology | Admitting: Radiation Oncology

## 2022-02-02 DIAGNOSIS — Z5111 Encounter for antineoplastic chemotherapy: Secondary | ICD-10-CM | POA: Diagnosis not present

## 2022-02-02 LAB — RAD ONC ARIA SESSION SUMMARY
Course Elapsed Days: 17
Plan Fractions Treated to Date: 14
Plan Prescribed Dose Per Fraction: 2 Gy
Plan Total Fractions Prescribed: 30
Plan Total Prescribed Dose: 60 Gy
Reference Point Dosage Given to Date: 28 Gy
Reference Point Session Dosage Given: 2 Gy
Session Number: 14

## 2022-02-03 ENCOUNTER — Other Ambulatory Visit: Payer: Self-pay

## 2022-02-03 ENCOUNTER — Ambulatory Visit
Admission: RE | Admit: 2022-02-03 | Discharge: 2022-02-03 | Disposition: A | Discharge status: Home / Self Care | Payer: 59 | Source: Ambulatory Visit | Attending: Radiation Oncology | Admitting: Radiation Oncology

## 2022-02-03 DIAGNOSIS — Z5111 Encounter for antineoplastic chemotherapy: Secondary | ICD-10-CM | POA: Diagnosis not present

## 2022-02-03 LAB — RAD ONC ARIA SESSION SUMMARY
Course Elapsed Days: 18
Plan Fractions Treated to Date: 15
Plan Prescribed Dose Per Fraction: 2 Gy
Plan Total Fractions Prescribed: 30
Plan Total Prescribed Dose: 60 Gy
Reference Point Dosage Given to Date: 30 Gy
Reference Point Session Dosage Given: 2 Gy
Session Number: 15

## 2022-02-03 NOTE — Progress Notes (Signed)
[facility] ?OFFICE PROGRESS NOTE ? ?Diane Chroman, MD ?[address] ?[place] [address] ? ?DIAGNOSIS: Unresectable stage IIB (T2a, N1, M0) non-small cell lung cancer, squamous cell carcinoma presented with right lower lobe lung mass in addition to right hilar lymphadenopathy diagnosed in March 2023.   ?  ?The patient had PD-L1 expression of 99%. ? ?PRIOR THERAPY: None  ? ?CURRENT THERAPY: A course of concurrent chemoradiation with weekly carboplatin for AUC of 2 and paclitaxel 45 Mg/M2.  First dose started 01/16/2022.  Status post 3 cycles. ? ?INTERVAL HISTORY: ?Diane [name] 66 y.o. female returns to the clinic today for a follow-up visit.  The patient is feeling fairly well today without any concerning complaints.  She is currently undergoing weekly concurrent chemoradiation with carboplatin and paclitaxel.  She is tolerating this well without any concerning adverse side effects except for nausea/vomiting routinely on the Wednesday following treatment. Her nausea does improve with her anti-emetic, she just sometimes has a hard time staying on top of her nausea. Moving forward, she is going to try to take her anti-emetic Tuesday nights and Wednesday mornings. She also has some mild fatigue. Her last day radiation is tentatively scheduled for 03/01/22.  She denies any significant odynophagia or dysphagia. She uses imodium which controls diarrhea. Denies any headache or visual changes.  Denies any fever, chills, night sweats, or unexplained weight loss.  Denies any chest pain, shortness of breath, or hemoptysis. She reports sometimes at night she has had a dry cough but not significant enough to warrant cough medication. Sometimes she will take mucinex. She is here today for evaluation and repeat blood work before starting cycle #4. ? ? ?MEDICAL HISTORY: ?Past Medical History:  ?Diagnosis Date  ? Anxiety   ? Arthritis   ? fingers, left foot  ? C. difficile diarrhea 2015  ? COPD (chronic obstructive  pulmonary disease) ([place])   ? Depression   ? Eczema   ? H pylori ulcer   ? Headache(784.0)   ? Hypercholesterolemia   ? Hypertension   ? Kidney stones   ? 20 years ago  ? Pneumonia   ? 5 years ago  ? Seizures ([place])   ? 2 years ago, "cluster of seizures" none since  ? Shingles   ? ? ?ALLERGIES:  is allergic to alpha-gal, other, penicillins, and prozac [fluoxetine]. ? ?MEDICATIONS:  ?Current Outpatient Medications  ?Medication Sig Dispense Refill  ? acitretin (SORIATANE) 25 MG capsule TAKE ONE CAPSULE BY MOUTH DAILY (Patient taking differently: Take 25 mg by mouth every other day.) 30 capsule 3  ? ALPRAZolam (XANAX) 0.5 MG tablet Take 0.5 mg by mouth daily as needed.    ? cetirizine (ZYRTEC) 10 MG tablet Take 10 mg by mouth at bedtime.    ? clobetasol ointment (TEMOVATE) 0.05 % APPLY TWICE DAILY (Patient taking differently: 1 application. daily as needed (Skin condition).) 60 g 0  ? EPINEPHrine (AUVI-Q) 0.3 mg/0.3 mL IJ SOAJ injection Inject 0.3 mLs (0.3 mg total) into the muscle as needed for anaphylaxis. 2 each 1  ? fluticasone (FLONASE) 50 MCG/ACT nasal spray Place 2 sprays into both nostrils daily. (Patient taking differently: Place 2 sprays into both nostrils daily as needed for allergies.) 16 g 5  ? hydrochlorothiazide (MICROZIDE) 12.5 MG capsule Take 12.5 mg by mouth daily.    ? omeprazole (PRILOSEC) 20 MG capsule Take 1 capsule (20 mg total) by mouth 2 (two) times daily. (Needs to be seen before next refill) 60 capsule 0  ?  OVER THE COUNTER MEDICATION Take 1 capsule by mouth daily. Probiotic    ? prochlorperazine (COMPAZINE) 10 MG tablet Take 1 tablet (10 mg total) by mouth every 6 (six) hours as needed for nausea or vomiting. 30 tablet 0  ? rosuvastatin (CRESTOR) 10 MG tablet Take 10 mg by mouth at bedtime.    ? Tapinarof (VTAMA) 1 % CREA Apply 1 application. topically daily. 60 g 3  ? Tiotropium Bromide-Olodaterol (STIOLTO RESPIMAT) 2.5-2.5 MCG/ACT AERS Inhale into the lungs.    ? Tiotropium  Bromide-Olodaterol 2.5-2.5 MCG/ACT AERS Inhale 2 puffs into the lungs daily. 4 g 3  ? albuterol (PROVENTIL) (2.5 MG/3ML) 0.083% nebulizer solution Take 3 mLs (2.5 mg total) by nebulization every 6 (six) hours as needed for wheezing or shortness of breath. (Patient not taking: Reported on 01/06/2022) 75 mL 12  ? albuterol (VENTOLIN HFA) 108 (90 Base) MCG/ACT inhaler Inhale 2 puffs into the lungs every 6 (six) hours as needed for wheezing or shortness of breath. (Patient not taking: Reported on 01/06/2022) 8 g 2  ? ?No current facility-administered medications for this visit.  ? ?Facility-Administered Medications Ordered in Other Visits  ?Medication Dose Route Frequency Provider Last Rate Last Admin  ? CARBOplatin (PARAPLATIN) 200 mg in sodium chloride 0.9 % 100 mL chemo infusion  200 mg Intravenous Once Curt Bears, MD      ? dexamethasone (DECADRON) 10 mg in sodium chloride 0.9 % 50 mL IVPB  10 mg Intravenous Once Curt Bears, MD      ? diphenhydrAMINE (BENADRYL) injection 50 mg  50 mg Intravenous Once Curt Bears, MD      ? famotidine (PEPCID) IVPB 20 mg premix  20 mg Intravenous Once Curt Bears, MD      ? PACLitaxel (TAXOL) 78 mg in sodium chloride 0.9 % 250 mL chemo infusion (</= 30m/m2)  45 mg/m2 (Treatment Plan Recorded) Intravenous Once MCurt Bears MD      ? palonosetron (ALOXI) injection 0.25 mg  0.25 mg Intravenous Once MCurt Bears MD      ? ? ?SURGICAL HISTORY:  ?Past Surgical History:  ?Procedure Laterality Date  ? ABDOMINAL HYSTERECTOMY    ? ADENOIDECTOMY    ? APPENDECTOMY    ? BRONCHIAL BIOPSY  12/20/2021  ? Procedure: BRONCHIAL BIOPSIES;  Surgeon: IGarner Nash DO;  [facility];  Service: Pulmonary;;  ? BRONCHIAL BRUSHINGS  12/20/2021  ? Procedure: BRONCHIAL BRUSHINGS;  Surgeon: IGarner Nash DO;  [facility]  Service: Pulmonary;;  ? BRONCHIAL NEEDLE ASPIRATION BIOPSY  12/20/2021  ? Procedure: BRONCHIAL NEEDLE ASPIRATION BIOPSIES;  Surgeon: IGarner Nash DO;  [facility];  Service: Pulmonary;;  ? CESAREAN SECTION    ? CHOLECYSTECTOMY    ? COLONOSCOPY  06/10/2012  ? RMR: Colonic polyps -removed as described above.   ? FINE NEEDLE ASPIRATION  12/20/2021  ? Procedure: FINE NEEDLE ASPIRATION (FNA) LINEAR;  Surgeon: IGarner Nash DO;  [facility];  Service: Pulmonary;;  ? KNEE ARTHROSCOPY  1973  ? right knee,  torn cart  [place] [facility]  [facility]  ? L4 L5  ? LUMBAR LAMINECTOMY/DECOMPRESSION MICRODISCECTOMY Left 05/12/2015  ? Procedure: Left L4-5 Microdiscectomy;  Surgeon: MMarybelle Killings MD;  [facility]  Service: Orthopedics;  Laterality: Left;  ? TONSILLECTOMY    ? VIDEO BRONCHOSCOPY WITH ENDOBRONCHIAL ULTRASOUND Bilateral 12/20/2021  ? Procedure: VIDEO BRONCHOSCOPY WITH ENDOBRONCHIAL ULTRASOUND;  Surgeon: IGarner Nash DO;  [facility]  Service: Pulmonary;  Laterality:  Bilateral;  ? VIDEO BRONCHOSCOPY WITH RADIAL ENDOBRONCHIAL ULTRASOUND  12/20/2021  ? Procedure: RADIAL ENDOBRONCHIAL ULTRASOUND;  Surgeon: Garner Nash, DO;  Location: [facility];  Service: Pulmonary;;  ? ? ?REVIEW OF SYSTEMS:   ?Review of Systems  ?Constitutional: Positive for mild fatigue. Negative for appetite change, chills, fever and unexpected weight change.  ?HENT: Negative for mouth sores, nosebleeds, sore throat and trouble swallowing.   ?Eyes: Negative for eye problems and icterus.  ?Respiratory: Negative for cough, hemoptysis, shortness of breath and wheezing.   ?Cardiovascular: Negative for chest pain and leg swelling.  ?Gastrointestinal: Positive for mild nausea/vomiting Wednesday following treatment. Negative for abdominal pain, constipation, or diarrhea.  ?Genitourinary: Negative for bladder incontinence, difficulty urinating, dysuria, frequency and hematuria.   ?Musculoskeletal: Negative for back pain, gait problem, neck pain and neck stiffness.  ?Skin: Negative for itching and rash.  ?Neurological: Negative for dizziness,  extremity weakness, gait problem, headaches, light-headedness and seizures.  ?Hematological: Negative for adenopathy. Does not bruise/bleed easily.  ?Psychiatric/Behavioral: Negative for confusion, depression and sleep d

## 2022-02-06 ENCOUNTER — Inpatient Hospital Stay: Payer: 59

## 2022-02-06 ENCOUNTER — Other Ambulatory Visit: Payer: Self-pay

## 2022-02-06 ENCOUNTER — Ambulatory Visit
Admission: RE | Admit: 2022-02-06 | Discharge: 2022-02-06 | Disposition: A | Discharge status: Home / Self Care | Payer: 59 | Source: Ambulatory Visit | Attending: Radiation Oncology | Admitting: Radiation Oncology

## 2022-02-06 ENCOUNTER — Inpatient Hospital Stay (HOSPITAL_BASED_OUTPATIENT_CLINIC_OR_DEPARTMENT_OTHER): Payer: 59 | Admitting: Physician Assistant

## 2022-02-06 ENCOUNTER — Encounter: Payer: Self-pay | Admitting: Physician Assistant

## 2022-02-06 VITALS — BP 100/60 | HR 80 | Temp 97.6°F | Wt 140.4 lb

## 2022-02-06 DIAGNOSIS — C3491 Malignant neoplasm of unspecified part of right bronchus or lung: Secondary | ICD-10-CM | POA: Diagnosis not present

## 2022-02-06 DIAGNOSIS — Z5111 Encounter for antineoplastic chemotherapy: Secondary | ICD-10-CM

## 2022-02-06 LAB — RAD ONC ARIA SESSION SUMMARY
Course Elapsed Days: 21
Plan Fractions Treated to Date: 16
Plan Prescribed Dose Per Fraction: 2 Gy
Plan Total Fractions Prescribed: 30
Plan Total Prescribed Dose: 60 Gy
Reference Point Dosage Given to Date: 32 Gy
Reference Point Session Dosage Given: 2 Gy
Session Number: 16

## 2022-02-06 LAB — CBC WITH DIFFERENTIAL (CANCER CENTER ONLY)
Abs Immature Granulocytes: 0.02 10*3/uL (ref 0.00–0.07)
Basophils Absolute: 0 10*3/uL (ref 0.0–0.1)
Basophils Relative: 1 %
Eosinophils Absolute: 0.2 10*3/uL (ref 0.0–0.5)
Eosinophils Relative: 5 %
HCT: 32.1 % — ABNORMAL LOW (ref 36.0–46.0)
Hemoglobin: 10.7 g/dL — ABNORMAL LOW (ref 12.0–15.0)
Immature Granulocytes: 1 %
Lymphocytes Relative: 16 %
Lymphs Abs: 0.6 10*3/uL — ABNORMAL LOW (ref 0.7–4.0)
MCH: 31.3 pg (ref 26.0–34.0)
MCHC: 33.3 g/dL (ref 30.0–36.0)
MCV: 93.9 fL (ref 80.0–100.0)
Monocytes Absolute: 0.4 10*3/uL (ref 0.1–1.0)
Monocytes Relative: 9 %
Neutro Abs: 2.7 10*3/uL (ref 1.7–7.7)
Neutrophils Relative %: 68 %
Platelet Count: 230 10*3/uL (ref 150–400)
RBC: 3.42 MIL/uL — ABNORMAL LOW (ref 3.87–5.11)
RDW: 12.8 % (ref 11.5–15.5)
Smear Review: NORMAL
WBC Count: 3.9 10*3/uL — ABNORMAL LOW (ref 4.0–10.5)
nRBC: 0 % (ref 0.0–0.2)

## 2022-02-06 LAB — CMP (CANCER CENTER ONLY)
ALT: 15 U/L (ref 0–44)
AST: 13 U/L — ABNORMAL LOW (ref 15–41)
Albumin: 3.8 g/dL (ref 3.5–5.0)
Alkaline Phosphatase: 68 U/L (ref 38–126)
Anion gap: 7 (ref 5–15)
BUN: 12 mg/dL (ref 8–23)
CO2: 31 mmol/L (ref 22–32)
Calcium: 9.2 mg/dL (ref 8.9–10.3)
Chloride: 100 mmol/L (ref 98–111)
Creatinine: 0.68 mg/dL (ref 0.44–1.00)
GFR, Estimated: >60 mL/min (ref >60)
Glucose, Bld: 83 mg/dL (ref 70–99)
Potassium: 3.3 mmol/L — ABNORMAL LOW (ref 3.5–5.1)
Sodium: 138 mmol/L (ref 135–145)
Total Bilirubin: 0.3 mg/dL (ref 0.3–1.2)
Total Protein: 7.6 g/dL (ref 6.5–8.1)

## 2022-02-06 MED ORDER — PALONOSETRON HCL INJECTION 0.25 MG/5ML
0.2500 mg | Freq: Once | INTRAVENOUS | Status: AC
  Administered 2022-02-06: 0.25 mg via INTRAVENOUS
  Filled 2022-02-06: qty 5

## 2022-02-06 MED ORDER — DIPHENHYDRAMINE HCL 50 MG/ML IJ SOLN
50.0000 mg | Freq: Once | INTRAMUSCULAR | Status: AC
  Administered 2022-02-06: 50 mg via INTRAVENOUS
  Filled 2022-02-06: qty 1

## 2022-02-06 MED ORDER — SODIUM CHLORIDE 0.9 % IV SOLN
196.4000 mg | Freq: Once | INTRAVENOUS | Status: AC
  Administered 2022-02-06: 200 mg via INTRAVENOUS
  Filled 2022-02-06: qty 20

## 2022-02-06 MED ORDER — SODIUM CHLORIDE 0.9 % IV SOLN
45.0000 mg/m2 | Freq: Once | INTRAVENOUS | Status: AC
  Administered 2022-02-06: 78 mg via INTRAVENOUS
  Filled 2022-02-06: qty 13

## 2022-02-06 MED ORDER — FAMOTIDINE IN NACL 20-0.9 MG/50ML-% IV SOLN
20.0000 mg | Freq: Once | INTRAVENOUS | Status: AC
  Administered 2022-02-06: 20 mg via INTRAVENOUS
  Filled 2022-02-06: qty 50

## 2022-02-06 MED ORDER — SODIUM CHLORIDE 0.9 % IV SOLN
10.0000 mg | Freq: Once | INTRAVENOUS | Status: AC
  Administered 2022-02-06: 10 mg via INTRAVENOUS
  Filled 2022-02-06: qty 10

## 2022-02-06 MED ORDER — SODIUM CHLORIDE 0.9 % IV SOLN: Freq: Once | INTRAVENOUS | Status: AC

## 2022-02-06 NOTE — Patient Instructions (Addendum)
[facility]  Discharge Instructions: ?Thank you for choosing [facility] to provide your oncology and hematology care.  ? ?If you have a lab appointment with the [facility], please go directly to the [facility] and check in at the registration area. ?  ?Wear comfortable clothing and clothing appropriate for easy access to any Portacath or PICC line.  ? ?We strive to give you quality time with your provider. You may need to reschedule your appointment if you arrive late (15 or more minutes).  Arriving late affects you and other patients whose appointments are after yours.  Also, if you miss three or more appointments without notifying the office, you may be dismissed from the clinic at the provider?s discretion.    ?  ?For prescription refill requests, have your pharmacy contact our office and allow 72 hours for refills to be completed.   ? ?Today you received the following chemotherapy and/or immunotherapy agents: Paclitaxel (Taxol) and Carboplatin. ?  ?To help prevent nausea and vomiting after your treatment, we encourage you to take your nausea medication as directed. ? ?BELOW ARE SYMPTOMS THAT SHOULD BE REPORTED IMMEDIATELY: ?*FEVER GREATER THAN 100.4 F (38 ?C) OR HIGHER ?*CHILLS OR SWEATING ?*NAUSEA AND VOMITING THAT IS NOT CONTROLLED WITH YOUR NAUSEA MEDICATION ?*UNUSUAL SHORTNESS OF BREATH ?*UNUSUAL BRUISING OR BLEEDING ?*URINARY PROBLEMS (pain or burning when urinating, or frequent urination) ?*BOWEL PROBLEMS (unusual diarrhea, constipation, pain near the anus) ?TENDERNESS IN MOUTH AND THROAT WITH OR WITHOUT PRESENCE OF ULCERS (sore throat, sores in mouth, or a toothache) ?UNUSUAL RASH, SWELLING OR PAIN  ?UNUSUAL VAGINAL DISCHARGE OR ITCHING  ? ?Items with * indicate a potential emergency and should be followed up as soon as possible or go to the Emergency Department if any problems should occur. ? ?Please show the CHEMOTHERAPY ALERT CARD or IMMUNOTHERAPY  ALERT CARD at check-in to the Emergency Department and triage nurse. ? ?Should you have questions after your visit or need to cancel or reschedule your appointment, please contact [facility]  Dept: [phone]  and follow the prompts.  Office hours are 8:00 a.m. to 4:30 p.m. Monday - Friday. Please note that voicemails left after 4:00 p.m. may not be returned until the following business day.  We are closed weekends and major holidays. You have access to a nurse at all times for urgent questions. Please call the main number to the clinic Dept: [phone] and follow the prompts. ? ? ?For any non-urgent questions, you may also contact your provider using MyChart. We now offer e-Visits for anyone 81 and older to request care online for non-urgent symptoms. For details visit [email or web address]. ?  ?Also download the MyChart app! Go to the app store, search "MyChart", open the app, select [facility], and log in with your MyChart username and password. ? ?Due to Covid, a mask is required upon entering the hospital/clinic. If you do not have a mask, one will be given to you upon arrival. For doctor visits, patients may have 1 support person aged 71 or older with them. For treatment visits, patients cannot have anyone with them due to current Covid guidelines and our immunocompromised population.  ? ?Hypokalemia ?Hypokalemia means that the amount of potassium in the blood is lower than normal. Potassium is a mineral (electrolyte) that helps regulate the amount of fluid in the body. It also stimulates muscle tightening (contraction) and helps nerves work properly. ?Normally, most of the body's potassium is inside  cells, and only a very small amount is in the blood. Because the amount in the blood is so small, minor changes to potassium levels in the blood can be life-threatening. ?What are the causes? ?This condition may be caused by: ?Antibiotic medicine. ?Diarrhea or vomiting. Taking  too much of a medicine that helps you have a bowel movement (laxative) can cause diarrhea and lead to hypokalemia. ?Chronic kidney disease (CKD). ?Medicines that help the body get rid of excess fluid (diuretics). ?Eating disorders, such as anorexia or bulimia. ?Low magnesium levels in the body. ?Sweating a lot. ?What are the signs or symptoms? ?Symptoms of this condition include: ?Weakness. ?Constipation. ?Fatigue. ?Muscle cramps. ?Mental confusion. ?Skipped heartbeats or irregular heartbeat (palpitations). ?Tingling or numbness. ?How is this diagnosed? ?This condition is diagnosed with a blood test. ?How is this treated? ?This condition may be treated by: ?Taking potassium supplements. ?Adjusting the medicines that you take. ?Eating more foods that contain a lot of potassium. ?If your potassium level is very low, you may need to get potassium through an IV and be monitored in the hospital. ?Follow these instructions at home: ?Eating and drinking ? ?Eat a healthy diet. A healthy diet includes fresh fruits and vegetables, whole grains, healthy fats, and lean proteins. ?If told, eat more foods that contain a lot of potassium. These include: ?Nuts, such as peanuts and pistachios. ?Seeds, such as sunflower seeds and pumpkin seeds. ?Peas, lentils, and lima beans. ?Whole grain and bran cereals and breads. ?Fresh fruits and vegetables, such as apricots, avocado, bananas, cantaloupe, kiwi, oranges, tomatoes, asparagus, and potatoes. ?Juices, such as orange, tomato, and prune. ?Lean meats, including fish. ?Milk and milk products, such as yogurt. ?General instructions ?Take over-the-counter and prescription medicines only as told by your health care provider. This includes vitamins, natural food products, and supplements. ?Keep all follow-up visits. This is important. ?Contact a health care provider if: ?You have weakness that gets worse. ?You feel your heart pounding or racing. ?You vomit. ?You have diarrhea. ?You have  diabetes and you have trouble keeping your blood sugar in your target range. ?Get help right away if: ?You have chest pain. ?You have shortness of breath. ?You have vomiting or diarrhea that lasts for more than 2 days. ?You faint. ?These symptoms may be an emergency. Get help right away. Call 911. ?Do not wait to see if the symptoms will go away. ?Do not drive yourself to the hospital. ?Summary ?Hypokalemia means that the amount of potassium in the blood is lower than normal. ?This condition is diagnosed with a blood test. ?Hypokalemia may be treated by taking potassium supplements, adjusting the medicines that you take, or eating more foods that are high in potassium. ?If your potassium level is very low, you may need to get potassium through an IV and be monitored in the hospital. ?This information is not intended to replace advice given to you by your health care provider. Make sure you discuss any questions you have with your health care provider. ?Document Revised: 06/16/2021 Document Reviewed: 06/16/2021 ?Elsevier Patient Education ? [place]. ? ? ?

## 2022-02-07 ENCOUNTER — Other Ambulatory Visit: Payer: Self-pay

## 2022-02-07 ENCOUNTER — Ambulatory Visit
Admission: RE | Admit: 2022-02-07 | Discharge: 2022-02-07 | Disposition: A | Discharge status: Home / Self Care | Payer: 59 | Source: Ambulatory Visit | Attending: Radiation Oncology | Admitting: Radiation Oncology

## 2022-02-07 DIAGNOSIS — Z5111 Encounter for antineoplastic chemotherapy: Secondary | ICD-10-CM | POA: Diagnosis not present

## 2022-02-07 LAB — RAD ONC ARIA SESSION SUMMARY
Course Elapsed Days: 22
Plan Fractions Treated to Date: 17
Plan Prescribed Dose Per Fraction: 2 Gy
Plan Total Fractions Prescribed: 30
Plan Total Prescribed Dose: 60 Gy
Reference Point Dosage Given to Date: 34 Gy
Reference Point Session Dosage Given: 2 Gy
Session Number: 17

## 2022-02-08 ENCOUNTER — Ambulatory Visit
Admission: RE | Admit: 2022-02-08 | Discharge: 2022-02-08 | Disposition: A | Discharge status: Home / Self Care | Payer: 59 | Source: Ambulatory Visit | Attending: Radiation Oncology | Admitting: Radiation Oncology

## 2022-02-08 ENCOUNTER — Other Ambulatory Visit: Payer: Self-pay

## 2022-02-08 DIAGNOSIS — Z5111 Encounter for antineoplastic chemotherapy: Secondary | ICD-10-CM | POA: Diagnosis not present

## 2022-02-08 LAB — RAD ONC ARIA SESSION SUMMARY
Course Elapsed Days: 23
Plan Fractions Treated to Date: 18
Plan Prescribed Dose Per Fraction: 2 Gy
Plan Total Fractions Prescribed: 30
Plan Total Prescribed Dose: 60 Gy
Reference Point Dosage Given to Date: 36 Gy
Reference Point Session Dosage Given: 2 Gy
Session Number: 18

## 2022-02-09 ENCOUNTER — Ambulatory Visit
Admission: RE | Admit: 2022-02-09 | Discharge: 2022-02-09 | Disposition: A | Discharge status: Home / Self Care | Payer: 59 | Source: Ambulatory Visit | Attending: Radiation Oncology | Admitting: Radiation Oncology

## 2022-02-09 ENCOUNTER — Other Ambulatory Visit: Payer: Self-pay

## 2022-02-09 DIAGNOSIS — Z5111 Encounter for antineoplastic chemotherapy: Secondary | ICD-10-CM | POA: Diagnosis not present

## 2022-02-09 LAB — RAD ONC ARIA SESSION SUMMARY
Course Elapsed Days: 24
Plan Fractions Treated to Date: 19
Plan Prescribed Dose Per Fraction: 2 Gy
Plan Total Fractions Prescribed: 30
Plan Total Prescribed Dose: 60 Gy
Reference Point Dosage Given to Date: 38 Gy
Reference Point Session Dosage Given: 2 Gy
Session Number: 19

## 2022-02-10 ENCOUNTER — Other Ambulatory Visit: Payer: Self-pay

## 2022-02-10 ENCOUNTER — Ambulatory Visit
Admission: RE | Admit: 2022-02-10 | Discharge: 2022-02-10 | Disposition: A | Discharge status: Home / Self Care | Payer: 59 | Source: Ambulatory Visit | Attending: Radiation Oncology | Admitting: Radiation Oncology

## 2022-02-10 DIAGNOSIS — Z5111 Encounter for antineoplastic chemotherapy: Secondary | ICD-10-CM | POA: Diagnosis not present

## 2022-02-10 LAB — RAD ONC ARIA SESSION SUMMARY
Course Elapsed Days: 25
Plan Fractions Treated to Date: 20
Plan Prescribed Dose Per Fraction: 2 Gy
Plan Total Fractions Prescribed: 30
Plan Total Prescribed Dose: 60 Gy
Reference Point Dosage Given to Date: 40 Gy
Reference Point Session Dosage Given: 2 Gy
Session Number: 20

## 2022-02-13 ENCOUNTER — Inpatient Hospital Stay: Payer: 59

## 2022-02-13 ENCOUNTER — Other Ambulatory Visit: Payer: Self-pay

## 2022-02-13 ENCOUNTER — Other Ambulatory Visit: Payer: Self-pay | Admitting: Physician Assistant

## 2022-02-13 ENCOUNTER — Ambulatory Visit
Admission: RE | Admit: 2022-02-13 | Discharge: 2022-02-13 | Disposition: A | Discharge status: Home / Self Care | Payer: 59 | Source: Ambulatory Visit | Attending: Radiation Oncology | Admitting: Radiation Oncology

## 2022-02-13 VITALS — BP 115/61 | HR 77 | Temp 97.7°F | Resp 16 | Wt 141.5 lb

## 2022-02-13 DIAGNOSIS — N390 Urinary tract infection, site not specified: Secondary | ICD-10-CM | POA: Diagnosis not present

## 2022-02-13 DIAGNOSIS — Z510 Encounter for antineoplastic radiation therapy: Secondary | ICD-10-CM | POA: Insufficient documentation

## 2022-02-13 DIAGNOSIS — C3431 Malignant neoplasm of lower lobe, right bronchus or lung: Secondary | ICD-10-CM | POA: Insufficient documentation

## 2022-02-13 DIAGNOSIS — C3491 Malignant neoplasm of unspecified part of right bronchus or lung: Secondary | ICD-10-CM

## 2022-02-13 DIAGNOSIS — Z5111 Encounter for antineoplastic chemotherapy: Secondary | ICD-10-CM | POA: Insufficient documentation

## 2022-02-13 DIAGNOSIS — R300 Dysuria: Secondary | ICD-10-CM | POA: Insufficient documentation

## 2022-02-13 DIAGNOSIS — N3000 Acute cystitis without hematuria: Secondary | ICD-10-CM

## 2022-02-13 DIAGNOSIS — R590 Localized enlarged lymph nodes: Secondary | ICD-10-CM | POA: Insufficient documentation

## 2022-02-13 LAB — URINALYSIS, COMPLETE (UACMP) WITH MICROSCOPIC
Bilirubin Urine: NEGATIVE
Glucose, UA: NEGATIVE mg/dL
Hgb urine dipstick: NEGATIVE
Ketones, ur: NEGATIVE mg/dL
Nitrite: POSITIVE — AB
Protein, ur: 30 mg/dL — AB
Specific Gravity, Urine: 1.016 (ref 1.005–1.030)
WBC, UA: >50 WBC/hpf — ABNORMAL HIGH (ref 0–5)
pH: 5 (ref 5.0–8.0)

## 2022-02-13 LAB — RAD ONC ARIA SESSION SUMMARY
Course Elapsed Days: 28
Plan Fractions Treated to Date: 21
Plan Prescribed Dose Per Fraction: 2 Gy
Plan Total Fractions Prescribed: 30
Plan Total Prescribed Dose: 60 Gy
Reference Point Dosage Given to Date: 42 Gy
Reference Point Session Dosage Given: 2 Gy
Session Number: 21

## 2022-02-13 LAB — CBC WITH DIFFERENTIAL (CANCER CENTER ONLY)
Abs Immature Granulocytes: 0.04 10*3/uL (ref 0.00–0.07)
Basophils Absolute: 0 10*3/uL (ref 0.0–0.1)
Basophils Relative: 1 %
Eosinophils Absolute: 0.1 10*3/uL (ref 0.0–0.5)
Eosinophils Relative: 4 %
HCT: 32.8 % — ABNORMAL LOW (ref 36.0–46.0)
Hemoglobin: 11.1 g/dL — ABNORMAL LOW (ref 12.0–15.0)
Immature Granulocytes: 1 %
Lymphocytes Relative: 18 %
Lymphs Abs: 0.6 10*3/uL — ABNORMAL LOW (ref 0.7–4.0)
MCH: 31.2 pg (ref 26.0–34.0)
MCHC: 33.8 g/dL (ref 30.0–36.0)
MCV: 92.1 fL (ref 80.0–100.0)
Monocytes Absolute: 0.3 10*3/uL (ref 0.1–1.0)
Monocytes Relative: 8 %
Neutro Abs: 2.2 10*3/uL (ref 1.7–7.7)
Neutrophils Relative %: 68 %
Platelet Count: 196 10*3/uL (ref 150–400)
RBC: 3.56 MIL/uL — ABNORMAL LOW (ref 3.87–5.11)
RDW: 12.9 % (ref 11.5–15.5)
WBC Count: 3.3 10*3/uL — ABNORMAL LOW (ref 4.0–10.5)
nRBC: 0 % (ref 0.0–0.2)

## 2022-02-13 LAB — CMP (CANCER CENTER ONLY)
ALT: 18 U/L (ref 0–44)
AST: 14 U/L — ABNORMAL LOW (ref 15–41)
Albumin: 3.6 g/dL (ref 3.5–5.0)
Alkaline Phosphatase: 68 U/L (ref 38–126)
Anion gap: 5 (ref 5–15)
BUN: 8 mg/dL (ref 8–23)
CO2: 29 mmol/L (ref 22–32)
Calcium: 9.2 mg/dL (ref 8.9–10.3)
Chloride: 103 mmol/L (ref 98–111)
Creatinine: 0.58 mg/dL (ref 0.44–1.00)
GFR, Estimated: >60 mL/min (ref >60)
Glucose, Bld: 111 mg/dL — ABNORMAL HIGH (ref 70–99)
Potassium: 3.6 mmol/L (ref 3.5–5.1)
Sodium: 137 mmol/L (ref 135–145)
Total Bilirubin: 0.2 mg/dL — ABNORMAL LOW (ref 0.3–1.2)
Total Protein: 7.3 g/dL (ref 6.5–8.1)

## 2022-02-13 MED ORDER — SODIUM CHLORIDE 0.9 % IV SOLN
45.0000 mg/m2 | Freq: Once | INTRAVENOUS | Status: AC
  Administered 2022-02-13: 78 mg via INTRAVENOUS
  Filled 2022-02-13: qty 13

## 2022-02-13 MED ORDER — SODIUM CHLORIDE 0.9 % IV SOLN
10.0000 mg | Freq: Once | INTRAVENOUS | Status: AC
  Administered 2022-02-13: 10 mg via INTRAVENOUS
  Filled 2022-02-13: qty 10

## 2022-02-13 MED ORDER — SODIUM CHLORIDE 0.9 % IV SOLN
200.0000 mg | Freq: Once | INTRAVENOUS | Status: AC
  Administered 2022-02-13: 200 mg via INTRAVENOUS
  Filled 2022-02-13: qty 20

## 2022-02-13 MED ORDER — FAMOTIDINE IN NACL 20-0.9 MG/50ML-% IV SOLN
20.0000 mg | Freq: Once | INTRAVENOUS | Status: AC
  Administered 2022-02-13: 20 mg via INTRAVENOUS
  Filled 2022-02-13: qty 50

## 2022-02-13 MED ORDER — PALONOSETRON HCL INJECTION 0.25 MG/5ML
0.2500 mg | Freq: Once | INTRAVENOUS | Status: AC
  Administered 2022-02-13: 0.25 mg via INTRAVENOUS
  Filled 2022-02-13: qty 5

## 2022-02-13 MED ORDER — DIPHENHYDRAMINE HCL 50 MG/ML IJ SOLN
50.0000 mg | Freq: Once | INTRAMUSCULAR | Status: AC
  Administered 2022-02-13: 50 mg via INTRAVENOUS
  Filled 2022-02-13: qty 1

## 2022-02-13 MED ORDER — NITROFURANTOIN MONOHYD MACRO 100 MG PO CAPS: 100.0000 mg | ORAL_CAPSULE | Freq: Two times a day (BID) | ORAL | 0 refills | Status: DC

## 2022-02-13 MED ORDER — SODIUM CHLORIDE 0.9 % IV SOLN: Freq: Once | INTRAVENOUS | Status: AC

## 2022-02-13 NOTE — Progress Notes (Signed)
Patient had some issues with her IVs today. Her veins seem fragile. Her first stick blew immediately upon stick. Her second IV did well with great blood return and her IV maintenance fluid was started with no issues. About half way through her taxol infusion she noticed it "leaking". Patient had gone to the bathroom and possibly caught on something? Patient noticed almost immediately. Infusion stopped and IV flushed -it flushed well and had blood return, but it was leaking. IV removed and little bit of leakage cleaned per protocol. New IV placed in opposite arm which had blood return and worked for [employer], then started leaking/felt funny per pt.- infusion stopped, IV removed and as IV removed blood formed under skin as if it "blew". Patient cleaned up (just a drop of carboplatin this time). Looked at by multiple nurses- no signs of infiltration. New IV placed and infusion completed. IV removed with tip intact without any issues at this time.  ?

## 2022-02-14 ENCOUNTER — Other Ambulatory Visit: Payer: Self-pay | Admitting: Physician Assistant

## 2022-02-14 ENCOUNTER — Ambulatory Visit
Admission: RE | Admit: 2022-02-14 | Discharge: 2022-02-14 | Disposition: A | Discharge status: Home / Self Care | Payer: 59 | Source: Ambulatory Visit | Attending: Radiation Oncology | Admitting: Radiation Oncology

## 2022-02-14 ENCOUNTER — Encounter: Payer: Self-pay | Admitting: Internal Medicine

## 2022-02-14 ENCOUNTER — Other Ambulatory Visit: Payer: Self-pay

## 2022-02-14 DIAGNOSIS — Z510 Encounter for antineoplastic radiation therapy: Secondary | ICD-10-CM | POA: Diagnosis not present

## 2022-02-14 DIAGNOSIS — N139 Obstructive and reflux uropathy, unspecified: Secondary | ICD-10-CM

## 2022-02-14 LAB — RAD ONC ARIA SESSION SUMMARY
Course Elapsed Days: 29
Plan Fractions Treated to Date: 22
Plan Prescribed Dose Per Fraction: 2 Gy
Plan Total Fractions Prescribed: 30
Plan Total Prescribed Dose: 60 Gy
Reference Point Dosage Given to Date: 44 Gy
Reference Point Session Dosage Given: 2 Gy
Session Number: 22

## 2022-02-14 NOTE — Progress Notes (Signed)
Patient came around today and stated that she wanted to be seen due to her cheek looking flushed after taking a  new antibiotic  yesterday. Patient denied any wheezing,sob or rash anywhere else on her body. Notified Cassandra Heilingoetter PA in med onc and she states that the patient had decadron yesterday and  that is what probably cause the redness. States that she could switch the patients antibiotic if she wanted her to since the patient thought that was what cause the redness of her cheeks . Informed the patient that the antibiotic could be changed ,but she states that she would continue to take it and if she developed any other issues she would reach reach out to cassandra or her nurse. ?

## 2022-02-15 ENCOUNTER — Other Ambulatory Visit: Payer: Self-pay

## 2022-02-15 ENCOUNTER — Ambulatory Visit
Admission: RE | Admit: 2022-02-15 | Discharge: 2022-02-15 | Disposition: A | Discharge status: Home / Self Care | Payer: 59 | Source: Ambulatory Visit | Attending: Radiation Oncology | Admitting: Radiation Oncology

## 2022-02-15 DIAGNOSIS — Z510 Encounter for antineoplastic radiation therapy: Secondary | ICD-10-CM | POA: Diagnosis not present

## 2022-02-15 LAB — URINE CULTURE: Culture: >=100000 — AB

## 2022-02-15 LAB — RAD ONC ARIA SESSION SUMMARY
Course Elapsed Days: 30
Plan Fractions Treated to Date: 23
Plan Prescribed Dose Per Fraction: 2 Gy
Plan Total Fractions Prescribed: 30
Plan Total Prescribed Dose: 60 Gy
Reference Point Dosage Given to Date: 46 Gy
Reference Point Session Dosage Given: 2 Gy
Session Number: 23

## 2022-02-16 ENCOUNTER — Other Ambulatory Visit: Payer: Self-pay

## 2022-02-16 ENCOUNTER — Ambulatory Visit
Admission: RE | Admit: 2022-02-16 | Discharge: 2022-02-16 | Disposition: A | Discharge status: Home / Self Care | Payer: 59 | Source: Ambulatory Visit | Attending: Radiation Oncology | Admitting: Radiation Oncology

## 2022-02-16 ENCOUNTER — Encounter: Payer: Self-pay | Admitting: Internal Medicine

## 2022-02-16 ENCOUNTER — Telehealth: Payer: Self-pay | Admitting: Internal Medicine

## 2022-02-16 DIAGNOSIS — Z510 Encounter for antineoplastic radiation therapy: Secondary | ICD-10-CM | POA: Diagnosis not present

## 2022-02-16 DIAGNOSIS — N3000 Acute cystitis without hematuria: Secondary | ICD-10-CM

## 2022-02-16 LAB — RAD ONC ARIA SESSION SUMMARY
Course Elapsed Days: 31
Plan Fractions Treated to Date: 24
Plan Prescribed Dose Per Fraction: 2 Gy
Plan Total Fractions Prescribed: 30
Plan Total Prescribed Dose: 60 Gy
Reference Point Dosage Given to Date: 48 Gy
Reference Point Session Dosage Given: 2 Gy
Session Number: 24

## 2022-02-16 MED ORDER — SULFAMETHOXAZOLE-TRIMETHOPRIM 800-160 MG PO TABS: 1.0000 | ORAL_TABLET | Freq: Two times a day (BID) | ORAL | 0 refills | Status: AC

## 2022-02-16 NOTE — Telephone Encounter (Signed)
Called patient regarding upcoming appointments, patient is notified. 

## 2022-02-16 NOTE — Telephone Encounter (Signed)
I spoke with the pt after discussing this with Dr. Julien Nordmann. He does not want pt to get a PAC as she is likely not to have to continue tx. ? ?I have relayed this information to the pt and she expressed understanding of this information. She also states she didn't realize it was a surgical procedure involved. Pt expressed she no longer wants a PAC and will be sure to better hydrate before her next treatment. ? ?Pt also states she is not feeling better now that she is almost done with her rx of Macrobid for UTI. Pt was advised a rx for Bactrim will be sent to her pharmacy and she is to take 1tb BID for 3 days. ?

## 2022-02-17 ENCOUNTER — Ambulatory Visit
Admission: RE | Admit: 2022-02-17 | Discharge: 2022-02-17 | Disposition: A | Discharge status: Home / Self Care | Payer: 59 | Source: Ambulatory Visit | Attending: Radiation Oncology | Admitting: Radiation Oncology

## 2022-02-17 ENCOUNTER — Other Ambulatory Visit: Payer: Self-pay | Admitting: Radiation Oncology

## 2022-02-17 ENCOUNTER — Other Ambulatory Visit: Payer: Self-pay

## 2022-02-17 DIAGNOSIS — Z510 Encounter for antineoplastic radiation therapy: Secondary | ICD-10-CM | POA: Diagnosis not present

## 2022-02-17 LAB — RAD ONC ARIA SESSION SUMMARY
Course Elapsed Days: 32
Plan Fractions Treated to Date: 25
Plan Prescribed Dose Per Fraction: 2 Gy
Plan Total Fractions Prescribed: 30
Plan Total Prescribed Dose: 60 Gy
Reference Point Dosage Given to Date: 50 Gy
Reference Point Session Dosage Given: 2 Gy
Session Number: 25

## 2022-02-17 MED ORDER — SUCRALFATE 1 G PO TABS: 1.0000 g | ORAL_TABLET | Freq: Four times a day (QID) | ORAL | 2 refills | Status: DC

## 2022-02-20 ENCOUNTER — Other Ambulatory Visit: Payer: Self-pay | Admitting: Medical Oncology

## 2022-02-20 ENCOUNTER — Ambulatory Visit
Admission: RE | Admit: 2022-02-20 | Discharge: 2022-02-20 | Disposition: A | Discharge status: Home / Self Care | Payer: 59 | Source: Ambulatory Visit | Attending: Radiation Oncology | Admitting: Radiation Oncology

## 2022-02-20 ENCOUNTER — Other Ambulatory Visit: Payer: Self-pay

## 2022-02-20 ENCOUNTER — Inpatient Hospital Stay: Payer: 59

## 2022-02-20 ENCOUNTER — Inpatient Hospital Stay (HOSPITAL_BASED_OUTPATIENT_CLINIC_OR_DEPARTMENT_OTHER): Payer: 59 | Admitting: Internal Medicine

## 2022-02-20 VITALS — BP 109/63 | HR 82 | Temp 97.9°F | Resp 18 | Wt 138.6 lb

## 2022-02-20 DIAGNOSIS — Z5111 Encounter for antineoplastic chemotherapy: Secondary | ICD-10-CM | POA: Diagnosis not present

## 2022-02-20 DIAGNOSIS — C3491 Malignant neoplasm of unspecified part of right bronchus or lung: Secondary | ICD-10-CM

## 2022-02-20 DIAGNOSIS — Z95828 Presence of other vascular implants and grafts: Secondary | ICD-10-CM

## 2022-02-20 DIAGNOSIS — R300 Dysuria: Secondary | ICD-10-CM

## 2022-02-20 DIAGNOSIS — Z510 Encounter for antineoplastic radiation therapy: Secondary | ICD-10-CM | POA: Diagnosis not present

## 2022-02-20 DIAGNOSIS — I878 Other specified disorders of veins: Secondary | ICD-10-CM

## 2022-02-20 DIAGNOSIS — C3490 Malignant neoplasm of unspecified part of unspecified bronchus or lung: Secondary | ICD-10-CM | POA: Diagnosis not present

## 2022-02-20 LAB — CBC WITH DIFFERENTIAL (CANCER CENTER ONLY)
Abs Immature Granulocytes: 0.02 10*3/uL (ref 0.00–0.07)
Basophils Absolute: 0 10*3/uL (ref 0.0–0.1)
Basophils Relative: 1 %
Eosinophils Absolute: 0.1 10*3/uL (ref 0.0–0.5)
Eosinophils Relative: 4 %
HCT: 33 % — ABNORMAL LOW (ref 36.0–46.0)
Hemoglobin: 11 g/dL — ABNORMAL LOW (ref 12.0–15.0)
Immature Granulocytes: 1 %
Lymphocytes Relative: 26 %
Lymphs Abs: 0.7 10*3/uL (ref 0.7–4.0)
MCH: 31.1 pg (ref 26.0–34.0)
MCHC: 33.3 g/dL (ref 30.0–36.0)
MCV: 93.2 fL (ref 80.0–100.0)
Monocytes Absolute: 0.3 10*3/uL (ref 0.1–1.0)
Monocytes Relative: 10 %
Neutro Abs: 1.6 10*3/uL — ABNORMAL LOW (ref 1.7–7.7)
Neutrophils Relative %: 58 %
Platelet Count: 176 10*3/uL (ref 150–400)
RBC: 3.54 MIL/uL — ABNORMAL LOW (ref 3.87–5.11)
RDW: 13.6 % (ref 11.5–15.5)
WBC Count: 2.7 10*3/uL — ABNORMAL LOW (ref 4.0–10.5)
nRBC: 0 % (ref 0.0–0.2)

## 2022-02-20 LAB — CMP (CANCER CENTER ONLY)
ALT: 18 U/L (ref 0–44)
AST: 19 U/L (ref 15–41)
Albumin: 3.7 g/dL (ref 3.5–5.0)
Alkaline Phosphatase: 71 U/L (ref 38–126)
Anion gap: 9 (ref 5–15)
BUN: 13 mg/dL (ref 8–23)
CO2: 24 mmol/L (ref 22–32)
Calcium: 9.2 mg/dL (ref 8.9–10.3)
Chloride: 103 mmol/L (ref 98–111)
Creatinine: 0.89 mg/dL (ref 0.44–1.00)
GFR, Estimated: >60 mL/min (ref >60)
Glucose, Bld: 141 mg/dL — ABNORMAL HIGH (ref 70–99)
Potassium: 3.7 mmol/L (ref 3.5–5.1)
Sodium: 136 mmol/L (ref 135–145)
Total Bilirubin: 0.3 mg/dL (ref 0.3–1.2)
Total Protein: 8 g/dL (ref 6.5–8.1)

## 2022-02-20 LAB — RAD ONC ARIA SESSION SUMMARY
Course Elapsed Days: 35
Plan Fractions Treated to Date: 26
Plan Prescribed Dose Per Fraction: 2 Gy
Plan Total Fractions Prescribed: 30
Plan Total Prescribed Dose: 60 Gy
Reference Point Dosage Given to Date: 52 Gy
Reference Point Session Dosage Given: 2 Gy
Session Number: 26

## 2022-02-20 LAB — URINALYSIS, COMPLETE (UACMP) WITH MICROSCOPIC
Bilirubin Urine: NEGATIVE
Glucose, UA: NEGATIVE mg/dL
Ketones, ur: NEGATIVE mg/dL
Leukocytes,Ua: NEGATIVE
Nitrite: POSITIVE — AB
Protein, ur: 30 mg/dL — AB
RBC / HPF: >50 RBC/hpf — ABNORMAL HIGH (ref 0–5)
Specific Gravity, Urine: 1.013 (ref 1.005–1.030)
pH: 6 (ref 5.0–8.0)

## 2022-02-20 MED ORDER — LIDOCAINE-PRILOCAINE 2.5-2.5 % EX CREA: 1.0000 "application " | TOPICAL_CREAM | CUTANEOUS | 0 refills | Status: DC | PRN

## 2022-02-20 MED ORDER — DIPHENHYDRAMINE HCL 50 MG/ML IJ SOLN
50.0000 mg | Freq: Once | INTRAMUSCULAR | Status: AC
  Administered 2022-02-20: 50 mg via INTRAVENOUS
  Filled 2022-02-20: qty 1

## 2022-02-20 MED ORDER — FAMOTIDINE IN NACL 20-0.9 MG/50ML-% IV SOLN
20.0000 mg | Freq: Once | INTRAVENOUS | Status: AC
  Administered 2022-02-20: 20 mg via INTRAVENOUS
  Filled 2022-02-20: qty 50

## 2022-02-20 MED ORDER — SODIUM CHLORIDE 0.9 % IV SOLN
180.0000 mg | Freq: Once | INTRAVENOUS | Status: AC
  Administered 2022-02-20: 180 mg via INTRAVENOUS
  Filled 2022-02-20: qty 18

## 2022-02-20 MED ORDER — SODIUM CHLORIDE 0.9 % IV SOLN
45.0000 mg/m2 | Freq: Once | INTRAVENOUS | Status: AC
  Administered 2022-02-20: 78 mg via INTRAVENOUS
  Filled 2022-02-20: qty 13

## 2022-02-20 MED ORDER — SODIUM CHLORIDE 0.9 % IV SOLN
10.0000 mg | Freq: Once | INTRAVENOUS | Status: AC
  Administered 2022-02-20: 10 mg via INTRAVENOUS
  Filled 2022-02-20: qty 10

## 2022-02-20 MED ORDER — SODIUM CHLORIDE 0.9 % IV SOLN: Freq: Once | INTRAVENOUS | Status: AC

## 2022-02-20 MED ORDER — PALONOSETRON HCL INJECTION 0.25 MG/5ML
0.2500 mg | Freq: Once | INTRAVENOUS | Status: AC
  Administered 2022-02-20: 0.25 mg via INTRAVENOUS
  Filled 2022-02-20: qty 5

## 2022-02-20 NOTE — Progress Notes (Signed)
?    [facility] ?[phone]   [phone] ? ?OFFICE PROGRESS NOTE ? ?Glenda Chroman, MD ?[address] ?[place] [address] ? ?DIAGNOSIS: Unresectable stage IIB (T2a, N1, M0) non-small cell lung cancer, squamous cell carcinoma presented with right lower lobe lung mass in addition to right hilar lymphadenopathy diagnosed in March 2023.   ? ?The patient had PD-L1 expression of 99%. ? ?PRIOR THERAPY: None ? ?CURRENT THERAPY: A course of concurrent chemoradiation with weekly carboplatin for AUC of 2 and paclitaxel 45 Mg/M2.  First dose started 01/16/2022.  Status post 5 cycles. ? ?INTERVAL HISTORY: ?Diane [name] 66 y.o. female returns to the clinic today for follow-up visit.  The patient is feeling fine today with no concerning complaints except for dysuria.  She had urinary tract infection and she was treated with Macrodantin for 3 days but no improvement in her symptoms.  She denied having any current chest pain, shortness of breath, cough or hemoptysis.  She has no nausea, vomiting, diarrhea or constipation.  She has no headache or visual changes.  She continues to tolerate her treatment with concurrent chemoradiation fairly well.  The patient is here today for evaluation before starting cycle #6 of her treatment. ? ?MEDICAL HISTORY: ?Past Medical History:  ?Diagnosis Date  ? Anxiety   ? Arthritis   ? fingers, left foot  ? C. difficile diarrhea 2015  ? COPD (chronic obstructive pulmonary disease) ([facility])   ? Depression   ? Eczema   ? H pylori ulcer   ? Headache(784.0)   ? Hypercholesterolemia   ? Hypertension   ? Kidney stones   ? 20 years ago  ? Pneumonia   ? 5 years ago  ? Seizures ([facility])   ? 2 years ago, "cluster of seizures" none since  ? Shingles   ? ? ?ALLERGIES:  is allergic to alpha-gal, other, penicillins, and prozac [fluoxetine]. ? ?MEDICATIONS:  ?Current Outpatient Medications  ?Medication Sig Dispense Refill  ? acitretin (SORIATANE) 25 MG capsule TAKE ONE CAPSULE BY  MOUTH DAILY (Patient taking differently: Take 25 mg by mouth every other day.) 30 capsule 3  ? albuterol (PROVENTIL) (2.5 MG/3ML) 0.083% nebulizer solution Take 3 mLs (2.5 mg total) by nebulization every 6 (six) hours as needed for wheezing or shortness of breath. (Patient not taking: Reported on 01/06/2022) 75 mL 12  ? albuterol (VENTOLIN HFA) 108 (90 Base) MCG/ACT inhaler Inhale 2 puffs into the lungs every 6 (six) hours as needed for wheezing or shortness of breath. (Patient not taking: Reported on 01/06/2022) 8 g 2  ? ALPRAZolam (XANAX) 0.5 MG tablet Take 0.5 mg by mouth daily as needed.    ? cetirizine (ZYRTEC) 10 MG tablet Take 10 mg by mouth at bedtime.    ? clobetasol ointment (TEMOVATE) 0.05 % APPLY TWICE DAILY (Patient taking differently: 1 application. daily as needed (Skin condition).) 60 g 0  ? EPINEPHrine (AUVI-Q) 0.3 mg/0.3 mL IJ SOAJ injection Inject 0.3 mLs (0.3 mg total) into the muscle as needed for anaphylaxis. 2 each 1  ? fluticasone (FLONASE) 50 MCG/ACT nasal spray Place 2 sprays into both nostrils daily. (Patient taking differently: Place 2 sprays into both nostrils daily as needed for allergies.) 16 g 5  ? hydrochlorothiazide (MICROZIDE) 12.5 MG capsule Take 12.5 mg by mouth daily.    ? nitrofurantoin, macrocrystal-monohydrate, (MACROBID) 100 MG capsule Take 1 capsule (100 mg total) by mouth 2 (two) times daily. 10 capsule 0  ? omeprazole (PRILOSEC) 20 MG  capsule Take 1 capsule (20 mg total) by mouth 2 (two) times daily. (Needs to be seen before next refill) 60 capsule 0  ? OVER THE COUNTER MEDICATION Take 1 capsule by mouth daily. Probiotic    ? prochlorperazine (COMPAZINE) 10 MG tablet Take 1 tablet (10 mg total) by mouth every 6 (six) hours as needed for nausea or vomiting. 30 tablet 0  ? rosuvastatin (CRESTOR) 10 MG tablet Take 10 mg by mouth at bedtime.    ? sucralfate (CARAFATE) 1 g tablet Take 1 tablet (1 g total) by mouth 4 (four) times daily. Dissolve each tablet in 15 cc water before  use. 120 tablet 2  ? Tapinarof (VTAMA) 1 % CREA Apply 1 application. topically daily. 60 g 3  ? Tiotropium Bromide-Olodaterol (STIOLTO RESPIMAT) 2.5-2.5 MCG/ACT AERS Inhale into the lungs.    ? Tiotropium Bromide-Olodaterol 2.5-2.5 MCG/ACT AERS Inhale 2 puffs into the lungs daily. 4 g 3  ? ?No current facility-administered medications for this visit.  ? ? ?SURGICAL HISTORY:  ?Past Surgical History:  ?Procedure Laterality Date  ? ABDOMINAL HYSTERECTOMY    ? ADENOIDECTOMY    ? APPENDECTOMY    ? BRONCHIAL BIOPSY  12/20/2021  ? Procedure: BRONCHIAL BIOPSIES;  Surgeon: Garner Nash, DO;  Location: [facility];  Service: Pulmonary;;  ? BRONCHIAL BRUSHINGS  12/20/2021  ? Procedure: BRONCHIAL BRUSHINGS;  Surgeon: Garner Nash, DO;  Location: [facility];  Service: Pulmonary;;  ? BRONCHIAL NEEDLE ASPIRATION BIOPSY  12/20/2021  ? Procedure: BRONCHIAL NEEDLE ASPIRATION BIOPSIES;  Surgeon: Garner Nash, DO;  Location: [facility];  Service: Pulmonary;;  ? CESAREAN SECTION    ? CHOLECYSTECTOMY    ? COLONOSCOPY  06/10/2012  ? RMR: Colonic polyps -removed as described above.   ? FINE NEEDLE ASPIRATION  12/20/2021  ? Procedure: FINE NEEDLE ASPIRATION (FNA) LINEAR;  Surgeon: Garner Nash, DO;  Location: [facility];  Service: Pulmonary;;  ? KNEE ARTHROSCOPY  1973  ? right knee,  torn cart  ? [facility] SURGERY  05/12/2015  ? L4 L5  ? LUMBAR LAMINECTOMY/DECOMPRESSION MICRODISCECTOMY Left 05/12/2015  ? Procedure: Left L4-5 Microdiscectomy;  Surgeon: Marybelle Killings, MD;  Location: [facility];  Service: Orthopedics;  Laterality: Left;  ? TONSILLECTOMY    ? VIDEO BRONCHOSCOPY WITH ENDOBRONCHIAL ULTRASOUND Bilateral 12/20/2021  ? Procedure: VIDEO BRONCHOSCOPY WITH ENDOBRONCHIAL ULTRASOUND;  Surgeon: Garner Nash, DO;  Location: [facility];  Service: Pulmonary;  Laterality: Bilateral;  ? VIDEO BRONCHOSCOPY WITH RADIAL ENDOBRONCHIAL ULTRASOUND  12/20/2021  ? Procedure: RADIAL ENDOBRONCHIAL ULTRASOUND;  Surgeon: Garner Nash, DO;   Location: [facility];  Service: Pulmonary;;  ? ? ?REVIEW OF SYSTEMS:  A comprehensive review of systems was negative except for: Genitourinary: positive for dysuria  ? ?PHYSICAL EXAMINATION: General appearance: alert, cooperative, fatigued, and no distress ?Head: Normocephalic, without obvious abnormality, atraumatic ?Neck: no adenopathy, no JVD, supple, symmetrical, trachea midline, and thyroid not enlarged, symmetric, no tenderness/mass/nodules ?Lymph nodes: Cervical, supraclavicular, and axillary nodes normal. ?Resp: clear to auscultation bilaterally ?Back: symmetric, no curvature. ROM normal. No CVA tenderness. ?Cardio: regular rate and rhythm, S1, S2 normal, no murmur, click, rub or gallop ?GI: soft, non-tender; bowel sounds normal; no masses,  no organomegaly ?Extremities: extremities normal, atraumatic, no cyanosis or edema ? ?ECOG PERFORMANCE STATUS: 1 - Symptomatic but completely ambulatory ? ?Blood pressure 109/63, pulse 82, temperature 97.9 ?F (36.6 ?C), temperature source Tympanic, resp. rate 18, weight 138 lb 9 oz (62.9 kg), SpO2 94 %. ? ?LABORATORY DATA: ?Lab  Results  ?Component Value Date  ? WBC 2.7 (L) 02/20/2022  ? HGB 11.0 (L) 02/20/2022  ? HCT 33.0 (L) 02/20/2022  ? MCV 93.2 02/20/2022  ? PLT 176 02/20/2022  ? ? ?  Chemistry   ?   ?Component Value Date/Time  ? NA 137 02/13/2022 0934  ? NA 139 12/03/2019 0928  ? K 3.6 02/13/2022 0934  ? CL 103 02/13/2022 0934  ? CO2 29 02/13/2022 0934  ? BUN 8 02/13/2022 0934  ? BUN 14 12/03/2019 0928  ? CREATININE 0.58 02/13/2022 0934  ? CREATININE 0.64 10/14/2021 0953  ?    ?Component Value Date/Time  ? CALCIUM 9.2 02/13/2022 0934  ? ALKPHOS 68 02/13/2022 0934  ? AST 14 (L) 02/13/2022 0934  ? ALT 18 02/13/2022 0934  ? BILITOT 0.2 (L) 02/13/2022 0934  ?  ? ? ? ?RADIOGRAPHIC STUDIES: ?No results found. ? ?ASSESSMENT AND PLAN: This is a very pleasant 66 years old white female diagnosed with unresectable stage IIb (T2 a, N1, M0) non-small cell lung cancer, squamous  cell carcinoma with right lower lobe lung mass in addition to right hilar lymphadenopathy in March 2023. ?The patient has PD-L1 expression of 99%. ?She is currently undergoing a course of concurrent chem

## 2022-02-20 NOTE — Patient Instructions (Signed)
[facility] MEDICAL ONCOLOGY   Discharge Instructions: Thank you for choosing [facility] to provide your oncology and hematology care.   If you have a lab appointment with the Cancer Center, please go directly to the Cancer Center and check in at the registration area.   Wear comfortable clothing and clothing appropriate for easy access to any Portacath or PICC line.   We strive to give you quality time with your provider. You may need to reschedule your appointment if you arrive late (15 or more minutes).  Arriving late affects you and other patients whose appointments are after yours.  Also, if you miss three or more appointments without notifying the office, you may be dismissed from the clinic at the provider's discretion.      For prescription refill requests, have your pharmacy contact our office and allow 72 hours for refills to be completed.    Today you received the following chemotherapy and/or immunotherapy agents: paclitaxel and carboplatin.      To help prevent nausea and vomiting after your treatment, we encourage you to take your nausea medication as directed.  BELOW ARE SYMPTOMS THAT SHOULD BE REPORTED IMMEDIATELY: *FEVER GREATER THAN 100.4 F (38 C) OR HIGHER *CHILLS OR SWEATING *NAUSEA AND VOMITING THAT IS NOT CONTROLLED WITH YOUR NAUSEA MEDICATION *UNUSUAL SHORTNESS OF BREATH *UNUSUAL BRUISING OR BLEEDING *URINARY PROBLEMS (pain or burning when urinating, or frequent urination) *BOWEL PROBLEMS (unusual diarrhea, constipation, pain near the anus) TENDERNESS IN MOUTH AND THROAT WITH OR WITHOUT PRESENCE OF ULCERS (sore throat, sores in mouth, or a toothache) UNUSUAL RASH, SWELLING OR PAIN  UNUSUAL VAGINAL DISCHARGE OR ITCHING   Items with * indicate a potential emergency and should be followed up as soon as possible or go to the Emergency Department if any problems should occur.  Please show the CHEMOTHERAPY ALERT CARD or IMMUNOTHERAPY ALERT  CARD at check-in to the Emergency Department and triage nurse.  Should you have questions after your visit or need to cancel or reschedule your appointment, please contact [facility]COL[facility]  Dept: [phone]  and follow the prompts.  Office hours are 8:00 a.m. to 4:30 p.m. Monday - Friday. Please note that voicemails left after 4:00 p.m. may not be returned until the following business day.  We are closed weekends and major holidays. You have access to a nurse at all times for urgent questions. Please call the main number to the clinic Dept: [phone] and follow the prompts.   For any non-urgent questions, you may also contact your provider using MyChart. We now offer e-Visits for anyone 18 and older to request care online for non-urgent symptoms. For details visit [email or web address].   Also download the MyChart app! Go to the app store, search "MyChart", open the app, select [facility], and log in with your MyChart username and password.  Due to Covid, a mask is required upon entering the hospital/clinic. If you do not have a mask, one will be given to you upon arrival. For doctor visits, patients may have 1 support person aged 18 or older with them. For treatment visits, patients cannot have anyone with them due to current Covid guidelines and our immunocompromised population.   

## 2022-02-20 NOTE — Progress Notes (Signed)
Ok to adj Carbo dose to 180 mg w/ today's CrCl per Dr. Julien Nordmann. ? ?Kennith Center, Pharm.D., CPP ?02/20/2022@9 :20 AM ? ? ?

## 2022-02-21 ENCOUNTER — Ambulatory Visit
Admission: RE | Admit: 2022-02-21 | Discharge: 2022-02-21 | Disposition: A | Discharge status: Home / Self Care | Payer: 59 | Source: Ambulatory Visit | Attending: Radiation Oncology | Admitting: Radiation Oncology

## 2022-02-21 ENCOUNTER — Other Ambulatory Visit: Payer: Self-pay

## 2022-02-21 DIAGNOSIS — Z510 Encounter for antineoplastic radiation therapy: Secondary | ICD-10-CM | POA: Diagnosis not present

## 2022-02-21 LAB — RAD ONC ARIA SESSION SUMMARY
Course Elapsed Days: 36
Plan Fractions Treated to Date: 27
Plan Prescribed Dose Per Fraction: 2 Gy
Plan Total Fractions Prescribed: 30
Plan Total Prescribed Dose: 60 Gy
Reference Point Dosage Given to Date: 54 Gy
Reference Point Session Dosage Given: 2 Gy
Session Number: 27

## 2022-02-22 ENCOUNTER — Other Ambulatory Visit: Payer: Self-pay

## 2022-02-22 ENCOUNTER — Telehealth: Payer: Self-pay | Admitting: Medical Oncology

## 2022-02-22 ENCOUNTER — Ambulatory Visit
Admission: RE | Admit: 2022-02-22 | Discharge: 2022-02-22 | Disposition: A | Discharge status: Home / Self Care | Payer: 59 | Source: Ambulatory Visit | Attending: Radiation Oncology | Admitting: Radiation Oncology

## 2022-02-22 DIAGNOSIS — Z510 Encounter for antineoplastic radiation therapy: Secondary | ICD-10-CM | POA: Diagnosis not present

## 2022-02-22 LAB — RAD ONC ARIA SESSION SUMMARY
Course Elapsed Days: 37
Plan Fractions Treated to Date: 28
Plan Prescribed Dose Per Fraction: 2 Gy
Plan Total Fractions Prescribed: 30
Plan Total Prescribed Dose: 60 Gy
Reference Point Dosage Given to Date: 56 Gy
Reference Point Session Dosage Given: 2 Gy
Session Number: 28

## 2022-02-22 NOTE — Telephone Encounter (Signed)
Progress notes were requested by Claiborne Billings for Jan -March DOS ? ?Faxed progress note from Dr Julien Nordmann for 03/23 . ?

## 2022-02-23 ENCOUNTER — Other Ambulatory Visit: Payer: Self-pay

## 2022-02-23 ENCOUNTER — Ambulatory Visit
Admission: RE | Admit: 2022-02-23 | Discharge: 2022-02-23 | Disposition: A | Discharge status: Home / Self Care | Payer: 59 | Source: Ambulatory Visit | Attending: Radiation Oncology | Admitting: Radiation Oncology

## 2022-02-23 DIAGNOSIS — C3491 Malignant neoplasm of unspecified part of right bronchus or lung: Secondary | ICD-10-CM

## 2022-02-23 DIAGNOSIS — Z510 Encounter for antineoplastic radiation therapy: Secondary | ICD-10-CM | POA: Diagnosis not present

## 2022-02-23 LAB — RAD ONC ARIA SESSION SUMMARY
Course Elapsed Days: 38
Plan Fractions Treated to Date: 29
Plan Prescribed Dose Per Fraction: 2 Gy
Plan Total Fractions Prescribed: 30
Plan Total Prescribed Dose: 60 Gy
Reference Point Dosage Given to Date: 58 Gy
Reference Point Session Dosage Given: 2 Gy
Session Number: 29

## 2022-02-24 ENCOUNTER — Ambulatory Visit
Admission: RE | Admit: 2022-02-24 | Discharge: 2022-02-24 | Disposition: A | Discharge status: Home / Self Care | Payer: 59 | Source: Ambulatory Visit | Attending: Radiation Oncology | Admitting: Radiation Oncology

## 2022-02-24 ENCOUNTER — Other Ambulatory Visit: Payer: Self-pay

## 2022-02-24 DIAGNOSIS — Z510 Encounter for antineoplastic radiation therapy: Secondary | ICD-10-CM | POA: Diagnosis not present

## 2022-02-24 LAB — RAD ONC ARIA SESSION SUMMARY
Course Elapsed Days: 39
Plan Fractions Treated to Date: 30
Plan Prescribed Dose Per Fraction: 2 Gy
Plan Total Fractions Prescribed: 30
Plan Total Prescribed Dose: 60 Gy
Reference Point Dosage Given to Date: 60 Gy
Reference Point Session Dosage Given: 2 Gy
Session Number: 30

## 2022-02-24 MED FILL — Dexamethasone Sodium Phosphate Inj 100 MG/10ML: INTRAMUSCULAR | Qty: 1 | Status: AC

## 2022-02-27 ENCOUNTER — Inpatient Hospital Stay: Payer: 59

## 2022-02-27 ENCOUNTER — Other Ambulatory Visit: Payer: Self-pay

## 2022-02-27 ENCOUNTER — Ambulatory Visit
Admission: RE | Admit: 2022-02-27 | Discharge: 2022-02-27 | Disposition: A | Discharge status: Home / Self Care | Payer: 59 | Source: Ambulatory Visit | Attending: Radiation Oncology | Admitting: Radiation Oncology

## 2022-02-27 VITALS — BP 104/66 | HR 86 | Temp 98.2°F | Resp 17 | Wt 143.5 lb

## 2022-02-27 DIAGNOSIS — C3491 Malignant neoplasm of unspecified part of right bronchus or lung: Secondary | ICD-10-CM

## 2022-02-27 DIAGNOSIS — Z510 Encounter for antineoplastic radiation therapy: Secondary | ICD-10-CM | POA: Diagnosis not present

## 2022-02-27 LAB — CMP (CANCER CENTER ONLY)
ALT: 18 U/L (ref 0–44)
AST: 21 U/L (ref 15–41)
Albumin: 4 g/dL (ref 3.5–5.0)
Alkaline Phosphatase: 71 U/L (ref 38–126)
Anion gap: 8 (ref 5–15)
BUN: 10 mg/dL (ref 8–23)
CO2: 26 mmol/L (ref 22–32)
Calcium: 9.6 mg/dL (ref 8.9–10.3)
Chloride: 98 mmol/L (ref 98–111)
Creatinine: 0.84 mg/dL (ref 0.44–1.00)
GFR, Estimated: >60 mL/min (ref >60)
Glucose, Bld: 102 mg/dL — ABNORMAL HIGH (ref 70–99)
Potassium: 4 mmol/L (ref 3.5–5.1)
Sodium: 132 mmol/L — ABNORMAL LOW (ref 135–145)
Total Bilirubin: 0.4 mg/dL (ref 0.3–1.2)
Total Protein: 8.6 g/dL — ABNORMAL HIGH (ref 6.5–8.1)

## 2022-02-27 LAB — CBC WITH DIFFERENTIAL (CANCER CENTER ONLY)
Abs Immature Granulocytes: 0.02 10*3/uL (ref 0.00–0.07)
Basophils Absolute: 0 10*3/uL (ref 0.0–0.1)
Basophils Relative: 1 %
Eosinophils Absolute: 0.1 10*3/uL (ref 0.0–0.5)
Eosinophils Relative: 4 %
HCT: 33.6 % — ABNORMAL LOW (ref 36.0–46.0)
Hemoglobin: 11.5 g/dL — ABNORMAL LOW (ref 12.0–15.0)
Immature Granulocytes: 1 %
Lymphocytes Relative: 21 %
Lymphs Abs: 0.5 10*3/uL — ABNORMAL LOW (ref 0.7–4.0)
MCH: 31.5 pg (ref 26.0–34.0)
MCHC: 34.2 g/dL (ref 30.0–36.0)
MCV: 92.1 fL (ref 80.0–100.0)
Monocytes Absolute: 0.4 10*3/uL (ref 0.1–1.0)
Monocytes Relative: 16 %
Neutro Abs: 1.2 10*3/uL — ABNORMAL LOW (ref 1.7–7.7)
Neutrophils Relative %: 57 %
Platelet Count: 242 10*3/uL (ref 150–400)
RBC: 3.65 MIL/uL — ABNORMAL LOW (ref 3.87–5.11)
RDW: 14.2 % (ref 11.5–15.5)
WBC Count: 2.2 10*3/uL — ABNORMAL LOW (ref 4.0–10.5)
nRBC: 0 % (ref 0.0–0.2)

## 2022-02-27 LAB — RAD ONC ARIA SESSION SUMMARY
Course Elapsed Days: 42
Plan Fractions Treated to Date: 1
Plan Prescribed Dose Per Fraction: 2 Gy
Plan Total Fractions Prescribed: 3
Plan Total Prescribed Dose: 6 Gy
Reference Point Dosage Given to Date: 62 Gy
Reference Point Session Dosage Given: 2 Gy
Session Number: 31

## 2022-02-27 MED ORDER — FAMOTIDINE IN NACL 20-0.9 MG/50ML-% IV SOLN
20.0000 mg | Freq: Once | INTRAVENOUS | Status: AC
  Administered 2022-02-27: 20 mg via INTRAVENOUS
  Filled 2022-02-27: qty 50

## 2022-02-27 MED ORDER — SODIUM CHLORIDE 0.9 % IV SOLN
10.0000 mg | Freq: Once | INTRAVENOUS | Status: AC
  Administered 2022-02-27: 10 mg via INTRAVENOUS
  Filled 2022-02-27: qty 10

## 2022-02-27 MED ORDER — DIPHENHYDRAMINE HCL 50 MG/ML IJ SOLN
50.0000 mg | Freq: Once | INTRAMUSCULAR | Status: AC
  Administered 2022-02-27: 50 mg via INTRAVENOUS
  Filled 2022-02-27: qty 1

## 2022-02-27 MED ORDER — PALONOSETRON HCL INJECTION 0.25 MG/5ML
0.2500 mg | Freq: Once | INTRAVENOUS | Status: AC
  Administered 2022-02-27: 0.25 mg via INTRAVENOUS
  Filled 2022-02-27: qty 5

## 2022-02-27 MED ORDER — SODIUM CHLORIDE 0.9 % IV SOLN
196.4000 mg | Freq: Once | INTRAVENOUS | Status: AC
  Administered 2022-02-27: 200 mg via INTRAVENOUS
  Filled 2022-02-27: qty 20

## 2022-02-27 MED ORDER — SODIUM CHLORIDE 0.9 % IV SOLN: Freq: Once | INTRAVENOUS | Status: AC

## 2022-02-27 MED ORDER — SODIUM CHLORIDE 0.9 % IV SOLN
45.0000 mg/m2 | Freq: Once | INTRAVENOUS | Status: AC
  Administered 2022-02-27: 78 mg via INTRAVENOUS
  Filled 2022-02-27: qty 13

## 2022-02-27 NOTE — Patient Instructions (Signed)
[facility]  Discharge Instructions: ?Thank you for choosing [facility] to provide your oncology and hematology care.  ? ?If you have a lab appointment with the [facility], please go directly to the [facility] and check in at the registration area. ?  ?Wear comfortable clothing and clothing appropriate for easy access to any Portacath or PICC line.  ? ?We strive to give you quality time with your provider. You may need to reschedule your appointment if you arrive late (15 or more minutes).  Arriving late affects you and other patients whose appointments are after yours.  Also, if you miss three or more appointments without notifying the office, you may be dismissed from the clinic at the provider?s discretion.    ?  ?For prescription refill requests, have your pharmacy contact our office and allow 72 hours for refills to be completed.   ? ?Today you received the following chemotherapy and/or immunotherapy agents: Taxol, Carboplatin    ?  ?To help prevent nausea and vomiting after your treatment, we encourage you to take your nausea medication as directed. ? ?BELOW ARE SYMPTOMS THAT SHOULD BE REPORTED IMMEDIATELY: ?*FEVER GREATER THAN 100.4 F (38 ?C) OR HIGHER ?*CHILLS OR SWEATING ?*NAUSEA AND VOMITING THAT IS NOT CONTROLLED WITH YOUR NAUSEA MEDICATION ?*UNUSUAL SHORTNESS OF BREATH ?*UNUSUAL BRUISING OR BLEEDING ?*URINARY PROBLEMS (pain or burning when urinating, or frequent urination) ?*BOWEL PROBLEMS (unusual diarrhea, constipation, pain near the anus) ?TENDERNESS IN MOUTH AND THROAT WITH OR WITHOUT PRESENCE OF ULCERS (sore throat, sores in mouth, or a toothache) ?UNUSUAL RASH, SWELLING OR PAIN  ?UNUSUAL VAGINAL DISCHARGE OR ITCHING  ? ?Items with * indicate a potential emergency and should be followed up as soon as possible or go to the Emergency Department if any problems should occur. ? ?Please show the CHEMOTHERAPY ALERT CARD or IMMUNOTHERAPY ALERT CARD at  check-in to the Emergency Department and triage nurse. ? ?Should you have questions after your visit or need to cancel or reschedule your appointment, please contact [facility]  Dept: [phone]  and follow the prompts.  Office hours are 8:00 a.m. to 4:30 p.m. Monday - Friday. Please note that voicemails left after 4:00 p.m. may not be returned until the following business day.  We are closed weekends and major holidays. You have access to a nurse at all times for urgent questions. Please call the main number to the clinic Dept: [phone] and follow the prompts. ? ? ?For any non-urgent questions, you may also contact your provider using MyChart. We now offer e-Visits for anyone 91 and older to request care online for non-urgent symptoms. For details visit [email or web address]. ?  ?Also download the MyChart app! Go to the app store, search "MyChart", open the app, select [facility], and log in with your MyChart username and password. ? ?Due to Covid, a mask is required upon entering the hospital/clinic. If you do not have a mask, one will be given to you upon arrival. For doctor visits, patients may have 1 support person aged 1 or older with them. For treatment visits, patients cannot have anyone with them due to current Covid guidelines and our immunocompromised population.  ? ?

## 2022-02-27 NOTE — Progress Notes (Signed)
02/27/22 Per Dr. Julien Nordmann, pt is okay to treat with Carboplatin/Taxol with an [place]bon of 1.2. ?

## 2022-02-28 ENCOUNTER — Ambulatory Visit: Payer: 59 | Admitting: Dermatology

## 2022-02-28 ENCOUNTER — Encounter: Payer: Self-pay | Admitting: Dermatology

## 2022-02-28 ENCOUNTER — Ambulatory Visit
Admission: RE | Admit: 2022-02-28 | Discharge: 2022-02-28 | Disposition: A | Discharge status: Home / Self Care | Payer: 59 | Source: Ambulatory Visit | Attending: Radiation Oncology | Admitting: Radiation Oncology

## 2022-02-28 ENCOUNTER — Other Ambulatory Visit: Payer: Self-pay

## 2022-02-28 DIAGNOSIS — Z510 Encounter for antineoplastic radiation therapy: Secondary | ICD-10-CM | POA: Diagnosis not present

## 2022-02-28 LAB — RAD ONC ARIA SESSION SUMMARY
Course Elapsed Days: 43
Plan Fractions Treated to Date: 2
Plan Prescribed Dose Per Fraction: 2 Gy
Plan Total Fractions Prescribed: 3
Plan Total Prescribed Dose: 6 Gy
Reference Point Dosage Given to Date: 64 Gy
Reference Point Session Dosage Given: 2 Gy
Session Number: 32

## 2022-03-01 ENCOUNTER — Other Ambulatory Visit: Payer: Self-pay

## 2022-03-01 ENCOUNTER — Ambulatory Visit
Admission: RE | Admit: 2022-03-01 | Discharge: 2022-03-01 | Disposition: A | Discharge status: Home / Self Care | Payer: 59 | Source: Ambulatory Visit | Attending: Radiation Oncology | Admitting: Radiation Oncology

## 2022-03-01 ENCOUNTER — Encounter: Payer: Self-pay | Admitting: Internal Medicine

## 2022-03-01 ENCOUNTER — Encounter: Payer: Self-pay | Admitting: Radiation Oncology

## 2022-03-01 DIAGNOSIS — Z510 Encounter for antineoplastic radiation therapy: Secondary | ICD-10-CM | POA: Diagnosis not present

## 2022-03-01 LAB — RAD ONC ARIA SESSION SUMMARY
Course Elapsed Days: 44
Plan Fractions Treated to Date: 3
Plan Prescribed Dose Per Fraction: 2 Gy
Plan Total Fractions Prescribed: 3
Plan Total Prescribed Dose: 6 Gy
Reference Point Dosage Given to Date: 66 Gy
Reference Point Session Dosage Given: 2 Gy
Session Number: 33

## 2022-03-02 ENCOUNTER — Ambulatory Visit: Payer: 59

## 2022-03-06 ENCOUNTER — Inpatient Hospital Stay: Payer: 59

## 2022-03-06 ENCOUNTER — Inpatient Hospital Stay: Payer: 59 | Admitting: Internal Medicine

## 2022-03-09 ENCOUNTER — Encounter: Payer: Self-pay | Admitting: Internal Medicine

## 2022-03-09 NOTE — Progress Notes (Signed)
                                                                                                                                                             Patient Name: Diane [name] MRN: [id number] DOB: 1956/01/24 Referring Physician: June Leap Date of Service: 03/01/2022 [facility], [place]                                                        End Of Treatment Note  Diagnoses: C34.31-Malignant neoplasm of lower lobe, right bronchus or lung  Cancer Staging:  Stage IIB, cT2aN1M0, NSCLC , Squamous Cell Carcinoma of the RLL  Intent: Curative  Radiation Treatment Dates: 01/16/2022 through 03/01/2022 Site Technique Total Dose (Gy) Dose per Fx (Gy) Completed Fx Beam Energies  Lung, Right: Lung_R 3D 60/60 2 30/30 6X  Lung, Right: Lung_R_Bst 3D 6/6 2 3/3 6X   Narrative: The patient tolerated radiation therapy relatively well. She developed fatigue and anticipated skin changes in the treatment field. She did not complain of esophagitis but did have some discomfort while managing a UTI during therapy.  Plan: The patient will receive a call in about one month from the radiation oncology department. She will continue follow up with Dr. Julien Nordmann as well.   ________________________________________________    Carola Rhine, [facility]

## 2022-03-10 ENCOUNTER — Ambulatory Visit (INDEPENDENT_AMBULATORY_CARE_PROVIDER_SITE_OTHER): Payer: 59 | Admitting: Urology

## 2022-03-10 ENCOUNTER — Encounter: Payer: Self-pay | Admitting: Urology

## 2022-03-10 VITALS — BP 122/72 | HR 90

## 2022-03-10 DIAGNOSIS — N390 Urinary tract infection, site not specified: Secondary | ICD-10-CM | POA: Diagnosis not present

## 2022-03-10 LAB — URINALYSIS, ROUTINE W REFLEX MICROSCOPIC
Bilirubin, UA: NEGATIVE
Glucose, UA: NEGATIVE
Ketones, UA: NEGATIVE
Nitrite, UA: NEGATIVE
Specific Gravity, UA: >1.03 — ABNORMAL HIGH (ref 1.005–1.030)
Urobilinogen, Ur: 0.2 mg/dL (ref 0.2–1.0)
pH, UA: 5.5 (ref 5.0–7.5)

## 2022-03-10 LAB — MICROSCOPIC EXAMINATION: Epithelial Cells (non renal): >10 /hpf — AB (ref 0–10)

## 2022-03-10 LAB — BLADDER SCAN AMB NON-IMAGING: Scan Result: 16

## 2022-03-10 MED ORDER — DOXYCYCLINE HYCLATE 100 MG PO CAPS: 100.0000 mg | ORAL_CAPSULE | Freq: Two times a day (BID) | ORAL | 0 refills | Status: DC

## 2022-03-10 NOTE — Progress Notes (Signed)
03/10/2022 9:18 AM   Diane [name] 1956-08-28 [id number]  Referring provider: Glenda Chroman, MD [address] [place],  [place] [address]  Recurrent UTI   HPI: Diane [name] is a 65yo here for evaluation of recurrent UTI. She has been on 3 different antibiotics in the past month for a UTI. Urine culture grew pan sensitive E Coli. She is currently on cipro. UA today is concerning for infection. She continues to have dysuria. She had gross hematuria. She is currently on treatment for non-small cell lung cancer. She has a 50 pk year smoking hx.  She previously took macrobid and bactrim which did not improve her dysuria.   PMH: Past Medical History:  Diagnosis Date   Anxiety    Arthritis    fingers, left foot   C. difficile diarrhea 2015   COPD (chronic obstructive pulmonary disease) (HCC)    Depression    Eczema    H pylori ulcer    Headache(784.0)    Hypercholesterolemia    Hypertension    Kidney stones    20 years ago   Pneumonia    5 years ago   Seizures ([facility])    2 years ago, "cluster of seizures" none since   Shingles     Surgical History: Past Surgical History:  Procedure Laterality Date   ABDOMINAL HYSTERECTOMY     ADENOIDECTOMY     APPENDECTOMY     BRONCHIAL BIOPSY  12/20/2021   Procedure: BRONCHIAL BIOPSIES;  Surgeon: Garner Nash, DO;  Location: [facility];  Service: Pulmonary;;   BRONCHIAL BRUSHINGS  12/20/2021   Procedure: BRONCHIAL BRUSHINGS;  Surgeon: Garner Nash, DO;  Location: [facility];  Service: Pulmonary;;   BRONCHIAL NEEDLE ASPIRATION BIOPSY  12/20/2021   Procedure: BRONCHIAL NEEDLE ASPIRATION BIOPSIES;  Surgeon: Garner Nash, DO;  Location: [facility];  Service: Pulmonary;;   CESAREAN SECTION     CHOLECYSTECTOMY     COLONOSCOPY  06/10/2012   RMR: Colonic polyps -removed as described above.    FINE NEEDLE ASPIRATION  12/20/2021   Procedure: FINE NEEDLE ASPIRATION (FNA) LINEAR;  Surgeon: Garner Nash, DO;  Location: [facility];  Service: Pulmonary;;   KNEE ARTHROSCOPY  1973   right knee,  torn cart   LUMBAR DISC SURGERY  05/12/2015   L4 L5   LUMBAR LAMINECTOMY/DECOMPRESSION MICRODISCECTOMY Left 05/12/2015   Procedure: Left L4-5 Microdiscectomy;  Surgeon: Marybelle Killings, MD;  Location: [facility];  Service: Orthopedics;  Laterality: Left;   TONSILLECTOMY     VIDEO BRONCHOSCOPY WITH ENDOBRONCHIAL ULTRASOUND Bilateral 12/20/2021   Procedure: VIDEO BRONCHOSCOPY WITH ENDOBRONCHIAL ULTRASOUND;  Surgeon: Garner Nash, DO;  Location: [facility];  Service: Pulmonary;  Laterality: Bilateral;   VIDEO BRONCHOSCOPY WITH RADIAL ENDOBRONCHIAL ULTRASOUND  12/20/2021   Procedure: RADIAL ENDOBRONCHIAL ULTRASOUND;  Surgeon: Garner Nash, DO;  Location: [facility];  Service: Pulmonary;;    Home Medications:  Allergies as of 03/10/2022       Reactions   Alpha-gal    Other reaction(s): Abdominal Pain   Other Other (See Comments)   Penicillins    Unknown type of reaction   Prozac [fluoxetine] Rash        Medication List        Accurate as of Mar 10, 2022  9:18 AM. If you have any questions, ask your nurse or doctor.          acitretin 25 MG capsule Commonly known as: SORIATANE TAKE ONE CAPSULE BY MOUTH DAILY What  changed: when to take this   albuterol 108 (90 Base) MCG/ACT inhaler Commonly known as: VENTOLIN HFA Inhale 2 puffs into the lungs every 6 (six) hours as needed for wheezing or shortness of breath.   albuterol (2.5 MG/3ML) 0.083% nebulizer solution Commonly known as: PROVENTIL Take 3 mLs (2.5 mg total) by nebulization every 6 (six) hours as needed for wheezing or shortness of breath.   ALPRAZolam 0.5 MG tablet Commonly known as: XANAX Take 0.5 mg by mouth daily as needed.   cetirizine 10 MG tablet Commonly known as: ZYRTEC Take 10 mg by mouth at bedtime.   clobetasol ointment 0.05 % Commonly known as: TEMOVATE APPLY TWICE DAILY What changed:  how much to take how to take  this when to take this reasons to take this   EPINEPHrine 0.3 mg/0.3 mL Soaj injection Commonly known as: Auvi-Q Inject 0.3 mLs (0.3 mg total) into the muscle as needed for anaphylaxis.   fluticasone 50 MCG/ACT nasal spray Commonly known as: FLONASE Place 2 sprays into both nostrils daily. What changed:  when to take this reasons to take this   hydrochlorothiazide 12.5 MG capsule Commonly known as: MICROZIDE Take 12.5 mg by mouth daily.   lidocaine-prilocaine cream Commonly known as: EMLA Apply 1 application. topically as needed (apply 1-2 hours prior to chemotherapy . Apply to skin over port and cover with plastic wrap.).   nitrofurantoin (macrocrystal-monohydrate) 100 MG capsule Commonly known as: Macrobid Take 1 capsule (100 mg total) by mouth 2 (two) times daily.   omeprazole 20 MG capsule Commonly known as: PRILOSEC Take 1 capsule (20 mg total) by mouth 2 (two) times daily. (Needs to be seen before next refill)   OVER THE COUNTER MEDICATION Take 1 capsule by mouth daily. Probiotic   prochlorperazine 10 MG tablet Commonly known as: COMPAZINE Take 1 tablet (10 mg total) by mouth every 6 (six) hours as needed for nausea or vomiting.   rosuvastatin 10 MG tablet Commonly known as: CRESTOR Take 10 mg by mouth at bedtime.   Stiolto Respimat 2.5-2.5 MCG/ACT Aers Generic drug: Tiotropium Bromide-Olodaterol Inhale into the lungs.   Tiotropium Bromide-Olodaterol 2.5-2.5 MCG/ACT Aers Inhale 2 puffs into the lungs daily.   sucralfate 1 g tablet Commonly known as: Carafate Take 1 tablet (1 g total) by mouth 4 (four) times daily. Dissolve each tablet in 15 cc water before use.   Vtama 1 % Crea Generic drug: Tapinarof Apply 1 application. topically daily.        Allergies:  Allergies  Allergen Reactions   Alpha-Gal     Other reaction(s): Abdominal Pain   Other Other (See Comments)   Penicillins     Unknown type of reaction   Prozac [Fluoxetine] Rash     Family History: Family History  Problem Relation Age of Onset   Arthritis Mother    Asthma Mother    Depression Mother    Hyperlipidemia Mother    Varicose Veins Mother    Arthritis Father    Heart disease Father    Hyperlipidemia Father    Vision loss Father    Cancer Sister    COPD Sister    Early death Sister    Cancer Brother    Alcohol abuse Maternal Grandfather    Colon cancer Neg Hx     Social History:  reports that she quit smoking about 4 years ago. Her smoking use included cigarettes. She has a 15.00 pack-year smoking history. She has never used smokeless tobacco. She reports current  alcohol use of about 21.0 standard drinks per week. She reports that she does not use drugs.  ROS: All other review of systems were reviewed and are negative except what is noted above in HPI  Physical Exam: BP 122/72   Pulse 90   Constitutional:  Alert and oriented, No acute distress. HEENT: [place] AT, moist mucus membranes.  Trachea midline, no masses. Cardiovascular: No clubbing, cyanosis, or edema. Respiratory: Normal respiratory effort, no increased work of breathing. GI: Abdomen is soft, nontender, nondistended, no abdominal masses GU: No CVA tenderness.  Lymph: No cervical or inguinal lymphadenopathy. Skin: No rashes, bruises or suspicious lesions. Neurologic: Grossly intact, no focal deficits, moving all 4 extremities. Psychiatric: Normal mood and affect.  Laboratory Data: Lab Results  Component Value Date   WBC 2.2 (L) 02/27/2022   HGB 11.5 (L) 02/27/2022   HCT 33.6 (L) 02/27/2022   MCV 92.1 02/27/2022   PLT 242 02/27/2022    Lab Results  Component Value Date   CREATININE 0.84 02/27/2022    No results found for: PSA  No results found for: TESTOSTERONE  Lab Results  Component Value Date   HGBA1C 5.1 01/11/2018    Urinalysis    Component Value Date/Time   COLORURINE AMBER (A) 02/20/2022 0846   APPEARANCEUR CLEAR 02/20/2022 0846   LABSPEC 1.013  02/20/2022 0846   PHURINE 6.0 02/20/2022 0846   GLUCOSEU NEGATIVE 02/20/2022 0846   HGBUR MODERATE (A) 02/20/2022 0846   BILIRUBINUR NEGATIVE 02/20/2022 [place] 02/20/2022 0846   PROTEINUR 30 (A) 02/20/2022 0846   UROBILINOGEN 0.2 10/06/2012 2112   NITRITE POSITIVE (A) 02/20/2022 0846   LEUKOCYTESUR NEGATIVE 02/20/2022 0846    Lab Results  Component Value Date   BACTERIA RARE (A) 02/20/2022    Pertinent Imaging: PET 11/2021: Images reviewed and discussed with the patient No results found for this or any previous visit.  No results found for this or any previous visit.  No results found for this or any previous visit.  No results found for this or any previous visit.  No results found for this or any previous visit.  No results found for this or any previous visit.  No results found for this or any previous visit.  No results found for this or any previous visit.   Assessment & Plan:    1. Recurrent UTI -Urine for culture -Doxycycline 100mg  BID for 7 days - Urinalysis, Routine w reflex microscopic - BLADDER SCAN AMB NON-IMAGING   No follow-ups on file.  Nicolette Bang, MD  [facility] [place]

## 2022-03-10 NOTE — Progress Notes (Signed)
post void residual=16

## 2022-03-10 NOTE — Patient Instructions (Signed)
Urinary Tract Infection, Adult A urinary tract infection (UTI) is an infection of any part of the urinary tract. The urinary tract includes: The kidneys. The ureters. The bladder. The urethra. These organs make, store, and get rid of pee (urine) in the body. What are the causes? This infection is caused by germs (bacteria) in your genital area. These germs grow and cause swelling (inflammation) of your urinary tract. What increases the risk? The following factors may make you more likely to develop this condition: Using a small, thin tube (catheter) to drain pee. Not being able to control when you pee or poop (incontinence). Being female. If you are female, these things can increase the risk: Using these methods to prevent pregnancy: A medicine that kills sperm (spermicide). A device that blocks sperm (diaphragm). Having low levels of a female hormone (estrogen). Being pregnant. You are more likely to develop this condition if: You have genes that add to your risk. You are sexually active. You take antibiotic medicines. You have trouble peeing because of: A prostate that is bigger than normal, if you are female. A blockage in the part of your body that drains pee from the bladder. A kidney stone. A nerve condition that affects your bladder. Not getting enough to drink. Not peeing often enough. You have other conditions, such as: Diabetes. A weak disease-fighting system (immune system). Sickle cell disease. Gout. Injury of the spine. What are the signs or symptoms? Symptoms of this condition include: Needing to pee right away. Peeing small amounts often. Pain or burning when peeing. Blood in the pee. Pee that smells bad or not like normal. Trouble peeing. Pee that is cloudy. Fluid coming from the vagina, if you are female. Pain in the belly or lower back. Other symptoms include: Vomiting. Not feeling hungry. Feeling mixed up (confused). This may be the first symptom in  older adults. Being tired and grouchy (irritable). A fever. Watery poop (diarrhea). How is this treated? Taking antibiotic medicine. Taking other medicines. Drinking enough water. In some cases, you may need to see a specialist. Follow these instructions at home:  Medicines Take over-the-counter and prescription medicines only as told by your doctor. If you were prescribed an antibiotic medicine, take it as told by your doctor. Do not stop taking it even if you start to feel better. General instructions Make sure you: Pee until your bladder is empty. Do not hold pee for a long time. Empty your bladder after sex. Wipe from front to back after peeing or pooping if you are a female. Use each tissue one time when you wipe. Drink enough fluid to keep your pee pale yellow. Keep all follow-up visits. Contact a doctor if: You do not get better after 1-2 days. Your symptoms go away and then come back. Get help right away if: You have very bad back pain. You have very bad pain in your lower belly. You have a fever. You have chills. You feeling like you will vomit or you vomit. Summary A urinary tract infection (UTI) is an infection of any part of the urinary tract. This condition is caused by germs in your genital area. There are many risk factors for a UTI. Treatment includes antibiotic medicines. Drink enough fluid to keep your pee pale yellow. This information is not intended to replace advice given to you by your health care provider. Make sure you discuss any questions you have with your health care provider. Document Revised: 05/14/2020 Document Reviewed: 05/14/2020 Elsevier Patient Education    2023 [employer]  

## 2022-03-12 LAB — URINE CULTURE: Organism ID, Bacteria: NO GROWTH

## 2022-03-14 ENCOUNTER — Ambulatory Visit: Payer: 59

## 2022-03-14 ENCOUNTER — Other Ambulatory Visit: Payer: 59

## 2022-03-16 ENCOUNTER — Ambulatory Visit (HOSPITAL_COMMUNITY)
Admission: RE | Admit: 2022-03-16 | Discharge: 2022-03-16 | Disposition: A | Discharge status: Home / Self Care | Payer: 59 | Source: Ambulatory Visit | Attending: Internal Medicine | Admitting: Internal Medicine

## 2022-03-16 DIAGNOSIS — C3490 Malignant neoplasm of unspecified part of unspecified bronchus or lung: Secondary | ICD-10-CM | POA: Diagnosis present

## 2022-03-16 MED ORDER — IOHEXOL 300 MG/ML  SOLN
75.0000 mL | Freq: Once | INTRAMUSCULAR | Status: AC | PRN
  Administered 2022-03-16: 75 mL via INTRAVENOUS

## 2022-03-16 MED ORDER — SODIUM CHLORIDE (PF) 0.9 % IJ SOLN
INTRAMUSCULAR | Status: AC
  Filled 2022-03-16: qty 50

## 2022-03-18 NOTE — Progress Notes (Signed)
[facility] OFFICE PROGRESS NOTE  Glenda Chroman, MD [place] [address]  DIAGNOSIS:  Unresectable stage IIB (T2a, N1, M0) non-small cell lung cancer, squamous cell carcinoma presented with right lower lobe lung mass in addition to right hilar lymphadenopathy diagnosed in March 2023.     The patient had PD-L1 expression of 99%.  PRIOR THERAPY: A course of concurrent chemoradiation with weekly carboplatin for AUC of 2 and paclitaxel 45 Mg/M2.  Last dose on 02/27/22  Status post 7 cycles.  CURRENT THERAPY: Observation  INTERVAL HISTORY: Diane [name] 66 y.o. female returns to the clinic today for a follow-up visit accompanied by her husband.  Last month, the patient completed her course of concurrent chemoradiation.  Her last dose of chemotherapy was on 02/27/2022.  She tolerated this fairly well without any concerning complaints except for nausea and vomiting 2 days after treatment as well as mild fatigue.  In the interval since last being seen, the patient has been following with urology due to recurrent dysuria despite taking 4 different antibiotics.  They are planning on performing a bladder scan. Otherwise, she feels well today except for fatigue. She denies any recent fever, chills, night sweats, or unexplained weight loss.  She denies developing any significant dysphagia or odynophagia with concurrent chemoradiation.  She denies usual diarrhea. Denies any headache or visual changes.  Denies any fever, chills, night sweats, or unexplained weight loss.  Denies any chest pain, shortness of breath, or hemoptysis.  She sometimes may have a cough on and off. She states some days are worse than others. She recently had a restaging CT scan performed.  She is here today for evaluation and to review her scan results and to discuss the next steps.    MEDICAL HISTORY: Past Medical History:  Diagnosis Date   Anxiety    Arthritis    fingers, left foot   C. difficile  diarrhea 2015   COPD (chronic obstructive pulmonary disease) (HCC)    Depression    Eczema    H pylori ulcer    Headache(784.0)    Hypercholesterolemia    Hypertension    Kidney stones    20 years ago   Pneumonia    5 years ago   Seizures ([facility] [place])    2 years ago, "cluster of seizures" none since   Shingles     ALLERGIES:  is allergic to alpha-gal, other, penicillins, and prozac [fluoxetine].  MEDICATIONS:  Current Outpatient Medications  Medication Sig Dispense Refill   ALPRAZolam (XANAX) 0.5 MG tablet Take 0.5 mg by mouth daily as needed.     cetirizine (ZYRTEC) 10 MG tablet Take 10 mg by mouth at bedtime.     EPINEPHrine (AUVI-Q) 0.3 mg/0.3 mL IJ SOAJ injection Inject 0.3 mLs (0.3 mg total) into the muscle as needed for anaphylaxis. 2 each 1   fluticasone (FLONASE) 50 MCG/ACT nasal spray Place 2 sprays into both nostrils daily. (Patient taking differently: Place 2 sprays into both nostrils daily as needed for allergies.) 16 g 5   hydrochlorothiazide (MICROZIDE) 12.5 MG capsule Take 12.5 mg by mouth daily.     omeprazole (PRILOSEC) 20 MG capsule Take 1 capsule (20 mg total) by mouth 2 (two) times daily. (Needs to be seen before next refill) 60 capsule 0   OVER THE COUNTER MEDICATION Take 1 capsule by mouth daily. Probiotic     rosuvastatin (CRESTOR) 10 MG tablet Take 10 mg by mouth at bedtime.     No  current facility-administered medications for this visit.    SURGICAL HISTORY:  Past Surgical History:  Procedure Laterality Date   ABDOMINAL HYSTERECTOMY     ADENOIDECTOMY     APPENDECTOMY     BRONCHIAL BIOPSY  12/20/2021   Procedure: BRONCHIAL BIOPSIES;  Surgeon: Garner Nash, DO;  Location: [facility];  Service: Pulmonary;;   BRONCHIAL BRUSHINGS  12/20/2021   Procedure: BRONCHIAL BRUSHINGS;  Surgeon: Garner Nash, DO;  Location: [facility];  Service: Pulmonary;;   BRONCHIAL NEEDLE ASPIRATION BIOPSY  12/20/2021   Procedure: BRONCHIAL NEEDLE ASPIRATION BIOPSIES;   Surgeon: Garner Nash, DO;  Location: [facility];  Service: Pulmonary;;   CESAREAN SECTION     CHOLECYSTECTOMY     COLONOSCOPY  06/10/2012   RMR: Colonic polyps -removed as described above.    FINE NEEDLE ASPIRATION  12/20/2021   Procedure: FINE NEEDLE ASPIRATION (FNA) LINEAR;  Surgeon: Garner Nash, DO;  Location: [facility];  Service: Pulmonary;;   KNEE ARTHROSCOPY  1973   right knee,  torn cart   LUMBAR DISC SURGERY  05/12/2015   L4 L5   LUMBAR LAMINECTOMY/DECOMPRESSION MICRODISCECTOMY Left 05/12/2015   Procedure: Left L4-5 Microdiscectomy;  Surgeon: Marybelle Killings, MD;  Location: [facility];  Service: Orthopedics;  Laterality: Left;   TONSILLECTOMY     VIDEO BRONCHOSCOPY WITH ENDOBRONCHIAL ULTRASOUND Bilateral 12/20/2021   Procedure: VIDEO BRONCHOSCOPY WITH ENDOBRONCHIAL ULTRASOUND;  Surgeon: Garner Nash, DO;  Location: [facility];  Service: Pulmonary;  Laterality: Bilateral;   VIDEO BRONCHOSCOPY WITH RADIAL ENDOBRONCHIAL ULTRASOUND  12/20/2021   Procedure: RADIAL ENDOBRONCHIAL ULTRASOUND;  Surgeon: Garner Nash, DO;  Location: [facility];  Service: Pulmonary;;    REVIEW OF SYSTEMS:   Review of Systems  Constitutional: Positive for fatigue.  Negative for appetite change, chills, fatigue, fever and unexpected weight change.  HENT: Negative for mouth sores, nosebleeds, sore throat and trouble swallowing.   Eyes: Negative for eye problems and icterus.  Respiratory: Positive for on and off intermittent cough. Negative for hemoptysis, shortness of breath and wheezing.   Cardiovascular: Negative for chest pain and leg swelling.  Gastrointestinal: Negative for abdominal pain, constipation, diarrhea, nausea and vomiting.  Genitourinary: Negative for bladder incontinence, difficulty urinating, dysuria, frequency and hematuria.   Musculoskeletal: Negative for back pain, gait problem, neck pain and neck stiffness.  Skin: Negative for itching and rash.  Neurological: Negative for  dizziness, extremity weakness, gait problem, headaches, light-headedness and seizures.  Hematological: Negative for adenopathy. Does not bruise/bleed easily.  Psychiatric/Behavioral: Negative for confusion, depression and sleep disturbance. The patient is not nervous/anxious.     PHYSICAL EXAMINATION:  Blood pressure 138/72, pulse 96, temperature 97.6 F (36.4 C), temperature source Tympanic, resp. rate 18, height _0  (1.676 m), weight 139 lb 9.6 oz (63.3 kg), SpO2 94 %.  ECOG PERFORMANCE STATUS: 1  Physical Exam  Constitutional: Oriented to person, place, and time and well-developed, well-nourished, and in no distress.  HENT:  Head: Normocephalic and atraumatic.  Mouth/Throat: Oropharynx is clear and moist. No oropharyngeal exudate.  Eyes: Conjunctivae are normal. Right eye exhibits no discharge. Left eye exhibits no discharge. No scleral icterus.  Neck: Normal range of motion. Neck supple.  Cardiovascular: Normal rate, regular rhythm, normal heart sounds and intact distal pulses.   Pulmonary/Chest: Effort normal and breath sounds normal. No respiratory distress. No wheezes. No rales.  Abdominal: Soft. Bowel sounds are normal. Exhibits no distension and no mass. There is no tenderness.  Musculoskeletal: Normal range of motion. Exhibits  no edema.  Lymphadenopathy:    No cervical adenopathy.  Neurological: Alert and oriented to person, place, and time. Exhibits normal muscle tone. Gait normal. Coordination normal.  Skin: Skin is warm and dry. No rash noted. Not diaphoretic. No erythema. No pallor.  Psychiatric: Mood, memory and judgment normal.  Vitals reviewed.  LABORATORY DATA: Lab Results  Component Value Date   WBC 5.5 03/23/2022   HGB 12.0 03/23/2022   HCT 35.3 (L) 03/23/2022   MCV 94.4 03/23/2022   PLT 271 03/23/2022      Chemistry      Component Value Date/Time   NA 137 03/23/2022 1011   NA 139 12/03/2019 0928   K 4.1 03/23/2022 1011   CL 100 03/23/2022 1011    CO2 29 03/23/2022 1011   BUN 10 03/23/2022 1011   BUN 14 12/03/2019 0928   CREATININE 0.61 03/23/2022 1011   CREATININE 0.64 10/14/2021 0953      Component Value Date/Time   CALCIUM 9.8 03/23/2022 1011   ALKPHOS 75 03/23/2022 1011   AST 23 03/23/2022 1011   ALT 13 03/23/2022 1011   BILITOT 0.4 03/23/2022 1011       RADIOGRAPHIC STUDIES:  CT Chest W Contrast  Result Date: 03/17/2022 CLINICAL DATA:  Non-small cell lung cancer staging in a 66 year old female. Insert body on EXAM: CT CHEST WITH CONTRAST TECHNIQUE: Multidetector CT imaging of the chest was performed during intravenous contrast administration. RADIATION DOSE REDUCTION: This exam was performed according to the departmental dose-optimization program which includes automated exposure control, adjustment of the mA and/or kV according to patient size and/or use of iterative reconstruction technique. CONTRAST:  33m OMNIPAQUE IOHEXOL 300 MG/ML  SOLN COMPARISON:  CT of December 13, 2021. FINDINGS: Cardiovascular: Heart size normal without pericardial effusion or nodularity. Signs of calcified coronary artery disease as before. Calcified and noncalcified aortic atherosclerotic plaque without aneurysmal dilation. Normal caliber of central pulmonary vessels. Mediastinum/Nodes: Scattered small lymph nodes throughout the chest, none displaying pathologic enlargement. RIGHT hilar adenopathy seen on previous imaging now measuring 12 mm suspected size compared to prior studies which did not have contrast, perhaps as large as 16 mm previously. Subcentimeter RIGHT paratracheal, LEFT paratracheal and subcarinal lymph nodes. No frank adenopathy in the LEFT hilum. Thoracic inlet structures are unremarkable. No axillary adenopathy. No internal mammary adenopathy. Lungs/Pleura: Marked interval decrease in the soft tissue associated with the mass in the RIGHT lower lobe now mainly cystic and measuring 2.6 x 1.9 cm. This previously measured 3.4 x 3.0 cm (image  65/5) Paraseptal emphysematous changes moderate to marked and similar to previous imaging. Areas of septal thickening also unchanged. No new or suspicious pulmonary nodule. Airways are patent. Some areas of bronchiectasis and septal thickening raising the question of chronic interstitial lung disease. Upper Abdomen: Heterogeneous appearance of the spleen favored to represent early splenic enhancement. No acute findings relative to liver, pancreas, adrenal glands, kidneys, stomach and gastrointestinal tract as visualized. No upper abdominal lymphadenopathy. Musculoskeletal: No acute bone finding. No destructive bone process. Spinal degenerative changes. IMPRESSION: 1. Marked interval decrease in the soft tissue associated with the RIGHT lower lobe mass, now mainly cystic and measuring 2.6 x 1.9 cm, previously 3.4 x 3.0 cm. Only a thin rim of soft tissue is seen at the periphery. 2. Decreased size of RIGHT hilar lymph nodes now 12 mm with subcentimeter mediastinal lymph nodes slightly diminished and remaining less than a cm. 3. No new or suspicious pulmonary nodule. 4. Aortic atherosclerosis, pulmonary emphysema and signs  of coronary artery disease as before. 5. Background of interstitial lung disease is suspected, attention on follow-up. Aortic Atherosclerosis (ICD10-I70.0) and Emphysema (ICD10-J43.9). Electronically Signed   By: Zetta Bills M.D.   On: 03/17/2022 08:35     ASSESSMENT/PLAN:  This is a very pleasant 66 year old Caucasian female diagnosed with unresectable stage IIb (T2 a, N1, M0) non-small cell lung cancer, squamous cell carcinoma.  The patient initially presented with a right lower lobe lung mass in addition to right hilar lymphadenopathy.  She was diagnosed in March 2023.  Her PD-L1 expression is 99%.  The patient completed a course of concurrent chemoradiation with carboplatin for an AUC of 2 and paclitaxel 45 mg per metered squared.  She status post 7 cycles.  She tolerated this well.  Her  last day of chemotherapy was on 02/27/2022.  The patient recently had a restaging CT scan performed.  Dr. Julien Nordmann personally and independently reviewed the scan discussed results with the patient today.  Since she is stage IIB, she is not a candidate for immunotherapy since she is stage II disease and it is approved for consolidating in stage III lung cancer. Therefore, Dr. Julien Nordmann recommends that she continue on observation with a restaging CT scan in 3 months.   I will place the order. We will see her back for a follow up visit at that time to review her scan results.   She will continue to follow closely with urology regarding her dysuria.  Regarding her mild intermittent cough, no evidence of infection on imaging. Advised she can use OTC cough medication if needed.   The patient was advised to call immediately if she has any concerning symptoms in the interval. The patient voices understanding of current disease status and treatment options and is in agreement with the current care plan. All questions were answered. The patient knows to call the clinic with any problems, questions or concerns. We can certainly see the patient much sooner if necessary   Belina Mandile L Kevan Prouty, PA-C 03/27/22  ADDENDUM: Hematology/Oncology Attending: I had a face-to-face encounter with the patient today.  I reviewed her records, lab, scan and recommended her care plan.  This is a very pleasant 66 years old white female diagnosed with unresectable stage IIb (T2 a, N1, M0) non-small cell lung cancer, squamous cell carcinoma presented with right lower lobe lung mass in addition to right hilar lymphadenopathy diagnosed in March 2023 with PD-L1 expression of 99%. The patient underwent a course of concurrent chemoradiation with weekly carboplatin and paclitaxel for 7 cycles. She tolerated her treatment well except for fatigue. The patient had repeat CT scan of the chest performed recently.  I personally and  independently reviewed the scan images and discussed the result with the patient and her husband. Her scan showed improvement of her disease with decrease in the size of the right lower lobe lung mass in addition to right hilar adenopathy. The patient is not a candidate for consolidation immunotherapy because she has a stage IIb and not III. I recommended for the patient to continue on observation and close monitoring with repeat CT scan of the chest in around 3 months from now. She was advised to call immediately if she has any other concerning symptoms in the interval. The total time spent in the appointment was 30 minutes. Disclaimer: This note was dictated with voice recognition software. Similar sounding words can inadvertently be transcribed and may be missed upon review. Eilleen Kempf, MD

## 2022-03-20 ENCOUNTER — Ambulatory Visit: Payer: 59

## 2022-03-20 ENCOUNTER — Other Ambulatory Visit: Payer: 59

## 2022-03-20 ENCOUNTER — Ambulatory Visit: Payer: 59 | Admitting: Physician Assistant

## 2022-03-23 ENCOUNTER — Inpatient Hospital Stay: Payer: 59 | Attending: Internal Medicine

## 2022-03-23 ENCOUNTER — Other Ambulatory Visit: Payer: Self-pay

## 2022-03-23 DIAGNOSIS — C3431 Malignant neoplasm of lower lobe, right bronchus or lung: Secondary | ICD-10-CM | POA: Insufficient documentation

## 2022-03-23 DIAGNOSIS — C3490 Malignant neoplasm of unspecified part of unspecified bronchus or lung: Secondary | ICD-10-CM

## 2022-03-23 DIAGNOSIS — Z9221 Personal history of antineoplastic chemotherapy: Secondary | ICD-10-CM | POA: Insufficient documentation

## 2022-03-23 LAB — CMP (CANCER CENTER ONLY)
ALT: 13 U/L (ref 0–44)
AST: 23 U/L (ref 15–41)
Albumin: 4.1 g/dL (ref 3.5–5.0)
Alkaline Phosphatase: 75 U/L (ref 38–126)
Anion gap: 8 (ref 5–15)
BUN: 10 mg/dL (ref 8–23)
CO2: 29 mmol/L (ref 22–32)
Calcium: 9.8 mg/dL (ref 8.9–10.3)
Chloride: 100 mmol/L (ref 98–111)
Creatinine: 0.61 mg/dL (ref 0.44–1.00)
GFR, Estimated: >60 mL/min (ref >60)
Glucose, Bld: 97 mg/dL (ref 70–99)
Potassium: 4.1 mmol/L (ref 3.5–5.1)
Sodium: 137 mmol/L (ref 135–145)
Total Bilirubin: 0.4 mg/dL (ref 0.3–1.2)
Total Protein: 8.7 g/dL — ABNORMAL HIGH (ref 6.5–8.1)

## 2022-03-23 LAB — CBC WITH DIFFERENTIAL (CANCER CENTER ONLY)
Abs Immature Granulocytes: 0.05 10*3/uL (ref 0.00–0.07)
Basophils Absolute: 0 10*3/uL (ref 0.0–0.1)
Basophils Relative: 1 %
Eosinophils Absolute: 0.1 10*3/uL (ref 0.0–0.5)
Eosinophils Relative: 2 %
HCT: 35.3 % — ABNORMAL LOW (ref 36.0–46.0)
Hemoglobin: 12 g/dL (ref 12.0–15.0)
Immature Granulocytes: 1 %
Lymphocytes Relative: 18 %
Lymphs Abs: 1 10*3/uL (ref 0.7–4.0)
MCH: 32.1 pg (ref 26.0–34.0)
MCHC: 34 g/dL (ref 30.0–36.0)
MCV: 94.4 fL (ref 80.0–100.0)
Monocytes Absolute: 0.6 10*3/uL (ref 0.1–1.0)
Monocytes Relative: 10 %
Neutro Abs: 3.8 10*3/uL (ref 1.7–7.7)
Neutrophils Relative %: 68 %
Platelet Count: 271 10*3/uL (ref 150–400)
RBC: 3.74 MIL/uL — ABNORMAL LOW (ref 3.87–5.11)
RDW: 14.9 % (ref 11.5–15.5)
WBC Count: 5.5 10*3/uL (ref 4.0–10.5)
nRBC: 0 % (ref 0.0–0.2)

## 2022-03-27 ENCOUNTER — Inpatient Hospital Stay (HOSPITAL_BASED_OUTPATIENT_CLINIC_OR_DEPARTMENT_OTHER): Payer: 59 | Admitting: Physician Assistant

## 2022-03-27 ENCOUNTER — Ambulatory Visit: Payer: 59

## 2022-03-27 ENCOUNTER — Other Ambulatory Visit: Payer: 59

## 2022-03-27 ENCOUNTER — Other Ambulatory Visit: Payer: Self-pay

## 2022-03-27 VITALS — BP 138/72 | HR 96 | Temp 97.6°F | Resp 18 | Ht 66.0 in | Wt 139.6 lb

## 2022-03-27 DIAGNOSIS — C3491 Malignant neoplasm of unspecified part of right bronchus or lung: Secondary | ICD-10-CM | POA: Diagnosis not present

## 2022-03-27 DIAGNOSIS — C3431 Malignant neoplasm of lower lobe, right bronchus or lung: Secondary | ICD-10-CM | POA: Diagnosis not present

## 2022-03-27 DIAGNOSIS — Z7189 Other specified counseling: Secondary | ICD-10-CM

## 2022-04-03 ENCOUNTER — Other Ambulatory Visit: Payer: 59

## 2022-04-03 ENCOUNTER — Ambulatory Visit: Payer: 59 | Admitting: Internal Medicine

## 2022-04-03 ENCOUNTER — Ambulatory Visit: Payer: 59

## 2022-04-03 ENCOUNTER — Encounter: Payer: Self-pay | Admitting: *Deleted

## 2022-04-03 NOTE — Progress Notes (Signed)
I followed up on Diane [name]'s plan of care. She is set up at this time for follow appts.

## 2022-04-09 ENCOUNTER — Encounter: Payer: Self-pay | Admitting: Gastroenterology

## 2022-04-10 ENCOUNTER — Ambulatory Visit
Admission: RE | Admit: 2022-04-10 | Discharge: 2022-04-10 | Disposition: A | Discharge status: Home / Self Care | Payer: 59 | Source: Ambulatory Visit | Attending: Radiation Oncology | Admitting: Radiation Oncology

## 2022-04-10 DIAGNOSIS — C3491 Malignant neoplasm of unspecified part of right bronchus or lung: Secondary | ICD-10-CM | POA: Insufficient documentation

## 2022-04-12 ENCOUNTER — Other Ambulatory Visit: Payer: 59 | Admitting: Urology

## 2022-04-14 ENCOUNTER — Encounter: Payer: Self-pay | Admitting: Acute Care

## 2022-04-14 ENCOUNTER — Ambulatory Visit (INDEPENDENT_AMBULATORY_CARE_PROVIDER_SITE_OTHER): Payer: 59 | Admitting: Acute Care

## 2022-04-14 ENCOUNTER — Other Ambulatory Visit: Payer: Self-pay | Admitting: Physician Assistant

## 2022-04-14 VITALS — BP 140/80 | HR 90 | Temp 98.2°F | Ht 65.0 in | Wt 140.0 lb

## 2022-04-14 DIAGNOSIS — C3490 Malignant neoplasm of unspecified part of unspecified bronchus or lung: Secondary | ICD-10-CM | POA: Diagnosis not present

## 2022-04-14 DIAGNOSIS — Z87891 Personal history of nicotine dependence: Secondary | ICD-10-CM

## 2022-04-14 NOTE — Patient Instructions (Addendum)
It is great to see you today. I am so glad you have done so well with your treatment.  Your PFT's do not show COPD. You do have some emphysema from smoking.  We will follow this as needed.  Follow up CT Chest 06/27/2022 as is scheduled. Follow up in 4 months after next follow up CT Chest to re-evaluate ? ILD findings.  Let us know if you need [place] sooner.  Please contact office for sooner follow up if symptoms do not improve or worsen or seek emergency care

## 2022-04-14 NOTE — Progress Notes (Signed)
History of Present Illness Diane [name] is a 66 y.o. female former smoker ( Quit 10/09/2021 with a 30 pack year smoking history) diagnosed with unresectable stage IIb (T2 a, N1, M0)  right lower lobe non-small cell lung cancer, squamous cell carcinoma with right hilar lymphadenopathy in  12/2021. She has undergone concurrent chemoradiation as treatment. She completed this 02/2022. She is not a candidate for immunotherapy, so she is currently being observed with 3 month follow up restaging scan due 06/2022. She is followed by Dr. Valeta Harms.   Treatment :  Concurrent chemoradiation with carboplatin for an AUC of 2 and paclitaxel 45 mg per metered squared.  She status post 7 cycles. Last dose chemo 02/27/2022. Last radiation 03/01/2022  Current Plan after 03/2022 restaging CT Chest >> Observation with 3 month follow up CT Chest 06/2022  04/14/2022 Pt. Presents for follow up. She completed her chemotherapy 02/27/2022. She had a restaging CT done 03/17/2022 , results are below. She does not qualify for immunotherapy as she had stage IIB disease, and this is approved for stage III lung cancer. We have reviewed her PFT's.We discussed that they did not show COPD. We discussed that her DLCO is low, and that this could be related to her emphysema or another scarring type condition.  She states she has no breathing issues. She has not used her inhalers as she feels they do not help with her breathing. ( PFT's showed minimal BD response)  She is doing great. She has had a cough lately most likely due to the current environmental factors. There was notation of a background of interstitial lung disease on her most recent staging scan. We will review her 3 month re-staging scan and evaluate if she needs to be referred to Dr. Chase Caller or [facility] for ILD management.   She is going to [facility] next week to celebrate the end of her cancer treatments. She looks great!!   Test Results: Restaging CT Chest 03/16/2022 Marked  interval decrease in the soft tissue associated with the RIGHT lower lobe mass, now mainly cystic and measuring 2.6 x 1.9 cm, previously 3.4 x 3.0 cm. Only a thin rim of soft tissue is seen at the periphery. 2. Decreased size of RIGHT hilar lymph nodes now 12 mm with subcentimeter mediastinal lymph nodes slightly diminished and remaining less than a cm. 3. No new or suspicious pulmonary nodule. 4. Aortic atherosclerosis, pulmonary emphysema and signs of coronary artery disease as before. 5. Background of interstitial lung disease is suspected, attention on follow-up.  PFT 01/04/2022                  Latest Ref Rng & Units 03/23/2022   10:11 AM 02/27/2022    9:12 AM 02/20/2022    7:59 AM  CBC  WBC 4.0 - 10.5 K/uL 5.5  2.2  2.7   Hemoglobin 12.0 - 15.0 g/dL 12.0  11.5  11.0   Hematocrit 36.0 - 46.0 % 35.3  33.6  33.0   Platelets 150 - 400 K/uL 271  242  176        Latest Ref Rng & Units 03/23/2022   10:11 AM 02/27/2022    9:12 AM 02/20/2022    7:59 AM  BMP  Glucose 70 - 99 mg/dL 97  102  141   BUN 8 - 23 mg/dL 10  10  13    Creatinine 0.44 - 1.00 mg/dL 0.61  0.84  0.89   Sodium 135 - 145 mmol/L 137  132  136   Potassium 3.5 - 5.1 mmol/L 4.1  4.0  3.7   Chloride 98 - 111 mmol/L 100  98  103   CO2 22 - 32 mmol/L 29  26  24    Calcium 8.9 - 10.3 mg/dL 9.8  9.6  9.2     BNP No results found for: "BNP"  ProBNP No results found for: "PROBNP"  PFT    Component Value Date/Time   FEV1PRE 1.84 01/04/2022 0951   FEV1POST 1.91 01/04/2022 0951   FVCPRE 2.54 01/04/2022 0951   FVCPOST 2.69 01/04/2022 0951   TLC 3.94 01/04/2022 0951   DLCOUNC 9.15 01/04/2022 0951   PREFEV1FVCRT 73 01/04/2022 0951   PSTFEV1FVCRT 71 01/04/2022 0951    CT Chest W Contrast  Result Date: 03/17/2022 CLINICAL DATA:  Non-small cell lung cancer staging in a 66 year old female. Insert body on EXAM: CT CHEST WITH CONTRAST TECHNIQUE: Multidetector CT imaging of the chest was performed during  intravenous contrast administration. RADIATION DOSE REDUCTION: This exam was performed according to the departmental dose-optimization program which includes automated exposure control, adjustment of the mA and/or kV according to patient size and/or use of iterative reconstruction technique. CONTRAST:  35mL OMNIPAQUE IOHEXOL 300 MG/ML  SOLN COMPARISON:  CT of December 13, 2021. FINDINGS: Cardiovascular: Heart size normal without pericardial effusion or nodularity. Signs of calcified coronary artery disease as before. Calcified and noncalcified aortic atherosclerotic plaque without aneurysmal dilation. Normal caliber of central pulmonary vessels. Mediastinum/Nodes: Scattered small lymph nodes throughout the chest, none displaying pathologic enlargement. RIGHT hilar adenopathy seen on previous imaging now measuring 12 mm suspected size compared to prior studies which did not have contrast, perhaps as large as 16 mm previously. Subcentimeter RIGHT paratracheal, LEFT paratracheal and subcarinal lymph nodes. No frank adenopathy in the LEFT hilum. Thoracic inlet structures are unremarkable. No axillary adenopathy. No internal mammary adenopathy. Lungs/Pleura: Marked interval decrease in the soft tissue associated with the mass in the RIGHT lower lobe now mainly cystic and measuring 2.6 x 1.9 cm. This previously measured 3.4 x 3.0 cm (image 65/5) Paraseptal emphysematous changes moderate to marked and similar to previous imaging. Areas of septal thickening also unchanged. No new or suspicious pulmonary nodule. Airways are patent. Some areas of bronchiectasis and septal thickening raising the question of chronic interstitial lung disease. Upper Abdomen: Heterogeneous appearance of the spleen favored to represent early splenic enhancement. No acute findings relative to liver, pancreas, adrenal glands, kidneys, stomach and gastrointestinal tract as visualized. No upper abdominal lymphadenopathy. Musculoskeletal: No acute bone  finding. No destructive bone process. Spinal degenerative changes. IMPRESSION: 1. Marked interval decrease in the soft tissue associated with the RIGHT lower lobe mass, now mainly cystic and measuring 2.6 x 1.9 cm, previously 3.4 x 3.0 cm. Only a thin rim of soft tissue is seen at the periphery. 2. Decreased size of RIGHT hilar lymph nodes now 12 mm with subcentimeter mediastinal lymph nodes slightly diminished and remaining less than a cm. 3. No new or suspicious pulmonary nodule. 4. Aortic atherosclerosis, pulmonary emphysema and signs of coronary artery disease as before. 5. Background of interstitial lung disease is suspected, attention on follow-up. Aortic Atherosclerosis (ICD10-I70.0) and Emphysema (ICD10-J43.9). Electronically Signed   By: Zetta Bills M.D.   On: 03/17/2022 08:35     Past medical hx Past Medical History:  Diagnosis Date   Anxiety    Arthritis    fingers, left foot   C. difficile diarrhea 2015   COPD (chronic obstructive pulmonary disease) ([place])  Depression    Eczema    H pylori ulcer    Headache(784.0)    Hypercholesterolemia    Hypertension    Kidney stones    20 years ago   Pneumonia    5 years ago   Seizures ([place])    2 years ago, "cluster of seizures" none since   Shingles      Social History   Tobacco Use   Smoking status: Former    Packs/day: 0.50    Years: 30.00    Total pack years: 15.00    Types: Cigarettes    Quit date: 10/09/2021    Years since quitting: 0.5   Smokeless tobacco: Never   Tobacco comments:    5-10 cigarettes a day (plans to quit prior to this surgery 05/06/15)  Vaping Use   Vaping Use: Some days  Substance Use Topics   Alcohol use: Yes    Alcohol/week: 21.0 standard drinks of alcohol    Types: 21 Shots of liquor per week    Comment: 3 per day   Drug use: No    Ms.Rodda reports that she quit smoking about 6 months ago. Her smoking use included cigarettes. She has a 15.00 pack-year smoking history. She has never  used smokeless tobacco. She reports current alcohol use of about 21.0 standard drinks of alcohol per week. She reports that she does not use drugs.  Tobacco Cessation: Former smoker, last cigarette 10/09/2021  Past surgical hx, Family hx, Social hx all reviewed.  Current Outpatient Medications on File Prior to Visit  Medication Sig   ALPRAZolam (XANAX) 0.5 MG tablet Take 0.5 mg by mouth daily as needed.   cetirizine (ZYRTEC) 10 MG tablet Take 10 mg by mouth at bedtime.   EPINEPHrine (AUVI-Q) 0.3 mg/0.3 mL IJ SOAJ injection Inject 0.3 mLs (0.3 mg total) into the muscle as needed for anaphylaxis.   fluticasone (FLONASE) 50 MCG/ACT nasal spray Place 2 sprays into both nostrils daily. (Patient taking differently: Place 2 sprays into both nostrils daily as needed for allergies.)   hydrochlorothiazide (MICROZIDE) 12.5 MG capsule Take 12.5 mg by mouth daily.   omeprazole (PRILOSEC) 20 MG capsule Take 1 capsule (20 mg total) by mouth 2 (two) times daily. (Needs to be seen before next refill)   OVER THE COUNTER MEDICATION Take 1 capsule by mouth daily. Probiotic   rosuvastatin (CRESTOR) 10 MG tablet Take 10 mg by mouth at bedtime.   No current facility-administered medications on file prior to visit.     Allergies  Allergen Reactions   Alpha-Gal     Other reaction(s): Abdominal Pain   Other Other (See Comments)   Penicillins     Unknown type of reaction   Prozac [Fluoxetine] Rash    Review Of Systems:  Constitutional:   No  weight loss, night sweats,  Fevers, chills,  +fatigue, or  lassitude.  HEENT:   No headaches,  Difficulty swallowing,  Tooth/dental problems, or  Sore throat,                No sneezing, itching, ear ache, nasal congestion, post nasal drip,   CV:  No chest pain,  Orthopnea, PND, swelling in lower extremities, anasarca, dizziness, palpitations, syncope.   GI  No heartburn, indigestion, abdominal pain, nausea, vomiting, diarrhea, change in bowel habits, loss of  appetite, bloody stools.   Resp: No shortness of breath with exertion or at rest.  No excess mucus, no productive cough,  + non-productive cough,  No coughing up  of blood.  No change in color of mucus.  No wheezing.  No chest wall deformity  Skin: no rash or lesions.  GU: no dysuria, change in color of urine, no urgency or frequency.  No flank pain, no hematuria   MS:  No joint pain or swelling.  No decreased range of motion.  No back pain.  Psych:  No change in mood or affect. No depression or anxiety.  No memory loss.   Vital Signs BP 140/80 (BP Location: Right Arm, Patient Position: Sitting, Cuff Size: Normal)   Pulse 90   Temp 98.2 F (36.8 C) (Oral)   Ht 5\' 5"  (1.651 m)   Wt 140 lb (63.5 kg)   SpO2 94%   BMI 23.30 kg/m    Physical Exam:  General- No distress,  A&Ox3, pleasant ENT: No sinus tenderness, TM clear, pale nasal mucosa, no oral exudate,no post nasal drip, no LAN Cardiac: S1, S2, regular rate and rhythm, no murmur Chest: No wheeze/ rales/ dullness; no accessory muscle use, no nasal flaring, no sternal retractions, + crackles per bases Abd.: Soft Non-tender, ND, BS +Body mass index is 23.3 kg/m. Ext: No clubbing cyanosis, edema Neuro:  normal strength, MAE x 4, A&O x 3 Skin: No rashes, warm and dry, no lesions  Psych: normal mood and behavior   Assessment/Plan Post Treatment for unresectable stage IIb (T2 a, N1, M0)  right lower lobe non-small cell lung cancer, squamous cell carcinoma with right hilar lymphadenopathy in  12/2021 Completed chemoradiation 02/2022 Restaging CT Chest >> Marked interval decrease in the soft tissue associated with the RIGHT lower lobe mass, now mainly cystic and measuring 2.6 x 1.9 cm, previously 3.4 x 3.0 cm. Only a thin rim of soft tissue is seen at the periphery. Decreased size of RIGHT hilar lymph nodes now 12 mm with subcentimeter mediastinal lymph nodes slightly diminished and remaining less than a cm. Plan Your PFT's do  not show COPD. You do have some emphysema from smoking.  We will follow this as needed.  Follow up CT Chest 06/27/2022 as is scheduled. Follow up in 4 months after next follow up CT Chest to re-evaluate ? ILD findings.  Let us know if you need [place] sooner.  Please contact office for sooner follow up if symptoms do not improve or worsen or seek emergency care    I spent 45 minutes dedicated to the care of this patient on the date of this encounter to include pre-visit review of records, face-to-face time with the patient discussing conditions above, post visit ordering of testing, clinical documentation with the electronic health record, making appropriate referrals as documented, and communicating necessary information to the patient's healthcare team.    Magdalen Spatz, NP 04/14/2022  5:45 PM

## 2022-04-26 ENCOUNTER — Telehealth: Payer: Self-pay | Admitting: Internal Medicine

## 2022-04-26 NOTE — Telephone Encounter (Signed)
Called patient regarding upcoming September appointments, patient has been called and notified.

## 2022-05-15 ENCOUNTER — Other Ambulatory Visit: Payer: Self-pay | Admitting: Dermatology

## 2022-05-17 MED ORDER — ACITRETIN 25 MG PO CAPS: 25.0000 mg | ORAL_CAPSULE | Freq: Every day | ORAL | 3 refills | Status: DC

## 2022-05-17 NOTE — Addendum Note (Signed)
Addended by: Sheran Lawless on: 05/17/2022 07:37 AM   Modules accepted: Orders

## 2022-05-21 ENCOUNTER — Other Ambulatory Visit: Payer: Self-pay | Admitting: Internal Medicine

## 2022-06-08 ENCOUNTER — Telehealth: Payer: Self-pay | Admitting: Acute Care

## 2022-06-08 NOTE — Telephone Encounter (Signed)
Called patient to change appointment on 10/27 due to provider schedule change- patient informed she tested positive for COVID at her PCP office on Tuesday. Patient states she is feeling better today but her cough and oxygen levels in the 80's are concerning her. Patient was given azithromycin by her PCP.  Please advise.

## 2022-06-08 NOTE — Telephone Encounter (Signed)
Primary Pulmonologist: [facility] Last office visit and with whom: Eric Form 04/14/22 What do we see them for (pulmonary problems): Lung neoplasm  Last OV assessment/plan:  Assessment/Plan Post Treatment for unresectable stage IIb (T2 a, N1, M0)  right lower lobe non-small cell lung cancer, squamous cell carcinoma with right hilar lymphadenopathy in  12/2021 Completed chemoradiation 02/2022 Restaging CT Chest >> Marked interval decrease in the soft tissue associated with the RIGHT lower lobe mass, now mainly cystic and measuring 2.6 x 1.9 cm, previously 3.4 x 3.0 cm. Only a thin rim of soft tissue is seen at the periphery. Decreased size of RIGHT hilar lymph nodes now 12 mm with subcentimeter mediastinal lymph nodes slightly diminished and remaining less than a cm. Plan Your PFT's do not show COPD. You do have some emphysema from smoking.  We will follow this as needed.  Follow up CT Chest 06/27/2022 as is scheduled. Follow up in 4 months after next follow up CT Chest to re-evaluate ? ILD findings.  Let us know if you need [place] sooner.  Please contact office for sooner follow up if symptoms do not improve or worsen or seek emergency care    Was appointment offered to patient (explain)?  Video offered and denied  Reason for call: Covid +  (examples of things to ask: :  When did symptoms start?  Fever? no Cough? yes Productive? no Color to sputum? none More sputum than usual? no Wheezing? no Have you needed increased oxygen? sats were low 80s at PCP office checked pulse ox while on phone it was 92  Are you taking your respiratory medications? azithromycin and steroids given by her PCP What over the counter measures have you tried?) she is currently feeling better and states if things get worse she will call for a video visit  Allergies  Allergen Reactions   Alpha-Gal     Other reaction(s): Abdominal Pain   Other Other (See Comments)   Penicillins     Unknown type of reaction    Prozac [Fluoxetine] Rash    Immunization History  Administered Date(s) Administered   Influenza, Quadrivalent, Recombinant, Inj, Pf 07/24/2019   Influenza,inj,Quad PF,6+ Mos 07/15/2016, 08/03/2017, 08/29/2018   Influenza-Unspecified 07/16/2021   Moderna Sars-Covid-2 Vaccination 12/25/2019, 01/24/2020   Pneumococcal Polysaccharide-23 09/29/2013

## 2022-06-09 NOTE — Telephone Encounter (Signed)
Patient is aware of below message/recommendations and voiced her understanding.  She stated that she turned the corner yesterday and she is feeling great. Nothing further needed.

## 2022-06-24 ENCOUNTER — Other Ambulatory Visit: Payer: Self-pay | Admitting: Family Medicine

## 2022-06-24 DIAGNOSIS — J3089 Other allergic rhinitis: Secondary | ICD-10-CM

## 2022-06-27 ENCOUNTER — Other Ambulatory Visit: Payer: Self-pay

## 2022-06-27 ENCOUNTER — Encounter (HOSPITAL_COMMUNITY): Payer: Self-pay

## 2022-06-27 ENCOUNTER — Ambulatory Visit (HOSPITAL_COMMUNITY)
Admission: RE | Admit: 2022-06-27 | Discharge: 2022-06-27 | Disposition: A | Discharge status: Home / Self Care | Payer: 59 | Source: Ambulatory Visit | Attending: Physician Assistant | Admitting: Physician Assistant

## 2022-06-27 ENCOUNTER — Inpatient Hospital Stay: Payer: 59 | Attending: Internal Medicine

## 2022-06-27 DIAGNOSIS — J439 Emphysema, unspecified: Secondary | ICD-10-CM | POA: Insufficient documentation

## 2022-06-27 DIAGNOSIS — C3491 Malignant neoplasm of unspecified part of right bronchus or lung: Secondary | ICD-10-CM

## 2022-06-27 DIAGNOSIS — J479 Bronchiectasis, uncomplicated: Secondary | ICD-10-CM | POA: Insufficient documentation

## 2022-06-27 DIAGNOSIS — Z923 Personal history of irradiation: Secondary | ICD-10-CM | POA: Insufficient documentation

## 2022-06-27 DIAGNOSIS — C3431 Malignant neoplasm of lower lobe, right bronchus or lung: Secondary | ICD-10-CM | POA: Insufficient documentation

## 2022-06-27 DIAGNOSIS — Z9221 Personal history of antineoplastic chemotherapy: Secondary | ICD-10-CM | POA: Insufficient documentation

## 2022-06-27 LAB — CBC WITH DIFFERENTIAL (CANCER CENTER ONLY)
Abs Immature Granulocytes: 0.02 10*3/uL (ref 0.00–0.07)
Basophils Absolute: 0 10*3/uL (ref 0.0–0.1)
Basophils Relative: 0 %
Eosinophils Absolute: 0.2 10*3/uL (ref 0.0–0.5)
Eosinophils Relative: 3 %
HCT: 39.4 % (ref 36.0–46.0)
Hemoglobin: 13.4 g/dL (ref 12.0–15.0)
Immature Granulocytes: 0 %
Lymphocytes Relative: 18 %
Lymphs Abs: 1.1 10*3/uL (ref 0.7–4.0)
MCH: 32.1 pg (ref 26.0–34.0)
MCHC: 34 g/dL (ref 30.0–36.0)
MCV: 94.5 fL (ref 80.0–100.0)
Monocytes Absolute: 0.5 10*3/uL (ref 0.1–1.0)
Monocytes Relative: 8 %
Neutro Abs: 4.2 10*3/uL (ref 1.7–7.7)
Neutrophils Relative %: 71 %
Platelet Count: 303 10*3/uL (ref 150–400)
RBC: 4.17 MIL/uL (ref 3.87–5.11)
RDW: 12 % (ref 11.5–15.5)
WBC Count: 6 10*3/uL (ref 4.0–10.5)
nRBC: 0 % (ref 0.0–0.2)

## 2022-06-27 LAB — CMP (CANCER CENTER ONLY)
ALT: 13 U/L (ref 0–44)
AST: 18 U/L (ref 15–41)
Albumin: 4.5 g/dL (ref 3.5–5.0)
Alkaline Phosphatase: 66 U/L (ref 38–126)
Anion gap: 7 (ref 5–15)
BUN: 16 mg/dL (ref 8–23)
CO2: 31 mmol/L (ref 22–32)
Calcium: 10.3 mg/dL (ref 8.9–10.3)
Chloride: 102 mmol/L (ref 98–111)
Creatinine: 0.76 mg/dL (ref 0.44–1.00)
GFR, Estimated: >60 mL/min (ref >60)
Glucose, Bld: 111 mg/dL — ABNORMAL HIGH (ref 70–99)
Potassium: 4.1 mmol/L (ref 3.5–5.1)
Sodium: 140 mmol/L (ref 135–145)
Total Bilirubin: 0.5 mg/dL (ref 0.3–1.2)
Total Protein: 8.9 g/dL — ABNORMAL HIGH (ref 6.5–8.1)

## 2022-06-27 MED ORDER — SODIUM CHLORIDE (PF) 0.9 % IJ SOLN
INTRAMUSCULAR | Status: AC
  Filled 2022-06-27: qty 50

## 2022-06-27 MED ORDER — IOHEXOL 300 MG/ML  SOLN
75.0000 mL | Freq: Once | INTRAMUSCULAR | Status: AC | PRN
  Administered 2022-06-27: 75 mL via INTRAVENOUS

## 2022-06-29 ENCOUNTER — Encounter: Payer: Self-pay | Admitting: Internal Medicine

## 2022-06-29 ENCOUNTER — Inpatient Hospital Stay (HOSPITAL_BASED_OUTPATIENT_CLINIC_OR_DEPARTMENT_OTHER): Payer: 59 | Admitting: Internal Medicine

## 2022-06-29 VITALS — BP 135/86 | HR 95 | Temp 97.8°F | Resp 15 | Wt 143.7 lb

## 2022-06-29 DIAGNOSIS — Z923 Personal history of irradiation: Secondary | ICD-10-CM | POA: Diagnosis not present

## 2022-06-29 DIAGNOSIS — C3431 Malignant neoplasm of lower lobe, right bronchus or lung: Secondary | ICD-10-CM | POA: Diagnosis present

## 2022-06-29 DIAGNOSIS — J479 Bronchiectasis, uncomplicated: Secondary | ICD-10-CM | POA: Diagnosis not present

## 2022-06-29 DIAGNOSIS — C3490 Malignant neoplasm of unspecified part of unspecified bronchus or lung: Secondary | ICD-10-CM | POA: Diagnosis not present

## 2022-06-29 DIAGNOSIS — J439 Emphysema, unspecified: Secondary | ICD-10-CM | POA: Diagnosis not present

## 2022-06-29 DIAGNOSIS — Z9221 Personal history of antineoplastic chemotherapy: Secondary | ICD-10-CM | POA: Diagnosis not present

## 2022-06-29 NOTE — Progress Notes (Signed)
[facility] [phone]) [phone]   Fax:([phone]) [phone]  OFFICE PROGRESS NOTE  Glenda Chroman, MD [place] [address]  DIAGNOSIS: Unresectable stage IIB (T2a, N1, M0) non-small cell lung cancer, squamous cell carcinoma presented with right lower lobe lung mass in addition to right hilar lymphadenopathy diagnosed in March 2023.    The patient had PD-L1 expression of 99%.  PRIOR THERAPY: A course of concurrent chemoradiation with weekly carboplatin for AUC of 2 and paclitaxel 45 Mg/M2.  First dose started 01/16/2022.  Status post 7 cycles.  Last dose was given on 02/27/2022.  CURRENT THERAPY: Observation.  INTERVAL HISTORY: Diane [name] 66 y.o. female returns to the clinic today for 6-monthfollow-up visit.  The patient is feeling fine today with no concerning complaints except for mild cough.  She was diagnosed with COVID-19 few weeks ago and treated with prednisone as well as azithromycin.  She denied having any current chest pain or hemoptysis.  She has no nausea, vomiting, diarrhea or constipation.  She has no headache or visual changes.  She is here today for evaluation with repeat CT scan of the chest for restaging of her disease.  MEDICAL HISTORY: Past Medical History:  Diagnosis Date   Anxiety    Arthritis    fingers, left foot   C. difficile diarrhea 2015   COPD (chronic obstructive pulmonary disease) (HCC)    Depression    Eczema    H pylori ulcer    Headache(784.0)    Hypercholesterolemia    Hypertension    Kidney stones    20 years ago   Pneumonia    5 years ago   Seizures ([facility]    2 years ago, "cluster of seizures" none since   Shingles     ALLERGIES:  is allergic to alpha-gal, other, penicillins, and prozac [fluoxetine].  MEDICATIONS:  Current Outpatient Medications  Medication Sig Dispense Refill   acitretin (SORIATANE) 25 MG capsule Take 1 capsule (25 mg total) by mouth daily. 30 capsule 3   ALPRAZolam (XANAX) 0.5 MG  tablet Take 0.5 mg by mouth daily as needed.     cetirizine (ZYRTEC) 10 MG tablet Take 10 mg by mouth at bedtime.     EPINEPHrine (AUVI-Q) 0.3 mg/0.3 mL IJ SOAJ injection Inject 0.3 mLs (0.3 mg total) into the muscle as needed for anaphylaxis. 2 each 1   fluticasone (FLONASE) 50 MCG/ACT nasal spray Place 2 sprays into both nostrils daily. (Patient taking differently: Place 2 sprays into both nostrils daily as needed for allergies.) 16 g 5   hydrochlorothiazide (MICROZIDE) 12.5 MG capsule Take 12.5 mg by mouth daily.     omeprazole (PRILOSEC) 20 MG capsule Take 1 capsule (20 mg total) by mouth 2 (two) times daily. (Needs to be seen before next refill) 60 capsule 0   OVER THE COUNTER MEDICATION Take 1 capsule by mouth daily. Probiotic     rosuvastatin (CRESTOR) 10 MG tablet Take 10 mg by mouth at bedtime.     No current facility-administered medications for this visit.    SURGICAL HISTORY:  Past Surgical History:  Procedure Laterality Date   ABDOMINAL HYSTERECTOMY     ADENOIDECTOMY     APPENDECTOMY     BRONCHIAL BIOPSY  12/20/2021   Procedure: BRONCHIAL BIOPSIES;  Surgeon: IGarner Nash DO;  [facility];  Service: Pulmonary;;   BRONCHIAL BRUSHINGS  12/20/2021   Procedure: BRONCHIAL BRUSHINGS;  Surgeon: IGarner Nash DO;  [facility];  Service: Pulmonary;;   BRONCHIAL NEEDLE ASPIRATION BIOPSY  12/20/2021   Procedure: BRONCHIAL NEEDLE ASPIRATION BIOPSIES;  Surgeon: Garner Nash, DO;  Location: [facility];  Service: Pulmonary;;   CESAREAN SECTION     CHOLECYSTECTOMY     COLONOSCOPY  06/10/2012   RMR: Colonic polyps -removed as described above.    FINE NEEDLE ASPIRATION  12/20/2021   Procedure: FINE NEEDLE ASPIRATION (FNA) LINEAR;  Surgeon: Garner Nash, DO;  Location: [facility];  Service: Pulmonary;;   KNEE ARTHROSCOPY  1973   right knee,  torn cart   LUMBAR DISC SURGERY  05/12/2015   L4 L5   LUMBAR LAMINECTOMY/DECOMPRESSION MICRODISCECTOMY Left  05/12/2015   Procedure: Left L4-5 Microdiscectomy;  Surgeon: Marybelle Killings, MD;  Location: [facility];  Service: Orthopedics;  Laterality: Left;   TONSILLECTOMY     VIDEO BRONCHOSCOPY WITH ENDOBRONCHIAL ULTRASOUND Bilateral 12/20/2021   Procedure: VIDEO BRONCHOSCOPY WITH ENDOBRONCHIAL ULTRASOUND;  Surgeon: Garner Nash, DO;  Location: [facility];  Service: Pulmonary;  Laterality: Bilateral;   VIDEO BRONCHOSCOPY WITH RADIAL ENDOBRONCHIAL ULTRASOUND  12/20/2021   Procedure: RADIAL ENDOBRONCHIAL ULTRASOUND;  Surgeon: Garner Nash, DO;  Location: [facility];  Service: Pulmonary;;    REVIEW OF SYSTEMS:  A comprehensive review of systems was negative except for: Respiratory: positive for cough   PHYSICAL EXAMINATION: General appearance: alert, cooperative, and no distress Head: Normocephalic, without obvious abnormality, atraumatic Neck: no adenopathy, no JVD, supple, symmetrical, trachea midline, and thyroid not enlarged, symmetric, no tenderness/mass/nodules Lymph nodes: Cervical, supraclavicular, and axillary nodes normal. Resp: clear to auscultation bilaterally Back: symmetric, no curvature. ROM normal. No CVA tenderness. Cardio: regular rate and rhythm, S1, S2 normal, no murmur, click, rub or gallop GI: soft, non-tender; bowel sounds normal; no masses,  no organomegaly Extremities: extremities normal, atraumatic, no cyanosis or edema  ECOG PERFORMANCE STATUS: 1 - Symptomatic but completely ambulatory  Blood pressure 135/86, pulse 95, temperature 97.8 F (36.6 C), temperature source Oral, resp. rate 15, weight 143 lb 11.2 oz (65.2 kg), SpO2 96 %.  LABORATORY DATA: Lab Results  Component Value Date   WBC 6.0 06/27/2022   HGB 13.4 06/27/2022   HCT 39.4 06/27/2022   MCV 94.5 06/27/2022   PLT 303 06/27/2022      Chemistry      Component Value Date/Time   NA 140 06/27/2022 1008   NA 139 12/03/2019 0928   K 4.1 06/27/2022 1008   CL 102 06/27/2022 1008   CO2 31 06/27/2022 1008    BUN 16 06/27/2022 1008   BUN 14 12/03/2019 0928   CREATININE 0.76 06/27/2022 1008   CREATININE 0.64 10/14/2021 0953      Component Value Date/Time   CALCIUM 10.3 06/27/2022 1008   ALKPHOS 66 06/27/2022 1008   AST 18 06/27/2022 1008   ALT 13 06/27/2022 1008   BILITOT 0.5 06/27/2022 1008       RADIOGRAPHIC STUDIES: CT Chest W Contrast  Result Date: 06/28/2022 CLINICAL DATA:  Non-small cell lung cancer. Assess treatment response. * Tracking Code: BO * EXAM: CT CHEST WITH CONTRAST TECHNIQUE: Multidetector CT imaging of the chest was performed during intravenous contrast administration. RADIATION DOSE REDUCTION: This exam was performed according to the departmental dose-optimization program which includes automated exposure control, adjustment of the mA and/or kV according to patient size and/or use of iterative reconstruction technique. CONTRAST:  24m OMNIPAQUE IOHEXOL 300 MG/ML  SOLN COMPARISON:  Chest CT 03/16/2022 and 12/13/2021.  PET-CT 12/13/2021. FINDINGS: Cardiovascular: No acute vascular  findings are identified. There is atherosclerosis of the aorta, great vessels and coronary arteries. The heart size is normal. There is no pericardial effusion. Mediastinum/Nodes: Unchanged mildly prominent right hilar and mediastinal lymph nodes, including a right hilar node measuring 1.2 cm on image 63/2, a precarinal node measuring 0.8 cm on image 57/2 and a subcarinal node measuring 0.7 cm on image 67/2. No progressive adenopathy. The thyroid gland, trachea and esophagus demonstrate no significant findings. Lungs/Pleura: No pleural effusion or pneumothorax. Interval decreased size and decreased cavitation of the superior segment right lower lobe nodule which measures 2.0 x 1.2 cm on image 67/7 (previously 2.1 x 2.0 cm). No new or enlarging pulmonary nodules are identified. Surrounding the nodule, there are new bandlike opacities peripherally in the right upper and lower lobes which are probably treatment  related, although potentially infectious/inflammatory. Otherwise stable chronic lung disease with centrilobular and paraseptal emphysema, subpleural reticulation, traction bronchiectasis and probable early honeycomb formation in both lower lobes. Upper abdomen: No significant findings are visualized in the upper abdomen status post cholecystectomy. Musculoskeletal/Chest wall: There is no chest wall mass or suspicious osseous finding. IMPRESSION: 1. Decreased size and resolution of cavitation in the right lower lobe pulmonary nodule consistent with response to treatment. 2. Probable treatment related changes in the surrounding right upper and lower lobes versus infection/inflammation. No new or enlarging pulmonary nodules. 3. Stable small mediastinal lymph nodes, likely reactive/benign based on stability. 4. Chronic lung disease with emphysema, subpleural reticulation and traction bronchiectasis. 5. Coronary and aortic atherosclerosis (ICD10-I70.0). Emphysema (ICD10-J43.9). Electronically Signed   By: Richardean Sale M.D.   On: 06/28/2022 10:16    ASSESSMENT AND PLAN: This is a very pleasant 66  years old white female diagnosed with unresectable stage IIb (T2 a, N1, M0) non-small cell lung cancer, squamous cell carcinoma with right lower lobe lung mass in addition to right hilar lymphadenopathy in March 2023. The patient has PD-L1 expression of 99%. She completed a course of concurrent chemoradiation with weekly carboplatin for AUC of 2 and paclitaxel 45 Mg/M2 status post 7 cycles.  Last dose was given on 02/27/2022. The patient is currently on observation and she is feeling fine. She had repeat CT scan of the chest performed recently.  I personally and independently reviewed the scan and discussed the result with the patient and her husband. Her scan showed no concerning findings for disease progression and there was further improvement of her disease with decrease in the size and resolution of the cavitation  in the right lower lobe pulmonary nodule. I recommended for the patient to continue on observation with repeat CT scan of the chest in 3 months. For the history of emphysema and bronchiectasis, she is followed by pulmonary medicine. The patient was advised to call immediately if she has any other concerning symptoms in the interval. The patient voices understanding of current disease status and treatment options and is in agreement with the current care plan.  All questions were answered. The patient knows to call the clinic with any problems, questions or concerns. We can certainly see the patient much sooner if necessary.  The total time spent in the appointment was 25 minutes.  Disclaimer: This note was dictated with voice recognition software. Similar sounding words can inadvertently be transcribed and may not be corrected upon review.

## 2022-06-30 ENCOUNTER — Telehealth: Payer: Self-pay | Admitting: Internal Medicine

## 2022-06-30 NOTE — Telephone Encounter (Signed)
Scheduled follow-up appointments per 9/14 los. Patient is aware.

## 2022-07-14 ENCOUNTER — Ambulatory Visit (INDEPENDENT_AMBULATORY_CARE_PROVIDER_SITE_OTHER): Payer: 59 | Admitting: Acute Care

## 2022-07-14 ENCOUNTER — Encounter: Payer: Self-pay | Admitting: Acute Care

## 2022-07-14 VITALS — BP 134/76 | HR 81 | Temp 97.9°F | Ht 65.0 in | Wt 145.8 lb

## 2022-07-14 DIAGNOSIS — G4700 Insomnia, unspecified: Secondary | ICD-10-CM | POA: Diagnosis not present

## 2022-07-14 DIAGNOSIS — R0683 Snoring: Secondary | ICD-10-CM

## 2022-07-14 DIAGNOSIS — J479 Bronchiectasis, uncomplicated: Secondary | ICD-10-CM

## 2022-07-14 DIAGNOSIS — R0609 Other forms of dyspnea: Secondary | ICD-10-CM

## 2022-07-14 DIAGNOSIS — C3490 Malignant neoplasm of unspecified part of unspecified bronchus or lung: Secondary | ICD-10-CM | POA: Diagnosis not present

## 2022-07-14 MED ORDER — PREDNISONE 10 MG PO TABS: ORAL_TABLET | ORAL | 0 refills | Status: DC

## 2022-07-14 MED ORDER — DOXYCYCLINE HYCLATE 100 MG PO TABS: 100.0000 mg | ORAL_TABLET | Freq: Two times a day (BID) | ORAL | 0 refills | Status: DC

## 2022-07-14 MED ORDER — ANORO ELLIPTA 62.5-25 MCG/ACT IN AEPB: 1.0000 | INHALATION_SPRAY | Freq: Every day | RESPIRATORY_TRACT | 3 refills | Status: DC

## 2022-07-14 NOTE — Progress Notes (Unsigned)
History of Present Illness Diane [name] is a 66 y.o. female former smoker diagnosed with unresectable stage IIb (T2 a, N1, M0) non-small cell lung cancer, squamous cell carcinoma with right lower lobe lung mass in addition to right hilar lymphadenopathy in March 2023.She is followed by pulmonary for her bronchiectasis and emphysema.   07/14/2022 Pt. Presents for follow up. She has done well with her cancer treatment, but her breathing has worsened since she had  Covid 1 month ago. She had headache and body aches, but minimal respiratory issues.Since her Covid diagnosis she has noted significant increase in dyspnea with minimal exertion . She is coughing up greenish thick secretions . She does have a productive cough. Per her husband she has been wheezing.   Ask Mohammed if 3 month follow up CT Chest can be HRCT to evaluate for    Test Results: CT Chest with contrast 06/27/2022 Decreased size and resolution of cavitation in the right lower lobe pulmonary nodule consistent with response to treatment. 2. Probable treatment related changes in the surrounding right upper and lower lobes versus infection/inflammation. No new or enlarging pulmonary nodules. 3. Stable small mediastinal lymph nodes, likely reactive/benign based on stability. 4. Chronic lung disease with emphysema, subpleural reticulation and traction bronchiectasis. 5. Coronary and aortic atherosclerosis (ICD10-I70.0). Emphysema (ICD10-J43.9).    Latest Ref Rng & Units 06/27/2022   10:08 AM 03/23/2022   10:11 AM 02/27/2022    9:12 AM  CBC  WBC 4.0 - 10.5 K/uL 6.0  5.5  2.2   Hemoglobin 12.0 - 15.0 g/dL 13.4  12.0  11.5   Hematocrit 36.0 - 46.0 % 39.4  35.3  33.6   Platelets 150 - 400 K/uL 303  271  242        Latest Ref Rng & Units 06/27/2022   10:08 AM 03/23/2022   10:11 AM 02/27/2022    9:12 AM  BMP  Glucose 70 - 99 mg/dL 111  97  102   BUN 8 - 23 mg/dL 16  10  10    Creatinine 0.44 - 1.00 mg/dL 0.76  0.61  0.84    Sodium 135 - 145 mmol/L 140  137  132   Potassium 3.5 - 5.1 mmol/L 4.1  4.1  4.0   Chloride 98 - 111 mmol/L 102  100  98   CO2 22 - 32 mmol/L 31  29  26    Calcium 8.9 - 10.3 mg/dL 10.3  9.8  9.6     BNP No results found for: "BNP"  ProBNP No results found for: "PROBNP"  PFT    Component Value Date/Time   FEV1PRE 1.84 01/04/2022 0951   FEV1POST 1.91 01/04/2022 0951   FVCPRE 2.54 01/04/2022 0951   FVCPOST 2.69 01/04/2022 0951   TLC 3.94 01/04/2022 0951   DLCOUNC 9.15 01/04/2022 0951   PREFEV1FVCRT 73 01/04/2022 0951   PSTFEV1FVCRT 71 01/04/2022 0951    CT Chest W Contrast  Result Date: 06/28/2022 CLINICAL DATA:  Non-small cell lung cancer. Assess treatment response. * Tracking Code: BO * EXAM: CT CHEST WITH CONTRAST TECHNIQUE: Multidetector CT imaging of the chest was performed during intravenous contrast administration. RADIATION DOSE REDUCTION: This exam was performed according to the departmental dose-optimization program which includes automated exposure control, adjustment of the mA and/or kV according to patient size and/or use of iterative reconstruction technique. CONTRAST:  66mL OMNIPAQUE IOHEXOL 300 MG/ML  SOLN COMPARISON:  Chest CT 03/16/2022 and 12/13/2021.  PET-CT 12/13/2021. FINDINGS: Cardiovascular: No acute vascular findings are  identified. There is atherosclerosis of the aorta, great vessels and coronary arteries. The heart size is normal. There is no pericardial effusion. Mediastinum/Nodes: Unchanged mildly prominent right hilar and mediastinal lymph nodes, including a right hilar node measuring 1.2 cm on image 63/2, a precarinal node measuring 0.8 cm on image 57/2 and a subcarinal node measuring 0.7 cm on image 67/2. No progressive adenopathy. The thyroid gland, trachea and esophagus demonstrate no significant findings. Lungs/Pleura: No pleural effusion or pneumothorax. Interval decreased size and decreased cavitation of the superior segment right lower lobe nodule  which measures 2.0 x 1.2 cm on image 67/7 (previously 2.1 x 2.0 cm). No new or enlarging pulmonary nodules are identified. Surrounding the nodule, there are new bandlike opacities peripherally in the right upper and lower lobes which are probably treatment related, although potentially infectious/inflammatory. Otherwise stable chronic lung disease with centrilobular and paraseptal emphysema, subpleural reticulation, traction bronchiectasis and probable early honeycomb formation in both lower lobes. Upper abdomen: No significant findings are visualized in the upper abdomen status post cholecystectomy. Musculoskeletal/Chest wall: There is no chest wall mass or suspicious osseous finding. IMPRESSION: 1. Decreased size and resolution of cavitation in the right lower lobe pulmonary nodule consistent with response to treatment. 2. Probable treatment related changes in the surrounding right upper and lower lobes versus infection/inflammation. No new or enlarging pulmonary nodules. 3. Stable small mediastinal lymph nodes, likely reactive/benign based on stability. 4. Chronic lung disease with emphysema, subpleural reticulation and traction bronchiectasis. 5. Coronary and aortic atherosclerosis (ICD10-I70.0). Emphysema (ICD10-J43.9). Electronically Signed   By: Richardean Sale M.D.   On: 06/28/2022 10:16     Past medical hx Past Medical History:  Diagnosis Date   Anxiety    Arthritis    fingers, left foot   C. difficile diarrhea 2015   COPD (chronic obstructive pulmonary disease) (HCC)    Depression    Eczema    H pylori ulcer    Headache(784.0)    Hypercholesterolemia    Hypertension    Kidney stones    20 years ago   Pneumonia    5 years ago   Seizures ([place])    2 years ago, "cluster of seizures" none since   Shingles      Social History   Tobacco Use   Smoking status: Former    Packs/day: 0.50    Years: 30.00    Total pack years: 15.00    Types: Cigarettes    Quit date: 10/09/2021     Years since quitting: 0.7   Smokeless tobacco: Never   Tobacco comments:    5-10 cigarettes a day (plans to quit prior to this surgery 05/06/15)  Vaping Use   Vaping Use: Some days  Substance Use Topics   Alcohol use: Yes    Alcohol/week: 21.0 standard drinks of alcohol    Types: 21 Shots of liquor per week    Comment: 3 per day   Drug use: No    Ms.Riecke reports that she quit smoking about 9 months ago. Her smoking use included cigarettes. She has a 15.00 pack-year smoking history. She has never used smokeless tobacco. She reports current alcohol use of about 21.0 standard drinks of alcohol per week. She reports that she does not use drugs.  Tobacco Cessation: Counseling given: Not Answered Tobacco comments: 5-10 cigarettes a day (plans to quit prior to this surgery 05/06/15)   Past surgical hx, Family hx, Social hx all reviewed.  Current Outpatient Medications on File Prior to Visit  Medication Sig   ALPRAZolam (XANAX) 0.5 MG tablet Take 0.5 mg by mouth daily as needed.   cetirizine (ZYRTEC) 10 MG tablet Take 10 mg by mouth at bedtime.   EPINEPHrine (AUVI-Q) 0.3 mg/0.3 mL IJ SOAJ injection Inject 0.3 mLs (0.3 mg total) into the muscle as needed for anaphylaxis.   fluticasone (FLONASE) 50 MCG/ACT nasal spray Place 2 sprays into both nostrils daily. (Patient taking differently: Place 2 sprays into both nostrils daily as needed for allergies.)   hydrochlorothiazide (MICROZIDE) 12.5 MG capsule Take 12.5 mg by mouth daily.   hydrOXYzine (ATARAX) 25 MG tablet Take 25 mg by mouth daily.   omeprazole (PRILOSEC) 20 MG capsule Take 1 capsule (20 mg total) by mouth 2 (two) times daily. (Needs to be seen before next refill)   OVER THE COUNTER MEDICATION Take 1 capsule by mouth daily. Probiotic   rosuvastatin (CRESTOR) 10 MG tablet Take 10 mg by mouth at bedtime.   No current facility-administered medications on file prior to visit.     Allergies  Allergen Reactions   Alpha-Gal      Other reaction(s): Abdominal Pain   Other Other (See Comments)   Penicillins     Unknown type of reaction   Prozac [Fluoxetine] Rash    Review Of Systems:  Constitutional:   No  weight loss, night sweats,  Fevers, chills, fatigue, or  lassitude.  HEENT:   No headaches,  Difficulty swallowing,  Tooth/dental problems, or  Sore throat,                No sneezing, itching, ear ache, nasal congestion, post nasal drip,   CV:  No chest pain,  Orthopnea, PND, swelling in lower extremities, anasarca, dizziness, palpitations, syncope.   GI  No heartburn, indigestion, abdominal pain, nausea, vomiting, diarrhea, change in bowel habits, loss of appetite, bloody stools.   Resp: No shortness of breath with exertion or at rest.  No excess mucus, no productive cough,  No non-productive cough,  No coughing up of blood.  No change in color of mucus.  No wheezing.  No chest wall deformity  Skin: no rash or lesions.  GU: no dysuria, change in color of urine, no urgency or frequency.  No flank pain, no hematuria   MS:  No joint pain or swelling.  No decreased range of motion.  No back pain.  Psych:  No change in mood or affect. No depression or anxiety.  No memory loss.   Vital Signs Pulse 81   Temp 97.9 F (36.6 C) (Oral)   Ht 5\' 5"  (1.651 m)   Wt 145 lb 12.8 oz (66.1 kg)   SpO2 96%   BMI 24.26 kg/m    Physical Exam:  General- No distress,  A&Ox3 ENT: No sinus tenderness, TM clear, pale nasal mucosa, no oral exudate,no post nasal drip, no LAN Cardiac: S1, S2, regular rate and rhythm, no murmur Chest: No wheeze/ rales/ dullness; no accessory muscle use, no nasal flaring, no sternal retractions Abd.: Soft Non-tender Ext: No clubbing cyanosis, edema Neuro:  normal strength Skin: No rashes, warm and dry Psych: normal mood and behavior   Assessment/Plan  No problem-specific Assessment & Plan notes found for this encounter.    Magdalen Spatz, NP 07/14/2022  10:43 AM

## 2022-07-14 NOTE — Patient Instructions (Addendum)
It is good to see you today. We will send in a prescription for Doxycycline 100 mg twice daily with a full glass of water. Wear sun block if in the sun We will send in a prednisone taper Prednisone taper; 10 mg tablets: 4 tabs x 2 days, 3 tabs x 2 days, 2 tabs x 2 days 1 tab x 2 days then stop.  We will send in a prescription for Anoro One puff once daily without fail. This is a maintenance inhaler.  Rinse mouth after use.  Add Mucinex 1200 mg daily with a full glass of water We will give you a flutter valve to use . Use this several times a day. We will do a home sleep study to evaluate for sleep apnea.  You will get a call to scheduled this. We will refer you to Pulmonary rehab. ( Dr. Valeta Harms) COPD/ bronchiectasis I will check with Dr. Julien Nordmann to see if he is ok with HRCT as follow up in 3 months.  Follow up in 3 weeks with Judson Roch NP. Please contact office for sooner follow up if symptoms do not improve or worsen or seek emergency care   Will need Pneumonia and RSV vaccines when better.  Marland Kitchen

## 2022-07-17 ENCOUNTER — Other Ambulatory Visit (HOSPITAL_COMMUNITY): Payer: Self-pay

## 2022-07-17 DIAGNOSIS — J449 Chronic obstructive pulmonary disease, unspecified: Secondary | ICD-10-CM

## 2022-07-17 DIAGNOSIS — J479 Bronchiectasis, uncomplicated: Secondary | ICD-10-CM

## 2022-07-18 ENCOUNTER — Encounter: Payer: Self-pay | Admitting: Acute Care

## 2022-07-25 ENCOUNTER — Encounter (HOSPITAL_COMMUNITY): Payer: Self-pay

## 2022-07-25 ENCOUNTER — Encounter (HOSPITAL_COMMUNITY)
Admission: RE | Admit: 2022-07-25 | Discharge: 2022-07-25 | Disposition: A | Discharge status: Home / Self Care | Payer: 59 | Source: Ambulatory Visit | Attending: Pulmonary Disease | Admitting: Pulmonary Disease

## 2022-07-25 VITALS — Ht 65.0 in | Wt 146.4 lb

## 2022-07-25 DIAGNOSIS — J479 Bronchiectasis, uncomplicated: Secondary | ICD-10-CM | POA: Insufficient documentation

## 2022-07-25 DIAGNOSIS — J449 Chronic obstructive pulmonary disease, unspecified: Secondary | ICD-10-CM | POA: Diagnosis present

## 2022-07-25 NOTE — Progress Notes (Signed)
Pulmonary Individual Treatment Plan  Patient Details  Name: Diane [name] MRN: [id number] Date of Birth: [birth year] Referring Provider:   Flowsheet Row PULMONARY REHAB COPD ORIENTATION from 07/25/2022 in [place]  Referring Provider Dr. Valeta Harms       Initial Encounter Date:  Flowsheet Row PULMONARY REHAB COPD ORIENTATION from 07/25/2022 in [place]  Date 07/25/22       Visit Diagnosis: Chronic obstructive pulmonary disease, unspecified COPD type ([place])  Bronchiectasis, uncomplicated ([place])  Patient's Home Medications on Admission:   Current Outpatient Medications:    cetirizine (ZYRTEC) 10 MG tablet, Take 10 mg by mouth at bedtime., Disp: , Rfl:    EPINEPHrine (AUVI-Q) 0.3 mg/0.3 mL IJ SOAJ injection, Inject 0.3 mLs (0.3 mg total) into the muscle as needed for anaphylaxis., Disp: 2 each, Rfl: 1   fluticasone (FLONASE) 50 MCG/ACT nasal spray, Place 2 sprays into both nostrils daily. (Patient taking differently: Place 2 sprays into both nostrils daily as needed for allergies.), Disp: 16 g, Rfl: 5   hydrochlorothiazide (MICROZIDE) 12.5 MG capsule, Take 12.5 mg by mouth daily., Disp: , Rfl:    hydrOXYzine (ATARAX) 25 MG tablet, Take 25 mg by mouth daily., Disp: , Rfl:    omeprazole (PRILOSEC) 20 MG capsule, Take 1 capsule (20 mg total) by mouth 2 (two) times daily. (Needs to be seen before next refill), Disp: 60 capsule, Rfl: 0   OVER THE COUNTER MEDICATION, Take 1 capsule by mouth daily. Probiotic, Disp: , Rfl:    rosuvastatin (CRESTOR) 10 MG tablet, Take 10 mg by mouth at bedtime., Disp: , Rfl:    umeclidinium-vilanterol (ANORO ELLIPTA) 62.5-25 MCG/ACT AEPB, Inhale 1 puff into the lungs daily., Disp: 60 each, Rfl: 3   ALPRAZolam (XANAX) 0.5 MG tablet, Take 0.5 mg by mouth daily as needed. (Patient not taking: Reported on 07/14/2022), Disp: , Rfl:    doxycycline (VIBRA-TABS) 100 MG tablet, Take 1 tablet (100 mg total) by mouth 2  (two) times daily. (Patient not taking: Reported on 07/25/2022), Disp: 14 tablet, Rfl: 0   predniSONE (DELTASONE) 10 MG tablet, Prednisone taper; 10 mg tablets: 4 tabs x 2 days, 3 tabs x 2 days, 2 tabs x 2 days 1 tab x 2 days then stop. (Patient not taking: Reported on 07/25/2022), Disp: 20 tablet, Rfl: 0  Past Medical History: Past Medical History:  Diagnosis Date   Anxiety    Arthritis    fingers, left foot   C. difficile diarrhea 2015   COPD (chronic obstructive pulmonary disease) (HCC)    Depression    Eczema    H pylori ulcer    Headache(784.0)    Hypercholesterolemia    Hypertension    Kidney stones    20 years ago   Pneumonia    5 years ago   Seizures ([place])    2 years ago, "cluster of seizures" none since   Shingles     Tobacco Use: Social History   Tobacco Use  Smoking Status Former   Packs/day: 0.50   Years: 30.00   Total pack years: 15.00   Types: Cigarettes   Quit date: 10/09/2021   Years since quitting: 0.7  Smokeless Tobacco Never  Tobacco Comments   5-10 cigarettes a day (plans to quit prior to this surgery 05/06/15)    Labs: Review Flowsheet  More data exists      Latest Ref Rng & Units 01/11/2018 12/03/2019 01/06/2021 02/17/2021 10/14/2021  Labs for ITP Cardiac and Pulmonary Rehab  Cholestrol <  200 mg/dL 221  273  232  258  173   LDL (calc) mg/dL (calc) 125  164  152  169  95   HDL-C > OR = 50 mg/dL 76  62  55  63  55   Trlycerides <150 mg/dL 100  255  123  129  129   Hemoglobin A1c <7.0 % 5.1  - - - -    Capillary Blood Glucose: Lab Results  Component Value Date   GLUCAP 100 (H) 12/13/2021     Pulmonary Assessment Scores:  Pulmonary Assessment Scores     Row Name 07/25/22 0824         ADL UCSD   SOB Score total 11     Rest 0     Walk 0     Stairs 1     Bath 0     Dress 0     Shop 0       CAT Score   CAT Score 21       mMRC Score   mMRC Score 1             UCSD: Self-administered rating of dyspnea associated with  activities of daily living (ADLs) 6-point scale (0 = "not at all" to 5 = "maximal or unable to do because of breathlessness")  Scoring Scores range from 0 to 120.  Minimally important difference is 5 units  CAT: CAT can identify the health impairment of COPD patients and is better correlated with disease progression.  CAT has a scoring range of zero to 40. The CAT score is classified into four groups of low (less than 10), medium (10 - 20), high (21-30) and very high (31-40) based on the impact level of disease on health status. A CAT score over 10 suggests significant symptoms.  A worsening CAT score could be explained by an exacerbation, poor medication adherence, poor inhaler technique, or progression of COPD or comorbid conditions.  CAT MCID is 2 points  mMRC: mMRC (Modified Medical Research Council) Dyspnea Scale is used to assess the degree of baseline functional disability in patients of respiratory disease due to dyspnea. No minimal important difference is established. A decrease in score of 1 point or greater is considered a positive change.   Pulmonary Function Assessment:   Exercise Target Goals: Exercise Program Goal: Individual exercise prescription set using results from initial 6 min walk test and THRR while considering  patient's activity barriers and safety.   Exercise Prescription Goal: Initial exercise prescription builds to 30-45 minutes a day of aerobic activity, 2-3 days per week.  Home exercise guidelines will be given to patient during program as part of exercise prescription that the participant will acknowledge.  Activity Barriers & Risk Stratification:  Activity Barriers & Cardiac Risk Stratification - 07/25/22 0847       Activity Barriers & Cardiac Risk Stratification   Activity Barriers Arthritis;Back Problems;Shortness of Breath    Cardiac Risk Stratification Moderate             6 Minute Walk:  6 Minute Walk     Row Name 07/25/22 0847          6 Minute Walk   Phase Initial     Distance 1600 feet     Walk Time 6 minutes     # of Rest Breaks 1     MPH 3.03     METS 4.01     RPE 12     Perceived Dyspnea  13     VO2 Peak 14.05     Symptoms Yes (comment)     Comments one standing 5 sec break to increase o2     Resting HR 82 bpm     Resting BP 108/62     Resting Oxygen Saturation  96 %     Exercise Oxygen Saturation  during 6 min walk 86 %     Max Ex. HR 120 bpm     Max Ex. BP 130/60     2 Minute Post BP 116/60       Interval HR   1 Minute HR 112     2 Minute HR 110     3 Minute HR 119     4 Minute HR 120     5 Minute HR 109     6 Minute HR 110     2 Minute Post HR 86     Interval Heart Rate? Yes       Interval Oxygen   Interval Oxygen? Yes     Baseline Oxygen Saturation % 96 %     1 Minute Oxygen Saturation % 87 %     1 Minute Liters of Oxygen 0 L     2 Minute Oxygen Saturation % 87 %     2 Minute Liters of Oxygen 0 L     3 Minute Oxygen Saturation % 86 %     3 Minute Liters of Oxygen 0 L     4 Minute Oxygen Saturation % 86 %     4 Minute Liters of Oxygen 0 L     5 Minute Oxygen Saturation % 87 %     5 Minute Liters of Oxygen 0 L     6 Minute Oxygen Saturation % 86 %     6 Minute Liters of Oxygen 0 L     2 Minute Post Oxygen Saturation % 95 %     2 Minute Post Liters of Oxygen 0 L              Oxygen Initial Assessment:  Oxygen Initial Assessment - 07/25/22 0824       Home Oxygen   Home Oxygen Device None    Sleep Oxygen Prescription None    Home Exercise Oxygen Prescription None    Home Resting Oxygen Prescription None    Compliance with Home Oxygen Use Yes      Intervention   Short Term Goals To learn and understand importance of monitoring SPO2 with pulse oximeter and demonstrate accurate use of the pulse oximeter.;To learn and understand importance of maintaining oxygen saturations>88%;To learn and demonstrate proper pursed lip breathing techniques or other breathing techniques.     Long   Term Goals Verbalizes importance of monitoring SPO2 with pulse oximeter and return demonstration;Maintenance of O2 saturations>88%;Exhibits proper breathing techniques, such as pursed lip breathing or other method taught during program session;Compliance with respiratory medication             Oxygen Re-Evaluation:   Oxygen Discharge (Final Oxygen Re-Evaluation):   Initial Exercise Prescription:  Initial Exercise Prescription - 07/25/22 0800       Date of Initial Exercise RX and Referring Provider   Date 07/25/22    Referring Provider Dr. Valeta Harms    Expected Discharge Date 11/09/22      Treadmill   MPH 1.8    Grade 0    Minutes 17      NuStep   Level 1  SPM 60    Minutes 22      Prescription Details   Frequency (times per week) 2    Duration Progress to 30 minutes of continuous aerobic without signs/symptoms of physical distress      Intensity   THRR 40-80% of Max Heartrate 62-124    Ratings of Perceived Exertion 11-13    Perceived Dyspnea 0-4      Resistance Training   Training Prescription Yes    Weight 4    Reps 10-15             Perform Capillary Blood Glucose checks as needed.  Exercise Prescription Changes:   Exercise Comments:   Exercise Goals and Review:   Exercise Goals     Row Name 07/25/22 0853             Exercise Goals   Increase Physical Activity Yes       Intervention Provide advice, education, support and counseling about physical activity/exercise needs.;Develop an individualized exercise prescription for aerobic and resistive training based on initial evaluation findings, risk stratification, comorbidities and participant's personal goals.       Expected Outcomes Short Term: Attend rehab on a regular basis to increase amount of physical activity.;Long Term: Exercising regularly at least 3-5 days a week.;Long Term: Add in home exercise to make exercise part of routine and to increase amount of physical activity.        Increase Strength and Stamina Yes       Intervention Provide advice, education, support and counseling about physical activity/exercise needs.;Develop an individualized exercise prescription for aerobic and resistive training based on initial evaluation findings, risk stratification, comorbidities and participant's personal goals.       Expected Outcomes Short Term: Increase workloads from initial exercise prescription for resistance, speed, and METs.;Short Term: Perform resistance training exercises routinely during rehab and add in resistance training at home;Long Term: Improve cardiorespiratory fitness, muscular endurance and strength as measured by increased METs and functional capacity (6MWT)       Able to understand and use rate of perceived exertion (RPE) scale Yes       Intervention Provide education and explanation on how to use RPE scale       Expected Outcomes Short Term: Able to use RPE daily in rehab to express subjective intensity level;Long Term:  Able to use RPE to guide intensity level when exercising independently       Able to understand and use Dyspnea scale Yes       Intervention Provide education and explanation on how to use Dyspnea scale       Expected Outcomes Short Term: Able to use Dyspnea scale daily in rehab to express subjective sense of shortness of breath during exertion;Long Term: Able to use Dyspnea scale to guide intensity level when exercising independently       Knowledge and understanding of Target Heart Rate Range (THRR) Yes       Intervention Provide education and explanation of THRR including how the numbers were predicted and where they are located for reference       Expected Outcomes Short Term: Able to state/look up THRR;Short Term: Able to use daily as guideline for intensity in rehab;Long Term: Able to use THRR to govern intensity when exercising independently       Understanding of Exercise Prescription Yes       Intervention Provide education,  explanation, and written materials on patient's individual exercise prescription       Expected  Outcomes Short Term: Able to explain program exercise prescription;Long Term: Able to explain home exercise prescription to exercise independently                Exercise Goals Re-Evaluation :   Discharge Exercise Prescription (Final Exercise Prescription Changes):   Nutrition:  Target Goals: Understanding of nutrition guidelines, daily intake of sodium 1500mg , cholesterol 200mg , calories 30% from fat and 7% or less from saturated fats, daily to have 5 or more servings of fruits and vegetables.  Biometrics:  Pre Biometrics - 07/25/22 0854       Pre Biometrics   Height 5\' 5"  (1.651 m)    Weight 146 lb 6.2 oz (66.4 kg)    Waist Circumference 34 inches    Hip Circumference 38 inches    Waist to Hip Ratio 0.89 %    BMI (Calculated) 24.36    Triceps Skinfold 20 mm    % Body Fat 34.8 %    Grip Strength 25.6 kg    Flexibility 0 in    Single Leg Stand 8.03 seconds              Nutrition Therapy Plan and Nutrition Goals:  Nutrition Therapy & Goals - 07/25/22 0853       Intervention Plan   Intervention Nutrition handout(s) given to patient.    Expected Outcomes Short Term Goal: Understand basic principles of dietary content, such as calories, fat, sodium, cholesterol and nutrients.             Nutrition Assessments:  Nutrition Assessments - 07/25/22 0853       MEDFICTS Scores   Pre Score 56            MEDIFICTS Score Key: ?70 Need to make dietary changes  40-70 Heart Healthy Diet ? 40 Therapeutic Level Cholesterol Diet   Picture Your Plate Scores: <81 Unhealthy dietary pattern with much room for improvement. 41-50 Dietary pattern unlikely to meet recommendations for good health and room for improvement. 51-60 More healthful dietary pattern, with some room for improvement.  >60 Healthy dietary pattern, although there may be some specific behaviors that  could be improved.    Nutrition Goals Re-Evaluation:   Nutrition Goals Discharge (Final Nutrition Goals Re-Evaluation):   Psychosocial: Target Goals: Acknowledge presence or absence of significant depression and/or stress, maximize coping skills, provide positive support system. Participant is able to verbalize types and ability to use techniques and skills needed for reducing stress and depression.  Initial Review & Psychosocial Screening:  Initial Psych Review & Screening - 07/25/22 0918       Initial Review   Current issues with Current Sleep Concerns;History of Depression      Family Dynamics   Good Support System? Yes    Comments Her support system includes her husband, children, step children, grandchildren      Barriers   Psychosocial barriers to participate in program There are no identifiable barriers or psychosocial needs.      Screening Interventions   Interventions Encouraged to exercise    Expected Outcomes Long Term goal: The participant improves quality of Life and PHQ9 Scores as seen by post scores and/or verbalization of changes;Short Term goal: Identification and review with participant of any Quality of Life or Depression concerns found by scoring the questionnaire.             Quality of Life Scores:  Quality of Life - 07/25/22 0855       Quality of Life   Select  Quality of Life      Quality of Life Scores   Health/Function Pre 28 %    Socioeconomic Pre 30 %    Psych/Spiritual Pre 30 %    Family Pre 28.8 %    GLOBAL Pre 28.97 %            Scores of 19 and below usually indicate a poorer quality of life in these areas.  A difference of  2-3 points is a clinically meaningful difference.  A difference of 2-3 points in the total score of the Quality of Life Index has been associated with significant improvement in overall quality of life, self-image, physical symptoms, and general health in studies assessing change in quality of  life.   PHQ-9: Review Flowsheet  More data exists      07/25/2022 06/15/2020 06/11/2020 03/31/2020 02/18/2020  Depression screen PHQ 2/9  Decreased Interest 0 0 0 1 1  Down, Depressed, Hopeless 0 0 0 1 0  PHQ - 2 Score 0 0 0 2 1  Altered sleeping 1 - - 0 1  Tired, decreased energy 0 - - 1 0  Change in appetite 0 - - 1 1  Feeling bad or failure about yourself  0 - - 1 0  Trouble concentrating 1 - - 1 0  Moving slowly or fidgety/restless 0 - - 1 0  Suicidal thoughts 0 - - 0 0  PHQ-9 Score 2 - - 7 3  Difficult doing work/chores Not difficult at all - - Somewhat difficult -   Interpretation of Total Score  Total Score Depression Severity:  1-4 = Minimal depression, 5-9 = Mild depression, 10-14 = Moderate depression, 15-19 = Moderately severe depression, 20-27 = Severe depression   Psychosocial Evaluation and Intervention:   Psychosocial Re-Evaluation:  Psychosocial Re-Evaluation     Row Name 07/25/22 0918             Psychosocial Re-Evaluation   Current issues with History of Depression;Current Sleep Concerns       Comments Pt has a history of depression and anxiety, and she also has current sleep concerns. She was diagnosed with lung cancer 12/2021 and received both chemotherapy and radiation treatment. She reports this was a hard time for her and she was prescribed xanax and took it for several months. She reports that she no longer is taking xanax and that she no longer feels like she has depression or anxiety. She takes hydroxyzine for insomnia. She reports that she has always had problems with her sleep. She has been referred for a sleep study to see if OSA plays a role in this, but she has not been called yet by the sleep center to be scheduled. She has no other identifiable psychosocial issues. She scored a 2 on her PHQ-9 and this is due to her sleep and her lack of concentration at times. She reports that she has a good support system with her husband, children, step children,  and grand children. 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 Her goals while in the program are to decrease her SOB with exertion. She is eager to start the program to get herself to "110%" so she can travel next year.       Expected Outcomes Pt's sleep concerns will continue to be treated and she will have no other identifiable psychosocial issues.       Interventions Encouraged to attend Pulmonary  Rehabilitation for the exercise       Continue Psychosocial Services  No Follow up required                Psychosocial Discharge (Final Psychosocial Re-Evaluation):  Psychosocial Re-Evaluation - 07/25/22 0918       Psychosocial Re-Evaluation   Current issues with History of Depression;Current Sleep Concerns    Comments Pt has a history of depression and anxiety, and she also has current sleep concerns. She was diagnosed with lung cancer 12/2021 and received both chemotherapy and radiation treatment. She reports this was a hard time for her and she was prescribed xanax and took it for several months. She reports that she no longer is taking xanax and that she no longer feels like she has depression or anxiety. She takes hydroxyzine for insomnia. She reports that she has always had problems with her sleep. She has been referred for a sleep study to see if OSA plays a role in this, but she has not been called yet by the sleep center to be scheduled. She has no other identifiable psychosocial issues. She scored a 2 on her PHQ-9 and this is due to her sleep and her lack of concentration at times. She reports that she has a good support system with her husband, children, step children, and grand children. 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 Her goals while in the program are to decrease her SOB with exertion. She is eager to  start the program to get herself to "110%" so she can travel next year.    Expected Outcomes Pt's sleep concerns will continue to be treated and she will have no other identifiable psychosocial issues.    Interventions Encouraged to attend Pulmonary Rehabilitation for the exercise    Continue Psychosocial Services  No Follow up required              Education: Education Goals: Education classes will be provided on a weekly basis, covering required topics. Participant will state understanding/return demonstration of topics presented.  Learning Barriers/Preferences:  Learning Barriers/Preferences - 07/25/22 0858       Learning Barriers/Preferences   Learning Barriers None    Learning Preferences Skilled Demonstration             Education Topics: How Lungs Work and Diseases: - Discuss the anatomy of the lungs and diseases that can affect the lungs, such as COPD.   Exercise: -Discuss the importance of exercise, FITT principles of exercise, normal and abnormal responses to exercise, and how to exercise safely.   Environmental Irritants: -Discuss types of environmental irritants and how to limit exposure to environmental irritants.   Meds/Inhalers and oxygen: - Discuss respiratory medications, definition of an inhaler and oxygen, and the proper way to use an inhaler and oxygen.   Energy Saving Techniques: - Discuss methods to conserve energy and decrease shortness of breath when performing activities of daily living.    Bronchial Hygiene / Breathing Techniques: - Discuss breathing mechanics, pursed-lip breathing technique,  proper posture, effective ways to clear airways, and other functional breathing techniques   Cleaning Equipment: - Provides group verbal and written instruction about the health risks of elevated stress, cause of high stress, and healthy ways to reduce stress.   Nutrition I: Fats: - Discuss the types of cholesterol, what cholesterol does to  the body, and how cholesterol levels can be controlled.   Nutrition II:  Labels: -Discuss the different components of food labels and how to read food labels.   Respiratory Infections: - Discuss the signs and symptoms of respiratory infections, ways to prevent respiratory infections, and the importance of seeking medical treatment when having a respiratory infection.   Stress I: Signs and Symptoms: - Discuss the causes of stress, how stress may lead to anxiety and depression, and ways to limit stress.   Stress II: Relaxation: -Discuss relaxation techniques to limit stress.   Oxygen for Home/Travel: - Discuss how to prepare for travel when on oxygen and proper ways to transport and store oxygen to ensure safety.   Knowledge Questionnaire Score:  Knowledge Questionnaire Score - 07/25/22 0826       Knowledge Questionnaire Score   Pre Score 5/18             Core Components/Risk Factors/Patient Goals at Admission:  Personal Goals and Risk Factors at Admission - 07/25/22 0908       Core Components/Risk Factors/Patient Goals on Admission   Improve shortness of breath with ADL's Yes    Intervention Provide education, individualized exercise plan and daily activity instruction to help decrease symptoms of SOB with activities of daily living.    Expected Outcomes Short Term: Improve cardiorespiratory fitness to achieve a reduction of symptoms when performing ADLs;Long Term: Be able to perform more ADLs without symptoms or delay the onset of symptoms             Core Components/Risk Factors/Patient Goals Review:    Core Components/Risk Factors/Patient Goals at Discharge (Final Review):    ITP Comments:   Comments: Patient arrived for 1st visit/orientation/education at 0800. Patient was referred to PR by Dr. Valeta Harms due to COPD, unspecified COPD type (J44.9) and Bronchiectasis, uncomplicated (M35.5). During orientation advised patient on arrival and appointment times what  to wear, what to do before, during and after exercise. Reviewed attendance and class policy.  Pt is scheduled to return Pulmonary Rehab on 07/27/2022 at 1045. Pt was advised to come to class 15 minutes before class starts.  Discussed RPE/Dpysnea scales. Patient participated in warm up stretches. Patient was able to complete 6 minute walk test. Patient was measured for the equipment. Discussed equipment safety with patient. Took patient pre-anthropometric measurements. Patient finished visit at 0910.

## 2022-07-26 ENCOUNTER — Encounter (HOSPITAL_COMMUNITY): Payer: 59

## 2022-07-27 ENCOUNTER — Encounter (HOSPITAL_COMMUNITY)
Admission: RE | Admit: 2022-07-27 | Discharge: 2022-07-27 | Disposition: A | Discharge status: Home / Self Care | Payer: 59 | Source: Ambulatory Visit | Attending: Pulmonary Disease | Admitting: Pulmonary Disease

## 2022-07-27 DIAGNOSIS — J479 Bronchiectasis, uncomplicated: Secondary | ICD-10-CM

## 2022-07-27 DIAGNOSIS — J449 Chronic obstructive pulmonary disease, unspecified: Secondary | ICD-10-CM

## 2022-07-27 NOTE — Progress Notes (Signed)
Daily Session Note  Patient Details  Name: Diane [name] MRN: [id number] Date of Birth: 07/16/1956 Referring Provider:   Flowsheet Row PULMONARY REHAB COPD ORIENTATION from 07/25/2022 in [place]  Referring Provider Dr. Valeta Harms       Encounter Date: 07/27/2022  Check In:  Session Check In - 07/27/22 1045       Check-In   Supervising physician immediately available to respond to emergencies [facility] MD immediately available    Physician(s) Dr. Gardiner Rhyme    Location [facility]    Staff Present Redge Gainer, BS, Exercise Physiologist;Daphyne Hassell Done, RN, BSN;Other    Virtual Visit No    Medication changes reported     No    Fall or balance concerns reported    No    Tobacco Cessation No Change    Warm-up and Cool-down Performed as group-led instruction    Resistance Training Performed Yes    VAD Patient? No    PAD/SET Patient? No      Pain Assessment   Currently in Pain? No/denies    Multiple Pain Sites No             Capillary Blood Glucose: No results found for this or any previous visit (from the past 24 hour(s)).    Social History   Tobacco Use  Smoking Status Former   Packs/day: 0.50   Years: 30.00   Total pack years: 15.00   Types: Cigarettes   Quit date: 10/09/2021   Years since quitting: 0.7  Smokeless Tobacco Never  Tobacco Comments   5-10 cigarettes a day (plans to quit prior to this surgery 05/06/15)    Goals Met:  Proper associated with RPD/PD & O2 Sat Independence with exercise equipment Using PLB without cueing & demonstrates good technique Exercise tolerated well Queuing for purse lip breathing No report of concerns or symptoms today Strength training completed today  Goals Unmet:  Not Applicable  Comments: check out at 11:45   Dr. Kathie Dike is Medical Director for [facility].

## 2022-08-01 ENCOUNTER — Encounter (HOSPITAL_COMMUNITY): Payer: 59

## 2022-08-03 ENCOUNTER — Encounter (HOSPITAL_COMMUNITY)
Admission: RE | Admit: 2022-08-03 | Discharge: 2022-08-03 | Disposition: A | Discharge status: Home / Self Care | Payer: 59 | Source: Ambulatory Visit | Attending: Pulmonary Disease | Admitting: Pulmonary Disease

## 2022-08-03 DIAGNOSIS — J449 Chronic obstructive pulmonary disease, unspecified: Secondary | ICD-10-CM

## 2022-08-03 DIAGNOSIS — J479 Bronchiectasis, uncomplicated: Secondary | ICD-10-CM

## 2022-08-03 NOTE — Progress Notes (Signed)
Daily Session Note  Patient Details  Name: Izzy Doubek MRN: [id number] Date of Birth: 07/21/1956 Referring Provider:   Flowsheet Row PULMONARY REHAB COPD ORIENTATION from 07/25/2022 in [place]  Referring Provider Dr. Valeta Harms       Encounter Date: 08/03/2022  Check In:  Session Check In - 08/03/22 1045       Check-In   Supervising physician immediately available to respond to emergencies [facility] MD immediately available    Physician(s) Dr. Dellia Cloud    Location [facility]    Staff Present Redge Gainer, BS, Exercise Physiologist;Dalton Kris Mouton, MS, ACSM-CEP, Exercise Physiologist;Other    Virtual Visit No    Medication changes reported     No    Fall or balance concerns reported    No    Tobacco Cessation No Change    Warm-up and Cool-down Performed as group-led instruction    Resistance Training Performed Yes    VAD Patient? No    PAD/SET Patient? No      Pain Assessment   Currently in Pain? No/denies    Multiple Pain Sites No             Capillary Blood Glucose: No results found for this or any previous visit (from the past 24 hour(s)).    Social History   Tobacco Use  Smoking Status Former   Packs/day: 0.50   Years: 30.00   Total pack years: 15.00   Types: Cigarettes   Quit date: 10/09/2021   Years since quitting: 0.8  Smokeless Tobacco Never  Tobacco Comments   5-10 cigarettes a day (plans to quit prior to this surgery 05/06/15)    Goals Met:  Proper associated with RPD/PD & O2 Sat Independence with exercise equipment Using PLB without cueing & demonstrates good technique Exercise tolerated well Queuing for purse lip breathing No report of concerns or symptoms today Strength training completed today  Goals Unmet:  Not Applicable  Comments: check out at 11:45   Dr. Kathie Dike is Medical Director for [facility] Pul[facility] Re[facility].

## 2022-08-08 ENCOUNTER — Encounter: Payer: Self-pay | Admitting: Acute Care

## 2022-08-08 ENCOUNTER — Encounter (HOSPITAL_COMMUNITY): Payer: 59

## 2022-08-08 ENCOUNTER — Ambulatory Visit (INDEPENDENT_AMBULATORY_CARE_PROVIDER_SITE_OTHER): Payer: 59 | Admitting: Acute Care

## 2022-08-08 VITALS — BP 114/74 | HR 91 | Ht 65.0 in | Wt 147.0 lb

## 2022-08-08 DIAGNOSIS — R0609 Other forms of dyspnea: Secondary | ICD-10-CM

## 2022-08-08 DIAGNOSIS — Z85118 Personal history of other malignant neoplasm of bronchus and lung: Secondary | ICD-10-CM | POA: Diagnosis not present

## 2022-08-08 DIAGNOSIS — Z23 Encounter for immunization: Secondary | ICD-10-CM | POA: Diagnosis not present

## 2022-08-08 NOTE — Progress Notes (Unsigned)
History of Present Illness Diane [name] is a 66 y.o. female with ***   08/08/2022 Pt. Presents for follow up. She took both the Doxycycline and prednisone. She states secretions have cleared. She does still have desaturations. We walked her today in the clinic as we had a message from Pulmonary Rehab, and she did drop to 85% on RA with exertion. This corrected with 2 L of oxygen. Plan is to order oxygen for exertion. She will wear this with exercise at Pulmonary rehab.  She is still waiting for her home sleep study to be scheduled.  She will have her HRCT in December as surveillance and to evaluate for fibrosis.  Sounds better Will wear her oxygen at 2 L  Home Sleep test pending  Test Results:     Latest Ref Rng & Units 06/27/2022   10:08 AM 03/23/2022   10:11 AM 02/27/2022    9:12 AM  CBC  WBC 4.0 - 10.5 K/uL 6.0  5.5  2.2   Hemoglobin 12.0 - 15.0 g/dL 13.4  12.0  11.5   Hematocrit 36.0 - 46.0 % 39.4  35.3  33.6   Platelets 150 - 400 K/uL 303  271  242        Latest Ref Rng & Units 06/27/2022   10:08 AM 03/23/2022   10:11 AM 02/27/2022    9:12 AM  BMP  Glucose 70 - 99 mg/dL 111  97  102   BUN 8 - 23 mg/dL 16  10  10    Creatinine 0.44 - 1.00 mg/dL 0.76  0.61  0.84   Sodium 135 - 145 mmol/L 140  137  132   Potassium 3.5 - 5.1 mmol/L 4.1  4.1  4.0   Chloride 98 - 111 mmol/L 102  100  98   CO2 22 - 32 mmol/L 31  29  26    Calcium 8.9 - 10.3 mg/dL 10.3  9.8  9.6     BNP No results found for: "BNP"  ProBNP No results found for: "PROBNP"  PFT    Component Value Date/Time   FEV1PRE 1.84 01/04/2022 0951   FEV1POST 1.91 01/04/2022 0951   FVCPRE 2.54 01/04/2022 0951   FVCPOST 2.69 01/04/2022 0951   TLC 3.94 01/04/2022 0951   DLCOUNC 9.15 01/04/2022 0951   PREFEV1FVCRT 73 01/04/2022 0951   PSTFEV1FVCRT 71 01/04/2022 0951    No results found.   Past medical hx Past Medical History:  Diagnosis Date   Anxiety    Arthritis    fingers, left foot   C. difficile  diarrhea 2015   COPD (chronic obstructive pulmonary disease) (HCC)    Depression    Eczema    H pylori ulcer    Headache(784.0)    Hypercholesterolemia    Hypertension    Kidney stones    20 years ago   Pneumonia    5 years ago   Seizures ([place])    2 years ago, "cluster of seizures" none since   Shingles      Social History   Tobacco Use   Smoking status: Former    Packs/day: 0.50    Years: 30.00    Total pack years: 15.00    Types: Cigarettes    Quit date: 10/09/2021    Years since quitting: 0.8   Smokeless tobacco: Never   Tobacco comments:    5-10 cigarettes a day (plans to quit prior to this surgery 05/06/15)  Vaping Use   Vaping Use: Some days  Substance  Use Topics   Alcohol use: Yes    Alcohol/week: 21.0 standard drinks of alcohol    Types: 21 Shots of liquor per week    Comment: 3 per day   Drug use: No    Ms.Johns reports that she quit smoking about 9 months ago. Her smoking use included cigarettes. She has a 15.00 pack-year smoking history. She has never used smokeless tobacco. She reports current alcohol use of about 21.0 standard drinks of alcohol per week. She reports that she does not use drugs.  Tobacco Cessation: Counseling given: Not Answered Tobacco comments: 5-10 cigarettes a day (plans to quit prior to this surgery 05/06/15)   Past surgical hx, Family hx, Social hx all reviewed.  Current Outpatient Medications on File Prior to Visit  Medication Sig   cetirizine (ZYRTEC) 10 MG tablet Take 10 mg by mouth at bedtime.   doxycycline (VIBRA-TABS) 100 MG tablet Take 1 tablet (100 mg total) by mouth 2 (two) times daily.   EPINEPHrine (AUVI-Q) 0.3 mg/0.3 mL IJ SOAJ injection Inject 0.3 mLs (0.3 mg total) into the muscle as needed for anaphylaxis.   fluticasone (FLONASE) 50 MCG/ACT nasal spray Place 2 sprays into both nostrils daily. (Patient taking differently: Place 2 sprays into both nostrils daily as needed for allergies.)   hydrochlorothiazide  (MICROZIDE) 12.5 MG capsule Take 12.5 mg by mouth daily.   hydrOXYzine (ATARAX) 25 MG tablet Take 25 mg by mouth daily.   omeprazole (PRILOSEC) 20 MG capsule Take 1 capsule (20 mg total) by mouth 2 (two) times daily. (Needs to be seen before next refill)   OVER THE COUNTER MEDICATION Take 1 capsule by mouth daily. Probiotic   predniSONE (DELTASONE) 10 MG tablet Prednisone taper; 10 mg tablets: 4 tabs x 2 days, 3 tabs x 2 days, 2 tabs x 2 days 1 tab x 2 days then stop.   rosuvastatin (CRESTOR) 10 MG tablet Take 10 mg by mouth at bedtime.   umeclidinium-vilanterol (ANORO ELLIPTA) 62.5-25 MCG/ACT AEPB Inhale 1 puff into the lungs daily.   ALPRAZolam (XANAX) 0.5 MG tablet Take 0.5 mg by mouth daily as needed. (Patient not taking: Reported on 08/08/2022)   No current facility-administered medications on file prior to visit.     Allergies  Allergen Reactions   Alpha-Gal     Other reaction(s): Abdominal Pain   Other Other (See Comments)   Penicillins     Unknown type of reaction   Prozac [Fluoxetine] Rash    Review Of Systems:  Constitutional:   No  weight loss, night sweats,  Fevers, chills, fatigue, or  lassitude.  HEENT:   No headaches,  Difficulty swallowing,  Tooth/dental problems, or  Sore throat,                No sneezing, itching, ear ache, nasal congestion, post nasal drip,   CV:  No chest pain,  Orthopnea, PND, swelling in lower extremities, anasarca, dizziness, palpitations, syncope.   GI  No heartburn, indigestion, abdominal pain, nausea, vomiting, diarrhea, change in bowel habits, loss of appetite, bloody stools.   Resp: No shortness of breath with exertion or at rest.  No excess mucus, no productive cough,  No non-productive cough,  No coughing up of blood.  No change in color of mucus.  No wheezing.  No chest wall deformity  Skin: no rash or lesions.  GU: no dysuria, change in color of urine, no urgency or frequency.  No flank pain, no hematuria   MS:  No joint  pain or  swelling.  No decreased range of motion.  No back pain.  Psych:  No change in mood or affect. No depression or anxiety.  No memory loss.   Vital Signs BP 114/74 (BP Location: Left Arm, Cuff Size: Normal)   Pulse 91   Ht 5\' 5"  (1.651 m)   Wt 147 lb (66.7 kg)   SpO2 95%   BMI 24.46 kg/m    Physical Exam:  General- No distress,  A&Ox3 ENT: No sinus tenderness, TM clear, pale nasal mucosa, no oral exudate,no post nasal drip, no LAN Cardiac: S1, S2, regular rate and rhythm, no murmur Chest: No wheeze/ rales/ dullness; no accessory muscle use, no nasal flaring, no sternal retractions Abd.: Soft Non-tender Ext: No clubbing cyanosis, edema Neuro:  normal strength Skin: No rashes, warm and dry Psych: normal mood and behavior   Assessment/Plan  No problem-specific Assessment & Plan notes found for this encounter.    Magdalen Spatz, NP 08/08/2022  11:49 AM

## 2022-08-08 NOTE — Patient Instructions (Addendum)
It is good to see you today We will order your oxygen  Please wear at 2 L [place] with any exertion.  We will do the HRCT in December as is scheduled.  We will do a follow up after to evaluate results Follow up with Judson Roch NP week of 09/26/2022. We will consider repeat PFT's in the future We will follow up on home sleep study Please contact office for sooner follow up if symptoms do not improve or worsen or seek emergency care

## 2022-08-09 ENCOUNTER — Encounter: Payer: Self-pay | Admitting: Acute Care

## 2022-08-09 NOTE — Telephone Encounter (Signed)
I can fax the order to [place] but they will need Sarah's office note which is still open

## 2022-08-10 ENCOUNTER — Other Ambulatory Visit: Payer: Self-pay | Admitting: Acute Care

## 2022-08-10 ENCOUNTER — Encounter (HOSPITAL_COMMUNITY)
Admission: RE | Admit: 2022-08-10 | Discharge: 2022-08-10 | Disposition: A | Discharge status: Home / Self Care | Payer: 59 | Source: Ambulatory Visit | Attending: Pulmonary Disease | Admitting: Pulmonary Disease

## 2022-08-10 ENCOUNTER — Telehealth: Payer: Self-pay | Admitting: Acute Care

## 2022-08-10 ENCOUNTER — Other Ambulatory Visit (HOSPITAL_COMMUNITY)
Admission: RE | Admit: 2022-08-10 | Discharge: 2022-08-10 | Disposition: A | Discharge status: Home / Self Care | Payer: 59 | Source: Ambulatory Visit | Attending: Acute Care | Admitting: Acute Care

## 2022-08-10 DIAGNOSIS — J449 Chronic obstructive pulmonary disease, unspecified: Secondary | ICD-10-CM

## 2022-08-10 DIAGNOSIS — R0600 Dyspnea, unspecified: Secondary | ICD-10-CM

## 2022-08-10 DIAGNOSIS — J479 Bronchiectasis, uncomplicated: Secondary | ICD-10-CM

## 2022-08-10 LAB — D-DIMER, QUANTITATIVE: D-Dimer, Quant: 0.42 ug/mL-FEU (ref 0.00–0.50)

## 2022-08-10 NOTE — Progress Notes (Signed)
I have called the patient and asked her to go to [facility] for a d dimer draw to RO PE as possibility for dyspnea, despite Well's Criteria being low probability. She has had cancer and Covid recently, so I want to Rule this out.She is going to [facility] today. They can provide her oxygen there.  Additionally, she has not yet received her portable oxygen that was ordered on 10/24. She needs 2 L with exertion. The order was sent to [facility]. They no longer provide portable units. The patient was very upset , and I have asked our PCC's to send the order to [facility], for delivery asap. [facility] has spoken with Lenna Sciara at [place], and they will call the patient for oxygen delivery.  Pt has verbalized understanding of above plans for labwork and for oxygen .

## 2022-08-10 NOTE — Telephone Encounter (Signed)
-----   Message from Magdalen Spatz, NP sent at 08/09/2022  5:47 PM EDT ----- [facility], will you please make sure patient is in line to get a home sleep study done? I wrote the order awhile ago, and just want to make sure they have her in the que. Thanks

## 2022-08-10 NOTE — Telephone Encounter (Signed)
It appears that the [place] [facility] does not have this portable oxygen concentrator. Please advise if ok to place a new order?   Thanks

## 2022-08-10 NOTE — Telephone Encounter (Signed)
The referral and Lucita Ferrara notes has been faxed to [facility]

## 2022-08-10 NOTE — Telephone Encounter (Signed)
Called patient and she states that her POC is already being addressed and taken care of. I told her ok and she states they were coming out today to give her the POC. Nothing further needed

## 2022-08-10 NOTE — Telephone Encounter (Signed)
Diane [name] wants to make sure she is still in the que for a home sleep test. Thanks

## 2022-08-10 NOTE — Progress Notes (Signed)
Daily Session Note  Patient Details  Name: Diane [name] MRN: [id number] Date of Birth: 1955-11-25 Referring Provider:   Flowsheet Row PULMONARY REHAB COPD ORIENTATION from 07/25/2022 in [place]  Referring Provider Dr. Valeta Harms       Encounter Date: 08/10/2022  Check In:  Session Check In - 08/10/22 1334       Check-In   Supervising physician immediately available to respond to emergencies [facility] MD immediately available    Physician(s) Dr. Harl Bowie    Location [facility]    Staff Present Leana Roe, BS, Exercise Physiologist;Alphus Zeck BSN, RN;Dalton Sherrie George, MS, ACSM-CEP    Virtual Visit No    Medication changes reported     No    Fall or balance concerns reported    No    Tobacco Cessation No Change    Warm-up and Cool-down Performed as group-led instruction    Resistance Training Performed Yes    VAD Patient? No    PAD/SET Patient? No      Pain Assessment   Currently in Pain? No/denies    Multiple Pain Sites No             Capillary Blood Glucose: No results found for this or any previous visit (from the past 24 hour(s)).    Social History   Tobacco Use  Smoking Status Former   Packs/day: 0.50   Years: 30.00   Total pack years: 15.00   Types: Cigarettes   Quit date: 10/09/2021   Years since quitting: 0.8  Smokeless Tobacco Never  Tobacco Comments   5-10 cigarettes a day (plans to quit prior to this surgery 05/06/15)    Goals Met:  Proper associated with RPD/PD & O2 Sat Independence with exercise equipment Using PLB without cueing & demonstrates good technique Exercise tolerated well Queuing for purse lip breathing No report of concerns or symptoms today Strength training completed today  Goals Unmet:  Not Applicable  Comments: check out at 2:45   Dr. Kathie Dike is Medical Director for [facility] Pulmonary Rehab.

## 2022-08-10 NOTE — Telephone Encounter (Signed)
Yes we are still 12 weeks out for HST

## 2022-08-11 ENCOUNTER — Telehealth: Payer: Self-pay | Admitting: Acute Care

## 2022-08-11 ENCOUNTER — Ambulatory Visit: Payer: 59 | Admitting: Acute Care

## 2022-08-11 NOTE — Telephone Encounter (Signed)
I have called the patient with her d-dimer results. They were WNL. She does not need further work up for PE. She has had her oxygen delivered. It is not working. She is calling her DME to get her POC evaluated.

## 2022-08-15 ENCOUNTER — Encounter (HOSPITAL_COMMUNITY)
Admission: RE | Admit: 2022-08-15 | Discharge: 2022-08-15 | Disposition: A | Discharge status: Home / Self Care | Payer: 59 | Source: Ambulatory Visit | Attending: Pulmonary Disease | Admitting: Pulmonary Disease

## 2022-08-15 DIAGNOSIS — J449 Chronic obstructive pulmonary disease, unspecified: Secondary | ICD-10-CM | POA: Diagnosis not present

## 2022-08-15 DIAGNOSIS — J479 Bronchiectasis, uncomplicated: Secondary | ICD-10-CM

## 2022-08-15 NOTE — Progress Notes (Signed)
Daily Session Note  Patient Details  Name: Giannamarie Fayson MRN: [id number] Date of Birth: 03/30/1956 Referring Provider:   Flowsheet Row PULMONARY REHAB COPD ORIENTATION from 07/25/2022 in [place] [facility]  Referring Provider Dr. Icard       Encounter Date: 08/15/2022  Check In:  Session Check In - 08/15/22 1045       Check-In   Supervising physician immediately available to respond to emergencies [facility] MD immediately available    Physician(s) Dr McDowell    Location [facility]    Staff Present Heather Bailey, BS, Exercise Physiologist; , RN, BSN    Virtual Visit No    Medication changes reported     No    Fall or balance concerns reported    No    Tobacco Cessation No Change    Warm-up and Cool-down Performed as group-led instruction    Resistance Training Performed Yes    VAD Patient? No    PAD/SET Patient? No      Pain Assessment   Currently in Pain? No/denies    Multiple Pain Sites No             Capillary Blood Glucose: No results found for this or any previous visit (from the past 24 hour(s)).    Social History   Tobacco Use  Smoking Status Former   Packs/day: 0.50   Years: 30.00   Total pack years: 15.00   Types: Cigarettes   Quit date: 10/09/2021   Years since quitting: 0.8  Smokeless Tobacco Never  Tobacco Comments   5-10 cigarettes a day (plans to quit prior to this surgery 05/06/15)    Goals Met:  Proper associated with RPD/PD & O2 Sat Independence with exercise equipment Using PLB without cueing & demonstrates good technique Exercise tolerated well Queuing for purse lip breathing No report of concerns or symptoms today Strength training completed today  Goals Unmet:  Not Applicable  Comments: checkout at 1145.   Dr. Jehanzeb Memon is Medical Director for [facility]. 

## 2022-08-17 ENCOUNTER — Encounter (HOSPITAL_COMMUNITY)
Admission: RE | Admit: 2022-08-17 | Discharge: 2022-08-17 | Disposition: A | Discharge status: Home / Self Care | Payer: 59 | Source: Ambulatory Visit | Attending: Pulmonary Disease | Admitting: Pulmonary Disease

## 2022-08-17 DIAGNOSIS — J479 Bronchiectasis, uncomplicated: Secondary | ICD-10-CM | POA: Insufficient documentation

## 2022-08-17 DIAGNOSIS — J449 Chronic obstructive pulmonary disease, unspecified: Secondary | ICD-10-CM | POA: Diagnosis not present

## 2022-08-17 NOTE — Progress Notes (Signed)
Daily Session Note  Patient Details  Name: Diane [name] MRN: [id number] Date of Birth: 1956-08-14 Referring Provider:   Flowsheet Row PULMONARY REHAB COPD ORIENTATION from 07/25/2022 in [place]  Referring Provider Dr. Valeta Harms       Encounter Date: 08/17/2022  Check In:  Session Check In - 08/17/22 1045       Check-In   Supervising physician immediately available to respond to emergencies [facility] MD immediately available    Physician(s) Dr. Domenic Polite    Location [facility]    Staff Present Leana Roe, BS, Exercise Physiologist;Jai Bear BSN, RN;Dalton Sherrie George, MS, ACSM-CEP    Virtual Visit No    Medication changes reported     No    Fall or balance concerns reported    No    Tobacco Cessation No Change    Warm-up and Cool-down Performed as group-led instruction    Resistance Training Performed Yes    VAD Patient? No    PAD/SET Patient? No      Pain Assessment   Currently in Pain? No/denies    Multiple Pain Sites No             Capillary Blood Glucose: No results found for this or any previous visit (from the past 24 hour(s)).    Social History   Tobacco Use  Smoking Status Former   Packs/day: 0.50   Years: 30.00   Total pack years: 15.00   Types: Cigarettes   Quit date: 10/09/2021   Years since quitting: 0.8  Smokeless Tobacco Never  Tobacco Comments   5-10 cigarettes a day (plans to quit prior to this surgery 05/06/15)    Goals Met:  Proper associated with RPD/PD & O2 Sat Independence with exercise equipment Using PLB without cueing & demonstrates good technique Exercise tolerated well Queuing for purse lip breathing No report of concerns or symptoms today Strength training completed today  Goals Unmet:  Not Applicable  Comments: check out at 11:45   Dr. Kathie Dike is Medical Director for [facility] Pulmonary Rehab.

## 2022-08-22 ENCOUNTER — Encounter (HOSPITAL_COMMUNITY)
Admission: RE | Admit: 2022-08-22 | Discharge: 2022-08-22 | Disposition: A | Discharge status: Home / Self Care | Payer: 59 | Source: Ambulatory Visit | Attending: Pulmonary Disease | Admitting: Pulmonary Disease

## 2022-08-22 VITALS — Wt 146.6 lb

## 2022-08-22 DIAGNOSIS — J449 Chronic obstructive pulmonary disease, unspecified: Secondary | ICD-10-CM

## 2022-08-22 DIAGNOSIS — J479 Bronchiectasis, uncomplicated: Secondary | ICD-10-CM

## 2022-08-22 NOTE — Progress Notes (Signed)
Daily Session Note  Patient Details  Name: Diane [name] MRN: [id number] Date of Birth: 04-12-1956 Referring Provider:   Flowsheet Row PULMONARY REHAB COPD ORIENTATION from 07/25/2022 in [place]  Referring Provider Dr. Valeta Harms       Encounter Date: 08/22/2022  Check In:  Session Check In - 08/22/22 1045       Check-In   Supervising physician immediately available to respond to emergencies [facility] MD immediately available    Physician(s) Dr. Dellia Cloud    Location [facility]    Staff Present Leana Roe, BS, Exercise Physiologist;Alesi Zachery Sherrie George, MS, ACSM-CEP    Virtual Visit No    Medication changes reported     No    Fall or balance concerns reported    No    Tobacco Cessation No Change    Warm-up and Cool-down Performed as group-led instruction    Resistance Training Performed Yes    VAD Patient? No    PAD/SET Patient? No      Pain Assessment   Currently in Pain? No/denies    Multiple Pain Sites No             Capillary Blood Glucose: No results found for this or any previous visit (from the past 24 hour(s)).    Social History   Tobacco Use  Smoking Status Former   Packs/day: 0.50   Years: 30.00   Total pack years: 15.00   Types: Cigarettes   Quit date: 10/09/2021   Years since quitting: 0.8  Smokeless Tobacco Never  Tobacco Comments   5-10 cigarettes a day (plans to quit prior to this surgery 05/06/15)    Goals Met:  Proper associated with RPD/PD & O2 Sat Independence with exercise equipment Using PLB without cueing & demonstrates good technique Exercise tolerated well Queuing for purse lip breathing No report of concerns or symptoms today Strength training completed today  Goals Unmet:  Not Applicable  Comments: checkout time is 1145   Dr. Kathie Dike is Medical Director for [facility] Pulmonary Rehab.

## 2022-08-23 NOTE — Progress Notes (Signed)
Pulmonary Individual Treatment Plan  Patient Details  Name: Jenniger Figiel MRN: [id number] Date of Birth: 03/22/1956 Referring Provider:   Flowsheet Row PULMONARY REHAB COPD ORIENTATION from 07/25/2022 in [place]  Referring Provider Dr. Valeta Harms       Initial Encounter Date:  Flowsheet Row PULMONARY REHAB COPD ORIENTATION from 07/25/2022 in [facility]  Date 07/25/22       Visit Diagnosis: Chronic obstructive pulmonary disease, unspecified COPD type ([place])  Bronchiectasis, uncomplicated ([place])  Patient's Home Medications on Admission:   Current Outpatient Medications:    ALPRAZolam (XANAX) 0.5 MG tablet, Take 0.5 mg by mouth daily as needed. (Patient not taking: Reported on 08/08/2022), Disp: , Rfl:    cetirizine (ZYRTEC) 10 MG tablet, Take 10 mg by mouth at bedtime., Disp: , Rfl:    doxycycline (VIBRA-TABS) 100 MG tablet, Take 1 tablet (100 mg total) by mouth 2 (two) times daily., Disp: 14 tablet, Rfl: 0   EPINEPHrine (AUVI-Q) 0.3 mg/0.3 mL IJ SOAJ injection, Inject 0.3 mLs (0.3 mg total) into the muscle as needed for anaphylaxis., Disp: 2 each, Rfl: 1   fluticasone (FLONASE) 50 MCG/ACT nasal spray, Place 2 sprays into both nostrils daily. (Patient taking differently: Place 2 sprays into both nostrils daily as needed for allergies.), Disp: 16 g, Rfl: 5   hydrochlorothiazide (MICROZIDE) 12.5 MG capsule, Take 12.5 mg by mouth daily., Disp: , Rfl:    hydrOXYzine (ATARAX) 25 MG tablet, Take 25 mg by mouth daily., Disp: , Rfl:    omeprazole (PRILOSEC) 20 MG capsule, Take 1 capsule (20 mg total) by mouth 2 (two) times daily. (Needs to be seen before next refill), Disp: 60 capsule, Rfl: 0   OVER THE COUNTER MEDICATION, Take 1 capsule by mouth daily. Probiotic, Disp: , Rfl:    predniSONE (DELTASONE) 10 MG tablet, Prednisone taper; 10 mg tablets: 4 tabs x 2 days, 3 tabs x 2 days, 2 tabs x 2 days 1 tab x 2 days then stop., Disp: 20 tablet,  Rfl: 0   rosuvastatin (CRESTOR) 10 MG tablet, Take 10 mg by mouth at bedtime., Disp: , Rfl:    umeclidinium-vilanterol (ANORO ELLIPTA) 62.5-25 MCG/ACT AEPB, Inhale 1 puff into the lungs daily., Disp: 28 each, Rfl: 3  Past Medical History: Past Medical History:  Diagnosis Date   Anxiety    Arthritis    fingers, left foot   C. difficile diarrhea 2015   COPD (chronic obstructive pulmonary disease) (HCC)    Depression    Eczema    H pylori ulcer    Headache(784.0)    Hypercholesterolemia    Hypertension    Kidney stones    20 years ago   Pneumonia    5 years ago   Seizures ([place])    2 years ago, "cluster of seizures" none since   Shingles     Tobacco Use: Social History   Tobacco Use  Smoking Status Former   Packs/day: 0.50   Years: 30.00   Total pack years: 15.00   Types: Cigarettes   Quit date: 10/09/2021   Years since quitting: 0.8  Smokeless Tobacco Never  Tobacco Comments   5-10 cigarettes a day (plans to quit prior to this surgery 05/06/15)    Labs: Review Flowsheet  More data exists      Latest Ref Rng & Units 01/11/2018 12/03/2019 01/06/2021 02/17/2021 10/14/2021  Labs for ITP Cardiac and Pulmonary Rehab  Cholestrol <200 mg/dL 221  273  232  258  173  LDL (calc) mg/dL (calc) 125  164  152  169  95   HDL-C > OR = 50 mg/dL 76  62  55  63  55   Trlycerides <150 mg/dL 100  255  123  129  129   Hemoglobin A1c <7.0 % 5.1  - - - -    Capillary Blood Glucose: Lab Results  Component Value Date   GLUCAP 100 (H) 12/13/2021     Pulmonary Assessment Scores:  Pulmonary Assessment Scores     Row Name 07/25/22 0824         ADL UCSD   SOB Score total 11     Rest 0     Walk 0     Stairs 1     Bath 0     Dress 0     Shop 0       CAT Score   CAT Score 21       mMRC Score   mMRC Score 1             UCSD: Self-administered rating of dyspnea associated with activities of daily living (ADLs) 6-point scale (0 = "not at all" to 5 = "maximal or unable to  do because of breathlessness")  Scoring Scores range from 0 to 120.  Minimally important difference is 5 units  CAT: CAT can identify the health impairment of COPD patients and is better correlated with disease progression.  CAT has a scoring range of zero to 40. The CAT score is classified into four groups of low (less than 10), medium (10 - 20), high (21-30) and very high (31-40) based on the impact level of disease on health status. A CAT score over 10 suggests significant symptoms.  A worsening CAT score could be explained by an exacerbation, poor medication adherence, poor inhaler technique, or progression of COPD or comorbid conditions.  CAT MCID is 2 points  mMRC: mMRC (Modified Medical Research Council) Dyspnea Scale is used to assess the degree of baseline functional disability in patients of respiratory disease due to dyspnea. No minimal important difference is established. A decrease in score of 1 point or greater is considered a positive change.   Pulmonary Function Assessment:   Exercise Target Goals: Exercise Program Goal: Individual exercise prescription set using results from initial 6 min walk test and THRR while considering  patient's activity barriers and safety.   Exercise Prescription Goal: Initial exercise prescription builds to 30-45 minutes a day of aerobic activity, 2-3 days per week.  Home exercise guidelines will be given to patient during program as part of exercise prescription that the participant will acknowledge.  Activity Barriers & Risk Stratification:  Activity Barriers & Cardiac Risk Stratification - 07/25/22 0847       Activity Barriers & Cardiac Risk Stratification   Activity Barriers Arthritis;Back Problems;Shortness of Breath    Cardiac Risk Stratification Moderate             6 Minute Walk:  6 Minute Walk     Row Name 07/25/22 0847         6 Minute Walk   Phase Initial     Distance 1600 feet     Walk Time 6 minutes     # of Rest  Breaks 1     MPH 3.03     METS 4.01     RPE 12     Perceived Dyspnea  13     VO2 Peak 14.05     Symptoms  Yes (comment)     Comments one standing 5 sec break to increase o2     Resting HR 82 bpm     Resting BP 108/62     Resting Oxygen Saturation  96 %     Exercise Oxygen Saturation  during 6 min walk 86 %     Max Ex. HR 120 bpm     Max Ex. BP 130/60     2 Minute Post BP 116/60       Interval HR   1 Minute HR 112     2 Minute HR 110     3 Minute HR 119     4 Minute HR 120     5 Minute HR 109     6 Minute HR 110     2 Minute Post HR 86     Interval Heart Rate? Yes       Interval Oxygen   Interval Oxygen? Yes     Baseline Oxygen Saturation % 96 %     1 Minute Oxygen Saturation % 87 %     1 Minute Liters of Oxygen 0 L     2 Minute Oxygen Saturation % 87 %     2 Minute Liters of Oxygen 0 L     3 Minute Oxygen Saturation % 86 %     3 Minute Liters of Oxygen 0 L     4 Minute Oxygen Saturation % 86 %     4 Minute Liters of Oxygen 0 L     5 Minute Oxygen Saturation % 87 %     5 Minute Liters of Oxygen 0 L     6 Minute Oxygen Saturation % 86 %     6 Minute Liters of Oxygen 0 L     2 Minute Post Oxygen Saturation % 95 %     2 Minute Post Liters of Oxygen 0 L              Oxygen Initial Assessment:  Oxygen Initial Assessment - 07/25/22 0824       Home Oxygen   Home Oxygen Device None    Sleep Oxygen Prescription None    Home Exercise Oxygen Prescription None    Home Resting Oxygen Prescription None    Compliance with Home Oxygen Use Yes      Intervention   Short Term Goals To learn and understand importance of monitoring SPO2 with pulse oximeter and demonstrate accurate use of the pulse oximeter.;To learn and understand importance of maintaining oxygen saturations>88%;To learn and demonstrate proper pursed lip breathing techniques or other breathing techniques.     Long  Term Goals Verbalizes importance of monitoring SPO2 with pulse oximeter and return  demonstration;Maintenance of O2 saturations>88%;Exhibits proper breathing techniques, such as pursed lip breathing or other method taught during program session;Compliance with respiratory medication             Oxygen Re-Evaluation:  Oxygen Re-Evaluation     Row Name 08/22/22 1253             Program Oxygen Prescription   Program Oxygen Prescription Continuous       Liters per minute 2         Home Oxygen   Home Oxygen Device Portable Concentrator       Home Exercise Oxygen Prescription Pulsed       Liters per minute 2       Home Resting Oxygen Prescription None  Compliance with Home Oxygen Use Yes         Goals/Expected Outcomes   Short Term Goals To learn and understand importance of monitoring SPO2 with pulse oximeter and demonstrate accurate use of the pulse oximeter.;To learn and understand importance of maintaining oxygen saturations>88%;To learn and demonstrate proper pursed lip breathing techniques or other breathing techniques.        Long  Term Goals Verbalizes importance of monitoring SPO2 with pulse oximeter and return demonstration;Maintenance of O2 saturations>88%;Exhibits proper breathing techniques, such as pursed lip breathing or other method taught during program session;Compliance with respiratory medication       Goals/Expected Outcomes Compliance                Oxygen Discharge (Final Oxygen Re-Evaluation):  Oxygen Re-Evaluation - 08/22/22 1253       Program Oxygen Prescription   Program Oxygen Prescription Continuous    Liters per minute 2      Home Oxygen   Home Oxygen Device Portable Concentrator    Home Exercise Oxygen Prescription Pulsed    Liters per minute 2    Home Resting Oxygen Prescription None    Compliance with Home Oxygen Use Yes      Goals/Expected Outcomes   Short Term Goals To learn and understand importance of monitoring SPO2 with pulse oximeter and demonstrate accurate use of the pulse oximeter.;To learn and  understand importance of maintaining oxygen saturations>88%;To learn and demonstrate proper pursed lip breathing techniques or other breathing techniques.     Long  Term Goals Verbalizes importance of monitoring SPO2 with pulse oximeter and return demonstration;Maintenance of O2 saturations>88%;Exhibits proper breathing techniques, such as pursed lip breathing or other method taught during program session;Compliance with respiratory medication    Goals/Expected Outcomes Compliance             Initial Exercise Prescription:  Initial Exercise Prescription - 07/25/22 0800       Date of Initial Exercise RX and Referring Provider   Date 07/25/22    Referring Provider Dr. Valeta Harms    Expected Discharge Date 11/09/22      Treadmill   MPH 1.8    Grade 0    Minutes 17      NuStep   Level 1    SPM 60    Minutes 22      Prescription Details   Frequency (times per week) 2    Duration Progress to 30 minutes of continuous aerobic without signs/symptoms of physical distress      Intensity   THRR 40-80% of Max Heartrate 62-124    Ratings of Perceived Exertion 11-13    Perceived Dyspnea 0-4      Resistance Training   Training Prescription Yes    Weight 4    Reps 10-15             Perform Capillary Blood Glucose checks as needed.  Exercise Prescription Changes:   Exercise Prescription Changes     Row Name 08/03/22 1244 08/22/22 1200           Response to Exercise   Blood Pressure (Admit) 120/62 116/60      Blood Pressure (Exercise) 140/68 140/64      Blood Pressure (Exit) 108/60 108/60      Heart Rate (Admit) 83 bpm 85 bpm      Heart Rate (Exercise) 113 bpm 113 bpm      Heart Rate (Exit) 90 bpm 98 bpm      Oxygen Saturation (Admit)  94 % 99 %      Oxygen Saturation (Exercise) 88 % 92 %      Oxygen Saturation (Exit) 94 % 92 %      Rating of Perceived Exertion (Exercise) 10 11      Perceived Dyspnea (Exercise) 10 11      Duration Continue with 30 min of aerobic  exercise without signs/symptoms of physical distress. Continue with 30 min of aerobic exercise without signs/symptoms of physical distress.      Intensity THRR unchanged THRR unchanged        Progression   Progression Continue to progress workloads to maintain intensity without signs/symptoms of physical distress. Continue to progress workloads to maintain intensity without signs/symptoms of physical distress.        Resistance Training   Training Prescription Yes Yes      Weight 3 3      Reps 10-15 10-15      Time 10 Minutes 10 Minutes        Treadmill   MPH 2.6 2.8      Grade 0 1.5      Minutes 17 17      METs 2.98 3.72        NuStep   Level 2 2      SPM 95 68      Minutes 22 22      METs 1.9 1.6               Exercise Comments:   Exercise Goals and Review:   Exercise Goals     Row Name 07/25/22 0853 08/22/22 1249           Exercise Goals   Increase Physical Activity Yes Yes      Intervention Provide advice, education, support and counseling about physical activity/exercise needs.;Develop an individualized exercise prescription for aerobic and resistive training based on initial evaluation findings, risk stratification, comorbidities and participant's personal goals. Provide advice, education, support and counseling about physical activity/exercise needs.;Develop an individualized exercise prescription for aerobic and resistive training based on initial evaluation findings, risk stratification, comorbidities and participant's personal goals.      Expected Outcomes Short Term: Attend rehab on a regular basis to increase amount of physical activity.;Long Term: Exercising regularly at least 3-5 days a week.;Long Term: Add in home exercise to make exercise part of routine and to increase amount of physical activity. Short Term: Attend rehab on a regular basis to increase amount of physical activity.;Long Term: Exercising regularly at least 3-5 days a week.;Long Term: Add in  home exercise to make exercise part of routine and to increase amount of physical activity.      Increase Strength and Stamina Yes Yes      Intervention Provide advice, education, support and counseling about physical activity/exercise needs.;Develop an individualized exercise prescription for aerobic and resistive training based on initial evaluation findings, risk stratification, comorbidities and participant's personal goals. Provide advice, education, support and counseling about physical activity/exercise needs.;Develop an individualized exercise prescription for aerobic and resistive training based on initial evaluation findings, risk stratification, comorbidities and participant's personal goals.      Expected Outcomes Short Term: Increase workloads from initial exercise prescription for resistance, speed, and METs.;Short Term: Perform resistance training exercises routinely during rehab and add in resistance training at home;Long Term: Improve cardiorespiratory fitness, muscular endurance and strength as measured by increased METs and functional capacity (6MWT) Short Term: Increase workloads from initial exercise prescription for resistance, speed, and METs.;Short Term: Perform  resistance training exercises routinely during rehab and add in resistance training at home;Long Term: Improve cardiorespiratory fitness, muscular endurance and strength as measured by increased METs and functional capacity (6MWT)      Able to understand and use rate of perceived exertion (RPE) scale Yes Yes      Intervention Provide education and explanation on how to use RPE scale Provide education and explanation on how to use RPE scale      Expected Outcomes Short Term: Able to use RPE daily in rehab to express subjective intensity level;Long Term:  Able to use RPE to guide intensity level when exercising independently Short Term: Able to use RPE daily in rehab to express subjective intensity level;Long Term:  Able to use  RPE to guide intensity level when exercising independently      Able to understand and use Dyspnea scale Yes Yes      Intervention Provide education and explanation on how to use Dyspnea scale Provide education and explanation on how to use Dyspnea scale      Expected Outcomes Short Term: Able to use Dyspnea scale daily in rehab to express subjective sense of shortness of breath during exertion;Long Term: Able to use Dyspnea scale to guide intensity level when exercising independently Short Term: Able to use Dyspnea scale daily in rehab to express subjective sense of shortness of breath during exertion;Long Term: Able to use Dyspnea scale to guide intensity level when exercising independently      Knowledge and understanding of Target Heart Rate Range (THRR) Yes Yes      Intervention Provide education and explanation of THRR including how the numbers were predicted and where they are located for reference Provide education and explanation of THRR including how the numbers were predicted and where they are located for reference      Expected Outcomes Short Term: Able to state/look up THRR;Short Term: Able to use daily as guideline for intensity in rehab;Long Term: Able to use THRR to govern intensity when exercising independently Short Term: Able to state/look up THRR;Short Term: Able to use daily as guideline for intensity in rehab;Long Term: Able to use THRR to govern intensity when exercising independently      Understanding of Exercise Prescription Yes Yes      Intervention Provide education, explanation, and written materials on patient's individual exercise prescription Provide education, explanation, and written materials on patient's individual exercise prescription      Expected Outcomes Short Term: Able to explain program exercise prescription;Long Term: Able to explain home exercise prescription to exercise independently Short Term: Able to explain program exercise prescription;Long Term: Able to  explain home exercise prescription to exercise independently               Exercise Goals Re-Evaluation :  Exercise Goals Re-Evaluation     Row Name 08/22/22 1249             Exercise Goal Re-Evaluation   Exercise Goals Review Increase Physical Activity;Increase Strength and Stamina;Able to understand and use rate of perceived exertion (RPE) scale;Able to understand and use Dyspnea scale;Knowledge and understanding of Target Heart Rate Range (THRR);Understanding of Exercise Prescription       Comments Pt has completed 6 sessions of PR. She is very motivated and eager to push herself. She has started using oxygen at home and in class for the warmup and treadmill. She is currently exercising at 3.72 METs on the treadmill. Will continue to monitor and progress as able.  Expected Outcomes Through exercise at home and at rehab, the patient will meet their stated goals.                Discharge Exercise Prescription (Final Exercise Prescription Changes):  Exercise Prescription Changes - 08/22/22 1200       Response to Exercise   Blood Pressure (Admit) 116/60    Blood Pressure (Exercise) 140/64    Blood Pressure (Exit) 108/60    Heart Rate (Admit) 85 bpm    Heart Rate (Exercise) 113 bpm    Heart Rate (Exit) 98 bpm    Oxygen Saturation (Admit) 99 %    Oxygen Saturation (Exercise) 92 %    Oxygen Saturation (Exit) 92 %    Rating of Perceived Exertion (Exercise) 11    Perceived Dyspnea (Exercise) 11    Duration Continue with 30 min of aerobic exercise without signs/symptoms of physical distress.    Intensity THRR unchanged      Progression   Progression Continue to progress workloads to maintain intensity without signs/symptoms of physical distress.      Resistance Training   Training Prescription Yes    Weight 3    Reps 10-15    Time 10 Minutes      Treadmill   MPH 2.8    Grade 1.5    Minutes 17    METs 3.72      NuStep   Level 2    SPM 68    Minutes 22     METs 1.6             Nutrition:  Target Goals: Understanding of nutrition guidelines, daily intake of sodium 1500mg , cholesterol 200mg , calories 30% from fat and 7% or less from saturated fats, daily to have 5 or more servings of fruits and vegetables.  Biometrics:  Pre Biometrics - 07/25/22 0854       Pre Biometrics   Height 5\' 5"  (1.651 m)    Weight 66.4 kg    Waist Circumference 34 inches    Hip Circumference 38 inches    Waist to Hip Ratio 0.89 %    BMI (Calculated) 24.36    Triceps Skinfold 20 mm    % Body Fat 34.8 %    Grip Strength 25.6 kg    Flexibility 0 in    Single Leg Stand 8.03 seconds              Nutrition Therapy Plan and Nutrition Goals:  Nutrition Therapy & Goals - 07/25/22 0853       Intervention Plan   Intervention Nutrition handout(s) given to patient.    Expected Outcomes Short Term Goal: Understand basic principles of dietary content, such as calories, fat, sodium, cholesterol and nutrients.             Nutrition Assessments:  Nutrition Assessments - 07/25/22 0853       MEDFICTS Scores   Pre Score 56            MEDIFICTS Score Key: ?70 Need to make dietary changes  40-70 Heart Healthy Diet ? 40 Therapeutic Level Cholesterol Diet   Picture Your Plate Scores: <41 Unhealthy dietary pattern with much room for improvement. 41-50 Dietary pattern unlikely to meet recommendations for good health and room for improvement. 51-60 More healthful dietary pattern, with some room for improvement.  >60 Healthy dietary pattern, although there may be some specific behaviors that could be improved.    Nutrition Goals Re-Evaluation:   Nutrition Goals Discharge (Final Nutrition  Goals Re-Evaluation):   Psychosocial: Target Goals: Acknowledge presence or absence of significant depression and/or stress, maximize coping skills, provide positive support system. Participant is able to verbalize types and ability to use techniques and  skills needed for reducing stress and depression.  Initial Review & Psychosocial Screening:  Initial Psych Review & Screening - 07/25/22 0918       Initial Review   Current issues with Current Sleep Concerns;History of Depression      Family Dynamics   Good Support System? Yes    Comments Her support system includes her husband, children, step children, grandchildren      Barriers   Psychosocial barriers to participate in program There are no identifiable barriers or psychosocial needs.      Screening Interventions   Interventions Encouraged to exercise    Expected Outcomes Long Term goal: The participant improves quality of Life and PHQ9 Scores as seen by post scores and/or verbalization of changes;Short Term goal: Identification and review with participant of any Quality of Life or Depression concerns found by scoring the questionnaire.             Quality of Life Scores:  Quality of Life - 07/25/22 0855       Quality of Life   Select Quality of Life      Quality of Life Scores   Health/Function Pre 28 %    Socioeconomic Pre 30 %    Psych/Spiritual Pre 30 %    Family Pre 28.8 %    GLOBAL Pre 28.97 %            Scores of 19 and below usually indicate a poorer quality of life in these areas.  A difference of  2-3 points is a clinically meaningful difference.  A difference of 2-3 points in the total score of the Quality of Life Index has been associated with significant improvement in overall quality of life, self-image, physical symptoms, and general health in studies assessing change in quality of life.   PHQ-9: Review Flowsheet  More data exists      07/25/2022 06/15/2020 06/11/2020 03/31/2020 02/18/2020  Depression screen PHQ 2/9  Decreased Interest 0 0 0 1 1  Down, Depressed, Hopeless 0 0 0 1 0  PHQ - 2 Score 0 0 0 2 1  Altered sleeping 1 - - 0 1  Tired, decreased energy 0 - - 1 0  Change in appetite 0 - - 1 1  Feeling bad or failure about yourself  0 - - 1  0  Trouble concentrating 1 - - 1 0  Moving slowly or fidgety/restless 0 - - 1 0  Suicidal thoughts 0 - - 0 0  PHQ-9 Score 2 - - 7 3  Difficult doing work/chores Not difficult at all - - Somewhat difficult -   Interpretation of Total Score  Total Score Depression Severity:  1-4 = Minimal depression, 5-9 = Mild depression, 10-14 = Moderate depression, 15-19 = Moderately severe depression, 20-27 = Severe depression   Psychosocial Evaluation and Intervention:   Psychosocial Re-Evaluation:  Psychosocial Re-Evaluation     Row Name 07/25/22 0918 08/15/22 1128           Psychosocial Re-Evaluation   Current issues with History of Depression;Current Sleep Concerns History of Depression;Current Sleep Concerns (P)       Comments Pt has a history of depression and anxiety, and she also has current sleep concerns. She was diagnosed with lung cancer 12/2021 and received both chemotherapy and  radiation treatment. She reports this was a hard time for her and she was prescribed xanax and took it for several months. She reports that she no longer is taking xanax and that she no longer feels like she has depression or anxiety. She takes hydroxyzine for insomnia. She reports that she has always had problems with her sleep. She has been referred for a sleep study to see if OSA plays a role in this, but she has not been called yet by the sleep center to be scheduled. She has no other identifiable psychosocial issues. She scored a 2 on her PHQ-9 and this is due to her sleep and her lack of concentration at times. She reports that she has a good support system with her husband, children, step children, and grand children. 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 Her goals while in the program are to decrease her SOB with exertion. She is eager to start the program to get herself to "110%" so she can travel next year. Pt has a  history of depression and anxiety, and she also has current sleep concerns. She was diagnosed with lung cancer 12/2021 and received both chemotherapy and radiation treatment. She reports this was a hard time for her and she was prescribed xanax and took it for several months. She reports that she no longer is taking xanax and that she no longer feels like she has depression or anxiety. She takes hydroxyzine for insomnia. She reports that she has always had problems with her sleep. She has been referred for a sleep study to see if OSA plays a role in this, but she has not been called yet by the sleep center to be scheduled. She has no other identifiable psychosocial issues. She scored a 2 on her PHQ-9 and this is due to her sleep and her lack of concentration at times. She reports that she has a good support system with her husband, children, step children, and grand children. 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 Her goals while in the program are to decrease her SOB with exertion. She is eager to start the program to get herself to "110%" so she can travel next year. (P)       Expected Outcomes Pt's sleep concerns will continue to be treated and she will have no other identifiable psychosocial issues. --      Interventions Encouraged to attend Pulmonary Rehabilitation for the exercise --      Continue Psychosocial Services  No Follow up required --               Psychosocial Discharge (Final Psychosocial Re-Evaluation):  Psychosocial Re-Evaluation - 08/15/22 1128       Psychosocial Re-Evaluation   Current issues with History of Depression;Current Sleep Concerns (P)     Comments Pt has a history of depression and anxiety, and she also has current sleep concerns. She was diagnosed with lung cancer 12/2021 and received both chemotherapy and radiation treatment. She reports this was a hard time for her and she  was prescribed xanax and took it for several months. She reports that she no longer is taking xanax and that she no longer feels like she has depression or anxiety. She takes hydroxyzine for insomnia. She reports that she has always had problems with her sleep. She has been referred  for a sleep study to see if OSA plays a role in this, but she has not been called yet by the sleep center to be scheduled. She has no other identifiable psychosocial issues. She scored a 2 on her PHQ-9 and this is due to her sleep and her lack of concentration at times. She reports that she has a good support system with her husband, children, step children, and grand children. 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 Her goals while in the program are to decrease her SOB with exertion. She is eager to start the program to get herself to "110%" so she can travel next year. (P)               Education: Education Goals: Education classes will be provided on a weekly basis, covering required topics. Participant will state understanding/return demonstration of topics presented.  Learning Barriers/Preferences:  Learning Barriers/Preferences - 07/25/22 0858       Learning Barriers/Preferences   Learning Barriers None    Learning Preferences Skilled Demonstration             Education Topics: How Lungs Work and Diseases: - Discuss the anatomy of the lungs and diseases that can affect the lungs, such as COPD.   Exercise: -Discuss the importance of exercise, FITT principles of exercise, normal and abnormal responses to exercise, and how to exercise safely.   Environmental Irritants: -Discuss types of environmental irritants and how to limit exposure to environmental irritants. Flowsheet Row PULMONARY REHAB CHRONIC OBSTRUCTIVE PULMONARY DISEASE from 08/17/2022 in [place]  Date 07/27/22  Educator  Hj  Instruction Review Code 1- Verbalizes Understanding       Meds/Inhalers and oxygen: - Discuss respiratory medications, definition of an inhaler and oxygen, and the proper way to use an inhaler and oxygen. Flowsheet Row PULMONARY REHAB CHRONIC OBSTRUCTIVE PULMONARY DISEASE from 08/17/2022 in [place]  Date 08/03/22  Educator HJ       Energy Saving Techniques: - Discuss methods to conserve energy and decrease shortness of breath when performing activities of daily living.  Flowsheet Row PULMONARY REHAB CHRONIC OBSTRUCTIVE PULMONARY DISEASE from 08/17/2022 in [place]  Date 08/10/22  Educator HB  Instruction Review Code 1- Verbalizes Understanding       Bronchial Hygiene / Breathing Techniques: - Discuss breathing mechanics, pursed-lip breathing technique,  proper posture, effective ways to clear airways, and other functional breathing techniques Flowsheet Row PULMONARY REHAB CHRONIC OBSTRUCTIVE PULMONARY DISEASE from 08/17/2022 in [place]  Date 08/17/22  Educator HB  Instruction Review Code 1- Research scientist (medical): - Provides group verbal and written instruction about the health risks of elevated stress, cause of high stress, and healthy ways to reduce stress.   Nutrition I: Fats: - Discuss the types of cholesterol, what cholesterol does to the body, and how cholesterol levels can be controlled.   Nutrition II: Labels: -Discuss the different components of food labels and how to read food labels.   Respiratory Infections: - Discuss the signs and symptoms of respiratory infections, ways to prevent respiratory infections, and the importance of seeking medical treatment when having a respiratory infection.   Stress I: Signs and Symptoms: - Discuss the causes of stress, how stress may lead to anxiety and depression, and ways to limit stress.   Stress II:  Relaxation: -Discuss relaxation techniques  to limit stress.   Oxygen for Home/Travel: - Discuss how to prepare for travel when on oxygen and proper ways to transport and store oxygen to ensure safety.   Knowledge Questionnaire Score:  Knowledge Questionnaire Score - 07/25/22 0826       Knowledge Questionnaire Score   Pre Score 5/18             Core Components/Risk Factors/Patient Goals at Admission:  Personal Goals and Risk Factors at Admission - 07/25/22 0908       Core Components/Risk Factors/Patient Goals on Admission   Improve shortness of breath with ADL's Yes    Intervention Provide education, individualized exercise plan and daily activity instruction to help decrease symptoms of SOB with activities of daily living.    Expected Outcomes Short Term: Improve cardiorespiratory fitness to achieve a reduction of symptoms when performing ADLs;Long Term: Be able to perform more ADLs without symptoms or delay the onset of symptoms             Core Components/Risk Factors/Patient Goals Review:    Core Components/Risk Factors/Patient Goals at Discharge (Final Review):    ITP Comments:   Comments: ITP REVIEW Pt is making expected progress toward pulmonary rehab goals after completing 7 sessions. Recommend continued exercise, life style modification, education, and utilization of breathing techniques to increase stamina and strength and decrease shortness of breath with exertion.

## 2022-08-24 ENCOUNTER — Encounter (HOSPITAL_COMMUNITY)
Admission: RE | Admit: 2022-08-24 | Discharge: 2022-08-24 | Disposition: A | Discharge status: Home / Self Care | Payer: 59 | Source: Ambulatory Visit | Attending: Pulmonary Disease | Admitting: Pulmonary Disease

## 2022-08-24 DIAGNOSIS — J449 Chronic obstructive pulmonary disease, unspecified: Secondary | ICD-10-CM | POA: Diagnosis not present

## 2022-08-24 DIAGNOSIS — J479 Bronchiectasis, uncomplicated: Secondary | ICD-10-CM

## 2022-08-24 NOTE — Progress Notes (Signed)
Daily Session Note  Patient Details  Name: Diane [name] MRN: [id number] Date of Birth: 1956/09/26 Referring Provider:   Flowsheet Row PULMONARY REHAB COPD ORIENTATION from 07/25/2022 in [place]  Referring Provider Dr. Valeta Harms       Encounter Date: 08/24/2022  Check In:  Session Check In - 08/24/22 1045       Check-In   Supervising physician immediately available to respond to emergencies [facility] MD immediately available    Physician(s) Dr. Dellia Cloud    Location [facility]    Staff Present Leana Roe, BS, Exercise Physiologist;Cassadie Pankonin Hassell Done, RN, BSN;Hillary Troutman BSN, RN    Virtual Visit No    Medication changes reported     No    Fall or balance concerns reported    No    Tobacco Cessation No Change    Warm-up and Cool-down Performed as group-led Higher education careers adviser Performed Yes    VAD Patient? No    PAD/SET Patient? No      Pain Assessment   Currently in Pain? No/denies    Multiple Pain Sites No             Capillary Blood Glucose: No results found for this or any previous visit (from the past 24 hour(s)).    Social History   Tobacco Use  Smoking Status Former   Packs/day: 0.50   Years: 30.00   Total pack years: 15.00   Types: Cigarettes   Quit date: 10/09/2021   Years since quitting: 0.8  Smokeless Tobacco Never  Tobacco Comments   5-10 cigarettes a day (plans to quit prior to this surgery 05/06/15)    Goals Met:  Proper associated with RPD/PD & O2 Sat Independence with exercise equipment Using PLB without cueing & demonstrates good technique Exercise tolerated well Queuing for purse lip breathing No report of concerns or symptoms today Strength training completed today  Goals Unmet:  Not Applicable  Comments: Checkout at 1145.   Dr. Kathie Dike is Medical Director for [facility].

## 2022-08-24 NOTE — Progress Notes (Signed)
Diane [name] uses 3 L of continuous oxygen while exercising in pulmonary rehab. Today we checked the patient's oxygen needs while exercising with their personal portable oxygen concentrator. Their oxygen saturation was 90-92% on 5 L of pulsed oxygen. [name] was instructed to set their liter flow at 5 L of pulsed flow while exercising with their portable oxygen concentrator.

## 2022-08-29 ENCOUNTER — Encounter (HOSPITAL_COMMUNITY): Payer: 59

## 2022-08-31 ENCOUNTER — Encounter (HOSPITAL_COMMUNITY): Payer: 59

## 2022-09-05 ENCOUNTER — Encounter (HOSPITAL_COMMUNITY)
Admission: RE | Admit: 2022-09-05 | Discharge: 2022-09-05 | Disposition: A | Discharge status: Home / Self Care | Payer: 59 | Source: Ambulatory Visit | Attending: Pulmonary Disease | Admitting: Pulmonary Disease

## 2022-09-05 VITALS — Wt 148.6 lb

## 2022-09-05 DIAGNOSIS — J449 Chronic obstructive pulmonary disease, unspecified: Secondary | ICD-10-CM | POA: Diagnosis not present

## 2022-09-05 DIAGNOSIS — J479 Bronchiectasis, uncomplicated: Secondary | ICD-10-CM

## 2022-09-05 NOTE — Progress Notes (Signed)
Daily Session Note  Patient Details  Name: Kenyia Wambolt MRN: [id number] Date of Birth: [birth year] Referring Provider:   Flowsheet Row PULMONARY REHAB COPD ORIENTATION from 07/25/2022 in [place]  Referring Provider Dr. Valeta Harms       Encounter Date: 09/05/2022  Check In:  Session Check In - 09/05/22 1038       Check-In   Supervising physician immediately available to respond to emergencies [facility] MD immediately available    Physician(s) Dr Domenic Polite    Location [facility]    Staff Present Leana Roe, BS, Exercise Physiologist;Amandine Covino Hassell Done, RN, BSN;Phyllis Billingsley, RN    Virtual Visit No    Medication changes reported     No    Fall or balance concerns reported    No    Tobacco Cessation No Change    Warm-up and Cool-down Performed as group-led instruction    Resistance Training Performed Yes    VAD Patient? No    PAD/SET Patient? No      Pain Assessment   Currently in Pain? No/denies    Multiple Pain Sites No             Capillary Blood Glucose: No results found for this or any previous visit (from the past 24 hour(s)).    Social History   Tobacco Use  Smoking Status Former   Packs/day: 0.50   Years: 30.00   Total pack years: 15.00   Types: Cigarettes   Quit date: 10/09/2021   Years since quitting: 0.9  Smokeless Tobacco Never  Tobacco Comments   5-10 cigarettes a day (plans to quit prior to this surgery 05/06/15)    Goals Met:  Proper associated with RPD/PD & O2 Sat Independence with exercise equipment Using PLB without cueing & demonstrates good technique Exercise tolerated well Queuing for purse lip breathing No report of concerns or symptoms today Strength training completed today  Goals Unmet:  Not Applicable  Comments: Checkout at 1145.   Dr. Kathie Dike is Medical Director for [facility].

## 2022-09-07 ENCOUNTER — Encounter (HOSPITAL_COMMUNITY): Payer: 59

## 2022-09-12 ENCOUNTER — Encounter (HOSPITAL_COMMUNITY)
Admission: RE | Admit: 2022-09-12 | Discharge: 2022-09-12 | Disposition: A | Discharge status: Home / Self Care | Payer: 59 | Source: Ambulatory Visit | Attending: Pulmonary Disease | Admitting: Pulmonary Disease

## 2022-09-12 DIAGNOSIS — J479 Bronchiectasis, uncomplicated: Secondary | ICD-10-CM

## 2022-09-12 DIAGNOSIS — J449 Chronic obstructive pulmonary disease, unspecified: Secondary | ICD-10-CM

## 2022-09-12 NOTE — Progress Notes (Signed)
Daily Session Note  Patient Details  Name: Emmelyn Schmale MRN: [id number] Date of Birth: [birth year] Referring Provider:   Flowsheet Row PULMONARY REHAB COPD ORIENTATION from 07/25/2022 in [place]  Referring Provider Dr. Valeta Harms       Encounter Date: 09/12/2022  Check In:  Session Check In - 09/12/22 1045       Check-In   Supervising physician immediately available to respond to emergencies [facility] MD immediately available    Physician(s) Dr Dellia Cloud    Location [facility]    Staff Present Leana Roe, BS, Exercise Physiologist;Denys Labree Hassell Done, RN, BSN;Dalton Sherrie George, MS, ACSM-CEP    Virtual Visit No    Medication changes reported     No    Fall or balance concerns reported    No    Tobacco Cessation No Change    Warm-up and Cool-down Performed as group-led instruction    Resistance Training Performed Yes    VAD Patient? No    PAD/SET Patient? No      Pain Assessment   Currently in Pain? No/denies    Multiple Pain Sites No             Capillary Blood Glucose: No results found for this or any previous visit (from the past 24 hour(s)).    Social History   Tobacco Use  Smoking Status Former   Packs/day: 0.50   Years: 30.00   Total pack years: 15.00   Types: Cigarettes   Quit date: 10/09/2021   Years since quitting: 0.9  Smokeless Tobacco Never  Tobacco Comments   5-10 cigarettes a day (plans to quit prior to this surgery 05/06/15)    Goals Met:  Proper associated with RPD/PD & O2 Sat Independence with exercise equipment Using PLB without cueing & demonstrates good technique Exercise tolerated well Queuing for purse lip breathing No report of concerns or symptoms today Strength training completed today  Goals Unmet:  Not Applicable  Comments: Checkout at 1145.   Dr. Kathie Dike is Medical Director for [facility].

## 2022-09-14 ENCOUNTER — Encounter (HOSPITAL_COMMUNITY)
Admission: RE | Admit: 2022-09-14 | Discharge: 2022-09-14 | Disposition: A | Discharge status: Home / Self Care | Payer: 59 | Source: Ambulatory Visit | Attending: Pulmonary Disease | Admitting: Pulmonary Disease

## 2022-09-14 DIAGNOSIS — J449 Chronic obstructive pulmonary disease, unspecified: Secondary | ICD-10-CM

## 2022-09-14 DIAGNOSIS — J479 Bronchiectasis, uncomplicated: Secondary | ICD-10-CM

## 2022-09-14 NOTE — Progress Notes (Signed)
Daily Session Note  Patient Details  Name: Diane [name] MRN: [id number] Date of Birth: 11/01/1955 Referring Provider:   Flowsheet Row PULMONARY REHAB COPD ORIENTATION from 07/25/2022 in [place] [facility]  Referring Provider Dr. Icard       Encounter Date: 09/14/2022  Check In:  Session Check In - 09/14/22 1045       Check-In   Supervising physician immediately available to respond to emergencies [facility] MD immediately available    Physician(s) Dr Mallipeddi    Location [facility]    Staff Present Heather Bailey, BS, Exercise Physiologist; , RN, BSN    Virtual Visit No    Medication changes reported     No    Fall or balance concerns reported    No    Tobacco Cessation No Change    Warm-up and Cool-down Performed as group-led instruction    Resistance Training Performed Yes    VAD Patient? No    PAD/SET Patient? No      Pain Assessment   Currently in Pain? No/denies    Multiple Pain Sites No             Capillary Blood Glucose: No results found for this or any previous visit (from the past 24 hour(s)).    Social History   Tobacco Use  Smoking Status Former   Packs/day: 0.50   Years: 30.00   Total pack years: 15.00   Types: Cigarettes   Quit date: 10/09/2021   Years since quitting: 0.9  Smokeless Tobacco Never  Tobacco Comments   5-10 cigarettes a day (plans to quit prior to this surgery 05/06/15)    Goals Met:  Proper associated with RPD/PD & O2 Sat Independence with exercise equipment Using PLB without cueing & demonstrates good technique Exercise tolerated well Queuing for purse lip breathing No report of concerns or symptoms today Strength training completed today  Goals Unmet:  Not Applicable  Comments: Checkout at 1145.   Dr. Jehanzeb Memon is Medical Director for [facility]. 

## 2022-09-19 ENCOUNTER — Encounter (HOSPITAL_COMMUNITY)
Admission: RE | Admit: 2022-09-19 | Discharge: 2022-09-19 | Disposition: A | Discharge status: Home / Self Care | Payer: 59 | Source: Ambulatory Visit | Attending: Pulmonary Disease | Admitting: Pulmonary Disease

## 2022-09-19 VITALS — Wt 148.4 lb

## 2022-09-19 DIAGNOSIS — J479 Bronchiectasis, uncomplicated: Secondary | ICD-10-CM | POA: Diagnosis present

## 2022-09-19 DIAGNOSIS — J449 Chronic obstructive pulmonary disease, unspecified: Secondary | ICD-10-CM | POA: Diagnosis present

## 2022-09-19 NOTE — Progress Notes (Signed)
Daily Session Note  Patient Details  Name: Normagene Harvie MRN: [id number] Date of Birth: 08-09-1956 Referring Provider:   Flowsheet Row PULMONARY REHAB COPD ORIENTATION from 07/25/2022 in [place]  Referring Provider Dr. Valeta Harms       Encounter Date: 09/19/2022  Check In:  Session Check In - 09/19/22 1045       Check-In   Supervising physician immediately available to respond to emergencies [facility] MD immediately available    Physician(s) Dr Harl Bowie    Location [facility]    Staff Present Leana Roe, BS, Exercise Physiologist;Jance Siek Hassell Done, RN, BSN    Virtual Visit No    Medication changes reported     No    Fall or balance concerns reported    No    Tobacco Cessation No Change    Warm-up and Cool-down Performed as group-led instruction    Resistance Training Performed Yes    VAD Patient? No    PAD/SET Patient? No      Pain Assessment   Currently in Pain? No/denies    Multiple Pain Sites No             Capillary Blood Glucose: No results found for this or any previous visit (from the past 24 hour(s)).    Social History   Tobacco Use  Smoking Status Former   Packs/day: 0.50   Years: 30.00   Total pack years: 15.00   Types: Cigarettes   Quit date: 10/09/2021   Years since quitting: 0.9  Smokeless Tobacco Never  Tobacco Comments   5-10 cigarettes a day (plans to quit prior to this surgery 05/06/15)    Goals Met:  Proper associated with RPD/PD & O2 Sat Independence with exercise equipment Using PLB without cueing & demonstrates good technique Exercise tolerated well Queuing for purse lip breathing No report of concerns or symptoms today Strength training completed today  Goals Unmet:  Not Applicable  Comments: Checkout at 1145.   Dr. Kathie Dike is Medical Director for [facility].

## 2022-09-20 NOTE — Progress Notes (Signed)
Pulmonary Individual Treatment Plan  Patient Details  Name: Diane [name] MRN: [id number] Date of Birth: [birth year] Referring Provider:   Flowsheet Row PULMONARY REHAB COPD ORIENTATION from 07/25/2022 in [place]  Referring Provider Dr. Valeta Harms       Initial Encounter Date:  Flowsheet Row PULMONARY REHAB COPD ORIENTATION from 07/25/2022 in [place]  Date 07/25/22       Visit Diagnosis: Bronchiectasis, uncomplicated ([place])  Chronic obstructive pulmonary disease, unspecified COPD type ([place])  Patient's Home Medications on Admission:   Current Outpatient Medications:    ALPRAZolam (XANAX) 0.5 MG tablet, Take 0.5 mg by mouth daily as needed. (Patient not taking: Reported on 08/08/2022), Disp: , Rfl:    cetirizine (ZYRTEC) 10 MG tablet, Take 10 mg by mouth at bedtime., Disp: , Rfl:    doxycycline (VIBRA-TABS) 100 MG tablet, Take 1 tablet (100 mg total) by mouth 2 (two) times daily., Disp: 14 tablet, Rfl: 0   EPINEPHrine (AUVI-Q) 0.3 mg/0.3 mL IJ SOAJ injection, Inject 0.3 mLs (0.3 mg total) into the muscle as needed for anaphylaxis., Disp: 2 each, Rfl: 1   fluticasone (FLONASE) 50 MCG/ACT nasal spray, Place 2 sprays into both nostrils daily. (Patient taking differently: Place 2 sprays into both nostrils daily as needed for allergies.), Disp: 16 g, Rfl: 5   hydrochlorothiazide (MICROZIDE) 12.5 MG capsule, Take 12.5 mg by mouth daily., Disp: , Rfl:    hydrOXYzine (ATARAX) 25 MG tablet, Take 25 mg by mouth daily., Disp: , Rfl:    omeprazole (PRILOSEC) 20 MG capsule, Take 1 capsule (20 mg total) by mouth 2 (two) times daily. (Needs to be seen before next refill), Disp: 60 capsule, Rfl: 0   OVER THE COUNTER MEDICATION, Take 1 capsule by mouth daily. Probiotic, Disp: , Rfl:    predniSONE (DELTASONE) 10 MG tablet, Prednisone taper; 10 mg tablets: 4 tabs x 2 days, 3 tabs x 2 days, 2 tabs x 2 days 1 tab x 2 days then stop., Disp: 20 tablet,  Rfl: 0   rosuvastatin (CRESTOR) 10 MG tablet, Take 10 mg by mouth at bedtime., Disp: , Rfl:    umeclidinium-vilanterol (ANORO ELLIPTA) 62.5-25 MCG/ACT AEPB, Inhale 1 puff into the lungs daily., Disp: 46 each, Rfl: 3  Past Medical History: Past Medical History:  Diagnosis Date   Anxiety    Arthritis    fingers, left foot   C. difficile diarrhea 2015   COPD (chronic obstructive pulmonary disease) (HCC)    Depression    Eczema    H pylori ulcer    Headache(784.0)    Hypercholesterolemia    Hypertension    Kidney stones    20 years ago   Pneumonia    5 years ago   Seizures ([facility])    2 years ago, "cluster of seizures" none since   Shingles     Tobacco Use: Social History   Tobacco Use  Smoking Status Former   Packs/day: 0.50   Years: 30.00   Total pack years: 15.00   Types: Cigarettes   Quit date: 10/09/2021   Years since quitting: 0.9  Smokeless Tobacco Never  Tobacco Comments   5-10 cigarettes a day (plans to quit prior to this surgery 05/06/15)    Labs: Review Flowsheet  More data exists      Latest Ref Rng & Units 01/11/2018 12/03/2019 01/06/2021 02/17/2021 10/14/2021  Labs for ITP Cardiac and Pulmonary Rehab  Cholestrol <200 mg/dL 221  273  232  258  173  LDL (calc) mg/dL (calc) 125  164  152  169  95   HDL-C > OR = 50 mg/dL 76  62  55  63  55   Trlycerides <150 mg/dL 100  255  123  129  129   Hemoglobin A1c <7.0 % 5.1  - - - -    Capillary Blood Glucose: Lab Results  Component Value Date   GLUCAP 100 (H) 12/13/2021     Pulmonary Assessment Scores:  Pulmonary Assessment Scores     Row Name 07/25/22 0824         ADL UCSD   SOB Score total 11     Rest 0     Walk 0     Stairs 1     Bath 0     Dress 0     Shop 0       CAT Score   CAT Score 21       mMRC Score   mMRC Score 1             UCSD: Self-administered rating of dyspnea associated with activities of daily living (ADLs) 6-point scale (0 = "not at all" to 5 = "maximal or unable to  do because of breathlessness")  Scoring Scores range from 0 to 120.  Minimally important difference is 5 units  CAT: CAT can identify the health impairment of COPD patients and is better correlated with disease progression.  CAT has a scoring range of zero to 40. The CAT score is classified into four groups of low (less than 10), medium (10 - 20), high (21-30) and very high (31-40) based on the impact level of disease on health status. A CAT score over 10 suggests significant symptoms.  A worsening CAT score could be explained by an exacerbation, poor medication adherence, poor inhaler technique, or progression of COPD or comorbid conditions.  CAT MCID is 2 points  mMRC: mMRC (Modified Medical Research Council) Dyspnea Scale is used to assess the degree of baseline functional disability in patients of respiratory disease due to dyspnea. No minimal important difference is established. A decrease in score of 1 point or greater is considered a positive change.   Pulmonary Function Assessment:   Exercise Target Goals: Exercise Program Goal: Individual exercise prescription set using results from initial 6 min walk test and THRR while considering  patient's activity barriers and safety.   Exercise Prescription Goal: Initial exercise prescription builds to 30-45 minutes a day of aerobic activity, 2-3 days per week.  Home exercise guidelines will be given to patient during program as part of exercise prescription that the participant will acknowledge.  Activity Barriers & Risk Stratification:  Activity Barriers & Cardiac Risk Stratification - 07/25/22 0847       Activity Barriers & Cardiac Risk Stratification   Activity Barriers Arthritis;Back Problems;Shortness of Breath    Cardiac Risk Stratification Moderate             6 Minute Walk:  6 Minute Walk     Row Name 07/25/22 0847         6 Minute Walk   Phase Initial     Distance 1600 feet     Walk Time 6 minutes     # of Rest  Breaks 1     MPH 3.03     METS 4.01     RPE 12     Perceived Dyspnea  13     VO2 Peak 14.05     Symptoms  Yes (comment)     Comments one standing 5 sec break to increase o2     Resting HR 82 bpm     Resting BP 108/62     Resting Oxygen Saturation  96 %     Exercise Oxygen Saturation  during 6 min walk 86 %     Max Ex. HR 120 bpm     Max Ex. BP 130/60     2 Minute Post BP 116/60       Interval HR   1 Minute HR 112     2 Minute HR 110     3 Minute HR 119     4 Minute HR 120     5 Minute HR 109     6 Minute HR 110     2 Minute Post HR 86     Interval Heart Rate? Yes       Interval Oxygen   Interval Oxygen? Yes     Baseline Oxygen Saturation % 96 %     1 Minute Oxygen Saturation % 87 %     1 Minute Liters of Oxygen 0 L     2 Minute Oxygen Saturation % 87 %     2 Minute Liters of Oxygen 0 L     3 Minute Oxygen Saturation % 86 %     3 Minute Liters of Oxygen 0 L     4 Minute Oxygen Saturation % 86 %     4 Minute Liters of Oxygen 0 L     5 Minute Oxygen Saturation % 87 %     5 Minute Liters of Oxygen 0 L     6 Minute Oxygen Saturation % 86 %     6 Minute Liters of Oxygen 0 L     2 Minute Post Oxygen Saturation % 95 %     2 Minute Post Liters of Oxygen 0 L              Oxygen Initial Assessment:  Oxygen Initial Assessment - 07/25/22 0824       Home Oxygen   Home Oxygen Device None    Sleep Oxygen Prescription None    Home Exercise Oxygen Prescription None    Home Resting Oxygen Prescription None    Compliance with Home Oxygen Use Yes      Intervention   Short Term Goals To learn and understand importance of monitoring SPO2 with pulse oximeter and demonstrate accurate use of the pulse oximeter.;To learn and understand importance of maintaining oxygen saturations>88%;To learn and demonstrate proper pursed lip breathing techniques or other breathing techniques.     Long  Term Goals Verbalizes importance of monitoring SPO2 with pulse oximeter and return  demonstration;Maintenance of O2 saturations>88%;Exhibits proper breathing techniques, such as pursed lip breathing or other method taught during program session;Compliance with respiratory medication             Oxygen Re-Evaluation:  Oxygen Re-Evaluation     Row Name 08/22/22 1253 09/19/22 1246           Program Oxygen Prescription   Program Oxygen Prescription Continuous Continuous      Liters per minute 2 2        Home Oxygen   Home Oxygen Device Portable Concentrator Portable Concentrator      Sleep Oxygen Prescription -- None      Home Exercise Oxygen Prescription Pulsed Pulsed      Liters per minute 2 5  Home Resting Oxygen Prescription None None      Compliance with Home Oxygen Use Yes Yes        Goals/Expected Outcomes   Short Term Goals To learn and understand importance of monitoring SPO2 with pulse oximeter and demonstrate accurate use of the pulse oximeter.;To learn and understand importance of maintaining oxygen saturations>88%;To learn and demonstrate proper pursed lip breathing techniques or other breathing techniques.  To learn and understand importance of monitoring SPO2 with pulse oximeter and demonstrate accurate use of the pulse oximeter.;To learn and understand importance of maintaining oxygen saturations>88%;To learn and demonstrate proper pursed lip breathing techniques or other breathing techniques.       Long  Term Goals Verbalizes importance of monitoring SPO2 with pulse oximeter and return demonstration;Maintenance of O2 saturations>88%;Exhibits proper breathing techniques, such as pursed lip breathing or other method taught during program session;Compliance with respiratory medication --      Goals/Expected Outcomes Compliance Compliance               Oxygen Discharge (Final Oxygen Re-Evaluation):  Oxygen Re-Evaluation - 09/19/22 1246       Program Oxygen Prescription   Program Oxygen Prescription Continuous    Liters per minute 2       Home Oxygen   Home Oxygen Device Portable Concentrator    Sleep Oxygen Prescription None    Home Exercise Oxygen Prescription Pulsed    Liters per minute 5    Home Resting Oxygen Prescription None    Compliance with Home Oxygen Use Yes      Goals/Expected Outcomes   Short Term Goals To learn and understand importance of monitoring SPO2 with pulse oximeter and demonstrate accurate use of the pulse oximeter.;To learn and understand importance of maintaining oxygen saturations>88%;To learn and demonstrate proper pursed lip breathing techniques or other breathing techniques.     Goals/Expected Outcomes Compliance             Initial Exercise Prescription:  Initial Exercise Prescription - 07/25/22 0800       Date of Initial Exercise RX and Referring Provider   Date 07/25/22    Referring Provider Dr. Valeta Harms    Expected Discharge Date 11/09/22      Treadmill   MPH 1.8    Grade 0    Minutes 17      NuStep   Level 1    SPM 60    Minutes 22      Prescription Details   Frequency (times per week) 2    Duration Progress to 30 minutes of continuous aerobic without signs/symptoms of physical distress      Intensity   THRR 40-80% of Max Heartrate 62-124    Ratings of Perceived Exertion 11-13    Perceived Dyspnea 0-4      Resistance Training   Training Prescription Yes    Weight 4    Reps 10-15             Perform Capillary Blood Glucose checks as needed.  Exercise Prescription Changes:   Exercise Prescription Changes     Row Name 08/03/22 1244 08/22/22 1200 09/05/22 1200 09/19/22 1200       Response to Exercise   Blood Pressure (Admit) 120/62 116/60 128/70 114/70    Blood Pressure (Exercise) 140/68 140/64 114/64 110/60    Blood Pressure (Exit) 108/60 108/60 112/60 108/64    Heart Rate (Admit) 83 bpm 85 bpm 85 bpm 91 bpm    Heart Rate (Exercise) 113 bpm 113 bpm  112 bpm 116 bpm    Heart Rate (Exit) 90 bpm 98 bpm 96 bpm 96 bpm    Oxygen Saturation (Admit) 94 % 99  % 97 % 93 %    Oxygen Saturation (Exercise) 88 % 92 % 91 % 91 %    Oxygen Saturation (Exit) 94 % 92 % 95 % 93 %    Rating of Perceived Exertion (Exercise) 10 11 11 11     Perceived Dyspnea (Exercise) 10 11 11 11     Duration Continue with 30 min of aerobic exercise without signs/symptoms of physical distress. Continue with 30 min of aerobic exercise without signs/symptoms of physical distress. Continue with 30 min of aerobic exercise without signs/symptoms of physical distress. Continue with 30 min of aerobic exercise without signs/symptoms of physical distress.    Intensity THRR unchanged THRR unchanged THRR unchanged THRR unchanged      Progression   Progression Continue to progress workloads to maintain intensity without signs/symptoms of physical distress. Continue to progress workloads to maintain intensity without signs/symptoms of physical distress. Continue to progress workloads to maintain intensity without signs/symptoms of physical distress. Continue to progress workloads to maintain intensity without signs/symptoms of physical distress.      Resistance Training   Training Prescription Yes Yes Yes Yes    Weight 3 3 3 3     Reps 10-15 10-15 10-15 10-15    Time 10 Minutes 10 Minutes 10 Minutes 10 Minutes      Oxygen   Oxygen -- -- Continuous Continuous    Liters -- -- 2 4      Treadmill   MPH 2.6 2.8 2.8 2.8    Grade 0 1.5 1.5 2.5    Minutes 17 17 17 17     METs 2.98 3.72 3.7 4.11      NuStep   Level 2 2 3 3     SPM 95 68 80 81    Minutes 22 22 22 22     METs 1.9 1.6 1.8 1.8             Exercise Comments:   Exercise Goals and Review:   Exercise Goals     Row Name 07/25/22 0853 08/22/22 1249 09/19/22 1239         Exercise Goals   Increase Physical Activity Yes Yes Yes     Intervention Provide advice, education, support and counseling about physical activity/exercise needs.;Develop an individualized exercise prescription for aerobic and resistive training based on  initial evaluation findings, risk stratification, comorbidities and participant's personal goals. Provide advice, education, support and counseling about physical activity/exercise needs.;Develop an individualized exercise prescription for aerobic and resistive training based on initial evaluation findings, risk stratification, comorbidities and participant's personal goals. Provide advice, education, support and counseling about physical activity/exercise needs.;Develop an individualized exercise prescription for aerobic and resistive training based on initial evaluation findings, risk stratification, comorbidities and participant's personal goals.     Expected Outcomes Short Term: Attend rehab on a regular basis to increase amount of physical activity.;Long Term: Exercising regularly at least 3-5 days a week.;Long Term: Add in home exercise to make exercise part of routine and to increase amount of physical activity. Short Term: Attend rehab on a regular basis to increase amount of physical activity.;Long Term: Exercising regularly at least 3-5 days a week.;Long Term: Add in home exercise to make exercise part of routine and to increase amount of physical activity. Short Term: Attend rehab on a regular basis to increase amount of physical activity.;Long Term: Exercising regularly  at least 3-5 days a week.;Long Term: Add in home exercise to make exercise part of routine and to increase amount of physical activity.     Increase Strength and Stamina Yes Yes Yes     Intervention Provide advice, education, support and counseling about physical activity/exercise needs.;Develop an individualized exercise prescription for aerobic and resistive training based on initial evaluation findings, risk stratification, comorbidities and participant's personal goals. Provide advice, education, support and counseling about physical activity/exercise needs.;Develop an individualized exercise prescription for aerobic and resistive  training based on initial evaluation findings, risk stratification, comorbidities and participant's personal goals. Provide advice, education, support and counseling about physical activity/exercise needs.;Develop an individualized exercise prescription for aerobic and resistive training based on initial evaluation findings, risk stratification, comorbidities and participant's personal goals.     Expected Outcomes Short Term: Increase workloads from initial exercise prescription for resistance, speed, and METs.;Short Term: Perform resistance training exercises routinely during rehab and add in resistance training at home;Long Term: Improve cardiorespiratory fitness, muscular endurance and strength as measured by increased METs and functional capacity (6MWT) Short Term: Increase workloads from initial exercise prescription for resistance, speed, and METs.;Short Term: Perform resistance training exercises routinely during rehab and add in resistance training at home;Long Term: Improve cardiorespiratory fitness, muscular endurance and strength as measured by increased METs and functional capacity (6MWT) Short Term: Increase workloads from initial exercise prescription for resistance, speed, and METs.;Short Term: Perform resistance training exercises routinely during rehab and add in resistance training at home;Long Term: Improve cardiorespiratory fitness, muscular endurance and strength as measured by increased METs and functional capacity (6MWT)     Able to understand and use rate of perceived exertion (RPE) scale Yes Yes Yes     Intervention Provide education and explanation on how to use RPE scale Provide education and explanation on how to use RPE scale Provide education and explanation on how to use RPE scale     Expected Outcomes Short Term: Able to use RPE daily in rehab to express subjective intensity level;Long Term:  Able to use RPE to guide intensity level when exercising independently Short Term: Able  to use RPE daily in rehab to express subjective intensity level;Long Term:  Able to use RPE to guide intensity level when exercising independently Short Term: Able to use RPE daily in rehab to express subjective intensity level;Long Term:  Able to use RPE to guide intensity level when exercising independently     Able to understand and use Dyspnea scale Yes Yes Yes     Intervention Provide education and explanation on how to use Dyspnea scale Provide education and explanation on how to use Dyspnea scale Provide education and explanation on how to use Dyspnea scale     Expected Outcomes Short Term: Able to use Dyspnea scale daily in rehab to express subjective sense of shortness of breath during exertion;Long Term: Able to use Dyspnea scale to guide intensity level when exercising independently Short Term: Able to use Dyspnea scale daily in rehab to express subjective sense of shortness of breath during exertion;Long Term: Able to use Dyspnea scale to guide intensity level when exercising independently Short Term: Able to use Dyspnea scale daily in rehab to express subjective sense of shortness of breath during exertion;Long Term: Able to use Dyspnea scale to guide intensity level when exercising independently     Knowledge and understanding of Target Heart Rate Range (THRR) Yes Yes Yes     Intervention Provide education and explanation of THRR including how the  numbers were predicted and where they are located for reference Provide education and explanation of THRR including how the numbers were predicted and where they are located for reference Provide education and explanation of THRR including how the numbers were predicted and where they are located for reference     Expected Outcomes Short Term: Able to state/look up THRR;Short Term: Able to use daily as guideline for intensity in rehab;Long Term: Able to use THRR to govern intensity when exercising independently Short Term: Able to state/look up  THRR;Short Term: Able to use daily as guideline for intensity in rehab;Long Term: Able to use THRR to govern intensity when exercising independently Short Term: Able to state/look up THRR;Short Term: Able to use daily as guideline for intensity in rehab;Long Term: Able to use THRR to govern intensity when exercising independently     Understanding of Exercise Prescription Yes Yes Yes     Intervention Provide education, explanation, and written materials on patient's individual exercise prescription Provide education, explanation, and written materials on patient's individual exercise prescription Provide education, explanation, and written materials on patient's individual exercise prescription     Expected Outcomes Short Term: Able to explain program exercise prescription;Long Term: Able to explain home exercise prescription to exercise independently Short Term: Able to explain program exercise prescription;Long Term: Able to explain home exercise prescription to exercise independently Short Term: Able to explain program exercise prescription;Long Term: Able to explain home exercise prescription to exercise independently              Exercise Goals Re-Evaluation :  Exercise Goals Re-Evaluation     Row Name 08/22/22 1249 09/19/22 1240           Exercise Goal Re-Evaluation   Exercise Goals Review Increase Physical Activity;Increase Strength and Stamina;Able to understand and use rate of perceived exertion (RPE) scale;Able to understand and use Dyspnea scale;Knowledge and understanding of Target Heart Rate Range (THRR);Understanding of Exercise Prescription Increase Physical Activity;Increase Strength and Stamina;Able to understand and use rate of perceived exertion (RPE) scale;Able to understand and use Dyspnea scale;Knowledge and understanding of Target Heart Rate Range (THRR);Understanding of Exercise Prescription      Comments Diane [name] has completed 6 sessions of PR. Diane [name] is very motivated and eager  to push herself. Diane [name] has started using oxygen at home and in class for the warmup and treadmill. Diane [name] is currently exercising at 3.72 METs on the treadmill. Will continue to monitor and progress as able. Diane [name] has completed 11 sessions of PR. Diane [name] continues to be matoviated during class and pushes herself. Diane [name] has had to increase Diane [name] oxygen on the treadmill to 4L and uses oxygen during the warmup as well. Diane [name] is currently exercising at 4.11 METs on the treadmill. Will continue to monitor and progress as able.      Expected Outcomes Through exercise at home and at rehab, the patient will meet their stated goals. Through exercise at home and at rehab, the patient will meet their stated goals.               Discharge Exercise Prescription (Final Exercise Prescription Changes):  Exercise Prescription Changes - 09/19/22 1200       Response to Exercise   Blood Pressure (Admit) 114/70    Blood Pressure (Exercise) 110/60    Blood Pressure (Exit) 108/64    Heart Rate (Admit) 91 bpm    Heart Rate (Exercise) 116 bpm    Heart Rate (Exit) 96 bpm    Oxygen Saturation (Admit) 93 %  Oxygen Saturation (Exercise) 91 %    Oxygen Saturation (Exit) 93 %    Rating of Perceived Exertion (Exercise) 11    Perceived Dyspnea (Exercise) 11    Duration Continue with 30 min of aerobic exercise without signs/symptoms of physical distress.    Intensity THRR unchanged      Progression   Progression Continue to progress workloads to maintain intensity without signs/symptoms of physical distress.      Resistance Training   Training Prescription Yes    Weight 3    Reps 10-15    Time 10 Minutes      Oxygen   Oxygen Continuous    Liters 4      Treadmill   MPH 2.8    Grade 2.5    Minutes 17    METs 4.11      NuStep   Level 3    SPM 81    Minutes 22    METs 1.8             Nutrition:  Target Goals: Understanding of nutrition guidelines, daily intake of sodium 1500mg , cholesterol 200mg , calories 30%  from fat and 7% or less from saturated fats, daily to have 5 or more servings of fruits and vegetables.  Biometrics:  Pre Biometrics - 07/25/22 0854       Pre Biometrics   Height 5\' 5"  (1.651 m)    Weight 66.4 kg    Waist Circumference 34 inches    Hip Circumference 38 inches    Waist to Hip Ratio 0.89 %    BMI (Calculated) 24.36    Triceps Skinfold 20 mm    % Body Fat 34.8 %    Grip Strength 25.6 kg    Flexibility 0 in    Single Leg Stand 8.03 seconds              Nutrition Therapy Plan and Nutrition Goals:  Nutrition Therapy & Goals - 07/25/22 0853       Intervention Plan   Intervention Nutrition handout(s) given to patient.    Expected Outcomes Short Term Goal: Understand basic principles of dietary content, such as calories, fat, sodium, cholesterol and nutrients.             Nutrition Assessments:  Nutrition Assessments - 07/25/22 0853       MEDFICTS Scores   Pre Score 56            MEDIFICTS Score Key: ?70 Need to make dietary changes  40-70 Heart Healthy Diet ? 40 Therapeutic Level Cholesterol Diet   Picture Your Plate Scores: <49 Unhealthy dietary pattern with much room for improvement. 41-50 Dietary pattern unlikely to meet recommendations for good health and room for improvement. 51-60 More healthful dietary pattern, with some room for improvement.  >60 Healthy dietary pattern, although there may be some specific behaviors that could be improved.    Nutrition Goals Re-Evaluation:   Nutrition Goals Discharge (Final Nutrition Goals Re-Evaluation):   Psychosocial: Target Goals: Acknowledge presence or absence of significant depression and/or stress, maximize coping skills, provide positive support system. Participant is able to verbalize types and ability to use techniques and skills needed for reducing stress and depression.  Initial Review & Psychosocial Screening:  Initial Psych Review & Screening - 07/25/22 0918       Initial  Review   Current issues with Current Sleep Concerns;History of Depression      Family Dynamics   Good Support System? Yes    Comments Diane [name]  support system includes Diane [name] husband, children, step children, grandchildren      Barriers   Psychosocial barriers to participate in program There are no identifiable barriers or psychosocial needs.      Screening Interventions   Interventions Encouraged to exercise    Expected Outcomes Long Term goal: The participant improves quality of Life and PHQ9 Scores as seen by post scores and/or verbalization of changes;Short Term goal: Identification and review with participant of any Quality of Life or Depression concerns found by scoring the questionnaire.             Quality of Life Scores:  Quality of Life - 07/25/22 0855       Quality of Life   Select Quality of Life      Quality of Life Scores   Health/Function Pre 28 %    Socioeconomic Pre 30 %    Psych/Spiritual Pre 30 %    Family Pre 28.8 %    GLOBAL Pre 28.97 %            Scores of 19 and below usually indicate a poorer quality of life in these areas.  A difference of  2-3 points is a clinically meaningful difference.  A difference of 2-3 points in the total score of the Quality of Life Index has been associated with significant improvement in overall quality of life, self-image, physical symptoms, and general health in studies assessing change in quality of life.   PHQ-9: Review Flowsheet  More data exists      07/25/2022 06/15/2020 06/11/2020 03/31/2020 02/18/2020  Depression screen PHQ 2/9  Decreased Interest 0 0 0 1 1  Down, Depressed, Hopeless 0 0 0 1 0  PHQ - 2 Score 0 0 0 2 1  Altered sleeping 1 - - 0 1  Tired, decreased energy 0 - - 1 0  Change in appetite 0 - - 1 1  Feeling bad or failure about yourself  0 - - 1 0  Trouble concentrating 1 - - 1 0  Moving slowly or fidgety/restless 0 - - 1 0  Suicidal thoughts 0 - - 0 0  PHQ-9 Score 2 - - 7 3  Difficult doing  work/chores Not difficult at all - - Somewhat difficult -   Interpretation of Total Score  Total Score Depression Severity:  1-4 = Minimal depression, 5-9 = Mild depression, 10-14 = Moderate depression, 15-19 = Moderately severe depression, 20-27 = Severe depression   Psychosocial Evaluation and Intervention:   Psychosocial Re-Evaluation:  Psychosocial Re-Evaluation     Row Name 07/25/22 0918 08/15/22 1128 09/12/22 0830         Psychosocial Re-Evaluation   Current issues with History of Depression;Current Sleep Concerns History of Depression;Current Sleep Concerns (P)  --     Comments Diane [name] has a history of depression and anxiety, and Diane [name] also has current sleep concerns. Diane [name] was diagnosed with lung cancer 12/2021 and received both chemotherapy and radiation treatment. Diane [name] reports this was a hard time for Diane [name] and Diane [name] was prescribed xanax and took it for several months. Diane [name] reports that Diane [name] no longer is taking xanax and that Diane [name] no longer feels like Diane [name] has depression or anxiety. Diane [name] takes hydroxyzine for insomnia. Diane [name] reports that Diane [name] has always had problems with Diane [name] sleep. Diane [name] has been referred for a sleep study to see if OSA plays a role in this, but Diane [name] has not been called yet by the sleep center to be scheduled. Diane [name] has no other  identifiable psychosocial issues. Diane [name] scored a 2 on Diane [name] PHQ-9 and this is due to Diane [name] sleep and Diane [name] lack of concentration at times. Diane [name] reports that Diane [name] has a good support system with Diane [name] husband, children, step children, and grand children. 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 Diane [name] is planning to retire 10/16/2022 and plans on traveling abroad. Diane [name] goals while in the program are to decrease Diane [name] SOB with exertion. Diane [name] is eager to start the program to get herself to "110%" so Diane [name] can travel next year. Diane [name] has a history of depression and anxiety, and Diane [name] also has current sleep concerns. Diane [name] was diagnosed with lung cancer 12/2021 and received both  chemotherapy and radiation treatment. Diane [name] reports this was a hard time for Diane [name] and Diane [name] was prescribed xanax and took it for several months. Diane [name] reports that Diane [name] no longer is taking xanax and that Diane [name] no longer feels like Diane [name] has depression or anxiety. Diane [name] takes hydroxyzine for insomnia. Diane [name] reports that Diane [name] has always had problems with Diane [name] sleep. Diane [name] has been referred for a sleep study to see if OSA plays a role in this, but Diane [name] has not been called yet by the sleep center to be scheduled. Diane [name] has no other identifiable psychosocial issues. Diane [name] scored a 2 on Diane [name] PHQ-9 and this is due to Diane [name] sleep and Diane [name] lack of concentration at times. Diane [name] reports that Diane [name] has a good support system with Diane [name] husband, children, step children, and grand children. 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 Diane [name] is planning to retire 10/16/2022 and plans on traveling abroad. Diane [name] goals while in the program are to decrease Diane [name] SOB with exertion. Diane [name] is eager to start the program to get herself to "110%" so Diane [name] can travel next year. (P)  Diane [name] has a history of depression and anxiety, and reports these issues as resolved.  Diane [name] was diagnosed with lung cancer 12/2021 and received both chemotherapy and radiation treatment.  Also has current sleep concerns but takes hydroxyzine for insomnia.  Has no identifiable psychosocial issues. Has a good support system with Diane [name] husband, children, step-children, and grand-children.  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 Diane [name] goal while in the program is to decrease Diane [name] SOB with exertion and Diane [name] is tolerating the program well and enjoys socializing with clients and staff.     Expected Outcomes Diane [name]'s sleep concerns will continue to be treated and Diane [name] will have no other identifiable psychosocial issues. -- Diane [name]'s sleep concerns will continue to be treated and Diane [name] will have no other identifiable psychosocial  issues.     Interventions Encouraged to attend Pulmonary Rehabilitation for the exercise -- Encouraged to attend Pulmonary Rehabilitation for the exercise     Continue Psychosocial Services  No Follow up required -- No Follow up required              Psychosocial Discharge (Final Psychosocial Re-Evaluation):  Psychosocial Re-Evaluation - 09/12/22 0830       Psychosocial Re-Evaluation   Comments Diane [name] has a history of depression and anxiety, and reports these issues as resolved.  Diane [name] was diagnosed with lung cancer 12/2021 and received both chemotherapy and radiation treatment.  Also has current sleep concerns but takes hydroxyzine for insomnia.  Has no identifiable psychosocial issues. Has a good support system with Diane [name] husband, children, step-children, and grand-children.  Currently works as a Scientist, forensic for  [employer] and is planning to retire 10/16/2022 and plans on traveling abroad. Diane [name] goal while in the program is to decrease Diane [name] SOB with exertion and Diane [name] is tolerating the program well and enjoys socializing with clients and staff.    Expected Outcomes Diane [name]'s sleep concerns will continue to be treated and Diane [name] will have no other identifiable psychosocial issues.    Interventions Encouraged to attend Pulmonary Rehabilitation for the exercise    Continue Psychosocial Services  No Follow up required              Education: Education Goals: Education classes will be provided on a weekly basis, covering required topics. Participant will state understanding/return demonstration of topics presented.  Learning Barriers/Preferences:  Learning Barriers/Preferences - 07/25/22 0858       Learning Barriers/Preferences   Learning Barriers None    Learning Preferences Skilled Demonstration             Education Topics: How Lungs Work and Diseases: - Discuss the anatomy of the lungs and diseases that can affect the lungs, such as COPD.   Exercise: -Discuss the  importance of exercise, FITT principles of exercise, normal and abnormal responses to exercise, and how to exercise safely.   Environmental Irritants: -Discuss types of environmental irritants and how to limit exposure to environmental irritants. Flowsheet Row PULMONARY REHAB CHRONIC OBSTRUCTIVE PULMONARY DISEASE from 09/14/2022 in [place]  Date 07/27/22  Educator Hj  Instruction Review Code 1- Verbalizes Understanding       Meds/Inhalers and oxygen: - Discuss respiratory medications, definition of an inhaler and oxygen, and the proper way to use an inhaler and oxygen. Flowsheet Row PULMONARY REHAB CHRONIC OBSTRUCTIVE PULMONARY DISEASE from 09/14/2022 in [place]  Date 08/03/22  Educator HJ       Energy Saving Techniques: - Discuss methods to conserve energy and decrease shortness of breath when performing activities of daily living.  Flowsheet Row PULMONARY REHAB CHRONIC OBSTRUCTIVE PULMONARY DISEASE from 09/14/2022 in [place]  Date 08/10/22  Educator HB  Instruction Review Code 1- Verbalizes Understanding       Bronchial Hygiene / Breathing Techniques: - Discuss breathing mechanics, pursed-lip breathing technique,  proper posture, effective ways to clear airways, and other functional breathing techniques Flowsheet Row PULMONARY REHAB CHRONIC OBSTRUCTIVE PULMONARY DISEASE from 09/14/2022 in [place]  Date 08/17/22  Educator HB  Instruction Review Code 1- Research scientist (medical): - Provides group verbal and written instruction about the health risks of elevated stress, cause of high stress, and healthy ways to reduce stress. Flowsheet Row PULMONARY REHAB CHRONIC OBSTRUCTIVE PULMONARY DISEASE from 09/14/2022 in [place]  Date 08/24/22  Educator HB  Instruction Review Code 1- Verbalizes Understanding       Nutrition  I: Fats: - Discuss the types of cholesterol, what cholesterol does to the body, and how cholesterol levels can be controlled.   Nutrition II: Labels: -Discuss the different components of food labels and how to read food labels. Flowsheet Row PULMONARY REHAB CHRONIC OBSTRUCTIVE PULMONARY DISEASE from 09/14/2022 in [place]  Date 09/14/22  Educator HB  Instruction Review Code 1- Verbalizes Understanding       Respiratory Infections: - Discuss the signs and symptoms of respiratory infections, ways to prevent respiratory infections, and the importance of seeking medical treatment when having a respiratory infection.   Stress I: Signs and Symptoms: - Discuss the causes of  stress, how stress may lead to anxiety and depression, and ways to limit stress.   Stress II: Relaxation: -Discuss relaxation techniques to limit stress.   Oxygen for Home/Travel: - Discuss how to prepare for travel when on oxygen and proper ways to transport and store oxygen to ensure safety.   Knowledge Questionnaire Score:  Knowledge Questionnaire Score - 07/25/22 0826       Knowledge Questionnaire Score   Pre Score 5/18             Core Components/Risk Factors/Patient Goals at Admission:  Personal Goals and Risk Factors at Admission - 07/25/22 0908       Core Components/Risk Factors/Patient Goals on Admission   Improve shortness of breath with ADL's Yes    Intervention Provide education, individualized exercise plan and daily activity instruction to help decrease symptoms of SOB with activities of daily living.    Expected Outcomes Short Term: Improve cardiorespiratory fitness to achieve a reduction of symptoms when performing ADLs;Long Term: Be able to perform more ADLs without symptoms or delay the onset of symptoms             Core Components/Risk Factors/Patient Goals Review:   Goals and Risk Factor Review     Row Name 09/12/22 0849             Core  Components/Risk Factors/Patient Goals Review   Personal Goals Review Increase knowledge of respiratory medications and ability to use respiratory devices properly.;Develop more efficient breathing techniques such as purse lipped breathing and diaphragmatic breathing and practicing self-pacing with activity.;Improve shortness of breath with ADL's       Review [place] uses oxygen while on treadmill.  Oxygen at 2-3 liters on facility oxygen.  However, Diane [name] used Diane [name] O2 from home once and needed 5 liters, Sats averaging about 94-95%.  Diane [name] is on Room air when using the NuStep, Sats are 90-98%.  Blood pressure has been stable.  Patient is consistent with attendance and enjoys socializing with class members and staff.  Has no current issues with psychosocial issues.       Expected Outcomes Improve cardiorespiratory fitness to achieve a reduction of SOB symptoms.  Attend PR twice/week and continue exercises at home.                Core Components/Risk Factors/Patient Goals at Discharge (Final Review):   Goals and Risk Factor Review - 09/12/22 0849       Core Components/Risk Factors/Patient Goals Review   Personal Goals Review Increase knowledge of respiratory medications and ability to use respiratory devices properly.;Develop more efficient breathing techniques such as purse lipped breathing and diaphragmatic breathing and practicing self-pacing with activity.;Improve shortness of breath with ADL's    Review [place] uses oxygen while on treadmill.  Oxygen at 2-3 liters on facility oxygen.  However, Diane [name] used Diane [name] O2 from home once and needed 5 liters, Sats averaging about 94-95%.  Diane [name] is on Room air when using the NuStep, Sats are 90-98%.  Blood pressure has been stable.  Patient is consistent with attendance and enjoys socializing with class members and staff.  Has no current issues with psychosocial issues.    Expected Outcomes Improve cardiorespiratory fitness to achieve a reduction of SOB symptoms.  Attend PR  twice/week and continue exercises at home.             ITP Comments:   Comments: ITP REVIEW Diane [name] is making expected progress toward pulmonary rehab goals after completing 12 sessions. Recommend continued exercise, life  style modification, education, and utilization of breathing techniques to increase stamina and strength and decrease shortness of breath with exertion.

## 2022-09-21 ENCOUNTER — Encounter (HOSPITAL_COMMUNITY)
Admission: RE | Admit: 2022-09-21 | Discharge: 2022-09-21 | Disposition: A | Discharge status: Home / Self Care | Payer: 59 | Source: Ambulatory Visit | Attending: Pulmonary Disease | Admitting: Pulmonary Disease

## 2022-09-21 DIAGNOSIS — J479 Bronchiectasis, uncomplicated: Secondary | ICD-10-CM | POA: Diagnosis not present

## 2022-09-21 DIAGNOSIS — J449 Chronic obstructive pulmonary disease, unspecified: Secondary | ICD-10-CM

## 2022-09-21 NOTE — Progress Notes (Signed)
Daily Session Note  Patient Details  Name: Diane [name] MRN: [id number] Date of Birth: [birth year] Referring Provider:   Flowsheet Row PULMONARY REHAB COPD ORIENTATION from 07/25/2022 in [place]  Referring Provider Dr. Valeta Harms       Encounter Date: 09/21/2022  Check In:  Session Check In - 09/21/22 1045       Check-In   Supervising physician immediately available to respond to emergencies [facility] MD immediately available    Physician(s) Dr. Harl Bowie    Location [facility]    Staff Present Leana Roe, BS, Exercise Physiologist;[place] Sherrie George, MS, ACSM-CEP    Virtual Visit No    Medication changes reported     No    Fall or balance concerns reported    No    Tobacco Cessation No Change    Warm-up and Cool-down Performed as group-led instruction    Resistance Training Performed Yes    VAD Patient? No    PAD/SET Patient? No      Pain Assessment   Currently in Pain? No/denies    Multiple Pain Sites No             Capillary Blood Glucose: No results found for this or any previous visit (from the past 24 hour(s)).    Social History   Tobacco Use  Smoking Status Former   Packs/day: 0.50   Years: 30.00   Total pack years: 15.00   Types: Cigarettes   Quit date: 10/09/2021   Years since quitting: 0.9  Smokeless Tobacco Never  Tobacco Comments   5-10 cigarettes a day (plans to quit prior to this surgery 05/06/15)    Goals Met:  Proper associated with RPD/PD & O2 Sat Independence with exercise equipment Using PLB without cueing & demonstrates good technique Exercise tolerated well Queuing for purse lip breathing No report of concerns or symptoms today Strength training completed today  Goals Unmet:  Not Applicable  Comments: checkout time is 1145   Dr. Kathie Dike is Medical Director for [facility].

## 2022-09-26 ENCOUNTER — Encounter (HOSPITAL_COMMUNITY): Payer: 59

## 2022-09-26 ENCOUNTER — Ambulatory Visit (HOSPITAL_COMMUNITY)
Admission: RE | Admit: 2022-09-26 | Discharge: 2022-09-26 | Disposition: A | Discharge status: Home / Self Care | Payer: 59 | Source: Ambulatory Visit | Attending: Internal Medicine | Admitting: Internal Medicine

## 2022-09-26 ENCOUNTER — Inpatient Hospital Stay: Payer: 59 | Attending: Internal Medicine

## 2022-09-26 DIAGNOSIS — Z9221 Personal history of antineoplastic chemotherapy: Secondary | ICD-10-CM | POA: Insufficient documentation

## 2022-09-26 DIAGNOSIS — R0609 Other forms of dyspnea: Secondary | ICD-10-CM | POA: Insufficient documentation

## 2022-09-26 DIAGNOSIS — C3490 Malignant neoplasm of unspecified part of unspecified bronchus or lung: Secondary | ICD-10-CM

## 2022-09-26 DIAGNOSIS — Z85118 Personal history of other malignant neoplasm of bronchus and lung: Secondary | ICD-10-CM | POA: Insufficient documentation

## 2022-09-26 DIAGNOSIS — Z923 Personal history of irradiation: Secondary | ICD-10-CM | POA: Insufficient documentation

## 2022-09-26 LAB — CBC WITH DIFFERENTIAL (CANCER CENTER ONLY)
Abs Immature Granulocytes: 0.04 10*3/uL (ref 0.00–0.07)
Basophils Absolute: 0 10*3/uL (ref 0.0–0.1)
Basophils Relative: 1 %
Eosinophils Absolute: 0.3 10*3/uL (ref 0.0–0.5)
Eosinophils Relative: 4 %
HCT: 36.8 % (ref 36.0–46.0)
Hemoglobin: 12.6 g/dL (ref 12.0–15.0)
Immature Granulocytes: 1 %
Lymphocytes Relative: 18 %
Lymphs Abs: 1.2 10*3/uL (ref 0.7–4.0)
MCH: 32.2 pg (ref 26.0–34.0)
MCHC: 34.2 g/dL (ref 30.0–36.0)
MCV: 94.1 fL (ref 80.0–100.0)
Monocytes Absolute: 0.5 10*3/uL (ref 0.1–1.0)
Monocytes Relative: 8 %
Neutro Abs: 4.4 10*3/uL (ref 1.7–7.7)
Neutrophils Relative %: 68 %
Platelet Count: 303 10*3/uL (ref 150–400)
RBC: 3.91 MIL/uL (ref 3.87–5.11)
RDW: 12.4 % (ref 11.5–15.5)
WBC Count: 6.4 10*3/uL (ref 4.0–10.5)
nRBC: 0 % (ref 0.0–0.2)

## 2022-09-26 LAB — CMP (CANCER CENTER ONLY)
ALT: 13 U/L (ref 0–44)
AST: 20 U/L (ref 15–41)
Albumin: 4 g/dL (ref 3.5–5.0)
Alkaline Phosphatase: 72 U/L (ref 38–126)
Anion gap: 7 (ref 5–15)
BUN: 12 mg/dL (ref 8–23)
CO2: 28 mmol/L (ref 22–32)
Calcium: 9.8 mg/dL (ref 8.9–10.3)
Chloride: 102 mmol/L (ref 98–111)
Creatinine: 0.65 mg/dL (ref 0.44–1.00)
GFR, Estimated: >60 mL/min (ref >60)
Glucose, Bld: 99 mg/dL (ref 70–99)
Potassium: 4.1 mmol/L (ref 3.5–5.1)
Sodium: 137 mmol/L (ref 135–145)
Total Bilirubin: 0.4 mg/dL (ref 0.3–1.2)
Total Protein: 7.8 g/dL (ref 6.5–8.1)

## 2022-09-28 ENCOUNTER — Inpatient Hospital Stay (HOSPITAL_BASED_OUTPATIENT_CLINIC_OR_DEPARTMENT_OTHER): Payer: 59 | Admitting: Internal Medicine

## 2022-09-28 ENCOUNTER — Encounter (HOSPITAL_COMMUNITY): Payer: 59

## 2022-09-28 VITALS — BP 141/83 | HR 96 | Temp 98.3°F | Resp 20 | Ht 65.0 in | Wt 150.5 lb

## 2022-09-28 DIAGNOSIS — Z9221 Personal history of antineoplastic chemotherapy: Secondary | ICD-10-CM | POA: Diagnosis not present

## 2022-09-28 DIAGNOSIS — Z85118 Personal history of other malignant neoplasm of bronchus and lung: Secondary | ICD-10-CM | POA: Diagnosis not present

## 2022-09-28 DIAGNOSIS — Z923 Personal history of irradiation: Secondary | ICD-10-CM | POA: Diagnosis not present

## 2022-09-28 DIAGNOSIS — C3490 Malignant neoplasm of unspecified part of unspecified bronchus or lung: Secondary | ICD-10-CM

## 2022-09-28 NOTE — Progress Notes (Signed)
[facility] [phone]   [phone]  OFFICE PROGRESS NOTE  Glenda Chroman, MD [place] [address]  DIAGNOSIS: Unresectable stage IIB (T2a, N1, M0) non-small cell lung cancer, squamous cell carcinoma presented with right lower lobe lung mass in addition to right hilar lymphadenopathy diagnosed in March 2023.    The patient had PD-L1 expression of 99%.  PRIOR THERAPY: A course of concurrent chemoradiation with weekly carboplatin for AUC of 2 and paclitaxel 45 Mg/M2.  First dose started 01/16/2022.  Status post 7 cycles.  Last dose was given on 02/27/2022.  CURRENT THERAPY: Observation.  INTERVAL HISTORY: Diane [name] 66 y.o. female returns to the clinic today for follow-up visit accompanied by her husband.  The patient continues to complain of increasing fatigue and weakness as well as shortness of breath at baseline increased with exertion.  She has a history of interstitial lung disease and pulmonary fibrosis and was followed by Dr. Valeta Harms and now being referred to Dr. Chase Caller for evaluation of her condition.  She has no chest pain but has cough with no hemoptysis.  She has no nausea, vomiting, diarrhea or constipation.  She has no headache or visual changes.  She has no recent weight loss or night sweats.  The patient had repeat CT scan of the chest performed recently and she is here for evaluation and discussion of her scan results.  MEDICAL HISTORY: Past Medical History:  Diagnosis Date   Anxiety    Arthritis    fingers, left foot   C. difficile diarrhea 2015   COPD (chronic obstructive pulmonary disease) (HCC)    Depression    Eczema    H pylori ulcer    Headache(784.0)    Hypercholesterolemia    Hypertension    Kidney stones    20 years ago   Pneumonia    5 years ago   Seizures ([place])    2 years ago, "cluster of seizures" none since   Shingles     ALLERGIES:  is allergic to alpha-gal, other, penicillins, and  prozac [fluoxetine].  MEDICATIONS:  Current Outpatient Medications  Medication Sig Dispense Refill   ALPRAZolam (XANAX) 0.5 MG tablet Take 0.5 mg by mouth daily as needed. (Patient not taking: Reported on 08/08/2022)     cetirizine (ZYRTEC) 10 MG tablet Take 10 mg by mouth at bedtime.     doxycycline (VIBRA-TABS) 100 MG tablet Take 1 tablet (100 mg total) by mouth 2 (two) times daily. 14 tablet 0   EPINEPHrine (AUVI-Q) 0.3 mg/0.3 mL IJ SOAJ injection Inject 0.3 mLs (0.3 mg total) into the muscle as needed for anaphylaxis. 2 each 1   fluticasone (FLONASE) 50 MCG/ACT nasal spray Place 2 sprays into both nostrils daily. (Patient taking differently: Place 2 sprays into both nostrils daily as needed for allergies.) 16 g 5   hydrochlorothiazide (MICROZIDE) 12.5 MG capsule Take 12.5 mg by mouth daily.     hydrOXYzine (ATARAX) 25 MG tablet Take 25 mg by mouth daily.     omeprazole (PRILOSEC) 20 MG capsule Take 1 capsule (20 mg total) by mouth 2 (two) times daily. (Needs to be seen before next refill) 60 capsule 0   OVER THE COUNTER MEDICATION Take 1 capsule by mouth daily. Probiotic     predniSONE (DELTASONE) 10 MG tablet Prednisone taper; 10 mg tablets: 4 tabs x 2 days, 3 tabs x 2 days, 2 tabs x 2 days 1 tab x 2 days then stop.  20 tablet 0   rosuvastatin (CRESTOR) 10 MG tablet Take 10 mg by mouth at bedtime.     umeclidinium-vilanterol (ANORO ELLIPTA) 62.5-25 MCG/ACT AEPB Inhale 1 puff into the lungs daily. 60 each 3   No current facility-administered medications for this visit.    SURGICAL HISTORY:  Past Surgical History:  Procedure Laterality Date   ABDOMINAL HYSTERECTOMY     ADENOIDECTOMY     APPENDECTOMY     BRONCHIAL BIOPSY  12/20/2021   Procedure: BRONCHIAL BIOPSIES;  Surgeon: Garner Nash, DO;  Location: [facility];  Service: Pulmonary;;   BRONCHIAL BRUSHINGS  12/20/2021   Procedure: BRONCHIAL BRUSHINGS;  Surgeon: Garner Nash, DO;  Location: [facility];  Service: Pulmonary;;    BRONCHIAL NEEDLE ASPIRATION BIOPSY  12/20/2021   Procedure: BRONCHIAL NEEDLE ASPIRATION BIOPSIES;  Surgeon: Garner Nash, DO;  Location: [facility];  Service: Pulmonary;;   CESAREAN SECTION     CHOLECYSTECTOMY     COLONOSCOPY  06/10/2012   RMR: Colonic polyps -removed as described above.    FINE NEEDLE ASPIRATION  12/20/2021   Procedure: FINE NEEDLE ASPIRATION (FNA) LINEAR;  Surgeon: Garner Nash, DO;  Location: [facility];  Service: Pulmonary;;   KNEE ARTHROSCOPY  1973   right knee,  torn cart   LUMBAR DISC SURGERY  05/12/2015   L4 L5   LUMBAR LAMINECTOMY/DECOMPRESSION MICRODISCECTOMY Left 05/12/2015   Procedure: Left L4-5 Microdiscectomy;  Surgeon: Marybelle Killings, MD;  Location: [facility];  Service: Orthopedics;  Laterality: Left;   TONSILLECTOMY     VIDEO BRONCHOSCOPY WITH ENDOBRONCHIAL ULTRASOUND Bilateral 12/20/2021   Procedure: VIDEO BRONCHOSCOPY WITH ENDOBRONCHIAL ULTRASOUND;  Surgeon: Garner Nash, DO;  Location: [facility];  Service: Pulmonary;  Laterality: Bilateral;   VIDEO BRONCHOSCOPY WITH RADIAL ENDOBRONCHIAL ULTRASOUND  12/20/2021   Procedure: RADIAL ENDOBRONCHIAL ULTRASOUND;  Surgeon: Garner Nash, DO;  Location: [facility];  Service: Pulmonary;;    REVIEW OF SYSTEMS:  Constitutional: positive for fatigue Eyes: negative Ears, nose, mouth, throat, and face: negative Respiratory: positive for cough and dyspnea on exertion Cardiovascular: negative Gastrointestinal: negative Genitourinary:negative Integument/breast: negative Hematologic/lymphatic: negative Musculoskeletal:negative Neurological: negative Behavioral/Psych: negative Endocrine: negative Allergic/Immunologic: negative   PHYSICAL EXAMINATION: General appearance: alert, cooperative, fatigued, and no distress Head: Normocephalic, without obvious abnormality, atraumatic Neck: no adenopathy, no JVD, supple, symmetrical, trachea midline, and thyroid not enlarged, symmetric, no  tenderness/mass/nodules Lymph nodes: Cervical, supraclavicular, and axillary nodes normal. Resp: rales bilaterally Back: symmetric, no curvature. ROM normal. No CVA tenderness. Cardio: regular rate and rhythm, S1, S2 normal, no murmur, click, rub or gallop GI: soft, non-tender; bowel sounds normal; no masses,  no organomegaly Extremities: extremities normal, atraumatic, no cyanosis or edema Neurologic: Alert and oriented X 3, normal strength and tone. Normal symmetric reflexes. Normal coordination and gait  ECOG PERFORMANCE STATUS: 1 - Symptomatic but completely ambulatory  Blood pressure (!) 141/83, pulse 96, temperature 98.3 F (36.8 C), resp. rate 20, height _0  (1.651 m), weight 150 lb 8 oz (68.3 kg), SpO2 90 %.  LABORATORY DATA: Lab Results  Component Value Date   WBC 6.4 09/26/2022   HGB 12.6 09/26/2022   HCT 36.8 09/26/2022   MCV 94.1 09/26/2022   PLT 303 09/26/2022      Chemistry      Component Value Date/Time   NA 137 09/26/2022 0953   NA 139 12/03/2019 0928   K 4.1 09/26/2022 0953   CL 102 09/26/2022 0953   CO2 28 09/26/2022 0953   BUN 12 09/26/2022  0953   BUN 14 12/03/2019 0928   CREATININE 0.65 09/26/2022 0953   CREATININE 0.64 10/14/2021 0953      Component Value Date/Time   CALCIUM 9.8 09/26/2022 0953   ALKPHOS 72 09/26/2022 0953   AST 20 09/26/2022 0953   ALT 13 09/26/2022 0953   BILITOT 0.4 09/26/2022 0953       RADIOGRAPHIC STUDIES: CT CHEST HIGH RESOLUTION  Result Date: 09/27/2022 CLINICAL DATA:  66 year old female with history of dyspnea on exertion. History of lung cancer. Evaluate for interstitial lung disease. * Tracking Code: BO * EXAM: CT CHEST WITHOUT CONTRAST TECHNIQUE: Multidetector CT imaging of the chest was performed following the standard protocol without intravenous contrast. High resolution imaging of the lungs, as well as inspiratory and expiratory imaging, was performed. RADIATION DOSE REDUCTION: This exam was performed according  to the departmental dose-optimization program which includes automated exposure control, adjustment of the mA and/or kV according to patient size and/or use of iterative reconstruction technique. COMPARISON:  Chest CT 06/27/2022. FINDINGS: Cardiovascular: Heart size is normal. There is no significant pericardial fluid, thickening or pericardial calcification. There is aortic atherosclerosis, as well as atherosclerosis of the great vessels of the mediastinum and the coronary arteries, including calcified atherosclerotic plaque in the left main, left anterior descending and right coronary arteries. Mediastinum/Nodes: No pathologically enlarged mediastinal or left hilar lymph nodes. Soft tissue fullness in the right hilar region concerning for underlying lymphadenopathy, but poorly evaluated on today's noncontrast CT examination (best appreciated on axial image 60 of series 2). Esophagus is unremarkable in appearance. No axillary lymphadenopathy. Lungs/Pleura: Treated lesion in the right lower lobe is now partially obscured by surrounding parenchymal changes, but is estimated to measure approximately 2.1 x 1.4 cm (axial image 114 of series 4). There has been a dramatic increase in surrounding areas of ground-glass attenuation, septal thickening, bronchiectasis and cystic changes, and regional architectural distortion in the surrounding lung parenchyma, presumably reflective of evolving postradiation changes. Several nodular areas of architectural distortion are now noted in the right lung, most notably a macrolobulated nodule with spiculated margins in the right upper lobe (axial image 86 of series 4) measuring 1.9 x 1.3 cm, and a slightly ill-defined 1.9 x 1.1 cm nodular area of architectural distortion in the right lower lobe at the base (axial image 185 of series 4). Small right pleural effusion predominantly lying dependently. No left pleural effusion. In the remaining portions of the lungs are widespread areas of  septal thickening, subpleural reticulation, traction bronchiectasis, peripheral bronchiolectasis and honeycombing, most evident in the mid to lower lung distribution. Inspiratory and expiratory imaging is unremarkable. There is also a background of mild diffuse bronchial wall thickening with moderate centrilobular and paraseptal emphysema. Upper Abdomen: Aortic atherosclerosis.  Status post cholecystectomy. Musculoskeletal: There are no aggressive appearing lytic or blastic lesions noted in the visualized portions of the skeleton. IMPRESSION: 1. The appearance of the lungs is compatible with a combination of emphysema and interstitial lung disease. The interstitial lung component is considered diagnostic of usual interstitial pneumonia (UIP) per current ATS guidelines, progressive compared to remote prior examinations, but stable compared to the most recent prior study. 2. Evolving postradiation changes in the right lung. Treated nodule in the superior segment of the right lower lobe appears grossly similar to the prior study, while there are new nodules in the right lung, most concerning of which is in the right upper lobe (axial image 86 of series 4), currently measuring 1.9 x 1.3 cm. Close attention on  follow-up studies is recommended. These new areas of nodularity could be treatment related or infectious or inflammatory in etiology, however, neoplasm is not excluded. 3. Mild diffuse bronchial wall thickening with moderate centrilobular and paraseptal emphysema; imaging findings suggestive of underlying COPD. 4. Aortic atherosclerosis, in addition to left main and 2 vessel coronary artery disease. Please note that although the presence of coronary artery calcium documents the presence of coronary artery disease, the severity of this disease and any potential stenosis cannot be assessed on this non-gated CT examination. Assessment for potential risk factor modification, dietary therapy or pharmacologic therapy may  be warranted, if clinically indicated. Aortic Atherosclerosis (ICD10-I70.0) and Emphysema (ICD10-J43.9). Electronically Signed   By: Vinnie Langton M.D.   On: 09/27/2022 07:43    ASSESSMENT AND PLAN: This is a very pleasant 66  years old white female diagnosed with unresectable stage IIb (T2 a, N1, M0) non-small cell lung cancer, squamous cell carcinoma with right lower lobe lung mass in addition to right hilar lymphadenopathy in March 2023. The patient has PD-L1 expression of 99%. She completed a course of concurrent chemoradiation with weekly carboplatin for AUC of 2 and paclitaxel 45 Mg/M2 status post 7 cycles.  Last dose was given on 02/27/2022. The patient is currently on observation and she is feeling fine except for the baseline shortness of breath and fatigue. She had repeat CT scan of the chest performed recently.  I personally and independently reviewed the scan images and discussed the result with the patient and her husband. Her scan showed finding compatible with a combination of emphysema and interstitial lung disease which has been progressive.  She also had evolving postradiation changes in the right lung and the treated nodule in the superior segment of the right lower lobe appeared grossly similar but there was new nodules within the right lung most concerning of which is in the right upper lobe and currently measures 1.9 x 1.3 cm requiring close monitoring. I had a lengthy discussion with the patient and her husband about her current condition and further investigation to rule out any disease recurrence. I gave the patient the option of close monitoring and repeat CT scan of the chest in 2-3 months versus proceeding with the PET scan for further evaluation of her condition and to rule out disease recurrence.  The patient is very anxious and she would like to have the PET scan done as soon as possible. I will arrange for her to come back for follow-up visit in around 2 weeks with repeat  PET scan for further evaluation of her condition. The patient was advised to call immediately if she has any other concerning symptoms in the interval.  The patient voices understanding of current disease status and treatment options and is in agreement with the current care plan.  All questions were answered. The patient knows to call the clinic with any problems, questions or concerns. We can certainly see the patient much sooner if necessary.  The total time spent in the appointment was 30 minutes.  Disclaimer: This note was dictated with voice recognition software. Similar sounding words can inadvertently be transcribed and may not be corrected upon review.

## 2022-09-29 ENCOUNTER — Encounter: Payer: Self-pay | Admitting: Acute Care

## 2022-09-29 ENCOUNTER — Ambulatory Visit (INDEPENDENT_AMBULATORY_CARE_PROVIDER_SITE_OTHER): Payer: 59 | Admitting: Acute Care

## 2022-09-29 VITALS — BP 126/80 | HR 87 | Ht 65.0 in | Wt 150.2 lb

## 2022-09-29 DIAGNOSIS — J449 Chronic obstructive pulmonary disease, unspecified: Secondary | ICD-10-CM

## 2022-09-29 DIAGNOSIS — R911 Solitary pulmonary nodule: Secondary | ICD-10-CM

## 2022-09-29 DIAGNOSIS — Z85118 Personal history of other malignant neoplasm of bronchus and lung: Secondary | ICD-10-CM | POA: Diagnosis not present

## 2022-09-29 DIAGNOSIS — J849 Interstitial pulmonary disease, unspecified: Secondary | ICD-10-CM | POA: Diagnosis not present

## 2022-09-29 DIAGNOSIS — R9389 Abnormal findings on diagnostic imaging of other specified body structures: Secondary | ICD-10-CM

## 2022-09-29 LAB — SEDIMENTATION RATE: Sed Rate: 47 mm/hr — ABNORMAL HIGH (ref 0–30)

## 2022-09-29 NOTE — Progress Notes (Unsigned)
History of Present Illness Diane [name] is a 66 y.o. female female former smoker diagnosed with unresectable stage IIb (T2 a, N1, M0) non-small cell lung cancer, squamous cell carcinoma with right lower lobe lung mass in addition to right hilar lymphadenopathy in March 2023.She underwent concurrent chemo-radiation, last dosing 02/27/2022. She is followed by pulmonary for her bronchiectasis and emphysema.  She is followed by Dr. Valeta Harms.  I will refer her to Dr. Chase Caller for her ILD.   09/29/2022 Pt.presents for follow up. She was last seen 08/08/2022 for worsening dyspnea after Covid 05/2022. She was initially treated for a COPD flare 07/14/2022 with Doxycycline and pred taper. Her discolored secretions cleared, but she continued to have oxygen desaturations at Pulmonary rehab, as low as 85%. Her CT scan done 06/28/2022 showed subpleural reticulation and traction bronchiectasis.She had crackles on exam. I communicated with Dr. Julien Nordmann medical oncology, and he agreed to allow me to change his follow up CT Chest in 09/2022 to an HRCT so we could better evaluate for ILD. Scan was done 09/27/2022 and it confirmed ILD , UIP, progressive compared to prior exams, but stable compared to most recent study. We got her home  oxygen ordered 08/08/2022. She understands goal is for oxygen to be > 88% at all times.  She has POC and home concentrator. She is monitoring her oxygen saturations . I have spoken with Dr. Chase Caller, and we will start patient on Esbriet ASAP. He will get her onto his schedule within the next 6-8 weeks. We have provided the patient with paperwork for drug ( usually takes 2 weeks toi get approved and mailed) and the patient questionnaire for ILD. I will also draw ANA, ANCA screen reflex titer, angiotensin-converting enzyme, anti-DNA antibody double-stranded, antiscleroderma antibody, CCP, rheumatoid factor, ESR.  This will give Dr. Chase Caller information to work with when he sees her in 21 to 1  weeks.  Patient and her husband both verbalized understanding of the above and are anxious to go ahead and get started on antifibrotic therapy. Patient's scan was also concerning for notation of a macrolobulated nodule with spiculated margins in the right upper lobe measuring 1.9 x 1.3 cm and a slightly ill-defined 1.9 x 1.1 cm nodular area in the right lower lobe at the base.These new areas of nodularity could be treatment related or infectious or inflammatory in etiology, however, neoplasm is not excluded.  Dr. Julien Nordmann reviewed this with her yesterday and he has ordered a PET scan to further evaluate. This is scheduled for 10/12/2022.  Home sleep study ordered 07/14/2022 has still not been done.  I have checked with PCC's and they have called the patient to get her scheduled.  She will need follow-up after this sleep study has been completed.  Test Results: HRCT 09/27/2022 The appearance of the lungs is compatible with a combination of emphysema and interstitial lung disease. The interstitial lung component is considered diagnostic of usual interstitial pneumonia (UIP) per current ATS guidelines, progressive compared to remote prior examinations, but stable compared to the most recent prior study. 2. Evolving postradiation changes in the right lung. Treated nodule in the superior segment of the right lower lobe appears grossly similar to the prior study, while there are new nodules in the right lung, most concerning of which is in the right upper lobe (axial image 86 of series 4), currently measuring 1.9 x 1.3 cm. Close attention on follow-up studies is recommended. These new areas of nodularity could be treatment related or infectious or inflammatory  in etiology, however, neoplasm is not excluded. 3. Mild diffuse bronchial wall thickening with moderate centrilobular and paraseptal emphysema; imaging findings suggestive of underlying COPD. 4. Aortic atherosclerosis, in addition to left main  and 2 vessel coronary artery disease. Please note that although the presence of coronary artery calcium documents the presence of coronary artery disease, the severity of this disease and any potential stenosis cannot be assessed on this non-gated CT examination. Assessment for potential risk factor modification, dietary therapy or pharmacologic therapy may be warranted, if clinically indicated.   Aortic Atherosclerosis (ICD10-I70.0) and Emphysema (ICD10-J43.9).       Latest Ref Rng & Units 09/26/2022    9:53 AM 06/27/2022   10:08 AM 03/23/2022   10:11 AM  CBC  WBC 4.0 - 10.5 K/uL 6.4  6.0  5.5   Hemoglobin 12.0 - 15.0 g/dL 12.6  13.4  12.0   Hematocrit 36.0 - 46.0 % 36.8  39.4  35.3   Platelets 150 - 400 K/uL 303  303  271        Latest Ref Rng & Units 09/26/2022    9:53 AM 06/27/2022   10:08 AM 03/23/2022   10:11 AM  BMP  Glucose 70 - 99 mg/dL 99  111  97   BUN 8 - 23 mg/dL _0 Creatinine 0.44 - 1.00 mg/dL 0.65  0.76  0.61   Sodium 135 - 145 mmol/L 137  140  137   Potassium 3.5 - 5.1 mmol/L 4.1  4.1  4.1   Chloride 98 - 111 mmol/L 102  102  100   CO2 22 - 32 mmol/L _1 Calcium 8.9 - 10.3 mg/dL 9.8  10.3  9.8     BNP No results found for: "BNP"  ProBNP No results found for: "PROBNP"  PFT    Component Value Date/Time   FEV1PRE 1.84 01/04/2022 0951   FEV1POST 1.91 01/04/2022 0951   FVCPRE 2.54 01/04/2022 0951   FVCPOST 2.69 01/04/2022 0951   TLC 3.94 01/04/2022 0951   DLCOUNC 9.15 01/04/2022 0951   PREFEV1FVCRT 73 01/04/2022 0951   PSTFEV1FVCRT 71 01/04/2022 0951    CT CHEST HIGH RESOLUTION  Result Date: 09/27/2022 CLINICAL DATA:  66 year old female with history of dyspnea on exertion. History of lung cancer. Evaluate for interstitial lung disease. * Tracking Code: BO * EXAM: CT CHEST WITHOUT CONTRAST TECHNIQUE: Multidetector CT imaging of the chest was performed following the standard protocol without intravenous contrast. High resolution  imaging of the lungs, as well as inspiratory and expiratory imaging, was performed. RADIATION DOSE REDUCTION: This exam was performed according to the departmental dose-optimization program which includes automated exposure control, adjustment of the mA and/or kV according to patient size and/or use of iterative reconstruction technique. COMPARISON:  Chest CT 06/27/2022. FINDINGS: Cardiovascular: Heart size is normal. There is no significant pericardial fluid, thickening or pericardial calcification. There is aortic atherosclerosis, as well as atherosclerosis of the great vessels of the mediastinum and the coronary arteries, including calcified atherosclerotic plaque in the left main, left anterior descending and right coronary arteries. Mediastinum/Nodes: No pathologically enlarged mediastinal or left hilar lymph nodes. Soft tissue fullness in the right hilar region concerning for underlying lymphadenopathy, but poorly evaluated on today's noncontrast CT examination (best appreciated on axial image 60 of series 2). Esophagus is unremarkable in appearance. No axillary lymphadenopathy. Lungs/Pleura: Treated lesion in the right lower lobe is now partially obscured by surrounding parenchymal changes, but is  estimated to measure approximately 2.1 x 1.4 cm (axial image 114 of series 4). There has been a dramatic increase in surrounding areas of ground-glass attenuation, septal thickening, bronchiectasis and cystic changes, and regional architectural distortion in the surrounding lung parenchyma, presumably reflective of evolving postradiation changes. Several nodular areas of architectural distortion are now noted in the right lung, most notably a macrolobulated nodule with spiculated margins in the right upper lobe (axial image 86 of series 4) measuring 1.9 x 1.3 cm, and a slightly ill-defined 1.9 x 1.1 cm nodular area of architectural distortion in the right lower lobe at the base (axial image 185 of series 4). Small  right pleural effusion predominantly lying dependently. No left pleural effusion. In the remaining portions of the lungs are widespread areas of septal thickening, subpleural reticulation, traction bronchiectasis, peripheral bronchiolectasis and honeycombing, most evident in the mid to lower lung distribution. Inspiratory and expiratory imaging is unremarkable. There is also a background of mild diffuse bronchial wall thickening with moderate centrilobular and paraseptal emphysema. Upper Abdomen: Aortic atherosclerosis.  Status post cholecystectomy. Musculoskeletal: There are no aggressive appearing lytic or blastic lesions noted in the visualized portions of the skeleton. IMPRESSION: 1. The appearance of the lungs is compatible with a combination of emphysema and interstitial lung disease. The interstitial lung component is considered diagnostic of usual interstitial pneumonia (UIP) per current ATS guidelines, progressive compared to remote prior examinations, but stable compared to the most recent prior study. 2. Evolving postradiation changes in the right lung. Treated nodule in the superior segment of the right lower lobe appears grossly similar to the prior study, while there are new nodules in the right lung, most concerning of which is in the right upper lobe (axial image 86 of series 4), currently measuring 1.9 x 1.3 cm. Close attention on follow-up studies is recommended. These new areas of nodularity could be treatment related or infectious or inflammatory in etiology, however, neoplasm is not excluded. 3. Mild diffuse bronchial wall thickening with moderate centrilobular and paraseptal emphysema; imaging findings suggestive of underlying COPD. 4. Aortic atherosclerosis, in addition to left main and 2 vessel coronary artery disease. Please note that although the presence of coronary artery calcium documents the presence of coronary artery disease, the severity of this disease and any potential stenosis  cannot be assessed on this non-gated CT examination. Assessment for potential risk factor modification, dietary therapy or pharmacologic therapy may be warranted, if clinically indicated. Aortic Atherosclerosis (ICD10-I70.0) and Emphysema (ICD10-J43.9). Electronically Signed   By: Vinnie Langton M.D.   On: 09/27/2022 07:43     Past medical hx Past Medical History:  Diagnosis Date   Anxiety    Arthritis    fingers, left foot   C. difficile diarrhea 2015   COPD (chronic obstructive pulmonary disease) (HCC)    Depression    Eczema    H pylori ulcer    Headache(784.0)    Hypercholesterolemia    Hypertension    Kidney stones    20 years ago   Pneumonia    5 years ago   Seizures ([place])    2 years ago, "cluster of seizures" none since   Shingles      Social History   Tobacco Use   Smoking status: Former    Packs/day: 0.50    Years: 30.00    Total pack years: 15.00    Types: Cigarettes    Quit date: 10/09/2021    Years since quitting: 0.9   Smokeless tobacco: Never  Tobacco comments:    5-10 cigarettes a day (plans to quit prior to this surgery 05/06/15)  Vaping Use   Vaping Use: Some days  Substance Use Topics   Alcohol use: Yes    Alcohol/week: 21.0 standard drinks of alcohol    Types: 21 Shots of liquor per week    Comment: 3 per day   Drug use: No    Ms.Bantz reports that she quit smoking about a year ago. Her smoking use included cigarettes. She has a 15.00 pack-year smoking history. She has never used smokeless tobacco. She reports current alcohol use of about 21.0 standard drinks of alcohol per week. She reports that she does not use drugs.  Tobacco Cessation: Former smoker Quit 10/09/2021) with a 30 pack year smoking history  Past surgical hx, Family hx, Social hx all reviewed.  Current Outpatient Medications on File Prior to Visit  Medication Sig   cetirizine (ZYRTEC) 10 MG tablet Take 10 mg by mouth at bedtime.   fluticasone (FLONASE) 50 MCG/ACT  nasal spray Place 2 sprays into both nostrils daily. (Patient taking differently: Place 2 sprays into both nostrils daily as needed for allergies.)   umeclidinium-vilanterol (ANORO ELLIPTA) 62.5-25 MCG/ACT AEPB Inhale 1 puff into the lungs daily.   ALPRAZolam (XANAX) 0.5 MG tablet Take 0.5 mg by mouth daily as needed. (Patient not taking: Reported on 08/08/2022)   doxycycline (VIBRA-TABS) 100 MG tablet Take 1 tablet (100 mg total) by mouth 2 (two) times daily.   EPINEPHrine (AUVI-Q) 0.3 mg/0.3 mL IJ SOAJ injection Inject 0.3 mLs (0.3 mg total) into the muscle as needed for anaphylaxis.   hydrochlorothiazide (MICROZIDE) 12.5 MG capsule Take 12.5 mg by mouth daily.   hydrOXYzine (ATARAX) 25 MG tablet Take 25 mg by mouth daily.   omeprazole (PRILOSEC) 20 MG capsule Take 1 capsule (20 mg total) by mouth 2 (two) times daily. (Needs to be seen before next refill)   OVER THE COUNTER MEDICATION Take 1 capsule by mouth daily. Probiotic   predniSONE (DELTASONE) 10 MG tablet Prednisone taper; 10 mg tablets: 4 tabs x 2 days, 3 tabs x 2 days, 2 tabs x 2 days 1 tab x 2 days then stop.   rosuvastatin (CRESTOR) 10 MG tablet Take 10 mg by mouth at bedtime.   No current facility-administered medications on file prior to visit.     Allergies  Allergen Reactions   Alpha-Gal     Other reaction(s): Abdominal Pain   Other Other (See Comments)   Penicillins     Unknown type of reaction   Prozac [Fluoxetine] Rash    Review Of Systems:  Constitutional:   No  weight loss, night sweats,  Fevers, chills, fatigue, or  lassitude.  HEENT:   No headaches,  Difficulty swallowing,  Tooth/dental problems, or  Sore throat,                No sneezing, itching, ear ache, nasal congestion, post nasal drip,   CV:  No chest pain,  Orthopnea, PND, swelling in lower extremities, anasarca, dizziness, palpitations, syncope.   GI  No heartburn, indigestion, abdominal pain, nausea, vomiting, diarrhea, change in bowel habits, loss  of appetite, bloody stools.   Resp: +  shortness of breath with exertion less  at rest.  No excess mucus, no productive cough,  No non-productive cough,  No coughing up of blood.  No change in color of mucus.  No wheezing.  No chest wall deformity  Skin: no rash or lesions.  GU:  no dysuria, change in color of urine, no urgency or frequency.  No flank pain, no hematuria   MS:  No joint pain or swelling.  No decreased range of motion.  No back pain.  Psych:  No change in mood or affect. No depression or anxiety.  No memory loss.   Vital Signs BP 126/80 (BP Location: Right Arm)   Pulse 87   Ht _0  (1.651 m)   Wt 150 lb 3.2 oz (68.1 kg)   SpO2 96%   BMI 24.99 kg/m    Physical Exam:  General- No distress,  A&Ox3 ENT: No sinus tenderness, TM clear, pale nasal mucosa, no oral exudate,no post nasal drip, no LAN Cardiac: S1, S2, regular rate and rhythm, no murmur Chest: No wheeze/ rales/ dullness; no accessory muscle use, no nasal flaring, no sternal retractions Abd.: Soft Non-tender Ext: No clubbing cyanosis, edema Neuro:  normal strength Skin: No rashes, warm and dry Psych: normal mood and behavior   Assessment/Plan Unresectable stage IIb (T2 a, N1, M0) non-small cell lung cancer, squamous cell carcinoma with right lower lobe lung mass in addition to right hilar lymphadenopathy in March 2023 Concurrent chemo-radiation, last dosing 02/27/2022 Last Surveillance CT Chest showed new areas of nodularity could be treatment related or infectious or inflammatory in etiology, however, neoplasm is not excluded.>> PET scheduled for 10/12/2022 to better evaluate COPD Former smoker  ILD>>UIP Plan Follow up with Dr. Julien Nordmann after PET scan I have placed a referral to Dr. Chase Caller for your ILD. Continue wearing your oxygen . Saturation goal is not to drop below 88%. Continue pulmonary rehab  We will do some labs today. When you get the Esbriet , take 1 pill three times daily for 1  week Then take 2 pills three times daily until you see Dr. Chase Caller Pt. Needs to be added to Dr. Golden Pop schedule for 6-8 weeks from now.  Nausea and weight loss can be an issue. If you experience either, please call to let us know.  Labs today>> all ordered Continue Anoro daily Rinse mouth after use.  Sleep Study was ordered 9/29, please get this scheduled asap Please contact office for sooner follow up if symptoms do not improve or worsen or seek emergency care    I spent 45 minutes dedicated to the care of this patient on the date of this encounter to include pre-visit review of records, face-to-face time with the patient discussing conditions above, post visit ordering of testing, clinical documentation with the electronic health record, making appropriate referrals as documented, and communicating necessary information to the patient's healthcare team.     Magdalen Spatz, NP 09/29/2022  3:10 PM

## 2022-09-29 NOTE — Patient Instructions (Addendum)
It is good to see you today. Follow up with Dr. Julien Nordmann after PET scan I have placed a referral to Dr. Chase Caller for your ILD. Continue wearing your oxygen . Saturation goal is not to drop below 88%. Continue pulmonary rehab  We will do some labs today. When you get the Esbriet , take 1 pill three times daily for 1 week Then take 2 pills three times daily until you see Dr. Chase Caller Pt. Needs to be added to Dr. Golden Pop schedule for 6-8 weeks from now.  Nausea and weight loss can be an issue. If you experience either, please call to let us know.  Labs today>> all ordered Continue Anoro daily Rinse mouth after use.  Sleep Study was ordered 9/29, please get this scheduled asap Please contact office for sooner follow up if symptoms do not improve or worsen or seek emergency care

## 2022-10-02 ENCOUNTER — Encounter: Payer: Self-pay | Admitting: Acute Care

## 2022-10-03 ENCOUNTER — Encounter (HOSPITAL_COMMUNITY): Payer: 59

## 2022-10-04 LAB — ANA: Anti Nuclear Antibody (ANA): POSITIVE — AB

## 2022-10-04 LAB — ANGIOTENSIN CONVERTING ENZYME: Angiotensin-Converting Enzyme: 17 U/L (ref 9–67)

## 2022-10-04 LAB — ANTI-NUCLEAR AB-TITER (ANA TITER)
ANA TITER: 1:320 {titer} — ABNORMAL HIGH
ANA Titer 1: 1:40 {titer} — ABNORMAL HIGH

## 2022-10-04 LAB — ANTI-DNA ANTIBODY, DOUBLE-STRANDED: ds DNA Ab: <1 IU/mL

## 2022-10-04 LAB — CYCLIC CITRUL PEPTIDE ANTIBODY, IGG: Cyclic Citrullin Peptide Ab: <16 UNITS

## 2022-10-04 LAB — ANTI-SCLERODERMA ANTIBODY: Scleroderma (Scl-70) (ENA) Antibody, IgG: <1 AI

## 2022-10-04 LAB — ANCA SCREEN W REFLEX TITER: ANCA SCREEN: NEGATIVE

## 2022-10-04 LAB — RHEUMATOID FACTOR: Rheumatoid fact SerPl-aCnc: <14 IU/mL (ref <14)

## 2022-10-05 ENCOUNTER — Encounter (HOSPITAL_COMMUNITY)
Admission: RE | Admit: 2022-10-05 | Discharge: 2022-10-05 | Disposition: A | Discharge status: Home / Self Care | Payer: 59 | Source: Ambulatory Visit | Attending: Pulmonary Disease | Admitting: Pulmonary Disease

## 2022-10-05 DIAGNOSIS — J479 Bronchiectasis, uncomplicated: Secondary | ICD-10-CM | POA: Diagnosis not present

## 2022-10-05 DIAGNOSIS — J449 Chronic obstructive pulmonary disease, unspecified: Secondary | ICD-10-CM

## 2022-10-05 NOTE — Progress Notes (Signed)
Daily Session Note  Patient Details  Name: Kabao Leite MRN: [id number] Date of Birth: 1956/03/17 Referring Provider:   Flowsheet Row PULMONARY REHAB COPD ORIENTATION from 07/25/2022 in [facility]  Referring Provider Dr. Valeta Harms       Encounter Date: 10/05/2022  Check In:  Session Check In - 10/05/22 1043       Check-In   Supervising physician immediately available to respond to emergencies [facility] MD immediately available    Physician(s) Dr. Dellia Cloud    Location [facility]    Staff Present Leana Roe, BS, Exercise Physiologist;Jailine Lieder BSN, RN    Virtual Visit No    Medication changes reported     No    Fall or balance concerns reported    No    Tobacco Cessation No Change    Warm-up and Cool-down Performed as group-led instruction    Resistance Training Performed Yes    VAD Patient? No    PAD/SET Patient? No      Pain Assessment   Currently in Pain? No/denies    Multiple Pain Sites No             Capillary Blood Glucose: No results found for this or any previous visit (from the past 24 hour(s)).    Social History   Tobacco Use  Smoking Status Former   Packs/day: 0.50   Years: 30.00   Total pack years: 15.00   Types: Cigarettes   Quit date: 10/09/2021   Years since quitting: 0.9  Smokeless Tobacco Never  Tobacco Comments   5-10 cigarettes a day (plans to quit prior to this surgery 05/06/15)    Goals Met:  Proper associated with RPD/PD & O2 Sat Independence with exercise equipment Using PLB without cueing & demonstrates good technique Exercise tolerated well Queuing for purse lip breathing No report of concerns or symptoms today Strength training completed today  Goals Unmet:  Not Applicable  Comments: check out at 11:45   Dr. Kathie Dike is Medical Director for [facility].

## 2022-10-10 ENCOUNTER — Ambulatory Visit (HOSPITAL_COMMUNITY): Payer: 59

## 2022-10-12 ENCOUNTER — Ambulatory Visit: Payer: 59 | Admitting: Internal Medicine

## 2022-10-12 ENCOUNTER — Encounter (HOSPITAL_COMMUNITY)
Admission: RE | Admit: 2022-10-12 | Discharge: 2022-10-12 | Disposition: A | Discharge status: Home / Self Care | Payer: 59 | Source: Ambulatory Visit | Attending: Internal Medicine | Admitting: Internal Medicine

## 2022-10-12 ENCOUNTER — Ambulatory Visit (HOSPITAL_COMMUNITY): Payer: 59

## 2022-10-12 DIAGNOSIS — C3490 Malignant neoplasm of unspecified part of unspecified bronchus or lung: Secondary | ICD-10-CM | POA: Diagnosis present

## 2022-10-12 LAB — GLUCOSE, CAPILLARY: Glucose-Capillary: 96 mg/dL (ref 70–99)

## 2022-10-12 MED ORDER — FLUDEOXYGLUCOSE F - 18 (FDG) INJECTION
8.0000 | Freq: Once | INTRAVENOUS | Status: AC | PRN
  Administered 2022-10-12: 7.47 via INTRAVENOUS

## 2022-10-13 NOTE — Progress Notes (Cosign Needed)
Discharge Progress Report  Patient Details  Name: Diane [name] MRN: [id number] Date of Birth: 1955-10-25 Referring Provider:   Flowsheet Row PULMONARY REHAB COPD ORIENTATION from 07/25/2022 in [place]  Referring Provider Dr. Valeta Harms        Number of Visits: 14  Reason for Discharge:  Early Exit:  Insurance  Smoking History:  Social History   Tobacco Use  Smoking Status Former   Packs/day: 0.50   Years: 30.00   Total pack years: 15.00   Types: Cigarettes   Quit date: 10/09/2021   Years since quitting: 1.0  Smokeless Tobacco Never  Tobacco Comments   5-10 cigarettes a day (plans to quit prior to this surgery 05/06/15)    Diagnosis:  Chronic obstructive pulmonary disease, unspecified COPD type ([place])  Bronchiectasis, uncomplicated ([place])  ADL UCSD:  Pulmonary Assessment Scores     Row Name 07/25/22 0824         ADL UCSD   SOB Score total 11     Rest 0     Walk 0     Stairs 1     Bath 0     Dress 0     Shop 0       CAT Score   CAT Score 21       mMRC Score   mMRC Score 1              Initial Exercise Prescription:  Initial Exercise Prescription - 07/25/22 0800       Date of Initial Exercise RX and Referring Provider   Date 07/25/22    Referring Provider Dr. Valeta Harms    Expected Discharge Date 11/09/22      Treadmill   MPH 1.8    Grade 0    Minutes 17      NuStep   Level 1    SPM 60    Minutes 22      Prescription Details   Frequency (times per week) 2    Duration Progress to 30 minutes of continuous aerobic without signs/symptoms of physical distress      Intensity   THRR 40-80% of Max Heartrate 62-124    Ratings of Perceived Exertion 11-13    Perceived Dyspnea 0-4      Resistance Training   Training Prescription Yes    Weight 4    Reps 10-15             Discharge Exercise Prescription (Final Exercise Prescription Changes):  Exercise Prescription Changes - 09/21/22 1200       Response to  Exercise   Blood Pressure (Admit) 116/62    Blood Pressure (Exercise) 136/60    Blood Pressure (Exit) 108/68    Heart Rate (Admit) 92 bpm    Heart Rate (Exercise) 115 bpm    Heart Rate (Exit) 99 bpm    Oxygen Saturation (Admit) 94 %    Oxygen Saturation (Exercise) 88 %    Oxygen Saturation (Exit) 96 %    Rating of Perceived Exertion (Exercise) 11    Perceived Dyspnea (Exercise) 11    Duration Continue with 30 min of aerobic exercise without signs/symptoms of physical distress.    Intensity THRR unchanged      Progression   Progression Continue to progress workloads to maintain intensity without signs/symptoms of physical distress.      Resistance Training   Training Prescription Yes    Weight 3    Reps 10-15    Time 10 Minutes  Oxygen   Oxygen Continuous    Liters 3      Treadmill   MPH 2.8    Grade 2.5    Minutes 17    METs 4.11      NuStep   Level 4    SPM 86    Minutes 22    METs 2.1             Functional Capacity:  6 Minute Walk     Row Name 07/25/22 0847         6 Minute Walk   Phase Initial     Distance 1600 feet     Walk Time 6 minutes     # of Rest Breaks 1     MPH 3.03     METS 4.01     RPE 12     Perceived Dyspnea  13     VO2 Peak 14.05     Symptoms Yes (comment)     Comments one standing 5 sec break to increase o2     Resting HR 82 bpm     Resting BP 108/62     Resting Oxygen Saturation  96 %     Exercise Oxygen Saturation  during 6 min walk 86 %     Max Ex. HR 120 bpm     Max Ex. BP 130/60     2 Minute Post BP 116/60       Interval HR   1 Minute HR 112     2 Minute HR 110     3 Minute HR 119     4 Minute HR 120     5 Minute HR 109     6 Minute HR 110     2 Minute Post HR 86     Interval Heart Rate? Yes       Interval Oxygen   Interval Oxygen? Yes     Baseline Oxygen Saturation % 96 %     1 Minute Oxygen Saturation % 87 %     1 Minute Liters of Oxygen 0 L     2 Minute Oxygen Saturation % 87 %     2 Minute Liters  of Oxygen 0 L     3 Minute Oxygen Saturation % 86 %     3 Minute Liters of Oxygen 0 L     4 Minute Oxygen Saturation % 86 %     4 Minute Liters of Oxygen 0 L     5 Minute Oxygen Saturation % 87 %     5 Minute Liters of Oxygen 0 L     6 Minute Oxygen Saturation % 86 %     6 Minute Liters of Oxygen 0 L     2 Minute Post Oxygen Saturation % 95 %     2 Minute Post Liters of Oxygen 0 L              Psychological, QOL, Others - Outcomes: PHQ 2/9:    07/25/2022    8:51 AM 06/15/2020    8:14 AM 06/11/2020    9:08 AM 03/31/2020    9:09 AM 02/18/2020   10:14 AM  Depression screen PHQ 2/9  Decreased Interest 0 0 0 1 1  Down, Depressed, Hopeless 0 0 0 1 0  PHQ - 2 Score 0 0 0 2 1  Altered sleeping 1   0 1  Tired, decreased energy 0   1 0  Change in appetite 0  1 1  Feeling bad or failure about yourself  0   1 0  Trouble concentrating 1   1 0  Moving slowly or fidgety/restless 0   1 0  Suicidal thoughts 0   0 0  PHQ-9 Score 2   7 3   Difficult doing work/chores Not difficult at all   Somewhat difficult     Quality of Life:  Quality of Life - 07/25/22 0855       Quality of Life   Select Quality of Life      Quality of Life Scores   Health/Function Pre 28 %    Socioeconomic Pre 30 %    Psych/Spiritual Pre 30 %    Family Pre 28.8 %    GLOBAL Pre 28.97 %             Personal Goals: Goals established at orientation with interventions provided to work toward goal.  Personal Goals and Risk Factors at Admission - 07/25/22 0908       Core Components/Risk Factors/Patient Goals on Admission   Improve shortness of breath with ADL's Yes    Intervention Provide education, individualized exercise plan and daily activity instruction to help decrease symptoms of SOB with activities of daily living.    Expected Outcomes Short Term: Improve cardiorespiratory fitness to achieve a reduction of symptoms when performing ADLs;Long Term: Be able to perform more ADLs without symptoms or  delay the onset of symptoms              Personal Goals Discharge:  Goals and Risk Factor Review     Row Name 09/12/22 0849             Core Components/Risk Factors/Patient Goals Review   Personal Goals Review Increase knowledge of respiratory medications and ability to use respiratory devices properly.;Develop more efficient breathing techniques such as purse lipped breathing and diaphragmatic breathing and practicing self-pacing with activity.;Improve shortness of breath with ADL's       Review [name] uses oxygen while on treadmill.  Oxygen at 2-3 liters on facility oxygen.  However, pt used her O2 from home once and needed 5 liters, Sats averaging about 94-95%.  Pt is on Room air when using the NuStep, Sats are 90-98%.  Blood pressure has been stable.  Patient is consistent with attendance and enjoys socializing with class members and staff.  Has no current issues with psychosocial issues.       Expected Outcomes Improve cardiorespiratory fitness to achieve a reduction of SOB symptoms.  Attend PR twice/week and continue exercises at home.                Exercise Goals and Review:  Exercise Goals     Row Name 07/25/22 0853 08/22/22 1249 09/19/22 1239         Exercise Goals   Increase Physical Activity Yes Yes Yes     Intervention Provide advice, education, support and counseling about physical activity/exercise needs.;Develop an individualized exercise prescription for aerobic and resistive training based on initial evaluation findings, risk stratification, comorbidities and participant's personal goals. Provide advice, education, support and counseling about physical activity/exercise needs.;Develop an individualized exercise prescription for aerobic and resistive training based on initial evaluation findings, risk stratification, comorbidities and participant's personal goals. Provide advice, education, support and counseling about physical activity/exercise needs.;Develop  an individualized exercise prescription for aerobic and resistive training based on initial evaluation findings, risk stratification, comorbidities and participant's personal goals.     Expected Outcomes Short Term:  Attend rehab on a regular basis to increase amount of physical activity.;Long Term: Exercising regularly at least 3-5 days a week.;Long Term: Add in home exercise to make exercise part of routine and to increase amount of physical activity. Short Term: Attend rehab on a regular basis to increase amount of physical activity.;Long Term: Exercising regularly at least 3-5 days a week.;Long Term: Add in home exercise to make exercise part of routine and to increase amount of physical activity. Short Term: Attend rehab on a regular basis to increase amount of physical activity.;Long Term: Exercising regularly at least 3-5 days a week.;Long Term: Add in home exercise to make exercise part of routine and to increase amount of physical activity.     Increase Strength and Stamina Yes Yes Yes     Intervention Provide advice, education, support and counseling about physical activity/exercise needs.;Develop an individualized exercise prescription for aerobic and resistive training based on initial evaluation findings, risk stratification, comorbidities and participant's personal goals. Provide advice, education, support and counseling about physical activity/exercise needs.;Develop an individualized exercise prescription for aerobic and resistive training based on initial evaluation findings, risk stratification, comorbidities and participant's personal goals. Provide advice, education, support and counseling about physical activity/exercise needs.;Develop an individualized exercise prescription for aerobic and resistive training based on initial evaluation findings, risk stratification, comorbidities and participant's personal goals.     Expected Outcomes Short Term: Increase workloads from initial exercise  prescription for resistance, speed, and METs.;Short Term: Perform resistance training exercises routinely during rehab and add in resistance training at home;Long Term: Improve cardiorespiratory fitness, muscular endurance and strength as measured by increased METs and functional capacity (6MWT) Short Term: Increase workloads from initial exercise prescription for resistance, speed, and METs.;Short Term: Perform resistance training exercises routinely during rehab and add in resistance training at home;Long Term: Improve cardiorespiratory fitness, muscular endurance and strength as measured by increased METs and functional capacity (6MWT) Short Term: Increase workloads from initial exercise prescription for resistance, speed, and METs.;Short Term: Perform resistance training exercises routinely during rehab and add in resistance training at home;Long Term: Improve cardiorespiratory fitness, muscular endurance and strength as measured by increased METs and functional capacity (6MWT)     Able to understand and use rate of perceived exertion (RPE) scale Yes Yes Yes     Intervention Provide education and explanation on how to use RPE scale Provide education and explanation on how to use RPE scale Provide education and explanation on how to use RPE scale     Expected Outcomes Short Term: Able to use RPE daily in rehab to express subjective intensity level;Long Term:  Able to use RPE to guide intensity level when exercising independently Short Term: Able to use RPE daily in rehab to express subjective intensity level;Long Term:  Able to use RPE to guide intensity level when exercising independently Short Term: Able to use RPE daily in rehab to express subjective intensity level;Long Term:  Able to use RPE to guide intensity level when exercising independently     Able to understand and use Dyspnea scale Yes Yes Yes     Intervention Provide education and explanation on how to use Dyspnea scale Provide education and  explanation on how to use Dyspnea scale Provide education and explanation on how to use Dyspnea scale     Expected Outcomes Short Term: Able to use Dyspnea scale daily in rehab to express subjective sense of shortness of breath during exertion;Long Term: Able to use Dyspnea scale to guide intensity level when exercising  independently Short Term: Able to use Dyspnea scale daily in rehab to express subjective sense of shortness of breath during exertion;Long Term: Able to use Dyspnea scale to guide intensity level when exercising independently Short Term: Able to use Dyspnea scale daily in rehab to express subjective sense of shortness of breath during exertion;Long Term: Able to use Dyspnea scale to guide intensity level when exercising independently     Knowledge and understanding of Target Heart Rate Range (THRR) Yes Yes Yes     Intervention Provide education and explanation of THRR including how the numbers were predicted and where they are located for reference Provide education and explanation of THRR including how the numbers were predicted and where they are located for reference Provide education and explanation of THRR including how the numbers were predicted and where they are located for reference     Expected Outcomes Short Term: Able to state/look up THRR;Short Term: Able to use daily as guideline for intensity in rehab;Long Term: Able to use THRR to govern intensity when exercising independently Short Term: Able to state/look up THRR;Short Term: Able to use daily as guideline for intensity in rehab;Long Term: Able to use THRR to govern intensity when exercising independently Short Term: Able to state/look up THRR;Short Term: Able to use daily as guideline for intensity in rehab;Long Term: Able to use THRR to govern intensity when exercising independently     Understanding of Exercise Prescription Yes Yes Yes     Intervention Provide education, explanation, and written materials on patient's  individual exercise prescription Provide education, explanation, and written materials on patient's individual exercise prescription Provide education, explanation, and written materials on patient's individual exercise prescription     Expected Outcomes Short Term: Able to explain program exercise prescription;Long Term: Able to explain home exercise prescription to exercise independently Short Term: Able to explain program exercise prescription;Long Term: Able to explain home exercise prescription to exercise independently Short Term: Able to explain program exercise prescription;Long Term: Able to explain home exercise prescription to exercise independently              Exercise Goals Re-Evaluation:  Exercise Goals Re-Evaluation     Row Name 08/22/22 1249 09/19/22 1240           Exercise Goal Re-Evaluation   Exercise Goals Review Increase Physical Activity;Increase Strength and Stamina;Able to understand and use rate of perceived exertion (RPE) scale;Able to understand and use Dyspnea scale;Knowledge and understanding of Target Heart Rate Range (THRR);Understanding of Exercise Prescription Increase Physical Activity;Increase Strength and Stamina;Able to understand and use rate of perceived exertion (RPE) scale;Able to understand and use Dyspnea scale;Knowledge and understanding of Target Heart Rate Range (THRR);Understanding of Exercise Prescription      Comments Pt has completed 6 sessions of PR. She is very motivated and eager to push herself. She has started using oxygen at home and in class for the warmup and treadmill. She is currently exercising at 3.72 METs on the treadmill. Will continue to monitor and progress as able. Pt has completed 11 sessions of PR. She continues to be matoviated during class and pushes herself. She has had to increase her oxygen on the treadmill to 4L and uses oxygen during the warmup as well. Pt is currently exercising at 4.11 METs on the treadmill. Will  continue to monitor and progress as able.      Expected Outcomes Through exercise at home and at rehab, the patient will meet their stated goals. Through exercise at home and at  rehab, the patient will meet their stated goals.               Nutrition & Weight - Outcomes:  Pre Biometrics - 07/25/22 0854       Pre Biometrics   Height 5\' 5"  (1.651 m)    Weight 146 lb 6.2 oz (66.4 kg)    Waist Circumference 34 inches    Hip Circumference 38 inches    Waist to Hip Ratio 0.89 %    BMI (Calculated) 24.36    Triceps Skinfold 20 mm    % Body Fat 34.8 %    Grip Strength 25.6 kg    Flexibility 0 in    Single Leg Stand 8.03 seconds              Nutrition:  Nutrition Therapy & Goals - 09/28/22 1541       Personal Nutrition Goals   Comments Patient scored 56 on diet assessment. We offer 2 educational sessions on heart healthy nutrition with handouts and assistance with RD referral if patient is interested.      Intervention Plan   Intervention Nutrition handout(s) given to patient.    Expected Outcomes Short Term Goal: Understand basic principles of dietary content, such as calories, fat, sodium, cholesterol and nutrients.             Nutrition Discharge:  Nutrition Assessments - 07/25/22 0853       MEDFICTS Scores   Pre Score 56             Education Questionnaire Score:  Knowledge Questionnaire Score - 07/25/22 0826       Knowledge Questionnaire Score   Pre Score 5/18             Pt discharged from PR after 14 sessions. She is retiring from her job, and her insurance will be changing with the new year, so she wishes to be discharged.

## 2022-10-18 ENCOUNTER — Telehealth: Payer: Self-pay | Admitting: Internal Medicine

## 2022-10-18 NOTE — Telephone Encounter (Signed)
Called patient regarding upcoming January appointment. Left a voicemail.

## 2022-10-30 ENCOUNTER — Inpatient Hospital Stay: Payer: Medicare Other | Attending: Internal Medicine | Admitting: Internal Medicine

## 2022-10-30 VITALS — BP 125/81 | HR 86 | Temp 97.8°F | Resp 18 | Wt 149.2 lb

## 2022-10-30 DIAGNOSIS — Z9221 Personal history of antineoplastic chemotherapy: Secondary | ICD-10-CM | POA: Insufficient documentation

## 2022-10-30 DIAGNOSIS — Z90710 Acquired absence of both cervix and uterus: Secondary | ICD-10-CM | POA: Diagnosis not present

## 2022-10-30 DIAGNOSIS — E7800 Pure hypercholesterolemia, unspecified: Secondary | ICD-10-CM | POA: Insufficient documentation

## 2022-10-30 DIAGNOSIS — C3490 Malignant neoplasm of unspecified part of unspecified bronchus or lung: Secondary | ICD-10-CM | POA: Diagnosis not present

## 2022-10-30 DIAGNOSIS — Z923 Personal history of irradiation: Secondary | ICD-10-CM | POA: Diagnosis not present

## 2022-10-30 DIAGNOSIS — Z79899 Other long term (current) drug therapy: Secondary | ICD-10-CM | POA: Insufficient documentation

## 2022-10-30 DIAGNOSIS — Z85118 Personal history of other malignant neoplasm of bronchus and lung: Secondary | ICD-10-CM | POA: Diagnosis not present

## 2022-10-30 DIAGNOSIS — I10 Essential (primary) hypertension: Secondary | ICD-10-CM | POA: Insufficient documentation

## 2022-10-30 DIAGNOSIS — J449 Chronic obstructive pulmonary disease, unspecified: Secondary | ICD-10-CM | POA: Diagnosis not present

## 2022-10-30 NOTE — Progress Notes (Signed)
[facility] [phone]   [phone]  OFFICE PROGRESS NOTE  Ignatius Specking, MD [address] [place] [address]  DIAGNOSIS: Unresectable stage IIB (T2a, N1, M0) non-small cell lung cancer, squamous cell carcinoma presented with right lower lobe lung mass in addition to right hilar lymphadenopathy diagnosed in March 2023.    The patient had PD-L1 expression of 99%.  PRIOR THERAPY: A course of concurrent chemoradiation with weekly carboplatin for AUC of 2 and paclitaxel 45 Mg/M2.  First dose started 01/16/2022.  Status post 7 cycles.  Last dose was given on 02/27/2022.  CURRENT THERAPY: Observation.  INTERVAL HISTORY: Diane [name] 67 y.o. female returns to the clinic today for follow-up visit accompanied by her husband.  The patient is feeling fine today with no concerning complaints.  She came back from 11-day cruise in the [place] area and she enjoyed her time.  She denied having any current chest pain, shortness of breath but has mild cough with no hemoptysis.  She has no nausea, vomiting, diarrhea or constipation.  She has no headache or visual changes.  She denied having any significant weight loss or night sweats.  She had a PET scan performed recently and she is here for evaluation and discussion of her PET scan results and recommendation regarding her condition.  MEDICAL HISTORY: Past Medical History:  Diagnosis Date   Anxiety    Arthritis    fingers, left foot   C. difficile diarrhea 2015   COPD (chronic obstructive pulmonary disease) (HCC)    Depression    Eczema    H pylori ulcer    Headache(784.0)    Hypercholesterolemia    Hypertension    Kidney stones    20 years ago   Pneumonia    5 years ago   Seizures (HCC)    2 years ago, "cluster of seizures" none since   Shingles     ALLERGIES:  is allergic to alpha-gal, other, penicillins, and prozac [fluoxetine].  MEDICATIONS:  Current Outpatient Medications  Medication Sig  Dispense Refill   ALPRAZolam (XANAX) 0.5 MG tablet Take 0.5 mg by mouth daily as needed. (Patient not taking: Reported on 08/08/2022)     cetirizine (ZYRTEC) 10 MG tablet Take 10 mg by mouth at bedtime.     doxycycline (VIBRA-TABS) 100 MG tablet Take 1 tablet (100 mg total) by mouth 2 (two) times daily. 14 tablet 0   EPINEPHrine (AUVI-Q) 0.3 mg/0.3 mL IJ SOAJ injection Inject 0.3 mLs (0.3 mg total) into the muscle as needed for anaphylaxis. 2 each 1   fluticasone (FLONASE) 50 MCG/ACT nasal spray Place 2 sprays into both nostrils daily. (Patient taking differently: Place 2 sprays into both nostrils daily as needed for allergies.) 16 g 5   hydrochlorothiazide (MICROZIDE) 12.5 MG capsule Take 12.5 mg by mouth daily.     hydrOXYzine (ATARAX) 25 MG tablet Take 25 mg by mouth daily.     omeprazole (PRILOSEC) 20 MG capsule Take 1 capsule (20 mg total) by mouth 2 (two) times daily. (Needs to be seen before next refill) 60 capsule 0   OVER THE COUNTER MEDICATION Take 1 capsule by mouth daily. Probiotic     predniSONE (DELTASONE) 10 MG tablet Prednisone taper; 10 mg tablets: 4 tabs x 2 days, 3 tabs x 2 days, 2 tabs x 2 days 1 tab x 2 days then stop. 20 tablet 0   rosuvastatin (CRESTOR) 10 MG tablet Take 10 mg by mouth at  bedtime.     umeclidinium-vilanterol (ANORO ELLIPTA) 62.5-25 MCG/ACT AEPB Inhale 1 puff into the lungs daily. 60 each 3   No current facility-administered medications for this visit.    SURGICAL HISTORY:  Past Surgical History:  Procedure Laterality Date   ABDOMINAL HYSTERECTOMY     ADENOIDECTOMY     APPENDECTOMY     BRONCHIAL BIOPSY  12/20/2021   Procedure: BRONCHIAL BIOPSIES;  Surgeon: Josephine Igo, DO;  Location: [facility];  Service: Pulmonary;;   BRONCHIAL BRUSHINGS  12/20/2021   Procedure: BRONCHIAL BRUSHINGS;  Surgeon: Josephine Igo, DO;  Location: [facility];  Service: Pulmonary;;   BRONCHIAL NEEDLE ASPIRATION BIOPSY  12/20/2021   Procedure: BRONCHIAL NEEDLE ASPIRATION  BIOPSIES;  Surgeon: Josephine Igo, DO;  Location: [facility];  Service: Pulmonary;;   CESAREAN SECTION     CHOLECYSTECTOMY     COLONOSCOPY  06/10/2012   RMR: Colonic polyps -removed as described above.    FINE NEEDLE ASPIRATION  12/20/2021   Procedure: FINE NEEDLE ASPIRATION (FNA) LINEAR;  Surgeon: Josephine Igo, DO;  Location: [facility];  Service: Pulmonary;;   KNEE ARTHROSCOPY  1973   right knee,  torn cart   LUMBAR DISC SURGERY  05/12/2015   L4 L5   LUMBAR LAMINECTOMY/DECOMPRESSION MICRODISCECTOMY Left 05/12/2015   Procedure: Left L4-5 Microdiscectomy;  Surgeon: Eldred Manges, MD;  Location: [facility];  Service: Orthopedics;  Laterality: Left;   TONSILLECTOMY     VIDEO BRONCHOSCOPY WITH ENDOBRONCHIAL ULTRASOUND Bilateral 12/20/2021   Procedure: VIDEO BRONCHOSCOPY WITH ENDOBRONCHIAL ULTRASOUND;  Surgeon: Josephine Igo, DO;  Location: [facility];  Service: Pulmonary;  Laterality: Bilateral;   VIDEO BRONCHOSCOPY WITH RADIAL ENDOBRONCHIAL ULTRASOUND  12/20/2021   Procedure: RADIAL ENDOBRONCHIAL ULTRASOUND;  Surgeon: Josephine Igo, DO;  Location: [facility];  Service: Pulmonary;;    REVIEW OF SYSTEMS:  Constitutional: negative Eyes: negative Ears, nose, mouth, throat, and face: negative Respiratory: positive for cough Cardiovascular: negative Gastrointestinal: negative Genitourinary:negative Integument/breast: negative Hematologic/lymphatic: negative Musculoskeletal:negative Neurological: negative Behavioral/Psych: negative Endocrine: negative Allergic/Immunologic: negative   PHYSICAL EXAMINATION: General appearance: alert, cooperative, and no distress Head: Normocephalic, without obvious abnormality, atraumatic Neck: no adenopathy, no JVD, supple, symmetrical, trachea midline, and thyroid not enlarged, symmetric, no tenderness/mass/nodules Lymph nodes: Cervical, supraclavicular, and axillary nodes normal. Resp: clear to auscultation bilaterally Back: symmetric, no  curvature. ROM normal. No CVA tenderness. Cardio: regular rate and rhythm, S1, S2 normal, no murmur, click, rub or gallop GI: soft, non-tender; bowel sounds normal; no masses,  no organomegaly Extremities: extremities normal, atraumatic, no cyanosis or edema Neurologic: Alert and oriented X 3, normal strength and tone. Normal symmetric reflexes. Normal coordination and gait  ECOG PERFORMANCE STATUS: 1 - Symptomatic but completely ambulatory  Blood pressure 125/81, pulse 86, temperature 97.8 F (36.6 C), temperature source Oral, resp. rate 18, weight 149 lb 4 oz (67.7 kg), SpO2 94 %.  LABORATORY DATA: Lab Results  Component Value Date   WBC 6.4 09/26/2022   HGB 12.6 09/26/2022   HCT 36.8 09/26/2022   MCV 94.1 09/26/2022   PLT 303 09/26/2022      Chemistry      Component Value Date/Time   NA 137 09/26/2022 0953   NA 139 12/03/2019 0928   K 4.1 09/26/2022 0953   CL 102 09/26/2022 0953   CO2 28 09/26/2022 0953   BUN 12 09/26/2022 0953   BUN 14 12/03/2019 0928   CREATININE 0.65 09/26/2022 0953   CREATININE 0.64 10/14/2021 6701  Component Value Date/Time   CALCIUM 9.8 09/26/2022 0953   ALKPHOS 72 09/26/2022 0953   AST 20 09/26/2022 0953   ALT 13 09/26/2022 0953   BILITOT 0.4 09/26/2022 0953       RADIOGRAPHIC STUDIES: NM PET Image Restage (PS) Skull Base to Thigh (F-18 FDG)  Result Date: 10/17/2022 CLINICAL DATA:  Subsequent treatment strategy for non-small cell lung cancer. Chemotherapy and radiation therapy completed. EXAM: NUCLEAR MEDICINE PET SKULL BASE TO THIGH TECHNIQUE: 7.47 mCi F-18 FDG was injected intravenously. Full-ring PET imaging was performed from the skull base to thigh after the radiotracer. CT data was obtained and used for attenuation correction and anatomic localization. Fasting blood glucose: 96 mg/dl COMPARISON:  Chest CT 57/36/7795 and 06/27/2022.  PET-CT 12/13/2021. FINDINGS: Mediastinal blood pool activity: SUV max 1.8 NECK: No hypermetabolic  cervical lymph nodes are identified. No suspicious activity identified within the pharyngeal mucosal space. Incidental CT findings: Bilateral carotid atherosclerosis. CHEST: There are no hypermetabolic mediastinal, hilar or axillary lymph nodes. There is no residual focal hypermetabolic activity within the treated lesion within the right lower lobe. Post treatment changes are present in this area with an SUV max of 4.2. There is focal hypermetabolic activity within the enlarging nodular areas noted on recent CT. Right upper lobe lesion measuring approximately 1.6 x 1.0 cm on image 23/7 has an SUV max of 5.6. There is a less well-defined lesion in the right lower lobe measuring approximately 1.2 cm on image 47/7 with an SUV max of 4.6. No suspicious metabolic activity within the left lung. Incidental CT findings: Atherosclerosis of the aorta, great vessels and coronary arteries. Treatment changes in the right lung and underlying findings of pulmonary fibrosis as demonstrated on previous CT. ABDOMEN/PELVIS: There is no hypermetabolic activity within the liver, adrenal glands, spleen or pancreas. There is no hypermetabolic nodal activity in the abdomen or pelvis. Previously noted mildly prominent metabolic activity within small inguinal lymph nodes bilaterally has resolved. Incidental CT findings: Previous cholecystectomy. Aortic and branch vessel atherosclerosis and distal colonic diverticulosis are noted. SKELETON: There is no hypermetabolic activity to suggest osseous metastatic disease. Incidental CT findings: none IMPRESSION: 1. The treated lesion in the right lower lobe demonstrates no residual focal hypermetabolic activity. 2. As seen on recent CT, there are enlarging nodular densities in the right lung which demonstrate moderate hypermetabolic activity. These are nonspecific and may be treatment related or infectious, although neoplasm is not excluded. Recommend chest CT follow-up in 3 months. 3. No evidence of  distant metastatic disease. 4.  Aortic Atherosclerosis (ICD10-I70.0). Electronically Signed   By: Carey Bullocks M.D.   On: 10/17/2022 11:05    ASSESSMENT AND PLAN: This is a very pleasant 67  years old white female diagnosed with unresectable stage IIb (T2 a, N1, M0) non-small cell lung cancer, squamous cell carcinoma with right lower lobe lung mass in addition to right hilar lymphadenopathy in March 2023. The patient has PD-L1 expression of 99%. She completed a course of concurrent chemoradiation with weekly carboplatin for AUC of 2 and paclitaxel 45 Mg/M2 status post 7 cycles.  Last dose was given on 02/27/2022. The patient is currently on observation and she is feeling fine except for the baseline shortness of breath and fatigue. She had repeat CT scan of the chest performed recently.  I personally and independently reviewed the scan images and discussed the result with the patient and her husband. Her scan showed finding compatible with a combination of emphysema and interstitial lung disease which has  been progressive.  She also had evolving postradiation changes in the right lung and the treated nodule in the superior segment of the right lower lobe appeared grossly similar but there was new nodules within the right lung most concerning of which is in the right upper lobe and currently measures 1.9 x 1.3 cm requiring close monitoring. The patient had a PET scan performed recently.  I personally and independently reviewed the scan images and discussed the result and showed the images to the patient and her husband. Her scan showed no concerning findings for disease progression in the area of concern showed moderate metabolic activity that are nonspecific and may be related to infectious or inflammatory process but neoplasm could not be completely excluded. I recommended for the patient to continue on observation with repeat CT scan of the chest in 3 months for restaging of her disease. The patient  was advised to call immediately if she has any other concerning symptoms in the interval.  The patient voices understanding of current disease status and treatment options and is in agreement with the current care plan.  All questions were answered. The patient knows to call the clinic with any problems, questions or concerns. We can certainly see the patient much sooner if necessary.  The total time spent in the appointment was 30 minutes.  Disclaimer: This note was dictated with voice recognition software. Similar sounding words can inadvertently be transcribed and may not be corrected upon review.

## 2022-10-31 ENCOUNTER — Telehealth: Payer: Self-pay | Admitting: Pharmacist

## 2022-10-31 DIAGNOSIS — J849 Interstitial pulmonary disease, unspecified: Secondary | ICD-10-CM

## 2022-10-31 NOTE — Telephone Encounter (Signed)
Received new start paperwork for ESBRIET but only prescriber form was received. Submitted a Prior Authorization request to [facility] for ESBRIET (brand name due to [facility] PAP requirements) via CoverMyMeds. Will update once we receive a response.  Key: Howard Pouch, PharmD, MPH, BCPS, CPP Clinical Pharmacist (Rheumatology and Pulmonology)

## 2022-11-02 ENCOUNTER — Other Ambulatory Visit (HOSPITAL_COMMUNITY): Payer: Self-pay

## 2022-11-02 NOTE — Telephone Encounter (Signed)
Received notification from [facility] regarding a prior authorization for ESBRIET. Authorization has been APPROVED from 10/31/2022 to 10/16/2023. Approval letter sent to scan center.  Unable to run test claim as patient must fill through [facility]: [phone]  We are missing pt portion of PAP application. MyChart message sent to pt today  Chesley Mires, PharmD, MPH, BCPS, CPP Clinical Pharmacist (Rheumatology and Pulmonology)

## 2022-11-03 NOTE — Telephone Encounter (Signed)
Submitted Patient Assistance Application to [place] for [employer] along with provider portion, PA, med list, and insurance card copy. Patient sent her portion in on her own. Will update patient when we receive a response.  Fax# [phone] Phone# [phone]  Chesley Mires, PharmD, MPH, BCPS, CPP Clinical Pharmacist (Rheumatology and Pulmonology)

## 2022-11-03 NOTE — Telephone Encounter (Signed)
Received fax from [facility] stating provider form is outdated.  Completed updated and form and faxed to [facility]  Fax# [phone] Phone# [phone]  Chesley Mires, PharmD, MPH, BCPS, CPP Clinical Pharmacist (Rheumatology and Pulmonology)

## 2022-11-10 NOTE — Telephone Encounter (Signed)
Received call from [facility] stating updated prescriber form was cut off from halfway down to sheet. Pharmacy team will refax form to [facility], PharmD, MPH, BCPS, CPP Clinical Pharmacist (Rheumatology and Pulmonology)

## 2022-11-13 ENCOUNTER — Ambulatory Visit (INDEPENDENT_AMBULATORY_CARE_PROVIDER_SITE_OTHER): Payer: Medicare Other | Admitting: Internal Medicine

## 2022-11-13 ENCOUNTER — Encounter: Payer: Self-pay | Admitting: Internal Medicine

## 2022-11-13 VITALS — BP 120/64 | Ht 65.0 in | Wt 146.6 lb

## 2022-11-13 DIAGNOSIS — R0600 Dyspnea, unspecified: Secondary | ICD-10-CM | POA: Diagnosis not present

## 2022-11-13 DIAGNOSIS — J439 Emphysema, unspecified: Secondary | ICD-10-CM

## 2022-11-13 DIAGNOSIS — J84112 Idiopathic pulmonary fibrosis: Secondary | ICD-10-CM | POA: Diagnosis not present

## 2022-11-13 DIAGNOSIS — Z85118 Personal history of other malignant neoplasm of bronchus and lung: Secondary | ICD-10-CM

## 2022-11-13 LAB — BRAIN NATRIURETIC PEPTIDE: Pro B Natriuretic peptide (BNP): 55 pg/mL (ref 0.0–100.0)

## 2022-11-13 MED ORDER — PREDNISONE 10 MG PO TABS: ORAL_TABLET | ORAL | 0 refills | Status: AC

## 2022-11-13 NOTE — Patient Instructions (Addendum)
ICD-10-CM   1. IPF (idiopathic pulmonary fibrosis) (HCC)  J84.112 Alpha-1 antitrypsin phenotype    QuantiFERON-TB Gold Plus    B Nat Peptide    AMB referral to pulmonary rehabilitation    2. Pulmonary emphysema, unspecified emphysema type ([place])  J43.9 AMB referral to pulmonary rehabilitation    3. UIP (usual interstitial pneumonitis) ([place])  J84.112     4. History of lung cancer  Z85.118     5. Dyspnea, unspecified type  R06.00 B Nat Peptide     The diagnosis is IPF along with emphysema. REsolving acute bronchtiis - probably need another prednisone  Plan - Please take prednisone 40 mg x1 day, then 30 mg x1 day, then 20 mg x1 day, then 10 mg x1 day, and then 5 mg x1 day and stop - check blood alpha 1 AT check, quantiferon gold and BNP - get echo - get full PFT next 6 weeks - start esbriet per protocol - continue anoro per protocol - re-refer pulmonary rehab - continue home exercise but keep pulse ox > = 86% - connect with Marlane Mingle patient support group chair - [email or web address]  Followup  6 weeks with Eric Form or Dr Chase Caller to assess progress

## 2022-11-13 NOTE — Progress Notes (Signed)
09/29/2022 Pt.presents for follow up. She was last seen 08/08/2022 for worsening dyspnea after Covid 05/2022. She was initially treated for a COPD flare 07/14/2022 with Doxycycline and pred taper. Her discolored secretions cleared, but she continued to have oxygen desaturations at Pulmonary rehab, as low as 85%. Her CT scan done 06/28/2022 showed subpleural reticulation and traction bronchiectasis.She had crackles on exam. I communicated with Dr. Julien [name] medical oncology, and he agreed to allow me to change his follow up CT Chest in 09/2022 to an HRCT so we could better evaluate for ILD. Scan was done 09/27/2022 and it confirmed ILD , UIP, progressive compared to prior exams, but stable compared to most recent study. We got her home  oxygen ordered 08/08/2022. She understands goal is for oxygen to be > 88% at all times.  She has POC and home concentrator. She is monitoring her oxygen saturations . I have spoken with Dr. Chase [name], and we will start patient on Esbriet ASAP. He will get her onto his schedule within the next 6-8 weeks. We have provided the patient with paperwork for drug ( usually takes 2 weeks toi get approved and mailed) and the patient questionnaire for ILD. I will also draw ANA, ANCA screen reflex titer, angiotensin-converting enzyme, anti-DNA antibody double-stranded, antiscleroderma antibody, CCP, rheumatoid factor, ESR.  This will give Dr. Chase [name] information to work with when he sees her in 66 to 20 weeks.  Patient and her husband both verbalized understanding of the above and are anxious to go ahead and get started on antifibrotic therapy. Patient's scan was also concerning for notation of a macrolobulated nodule with spiculated margins in the right upper lobe measuring 1.9 x 1.3 cm and a slightly ill-defined 1.9 x 1.1 cm nodular area in the right lower lobe at the base.These new areas of nodularity could be treatment related or infectious or inflammatory in etiology, however,  neoplasm is not excluded.  Dr. Julien [name] reviewed this with her yesterday and he has ordered a PET scan to further evaluate. This is scheduled for 10/12/2022.  Home sleep study ordered 07/14/2022 has still not been done.  I have checked with PCC's and they have called the patient to get her scheduled.  She will need follow-up after this sleep study has been completed.  OV 11/13/2022 transfer of care to the ILD center with Dr. Chase [name].  Subjective:  Patient ID: Diane [name], female , DOB: 1956-03-13 , age 67 y.o. , MRN: [id number] , ADDRESS: [place] [id number] PCP Diane Chroman, MD Patient Care Team: Diane Chroman, MD as PCP - General (Internal Medicine) Diane [name] Diane Estimable, MD as Consulting Physician (Gastroenterology) Diane Monarch, MD (Inactive) as Consulting Physician (Dermatology)  This Provider for this visit: Treatment Team:  Attending Provider: Brand Males, MD    11/13/2022 -   Chief Complaint  Patient presents with   New Patient (Initial Visit)    New pt from [facility] for ILD. Patient filled out packet and for Esbriet but never started the medication. CT is for April 2024. Pt is just getting over PNA. Still has a slight cough. Just finished prednisone and Levaquin.      HPI Diane [name] 67 y.o. -presents with her husband Diane [name].  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  She retire and then in the spring 2023 was diagnosed with right lower lobe non-small cell cancer stage II.  She is status post radiation and also chemotherapy.  Currently  on observation treatment.  This is based on independent history from the husband and the patient and also review of the external records.  She is understandably quite frustrated with the onset of all these health issues given the fact she just retired.  Then approximately in September 2023 started noticing significant amount of dyspnea on exertion walking the dog and  climbing stairs.  At this time desaturations with exercise to be noticed.  She has since been using oxygen and this helps her.  She is attended cardiac rehabilitation or pulmonary rehabilitation and this also helped her.  She was then given a diagnosis of pulmonary fibrosis/UIP.  Antifibrotic pirfenidone was started based on her history and also external record review.  However due to some confusion of the paperwork this has not been started yet.  She is here for evaluation of her ILD.  Visualization of CT scan from 2015 shows presence of possible early ILD even back in 2015 with some honeycombing/emphysema at the base.  There was a craniocaudal gradient back pain.   Also of note she just went to a [place] cruise in mid January 2024.  When she came back on October 28, 2022.  Then on October 30, 2022 she had cough headaches no fever.  Her flu test and COVID was negative.  She is being treated for acute bronchitis with Levaquin and IM steroids and 6-day prednisone taper.  She will she was given a diagnosis of pneumonia actually.  She is almost back to baseline but still feels fatigued and still with some cough worse than her baseline. [facility] Comprehensive ILD Questionnaire  Symptoms:  0 she believes she has had much rapid worsening in the last 6 months only.  Suspect this is after her radiation.  Current symptom scores are as below.   SYMPTOM SCALE - ILD 11/13/2022  Current weight   O2 use Exertional walking the dog.  Shortness of Breath 0 -> 5 scale with 5 being worst (score 6 If unable to do)  At rest 0  Simple tasks - showers, clothes change, eating, shaving 3  Household (dishes, doing bed, laundry) 3  Shopping 2  Walking level at own pace 4  Walking up Stairs 4  Total (30-36) Dyspnea Score 16      Non-dyspnea symptoms (0-> 5 scale) 11/13/2022  How bad is your cough? 2 -cough is better after quitting smoking  How bad is your fatigue 2  How bad is nausea 0  How bad is  vomiting?  0  How bad is diarrhea? 0  How bad is anxiety? 3  How bad is depression 1  Any chronic pain - if so where and how bad 0     Past Medical History :  -She was told 20 years ago she had hiatal hernia and acid reflux but she says she has had no issues since then.  It is definitely not described in the current CT chest -She is awaiting a sleep study -She had seizures 12 years ago. -She has hypertension controlled on medication -Currently just finished antibiotic and steroid course-has had COVID-vaccine but never had COVID    ROS:  -Tired and does have dry eyes -Has acid reflux for which she is taking omeprazole  FAMILY HISTORY of LUNG DISEASE:  *-Mother had asthma but there is no family history of lung disease  PERSONAL EXPOSURE HISTORY:   -She smoked between 1973 and 2022.  Smoked 1 pack/day and then quit.  In the 1980s and 1990s very  briefly very rarely she did some cocaine.  She smoked some marijuana between 1976 and 2022 very minimal.  HOME  EXPOSURE and HOBBY DETAILS :  [place]  The home is 66 years old.  She is lived there for 19 years.  She recently got her down comforter 3 months ago but otherwise no organic antigen exposure in the house.  Although there is a leaky roof for the last 1 year in 2021 but there is no mold.  She is retired.  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  INVESTIGATIONS: -As below    HRCT dec 2023 -personally visualized and independently agree  COMPARISON:  Chest CT 06/27/2022.   FINDINGS: Cardiovascular: Heart size is normal. There is no significant pericardial fluid, thickening or pericardial calcification. There is aortic atherosclerosis, as well as  atherosclerosis of the great vessels of the mediastinum and the coronary arteries, including calcified atherosclerotic plaque in the left main, left anterior descending and right coronary arteries.   Mediastinum/Nodes: No pathologically enlarged mediastinal or left hilar lymph nodes. Soft tissue fullness in the right hilar region concerning for underlying lymphadenopathy, but poorly evaluated on today's noncontrast CT examination (best appreciated on axial image 60 of series 2). Esophagus is unremarkable in appearance. No axillary lymphadenopathy.   Lungs/Pleura: Treated lesion in the right lower lobe is now partially obscured by surrounding parenchymal changes, but is estimated to measure approximately 2.1 x 1.4 cm (axial image 114 of series 4). There has been a dramatic increase in surrounding areas of ground-glass attenuation, septal thickening, bronchiectasis and cystic changes, and regional architectural distortion in the surrounding lung parenchyma, presumably reflective of evolving postradiation changes. Several nodular areas of architectural distortion are now noted in the right lung, most notably a macrolobulated nodule with spiculated margins in the right upper lobe (axial image 86 of series 4) measuring 1.9 x 1.3 cm, and a slightly ill-defined 1.9 x 1.1 cm nodular area of architectural distortion in the right lower lobe at the base (axial image 185 of series 4). Small right pleural effusion predominantly lying dependently. No left pleural effusion. In the remaining portions of the lungs are widespread areas of septal thickening, subpleural reticulation, traction bronchiectasis, peripheral bronchiolectasis and honeycombing, most evident in the mid to lower lung distribution. Inspiratory and expiratory imaging is unremarkable. There is also a background of mild diffuse bronchial wall thickening with moderate centrilobular and paraseptal emphysema.   Upper Abdomen: Aortic  atherosclerosis.  Status post cholecystectomy.   Musculoskeletal: There are no aggressive appearing lytic or blastic lesions noted in the visualized portions of the skeleton.  IMPRESSION: 1. The appearance of the lungs is compatible with a combination of emphysema and interstitial lung disease. The interstitial lung component is considered diagnostic of usual interstitial pneumonia (UIP) per current ATS guidelines, progressive compared to remote prior examinations, but stable compared to the most recent prior study. 2. Evolving postradiation changes in the right lung. Treated nodule in the superior segment of the right lower lobe appears grossly similar to the prior study, while there are new nodules in the right lung, most concerning of which is in the right upper lobe (axial image 86 of series 4), currently measuring 1.9 x 1.3 cm. Close attention on follow-up  studies is recommended. These new areas of nodularity could be treatment related or infectious or inflammatory in etiology, however, neoplasm is not excluded. 3. Mild diffuse bronchial wall thickening with moderate centrilobular and paraseptal emphysema; imaging findings suggestive of underlying COPD. 4. Aortic atherosclerosis, in addition to left main and 2 vessel coronary artery disease. Please note that although the presence of coronary artery calcium documents the presence of coronary artery disease, the severity of this disease and any potential stenosis cannot be assessed on this non-gated CT examination. Assessment for potential risk factor modification, dietary therapy or pharmacologic therapy may be warranted, if clinically indicated.   Aortic Atherosclerosis (ICD10-I70.0) and Emphysema (ICD10-J43.9).     Electronically Signed   By: Vinnie Langton M.D.   On: 09/27/2022 07:43     PFT     Latest Ref Rng & Units 01/04/2022    9:51 AM  PFT Results  FVC-Pre L 2.54   FVC-Predicted Pre % 76   FVC-Post L 2.69    FVC-Predicted Post % 81   Pre FEV1/FVC % % 73   Post FEV1/FCV % % 71   FEV1-Pre L 1.84   FEV1-Predicted Pre % 72   FEV1-Post L 1.91   DLCO uncorrected ml/min/mmHg 9.15   DLCO UNC% % 44   DLCO corrected ml/min/mmHg 9.15   DLCO COR %Predicted % 44   DLVA Predicted % 55   TLC L 3.94   TLC % Predicted % 75   RV % Predicted % 62      Latest Reference Range & Units 09/29/22 11:39  Anti Nuclear Antibody (ANA) NEGATIVE  POSITIVE !  ANA Pattern 1  Cytoplasmic !  ANA Titer 1 titer 1:40 (H)  ANCA SCREEN Negative  Negative  Angiotensin-Converting Enzyme 9 - 67 U/L 17  Cyclic Citrullin Peptide Ab UNITS <16  ds DNA Ab IU/mL <1  RA Latex Turbid. <14 IU/mL <14  Scleroderma (Scl-70) (ENA) Antibody, IgG <1.0 NEG AI <1.0 NEG  !: Data is abnormal (H): Data is abnormally high   CANCER HX - Jan 2024 visit with Dr Diane [name]   DIAGNOSIS: Unresectable stage IIB (T2a, N1, M0) non-small cell lung cancer, squamous cell carcinoma presented with right lower lobe lung mass in addition to right hilar lymphadenopathy diagnosed in March 2023.     The patient had PD-L1 expression of 99%.   PRIOR THERAPY: A course of concurrent chemoradiation with weekly carboplatin for AUC of 2 and paclitaxel 45 Mg/M2.  First dose started 01/16/2022.  Status post 7 cycles.  Last dose was given on 02/27/2022.   CURRENT THERAPY: Observation. - can showed no concerning findings for disease progression in the area of concern showed moderate metabolic activity that are nonspecific and may be related to infectious or inflammatory process but neoplasm could not be completely excluded. I recommended for the patient to continue on observation with repeat CT scan of the chest in 3 months for restaging of her disease.  xxxxxxxxxxxxxx   has a past medical history of Anxiety, Arthritis, C. difficile diarrhea (2015), COPD (chronic obstructive pulmonary disease) ([place]), Depression, Eczema, H pylori ulcer, Headache(784.0),  Hypercholesterolemia, Hypertension, Kidney stones, Pneumonia, Seizures ([place]), and Shingles.   reports that she quit smoking about 13 months ago. Her smoking use included cigarettes. She has a 15.00 pack-year smoking history. She has never used smokeless tobacco.  Past Surgical History:  Procedure Laterality Date   ABDOMINAL HYSTERECTOMY     ADENOIDECTOMY     APPENDECTOMY     BRONCHIAL BIOPSY  12/20/2021   Procedure: BRONCHIAL BIOPSIES;  Surgeon: Garner Nash, DO;  Location: [facility];  Service: Pulmonary;;   BRONCHIAL BRUSHINGS  12/20/2021   Procedure: BRONCHIAL BRUSHINGS;  Surgeon: Garner Nash, DO;  Location: [facility];  Service: Pulmonary;;   BRONCHIAL NEEDLE ASPIRATION BIOPSY  12/20/2021   Procedure: BRONCHIAL NEEDLE ASPIRATION BIOPSIES;  Surgeon: Garner Nash, DO;  Location: [facility];  Service: Pulmonary;;   CESAREAN SECTION     CHOLECYSTECTOMY     COLONOSCOPY  06/10/2012   RMR: Colonic polyps -removed as described above.    FINE NEEDLE ASPIRATION  12/20/2021   Procedure: FINE NEEDLE ASPIRATION (FNA) LINEAR;  Surgeon: Garner Nash, DO;  Location: [facility];  Service: Pulmonary;;   KNEE ARTHROSCOPY  1973   right knee,  torn cart   LUMBAR DISC SURGERY  05/12/2015   L4 L5   LUMBAR LAMINECTOMY/DECOMPRESSION MICRODISCECTOMY Left 05/12/2015   Procedure: Left L4-5 Microdiscectomy;  Surgeon: Marybelle Killings, MD;  Location: [facility];  Service: Orthopedics;  Laterality: Left;   TONSILLECTOMY     VIDEO BRONCHOSCOPY WITH ENDOBRONCHIAL ULTRASOUND Bilateral 12/20/2021   Procedure: VIDEO BRONCHOSCOPY WITH ENDOBRONCHIAL ULTRASOUND;  Surgeon: Garner Nash, DO;  Location: [facility];  Service: Pulmonary;  Laterality: Bilateral;   VIDEO BRONCHOSCOPY WITH RADIAL ENDOBRONCHIAL ULTRASOUND  12/20/2021   Procedure: RADIAL ENDOBRONCHIAL ULTRASOUND;  Surgeon: Garner Nash, DO;  Location: [facility];  Service: Pulmonary;;    Allergies  Allergen Reactions   Alpha-Gal     Other  reaction(s): Abdominal Pain   Other Other (See Comments)   Penicillins     Unknown type of reaction   Prozac [Fluoxetine] Rash    Immunization History  Administered Date(s) Administered   Fluad Quad(high Dose 65+) 08/08/2022   Influenza, Quadrivalent, Recombinant, Inj, Pf 07/24/2019   Influenza,inj,Quad PF,6+ Mos 07/15/2016, 08/03/2017, 08/29/2018   Influenza-Unspecified 07/16/2021   Moderna Sars-Covid-2 Vaccination 12/25/2019, 01/24/2020   PNEUMOCOCCAL CONJUGATE-20 08/08/2022   Pneumococcal Polysaccharide-23 09/29/2013   RSV,unspecified 09/14/2022    Family History  Problem Relation Age of Onset   Arthritis Mother    Asthma Mother    Depression Mother    Hyperlipidemia Mother    Varicose Veins Mother    Arthritis Father    Heart disease Father    Hyperlipidemia Father    Vision loss Father    Cancer Sister    COPD Sister    Early death Sister    Cancer Brother    Alcohol abuse Maternal Grandfather    Colon cancer Neg Hx      Current Outpatient Medications:    ALPRAZolam (XANAX) 0.5 MG tablet, Take 0.5 mg by mouth daily as needed., Disp: , Rfl:    cetirizine (ZYRTEC) 10 MG tablet, Take 10 mg by mouth at bedtime., Disp: , Rfl:    EPINEPHrine (AUVI-Q) 0.3 mg/0.3 mL IJ SOAJ injection, Inject 0.3 mLs (0.3 mg total) into the muscle as needed for anaphylaxis., Disp: 2 each, Rfl: 1   fluticasone (FLONASE) 50 MCG/ACT nasal spray, Place 2 sprays into both nostrils daily. (Patient taking differently: Place 2 sprays into both nostrils daily as needed for allergies.), Disp: 16 g, Rfl: 5   hydrochlorothiazide (MICROZIDE) 12.5 MG capsule, Take 12.5 mg by mouth daily., Disp: , Rfl:    hydrOXYzine (ATARAX) 25 MG tablet, Take 25 mg by mouth daily., Disp: , Rfl:    omeprazole (PRILOSEC) 20 MG capsule, Take 1 capsule (20 mg total) by mouth 2 (two) times daily. (Needs to be  seen before next refill), Disp: 60 capsule, Rfl: 0   OVER THE COUNTER MEDICATION, Take 1 capsule by mouth daily.  Probiotic, Disp: , Rfl:    rosuvastatin (CRESTOR) 10 MG tablet, Take 10 mg by mouth at bedtime., Disp: , Rfl:    umeclidinium-vilanterol (ANORO ELLIPTA) 62.5-25 MCG/ACT AEPB, Inhale 1 puff into the lungs daily., Disp: 60 each, Rfl: 3      Objective:   Vitals:   11/13/22 1057  BP: 120/64  Weight: 146 lb 9.6 oz (66.5 kg)  Height: 5\' 5"  (1.651 m)    Estimated body mass index is 24.4 kg/m as calculated from the following:   Height as of this encounter: 5\' 5"  (1.651 m).   Weight as of this encounter: 146 lb 9.6 oz (66.5 kg).  @WEIGHTCHANGE @  [employer]   11/13/22 1057  Weight: 146 lb 9.6 oz (66.5 kg)     Physical Exam  General: No distress. Looks well. Some cough Neuro: Alert and Oriented x 3. GCS 15. Speech normal Psych: Pleasant Resp:  Barrel Chest - no.  Wheeze - no, Crackles - mild basen, No overt respiratory distress CVS: Normal heart sounds. Murmurs - no Ext: Stigmata of Connective Tissue Disease - no HEENT: Normal upper airway. PEERL +. No post nasal drip        Assessment:       ICD-10-CM   1. IPF (idiopathic pulmonary fibrosis) (HCC)  J84.112 Alpha-1 antitrypsin phenotype    QuantiFERON-TB Gold Plus    B Nat Peptide    AMB referral to pulmonary rehabilitation    2. Pulmonary emphysema, unspecified emphysema type ([place])  J43.9 AMB referral to pulmonary rehabilitation    3. UIP (usual interstitial pneumonitis) ([place])  J84.112     4. History of lung cancer  Z85.118     5. Dyspnea, unspecified type  R06.00 B Nat Peptide     She has IPF.  Possible onset is 22.  There is clear progression within the last 1 year based on history but also suspect that radiation was a risk factor in the recent progression but UIP itself is a marker for progression.  Along with a smoking history and age of 89 and associated emphysema the diagnoses IPF especially with serologies being negative.  Currently having exercise hypoxemia.  She definitely meets indication for  antifibrotic therapy.  Discussed the 2 therapies in detail nintedanib and pirfenidone.  We went with pirfenidone because it was already recommended.  Also get an echocardiogram and BNP rule out pulmonary hypertension  Because of his emphysema we will get alpha-1 antitrypsin   Rereferred to pulmonary rehab  Also well-connected with patient support group.  Rec mended to keep pulse ox greater than 86% and exercise accordingly.    Plan:     Patient Instructions     ICD-10-CM   1. IPF (idiopathic pulmonary fibrosis) (HCC)  J84.112 Alpha-1 antitrypsin phenotype    QuantiFERON-TB Gold Plus    B Nat Peptide    AMB referral to pulmonary rehabilitation    2. Pulmonary emphysema, unspecified emphysema type ([place])  J43.9 AMB referral to pulmonary rehabilitation    3. UIP (usual interstitial pneumonitis) ([place])  J84.112     4. History of lung cancer  Z85.118     5. Dyspnea, unspecified type  R06.00 B Nat Peptide     The diagnosis is IPF along with emphysema. REsolving acute bronchtiis - probably need another prednisone  Plan - Please take prednisone 40 mg x1 day, then 30 mg  x1 day, then 20 mg x1 day, then 10 mg x1 day, and then 5 mg x1 day and stop - check blood alpha 1 AT check, quantiferon gold and BNP - get echo - get full PFT next 6 weeks - start esbriet per protocol - continue anoro per protocol - re-refer pulmonary rehab - continue home exercise but keep pulse ox > = 86% - connect with Marlane Mingle patient support group chair - [email or web address]  Followup  6 weeks with Eric Form or Dr Diane [name] to assess progress  ( Level 05 visit: Estb 40-54 min in  visit type: on-site physical face to visit  in total care time and counseling or/and coordination of care by this undersigned MD - Dr Diane [name]. This includes one or more of the following on this same day 11/13/2022: pre-charting, chart review, note writing, documentation discussion of test results, diagnostic or treatment  recommendations, prognosis, risks and benefits of management options, instructions, education, compliance or risk-factor reduction. It excludes time spent by the [place] or office staff in the care of the patient. Actual time 50 min)   SIGNATURE    Dr. Brand [name], M.D., F.C.C.P,  Pulmonary and Critical Care Medicine Staff Physician, [place] Director - Interstitial Lung Disease  Program  Pulmonary [place] [facility] [place], [place], [address]  Pager: [phone], If no answer or between  15:00h - 7:00h: call [phone] Telephone: [phone]  11:37 AM 11/13/2022

## 2022-11-15 ENCOUNTER — Ambulatory Visit (HOSPITAL_COMMUNITY)
Admission: RE | Admit: 2022-11-15 | Discharge: 2022-11-15 | Disposition: A | Discharge status: Home / Self Care | Payer: Medicare Other | Source: Ambulatory Visit | Attending: Internal Medicine | Admitting: Internal Medicine

## 2022-11-15 DIAGNOSIS — R0600 Dyspnea, unspecified: Secondary | ICD-10-CM | POA: Diagnosis present

## 2022-11-15 DIAGNOSIS — J439 Emphysema, unspecified: Secondary | ICD-10-CM

## 2022-11-15 DIAGNOSIS — J84112 Idiopathic pulmonary fibrosis: Secondary | ICD-10-CM | POA: Diagnosis present

## 2022-11-15 DIAGNOSIS — R0609 Other forms of dyspnea: Secondary | ICD-10-CM

## 2022-11-15 DIAGNOSIS — Z85118 Personal history of other malignant neoplasm of bronchus and lung: Secondary | ICD-10-CM | POA: Diagnosis present

## 2022-11-15 LAB — QUANTIFERON-TB GOLD PLUS
Mitogen-NIL: >10 IU/mL
NIL: 0.01 IU/mL
QuantiFERON-TB Gold Plus: NEGATIVE
TB1-NIL: 0 IU/mL
TB2-NIL: 0 IU/mL

## 2022-11-15 LAB — ECHOCARDIOGRAM COMPLETE
AR max vel: 1.36 cm2
AV Area VTI: 1.31 cm2
AV Area mean vel: 1.35 cm2
AV Mean grad: 7 mmHg
AV Peak grad: 14.6 mmHg
Ao pk vel: 1.91 m/s
Area-P 1/2: 2.99 cm2
P 1/2 time: 530 msec
S' Lateral: 2.7 cm

## 2022-11-15 NOTE — Progress Notes (Signed)
*  PRELIMINARY RESULTS* Echocardiogram 2D Echocardiogram has been performed.  Diane [name] 11/15/2022, 2:49 PM

## 2022-11-18 LAB — ALPHA-1 ANTITRYPSIN PHENOTYPE: A-1 Antitrypsin, Ser: 142 mg/dL (ref 83–199)

## 2022-11-21 ENCOUNTER — Ambulatory Visit: Payer: Medicare Other

## 2022-11-21 DIAGNOSIS — G4700 Insomnia, unspecified: Secondary | ICD-10-CM

## 2022-11-21 DIAGNOSIS — G4733 Obstructive sleep apnea (adult) (pediatric): Secondary | ICD-10-CM

## 2022-11-24 ENCOUNTER — Encounter: Payer: Self-pay | Admitting: Internal Medicine

## 2022-11-24 DIAGNOSIS — G4733 Obstructive sleep apnea (adult) (pediatric): Secondary | ICD-10-CM | POA: Diagnosis not present

## 2022-11-24 NOTE — Telephone Encounter (Signed)
Pharmacy team/April, do you know the status of this. Pt was seen on 1/29 and the enrollment form was completed. Thanks.

## 2022-11-27 NOTE — Telephone Encounter (Signed)
This has already been addressed in [place] encounter last week. Patient sent duplicate message - messaged her back in other encounter. Thanks   Knox Saliva, PharmD, MPH, BCPS, CPP Clinical Pharmacist (Rheumatology and Pulmonology)

## 2022-11-27 NOTE — Telephone Encounter (Signed)
Spoke with [employer] regarding Esbriet PAP application. pEr rep, provider form was cut off. Refaxed today  Fax# [phone] Phone# [phone]  Knox Saliva, PharmD, MPH, BCPS, CPP Clinical Pharmacist (Rheumatology and Pulmonology)

## 2022-11-30 MED ORDER — PIRFENIDONE 267 MG PO TABS: ORAL_TABLET | ORAL | 0 refills | Status: DC

## 2022-11-30 NOTE — Telephone Encounter (Signed)
Received fax from [employer] for [facility] patient assistance, patient's application has been DENIED due to exceeding income criteria (patient states this is the reason though it is not stated on letter).   Phone: [phone]  I spoke with patient as she is aware of denial. Discussed option of filling through insurance or filling through [employer]. MyChart message has been sent to patient with details of [facility] (she will call to determine actually copay as we have been unable to run test claim). 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 She and her husband will further discuss if they'd like to pay for pirfenidone through insurance or otherwise use [facility]. Will await follow-up from patient.  Knox Saliva, PharmD, MPH, BCPS, CPP Clinical Pharmacist (Rheumatology and Pulmonology)

## 2022-12-01 MED ORDER — PIRFENIDONE 801 MG PO TABS: ORAL_TABLET | ORAL | 2 refills | Status: DC

## 2022-12-01 MED ORDER — PIRFENIDONE 267 MG PO TABS: ORAL_TABLET | ORAL | 0 refills | Status: DC

## 2022-12-01 NOTE — Telephone Encounter (Signed)
Patient has decided to fill with [facility]. Rx sent to [facility] with her email in prescription. Email will have to be included with every prescription so pharmacy can link to her account.  Email: [email or web address]  Knox Saliva, PharmD, MPH, BCPS, CPP Clinical Pharmacist (Rheumatology and Pulmonology)

## 2022-12-04 DIAGNOSIS — J84112 Idiopathic pulmonary fibrosis: Secondary | ICD-10-CM

## 2022-12-04 DIAGNOSIS — R0600 Dyspnea, unspecified: Secondary | ICD-10-CM

## 2022-12-04 DIAGNOSIS — G4733 Obstructive sleep apnea (adult) (pediatric): Secondary | ICD-10-CM

## 2022-12-12 ENCOUNTER — Telehealth: Payer: Self-pay | Admitting: Internal Medicine

## 2022-12-12 NOTE — Telephone Encounter (Signed)
  Echo is normal but mild heart muscle stiffness that is c.w age   MPRESSIONS     1. Left ventricular ejection fraction, by estimation, is 60 to 65%. The  left ventricle has normal function. The left ventricle has no regional  wall motion abnormalities. Left ventricular diastolic parameters are  consistent with Grade I diastolic  dysfunction (impaired relaxation).   2. Right ventricular systolic function is normal. The right ventricular  size is normal. There is normal pulmonary artery systolic pressure. The  estimated right ventricular systolic pressure is 37.9 mmHg.   3. The mitral valve is normal in structure. No evidence of mitral valve  regurgitation. No evidence of mitral stenosis.   4. The aortic valve is tricuspid. Aortic valve regurgitation is mild to  moderate. No aortic stenosis is present.   5. The inferior vena cava is normal in size with greater than 50%  respiratory variability, suggesting right atrial pressure of 3 mmHg.   Comparison(s): No prior Echocardiogram.

## 2022-12-12 NOTE — Telephone Encounter (Signed)
Please refer to encounter from 2/19.

## 2022-12-12 NOTE — Telephone Encounter (Signed)
Pt sent mychart message checking on results of recent sleep study. Sarah, please advise on this for pt.

## 2022-12-12 NOTE — Telephone Encounter (Signed)
Called and spoke with pt letting her know the results of recent echo and she verbalized understanding. Nothing further needed. 

## 2022-12-13 ENCOUNTER — Ambulatory Visit (HOSPITAL_COMMUNITY)
Admission: RE | Admit: 2022-12-13 | Discharge: 2022-12-13 | Disposition: A | Discharge status: Home / Self Care | Payer: Medicare Other | Source: Ambulatory Visit | Attending: Acute Care | Admitting: Acute Care

## 2022-12-13 ENCOUNTER — Ambulatory Visit: Payer: Medicare Other

## 2022-12-13 ENCOUNTER — Other Ambulatory Visit: Payer: Self-pay | Admitting: Acute Care

## 2022-12-13 ENCOUNTER — Telehealth: Payer: Self-pay | Admitting: Acute Care

## 2022-12-13 DIAGNOSIS — R0600 Dyspnea, unspecified: Secondary | ICD-10-CM | POA: Insufficient documentation

## 2022-12-13 DIAGNOSIS — J181 Lobar pneumonia, unspecified organism: Secondary | ICD-10-CM

## 2022-12-13 MED ORDER — DOXYCYCLINE HYCLATE 100 MG PO TABS: 100.0000 mg | ORAL_TABLET | Freq: Two times a day (BID) | ORAL | 0 refills | Status: DC

## 2022-12-13 MED ORDER — PREDNISONE 10 MG PO TABS: 10.0000 mg | ORAL_TABLET | Freq: Every day | ORAL | 0 refills | Status: DC

## 2022-12-13 NOTE — Telephone Encounter (Signed)
Patient called today to get her sleep study results. Sleep study shows mild OSA with AHI of 9.1. Average saturation was 89%, nadir was 69%. Recommendation was for mouth guard or CPAP. With patient history of lung cancer and pulmonary fibrosis, and chronic hypoxic respiratory  I have recommended CPAP therapy. She is in agreement. Orders be placed. There was option for cpap titration study vs starting Auto CPAP. I am starting therapy with Auto Set 5-15 cm H20 as I want to get patient started on therapy. We will get a down Load after a month of therapy and see if we need to fine tune her settings.  While I was talking with the patient she was short of breath and shared that she had increased oxygen demands. She said this has been going on for several weeks since she returned from  a cruise.  I ordered a CXR at [facility] (Closer to patient ).I also ordered a home tank with a higher liter flow capacity. CXR Results today confirmed >>  Diffuse bilateral interstitial pulmonary opacity, increased compared to prior examination, consistent with edema or infection. No focal airspace opacity. Unchanged post treatment appearance of the right midlung.   She denies any edema. Recent Echo shows EF of 60-65%, LV normal function, Left ventricular diastolic parameters are  consistent with Grade I diastolic  dysfunction (impaired relaxation) , RV function is WNL, There is normal pulmonary artery systolic pressure. The  estimated right ventricular systolic pressure is [id number] mmHg. MV, normal, AV with mild to moderate regurgitation, no aortic stenosis. I am favoring infection.  I have called [name] with the results of her CXR. I have send in a prescription for Doxycycline 100 mg BID x 7 days and a prednisone taper Prednisone taper; 10 mg tablets: 4 tabs x 2 days, 3 tabs x 2 days, 2 tabs x 2 days 1 tab x 2 days then stop. She has a penicillin allergy.  I have asked her to start Mucinex 1200 mg each morning with a glass of water  and start using her flutter valve. She states her secretions are thick and hard to get up. I have asked her to use her albuterol nebs in the morning and evening while she is sick, and to wear her oxygen at bedtime at 2  liters. When I asked if she has had a fever, she said she did not think so, but when I asked if she had chills , she said she has had a lot of couch time covered in blankets, so endorses that she may have had fever.  I have told her to check with me Friday so we can see if she is any better. We discussed that if she does not start turning around soon, we may need to consider IV antibiotics. We discussed if she gets worse , not better to call the office  or seek emergency care. She verbalized understanding. She does have a follow up CT Chest scheduled 01/25/2023 per oncology   Delphina Cahill said she has sent a detailed message about some issues with DME and getting her CPAP.  She needs a follow up with me in 1 week  Thanks so much for all your help.

## 2022-12-13 NOTE — Telephone Encounter (Signed)
Order has been placed for pt to have a STAT cxr performed at [facility]. CPAP order has been placed as well as the order for pt to receive a 10L home concentrator. Message sent to pt of all the results. Nothing further needed.

## 2022-12-14 NOTE — Telephone Encounter (Signed)
Spoke with the pt's spouse and scheduled appt for 12/20/22 at 1:30 pm.

## 2022-12-14 NOTE — Telephone Encounter (Signed)
Looking at schedule, there is no opening with Judson Roch in 1 week. Please advise what you recommend. Pt went to get the xray done yesterday 2/28 and the results should be available for view.  Pt does have a follow up scheduled 3/12 having PFT and then follow up with Dr. Chase Caller. Looking at [facility] schedule, first availability with her is not until after pt will have already  had f/u with Dr. Chase Caller. Sarah, please advise.

## 2022-12-14 NOTE — Telephone Encounter (Signed)
Magdalen Spatz, NP  You28 minutes ago (9:11 AM)    Raquel Sarna, If there is staff to work with me , I could see her 1:30 on Wednesday March 6th in the office as part of my admin time. Will that work. I called her yesterday evening with her CXR results and sent  her in pred and doxy. I really want to see her in 3/6 if possible. I think waiting until 3/12 is too long as she is sick. Thanks    Routing this to [place] for her to review in regards to 3/6.

## 2022-12-19 NOTE — Telephone Encounter (Signed)
PCCs, two new DME orders have been placed for O2 and CPAP to go to [facility]. Originally the orders were sent to [facility] by mistake. Thanks.

## 2022-12-19 NOTE — Addendum Note (Signed)
Addended by: Collier Salina on: 12/19/2022 03:09 PM   Modules accepted: Orders

## 2022-12-20 ENCOUNTER — Telehealth: Payer: Self-pay | Admitting: Acute Care

## 2022-12-20 ENCOUNTER — Ambulatory Visit (INDEPENDENT_AMBULATORY_CARE_PROVIDER_SITE_OTHER): Payer: Medicare Other | Admitting: Acute Care

## 2022-12-20 ENCOUNTER — Encounter: Payer: Self-pay | Admitting: Acute Care

## 2022-12-20 VITALS — BP 108/60 | HR 87 | Temp 97.8°F | Ht 65.0 in | Wt 148.2 lb

## 2022-12-20 DIAGNOSIS — J9621 Acute and chronic respiratory failure with hypoxia: Secondary | ICD-10-CM

## 2022-12-20 DIAGNOSIS — Z87891 Personal history of nicotine dependence: Secondary | ICD-10-CM

## 2022-12-20 DIAGNOSIS — G4733 Obstructive sleep apnea (adult) (pediatric): Secondary | ICD-10-CM | POA: Diagnosis not present

## 2022-12-20 DIAGNOSIS — Z85118 Personal history of other malignant neoplasm of bronchus and lung: Secondary | ICD-10-CM | POA: Diagnosis not present

## 2022-12-20 DIAGNOSIS — J849 Interstitial pulmonary disease, unspecified: Secondary | ICD-10-CM | POA: Diagnosis not present

## 2022-12-20 DIAGNOSIS — R051 Acute cough: Secondary | ICD-10-CM

## 2022-12-20 DIAGNOSIS — J181 Lobar pneumonia, unspecified organism: Secondary | ICD-10-CM

## 2022-12-20 MED ORDER — HYDROCODONE BIT-HOMATROP MBR 5-1.5 MG/5ML PO SOLN: 5.0000 mL | Freq: Four times a day (QID) | ORAL | 0 refills | Status: DC | PRN

## 2022-12-20 MED ORDER — PREDNISONE 10 MG PO TABS: 10.0000 mg | ORAL_TABLET | Freq: Every day | ORAL | 0 refills | Status: AC

## 2022-12-20 NOTE — Progress Notes (Signed)
Has had all [place] vaccines.

## 2022-12-20 NOTE — Patient Instructions (Addendum)
It is good to see you today. Continue prednisone taper until gone.  I am so glad you are feeling better.  I will send in an additional 7 days of prednisone in case you need it for shortness of breath or wheezing. I will send in Hycodan cough syrup. Take 5 mLs by mouth every 6 (six) hours as needed for cough  Do not drive if sleepy Continue Anoro 1 puff once daily as needed.  Rinse mouth after use Use Albuteropl as needed for shortness of breath or wheezing. Continue using the Mucinex 1200 mg daily for chest congestion  Flutter valve as needed for chest congestion.  We will call and check on the bigger capacity concentrator for your home I will place an order for 2 L [place] as bleed in during CPAP use.  Continue pirfenidone as you have been doing Take 1 tab three times daily for 7 days, then 2 tabs three times daily for 7 days, then 3 tabs three times daily thereafter  Call if you have worsening stomach upset. Follow up CT chest 01/25/2023 as planned  Call for any worsening in breathing and increased oxygen demand.  Please contact office for sooner follow up if symptoms do not improve or worsen or seek emergency care

## 2022-12-20 NOTE — Progress Notes (Signed)
History of Present Illness Diane [name] is a 67 y.o. female  former smoker diagnosed with unresectable stage IIb (T2 a, N1, M0) non-small cell lung cancer, squamous cell carcinoma with right lower lobe lung mass in addition to right hilar lymphadenopathy in March 2023.She underwent concurrent chemo-radiation, last dosing 02/27/2022. She is undergoing CT surveillance with Dr. Julien [name] in Medical oncology .She is followed by pulmonary for her bronchiectasis, ILD  and emphysema.  She is followed by Dr. Valeta [name]. And Dr. Chase [name] for her ILD.  Maintenance Anoro Rescue Albuterol nebs and inhaler Home Oxygen>>Patient Saturations on Room Air at Rest = 97% Patient Saturations on Room Air while Ambulating = 85%  Patient Saturations on 2 Liters of [name] oxygen while Ambulating = 93% 12/13/2022>> Larger capacity concentrator ordered from DME OSA on CPAP with oxygen at 2 L bled in  Cancer History DIAGNOSIS: Unresectable stage IIB (T2a, N1, M0) non-small cell lung cancer, squamous cell carcinoma presented with right lower lobe lung mass in addition to right hilar lymphadenopathy diagnosed in March 2023.     The patient had PD-L1 expression of 99%.   PRIOR THERAPY: A course of concurrent chemoradiation with weekly carboplatin for AUC of 2 and paclitaxel 45 Mg/M2.  First dose started 01/16/2022.  Status post 7 cycles.  Last dose was given on 02/27/2022.   CURRENT THERAPY: Observation. - scan showed no concerning findings for disease progression in the area of concern showed moderate metabolic activity that are nonspecific and may be related to infectious or inflammatory process but neoplasm could not be completely excluded. Plan is for the patient to continue on observation with repeat CT scan of the chest in 3 months for restaging of her disease.( Scheduled April 11,2024)   Synopsis of recent medical history Patient called the office 12/13/2022  to get her sleep study results.  While I was talking with  the patient she was short of breath and shared that she had increased oxygen demands. She said this has been going on for several weeks since she returned from  a cruise she took with friends to celebrate finishing chemo and radiation . This was a [place] cruise in January 2024. She returned October 28, 2022. Then on October 30, 2022 she had cough headaches no fever. Her flu test and COVID was negative. She was treated for acute bronchitis and pneumonia  with Levaquin and IM steroids and 6-day prednisone taper.  She was feeling better but still fatigued and had a and still with some cough worse than her baseline  when she saw Dr. Chase [name]  11/13/2022.  I ordered a CXR at [facility] (Closer to patient ).I also ordered a home tank with a higher liter flow capacity as her oxygen demand was so much higher.. CXR Results 2/28 confirmed >>  Diffuse bilateral interstitial pulmonary opacity, increased compared to prior examination, consistent with edema or infection. No focal airspace opacity. Unchanged post treatment appearance of the right midlung.I called the patient with the results of her scan and sent in prescriptions for Doxycycline 100 mg BID x 7 days and a prednisone taper Prednisone taper; 10 mg tablets: 4 tabs x 2 days, 3 tabs x 2 days, 2 tabs x 2 days 1 tab x 2 days then stop. She has a penicillin allergy, and history of QT prolongation .  I  asked her to start Mucinex 1200 mg each morning with a glass of water and start using her flutter valve. She states her secretions are thick and hard  to get up. I have asked her to use her albuterol nebs in the morning and evening while she is sick, and to wear her oxygen at bedtime at 2  liters. When I asked if she has had a fever, she said she did not think so, but when I asked if she had chills , she said she has had a lot of couch time covered in blankets, so endorses that she may have had fever. I asked Diane [name] to check in with me Friday 12/15/2022 to ensure she was  improving. We discussed that if she does not start turning around soon, we may need to consider IV antibiotics. We discussed if she gets worse , not better to call the office  or seek emergency care.  I reviewed her last echo from 11/15/2022, which shows EF of 60-65%, LV normal function, Left ventricular diastolic parameters are consistent with Grade I diastolic  dysfunction. She denied any lower extremity edema. She does have a follow up CT Chest scheduled 01/25/2023 per oncology,I plan to  order one earlier if she does not improve.      12/20/2022 Pt. Presents for short term follow up of multifocal pneumonia noted on CXR 12/13/2022. She was treated with Doxycycline and prednisone taper 12/13/2022 for her slow to resolve pneumonia . She states she is feeling much better. She states it is like night and day. She states she is back to her baseline. She is using 3 L Diane [name] at rest and 4 L with exertion. Her oxygen demand is back to her baseline and she has her energy back. She does still have a cough, which is productive . She coughed in the office and she had some blood streaks in it. She states this is new, and she thinks it is from coughing. Secretions are mostly clear with some tan bits. She denies fever or chest pain. She has completed the Doxycycline and has her last dose of prednisone tomorrow. I will give her an additional 7 days of 10 mg prednisone daily to cover her until she sees Dr. Chase [name] next  week.  She started perfididone Monday 12/18/2022. I have told her to let us know if she has any stomach upset.  She is much more compliant with her oxygen use. She had her oxygen on today in the office. She is monitoring her oxygen levels. She is compliant with her Anoro daily. She is using her albuterol as needed. She has been using her Mucinex with flutter valve for secretion mobilization.   She does have headaches, which I think are from her cough. I have prescribed her cough medication with Codeine to see if  that resolves the headaches.   She is still waiting in her CPAP machine and her 10 L home oxygen concentrator. My nurse will check with [employer] about the delay.     Test Results: CXR 12/13/2022 CXR Results t2/28 confirmed >>  Diffuse bilateral interstitial pulmonary opacity, increased compared to prior examination, consistent with edema or infection. No focal airspace opacity. Unchanged post treatment appearance of the right midlung  Echo done 11/15/2022 Recent Echo shows EF of 60-65%, LV normal function, Left ventricular diastolic parameters are  consistent with Grade I diastolic  dysfunction (impaired relaxation) , RV function is WNL, There is normal pulmonary artery systolic pressure. The  estimated right ventricular systolic pressure is [id number] mmHg. MV, normal, AV with mild to moderate regurgitation, no aortic stenosis  Home Sleep Study 11/21/2022 Sleep study showed mild OSA with AHI of  9.1. Average saturation was 89%, nadir was 69%. Recommendation was for mouth guard or CPAP. With patient history of lung cancer and pulmonary fibrosis, and chronic hypoxic respiratory  I have recommended CPAP therapy on AutoSet, as I want to get her started on therapy.We will get a down Load after a month of therapy and see if we need to fine tune her settings.   HRCT 09/27/2022 The appearance of the lungs is compatible with a combination of emphysema and interstitial lung disease. The interstitial lung component is considered diagnostic of usual interstitial pneumonia (UIP) per current ATS guidelines, progressive compared to remote prior examinations, but stable compared to the most recent prior study. 2. Evolving postradiation changes in the right lung. Treated nodule in the superior segment of the right lower lobe appears grossly similar to the prior study, while there are new nodules in the right lung, most concerning of which is in the right upper lobe (axial image 86 of series 4), currently measuring 1.9  x 1.3 cm. Close attention on follow-up studies is recommended. These new areas of nodularity could be treatment related or infectious or inflammatory in etiology, however, neoplasm is not excluded. 3. Mild diffuse bronchial wall thickening with moderate centrilobular and paraseptal emphysema; imaging findings suggestive of underlying COPD. 4. Aortic atherosclerosis, in addition to left main and 2 vessel coronary artery disease. Please note that although the presence of coronary artery calcium documents the presence of coronary artery disease, the severity of this disease and any potential stenosis cannot be assessed on this non-gated CT examination. Assessment for potential risk factor modification, dietary therapy or pharmacologic therapy may be warranted, if clinically indicated.   Aortic Atherosclerosis (ICD10-I70.0) and Emphysema (ICD10-J43.9).   PFT       Latest Ref Rng & Units 01/04/2022    9:51 AM  PFT Results  FVC-Pre L 2.54   FVC-Predicted Pre % 76   FVC-Post L 2.69   FVC-Predicted Post % 81   Pre FEV1/FVC % % 73   Post FEV1/FCV % % 71   FEV1-Pre L 1.84   FEV1-Predicted Pre % 72   FEV1-Post L 1.91   DLCO uncorrected ml/min/mmHg 9.15   DLCO UNC% % 44   DLCO corrected ml/min/mmHg 9.15   DLCO COR %Predicted % 44   DLVA Predicted % 55   TLC L 3.94   TLC % Predicted % 75   RV % Predicted % 62         Latest Reference Range & Units 09/29/22 11:39  Anti Nuclear Antibody (ANA) NEGATIVE  POSITIVE !  ANA Pattern 1   Cytoplasmic !  ANA Titer 1 titer 1:40 (H)  ANCA SCREEN Negative  Negative  Angiotensin-Converting Enzyme 9 - 67 U/L 17  Cyclic Citrullin Peptide Ab UNITS <16  ds DNA Ab IU/mL <1  RA Latex Turbid. <14 IU/mL <14  Scleroderma (Scl-70) (ENA) Antibody, IgG <1.0 NEG AI <1.0 NEG        Latest Ref Rng & Units 09/26/2022    9:53 AM 06/27/2022   10:08 AM 03/23/2022   10:11 AM  CBC  WBC 4.0 - 10.5 K/uL 6.4  6.0  5.5   Hemoglobin 12.0 - 15.0 g/dL 12.6  13.4   12.0   Hematocrit 36.0 - 46.0 % 36.8  39.4  35.3   Platelets 150 - 400 K/uL 303  303  271        Latest Ref Rng & Units 09/26/2022    9:53 AM 06/27/2022   10:08  AM 03/23/2022   10:11 AM  BMP  Glucose 70 - 99 mg/dL 99  111  97   BUN 8 - 23 mg/dL '12  16  10   '$ Creatinine 0.44 - 1.00 mg/dL 0.65  0.76  0.61   Sodium 135 - 145 mmol/L 137  140  137   Potassium 3.5 - 5.1 mmol/L 4.1  4.1  4.1   Chloride 98 - 111 mmol/L 102  102  100   CO2 22 - 32 mmol/L '28  31  29   '$ Calcium 8.9 - 10.3 mg/dL 9.8  10.3  9.8     BNP No results found for: "BNP"  ProBNP    Component Value Date/Time   PROBNP 55.0 11/13/2022 1138    PFT    Component Value Date/Time   FEV1PRE 1.84 01/04/2022 0951   FEV1POST 1.91 01/04/2022 0951   FVCPRE 2.54 01/04/2022 0951   FVCPOST 2.69 01/04/2022 0951   TLC 3.94 01/04/2022 0951   DLCOUNC 9.15 01/04/2022 0951   PREFEV1FVCRT 73 01/04/2022 0951   PSTFEV1FVCRT 71 01/04/2022 0951    DG Chest 2 View  Result Date: 12/13/2022 CLINICAL DATA:  Rule out pneumonia, history of lung cancer EXAM: CHEST - 2 VIEW COMPARISON:  11/21/2022 FINDINGS: The heart size and mediastinal contours are within normal limits. Unchanged post treatment appearance of the right midlung. Diffuse bilateral interstitial pulmonary opacity, increased compared to prior examination. The visualized skeletal structures are unremarkable. IMPRESSION: 1. Diffuse bilateral interstitial pulmonary opacity, increased compared to prior examination, consistent with edema or infection. No focal airspace opacity. 2. Unchanged post treatment appearance of the right midlung. Electronically Signed   By: Delanna Ahmadi M.D.   On: 12/13/2022 13:21     Past medical hx Past Medical History:  Diagnosis Date   Anxiety    Arthritis    fingers, left foot   C. difficile diarrhea 2015   COPD (chronic obstructive pulmonary disease) (HCC)    Depression    Eczema    H pylori ulcer    Headache(784.0)    Hypercholesterolemia     Hypertension    Kidney stones    20 years ago   Pneumonia    5 years ago   Seizures ([place])    2 years ago, "cluster of seizures" none since   Shingles      Social History   Tobacco Use   Smoking status: Former    Packs/day: 0.50    Years: 30.00    Total pack years: 15.00    Types: Cigarettes    Quit date: 10/09/2021    Years since quitting: 1.1   Smokeless tobacco: Never   Tobacco comments:    5-10 cigarettes a day (plans to quit prior to this surgery 05/06/15)  Vaping Use   Vaping Use: Some days  Substance Use Topics   Alcohol use: Yes    Alcohol/week: 21.0 standard drinks of alcohol    Types: 21 Shots of liquor per week    Comment: 3 per day   Drug use: No    Ms.Tenaglia reports that she quit smoking about 14 months ago. Her smoking use included cigarettes. She has a 15.00 pack-year smoking history. She has never used smokeless tobacco. She reports current alcohol use of about 21.0 standard drinks of alcohol per week. She reports that she does not use drugs.  Tobacco Cessation: Ms.Mars reports that she quit smoking about a year ago. Her smoking use included cigarettes. She has a 15.00 pack-year smoking history.  She has never used smokeless tobacco. She reports current alcohol use of about 21.0 standard drinks of alcohol per week. She reports that she does not use drugs   Past surgical hx, Family hx, Social hx all reviewed.  Current Outpatient Medications on File Prior to Visit  Medication Sig   ALPRAZolam (XANAX) 0.5 MG tablet Take 0.5 mg by mouth daily as needed.   cetirizine (ZYRTEC) 10 MG tablet Take 10 mg by mouth at bedtime.   doxycycline (VIBRA-TABS) 100 MG tablet Take 1 tablet (100 mg total) by mouth 2 (two) times daily.   EPINEPHrine (AUVI-Q) 0.3 mg/0.3 mL IJ SOAJ injection Inject 0.3 mLs (0.3 mg total) into the muscle as needed for anaphylaxis.   fluticasone (FLONASE) 50 MCG/ACT nasal spray Place 2 sprays into both nostrils daily. (Patient taking  differently: Place 2 sprays into both nostrils daily as needed for allergies.)   hydrochlorothiazide (MICROZIDE) 12.5 MG capsule Take 12.5 mg by mouth daily.   hydrOXYzine (ATARAX) 25 MG tablet Take 25 mg by mouth daily.   omeprazole (PRILOSEC) 20 MG capsule Take 1 capsule (20 mg total) by mouth 2 (two) times daily. (Needs to be seen before next refill)   OVER THE COUNTER MEDICATION Take 1 capsule by mouth daily. Probiotic   Pirfenidone (ESBRIET) 267 MG TABS Month 1: Take 1 tab three times daily for 7 days, then 2 tabs three times daily for 7 days, then 3 tabs three times daily thereafter. (Account: [email or web address])   Pirfenidone 801 MG TABS Month 2 and onwards: take 1 tablet ('801mg'$ ) by mouth three times daily with meals. (Account: [email or web address])   rosuvastatin (CRESTOR) 10 MG tablet Take 10 mg by mouth at bedtime.   umeclidinium-vilanterol (ANORO ELLIPTA) 62.5-25 MCG/ACT AEPB Inhale 1 puff into the lungs daily.   No current facility-administered medications on file prior to visit.     Allergies  Allergen Reactions   Alpha-Gal     Other reaction(s): Abdominal Pain   Other Other (See Comments)   Penicillins     Unknown type of reaction   Prozac [Fluoxetine] Rash    Review Of Systems:  Constitutional:   No  weight loss, night sweats,  Fevers, chills, fatigue, or  lassitude.  HEENT:   + headaches,  Difficulty swallowing,  Tooth/dental problems, or  Sore throat,                No sneezing, itching, ear ache, nasal congestion, post nasal drip,   CV:  No chest pain,  Orthopnea, PND, swelling in lower extremities, anasarca, dizziness, palpitations, syncope.   GI  No heartburn, indigestion, abdominal pain, nausea, vomiting, diarrhea, change in bowel habits, loss of appetite, bloody stools.   Resp: No shortness of breath with exertion or at rest.  No excess mucus, no productive cough,  No non-productive cough,  No coughing up of blood.  No change in color of  mucus.  No wheezing.  No chest wall deformity  Skin: no rash or lesions.  GU: no dysuria, change in color of urine, no urgency or frequency.  No flank pain, no hematuria   MS:  No joint pain or swelling.  No decreased range of motion.  No back pain.  Psych:  No change in mood or affect. No depression or anxiety.  No memory loss.   Vital Signs BP 108/60 (BP Location: Right Arm, Patient Position: Sitting, Cuff Size: Normal)   [name] 87   Temp 97.8 F (36.6 C) (Oral)   Ht '5\' 5"'$  (1.651  m)   Wt 148 lb 3.2 oz (67.2 kg)   SpO2 93%   BMI 24.66 kg/m    Physical Exam:  General- No distress,  A&Ox3 ENT: No sinus tenderness, TM clear, pale nasal mucosa, no oral exudate,no post nasal drip, no LAN Cardiac: S1, S2, regular rate and rhythm, no murmur Chest: No wheeze/ rales/ dullness; no accessory muscle use, no nasal flaring, no sternal retractions Abd.: Soft Non-tender Ext: No clubbing cyanosis, edema Neuro:  normal strength Skin: No rashes, warm and dry Psych: normal mood and behavior   Assessment/Plan  Slow to resolve Multifocal Pneumonia in setting of recent chemo/radiation therapy/ lung cancer Treated with Levaquin 11/02/2022 and prednisone taper Treated again 12/13/2022 with Doxycycline and repeat prednisone taper.  Much better 3/6, but continued cough Plan I will send in an additional 7 days of prednisone in case you need it for shortness of breath or wheezing. I will send in HydroMet cough syrup. Take 5 mLs by mouth every 6 (six) hours as needed for cough  Do not drive if sleepy Please call to be seen as soon as you start feeling short of breath, so we can treat you as early as possible Call for any worsening in breathing and increased oxygen demand.    Hemoptysis>> New today streaks of blood in sputum Suspect cough related Plan I will send in Hycodan  cough syrup. Take 5 mLs by mouth every 6 (six) hours as needed for cough  Do not drive if sleepy Call me tomorrow if  this has not improved  Acute on Chronic Respiratory Failure Moderately reduced DLCO,  Mildly reduced lung volumes Plan Wear oxygen at 3-4 L [place] Saturations should always be > 88% Call for increased oxygen demand  Former smoker Plan Remains smoke free   OSA on CPAP Plan Awaiting CPAP machine from [employer] Auto Set 5-15 cm H2O I will place an order for 2 L [place] as bleed in during CPAP use.  Wear CPAP at bedtime.  Goal is to wear for at least 6 hours each night for maximal clinical benefit. Continue to work on weight loss, as the link between excess weight  and sleep apnea is well established.   Remember to establish a good bedtime routine, and work on sleep hygiene.  Limit daytime naps , avoid stimulants such as caffeine and nicotine close to bedtime, exercise daily to promote sleep quality, avoid heavy , spicy, fried , or rich foods before bed. Ensure adequate exposure to natural light during the day,establish a relaxing bedtime routine with a pleasant sleep environment ( Bedroom between 60 and 67 degrees, turn off bright lights , TV or device screens screens , consider black out curtains or white noise machines) Do not drive if sleepy. Remember to clean mask, tubing, filter, and reservoir once weekly with soapy water.  Follow up with Judson Roch NP  In  1 month after getting CPAP device for down load    ILD followed by Dr. Chase [name] Plan Follow up with Dr. Chase [name] 12/20/2022 after PFT's on same date Continue pirfenidone as you have been doing Take 1 tab three times daily for 7 days, then 2 tabs three times daily for 7 days, then 3 tabs three times daily thereafter  Call , or reduce dose if you have worsening stomach upset.  Stage IIB (T2a, N1, M0) non-small cell lung cancer, squamous cell carcinoma presented with right lower lobe lung mass in addition to right hilar lymphadenopathy diagnosed in March 2023.   Post concurrent  chemo radiation last dosing 02/27/2022. Plan Continued surveillance  per medical oncology Dr. Earlie Server 3 month CT Chest scheduled 01/25/2023    I spent 50 minutes dedicated to the care of this patient on the date of this encounter to include pre-visit review of records, face-to-face time with the patient discussing conditions above, post visit ordering of testing, clinical documentation with the electronic health record, making appropriate referrals as documented, and communicating necessary information to the patient's healthcare team.    Magdalen Spatz, NP 12/20/2022  1:46 PM

## 2022-12-21 ENCOUNTER — Other Ambulatory Visit: Payer: Self-pay | Admitting: *Deleted

## 2022-12-21 DIAGNOSIS — J849 Interstitial pulmonary disease, unspecified: Secondary | ICD-10-CM

## 2022-12-21 DIAGNOSIS — G4733 Obstructive sleep apnea (adult) (pediatric): Secondary | ICD-10-CM

## 2022-12-21 NOTE — Telephone Encounter (Signed)
Hey girl,  Can you tell me why you placed an order for a 10L concentrator for this patient  [employer] is needing the oxygen order template used and her stats updated  Thanks

## 2022-12-22 NOTE — Telephone Encounter (Signed)
Diane [name]; [name], [place]; [place], [place]; [name] On this order here they are asking for O2 bleed into pap But they are also asking for a 10Liter concentrator. We will need qualifying testing in order to get a larger concentrator. They will do the order for the Adapter and then another one that they will hold until documents are received.

## 2022-12-25 NOTE — Telephone Encounter (Signed)
Pt is scheduled for PFT and OV with Dr. Chase Caller tomorrow 12/26/22. Qualifying walk can be done and documented in templet at that time.   Routing message to Dr. Birdena Jubilee as FYI for OV on 12/26/22

## 2022-12-26 ENCOUNTER — Ambulatory Visit: Payer: Medicare Other | Admitting: Internal Medicine

## 2022-12-26 ENCOUNTER — Encounter: Payer: Self-pay | Admitting: Internal Medicine

## 2022-12-26 ENCOUNTER — Ambulatory Visit (INDEPENDENT_AMBULATORY_CARE_PROVIDER_SITE_OTHER): Payer: Medicare Other | Admitting: Internal Medicine

## 2022-12-26 VITALS — BP 110/60 | HR 84 | Temp 98.0°F | Ht 65.0 in | Wt 148.4 lb

## 2022-12-26 DIAGNOSIS — Z5181 Encounter for therapeutic drug level monitoring: Secondary | ICD-10-CM | POA: Diagnosis not present

## 2022-12-26 DIAGNOSIS — G4733 Obstructive sleep apnea (adult) (pediatric): Secondary | ICD-10-CM

## 2022-12-26 DIAGNOSIS — J439 Emphysema, unspecified: Secondary | ICD-10-CM

## 2022-12-26 DIAGNOSIS — J84112 Idiopathic pulmonary fibrosis: Secondary | ICD-10-CM

## 2022-12-26 DIAGNOSIS — R0600 Dyspnea, unspecified: Secondary | ICD-10-CM

## 2022-12-26 LAB — PULMONARY FUNCTION TEST
DL/VA % pred: 55 %
DL/VA: 2.3 ml/min/mmHg/L
DLCO cor % pred: 34 %
DLCO cor: 6.99 ml/min/mmHg
DLCO unc % pred: 34 %
DLCO unc: 6.99 ml/min/mmHg
FEF 25-75 Post: 1.56 L/sec
FEF 25-75 Pre: 1.82 L/sec
FEF2575-%Change-Post: -14 %
FEF2575-%Pred-Post: 71 %
FEF2575-%Pred-Pre: 83 %
FEV1-%Change-Post: -2 %
FEV1-%Pred-Post: 67 %
FEV1-%Pred-Pre: 69 %
FEV1-Post: 1.7 L
FEV1-Pre: 1.74 L
FEV1FVC-%Change-Post: -2 %
FEV1FVC-%Pred-Pre: 107 %
FEV6-%Change-Post: 0 %
FEV6-%Pred-Post: 66 %
FEV6-%Pred-Pre: 66 %
FEV6-Post: 2.1 L
FEV6-Pre: 2.1 L
FEV6FVC-%Change-Post: 0 %
FEV6FVC-%Pred-Post: 103 %
FEV6FVC-%Pred-Pre: 104 %
FVC-%Change-Post: 0 %
FVC-%Pred-Post: 64 %
FVC-%Pred-Pre: 64 %
FVC-Post: 2.11 L
FVC-Pre: 2.1 L
Post FEV1/FVC ratio: 81 %
Post FEV6/FVC ratio: 100 %
Pre FEV1/FVC ratio: 83 %
Pre FEV6/FVC Ratio: 100 %
RV % pred: 74 %
RV: 1.6 L
TLC % pred: 70 %
TLC: 3.68 L

## 2022-12-26 NOTE — Patient Instructions (Addendum)
ICD-10-CM   1. UIP (usual interstitial pneumonitis) ([place])  J84.112     2. IPF (idiopathic pulmonary fibrosis) ([place])  J84.112     3. Pulmonary emphysema, unspecified emphysema type (HCC)  J43.9     4. OSA (obstructive sleep apnea)  G47.33     5. Encounter for therapeutic drug monitoring  Z51.81      Glad you are much improved after Diane [name] treated with a second round of antibiotic and prednisone  Glad you escalation to 3 pills 3 times daily of pirfenidone yesterday 12/25/2022 so far gone well  Your fatigue is multifactorial  Glad you are in touch with Diane [name] the support group chair  Plan 0 - -Refer to pulmonary rehabilitation in [place] or [place] -Completed current prednisone course -Continue Anoro -Continue oxygen for pulse ox goal greater than 88%  - need 8-10L [place] to do simple walks > 100 fet  - need home o2 concentator 10L -Continue pirfenidone -Check liver function test today 12/26/2022 and see me to do a standing order to get it repeated once a month for the next 4 months - -Spirometry and DLCO in 12 weeks  Followup  -Video visit with nurse practitioner Diane [name] face-to-face visit nurse practitioner in 6 weeks to monitor pirfenidone uptake -12 weeks Dr. Chase [name] 30-minute visit but after PFT

## 2022-12-26 NOTE — Progress Notes (Signed)
Full PFT performed today. °

## 2022-12-26 NOTE — Patient Instructions (Signed)
Full PFT performed today. °

## 2022-12-26 NOTE — Progress Notes (Signed)
OV 11/13/2022 transfer of care to the ILD center with Dr. Chase Caller.  Subjective:  Patient ID: Diane [name], female , DOB: September 30, 1956 , age 67 y.o. , MRN: [id number] , ADDRESS: [place] [id number] PCP Glenda Chroman, MD Patient Care Team: Glenda Chroman, MD as PCP - General (Internal Medicine) Gala Romney Cristopher Estimable, MD as Consulting Physician (Gastroenterology) Lavonna Monarch, MD (Inactive) as Consulting Physician (Dermatology)  This Provider for this visit: Treatment Team:  Attending Provider: Brand Males, MD    11/13/2022 -   Chief Complaint  Patient presents with   New Patient (Initial Visit)    New pt from [facility] for ILD. Patient filled out packet and for Esbriet but never started the medication. CT is for April 2024. Pt is just getting over PNA. Still has a slight cough. Just finished prednisone and Levaquin.      HPI Diane [name] 67 y.o. -presents with her husband Diane [name].  Diane [name] daughter-in-law [name] with respiratory therapist at [facility].  She retire and then in the spring 2023 was diagnosed with right lower lobe non-small cell cancer stage II.  She is status post radiation and also chemotherapy.  Currently on observation treatment.  This is based on independent history from the husband and the patient and also review of the external records.  She is understandably quite frustrated with the onset of all these health issues given the fact she just retired.  Then approximately in September 2023 started noticing significant amount of dyspnea on exertion walking the dog and climbing stairs.  At this time desaturations with exercise to be noticed.  She has since been using oxygen and this helps her.  She is attended cardiac rehabilitation or pulmonary rehabilitation and this also helped her.  She was then given a diagnosis of pulmonary fibrosis/UIP.  Antifibrotic pirfenidone was started based on her history and also external  record review.  However due to some confusion of the paperwork this has not been started yet.  She is here for evaluation of her ILD.  Visualization of CT scan from 2015 shows presence of possible early ILD even back in 2015 with some honeycombing/emphysema at the base.  There was a craniocaudal gradient back pain.   Also of note she just went to a [place] cruise in mid January 2024.  When she came back on October 28, 2022.  Then on October 30, 2022 she had cough headaches no fever.  Her flu test and COVID was negative.  She is being treated for acute bronchitis with Levaquin and IM steroids and 6-day prednisone taper.  She will she was given a diagnosis of pneumonia actually.  She is almost back to baseline but still feels fatigued and still with some cough worse than her baseline. [facility] Comprehensive ILD Questionnaire  Symptoms:  0 she believes she has had much rapid worsening in the last 6 months only.  Suspect this is after her radiation.  Current symptom scores are as below.   Past Medical History :  -She was told 20 years ago she had hiatal hernia and acid reflux but she says she has had no issues since then.  It is definitely not described in the current CT chest -She is awaiting a sleep study -She had seizures 12 years ago. -She has hypertension controlled on medication -Currently just finished antibiotic and steroid course-has had COVID-vaccine but never had COVID    ROS:  -Tired and does have dry  eyes -Has acid reflux for which she is taking omeprazole  FAMILY HISTORY of LUNG DISEASE:  *-Mother had asthma but there is no family history of lung disease  PERSONAL EXPOSURE HISTORY:   -She smoked between 1973 and 2022.  Smoked 1 pack/day and then quit.  In the 1980s and 1990s very briefly very rarely she did some cocaine.  She smoked some marijuana between 1976 and 2022 very minimal.  HOME  EXPOSURE and HOBBY DETAILS :  [place]   The home is 66 years old.  She is lived there for 19 years.  She recently got her down comforter 3 months ago but otherwise no organic antigen exposure in the house.  Although there is a leaky roof for the last 1 year in 2021 but there is no mold.  She is retired.  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  INVESTIGATIONS: -As below    HRCT dec 2023 -personally visualized and independently agree  COMPARISON:  Chest CT 06/27/2022.   FINDINGS: Cardiovascular: Heart size is normal. There is no significant pericardial fluid, thickening or pericardial calcification. There is aortic atherosclerosis, as well as atherosclerosis of the great vessels of the mediastinum and the coronary arteries, including calcified atherosclerotic plaque in the left main, left anterior descending and right coronary arteries.   Mediastinum/Nodes: No pathologically enlarged mediastinal or left hilar lymph nodes. Soft tissue fullness in the right hilar region concerning for underlying lymphadenopathy, but poorly evaluated on today's noncontrast CT examination (best appreciated on axial image 60 of series 2). Esophagus is unremarkable in appearance. No axillary lymphadenopathy.   Lungs/Pleura: Treated lesion in the right lower lobe is now partially obscured by surrounding parenchymal changes, but is estimated to measure approximately 2.1 x 1.4 cm (axial image 114 of series 4). There has been a dramatic increase in surrounding areas of ground-glass attenuation, septal thickening, bronchiectasis and cystic changes, and regional architectural distortion in the surrounding lung parenchyma, presumably reflective of evolving postradiation changes. Several nodular areas of  architectural distortion are now noted in the right lung, most notably a macrolobulated nodule with spiculated margins in the right upper lobe (axial image 86 of series 4) measuring 1.9 x 1.3 cm, and a slightly ill-defined 1.9 x 1.1 cm nodular area of architectural distortion in the right lower lobe at the base (axial image 185 of series 4). Small right pleural effusion predominantly lying dependently. No left pleural effusion. In the remaining portions of the lungs are widespread areas of septal thickening, subpleural reticulation, traction bronchiectasis, peripheral bronchiolectasis and honeycombing, most evident in the mid to lower lung distribution. Inspiratory and expiratory imaging is unremarkable. There is also a background of mild diffuse bronchial wall thickening with moderate centrilobular and paraseptal emphysema.   Upper Abdomen: Aortic atherosclerosis.  Status post cholecystectomy.   Musculoskeletal: There are no aggressive appearing lytic or blastic lesions noted in the visualized portions of the skeleton.  IMPRESSION: 1. The appearance of the lungs is compatible with Spoke with the pt's spouse and scheduled appt for 12/20/22 at 1:30 pm. rior study. 2. Evolving postradiation changes in the right lung. Treated nodule in the superior segment of the right lower lobe appears grossly similar to the prior study, while there are new  nodules in the right lung, most concerning of which is in the right upper lobe (axial image 86 of series 4), currently measuring 1.9 x 1.3 cm. Close attention on follow-up studies is recommended. These new areas of nodularity could be treatment related or infectious or inflammatory in etiology, however, neoplasm is not excluded. 3. Mild diffuse bronchial wall thickening with moderate centrilobular and paraseptal emphysema; imaging findings suggestive of underlying COPD. 4. Aortic atherosclerosis, in addition to left main and 2 vessel coronary  artery disease. Please note that although the presence of coronary artery calcium documents the presence of coronary artery disease, the severity of this disease and any potential stenosis cannot be assessed on this non-gated CT examination. Assessment for potential risk factor modification, dietary therapy or pharmacologic therapy may be warranted, if clinically indicated.   Aortic Atherosclerosis (ICD10-I70.0) and Emphysema (ICD10-J43.9).     Electronically Signed   By: Vinnie Langton M.D.   On: 09/27/2022 07:43   Telephone with Clarise Cruz 12/13/22 tient called the office 12/13/2022  to get her sleep study results.  While I was talking with the patient she was short of breath and shared that she had increased oxygen demands. She said this has been going on for several weeks since she returned from  a cruise she took with friends to celebrate finishing chemo and radiation . This was a [place] cruise in January 2024. She returned October 28, 2022. Then on October 30, 2022 she had cough headaches no fever. Her flu test and COVID was negative. She was treated for acute bronchitis and pneumonia  with Levaquin and IM steroids and 6-day prednisone taper.  She was feeling better but still fatigued and had a and still with some cough worse than her baseline  when she saw Dr. Chase Caller  11/13/2022.  I ordered a CXR at [facility] (Closer to patient ).I also ordered a home tank with a higher liter flow capacity as her oxygen demand was so much higher.. CXR Results 2/28 confirmed >>  Diffuse bilateral interstitial pulmonary opacity, increased compared to prior examination, consistent with edema or infection. No focal airspace opacity. Unchanged post treatment appearance of the right midlung.I called the patient with the results of her scan and sent in prescriptions for Doxycycline 100 mg BID x 7 days and a prednisone taper Prednisone taper; 10 mg tablets: 4 tabs x 2 days, 3 tabs x 2 days, 2 tabs x 2 days 1 tab x 2  days then stop. She has a penicillin allergy, and history of QT prolongation .  I  asked her to start Mucinex 1200 mg each morning with a glass of water and start using her flutter valve. She states her secretions are thick and hard to get up. I have asked her to use her albuterol nebs in the morning and evening while she is sick, and to wear her oxygen at bedtime at 2  liters. When I asked if she has had a fever, she said she did not think so, but when I asked if she had chills , she said she has had a lot of couch time covered in blankets, so endorses that she may have had fever. I asked [name] to check in with me Friday 12/15/2022 to ensure she was improving. We discussed that if she does not start turning around soon, we may need to consider IV antibiotics. We discussed if she gets worse , not better to call the office  or seek emergency care.  I reviewed her  last echo from 11/15/2022, which shows EF of 60-65%, LV normal function, Left ventricular diastolic parameters are consistent with Grade I diastolic  dysfunction. She denied any lower extremity edema. She does have a follow up CT Chest scheduled 01/25/2023 per oncology,I plan to  order one earlier if she does not improve.    12/20/2022 -office with Eric Form  Pt. Presents for short term follow up of multifocal pneumonia noted on CXR 12/13/2022. She was treated with Doxycycline and prednisone taper 12/13/2022 for her slow to resolve pneumonia . She states she is feeling much better. She states it is like night and day. She states she is back to her baseline. She is using 3 L [place] at rest and 4 L with exertion. Her oxygen demand is back to her baseline and she has her energy back. She does still have a cough, which is productive . She coughed in the office and she had some blood streaks in it. She states this is new, and she thinks it is from coughing. Secretions are mostly clear with some tan bits. She denies fever or chest pain. She has completed the  Doxycycline and has her last dose of prednisone tomorrow. I will give her an additional 7 days of 10 mg prednisone daily to cover her until she sees Dr. Chase Caller next  week.  She started perfididone Monday 12/18/2022. I have told her to let us know if she has any stomach upset.  She is much more compliant with her oxygen use. She had her oxygen on today in the office. She is monitoring her oxygen levels. She is compliant with her Anoro daily. She is using her albuterol as needed. She has been using her Mucinex with flutter valve for secretion mobilization.   She does have headaches, which I think are from her cough. I have prescribed her cough medication with Codeine to see if that resolves the headaches.   She is still waiting in her CPAP machine and her 10 L home oxygen concentrator. My nurse will check with [employer] about the delay.    OV 12/26/2022  Subjective:  Patient ID: Diane [name], female , DOB: Jul 18, 1956 , age 65 y.o. , MRN: [id number] , ADDRESS: [place] [address] PCP Glenda Chroman, MD Patient Care Team: Glenda Chroman, MD as PCP - General (Internal Medicine) Gala Romney Cristopher Estimable, MD as Consulting Physician (Gastroenterology) Lavonna Monarch, MD (Inactive) as Consulting Physician (Dermatology)  This Provider for this visit: Treatment Team:  Attending Provider: Brand Males, MD    12/26/2022 -   Chief Complaint  Patient presents with   Follow-up    PFT F/up   #IPF/UIP pattern #Associated pulmonary emphysema #Combined pulmonary fibrosis emphysema #Lung cancer  HPI Stefannie Simkin 67 y.o. -returns for follow-up.  She presents with her husband.  She tells me that after she saw me she got the prednisone taper and felt transiently better but then when she had a phone call with Barbaraann Barthel it was noticed that she was quite symptomatic.  She was then called in for an acute visit and given 7-10 days of daily prednisone along with an antibiotic.   This then has helped her come back to baseline.  She feels she is currently at baseline.  At the time it was noticed that she was desaturating easily and she needed significant amount of oxygen to correct.  Her home oxygen concentrator only go up to 5 L.  Therefore we did a oxygen titration test  today and she actually needs 8-10 L to correct and walk our standard 3 laps in the office.  Nevertheless she feels closer to baseline.  She still has a lot of fatigue and shortness of breath and she wants to improve on this.  She is open and willing to attend pulmonary rehabilitation.  Apparently referral has been made by me and by nurse practitioner but she still has not heard.  I did indicate to her we will make another referral again.  She also told me that she is now touch base with Marlane Mingle the support group chair and she is enjoying those conversations.  She had pulmonary function test that compared to a year ago shows significant decline at least 16% and the FVC decline in a year.  She just started Esbriet for this and as of yesterday has gone to 3 pills 3 times daily.  She is to have an echo and pulmonary artery pressures are reported as normal  Other than fatigue which she does not believe is due to pirfenidone she is actually tolerating the pirfenidone quite well.    SYMPTOM SCALE - ILD 11/13/2022  Current weight   O2 use Exertional walking the dog.  Shortness of Breath 0 -> 5 scale with 5 being worst (score 6 If unable to do)  At rest 0  Simple tasks - showers, clothes change, eating, shaving 3  Household (dishes, doing bed, laundry) 3  Shopping 2  Walking level at own pace 4  Walking up Stairs 4  Total (30-36) Dyspnea Score 16      Non-dyspnea symptoms (0-> 5 scale) 11/13/2022  How bad is your cough? 2 -cough is better after quitting smoking  How bad is your fatigue 2  How bad is nausea 0  How bad is vomiting?  0  How bad is diarrhea? 0  How bad is anxiety? 3  How bad is depression 1   Any chronic pain - if so where and how bad 0    Simple office walk 185 feet x  3 laps goal with forehead probe 12/26/2022  12/26/2022   O2 used RA   Number laps completed 1/2 lap of 1 ap   Comments about pace normal   Resting Pulse Ox/HR 94% and 90/min   Final Pulse Ox/HR 87% and 90/min   Desaturated </= 88% yes   Desaturated <= 3% points yes   Got Tachycardic >/= 90/min yes   Symptoms at end of test dyspnea   Miscellaneous comments Waked on 5L [place] - 96% sitting -> still deats Needed 8-10L to correct and walk all 3 laps     PFT     Latest Ref Rng & Units 12/26/2022    1:53 PM 01/04/2022    9:51 AM  PFT Results  FVC-Pre L 2.10  P 2.54   FVC-Predicted Pre % 64  P 76   FVC-Post L 2.11  P 2.69   FVC-Predicted Post % 64  P 81   Pre FEV1/FVC % % 83  P 73   Post FEV1/FCV % % 81  P 71   FEV1-Pre L 1.74  P 1.84   FEV1-Predicted Pre % 69  P 72   FEV1-Post L 1.70  P 1.91   DLCO uncorrected ml/min/mmHg 6.99  P 9.15   DLCO UNC% % 34  P 44   DLCO corrected ml/min/mmHg 6.99  P 9.15   DLCO COR %Predicted % 34  P 44   DLVA Predicted % 55  P 55   TLC L 3.68  P 3.94   TLC % Predicted % 70  P 75   RV % Predicted % 74  P 62     P Preliminary result    ECHO Jan 2024   IMPRESSIONS     1. Left ventricular ejection fraction, by estimation, is 60 to 65%. The  left ventricle has normal function. The left ventricle has no regional  wall motion abnormalities. Left ventricular diastolic parameters are  consistent with Grade I diastolic  dysfunction (impaired relaxation).   2. Right ventricular systolic function is normal. The right ventricular  size is normal. There is normal pulmonary artery systolic pressure. The  estimated right ventricular systolic pressure is [id number] mmHg.   3. The mitral valve is normal in structure. No evidence of mitral valve  regurgitation. No evidence of mitral stenosis.   4. The aortic valve is tricuspid. Aortic valve regurgitation is mild to  moderate. No  aortic stenosis is present.   5. The inferior vena cava is normal in size with greater than 50%  respiratory variability, suggesting right atrial pressure of 3 mmHg     has a past medical history of Anxiety, Arthritis, C. difficile diarrhea (2015), COPD (chronic obstructive pulmonary disease) (HCC), Depression, Eczema, H pylori ulcer, Headache(784.0), Hypercholesterolemia, Hypertension, Kidney stones, Pneumonia, Seizures ([place]), and Shingles.   reports that she quit smoking about 14 months ago. Her smoking use included cigarettes. She has a 15.00 pack-year smoking history. She has never used smokeless tobacco.  Past Surgical History:  Procedure Laterality Date   ABDOMINAL HYSTERECTOMY     ADENOIDECTOMY     APPENDECTOMY     BRONCHIAL BIOPSY  12/20/2021   Procedure: BRONCHIAL BIOPSIES;  Surgeon: Garner Nash, DO;  Location: [facility];  Service: Pulmonary;;   BRONCHIAL BRUSHINGS  12/20/2021   Procedure: BRONCHIAL BRUSHINGS;  Surgeon: Garner Nash, DO;  Location: [facility];  Service: Pulmonary;;   BRONCHIAL NEEDLE ASPIRATION BIOPSY  12/20/2021   Procedure: BRONCHIAL NEEDLE ASPIRATION BIOPSIES;  Surgeon: Garner Nash, DO;  Location: [facility];  Service: Pulmonary;;   CESAREAN SECTION     CHOLECYSTECTOMY     COLONOSCOPY  06/10/2012   RMR: Colonic polyps -removed as described above.    FINE NEEDLE ASPIRATION  12/20/2021   Procedure: FINE NEEDLE ASPIRATION (FNA) LINEAR;  Surgeon: Garner Nash, DO;  Location: [facility];  Service: Pulmonary;;   KNEE ARTHROSCOPY  1973   right knee,  torn cart   LUMBAR DISC SURGERY  05/12/2015   L4 L5   LUMBAR LAMINECTOMY/DECOMPRESSION MICRODISCECTOMY Left 05/12/2015   Procedure: Left L4-5 Microdiscectomy;  Surgeon: Marybelle Killings, MD;  Location: [facility];  Service: Orthopedics;  Laterality: Left;   TONSILLECTOMY     VIDEO BRONCHOSCOPY WITH ENDOBRONCHIAL ULTRASOUND Bilateral 12/20/2021   Procedure: VIDEO BRONCHOSCOPY WITH ENDOBRONCHIAL ULTRASOUND;   Surgeon: Garner Nash, DO;  Location: [facility];  Service: Pulmonary;  Laterality: Bilateral;   VIDEO BRONCHOSCOPY WITH RADIAL ENDOBRONCHIAL ULTRASOUND  12/20/2021   Procedure: RADIAL ENDOBRONCHIAL ULTRASOUND;  Surgeon: Garner Nash, DO;  Location: [facility];  Service: Pulmonary;;    Allergies  Allergen Reactions   Alpha-Gal     Other reaction(s): Abdominal Pain   Other Other (See Comments)   Penicillins     Unknown type of reaction   Prozac [Fluoxetine] Rash    Immunization History  Administered Date(s) Administered   Fluad Quad(high Dose 65+) 08/08/2022   Influenza, Quadrivalent, Recombinant, Inj, Pf 07/24/2019  Influenza,inj,Quad PF,6+ Mos 07/15/2016, 08/03/2017, 08/29/2018   Influenza-Unspecified 07/16/2021   Moderna Sars-Covid-2 Vaccination 12/25/2019, 01/24/2020   PNEUMOCOCCAL CONJUGATE-20 08/08/2022   Pneumococcal Polysaccharide-23 09/29/2013   RSV,unspecified 09/14/2022    Family History  Problem Relation Age of Onset   Arthritis Mother    Asthma Mother    Depression Mother    Hyperlipidemia Mother    Varicose Veins Mother    Arthritis Father    Heart disease Father    Hyperlipidemia Father    Vision loss Father    Cancer Sister    COPD Sister    Early death Sister    Cancer Brother    Alcohol abuse Maternal Grandfather    Colon cancer Neg Hx      Current Outpatient Medications:    ALPRAZolam (XANAX) 0.5 MG tablet, Take 0.5 mg by mouth daily as needed., Disp: , Rfl:    cetirizine (ZYRTEC) 10 MG tablet, Take 10 mg by mouth at bedtime., Disp: , Rfl:    doxycycline (VIBRA-TABS) 100 MG tablet, Take 1 tablet (100 mg total) by mouth 2 (two) times daily., Disp: 14 tablet, Rfl: 0   EPINEPHrine (AUVI-Q) 0.3 mg/0.3 mL IJ SOAJ injection, Inject 0.3 mLs (0.3 mg total) into the muscle as needed for anaphylaxis., Disp: 2 each, Rfl: 1   fluticasone (FLONASE) 50 MCG/ACT nasal spray, Place 2 sprays into both nostrils daily. (Patient taking differently: Place 2  sprays into both nostrils daily as needed for allergies.), Disp: 16 g, Rfl: 5   hydrochlorothiazide (MICROZIDE) 12.5 MG capsule, Take 12.5 mg by mouth daily., Disp: , Rfl:    HYDROcodone bit-homatropine (HYCODAN) 5-1.5 MG/5ML syrup, Take 5 mLs by mouth every 6 (six) hours as needed for cough., Disp: 240 mL, Rfl: 0   HYDROcodone bit-homatropine (HYCODAN) 5-1.5 MG/5ML syrup, Take 5 mLs by mouth every 6 (six) hours as needed for cough., Disp: 240 mL, Rfl: 0   hydrOXYzine (ATARAX) 25 MG tablet, Take 25 mg by mouth daily., Disp: , Rfl:    omeprazole (PRILOSEC) 20 MG capsule, Take 1 capsule (20 mg total) by mouth 2 (two) times daily. (Needs to be seen before next refill), Disp: 60 capsule, Rfl: 0   OVER THE COUNTER MEDICATION, Take 1 capsule by mouth daily. Probiotic, Disp: , Rfl:    Pirfenidone (ESBRIET) 267 MG TABS, Month 1: Take 1 tab three times daily for 7 days, then 2 tabs three times daily for 7 days, then 3 tabs three times daily thereafter. (Account: [email or web address]), Disp: 270 tablet, Rfl: 0   Pirfenidone 801 MG TABS, Month 2 and onwards: take 1 tablet ('801mg'$ ) by mouth three times daily with meals. (Account: [email or web address]), Disp: 90 tablet, Rfl: 2   predniSONE (DELTASONE) 10 MG tablet, Take 1 tablet (10 mg total) by mouth daily with breakfast for 7 days., Disp: 7 tablet, Rfl: 0   rosuvastatin (CRESTOR) 10 MG tablet, Take 10 mg by mouth at bedtime., Disp: , Rfl:    umeclidinium-vilanterol (ANORO ELLIPTA) 62.5-25 MCG/ACT AEPB, Inhale 1 puff into the lungs daily., Disp: 60 each, Rfl: 3      Objective:   Vitals:   12/26/22 1519  BP: 110/60  Pulse: 84  Temp: 98 F (36.7 C)  SpO2: 92%  Weight: 148 lb 6.4 oz (67.3 kg)  Height: '5\' 5"'$  (1.651 m)    Estimated body mass index is 24.7 kg/m as calculated from the following:   Height as of this encounter: '5\' 5"'$  (1.651 m).   Weight as of this encounter: 148  lb 6.4 oz (67.3 kg).  '@WEIGHTCHANGE'$ @  [employer]    12/26/22 1519  Weight: 148 lb 6.4 oz (67.3 kg)     Physical Exam    General: No distress. Looks wll Neuro: Alert and Oriented x 3. GCS 15. Speech normal Psych: Pleasant Resp:  Barrel Chest - no.  Wheeze - no, Crackles - mild, No overt respiratory distress CVS: Normal heart sounds. Murmurs - no Ext: Stigmata of Connective Tissue Disease - NO but has CLUBBING HEENT: Normal upper airway. PEERL +. No post nasal drip        Assessment:       ICD-10-CM   1. UIP (usual interstitial pneumonitis) ([place])  J84.112     2. IPF (idiopathic pulmonary fibrosis) (HCC)  J84.112 Hepatic function panel    Hepatic function panel    3. Pulmonary emphysema, unspecified emphysema type ([place])  J43.9 Hepatic function panel    Hepatic function panel    4. OSA (obstructive sleep apnea)  G47.33     5. Encounter for therapeutic drug monitoring  Z51.81 Hepatic function panel    Hepatic function panel         Plan:     Patient Instructions     ICD-10-CM   1. UIP (usual interstitial pneumonitis) ([place])  J84.112     2. IPF (idiopathic pulmonary fibrosis) ([place])  J84.112     3. Pulmonary emphysema, unspecified emphysema type ([facility])  J43.9     4. OSA (obstructive sleep apnea)  G47.33     5. Encounter for therapeutic drug monitoring  Z51.81      Glad you are much improved after Barbaraann Barthel treated with a second round of antibiotic and prednisone  Glad you escalation to 3 pills 3 times daily of pirfenidone yesterday 12/25/2022 so far gone well  Your fatigue is multifactorial  Glad you are in touch with Marlane Mingle the support group chair  Plan 0 - -Refer to pulmonary rehabilitation in [place] or [place] -Completed current prednisone course -Continue Anoro -Continue oxygen for pulse ox goal greater than 88%  - need 8-10L [place] to do simple walks > 100 fet  - need home o2 concentator 10L -Continue pirfenidone -Check liver function test today 12/26/2022 and see me to do a standing order to  get it repeated once a month for the next 4 months - -Spirometry and DLCO in 12 weeks  Followup  -Video visit with nurse practitioner Barbaraann Barthel face-to-face visit nurse practitioner in 6 weeks to monitor pirfenidone uptake -12 weeks Dr. Chase Caller 30-minute visit but after PFT  ( Level 05 visit: Estb 40-54 min   in  visit type: on-site physical face to visit  in total care time and counseling or/and coordination of care by this undersigned MD - Dr Brand Males. This includes one or more of the following on this same day 12/26/2022: pre-charting, chart review, note writing, documentation discussion of test results, diagnostic or treatment recommendations, prognosis, risks and benefits of management options, instructions, education, compliance or risk-factor reduction. It excludes time spent by the [place] or office staff in the care of the patient. Actual time 78 min)    SIGNATURE    Dr. Brand Males, M.D., F.C.C.P,  Pulmonary and Critical Care Medicine Staff Physician, [place] Director - Interstitial Lung Disease  Program  Pulmonary [place] at [place], [place], [address]  Pager: [phone], If no answer or between  15:00h - 7:00h: call [phone]  [place] Telephone: [phone]  5:57 PM 12/26/2022

## 2022-12-27 LAB — HEPATIC FUNCTION PANEL
ALT: 14 U/L (ref 0–35)
AST: 17 U/L (ref 0–37)
Albumin: 3.9 g/dL (ref 3.5–5.2)
Alkaline Phosphatase: 64 U/L (ref 39–117)
Bilirubin, Direct: 0.1 mg/dL (ref 0.0–0.3)
Total Bilirubin: 0.2 mg/dL (ref 0.2–1.2)
Total Protein: 7.9 g/dL (ref 6.0–8.3)

## 2022-12-27 NOTE — Telephone Encounter (Signed)
Carmin [place], [place]; [place], [place]; [name]; 1 other The sats that are in [facility] are still from 6 days ago and the notes are for a POC on 2LPM. I see a note in the MD progress note but that will not work. We need sat formed like in [facility].  Thanks!

## 2022-12-27 NOTE — Telephone Encounter (Signed)
Pt had an OV with Dr. Chase Caller yesterday 3/12. Please see walk from this visit. Alvin Critchley was working with Dr. Chase Caller 3/12 if she needs to update the [facility] note.

## 2022-12-27 NOTE — Telephone Encounter (Signed)
O2 sat from OV note completed on 12/26/22 inserted into templet in [facility] note and documented as such. Nothing further needed at this time.

## 2023-01-01 ENCOUNTER — Encounter: Payer: Self-pay | Admitting: Internal Medicine

## 2023-01-01 DIAGNOSIS — J849 Interstitial pulmonary disease, unspecified: Secondary | ICD-10-CM

## 2023-01-03 ENCOUNTER — Telehealth (HOSPITAL_COMMUNITY): Payer: Self-pay | Admitting: *Deleted

## 2023-01-03 NOTE — Telephone Encounter (Signed)
Received pulmonary rehab referral from Dr. Vidal Schwalbe.  Noted that pt resides in [place].  Called to ascertain preference of location. Spoke with pt who had previously participated in pulmonary rehab at [facility].  Would like to participate here at [facility] now that she is retired.  Reviewed days of week and class time.  Indicated she would like an afternoon time.  Will complete a clinical review and forward to support staff. Cherre Huger, BSN Cardiac and Training and development officer

## 2023-01-08 ENCOUNTER — Telehealth: Payer: Self-pay | Admitting: Internal Medicine

## 2023-01-08 NOTE — Telephone Encounter (Signed)
Called patient regarding upcoming April appointments, patient is notified. 

## 2023-01-10 ENCOUNTER — Telehealth (HOSPITAL_COMMUNITY): Payer: Self-pay

## 2023-01-10 ENCOUNTER — Encounter (HOSPITAL_COMMUNITY): Payer: Self-pay

## 2023-01-10 NOTE — Telephone Encounter (Signed)
Called patient to see if she was interested in participating in the Pulmonary Rehab Program. Patient stated yes. Patient will come in for orientation on 01/12/23@1pm  and will attend the 1:15pm exercise class.   Sent packet via [employer]

## 2023-01-10 NOTE — Telephone Encounter (Signed)
Pt insurance is active and benefits verified through Medicare a/b Co-pay 0, DED $240/$240 met, out of pocket 0/0 met, co-insurance 20%. no pre-authorization required.   2ndary insurance is active and benefits verified through [employer]. Co-pay 0, DED 0/0 met, out of pocket 0/0 met, co-insurance 0%. No pre-authorization required. 

## 2023-01-11 ENCOUNTER — Telehealth (HOSPITAL_COMMUNITY): Payer: Self-pay

## 2023-01-11 NOTE — Telephone Encounter (Signed)
Called to confirm appt. Pt confirmed appt. Instructed pt on proper footwear. Gave directions along with department number.   

## 2023-01-12 ENCOUNTER — Encounter (HOSPITAL_COMMUNITY)
Admission: RE | Admit: 2023-01-12 | Discharge: 2023-01-12 | Disposition: A | Discharge status: Home / Self Care | Payer: Medicare Other | Source: Ambulatory Visit | Attending: Internal Medicine | Admitting: Internal Medicine

## 2023-01-12 ENCOUNTER — Encounter (HOSPITAL_COMMUNITY): Payer: Self-pay

## 2023-01-12 VITALS — BP 104/64 | HR 98 | Resp 20 | Ht 66.0 in | Wt 145.3 lb

## 2023-01-12 DIAGNOSIS — J849 Interstitial pulmonary disease, unspecified: Secondary | ICD-10-CM | POA: Diagnosis not present

## 2023-01-12 NOTE — Progress Notes (Signed)
Diane [name] 67 y.o. female  Pulmonary Rehab Orientation Note  This patient was referred to Pulmonary Rehab by Dr. Chase Caller with the diagnosis of ILD arrived today in Cardiac and Pulmonary Rehab. She  arrived ambulatory with normal gait with her husband. She does carry portable oxygen. [employer] is the provider for their DME. Per patient, Diane [name] uses oxygen continuously.   Color good, skin warm and dry. Patient is oriented to time and place. Patient's medical history, psychosocial health, and medications reviewed. Psychosocial assessment reveals patient lives with spouse. Diane [name] is currently retired. Patient hobbies include watching tv, spending time with others, and cooking . Patient reports her stress level is moderate. Areas of stress/anxiety includes the deterioration of her health and not being able to do what she previously did . Patient does exhibit signs of depression. Signs of depression include anxiety, hopelessness, and panic and fatigue. PHQ2/9 score 4/11. Diane [name] shows good coping skills with a positive outlook on life. Offered emotional support and reassurance. Diane [name] declines referrals at this time to see a mental health expert. We will continue to monitor and evaluate progress toward psychosocial goal(s) of decreased stress and anxiety.   Physical assessment reveals patient is alert and oriented x 4. Heart rate is normal, breath sounds diminished with rhonchi to auscultation, no wheezes, rales. Reports productive cough with yellow/green sputum. Bowel sounds present x4 quads. Pt endorses abdominal discomfort, nausea, vomiting or diarrhea since starting pirfenidone. Grip strength equal, strong. Distal pulses +2; no swelling to lower extremities. Diane [name] reports she does take medications as prescribed. Patient states she follows a regular diet. The patient reports no specific efforts to gain or lose weight. Pt's weight will be monitored closely.   Demonstration and practice of PLB  using a pulse oximeter. Diane [name] is able to return demonstration satisfactorily. Safety and hand hygiene in the exercise area reviewed with patient. Diane [name] voices understanding of the information reviewed. Department expectations discussed with patient and achievable goals were set. The patient shows enthusiasm about attending the program and we look forward to working with Diane [name]. Diane [name] completed a 6 min walk test today and is scheduled to begin exercise on 4/4 at 1:15pm.   1245-1400 Diane Ores, RN, BSN

## 2023-01-12 NOTE — Progress Notes (Signed)
Pulmonary Individual Treatment Plan  Patient Details  Name: Diane [name] MRN: [id number] Date of Birth: [birth year] Referring Provider:   April Manson Pulmonary Rehab Walk Test from 01/12/2023 in [facility] for Heart, V[facility], & [place]  Referring Provider Ramaswamy       Initial Encounter Date:  Flowsheet Row Pulmonary Rehab Walk Test from 01/12/2023 in [facility] for Heart, Vascular, & [place]  Date 01/12/23       Visit Diagnosis: ILD (interstitial lung disease) ([place])  Patient's Home Medications on Admission:   Current Outpatient Medications:    albuterol (PROVENTIL) (5 MG/ML) 0.5% nebulizer solution, Take 2.5 mg by nebulization every 6 (six) hours as needed for wheezing or shortness of breath., Disp: , Rfl:    cetirizine (ZYRTEC) 10 MG tablet, Take 10 mg by mouth at bedtime., Disp: , Rfl:    EPINEPHrine (AUVI-Q) 0.3 mg/0.3 mL IJ SOAJ injection, Inject 0.3 mLs (0.3 mg total) into the muscle as needed for anaphylaxis., Disp: 2 each, Rfl: 1   fluticasone (FLONASE) 50 MCG/ACT nasal spray, Place 2 sprays into both nostrils daily. (Patient taking differently: Place 2 sprays into both nostrils daily as needed for allergies.), Disp: 16 g, Rfl: 5   hydrochlorothiazide (MICROZIDE) 12.5 MG capsule, Take 12.5 mg by mouth daily., Disp: , Rfl:    omeprazole (PRILOSEC) 20 MG capsule, Take 1 capsule (20 mg total) by mouth 2 (two) times daily. (Needs to be seen before next refill), Disp: 60 capsule, Rfl: 0   OVER THE COUNTER MEDICATION, Take 1 capsule by mouth daily. Probiotic, Disp: , Rfl:    Pirfenidone (ESBRIET) 267 MG TABS, Month 1: Take 1 tab three times daily for 7 days, then 2 tabs three times daily for 7 days, then 3 tabs three times daily thereafter. (Account: [email or web address]), Disp: 270 tablet, Rfl: 0   Pirfenidone 801 MG TABS, Month 2 and onwards: take 1 tablet (801mg ) by mouth three times daily with meals.  (Account: [email or web address]), Disp: 90 tablet, Rfl: 2   rosuvastatin (CRESTOR) 10 MG tablet, Take 10 mg by mouth at bedtime., Disp: , Rfl:    umeclidinium-vilanterol (ANORO ELLIPTA) 62.5-25 MCG/ACT AEPB, Inhale 1 puff into the lungs daily., Disp: 60 each, Rfl: 3   ALPRAZolam (XANAX) 0.5 MG tablet, Take 0.5 mg by mouth daily as needed. (Patient not taking: Reported on 01/12/2023), Disp: , Rfl:    doxycycline (VIBRA-TABS) 100 MG tablet, Take 1 tablet (100 mg total) by mouth 2 (two) times daily. (Patient not taking: Reported on 01/12/2023), Disp: 14 tablet, Rfl: 0   HYDROcodone bit-homatropine (HYCODAN) 5-1.5 MG/5ML syrup, Take 5 mLs by mouth every 6 (six) hours as needed for cough. (Patient not taking: Reported on 01/12/2023), Disp: 240 mL, Rfl: 0   HYDROcodone bit-homatropine (HYCODAN) 5-1.5 MG/5ML syrup, Take 5 mLs by mouth every 6 (six) hours as needed for cough. (Patient not taking: Reported on 01/12/2023), Disp: 240 mL, Rfl: 0   hydrOXYzine (ATARAX) 25 MG tablet, Take 25 mg by mouth daily. (Patient not taking: Reported on 01/12/2023), Disp: , Rfl:   Past Medical History: Past Medical History:  Diagnosis Date   Anxiety    Arthritis    fingers, left foot   C. difficile diarrhea 2015   COPD (chronic obstructive pulmonary disease) (HCC)    Depression    Eczema    H pylori ulcer    Headache(784.0)    Hypercholesterolemia    Hypertension    Kidney stones    20  years ago   Pneumonia    5 years ago   Seizures ([place])    2 years ago, "cluster of seizures" none since   Shingles     Tobacco Use: Social History   Tobacco Use  Smoking Status Former   Packs/day: 0.50   Years: 30.00   Additional pack years: 0.00   Total pack years: 15.00   Types: Cigarettes   Quit date: 10/09/2021   Years since quitting: 1.2  Smokeless Tobacco Never  Tobacco Comments   5-10 cigarettes a day (plans to quit prior to this surgery 05/06/15)    Labs: Review Flowsheet  More data exists       Latest Ref Rng & Units 01/11/2018 12/03/2019 01/06/2021 02/17/2021 10/14/2021  Labs for ITP Cardiac and Pulmonary Rehab  Cholestrol <200 mg/dL 221  273  232  258  173   LDL (calc) mg/dL (calc) 125  164  152  169  95   HDL-C > OR = 50 mg/dL 76  62  55  63  55   Trlycerides <150 mg/dL 100  255  123  129  129   Hemoglobin A1c <7.0 % 5.1  - - - -    Capillary Blood Glucose: Lab Results  Component Value Date   GLUCAP 96 10/12/2022   GLUCAP 100 (H) 12/13/2021     Pulmonary Assessment Scores:  Pulmonary Assessment Scores     Row Name 01/12/23 1436         ADL UCSD   SOB Score total 84       CAT Score   CAT Score 30       mMRC Score   mMRC Score 3             UCSD: Self-administered rating of dyspnea associated with activities of daily living (ADLs) 6-point scale (0 = "not at all" to 5 = "maximal or unable to do because of breathlessness")  Scoring Scores range from 0 to 120.  Minimally important difference is 5 units  CAT: CAT can identify the health impairment of COPD patients and is better correlated with disease progression.  CAT has a scoring range of zero to 40. The CAT score is classified into four groups of low (less than 10), medium (10 - 20), high (21-30) and very high (31-40) based on the impact level of disease on health status. A CAT score over 10 suggests significant symptoms.  A worsening CAT score could be explained by an exacerbation, poor medication adherence, poor inhaler technique, or progression of COPD or comorbid conditions.  CAT MCID is 2 points  mMRC: mMRC (Modified Medical Research Council) Dyspnea Scale is used to assess the degree of baseline functional disability in patients of respiratory disease due to dyspnea. No minimal important difference is established. A decrease in score of 1 point or greater is considered a positive change.   Pulmonary Function Assessment:  Pulmonary Function Assessment - 01/12/23 1327       Breath   Bilateral Breath  Sounds Decreased;Rhonchi    Shortness of Breath Yes;Fear of Shortness of Breath;Limiting activity             Exercise Target Goals: Exercise Program Goal: Individual exercise prescription set using results from initial 6 min walk test and THRR while considering  patient's activity barriers and safety.   Exercise Prescription Goal: Initial exercise prescription builds to 30-45 minutes a day of aerobic activity, 2-3 days per week.  Home exercise guidelines will be given to  patient during program as part of exercise prescription that the participant will acknowledge.  Activity Barriers & Risk Stratification:  Activity Barriers & Cardiac Risk Stratification - 01/12/23 1433       Activity Barriers & Cardiac Risk Stratification   Activity Barriers Deconditioning;Muscular Weakness;Shortness of Breath;Arthritis             6 Minute Walk:  6 Minute Walk     Row Name 01/12/23 1451         6 Minute Walk   Phase Initial     Distance 758 feet     Walk Time 6 minutes     # of Rest Breaks 2     MPH 1.44     METS 2.56     RPE 11     Perceived Dyspnea  3     VO2 Peak 8.97     Symptoms Yes (comment)     Comments dyspnea, stopped patient for O2 sat <88% at 2:09, 2:41, and 5:25     Resting HR 93 bpm     Resting BP 104/64     Resting Oxygen Saturation  99 %     Exercise Oxygen Saturation  during 6 min walk 6 %     Max Ex. HR 119 bpm     Max Ex. BP 124/70     2 Minute Post BP 112/66       Interval HR   1 Minute HR 109     2 Minute HR 116     3 Minute HR 112     4 Minute HR 112     5 Minute HR 117     6 Minute HR 119     2 Minute Post HR 103     Interval Heart Rate? Yes       Interval Oxygen   Interval Oxygen? Yes     Baseline Oxygen Saturation % 99 %     1 Minute Oxygen Saturation % 92 %     1 Minute Liters of Oxygen 6 L     2 Minute Oxygen Saturation % 88 %  at 2:09 O2 82%, increased to 8L at 2:41 O2 84%, increased to 10L     2 Minute Liters of Oxygen 6 L  at 2:09  O2 82%, increased to 8L     3 Minute Oxygen Saturation % 90 %     3 Minute Liters of Oxygen 10 L     4 Minute Oxygen Saturation % 94 %     4 Minute Liters of Oxygen 10 L     5 Minute Oxygen Saturation % 92 %  5:45 O2 86%, PLB performed, O2 88%     5 Minute Liters of Oxygen 10 L  5:45 O2 86%, PLB performed, O2 88%     6 Minute Oxygen Saturation % 88 %     6 Minute Liters of Oxygen 10 L     2 Minute Post Oxygen Saturation % 99 %  4 min post O2 97%, HR 101, on 4L     2 Minute Post Liters of Oxygen 6 L  4 min post O2 97%, HR 101, on 4L              Oxygen Initial Assessment:  Oxygen Initial Assessment - 01/12/23 1325       Home Oxygen   Home Oxygen Device Portable Concentrator;Home Concentrator;E-Tanks    Sleep Oxygen Prescription Continuous;CPAP    Home Exercise Oxygen Prescription  Continuous    Liters per minute 8    Home Resting Oxygen Prescription Continuous    Liters per minute 5    Compliance with Home Oxygen Use Yes             Oxygen Re-Evaluation:  Oxygen Re-Evaluation     Row Name 01/12/23 1325             Home Oxygen   Liters per minute 2         Goals/Expected Outcomes   Short Term Goals To learn and exhibit compliance with exercise, home and travel O2 prescription;To learn and understand importance of maintaining oxygen saturations>88%;To learn and demonstrate proper use of respiratory medications;To learn and understand importance of monitoring SPO2 with pulse oximeter and demonstrate accurate use of the pulse oximeter.;To learn and demonstrate proper pursed lip breathing techniques or other breathing techniques.        Long  Term Goals Exhibits compliance with exercise, home  and travel O2 prescription;Maintenance of O2 saturations>88%;Compliance with respiratory medication;Verbalizes importance of monitoring SPO2 with pulse oximeter and return demonstration;Exhibits proper breathing techniques, such as pursed lip breathing or other method taught during  program session;Demonstrates proper use of MDI's       Goals/Expected Outcomes For Auren to be compliant with her oxygen and respiratory medications, know how to use a pulse oximeter, and know what to do if her oxygen <88%.                Oxygen Discharge (Final Oxygen Re-Evaluation):  Oxygen Re-Evaluation - 01/12/23 1325       Home Oxygen   Liters per minute 2      Goals/Expected Outcomes   Short Term Goals To learn and exhibit compliance with exercise, home and travel O2 prescription;To learn and understand importance of maintaining oxygen saturations>88%;To learn and demonstrate proper use of respiratory medications;To learn and understand importance of monitoring SPO2 with pulse oximeter and demonstrate accurate use of the pulse oximeter.;To learn and demonstrate proper pursed lip breathing techniques or other breathing techniques.     Long  Term Goals Exhibits compliance with exercise, home  and travel O2 prescription;Maintenance of O2 saturations>88%;Compliance with respiratory medication;Verbalizes importance of monitoring SPO2 with pulse oximeter and return demonstration;Exhibits proper breathing techniques, such as pursed lip breathing or other method taught during program session;Demonstrates proper use of MDI's    Goals/Expected Outcomes For Dhani to be compliant with her oxygen and respiratory medications, know how to use a pulse oximeter, and know what to do if her oxygen <88%.             Initial Exercise Prescription:  Initial Exercise Prescription - 01/12/23 1500       Date of Initial Exercise RX and Referring Provider   Date 01/12/23    Referring Provider Ramaswamy    Expected Discharge Date 04/10/23      Oxygen   Oxygen Continuous    Liters 10    Maintain Oxygen Saturation 88% or higher      Recumbant Elliptical   Level 1    RPM 25    Minutes 15      Track   Minutes 15    METs 2.56      Prescription Details   Frequency (times per week) 2     Duration Progress to 30 minutes of continuous aerobic without signs/symptoms of physical distress      Intensity   THRR 40-80% of Max Heartrate 62-124    Ratings of  Perceived Exertion 11-13    Perceived Dyspnea 0-4      Progression   Progression Continue to progress workloads to maintain intensity without signs/symptoms of physical distress.      Resistance Training   Training Prescription Yes    Weight red bands    Reps 10-15             Perform Capillary Blood Glucose checks as needed.  Exercise Prescription Changes:   Exercise Comments:   Exercise Goals and Review:   Exercise Goals     Row Name 01/12/23 1337             Exercise Goals   Increase Physical Activity Yes       Intervention Provide advice, education, support and counseling about physical activity/exercise needs.;Develop an individualized exercise prescription for aerobic and resistive training based on initial evaluation findings, risk stratification, comorbidities and participant's personal goals.       Expected Outcomes Short Term: Attend rehab on a regular basis to increase amount of physical activity.;Long Term: Exercising regularly at least 3-5 days a week.;Long Term: Add in home exercise to make exercise part of routine and to increase amount of physical activity.       Increase Strength and Stamina Yes       Intervention Provide advice, education, support and counseling about physical activity/exercise needs.;Develop an individualized exercise prescription for aerobic and resistive training based on initial evaluation findings, risk stratification, comorbidities and participant's personal goals.       Expected Outcomes Short Term: Increase workloads from initial exercise prescription for resistance, speed, and METs.;Short Term: Perform resistance training exercises routinely during rehab and add in resistance training at home;Long Term: Improve cardiorespiratory fitness, muscular endurance and  strength as measured by increased METs and functional capacity (6MWT)       Able to understand and use rate of perceived exertion (RPE) scale Yes       Intervention Provide education and explanation on how to use RPE scale       Expected Outcomes Short Term: Able to use RPE daily in rehab to express subjective intensity level;Long Term:  Able to use RPE to guide intensity level when exercising independently       Able to understand and use Dyspnea scale Yes       Intervention Provide education and explanation on how to use Dyspnea scale       Expected Outcomes Short Term: Able to use Dyspnea scale daily in rehab to express subjective sense of shortness of breath during exertion;Long Term: Able to use Dyspnea scale to guide intensity level when exercising independently       Knowledge and understanding of Target Heart Rate Range (THRR) Yes       Intervention Provide education and explanation of THRR including how the numbers were predicted and where they are located for reference       Expected Outcomes Short Term: Able to state/look up THRR;Short Term: Able to use daily as guideline for intensity in rehab;Long Term: Able to use THRR to govern intensity when exercising independently       Understanding of Exercise Prescription Yes       Intervention Provide education, explanation, and written materials on patient's individual exercise prescription       Expected Outcomes Short Term: Able to explain program exercise prescription;Long Term: Able to explain home exercise prescription to exercise independently                Exercise  Goals Re-Evaluation :   Discharge Exercise Prescription (Final Exercise Prescription Changes):   Nutrition:  Target Goals: Understanding of nutrition guidelines, daily intake of sodium 1500mg , cholesterol 200mg , calories 30% from fat and 7% or less from saturated fats, daily to have 5 or more servings of fruits and vegetables.  Biometrics:   Post Biometrics -  01/12/23 1306        Post  Biometrics   Grip Strength 24 kg             Nutrition Therapy Plan and Nutrition Goals:   Nutrition Assessments:  MEDIFICTS Score Key: ?70 Need to make dietary changes  40-70 Heart Healthy Diet ? 40 Therapeutic Level Cholesterol Diet   Picture Your Plate Scores: [id number] Unhealthy dietary pattern with much room for improvement. 41-50 Dietary pattern unlikely to meet recommendations for good health and room for improvement. 51-60 More healthful dietary pattern, with some room for improvement.  >60 Healthy dietary pattern, although there may be some specific behaviors that could be improved.    Nutrition Goals Re-Evaluation:   Nutrition Goals Discharge (Final Nutrition Goals Re-Evaluation):   Psychosocial: Target Goals: Acknowledge presence or absence of significant depression and/or stress, maximize coping skills, provide positive support system. Participant is able to verbalize types and ability to use techniques and skills needed for reducing stress and depression.  Initial Review & Psychosocial Screening:  Initial Psych Review & Screening - 01/12/23 1327       Initial Review   Current issues with Current Depression;Current Anxiety/Panic;Current Psychotropic Meds      Family Dynamics   Good Support System? Yes    Comments Risa stated she feels like her diagnosis was a "blow to the gut". She pictured her retirement as vacations with her family and living her life to the fullest. Being diagnosed, having chemo and radiation, and now requiring high oxygen requirements has been hard for her to handle. She declines a referral currently for mental health assistance. She states she has great support from her husband and family.      Barriers   Psychosocial barriers to participate in program The patient should benefit from training in stress management and relaxation.;Psychosocial barriers identified (see note)      Screening Interventions    Interventions Encouraged to exercise;To provide support and resources with identified psychosocial needs    Expected Outcomes Long Term Goal: Stressors or current issues are controlled or eliminated.;Short Term goal: Identification and review with participant of any Quality of Life or Depression concerns found by scoring the questionnaire.;Long Term goal: The participant improves quality of Life and PHQ9 Scores as seen by post scores and/or verbalization of changes             Quality of Life Scores:  Scores of 19 and below usually indicate a poorer quality of life in these areas.  A difference of  2-3 points is a clinically meaningful difference.  A difference of 2-3 points in the total score of the Quality of Life Index has been associated with significant improvement in overall quality of life, self-image, physical symptoms, and general health in studies assessing change in quality of life.  PHQ-9: Review Flowsheet  More data exists      01/12/2023 07/25/2022 06/15/2020 06/11/2020 03/31/2020  Depression screen PHQ 2/9  Decreased Interest 2 0 0 0 1  Down, Depressed, Hopeless 2 0 0 0 1  PHQ - 2 Score 4 0 0 0 2  Altered sleeping 1 1 - - 0  Tired,  decreased energy 2 0 - - 1  Change in appetite 1 0 - - 1  Feeling bad or failure about yourself  1 0 - - 1  Trouble concentrating 1 1 - - 1  Moving slowly or fidgety/restless 0 0 - - 1  Suicidal thoughts 1 0 - - 0  PHQ-9 Score 11 2 - - 7  Difficult doing work/chores Somewhat difficult Not difficult at all - - Somewhat difficult   Interpretation of Total Score  Total Score Depression Severity:  1-4 = Minimal depression, 5-9 = Mild depression, 10-14 = Moderate depression, 15-19 = Moderately severe depression, 20-27 = Severe depression   Psychosocial Evaluation and Intervention:  Psychosocial Evaluation - 01/12/23 1449       Psychosocial Evaluation & Interventions   Interventions Encouraged to exercise with the program and follow exercise  prescription    Comments Marcine stated she feels like her diagnosis was a "blow to the gut". She pictured her retirement as vacations with her family and living her life to the fullest. Being diagnosed, having chemo and radiation, and now requiring high oxygen requirements has been hard for her to handle. She declines a referral currently for mental health assistance. She states she has great support from her husband and family.    Expected Outcomes For Heloise to participated in pulmonary rehab without any psychosocial barriers or concerns    Continue Psychosocial Services  Follow up required by staff   We will continue to monitor and assess for any needs            Psychosocial Re-Evaluation:  Psychosocial Re-Evaluation     [place] Name 01/12/23 1448             Psychosocial Re-Evaluation   Current issues with --       Comments --       Expected Outcomes --       Interventions --       Continue Psychosocial Services  --                Psychosocial Discharge (Final Psychosocial Re-Evaluation):  Psychosocial Re-Evaluation - 01/12/23 1448       Psychosocial Re-Evaluation   Current issues with --    Comments --    Expected Outcomes --    Interventions --    Continue Psychosocial Services  --             Education: Education Goals: Education classes will be provided on a weekly basis, covering required topics. Participant will state understanding/return demonstration of topics presented.  Learning Barriers/Preferences:   Education Topics: Introduction to Pulmonary Rehab Group instruction provided by PowerPoint, verbal discussion, and written material to support subject matter. Instructor reviews what Pulmonary Rehab is, the purpose of the program, and how patients are referred.     Know Your Numbers Group instruction that is supported by a PowerPoint presentation. Instructor discusses importance of knowing and understanding resting, exercise, and post-exercise  oxygen saturation, heart rate, and blood pressure. Oxygen saturation, heart rate, blood pressure, rating of perceived exertion, and dyspnea are reviewed along with a normal range for these values.    Exercise for the Pulmonary Patient Group instruction that is supported by a PowerPoint presentation. Instructor discusses benefits of exercise, core components of exercise, frequency, duration, and intensity of an exercise routine, importance of utilizing pulse oximetry during exercise, safety while exercising, and options of places to exercise outside of rehab.    MET Level  Group instruction provided by  PowerPoint, verbal discussion, and written material to support subject matter. Instructor reviews what METs are and how to increase METs.    Pulmonary Medications Verbally interactive group education provided by instructor with focus on inhaled medications and proper administration.   Anatomy and Physiology of the Respiratory System Group instruction provided by PowerPoint, verbal discussion, and written material to support subject matter. Instructor reviews respiratory cycle and anatomical components of the respiratory system and their functions. Instructor also reviews differences in obstructive and restrictive respiratory diseases with examples of each.    Oxygen Safety Group instruction provided by PowerPoint, verbal discussion, and written material to support subject matter. There is an overview of "What is Oxygen" and "Why do we need it".  Instructor also reviews how to create a safe environment for oxygen use, the importance of using oxygen as prescribed, and the risks of noncompliance. There is a brief discussion on traveling with oxygen and resources the patient may utilize.   Oxygen Use Group instruction provided by PowerPoint, verbal discussion, and written material to discuss how supplemental oxygen is prescribed and different types of oxygen supply systems. Resources for more  information are provided.    Breathing Techniques Group instruction that is supported by demonstration and informational handouts. Instructor discusses the benefits of pursed lip and diaphragmatic breathing and detailed demonstration on how to perform both.     Risk Factor Reduction Group instruction that is supported by a PowerPoint presentation. Instructor discusses the definition of a risk factor, different risk factors for pulmonary disease, and how the heart and lungs work together.   MD Day A group question and answer session with a medical doctor that allows participants to ask questions that relate to their pulmonary disease state.   Nutrition for the Pulmonary Patient Group instruction provided by PowerPoint slides, verbal discussion, and written materials to support subject matter. The instructor gives an explanation and review of healthy diet recommendations, which includes a discussion on weight management, recommendations for fruit and vegetable consumption, as well as protein, fluid, caffeine, fiber, sodium, sugar, and alcohol. Tips for eating when patients are short of breath are discussed.    Other Education Group or individual verbal, written, or video instructions that support the educational goals of the pulmonary rehab program.    Knowledge Questionnaire Score:  Knowledge Questionnaire Score - 01/12/23 1450       Knowledge Questionnaire Score   Pre Score 17/18             Core Components/Risk Factors/Patient Goals at Admission:  Personal Goals and Risk Factors at Admission - 01/12/23 1335       Core Components/Risk Factors/Patient Goals on Admission    Weight Management Weight Maintenance    Improve shortness of breath with ADL's Yes    Intervention Provide education, individualized exercise plan and daily activity instruction to help decrease symptoms of SOB with activities of daily living.    Expected Outcomes Short Term: Improve cardiorespiratory  fitness to achieve a reduction of symptoms when performing ADLs;Long Term: Be able to perform more ADLs without symptoms or delay the onset of symptoms    Increase knowledge of respiratory medications and ability to use respiratory devices properly  Yes    Intervention Provide education and demonstration as needed of appropriate use of medications, inhalers, and oxygen therapy.    Expected Outcomes Short Term: Achieves understanding of medications use. Understands that oxygen is a medication prescribed by physician. Demonstrates appropriate use of inhaler and oxygen therapy.;Long Term: Maintain appropriate  use of medications, inhalers, and oxygen therapy.             Core Components/Risk Factors/Patient Goals Review:    Core Components/Risk Factors/Patient Goals at Discharge (Final Review):    ITP Comments:   Comments: Dr. Rodman Pickle is Medical Director for Pulmonary Rehab at [facility].

## 2023-01-15 ENCOUNTER — Other Ambulatory Visit: Payer: Self-pay | Admitting: Pharmacist

## 2023-01-15 DIAGNOSIS — J849 Interstitial pulmonary disease, unspecified: Secondary | ICD-10-CM

## 2023-01-15 MED ORDER — PIRFENIDONE 267 MG PO TABS: 801.0000 mg | ORAL_TABLET | Freq: Three times a day (TID) | ORAL | 4 refills | Status: DC

## 2023-01-18 ENCOUNTER — Encounter (HOSPITAL_COMMUNITY)
Admission: RE | Admit: 2023-01-18 | Discharge: 2023-01-18 | Disposition: A | Discharge status: Home / Self Care | Payer: Medicare Other | Source: Ambulatory Visit | Attending: Internal Medicine | Admitting: Internal Medicine

## 2023-01-18 DIAGNOSIS — R0602 Shortness of breath: Secondary | ICD-10-CM | POA: Diagnosis not present

## 2023-01-18 DIAGNOSIS — J479 Bronchiectasis, uncomplicated: Secondary | ICD-10-CM | POA: Insufficient documentation

## 2023-01-18 DIAGNOSIS — J449 Chronic obstructive pulmonary disease, unspecified: Secondary | ICD-10-CM | POA: Insufficient documentation

## 2023-01-18 DIAGNOSIS — Z9221 Personal history of antineoplastic chemotherapy: Secondary | ICD-10-CM | POA: Insufficient documentation

## 2023-01-18 DIAGNOSIS — Z923 Personal history of irradiation: Secondary | ICD-10-CM | POA: Diagnosis not present

## 2023-01-18 DIAGNOSIS — Z85118 Personal history of other malignant neoplasm of bronchus and lung: Secondary | ICD-10-CM | POA: Insufficient documentation

## 2023-01-18 DIAGNOSIS — J439 Emphysema, unspecified: Secondary | ICD-10-CM | POA: Diagnosis not present

## 2023-01-18 DIAGNOSIS — J849 Interstitial pulmonary disease, unspecified: Secondary | ICD-10-CM

## 2023-01-18 NOTE — Progress Notes (Signed)
Daily Session Note  Patient Details  Name: Diane [name] MRN: [id number] Date of Birth: 1955/12/24 Referring Provider:   April Manson Pulmonary Rehab Walk Test from 01/12/2023 in [facility], & [place]  Referring Provider Ramaswamy       Encounter Date: 01/18/2023  Check In:  Session Check In - 01/18/23 1528       Check-In   Supervising physician immediately available to respond to emergencies [facility] MD immediately available    Physician(s) Coletta Memos, PA    Location [facility]    Staff Present [name], RN, Quentin Ore, MS, ACSM-CEP, Exercise Physiologist;Jetta Gilford Rile BS, ACSM-CEP, Exercise Physiologist;Wilburta Milbourn Yevonne Pax, ACSM-CEP, Exercise Physiologist;Samantha [place], RD, Idalia Needle, MS, Exercise Physiologist    Virtual Visit No    Medication changes reported     No    Fall or balance concerns reported    No    Tobacco Cessation No Change    Warm-up and Cool-down Performed as group-led instruction    Resistance Training Performed Yes    VAD Patient? No    PAD/SET Patient? No      Pain Assessment   Currently in Pain? No/denies    Multiple Pain Sites No             Capillary Blood Glucose: No results found for this or any previous visit (from the past 24 hour(s)).    Social History   Tobacco Use  Smoking Status Former   Packs/day: 0.50   Years: 30.00   Additional pack years: 0.00   Total pack years: 15.00   Types: Cigarettes   Quit date: 10/09/2021   Years since quitting: 1.2  Smokeless Tobacco Never  Tobacco Comments   5-10 cigarettes a day (plans to quit prior to this surgery 05/06/15)    Goals Met:  Exercise tolerated well No report of concerns or symptoms today Strength training completed today  Goals Unmet:  Not Applicable  Comments: Service time is from 1316 to 1450.    Dr. Rodman Pickle is Medical Director for Pulmonary Rehab at [facility].

## 2023-01-19 ENCOUNTER — Other Ambulatory Visit: Payer: Self-pay | Admitting: Acute Care

## 2023-01-23 ENCOUNTER — Encounter (HOSPITAL_COMMUNITY)
Admission: RE | Admit: 2023-01-23 | Discharge: 2023-01-23 | Disposition: A | Discharge status: Home / Self Care | Payer: Medicare Other | Source: Ambulatory Visit | Attending: Internal Medicine | Admitting: Internal Medicine

## 2023-01-23 DIAGNOSIS — J849 Interstitial pulmonary disease, unspecified: Secondary | ICD-10-CM

## 2023-01-23 NOTE — Progress Notes (Signed)
Daily Session Note  Patient Details  Name: Diane [name] MRN: [id number] Date of Birth: 1955/11/19 Referring Provider:   Doristine Devoid Pulmonary Rehab Walk Test from 01/12/2023 in [facility] for [facility], V[facility]  Referring Provider Ramaswamy       Encounter Date: 01/23/2023  Check In:  Session Check In - 01/23/23 1516       Check-In   Supervising physician immediately available to respond to emergencies [facility] MD immediately available    Physician(s) Edd Fabian, PA    Location [facility]    Staff Present Essie Hart, RN, Doris Cheadle, MS, ACSM-CEP, Exercise Physiologist;Shoichi Mielke Dionisio Paschal, ACSM-CEP, Exercise Physiologist;Samantha Belarus, RD, Debbora Dus, RT    Virtual Visit No    Medication changes reported     No    Fall or balance concerns reported    No    Tobacco Cessation No Change    Warm-up and Cool-down Performed as group-led instruction    Resistance Training Performed Yes    VAD Patient? No    PAD/SET Patient? No      Pain Assessment   Currently in Pain? No/denies             Capillary Blood Glucose: No results found for this or any previous visit (from the past 24 hour(s)).    Social History   Tobacco Use  Smoking Status Former   Packs/day: 0.50   Years: 30.00   Additional pack years: 0.00   Total pack years: 15.00   Types: Cigarettes   Quit date: 10/09/2021   Years since quitting: 1.2  Smokeless Tobacco Never  Tobacco Comments   5-10 cigarettes a day (plans to quit prior to this surgery 05/06/15)    Goals Met:  Independence with exercise equipment Exercise tolerated well No report of concerns or symptoms today Strength training completed today  Goals Unmet:  Not Applicable  Comments: Service time is from 1312 to 1440.    Dr. Mechele Collin is Medical Director for Pulmonary Rehab at [facility].

## 2023-01-24 NOTE — Progress Notes (Signed)
Pulmonary Individual Treatment Plan  Patient Details  Name: Diane [name] MRN: [id number] Date of Birth: [birth year] Referring Provider:   Doristine DevoidFlowsheet Row Pulmonary Rehab Walk Test [facility]12[facility]2023 [name]  Referring Provider Ramaswamy       Initial Encounter Date:  Flowsheet Row Pulmonary Rehab Walk Test [facility]20[name]  Date 01/12/23       Visit Diagnosis: ILD (interstitial lung disease)  Patient's Home Medications on Admission:   Current Outpatient Medications:    albuterol (PROVENTIL) (5 MG/ML) 0.5% nebulizer solution, Take 2.5 mg by nebulization every 6 (six) hours as needed for wheezing or shortness of breath., Disp: , Rfl:    ALPRAZolam (XANAX) 0.5 MG tablet, Take 0.5 mg by mouth daily as needed. (Patient not taking: Reported on 01/12/2023), Disp: , Rfl:    cetirizine (ZYRTEC) 10 MG tablet, Take 10 mg by mouth at bedtime., Disp: , Rfl:    doxycycline (VIBRA-TABS) 100 MG tablet, Take 1 tablet (100 mg total) by mouth 2 (two) times daily. (Patient not taking: Reported on 01/12/2023), Disp: 14 tablet, Rfl: 0   EPINEPHrine (AUVI-Q) 0.3 mg/0.3 mL IJ SOAJ injection, Inject 0.3 mLs (0.3 mg total) into the muscle as needed for anaphylaxis., Disp: 2 each, Rfl: 1   fluticasone (FLONASE) 50 MCG/ACT nasal spray, Place 2 sprays into both nostrils daily. (Patient taking differently: Place 2 sprays into both nostrils daily as needed for allergies.), Disp: 16 g, Rfl: 5   hydrochlorothiazide (MICROZIDE) 12.5 MG capsule, Take 12.5 mg by mouth daily., Disp: , Rfl:    HYDROcodone bit-homatropine (HYCODAN) 5-1.5 MG/5ML syrup, Take 5 mLs by mouth every 6 (six) hours as needed for cough. (Patient not taking: Reported on 01/12/2023), Disp: 240 mL, Rfl: 0   HYDROcodone bit-homatropine (HYCODAN) 5-1.5 MG/5ML syrup, Take 5 mLs by mouth every 6 (six) hours as needed for  cough. (Patient not taking: Reported on 01/12/2023), Disp: 240 mL, Rfl: 0   hydrOXYzine (ATARAX) 25 MG tablet, Take 25 mg by mouth daily. (Patient not taking: Reported on 01/12/2023), Disp: , Rfl:    omeprazole (PRILOSEC) 20 MG capsule, Take 1 capsule (20 mg total) by mouth 2 (two) times daily. (Needs to be seen before next refill), Disp: 60 capsule, Rfl: 0   OVER THE COUNTER MEDICATION, Take 1 capsule by mouth daily. Probiotic, Disp: , Rfl:    Pirfenidone (ESBRIET) 267 MG TABS, Take 3 tablets (801 mg total) by mouth 3 (three) times daily with meals. (Account: Diane [name]), Disp: 270 tablet, Rfl: 4   Pirfenidone 801 MG TABS, Month 2 and onwards: take 1 tablet (801mg ) by mouth three times daily with meals. (Account: Diane [name]), Disp: 90 tablet, Rfl: 2   rosuvastatin (CRESTOR) 10 MG tablet, Take 10 mg by mouth at bedtime., Disp: , Rfl:    umeclidinium-vilanterol (ANORO ELLIPTA) 62.5-25 MCG/ACT AEPB, Inhale 1 puff into the lungs daily., Disp: 60 each, Rfl: 3  Past Medical History: Past Medical History:  Diagnosis Date   Anxiety    Arthritis    fingers, left foot   C. difficile diarrhea 2015   COPD (chronic obstructive pulmonary disease) (HCC)    Depression    Eczema    H pylori ulcer    Headache(784.0)    Hypercholesterolemia    Hypertension    Kidney stones    20 years ago   Pneumonia    5 years ago   Seizures (  HCC)    2 years ago, "cluster of seizures" none since   Shingles     Tobacco Use: Social History   Tobacco Use  Smoking Status Former   Packs/day: 0.50   Years: 30.00   Additional pack years: 0.00   Total pack years: 15.00   Types: Cigarettes   Quit date: 10/09/2021   Years since quitting: 1.2  Smokeless Tobacco Never  Tobacco Comments   5-10 cigarettes a day (plans to quit prior to this surgery 05/06/15)    Labs: Review Flowsheet  More data exists      Latest Ref Rng & Units 01/11/2018 12/03/2019 01/06/2021 02/17/2021 10/14/2021   Labs for ITP Cardiac and Pulmonary Rehab  Cholestrol <200 mg/dL 952  841  324  401  027   LDL (calc) mg/dL (calc) 253  664  403  474  95   HDL-C > OR = 50 mg/dL 76  62  55  63  55   Trlycerides <150 mg/dL 259  563  875  643  329   Hemoglobin A1c <7.0 % 5.1  - - - -    Capillary Blood Glucose: Lab Results  Component Value Date   GLUCAP 96 10/12/2022   GLUCAP 100 (H) 12/13/2021     Pulmonary Assessment Scores:  Pulmonary Assessment Scores     Row Name 01/12/23 1436         ADL UCSD   SOB Score total 84       CAT Score   CAT Score 30       mMRC Score   mMRC Score 3             UCSD: Self-administered rating of dyspnea associated with activities of daily living (ADLs) 6-point scale (0 = "not at all" to 5 = "maximal or unable to do because of breathlessness")  Scoring Scores range from 0 to 120.  Minimally important difference is 5 units  CAT: CAT can identify the health impairment of COPD patients and is better correlated with disease progression.  CAT has a scoring range of zero to 40. The CAT score is classified into four groups of low (less than 10), medium (10 - 20), high (21-30) and very high (31-40) based on the impact level of disease on health status. A CAT score over 10 suggests significant symptoms.  A worsening CAT score could be explained by an exacerbation, poor medication adherence, poor inhaler technique, or progression of COPD or comorbid conditions.  CAT MCID is 2 points  mMRC: mMRC (Modified Medical Research Council) Dyspnea Scale is used to assess the degree of baseline functional disability in patients of respiratory disease due to dyspnea. No minimal important difference is established. A decrease in score of 1 point or greater is considered a positive change.   Pulmonary Function Assessment:  Pulmonary Function Assessment - 01/12/23 1327       Breath   Bilateral Breath Sounds Decreased;Rhonchi    Shortness of Breath Yes;Fear of Shortness of  Breath;Limiting activity             Exercise Target Goals: Exercise Program Goal: Individual exercise prescription set using results from initial 6 min walk test and THRR while considering  patient's activity barriers and safety.   Exercise Prescription Goal: Initial exercise prescription builds to 30-45 minutes a day of aerobic activity, 2-3 days per week.  Home exercise guidelines will be given to patient during program as part of exercise prescription that the participant will acknowledge.  Activity Barriers & Risk Stratification:  Activity Barriers & Cardiac Risk Stratification - 01/12/23 1433       Activity Barriers & Cardiac Risk Stratification   Activity Barriers Deconditioning;Muscular Weakness;Shortness of Breath;Arthritis             6 Minute Walk:  6 Minute Walk     Row Name 01/12/23 1451         6 Minute Walk   Phase Initial     Distance 758 feet     Walk Time 6 minutes     # of Rest Breaks 2     MPH 1.44     METS 2.56     RPE 11     Perceived Dyspnea  3     VO2 Peak 8.97     Symptoms Yes (comment)     Comments dyspnea, stopped patient for O2 sat <88% at 2:09, 2:41, and 5:25     Resting HR 93 bpm     Resting BP 104/64     Resting Oxygen Saturation  99 %     Exercise Oxygen Saturation  during 6 min walk 6 %     Max Ex. HR 119 bpm     Max Ex. BP 124/70     2 Minute Post BP 112/66       Interval HR   1 Minute HR 109     2 Minute HR 116     3 Minute HR 112     4 Minute HR 112     5 Minute HR 117     6 Minute HR 119     2 Minute Post HR 103     Interval Heart Rate? Yes       Interval Oxygen   Interval Oxygen? Yes     Baseline Oxygen Saturation % 99 %     1 Minute Oxygen Saturation % 92 %     1 Minute Liters of Oxygen 6 L     2 Minute Oxygen Saturation % 88 %  at 2:09 O2 82%, increased to 8L at 2:41 O2 84%, increased to 10L     2 Minute Liters of Oxygen 6 L  at 2:09 O2 82%, increased to 8L     3 Minute Oxygen Saturation % 90 %     3  Minute Liters of Oxygen 10 L     4 Minute Oxygen Saturation % 94 %     4 Minute Liters of Oxygen 10 L     5 Minute Oxygen Saturation % 92 %  5:45 O2 86%, PLB performed, O2 88%     5 Minute Liters of Oxygen 10 L  5:45 O2 86%, PLB performed, O2 88%     6 Minute Oxygen Saturation % 88 %     6 Minute Liters of Oxygen 10 L     2 Minute Post Oxygen Saturation % 99 %  4 min post O2 97%, HR 101, on 4L     2 Minute Post Liters of Oxygen 6 L  4 min post O2 97%, HR 101, on 4L              Oxygen Initial Assessment:  Oxygen Initial Assessment - 01/15/23 1349       Initial 6 min Walk   Oxygen Used Continuous    Liters per minute 10             Oxygen Re-Evaluation:  Oxygen Re-Evaluation     Row  Name 01/12/23 1325 01/15/23 1349 01/15/23 1433         Home Oxygen   Home Oxygen Device -- Portable Concentrator;Home Concentrator;E-Tanks Portable Concentrator;Home Concentrator;E-Tanks     Sleep Oxygen Prescription -- Continuous;CPAP Continuous;CPAP     Liters per minute 2 2 2      Home Exercise Oxygen Prescription -- Continuous Continuous     Liters per minute -- 8 8     Home Resting Oxygen Prescription -- Continuous Continuous     Liters per minute -- 5 5     Compliance with Home Oxygen Use -- Yes Yes       Goals/Expected Outcomes   Short Term Goals To learn and exhibit compliance with exercise, home and travel O2 prescription;To learn and understand importance of maintaining oxygen saturations>88%;To learn and demonstrate proper use of respiratory medications;To learn and understand importance of monitoring SPO2 with pulse oximeter and demonstrate accurate use of the pulse oximeter.;To learn and demonstrate proper pursed lip breathing techniques or other breathing techniques.  To learn and exhibit compliance with exercise, home and travel O2 prescription;To learn and understand importance of maintaining oxygen saturations>88%;To learn and demonstrate proper use of respiratory  medications;To learn and understand importance of monitoring SPO2 with pulse oximeter and demonstrate accurate use of the pulse oximeter.;To learn and demonstrate proper pursed lip breathing techniques or other breathing techniques.  To learn and exhibit compliance with exercise, home and travel O2 prescription;To learn and understand importance of maintaining oxygen saturations>88%;To learn and demonstrate proper use of respiratory medications;To learn and understand importance of monitoring SPO2 with pulse oximeter and demonstrate accurate use of the pulse oximeter.;To learn and demonstrate proper pursed lip breathing techniques or other breathing techniques.      Long  Term Goals Exhibits compliance with exercise, home  and travel O2 prescription;Maintenance of O2 saturations>88%;Compliance with respiratory medication;Verbalizes importance of monitoring SPO2 with pulse oximeter and return demonstration;Exhibits proper breathing techniques, such as pursed lip breathing or other method taught during program session;Demonstrates proper use of MDI's Exhibits compliance with exercise, home  and travel O2 prescription;Maintenance of O2 saturations>88%;Compliance with respiratory medication;Verbalizes importance of monitoring SPO2 with pulse oximeter and return demonstration;Exhibits proper breathing techniques, such as pursed lip breathing or other method taught during program session;Demonstrates proper use of MDI's Exhibits compliance with exercise, home  and travel O2 prescription;Maintenance of O2 saturations>88%;Compliance with respiratory medication;Verbalizes importance of monitoring SPO2 with pulse oximeter and return demonstration;Exhibits proper breathing techniques, such as pursed lip breathing or other method taught during program session;Demonstrates proper use of MDI's     Comments -- -- Pt to begin exercise this week.     Goals/Expected Outcomes For Diane [name] to be compliant with her oxygen and  respiratory medications, know how to use a pulse oximeter, and know what to do if her oxygen <88%. For Diane [name] to be compliant with her oxygen and respiratory medications, know how to use a pulse oximeter, and know what to do if her oxygen <88%. Compliance and understanding of oxygen saturation monitoring and breathing techniques to decrease shortness of breath.              Oxygen Discharge (Final Oxygen Re-Evaluation):  Oxygen Re-Evaluation - 01/15/23 1433       Home Oxygen   Home Oxygen Device Portable Concentrator;Home Concentrator;E-Tanks    Sleep Oxygen Prescription Continuous;CPAP    Liters per minute 2    Home Exercise Oxygen Prescription Continuous    Liters per minute 8    Home Resting  Oxygen Prescription Continuous    Liters per minute 5    Compliance with Home Oxygen Use Yes      Goals/Expected Outcomes   Short Term Goals To learn and exhibit compliance with exercise, home and travel O2 prescription;To learn and understand importance of maintaining oxygen saturations>88%;To learn and demonstrate proper use of respiratory medications;To learn and understand importance of monitoring SPO2 with pulse oximeter and demonstrate accurate use of the pulse oximeter.;To learn and demonstrate proper pursed lip breathing techniques or other breathing techniques.     Long  Term Goals Exhibits compliance with exercise, home  and travel O2 prescription;Maintenance of O2 saturations>88%;Compliance with respiratory medication;Verbalizes importance of monitoring SPO2 with pulse oximeter and return demonstration;Exhibits proper breathing techniques, such as pursed lip breathing or other method taught during program session;Demonstrates proper use of MDI's    Comments Pt to begin exercise this week.    Goals/Expected Outcomes Compliance and understanding of oxygen saturation monitoring and breathing techniques to decrease shortness of breath.             Initial Exercise Prescription:   Initial Exercise Prescription - 01/12/23 1500       Date of Initial Exercise RX and Referring Provider   Date 01/12/23    Referring Provider Ramaswamy    Expected Discharge Date 04/10/23      Oxygen   Oxygen Continuous    Liters 10    Maintain Oxygen Saturation 88% or higher      Recumbant Elliptical   Level 1    RPM 25    Minutes 15      Track   Minutes 15    METs 2.56      Prescription Details   Frequency (times per week) 2    Duration Progress to 30 minutes of continuous aerobic without signs/symptoms of physical distress      Intensity   THRR 40-80% of Max Heartrate 62-124    Ratings of Perceived Exertion 11-13    Perceived Dyspnea 0-4      Progression   Progression Continue to progress workloads to maintain intensity without signs/symptoms of physical distress.      Resistance Training   Training Prescription Yes    Weight red bands    Reps 10-15             Perform Capillary Blood Glucose checks as needed.  Exercise Prescription Changes:   Exercise Comments:   Exercise Comments     Row Name 01/18/23 1545           Exercise Comments Pt completed her first day of group exercise. Pt ambulated on the track for 15 min, METs 2.23, 8 laps. She then exercised on the recumbent elliptical for 15 min, level 1, METs 3.1. Pt performed warm up and cool down with verbal cues.                Exercise Goals and Review:   Exercise Goals     Row Name 01/12/23 1337 01/15/23 1344           Exercise Goals   Increase Physical Activity Yes Yes      Intervention Provide advice, education, support and counseling about physical activity/exercise needs.;Develop an individualized exercise prescription for aerobic and resistive training based on initial evaluation findings, risk stratification, comorbidities and participant's personal goals. Provide advice, education, support and counseling about physical activity/exercise needs.;Develop an individualized  exercise prescription for aerobic and resistive training based on initial evaluation findings, risk stratification, comorbidities  and participant's personal goals.      Expected Outcomes Short Term: Attend rehab on a regular basis to increase amount of physical activity.;Long Term: Exercising regularly at least 3-5 days a week.;Long Term: Add in home exercise to make exercise part of routine and to increase amount of physical activity. Short Term: Attend rehab on a regular basis to increase amount of physical activity.;Long Term: Exercising regularly at least 3-5 days a week.;Long Term: Add in home exercise to make exercise part of routine and to increase amount of physical activity.      Increase Strength and Stamina Yes Yes      Intervention Provide advice, education, support and counseling about physical activity/exercise needs.;Develop an individualized exercise prescription for aerobic and resistive training based on initial evaluation findings, risk stratification, comorbidities and participant's personal goals. Provide advice, education, support and counseling about physical activity/exercise needs.;Develop an individualized exercise prescription for aerobic and resistive training based on initial evaluation findings, risk stratification, comorbidities and participant's personal goals.      Expected Outcomes Short Term: Increase workloads from initial exercise prescription for resistance, speed, and METs.;Short Term: Perform resistance training exercises routinely during rehab and add in resistance training at home;Long Term: Improve cardiorespiratory fitness, muscular endurance and strength as measured by increased METs and functional capacity ( ) Short Term: Increase workloads from initial exercise prescription for resistance, speed, and METs.;Short Term: Perform resistance training exercises routinely during rehab and add in resistance training at home;Long Term: Improve cardiorespiratory fitness,  muscular endurance and strength as measured by increased METs and functional capacity ( )      Able to understand and use rate of perceived exertion (RPE) scale Yes Yes      Intervention Provide education and explanation on how to use RPE scale Provide education and explanation on how to use RPE scale      Expected Outcomes Short Term: Able to use RPE daily in rehab to express subjective intensity level;Long Term:  Able to use RPE to guide intensity level when exercising independently Short Term: Able to use RPE daily in rehab to express subjective intensity level;Long Term:  Able to use RPE to guide intensity level when exercising independently      Able to understand and use Dyspnea scale Yes Yes      Intervention Provide education and explanation on how to use Dyspnea scale Provide education and explanation on how to use Dyspnea scale      Expected Outcomes Short Term: Able to use Dyspnea scale daily in rehab to express subjective sense of shortness of breath during exertion;Long Term: Able to use Dyspnea scale to guide intensity level when exercising independently Short Term: Able to use Dyspnea scale daily in rehab to express subjective sense of shortness of breath during exertion;Long Term: Able to use Dyspnea scale to guide intensity level when exercising independently      Knowledge and understanding of Target Heart Rate Range (THRR) Yes Yes      Intervention Provide education and explanation of THRR including how the numbers were predicted and where they are located for reference Provide education and explanation of THRR including how the numbers were predicted and where they are located for reference      Expected Outcomes Short Term: Able to state/look up THRR;Short Term: Able to use daily as guideline for intensity in rehab;Long Term: Able to use THRR to govern intensity when exercising independently Short Term: Able to state/look up THRR;Short Term: Able to use daily as guideline for  intensity in rehab;Long Term: Able to use THRR to govern intensity when exercising independently      Understanding of Exercise Prescription Yes Yes      Intervention Provide education, explanation, and written materials on patient's individual exercise prescription Provide education, explanation, and written materials on patient's individual exercise prescription      Expected Outcomes Short Term: Able to explain program exercise prescription;Long Term: Able to explain home exercise prescription to exercise independently Short Term: Able to explain program exercise prescription;Long Term: Able to explain home exercise prescription to exercise independently               Exercise Goals Re-Evaluation :  Exercise Goals Re-Evaluation     Row Name 01/15/23 1344             Exercise Goal Re-Evaluation   Exercise Goals Review Increase Physical Activity;Able to understand and use Dyspnea scale;Understanding of Exercise Prescription;Increase Strength and Stamina;Able to understand and use rate of perceived exertion (RPE) scale;Knowledge and understanding of Target Heart Rate Range (THRR)       Comments Pt to begin exercising this week. It is too early to discern progression.       Expected Outcomes Through exercise at rehab and home the patient will decrease shortness of breath with daily activities and feel confident in carrying out an exercise regimn at home                Discharge Exercise Prescription (Final Exercise Prescription Changes):   Nutrition:  Target Goals: Understanding of nutrition guidelines, daily intake of sodium 1500mg , cholesterol 200mg , calories 30% from fat and 7% or less from saturated fats, daily to have 5 or more servings of fruits and vegetables.  Biometrics:   Post Biometrics - 01/12/23 1306        Post  Biometrics   Grip Strength 24 kg             Nutrition Therapy Plan and Nutrition Goals:  Nutrition Therapy & Goals - 01/18/23 1504        Nutrition Therapy   Diet General Healthy Diet    Drug/Food Interactions Statins/Certain Fruits      Personal Nutrition Goals   Nutrition Goal Patient to improve diet quality by using the plate method as a guide for meal planning to include lean protein/plant protein, fruits, vegetables, whole grains, nonfat dairy as part of a well-balanced diet.    Personal Goal #2 Patient to maintain weight while participating in pulmonary rehab program.    Comments Diane [name] recently increased dose of Esbriet and continues to struggle with nausea, vomiting. Discussed small, frequent meals and easy to digest foods to aid with weight maintenance when not feeling well. She does enjoy a wide variety of foods. She has medical history of Alpha Gal, squamous cell carcinoma of right lung. Diane [name] will continue to benefit from pulmonary rehab for nutrition and exercise support.      Intervention Plan   Intervention Prescribe, educate and counsel regarding individualized specific dietary modifications aiming towards targeted core components such as weight, hypertension, lipid management, diabetes, heart failure and other comorbidities.;Nutrition handout(s) given to patient.    Expected Outcomes Short Term Goal: Understand basic principles of dietary content, such as calories, fat, sodium, cholesterol and nutrients.;Long Term Goal: Adherence to prescribed nutrition plan.             Nutrition Assessments:  MEDIFICTS Score Key: ?70 Need to make dietary changes  40-70 Heart Healthy Diet ? 40 Therapeutic Level  Cholesterol Diet   Picture Your Plate Scores: <40 Unhealthy dietary pattern with much room for improvement. 41-50 Dietary pattern unlikely to meet recommendations for good health and room for improvement. 51-60 More healthful dietary pattern, with some room for improvement.  >60 Healthy dietary pattern, although there may be some specific behaviors that could be improved.    Nutrition Goals Re-Evaluation:   Nutrition Goals Re-Evaluation     Row Name 01/18/23 1504             Goals   Current Weight 144 lb 13.5 oz (65.7 kg)       Comment Lipids WNL (2022)       Expected Outcome Diane [name] recently increased dose of Esbriet and continues to struggle with nausea, vomiting. Discussed small, frequent meals and easy to digest foods to aid with weight maintenance when not feeling well. She does enjoy a wide variety of foods. She has medical history of Alpha Gal, squamous cell carcinoma of right lung. Diane [name] will continue to benefit from pulmonary rehab for nutrition and exercise support.                Nutrition Goals Discharge (Final Nutrition Goals Re-Evaluation):  Nutrition Goals Re-Evaluation - 01/18/23 1504       Goals   Current Weight 144 lb 13.5 oz (65.7 kg)    Comment Lipids WNL (2022)    Expected Outcome Diane [name] recently increased dose of Esbriet and continues to struggle with nausea, vomiting. Discussed small, frequent meals and easy to digest foods to aid with weight maintenance when not feeling well. She does enjoy a wide variety of foods. She has medical history of Alpha Gal, squamous cell carcinoma of right lung. Diane [name] will continue to benefit from pulmonary rehab for nutrition and exercise support.             Psychosocial: Target Goals: Acknowledge presence or absence of significant depression and/or stress, maximize coping skills, provide positive support system. Participant is able to verbalize types and ability to use techniques and skills needed for reducing stress and depression.  Initial Review & Psychosocial Screening:  Initial Psych Review & Screening - 01/12/23 1327       Initial Review   Current issues with Current Depression;Current Anxiety/Panic;Current Psychotropic Meds      Family Dynamics   Good Support System? Yes    Comments Diane [name] stated she feels like her diagnosis was a "blow to the gut". She pictured her retirement as vacations with her family  and living her life to the fullest. Being diagnosed, having chemo and radiation, and now requiring high oxygen requirements has been hard for her to handle. She declines a referral currently for mental health assistance. She states she has great support from her husband and family.      Barriers   Psychosocial barriers to participate in program The patient should benefit from training in stress management and relaxation.;Psychosocial barriers identified (see note)      Screening Interventions   Interventions Encouraged to exercise;To provide support and resources with identified psychosocial needs    Expected Outcomes Long Term Goal: Stressors or current issues are controlled or eliminated.;Short Term goal: Identification and review with participant of any Quality of Life or Depression concerns found by scoring the questionnaire.;Long Term goal: The participant improves quality of Life and PHQ9 Scores as seen by post scores and/or verbalization of changes             Quality of Life Scores:  Scores of 19  and below usually indicate a poorer quality of life in these areas.  A difference of  2-3 points is a clinically meaningful difference.  A difference of 2-3 points in the total score of the Quality of Life Index has been associated with significant improvement in overall quality of life, self-image, physical symptoms, and general health in studies assessing change in quality of life.  PHQ-9: Review Flowsheet  More data exists      01/12/2023 07/25/2022 06/15/2020 06/11/2020 03/31/2020  Depression screen PHQ 2/9  Decreased Interest 2 0 0 0 1  Down, Depressed, Hopeless 2 0 0 0 1  PHQ - 2 Score 4 0 0 0 2  Altered sleeping 1 1 - - 0  Tired, decreased energy 2 0 - - 1  Change in appetite 1 0 - - 1  Feeling bad or failure about yourself  1 0 - - 1  Trouble concentrating 1 1 - - 1  Moving slowly or fidgety/restless 0 0 - - 1  Suicidal thoughts 1 0 - - 0  PHQ-9 Score 11 2 - - 7  Difficult  doing work/chores Somewhat difficult Not difficult at all - - Somewhat difficult   Interpretation of Total Score  Total Score Depression Severity:  1-4 = Minimal depression, 5-9 = Mild depression, 10-14 = Moderate depression, 15-19 = Moderately severe depression, 20-27 = Severe depression   Psychosocial Evaluation and Intervention:  Psychosocial Evaluation - 01/12/23 1449       Psychosocial Evaluation & Interventions   Interventions Encouraged to exercise with the program and follow exercise prescription    Comments Diane [name] stated she feels like her diagnosis was a "blow to the gut". She pictured her retirement as vacations with her family and living her life to the fullest. Being diagnosed, having chemo and radiation, and now requiring high oxygen requirements has been hard for her to handle. She declines a referral currently for mental health assistance. She states she has great support from her husband and family.    Expected Outcomes For Diane [name] to participated in pulmonary rehab without any psychosocial barriers or concerns    Continue Psychosocial Services  Follow up required by staff   We will continue to monitor and assess for any needs            Psychosocial Re-Evaluation:  Psychosocial Re-Evaluation     Row Name 01/12/23 1448             Psychosocial Re-Evaluation   Current issues with Current Depression;Current Anxiety/Panic;Current Psychotropic Meds       Comments No new changes since orientation on 01/12/23. Diane [name] is scheduled to start the program on 01/18/2023       Expected Outcomes For Diane [name] to attend PR without any psychosocial barriers or concerns       Interventions Encouraged to attend Pulmonary Rehabilitation for the exercise       Continue Psychosocial Services  Follow up required by staff  We will continue to monitor and assess Diane [name]                Psychosocial Discharge (Final Psychosocial Re-Evaluation):  Psychosocial Re-Evaluation - 01/12/23  1448       Psychosocial Re-Evaluation   Current issues with Current Depression;Current Anxiety/Panic;Current Psychotropic Meds    Comments No new changes since orientation on 01/12/23. Diane [name] is scheduled to start the program on 01/18/2023    Expected Outcomes For Diane [name] to attend PR without any psychosocial barriers or concerns    Interventions Encouraged  to attend Pulmonary Rehabilitation for the exercise    Continue Psychosocial Services  Follow up required by staff   We will continue to monitor and assess Diane [name]            Education: Education Goals: Education classes will be provided on a weekly basis, covering required topics. Participant will state understanding/return demonstration of topics presented.  Learning Barriers/Preferences:   Education Topics: Introduction to Pulmonary Rehab Group instruction provided by PowerPoint, verbal discussion, and written material to support subject matter. Instructor reviews what Pulmonary Rehab is, the purpose of the program, and how patients are referred.     Know Your Numbers Group instruction that is supported by a PowerPoint presentation. Instructor discusses importance of knowing and understanding resting, exercise, and post-exercise oxygen saturation, heart rate, and blood pressure. Oxygen saturation, heart rate, blood pressure, rating of perceived exertion, and dyspnea are reviewed along with a normal range for these values.    Exercise for the Pulmonary Patient Group instruction that is supported by a PowerPoint presentation. Instructor discusses benefits of exercise, core components of exercise, frequency, duration, and intensity of an exercise routine, importance of utilizing pulse oximetry during exercise, safety while exercising, and options of places to exercise outside of rehab.  Flowsheet Row PULMONARY REHAB OTHER RESPIRATORY from 01/18/2023 in [name]  Date  01/18/23  Educator EP  Instruction Review Code 1- Verbalizes Understanding          MET Level  Group instruction provided by PowerPoint, verbal discussion, and written material to support subject matter. Instructor reviews what METs are and how to increase METs.    Pulmonary Medications Verbally interactive group education provided by instructor with focus on inhaled medications and proper administration.   Anatomy and Physiology of the Respiratory System Group instruction provided by PowerPoint, verbal discussion, and written material to support subject matter. Instructor reviews respiratory cycle and anatomical components of the respiratory system and their functions. Instructor also reviews differences in obstructive and restrictive respiratory diseases with examples of each.    Oxygen Safety Group instruction provided by PowerPoint, verbal discussion, and written material to support subject matter. There is an overview of "What is Oxygen" and "Why do we need it".  Instructor also reviews how to create a safe environment for oxygen use, the importance of using oxygen as prescribed, and the risks of noncompliance. There is a brief discussion on traveling with oxygen and resources the patient may utilize.   Oxygen Use Group instruction provided by PowerPoint, verbal discussion, and written material to discuss how supplemental oxygen is prescribed and different types of oxygen supply systems. Resources for more information are provided.    Breathing Techniques Group instruction that is supported by demonstration and informational handouts. Instructor discusses the benefits of pursed lip and diaphragmatic breathing and detailed demonstration on how to perform both.     Risk Factor Reduction Group instruction that is supported by a PowerPoint presentation. Instructor discusses the definition of a risk factor, different risk factors for pulmonary disease, and how the heart and lungs  work together.   MD Day A group question and answer session with a medical doctor that allows participants to ask questions that relate to their pulmonary disease state.   Nutrition for the Pulmonary Patient Group instruction provided by PowerPoint slides, verbal discussion, and written materials to support subject matter. The instructor gives an explanation and review of healthy diet recommendations, which includes a discussion on weight  management, recommendations for fruit and vegetable consumption, as well as protein, fluid, caffeine, fiber, sodium, sugar, and alcohol. Tips for eating when patients are short of breath are discussed.    Other Education Group or individual verbal, written, or video instructions that support the educational goals of the pulmonary rehab program.    Knowledge Questionnaire Score:  Knowledge Questionnaire Score - 01/12/23 1450       Knowledge Questionnaire Score   Pre Score 17/18             Core Components/Risk Factors/Patient Goals at Admission:  Personal Goals and Risk Factors at Admission - 01/12/23 1335       Core Components/Risk Factors/Patient Goals on Admission    Weight Management Weight Maintenance    Improve shortness of breath with ADL's Yes    Intervention Provide education, individualized exercise plan and daily activity instruction to help decrease symptoms of SOB with activities of daily living.    Expected Outcomes Short Term: Improve cardiorespiratory fitness to achieve a reduction of symptoms when performing ADLs;Long Term: Be able to perform more ADLs without symptoms or delay the onset of symptoms    Increase knowledge of respiratory medications and ability to use respiratory devices properly  Yes    Intervention Provide education and demonstration as needed of appropriate use of medications, inhalers, and oxygen therapy.    Expected Outcomes Short Term: Achieves understanding of medications use. Understands that oxygen is a  medication prescribed by physician. Demonstrates appropriate use of inhaler and oxygen therapy.;Long Term: Maintain appropriate use of medications, inhalers, and oxygen therapy.             Core Components/Risk Factors/Patient Goals Review:   Goals and Risk Factor Review     Row Name 01/17/23 1537             Core Components/Risk Factors/Patient Goals Review   Personal Goals Review Weight Management/Obesity;Improve shortness of breath with ADL's;Develop more efficient breathing techniques such as purse lipped breathing and diaphragmatic breathing and practicing self-pacing with activity.;Increase knowledge of respiratory medications and ability to use respiratory devices properly.       Review Unable to assess goals since Bronson Curb has not started the program yet. She is scheduled to start on 01/18/2023       Expected Outcomes See Admission Goals                Core Components/Risk Factors/Patient Goals at Discharge (Final Review):   Goals and Risk Factor Review - 01/17/23 1537       Core Components/Risk Factors/Patient Goals Review   Personal Goals Review Weight Management/Obesity;Improve shortness of breath with ADL's;Develop more efficient breathing techniques such as purse lipped breathing and diaphragmatic breathing and practicing self-pacing with activity.;Increase knowledge of respiratory medications and ability to use respiratory devices properly.    Review Unable to assess goals since Bronson Curb has not started the program yet. She is scheduled to start on 01/18/2023    Expected Outcomes See Admission Goals             ITP Comments:Pt is making expected progress toward Pulmonary Rehab goals after completing 2 sessions. Recommend continued exercise, life style modification, education, and utilization of breathing techniques to increase stamina and strength, while also decreasing shortness of breath with exertion.  Dr. Mechele Collin is Medical Director for Pulmonary Rehab at  [name].

## 2023-01-25 ENCOUNTER — Inpatient Hospital Stay: Payer: Medicare Other | Attending: Internal Medicine

## 2023-01-25 ENCOUNTER — Encounter (HOSPITAL_COMMUNITY)
Admission: RE | Admit: 2023-01-25 | Discharge: 2023-01-25 | Disposition: A | Discharge status: Home / Self Care | Payer: Medicare Other | Source: Ambulatory Visit | Attending: Internal Medicine | Admitting: Internal Medicine

## 2023-01-25 ENCOUNTER — Ambulatory Visit (HOSPITAL_COMMUNITY)
Admission: RE | Admit: 2023-01-25 | Discharge: 2023-01-25 | Disposition: A | Discharge status: Home / Self Care | Payer: Medicare Other | Source: Ambulatory Visit | Attending: Internal Medicine | Admitting: Internal Medicine

## 2023-01-25 DIAGNOSIS — C3490 Malignant neoplasm of unspecified part of unspecified bronchus or lung: Secondary | ICD-10-CM

## 2023-01-25 DIAGNOSIS — J449 Chronic obstructive pulmonary disease, unspecified: Secondary | ICD-10-CM

## 2023-01-25 DIAGNOSIS — J849 Interstitial pulmonary disease, unspecified: Secondary | ICD-10-CM | POA: Diagnosis not present

## 2023-01-25 DIAGNOSIS — J479 Bronchiectasis, uncomplicated: Secondary | ICD-10-CM

## 2023-01-25 LAB — CMP (CANCER CENTER ONLY)
ALT: 11 U/L (ref 0–44)
AST: 16 U/L (ref 15–41)
Albumin: 4.1 g/dL (ref 3.5–5.0)
Alkaline Phosphatase: 64 U/L (ref 38–126)
Anion gap: 6 (ref 5–15)
BUN: 16 mg/dL (ref 8–23)
CO2: 32 mmol/L (ref 22–32)
Calcium: 9.8 mg/dL (ref 8.9–10.3)
Chloride: 102 mmol/L (ref 98–111)
Creatinine: 0.65 mg/dL (ref 0.44–1.00)
GFR, Estimated: >60 mL/min (ref >60)
Glucose, Bld: 96 mg/dL (ref 70–99)
Potassium: 4.1 mmol/L (ref 3.5–5.1)
Sodium: 140 mmol/L (ref 135–145)
Total Bilirubin: 0.3 mg/dL (ref 0.3–1.2)
Total Protein: 8.1 g/dL (ref 6.5–8.1)

## 2023-01-25 LAB — CBC WITH DIFFERENTIAL (CANCER CENTER ONLY)
Abs Immature Granulocytes: 0.04 10*3/uL (ref 0.00–0.07)
Basophils Absolute: 0 10*3/uL (ref 0.0–0.1)
Basophils Relative: 1 %
Eosinophils Absolute: 0.3 10*3/uL (ref 0.0–0.5)
Eosinophils Relative: 4 %
HCT: 33.3 % — ABNORMAL LOW (ref 36.0–46.0)
Hemoglobin: 11.3 g/dL — ABNORMAL LOW (ref 12.0–15.0)
Immature Granulocytes: 1 %
Lymphocytes Relative: 16 %
Lymphs Abs: 1.2 10*3/uL (ref 0.7–4.0)
MCH: 31.6 pg (ref 26.0–34.0)
MCHC: 33.9 g/dL (ref 30.0–36.0)
MCV: 93 fL (ref 80.0–100.0)
Monocytes Absolute: 0.7 10*3/uL (ref 0.1–1.0)
Monocytes Relative: 9 %
Neutro Abs: 5.3 10*3/uL (ref 1.7–7.7)
Neutrophils Relative %: 69 %
Platelet Count: 296 10*3/uL (ref 150–400)
RBC: 3.58 MIL/uL — ABNORMAL LOW (ref 3.87–5.11)
RDW: 12.8 % (ref 11.5–15.5)
WBC Count: 7.5 10*3/uL (ref 4.0–10.5)
nRBC: 0 % (ref 0.0–0.2)

## 2023-01-25 MED ORDER — SODIUM CHLORIDE (PF) 0.9 % IJ SOLN
INTRAMUSCULAR | Status: AC
  Filled 2023-01-25: qty 50

## 2023-01-25 MED ORDER — IOHEXOL 300 MG/ML  SOLN
75.0000 mL | Freq: Once | INTRAMUSCULAR | Status: AC | PRN
  Administered 2023-01-25: 75 mL via INTRAVENOUS

## 2023-01-25 NOTE — Progress Notes (Signed)
Daily Session Note  Patient Details  Name: Diane [name] MRN: [id number] Date of Birth: November 12, 1955 Referring Provider:   Doristine Devoid Pulmonary Rehab Walk Test from 01/12/2023 in [facility]  Referring Provider Ramaswamy       Encounter Date: 01/25/2023  Check In:  Session Check In - 01/25/23 1524       Check-In   Supervising physician immediately available to respond to emergencies [facility] MD immediately available    Physician(s) Carlyon Shadow    Location [facility]    Staff Present Essie Hart, RN, Doris Cheadle, MS, ACSM-CEP, Exercise Physiologist;Randi Dionisio Paschal, ACSM-CEP, Exercise Physiologist;Samantha Belarus, [place], Debbora Dus, [place]    Virtual Visit No    Medication changes reported     No    Fall or balance concerns reported    No    Tobacco Cessation No Change    Warm-up and Cool-down Performed as group-led instruction    Resistance Training Performed Yes    VAD Patient? No    PAD/SET Patient? No      Pain Assessment   Currently in Pain? No/denies    Multiple Pain Sites No             Capillary Blood Glucose: Results for orders placed or performed in visit on 01/25/23 (from the past 24 hour(s))  CMP (Cancer Center only)     Status: None   Collection Time: 01/25/23  9:55 AM  Result Value Ref Range   Sodium 140 135 - 145 mmol/L   Potassium 4.1 3.5 - 5.1 mmol/L   Chloride 102 98 - 111 mmol/L   CO2 32 22 - 32 mmol/L   Glucose, Bld 96 70 - 99 mg/dL   BUN 16 8 - 23 mg/dL   Creatinine 1.85 6.31 - 1.00 mg/dL   Calcium 9.8 8.9 - 49.7 mg/dL   Total Protein 8.1 6.5 - 8.1 g/dL   Albumin 4.1 3.5 - 5.0 g/dL   AST 16 15 - 41 U/L   ALT 11 0 - 44 U/L   Alkaline Phosphatase 64 38 - 126 U/L   Total Bilirubin 0.3 0.3 - 1.2 mg/dL   GFR, Estimated >02 >63 mL/min   Anion gap 6 5 - 15  CBC with Differential (Cancer Center Only)     Status: Abnormal   Collection Time: 01/25/23  9:55 AM   Result Value Ref Range   WBC Count 7.5 4.0 - 10.5 K/uL   RBC 3.58 (L) 3.87 - 5.11 MIL/uL   Hemoglobin 11.3 (L) 12.0 - 15.0 g/dL   HCT 78.5 (L) 88.5 - 02.7 %   MCV 93.0 80.0 - 100.0 fL   MCH 31.6 26.0 - 34.0 pg   MCHC 33.9 30.0 - 36.0 g/dL   RDW 74.1 28.7 - 86.7 %   Platelet Count 296 150 - 400 K/uL   nRBC 0.0 0.0 - 0.2 %   Neutrophils Relative % 69 %   Neutro Abs 5.3 1.7 - 7.7 K/uL   Lymphocytes Relative 16 %   Lymphs Abs 1.2 0.7 - 4.0 K/uL   Monocytes Relative 9 %   Monocytes Absolute 0.7 0.1 - 1.0 K/uL   Eosinophils Relative 4 %   Eosinophils Absolute 0.3 0.0 - 0.5 K/uL   Basophils Relative 1 %   Basophils Absolute 0.0 0.0 - 0.1 K/uL   Immature Granulocytes 1 %   Abs Immature Granulocytes 0.04 0.00 - 0.07 K/uL  Social History   Tobacco Use  Smoking Status Former   Packs/day: 0.50   Years: 30.00   Additional pack years: 0.00   Total pack years: 15.00   Types: Cigarettes   Quit date: 10/09/2021   Years since quitting: 1.2  Smokeless Tobacco Never  Tobacco Comments   5-10 cigarettes a day (plans to quit prior to this surgery 05/06/15)    Goals Met:  Independence with exercise equipment Exercise tolerated well No report of concerns or symptoms today Strength training completed today  Goals Unmet:  Not Applicable  Comments: Service time is from 1323 to 1505    Dr. Mechele Collin is Medical Director for Pulmonary Rehab at [facility].

## 2023-01-26 DIAGNOSIS — J849 Interstitial pulmonary disease, unspecified: Secondary | ICD-10-CM

## 2023-01-29 ENCOUNTER — Inpatient Hospital Stay (HOSPITAL_BASED_OUTPATIENT_CLINIC_OR_DEPARTMENT_OTHER): Payer: Medicare Other | Admitting: Internal Medicine

## 2023-01-29 VITALS — BP 106/63 | HR 91 | Temp 98.1°F | Resp 16 | Wt 143.4 lb

## 2023-01-29 DIAGNOSIS — C3490 Malignant neoplasm of unspecified part of unspecified bronchus or lung: Secondary | ICD-10-CM

## 2023-01-29 DIAGNOSIS — J849 Interstitial pulmonary disease, unspecified: Secondary | ICD-10-CM | POA: Diagnosis not present

## 2023-01-29 NOTE — Progress Notes (Signed)
[facility] [phone]   [phone]  OFFICE PROGRESS NOTE  Ignatius Specking, MD [address] [place] [address]  DIAGNOSIS: Unresectable stage IIB (T2a, N1, M0) non-small cell lung cancer, squamous cell carcinoma presented with right lower lobe lung mass in addition to right hilar lymphadenopathy diagnosed in March 2023.    The patient had PD-L1 expression of 99%.  PRIOR THERAPY: A course of concurrent chemoradiation with weekly carboplatin for AUC of 2 and paclitaxel 45 Mg/M2.  First dose started 01/16/2022.  Status post 7 cycles.  Last dose was given on 02/27/2022.  CURRENT THERAPY: Observation.  INTERVAL HISTORY: Leon Branscum 67 y.o. female returns to the clinic today for follow-up visit accompanied by her husband.  The patient is feeling fine except for the fatigue and shortness of breath at baseline increased with exertion and she is currently on home oxygen.  She was seen by Dr. Marchelle Gearing for evaluation of her emphysema as well as interstitial lung disease and she is currently undergoing pulmonary rehabilitation.  She denied having any chest pain or hemoptysis.  She has no nausea, vomiting, diarrhea or constipation.  She has no headache or visual changes.  She had repeat CT scan of the chest performed recently and she is here for evaluation and discussion of her scan results.  MEDICAL HISTORY: Past Medical History:  Diagnosis Date   Anxiety    Arthritis    fingers, left foot   C. difficile diarrhea 2015   COPD (chronic obstructive pulmonary disease) (HCC)    Depression    Eczema    H pylori ulcer    Headache(784.0)    Hypercholesterolemia    Hypertension    Kidney stones    20 years ago   Pneumonia    5 years ago   Seizures (HCC)    2 years ago, "cluster of seizures" none since   Shingles     ALLERGIES:  is allergic to alpha-gal, other, and penicillins.  MEDICATIONS:  Current Outpatient Medications  Medication Sig Dispense  Refill   albuterol (PROVENTIL) (5 MG/ML) 0.5% nebulizer solution Take 2.5 mg by nebulization every 6 (six) hours as needed for wheezing or shortness of breath.     ALPRAZolam (XANAX) 0.5 MG tablet Take 0.5 mg by mouth daily as needed. (Patient not taking: Reported on 01/12/2023)     cetirizine (ZYRTEC) 10 MG tablet Take 10 mg by mouth at bedtime.     doxycycline (VIBRA-TABS) 100 MG tablet Take 1 tablet (100 mg total) by mouth 2 (two) times daily. (Patient not taking: Reported on 01/12/2023) 14 tablet 0   EPINEPHrine (AUVI-Q) 0.3 mg/0.3 mL IJ SOAJ injection Inject 0.3 mLs (0.3 mg total) into the muscle as needed for anaphylaxis. 2 each 1   fluticasone (FLONASE) 50 MCG/ACT nasal spray Place 2 sprays into both nostrils daily. (Patient taking differently: Place 2 sprays into both nostrils daily as needed for allergies.) 16 g 5   hydrochlorothiazide (MICROZIDE) 12.5 MG capsule Take 12.5 mg by mouth daily.     HYDROcodone bit-homatropine (HYCODAN) 5-1.5 MG/5ML syrup Take 5 mLs by mouth every 6 (six) hours as needed for cough. (Patient not taking: Reported on 01/12/2023) 240 mL 0   HYDROcodone bit-homatropine (HYCODAN) 5-1.5 MG/5ML syrup Take 5 mLs by mouth every 6 (six) hours as needed for cough. (Patient not taking: Reported on 01/12/2023) 240 mL 0   hydrOXYzine (ATARAX) 25 MG tablet Take 25 mg by mouth daily. (Patient not taking:  Reported on 01/12/2023)     omeprazole (PRILOSEC) 20 MG capsule Take 1 capsule (20 mg total) by mouth 2 (two) times daily. (Needs to be seen before next refill) 60 capsule 0   OVER THE COUNTER MEDICATION Take 1 capsule by mouth daily. Probiotic     Pirfenidone (ESBRIET) 267 MG TABS Take 3 tablets (801 mg total) by mouth 3 (three) times daily with meals. (Account: [email or web address]) 270 tablet 4   Pirfenidone 801 MG TABS Month 2 and onwards: take 1 tablet (801mg ) by mouth three times daily with meals. (Account: [email or web address]) 90 tablet 2   rosuvastatin  (CRESTOR) 10 MG tablet Take 10 mg by mouth at bedtime.     umeclidinium-vilanterol (ANORO ELLIPTA) 62.5-25 MCG/ACT AEPB Inhale 1 puff into the lungs daily. 60 each 3   No current facility-administered medications for this visit.    SURGICAL HISTORY:  Past Surgical History:  Procedure Laterality Date   ABDOMINAL HYSTERECTOMY     ADENOIDECTOMY     APPENDECTOMY     BRONCHIAL BIOPSY  12/20/2021   Procedure: BRONCHIAL BIOPSIES;  Surgeon: Josephine Igo, DO;  Location: [facility];  Service: Pulmonary;;   BRONCHIAL BRUSHINGS  12/20/2021   Procedure: BRONCHIAL BRUSHINGS;  Surgeon: Josephine Igo, DO;  Location: [facility];  Service: Pulmonary;;   BRONCHIAL NEEDLE ASPIRATION BIOPSY  12/20/2021   Procedure: BRONCHIAL NEEDLE ASPIRATION BIOPSIES;  Surgeon: Josephine Igo, DO;  Location: [facility];  Service: Pulmonary;;   CESAREAN SECTION     CHOLECYSTECTOMY     COLONOSCOPY  06/10/2012   RMR: Colonic polyps -removed as described above.    FINE NEEDLE ASPIRATION  12/20/2021   Procedure: FINE NEEDLE ASPIRATION (FNA) LINEAR;  Surgeon: Josephine Igo, DO;  Location: [facility];  Service: Pulmonary;;   KNEE ARTHROSCOPY  1973   right knee,  torn cart   LUMBAR DISC SURGERY  05/12/2015   L4 L5   LUMBAR LAMINECTOMY/DECOMPRESSION MICRODISCECTOMY Left 05/12/2015   Procedure: Left L4-5 Microdiscectomy;  Surgeon: Eldred Manges, MD;  Location: [facility];  Service: Orthopedics;  Laterality: Left;   TONSILLECTOMY     VIDEO BRONCHOSCOPY WITH ENDOBRONCHIAL ULTRASOUND Bilateral 12/20/2021   Procedure: VIDEO BRONCHOSCOPY WITH ENDOBRONCHIAL ULTRASOUND;  Surgeon: Josephine Igo, DO;  Location: [facility];  Service: Pulmonary;  Laterality: Bilateral;   VIDEO BRONCHOSCOPY WITH RADIAL ENDOBRONCHIAL ULTRASOUND  12/20/2021   Procedure: RADIAL ENDOBRONCHIAL ULTRASOUND;  Surgeon: Josephine Igo, DO;  Location: [facility];  Service: Pulmonary;;    REVIEW OF SYSTEMS:  A comprehensive review of systems was negative  except for: Constitutional: positive for fatigue Respiratory: positive for dyspnea on exertion   PHYSICAL EXAMINATION: General appearance: alert, cooperative, and no distress Head: Normocephalic, without obvious abnormality, atraumatic Neck: no adenopathy, no JVD, supple, symmetrical, trachea midline, and thyroid not enlarged, symmetric, no tenderness/mass/nodules Lymph nodes: Cervical, supraclavicular, and axillary nodes normal. Resp: rales bilaterally Back: symmetric, no curvature. ROM normal. No CVA tenderness. Cardio: regular rate and rhythm, S1, S2 normal, no murmur, click, rub or gallop GI: soft, non-tender; bowel sounds normal; no masses,  no organomegaly Extremities: extremities normal, atraumatic, no cyanosis or edema  ECOG PERFORMANCE STATUS: 1 - Symptomatic but completely ambulatory  Blood pressure 106/63, pulse 91, temperature 98.1 F (36.7 C), temperature source Oral, resp. rate 16, weight 143 lb 6.4 oz (65 kg), SpO2 98 %.  LABORATORY DATA: Lab Results  Component Value Date   WBC 7.5 01/25/2023   HGB 11.3 (L) 01/25/2023   HCT 33.3 (L) 01/25/2023  MCV 93.0 01/25/2023   PLT 296 01/25/2023      Chemistry      Component Value Date/Time   NA 140 01/25/2023 0955   NA 139 12/03/2019 0928   K 4.1 01/25/2023 0955   CL 102 01/25/2023 0955   CO2 32 01/25/2023 0955   BUN 16 01/25/2023 0955   BUN 14 12/03/2019 0928   CREATININE 0.65 01/25/2023 0955   CREATININE 0.64 10/14/2021 0953      Component Value Date/Time   CALCIUM 9.8 01/25/2023 0955   ALKPHOS 64 01/25/2023 0955   AST 16 01/25/2023 0955   ALT 11 01/25/2023 0955   BILITOT 0.3 01/25/2023 0955       RADIOGRAPHIC STUDIES: CT Chest W Contrast  Result Date: 01/27/2023 CLINICAL DATA:  Non-small-cell lung cancer staging. * Tracking Code: BO * EXAM: CT CHEST WITH CONTRAST TECHNIQUE: Multidetector CT imaging of the chest was performed during intravenous contrast administration. RADIATION DOSE REDUCTION: This exam  was performed according to the departmental dose-optimization program which includes automated exposure control, adjustment of the mA and/or kV according to patient size and/or use of iterative reconstruction technique. CONTRAST:  75mL OMNIPAQUE IOHEXOL 300 MG/ML  SOLN COMPARISON:  PET-CT 10/12/2022.  Chest CT 09/26/2022 and older FINDINGS: Cardiovascular: Heart is nonenlarged. No pericardial effusion. The thoracic aorta has a normal course and caliber with scattered atherosclerotic calcified plaque. There is a bovine type aortic arch, a normal variant. There is some enlargement of the main pulmonary arteries. Please correlate for any evidence of pulmonary artery hypertension. Coronary artery calcifications are seen. Please correlate for other coronary risk factors. Mediastinum/Nodes: Small hiatal hernia. Stable thyroid gland. No specific abnormal lymph node enlargement present in the axillary regions. There are some prominent hilar nodes identified. Example on the left on series 2, image 69 measures 14 by 9 mm today. Going back to a contrast study of September 2023 this same node would have measured 14 by 9 mm, similar. Fullness seen in the right lung hilum is also likely related to some small nodes and is less well defined. Areas increased from September 2023 and similar to more recent studies. There also some prominent mediastinal nodes. Example right paratracheal series 2, image 40 today measures 12 by 10 mm and previously 14 x 8 mm, similar. Other nodes in the mediastinum also are similar going back to the exam. These are also similar to the recent PET-CT of December 2023. At that time these nodes were not hypermetabolic. Lungs/Pleura: Scattered emphysematous changes are seen with paraseptal and centrilobular changes. There also components of fibrosis and honeycombing and traction bronchiectasis. No left-sided pleural effusion, consolidation. Areas of patchy ground-glass identified in the left hemithorax such  as posterior left upper lobe on series 5, image 31 which is new from the study of December 2023. Is also increasing areas in the superior segment of the left lower lobe. Right lung has areas of pleural thickening some loculated fluid at the level of the superior segment of the right lower lobe which is increased from the prior PET-CT scan. The associated opacity in this location is also increasing and appears somewhat nodular as best seen on series 2, image 66 going back to the older study from September 2023. Previous discrete nodule in this location which is less appreciated. Likely obscured by the adjacent opacity. Separately there is focal spiculated nodule just above the diaphragm in the right lower lobe on series 5, image 91 measuring 12 by 10 mm. On the prior PET-CT this  measured 13 x 8 mm and does show abnormal uptake. There is also a hypermetabolic nodule in the right upper lobe which previously measured 16 x 10 mm on the prior examination from December 2023 and today estimated at 14 x 13 mm on series 5, image 42. Upper Abdomen: The adrenal glands are preserved in the upper abdomen. Fatty liver infiltration. Previous cholecystectomy. Musculoskeletal: Mild degenerative changes of the spine with some curvature. IMPRESSION: Slight increase in focal opacity involving the superior segment right lower lobe with increasing pleural thickening and loculated small pleural fluid. The previously seen hypermetabolic nodules in the right upper lobe and lower lobe separately are again noted today in our appear relatively similar when adjusting for technique. Persistent soft tissue fullness of the right lung hilum as well as some prominent nodes in the mediastinum and left hilum. Underlying advanced emphysematous lung changes. Aortic Atherosclerosis (ICD10-I70.0) and Emphysema (ICD10-J43.9). Electronically Signed   By: Karen Kays M.D.   On: 01/27/2023 17:42    ASSESSMENT AND PLAN: This is a very pleasant 67  years old  white female diagnosed with unresectable stage IIb (T2 a, N1, M0) non-small cell lung cancer, squamous cell carcinoma with right lower lobe lung mass in addition to right hilar lymphadenopathy in March 2023. The patient has PD-L1 expression of 99%. She completed a course of concurrent chemoradiation with weekly carboplatin for AUC of 2 and paclitaxel 45 Mg/M2 status post 7 cycles.  Last dose was given on 02/27/2022. The patient is currently on observation and she is feeling fine except for the baseline shortness of breath and fatigue. She had repeat CT scan of the chest performed recently.  I personally and independently reviewed the scan images and discussed the result with the patient and her husband. Her previous CT scan in December 2023 showed finding compatible with a combination of emphysema and interstitial lung disease which has been progressive.  She also had evolving postradiation changes in the right lung and the treated nodule in the superior segment of the right lower lobe appeared grossly similar but there was new nodules within the right lung most concerning of which is in the right upper lobe and currently measures 1.9 x 1.3 cm requiring close monitoring.  She had a PET scan performed in early January 2024 and it showed no concerning findings for disease progression in the area of concern showed moderate metabolic activity that are nonspecific and may be related to infectious or inflammatory process but neoplasm could not be completely excluded. The patient is currently on observation and feeling fine except for the baseline shortness of breath secondary to COPD and interstitial lung disease as well as the radiation induced pneumonitis. I recommended for the patient to continue on observation with repeat CT scan of the chest in 3 months. She was advised to call immediately if she has any concerning symptoms in the interval. The patient voices understanding of current disease status and  treatment options and is in agreement with the current care plan.  All questions were answered. The patient knows to call the clinic with any problems, questions or concerns. We can certainly see the patient much sooner if necessary.  The total time spent in the appointment was 20 minutes.  Disclaimer: This note was dictated with voice recognition software. Similar sounding words can inadvertently be transcribed and may not be corrected upon review.

## 2023-01-30 ENCOUNTER — Encounter (HOSPITAL_COMMUNITY)
Admission: RE | Admit: 2023-01-30 | Discharge: 2023-01-30 | Disposition: A | Discharge status: Home / Self Care | Payer: Medicare Other | Source: Ambulatory Visit | Attending: Internal Medicine | Admitting: Internal Medicine

## 2023-01-30 ENCOUNTER — Encounter (HOSPITAL_COMMUNITY): Payer: Self-pay

## 2023-01-30 VITALS — Wt 143.3 lb

## 2023-01-30 DIAGNOSIS — J449 Chronic obstructive pulmonary disease, unspecified: Secondary | ICD-10-CM

## 2023-01-30 DIAGNOSIS — J849 Interstitial pulmonary disease, unspecified: Secondary | ICD-10-CM

## 2023-01-30 DIAGNOSIS — J479 Bronchiectasis, uncomplicated: Secondary | ICD-10-CM

## 2023-01-30 NOTE — Progress Notes (Signed)
Daily Session Note  Patient Details  Name: Diane [name] MRN: [id number] Date of Birth: 11-23-1955 Referring Provider:   Doristine Devoid Pulmonary Rehab Walk Test from 01/12/2023 in [facility]  Referring Provider Ramaswamy       Encounter Date: 01/30/2023  Check In:  Session Check In - 01/30/23 1550       Check-In   Supervising physician immediately available to respond to emergencies [facility] MD immediately available    Physician(s) Jari Favre, PA    Location [facility]    Staff Present Essie Hart, RN, Doris Cheadle, MS, ACSM-CEP, Exercise Physiologist;Randi Dionisio Paschal, ACSM-CEP, Exercise Physiologist;Casey Katrinka Blazing, RT    Virtual Visit No    Medication changes reported     No    Fall or balance concerns reported    No    Tobacco Cessation No Change    Warm-up and Cool-down Performed as group-led instruction    Resistance Training Performed Yes    VAD Patient? No    PAD/SET Patient? No      Pain Assessment   Currently in Pain? No/denies    Multiple Pain Sites No             Capillary Blood Glucose: No results found for this or any previous visit (from the past 24 hour(s)).   Exercise Prescription Changes - 01/30/23 1500       Response to Exercise   Blood Pressure (Admit) 120/60    Blood Pressure (Exercise) 124/66    Blood Pressure (Exit) 118/52    Heart Rate (Admit) 81 bpm    Heart Rate (Exercise) 107 bpm    Heart Rate (Exit) 96 bpm    Oxygen Saturation (Admit) 99 %    Oxygen Saturation (Exercise) 88 %    Oxygen Saturation (Exit) 91 %    Rating of Perceived Exertion (Exercise) 12    Perceived Dyspnea (Exercise) 1    Duration Continue with 30 min of aerobic exercise without signs/symptoms of physical distress.    Intensity THRR unchanged      Resistance Training   Training Prescription Yes    Weight red bands    Reps 10-15    Time 10 Minutes      Oxygen   Oxygen Continuous     Liters 10      Treadmill   MPH 2.4    Grade 1    Minutes 15    METs 3.17      Recumbant Elliptical   Level 2    Minutes 15    METs 4.6      Oxygen   Maintain Oxygen Saturation 88% or higher             Social History   Tobacco Use  Smoking Status Former   Packs/day: 0.50   Years: 30.00   Additional pack years: 0.00   Total pack years: 15.00   Types: Cigarettes   Quit date: 10/09/2021   Years since quitting: 1.3  Smokeless Tobacco Never  Tobacco Comments   5-10 cigarettes a day (plans to quit prior to this surgery 05/06/15)    Goals Met:  Independence with exercise equipment Improved SOB with ADL's Exercise tolerated well No report of concerns or symptoms today Strength training completed today  Goals Unmet:  Not Applicable  Comments: Service time is from 1326 to 1455    Dr. Mechele Collin is Medical Director for Pulmonary Rehab at [facility].

## 2023-02-01 ENCOUNTER — Encounter (HOSPITAL_COMMUNITY)
Admission: RE | Admit: 2023-02-01 | Discharge: 2023-02-01 | Disposition: A | Discharge status: Home / Self Care | Payer: Medicare Other | Source: Ambulatory Visit | Attending: Internal Medicine | Admitting: Internal Medicine

## 2023-02-01 DIAGNOSIS — J849 Interstitial pulmonary disease, unspecified: Secondary | ICD-10-CM

## 2023-02-01 NOTE — Progress Notes (Signed)
Daily Session Note  Patient Details  Name: Diane [name] MRN: [id number] Date of Birth: 11/05/1955 Referring Provider:   Doristine Devoid Pulmonary Rehab Walk Test from 01/12/2023 in [facility]  Referring Provider Ramaswamy       Encounter Date: 02/01/2023  Check In:  Session Check In - 02/01/23 1453       Check-In   Supervising physician immediately available to respond to emergencies [facility] MD immediately available    Physician(s) Eligha Bridegroom, NP    [facility]    Staff Present Samantha Belarus, RD, Dutch Gray, RN, Doris Cheadle, MS, ACSM-CEP, Exercise Physiologist;Casey Erin Sons BS, ACSM-CEP, Exercise Physiologist    Virtual Visit No    Medication changes reported     No    Fall or balance concerns reported    No    Tobacco Cessation No Change    Warm-up and Cool-down Performed as group-led instruction    Resistance Training Performed Yes    VAD Patient? No    PAD/SET Patient? No      Pain Assessment   Currently in Pain? No/denies    Multiple Pain Sites No             Capillary Blood Glucose: No results found for this or any previous visit (from the past 24 hour(s)).    Social History   Tobacco Use  Smoking Status Former   Packs/day: 0.50   Years: 30.00   Additional pack years: 0.00   Total pack years: 15.00   Types: Cigarettes   Quit date: 10/09/2021   Years since quitting: 1.3  Smokeless Tobacco Never  Tobacco Comments   5-10 cigarettes a day (plans to quit prior to this surgery 05/06/15)    Goals Met:  Proper associated with RPD/PD & O2 Sat Exercise tolerated well No report of concerns or symptoms today Strength training completed today  Goals Unmet:  Not Applicable  Comments: Service time is from 1319 to 1501.    Dr. Mechele Collin is Medical Director for Pulmonary Rehab at [facility].

## 2023-02-06 ENCOUNTER — Ambulatory Visit (INDEPENDENT_AMBULATORY_CARE_PROVIDER_SITE_OTHER): Payer: Medicare Other | Admitting: Acute Care

## 2023-02-06 ENCOUNTER — Encounter (HOSPITAL_COMMUNITY)
Admission: RE | Admit: 2023-02-06 | Discharge: 2023-02-06 | Disposition: A | Discharge status: Home / Self Care | Payer: Medicare Other | Source: Ambulatory Visit | Attending: Internal Medicine | Admitting: Internal Medicine

## 2023-02-06 ENCOUNTER — Encounter: Payer: Self-pay | Admitting: Acute Care

## 2023-02-06 VITALS — BP 118/64 | HR 88 | Ht 66.0 in | Wt 140.0 lb

## 2023-02-06 DIAGNOSIS — J849 Interstitial pulmonary disease, unspecified: Secondary | ICD-10-CM

## 2023-02-06 DIAGNOSIS — Z85118 Personal history of other malignant neoplasm of bronchus and lung: Secondary | ICD-10-CM

## 2023-02-06 DIAGNOSIS — J700 Acute pulmonary manifestations due to radiation: Secondary | ICD-10-CM | POA: Diagnosis not present

## 2023-02-06 DIAGNOSIS — R9389 Abnormal findings on diagnostic imaging of other specified body structures: Secondary | ICD-10-CM

## 2023-02-06 DIAGNOSIS — J441 Chronic obstructive pulmonary disease with (acute) exacerbation: Secondary | ICD-10-CM

## 2023-02-06 DIAGNOSIS — J9621 Acute and chronic respiratory failure with hypoxia: Secondary | ICD-10-CM

## 2023-02-06 MED ORDER — BREZTRI AEROSPHERE 160-9-4.8 MCG/ACT IN AERO: 2.0000 | INHALATION_SPRAY | Freq: Two times a day (BID) | RESPIRATORY_TRACT | 0 refills | Status: DC

## 2023-02-06 MED ORDER — ALBUTEROL SULFATE HFA 108 (90 BASE) MCG/ACT IN AERS
2.0000 | INHALATION_SPRAY | Freq: Four times a day (QID) | RESPIRATORY_TRACT | 2 refills | Status: DC | PRN
Start: 2023-02-06 — End: 2024-03-27

## 2023-02-06 NOTE — Progress Notes (Unsigned)
History of Present Illness Diane [name] is a 67 y.o. female with former smoker diagnosed with unresectable stage IIb (T2 a, N1, M0) non-small cell lung cancer, squamous cell carcinoma with right lower lobe lung mass in addition to right hilar lymphadenopathy in March 2023.She underwent concurrent chemo-radiation, last dosing 02/27/2022. She is undergoing CT surveillance with Dr. Arbutus Ped in Medical oncology. She is followed by pulmonary for her COPD, bronchiectasis, ILD , emphysema and radiation pneumonitis.  She is followed by Dr. Tonia Brooms and  by  Dr. Marchelle Gearing 01/2023>> Current oxygen needs are 4 L at rest and 6 L with exertion   Maintenance Anoro>> Therapeutic Trial of Breztri 02/06/2023 Rescue Albuterol nebs and inhaler Home Oxygen>>Patient Saturations on Room Air at Rest = 97% Patient Saturations on Room Air while Ambulating = 85%  Patient Saturations on 2 Liters of pulsed oxygen while Ambulating = 93% 12/13/2022>> Larger capacity concentrator ordered from DME 12/2022 Medalion oximeter ordered from DME, ordered again 01/2023 as patient has not yet received it OSA on CPAP with oxygen at 2 L bled in>> Unable to tolerate CPAP so she has stopped therapy as of 01/2023.   Cancer History DIAGNOSIS: Unresectable stage IIB (T2a, N1, M0) non-small cell lung cancer, squamous cell carcinoma presented with right lower lobe lung mass in addition to right hilar lymphadenopathy diagnosed in March 2023.     The patient had PD-L1 expression of 99%.   PRIOR THERAPY: A course of concurrent chemoradiation with weekly carboplatin for AUC of 2 and paclitaxel 45 Mg/M2.  First dose started 01/16/2022.  Status post 7 cycles.  Last dose was given on 02/27/2022.   CURRENT THERAPY: Observation. - scan showed no concerning findings for disease progression in the area of concern showed moderate metabolic activity that are nonspecific and may be related to infectious or inflammatory process but neoplasm could not be  completely excluded. Plan is for the patient to continue on observation with repeat CT scan of the chest in 3 months for restaging of her disease.Last surveillance scan done 01/25/2023 showed fibrosis seems stable, however there is a persistent soft tissue fullness of the right lung hilum as well as some prominent nodes in the mediastinum and left hilum, as well as .   02/06/2023 Pt. Presents for follow up 6 weeks after seeing Dr. Marchelle Gearing. She was started on Esbriet 10/31/2022.She initially was  Unable tolerate 3 tabs TID ( vomiting and constant  nausea). She dropped the dose in March to 2 tabs TID, and then tried to increase to 3 tabs daily again and was nauseated with weight loss of 5 pounds in 1 week. She dropped her dose back to 2 tablets TID  on  02/04/2023  and she is able to tolerate this dose. She is tolerating the lower dose well, already feels better after 2 days on the lower dose. She has been able to tolerate 2 tablets three times daily.We will hold at this dose for now until she sees Dr. Marchelle Gearing again in 6 weeks. LFT's drawn at the cancer center 01/25/2023 show  AST of 16, ALT if 11, Alk Phos of 64 and total bili of 0.3, so all WNL.She states she is no longer drinking wine , which is most likely helping maintain normal hepatic labs. Her CBC did show some anemia. HGB of 11.3 with a WBC of 7.5. This is about a  1.3 gram drop over previous HGB which was 12.6 four months ago. She has been started on Iron by Dr. Arbutus Ped, and  she has follow up labs placed by Dr.Mohamed  Amyla looks great today. No fever, no change in secretions, no change in shortness of breath. She feels she is at her baseline. Current oxygen use is 4L [place] at rest and 6 L with exertion. She is wearing her oxygen at all times, and she maintains saturations > 92%. She is not using her CPAP with everything she has going on but she is wearing her oxygen at bedtime. She does have a POC that her husband  purchased without insurance , and  she uses this when driving in the car, as it meets her at rest oxygen needs.  She is doing well at pulmonary rehab. She comes to the [place] rehab office and she feels she is getting a tremendous benefit from this. She is also meeting other ILD patient's and gets comfort and support from them.   There were some changes on her surveillance CT Chest  01/27/2023. Her previous CT scan in December 2023 showed finding compatible with a combination of emphysema and interstitial lung disease which has been progressive.  She also had evolving postradiation changes in the right lung and the treated nodule in the superior segment of the right lower lobe appeared grossly similar but there were new nodules within the right lung most concerning of which is in the right upper lobe and currently measures 1.9 x 1.3 cm requiring close monitoring.  She had a PET scan performed in early January 2024 and it showed no concerning findings for disease progression,  in the area of concern showed moderate metabolic activity that are nonspecific and may be related to infectious or inflammatory process but neoplasm could not be completely excluded. She has follow up CT ordered for 3 months which will be July 2024. She knows to call immediately for any concerning symptoms or changes in her breathing.   We are doing a therapeutic trial with Breztri. She is currently on Anoro which she states does not seem to help with her breathing. We discussed that there is a steroid component to the [place] which can increase risk of pneumonia. She knows to stop using inhaler and call to be seen if she has any changes in her breathing status. I have also prescribed an albuterol inhaler as she does have episodes of what sounds like upper airway wheezing.   We need to continue to monitor for weight loss. On the Esbriet 3 tabs TID she lost a significant amount of weight. We will re-evaluate her weight upon follow up with Dr. Marchelle Gearing in 6 weeks on the  2 tablets TID.  She will need repeat LFT's in 4-6 weeks , She sees Dr. Elly Modena 03/22/2023.      Test Results: CT Chest with contrast 01/27/2023 Slight increase in focal opacity involving the superior segment right lower lobe with increasing pleural thickening and loculated small pleural fluid.   The previously seen hypermetabolic nodules in the right upper lobe and lower lobe separately are again noted today in our appear relatively similar when adjusting for technique.   Persistent soft tissue fullness of the right lung hilum as well as some prominent nodes in the mediastinum and left hilum.   Underlying advanced emphysematous lung changes.   Aortic Atherosclerosis (ICD10-I70.0) and Emphysema (ICD10-J43.9).     Latest Ref Rng & Units 01/25/2023    9:55 AM 12/26/2022    4:20 PM 09/26/2022    9:53 AM  Hepatic Function  Total Protein 6.5 - 8.1 g/dL 8.1  7.9  7.8   Albumin 3.5 - 5.0 g/dL 4.1  3.9  4.0   AST 15 - 41 U/L 16  17  20    ALT 0 - 44 U/L 11  14  13    Alk Phosphatase 38 - 126 U/L 64  64  72   Total Bilirubin 0.3 - 1.2 mg/dL 0.3  0.2  0.4   Bilirubin, Direct 0.0 - 0.3 mg/dL  0.1            Latest Ref Rng & Units 01/25/2023    9:55 AM 09/26/2022    9:53 AM 06/27/2022   10:08 AM  CBC  WBC 4.0 - 10.5 K/uL 7.5  6.4  6.0   Hemoglobin 12.0 - 15.0 g/dL 16.1  09.6  04.5   Hematocrit 36.0 - 46.0 % 33.3  36.8  39.4   Platelets 150 - 400 K/uL 296  303  303        Latest Ref Rng & Units 01/25/2023    9:55 AM 09/26/2022    9:53 AM 06/27/2022   10:08 AM  BMP  Glucose 70 - 99 mg/dL 96  99  409   BUN 8 - 23 mg/dL 16  12  16    Creatinine 0.44 - 1.00 mg/dL 8.11  9.14  7.82   Sodium 135 - 145 mmol/L 140  137  140   Potassium 3.5 - 5.1 mmol/L 4.1  4.1  4.1   Chloride 98 - 111 mmol/L 102  102  102   CO2 22 - 32 mmol/L 32  28  31   Calcium 8.9 - 10.3 mg/dL 9.8  9.8  95.6     BNP No results found for: "BNP"  ProBNP    Component Value Date/Time   PROBNP 55.0 11/13/2022  1138    PFT    Component Value Date/Time   FEV1PRE 1.74 12/26/2022 1353   FEV1POST 1.70 12/26/2022 1353   FVCPRE 2.10 12/26/2022 1353   FVCPOST 2.11 12/26/2022 1353   TLC 3.68 12/26/2022 1353   DLCOUNC 6.99 12/26/2022 1353   PREFEV1FVCRT 83 12/26/2022 1353   PSTFEV1FVCRT 81 12/26/2022 1353    CT Chest W Contrast  Result Date: 01/27/2023 CLINICAL DATA:  Non-small-cell lung cancer staging. * Tracking Code: BO * EXAM: CT CHEST WITH CONTRAST TECHNIQUE: Multidetector CT imaging of the chest was performed during intravenous contrast administration. RADIATION DOSE REDUCTION: This exam was performed according to the departmental dose-optimization program which includes automated exposure control, adjustment of the mA and/or kV according to patient size and/or use of iterative reconstruction technique. CONTRAST:  75mL OMNIPAQUE IOHEXOL 300 MG/ML  SOLN COMPARISON:  PET-CT 10/12/2022.  Chest CT 09/26/2022 and older FINDINGS: Cardiovascular: Heart is nonenlarged. No pericardial effusion. The thoracic aorta has a normal course and caliber with scattered atherosclerotic calcified plaque. There is a bovine type aortic arch, a normal variant. There is some enlargement of the main pulmonary arteries. Please correlate for any evidence of pulmonary artery hypertension. Coronary artery calcifications are seen. Please correlate for other coronary risk factors. Mediastinum/Nodes: Small hiatal hernia. Stable thyroid gland. No specific abnormal lymph node enlargement present in the axillary regions. There are some prominent hilar nodes identified. Example on the left on series 2, image 69 measures 14 by 9 mm today. Going back to a contrast study of September 2023 this same node would have measured 14 by 9 mm, similar. Fullness seen in the right lung hilum is also likely related to some small nodes and is less well defined.  Areas increased from September 2023 and similar to more recent studies. There also some prominent  mediastinal nodes. Example right paratracheal series 2, image 40 today measures 12 by 10 mm and previously 14 x 8 mm, similar. Other nodes in the mediastinum also are similar going back to the exam. These are also similar to the recent PET-CT of December 2023. At that time these nodes were not hypermetabolic. Lungs/Pleura: Scattered emphysematous changes are seen with paraseptal and centrilobular changes. There also components of fibrosis and honeycombing and traction bronchiectasis. No left-sided pleural effusion, consolidation. Areas of patchy ground-glass identified in the left hemithorax such as posterior left upper lobe on series 5, image 31 which is new from the study of December 2023. Is also increasing areas in the superior segment of the left lower lobe. Right lung has areas of pleural thickening some loculated fluid at the level of the superior segment of the right lower lobe which is increased from the prior PET-CT scan. The associated opacity in this location is also increasing and appears somewhat nodular as best seen on series 2, image 66 going back to the older study from September 2023. Previous discrete nodule in this location which is less appreciated. Likely obscured by the adjacent opacity. Separately there is focal spiculated nodule just above the diaphragm in the right lower lobe on series 5, image 91 measuring 12 by 10 mm. On the prior PET-CT this measured 13 x 8 mm and does show abnormal uptake. There is also a hypermetabolic nodule in the right upper lobe which previously measured 16 x 10 mm on the prior examination from December 2023 and today estimated at 14 x 13 mm on series 5, image 42. Upper Abdomen: The adrenal glands are preserved in the upper abdomen. Fatty liver infiltration. Previous cholecystectomy. Musculoskeletal: Mild degenerative changes of the spine with some curvature. IMPRESSION: Slight increase in focal opacity involving the superior segment right lower lobe with  increasing pleural thickening and loculated small pleural fluid. The previously seen hypermetabolic nodules in the right upper lobe and lower lobe separately are again noted today in our appear relatively similar when adjusting for technique. Persistent soft tissue fullness of the right lung hilum as well as some prominent nodes in the mediastinum and left hilum. Underlying advanced emphysematous lung changes. Aortic Atherosclerosis (ICD10-I70.0) and Emphysema (ICD10-J43.9). Electronically Signed   By: Karen Kays M.D.   On: 01/27/2023 17:42     Past medical hx Past Medical History:  Diagnosis Date   Anxiety    Arthritis    fingers, left foot   C. difficile diarrhea 2015   COPD (chronic obstructive pulmonary disease)    Depression    Eczema    H pylori ulcer    Headache(784.0)    Hypercholesterolemia    Hypertension    Kidney stones    20 years ago   Pneumonia    5 years ago   Seizures    2 years ago, "cluster of seizures" none since   Shingles      Social History   Tobacco Use   Smoking status: Former    Packs/day: 0.50    Years: 30.00    Additional pack years: 0.00    Total pack years: 15.00    Types: Cigarettes    Quit date: 10/09/2021    Years since quitting: 1.3   Smokeless tobacco: Never   Tobacco comments:    5-10 cigarettes a day (plans to quit prior to this surgery 05/06/15)  Vaping  Use   Vaping Use: Some days  Substance Use Topics   Alcohol use: Yes    Alcohol/week: 3.0 standard drinks of alcohol    Types: 3 Shots of liquor per week    Comment: 2x week   Drug use: No    Ms.Pasquarello reports that she quit smoking about 15 months ago. Her smoking use included cigarettes. She has a 15.00 pack-year smoking history. She has never used smokeless tobacco. She reports current alcohol use of about 3.0 standard drinks of alcohol per week. She reports that she does not use drugs.  Tobacco Cessation: Former smoker quit 10/09/2021  Past surgical hx, Family hx,  Social hx all reviewed.  Current Outpatient Medications on File Prior to Visit  Medication Sig   albuterol (PROVENTIL) (5 MG/ML) 0.5% nebulizer solution Take 2.5 mg by nebulization every 6 (six) hours as needed for wheezing or shortness of breath.   ALPRAZolam (XANAX) 0.5 MG tablet Take 0.5 mg by mouth daily as needed.   cetirizine (ZYRTEC) 10 MG tablet Take 10 mg by mouth at bedtime.   EPINEPHrine (AUVI-Q) 0.3 mg/0.3 mL IJ SOAJ injection Inject 0.3 mLs (0.3 mg total) into the muscle as needed for anaphylaxis.   fluticasone (FLONASE) 50 MCG/ACT nasal spray Place 2 sprays into both nostrils daily. (Patient taking differently: Place 2 sprays into both nostrils daily as needed for allergies.)   hydrochlorothiazide (MICROZIDE) 12.5 MG capsule Take 12.5 mg by mouth daily.   omeprazole (PRILOSEC) 20 MG capsule Take 1 capsule (20 mg total) by mouth 2 (two) times daily. (Needs to be seen before next refill)   OVER THE COUNTER MEDICATION Take 1 capsule by mouth daily. Probiotic   Pirfenidone (ESBRIET) 267 MG TABS Take 3 tablets (801 mg total) by mouth 3 (three) times daily with meals. (Account: [email or web address])   rosuvastatin (CRESTOR) 10 MG tablet Take 10 mg by mouth at bedtime.   umeclidinium-vilanterol (ANORO ELLIPTA) 62.5-25 MCG/ACT AEPB Inhale 1 puff into the lungs daily.   Pirfenidone 801 MG TABS Month 2 and onwards: take 1 tablet (801mg ) by mouth three times daily with meals. (Account: [email or web address]) (Patient not taking: Reported on 02/06/2023)   No current facility-administered medications on file prior to visit.     Allergies  Allergen Reactions   Alpha-Gal     Other reaction(s): Abdominal Pain   Other Other (See Comments)   Penicillins     Unknown type of reaction    Review Of Systems:  Constitutional:   + weight loss, No night sweats,  Fevers, chills, fatigue, or  lassitude.  HEENT:   No headaches,  Difficulty swallowing,  Tooth/dental problems, or   Sore throat,                No sneezing, itching, ear ache, nasal congestion, post nasal drip,   CV:  No chest pain,  Orthopnea, PND, swelling in lower extremities, anasarca, dizziness, palpitations, syncope.   GI  No heartburn, indigestion, abdominal pain, nausea, vomiting, diarrhea, change in bowel habits, loss of appetite, bloody stools.   Resp: + baseline  shortness of breath with exertion or at rest.  No excess mucus, no productive cough,  No non-productive cough,  No coughing up of blood.  No change in color of mucus.  +  wheezing.  No chest wall deformity  Skin: no rash or lesions.  GU: no dysuria, change in color of urine, no urgency or frequency.  No flank pain, no hematuria   MS:  No joint pain or  swelling.  No decreased range of motion.  No back pain.  Psych:  No change in mood or affect. No depression or anxiety.  No memory loss.   Vital Signs BP 118/64   Pulse 88   Ht 5\' 6"  (1.676 m)   Wt 140 lb (63.5 kg)   SpO2 98% Comment: on 5L  BMI 22.60 kg/m    Physical Exam:  General- No distress,  A&Ox3, pleasant ENT: No sinus tenderness, TM clear, pale nasal mucosa, no oral exudate,no post nasal drip, no LAN Cardiac: S1, S2, regular rate and rhythm, no murmur Chest: + upper airway  wheeze/ No rales/ + crackles bilateral lung bases to mid way up,No dullness; no accessory muscle use, no nasal flaring, no sternal retractions Abd.: Soft Non-tender, ND, BS +, Body mass index is 22.6 kg/m.  Ext: No clubbing cyanosis, edema Neuro:  normal strength, MAE x 3, A&O x 3, appropriate Skin: No rashes, warm and dry, no lesions  Psych: normal mood and behavior   Assessment/Plan Unresectable stage IIb (T2 a, N1, M0) non-small cell lung cancer, squamous cell carcinoma with right lower lobe lung mass in addition to right hilar lymphadenopathy in March 2023 Concurrent chemo-radiation, last dosing 02/27/2022 Radiation pneumonitis and evolving post radiation changes ILD on Esbriet>> 2 tabs  TID, unable to tolerate 3 tabs TID Weight loss 2/2 medication >> titrating medication down to highest tolerated dose Chronic Respiratory Failure on oxygen COPD Emphysema Bronchiectasis Plan Continue Esbriet 2 tablets three times daily, as you can tolerate this dose . Liver function tests ( 01/25/2023) look normal on Esbriet which is good news  Monitor your weight, if you continue to lose weight, please call to be seen.  We will do a trial of Breztri to see if this helps your breathing. Take 2 puffs twice daily  Rinse mouth after use.  Hold Anoro while on [place] I have also ordered an albuterol inhaler. Use this as needed every 6 hours for shortness of breath or wheezing. Continue Oxygen at 4 L at rest and 6L with exertion Continue night time oxygen Follow up with Dr. Marchelle Gearing 03/22/2023 You will need repeat liver function tests at that time as therapeutic monitoring of you Esbriet.  Pt. Needs a letter of necessity for oxygen on airlines and for [employer].  Letter for Airline travel provided. She will bring the [employer] form to complete. Call for any change in condition immediately. Call for fever , or seek emergency care. Please contact office for sooner follow up if symptoms do not improve or worsen or seek emergency care    I spent 45 minutes dedicated to the care of this patient on the date of this encounter to include pre-visit review of records, face-to-face time with the patient discussing conditions above, post visit ordering of testing, clinical documentation with the electronic health record, making appropriate referrals as documented, and communicating necessary information to the patient's healthcare team.    Bevelyn Ngo, NP 02/06/2023  11:59 AM

## 2023-02-06 NOTE — Progress Notes (Signed)
Daily Session Note  Patient Details  Name: Diane [name] MRN: [id number] Date of Birth: 1956-07-19 Referring Provider:   Doristine Devoid Pulmonary Rehab Walk Test from 01/12/2023 in [facility]  Referring Provider Ramaswamy       Encounter Date: 02/06/2023  Check In:  Session Check In - 02/06/23 1525       Check-In   Supervising physician immediately available to respond to emergencies [facility] MD immediately available    Physician(s) Eligha Bridegroom, NP    Location [facility]    Staff Present Samantha Belarus, RD, Dutch Gray, RN, Doris Cheadle, MS, ACSM-CEP, Exercise Physiologist;Casey Erin Sons BS, ACSM-CEP, Exercise Physiologist    Virtual Visit No    Medication changes reported     No    Fall or balance concerns reported    No    Tobacco Cessation No Change    Warm-up and Cool-down Performed as group-led instruction    Resistance Training Performed Yes    VAD Patient? No    PAD/SET Patient? No      Pain Assessment   Currently in Pain? No/denies    Multiple Pain Sites No             Capillary Blood Glucose: No results found for this or any previous visit (from the past 24 hour(s)).    Social History   Tobacco Use  Smoking Status Former   Packs/day: 0.50   Years: 30.00   Additional pack years: 0.00   Total pack years: 15.00   Types: Cigarettes   Quit date: 10/09/2021   Years since quitting: 1.3  Smokeless Tobacco Never  Tobacco Comments   5-10 cigarettes a day (plans to quit prior to this surgery 05/06/15)    Goals Met:  Proper associated with RPD/PD & O2 Sat Independence with exercise equipment Exercise tolerated well No report of concerns or symptoms today Strength training completed today  Goals Unmet:  Not Applicable  Comments: Service time is from 1318 to 1455.    Dr. Mechele Collin is Medical Director for Pulmonary Rehab at [facility].

## 2023-02-06 NOTE — Patient Instructions (Addendum)
It is good to see you today. Continue Esbriet 2 tablets three times daily, as you can tolerate this dose . Liver function tests ( 01/25/2023) look normal on Esbriet which is good news  We will do a trial of Breztri to see if this helps your breathing. Take 2 puffs twice daily  Rinse mouth after use.  Hold Anoro while on [place] I have also ordered an albuterol inhaler. Use this as needed every 6 hours for shortness of breath or wheezing. Follow up with Dr. Marchelle Gearing 03/22/2023 Pt. Needs a letter of necessity for oxygen on airlines and for [employer].  Call for any change in condition immediately. Please contact office for sooner follow up if symptoms do not improve or worsen or seek emergency care

## 2023-02-07 ENCOUNTER — Encounter: Payer: Self-pay | Admitting: Acute Care

## 2023-02-07 ENCOUNTER — Telehealth: Payer: Self-pay | Admitting: Internal Medicine

## 2023-02-08 ENCOUNTER — Encounter (HOSPITAL_COMMUNITY): Payer: Medicare Other

## 2023-02-13 ENCOUNTER — Encounter (HOSPITAL_COMMUNITY)
Admission: RE | Admit: 2023-02-13 | Discharge: 2023-02-13 | Disposition: A | Discharge status: Home / Self Care | Payer: Medicare Other | Source: Ambulatory Visit | Attending: Internal Medicine | Admitting: Internal Medicine

## 2023-02-13 VITALS — Wt 142.4 lb

## 2023-02-13 DIAGNOSIS — J849 Interstitial pulmonary disease, unspecified: Secondary | ICD-10-CM

## 2023-02-15 ENCOUNTER — Encounter (HOSPITAL_COMMUNITY)
Admission: RE | Admit: 2023-02-15 | Discharge: 2023-02-15 | Disposition: A | Discharge status: Home / Self Care | Payer: Medicare Other | Source: Ambulatory Visit | Attending: Internal Medicine | Admitting: Internal Medicine

## 2023-02-15 DIAGNOSIS — J479 Bronchiectasis, uncomplicated: Secondary | ICD-10-CM | POA: Diagnosis present

## 2023-02-15 DIAGNOSIS — J449 Chronic obstructive pulmonary disease, unspecified: Secondary | ICD-10-CM | POA: Insufficient documentation

## 2023-02-15 DIAGNOSIS — J849 Interstitial pulmonary disease, unspecified: Secondary | ICD-10-CM | POA: Insufficient documentation

## 2023-02-15 NOTE — Progress Notes (Signed)
Daily Session Note  Patient Details  Name: Diane [name] MRN: [id number] Date of Birth: [birth year] Referring Provider:   Doristine Devoid Pulmonary Rehab Walk Test from 01/12/2023 in [facility]  Referring Provider Ramaswamy       Encounter Date: 02/15/2023  Check In:  Session Check In - 02/15/23 1551       Check-In   Supervising physician immediately available to respond to emergencies [facility] MD immediately available    Physician(s) Edd Fabian, NP    Location [facility]    Staff Present Essie Hart, RN, Doris Cheadle, MS, ACSM-CEP, Exercise Physiologist;Casey Erin Sons BS, ACSM-CEP, Exercise Physiologist    Virtual Visit No    Medication changes reported     No    Fall or balance concerns reported    No    Tobacco Cessation No Change    Warm-up and Cool-down Performed as group-led instruction    Resistance Training Performed Yes    VAD Patient? No    PAD/SET Patient? No      Pain Assessment   Currently in Pain? No/denies    Multiple Pain Sites No             Capillary Blood Glucose: No results found for this or any previous visit (from the past 24 hour(s)).    Social History   Tobacco Use  Smoking Status Former   Packs/day: 0.50   Years: 30.00   Additional pack years: 0.00   Total pack years: 15.00   Types: Cigarettes   Quit date: 10/09/2021   Years since quitting: 1.3  Smokeless Tobacco Never  Tobacco Comments   5-10 cigarettes a day (plans to quit prior to this surgery 05/06/15)    Goals Met:  Independence with exercise equipment Improved SOB with ADL's Exercise tolerated well No report of concerns or symptoms today Strength training completed today  Goals Unmet:  Not Applicable  Comments: Service time is from 1328 to 1505    Dr. Mechele Collin is Medical Director for Pulmonary Rehab at [facility].

## 2023-02-20 ENCOUNTER — Encounter (HOSPITAL_COMMUNITY)
Admission: RE | Admit: 2023-02-20 | Discharge: 2023-02-20 | Disposition: A | Discharge status: Home / Self Care | Payer: Medicare Other | Source: Ambulatory Visit | Attending: Internal Medicine | Admitting: Internal Medicine

## 2023-02-20 DIAGNOSIS — J849 Interstitial pulmonary disease, unspecified: Secondary | ICD-10-CM

## 2023-02-20 NOTE — Progress Notes (Signed)
Daily Session Note  Patient Details  Name: Diane [name] MRN: [id number] Date of Birth: February 07, 1956 Referring Provider:   Doristine Devoid Pulmonary Rehab Walk Test from 01/12/2023 in [facility]  Referring Provider Ramaswamy       Encounter Date: 02/20/2023  Check In:  Session Check In - 02/20/23 1446       Check-In   Supervising physician immediately available to respond to emergencies [facility] MD immediately available    Physician(s) Carlos Levering, NP    Location [facility]    Staff Present Essie Hart, RN, Doris Cheadle, MS, ACSM-CEP, Exercise Physiologist;Casey Erin Sons BS, ACSM-CEP, Exercise Physiologist;Olinty Peggye Pitt, MS, ACSM-CEP, Exercise Physiologist;Carlette Les Pou, RN, BSN;Samantha Belarus, RD, LDN    Virtual Visit No    Medication changes reported     No    Fall or balance concerns reported    No    Tobacco Cessation No Change    Warm-up and Cool-down Performed as group-led instruction    Resistance Training Performed Yes    VAD Patient? No    PAD/SET Patient? No      Pain Assessment   Currently in Pain? No/denies    Multiple Pain Sites No             Capillary Blood Glucose: No results found for this or any previous visit (from the past 24 hour(s)).    Social History   Tobacco Use  Smoking Status Former   Packs/day: 0.50   Years: 30.00   Additional pack years: 0.00   Total pack years: 15.00   Types: Cigarettes   Quit date: 10/09/2021   Years since quitting: 1.3  Smokeless Tobacco Never  Tobacco Comments   5-10 cigarettes a day (plans to quit prior to this surgery 05/06/15)    Goals Met:  Independence with exercise equipment Exercise tolerated well No report of concerns or symptoms today Strength training completed today  Goals Unmet:  Not Applicable  Comments: Service time is from 1320 to 1450.    Dr. Mechele Collin is Medical Director  for Pulmonary Rehab at [facility].

## 2023-02-22 ENCOUNTER — Encounter (HOSPITAL_COMMUNITY)
Admission: RE | Admit: 2023-02-22 | Discharge: 2023-02-22 | Disposition: A | Discharge status: Home / Self Care | Payer: Medicare Other | Source: Ambulatory Visit | Attending: Internal Medicine | Admitting: Internal Medicine

## 2023-02-22 DIAGNOSIS — J849 Interstitial pulmonary disease, unspecified: Secondary | ICD-10-CM | POA: Diagnosis not present

## 2023-02-22 DIAGNOSIS — J449 Chronic obstructive pulmonary disease, unspecified: Secondary | ICD-10-CM

## 2023-02-22 DIAGNOSIS — J479 Bronchiectasis, uncomplicated: Secondary | ICD-10-CM

## 2023-02-22 NOTE — Progress Notes (Addendum)
Daily Session Note  Patient Details  Name: Diane [name] MRN: [id number] Date of Birth: 15-Apr-1956 Referring Provider:   Doristine Devoid Pulmonary Rehab Walk Test from 01/12/2023 in [facility]  Referring Provider Ramaswamy       Encounter Date: 02/22/2023  Check In:  Session Check In - 02/22/23 1442       Check-In   Supervising physician immediately available to respond to emergencies [facility] MD immediately available    Physician(s) Jari Favre, PA    Location [facility]    Staff Present Essie Hart, RN, Doris Cheadle, MS, ACSM-CEP, Exercise Physiologist;Casey Katrinka Blazing, RT;Randi Idelle Crouch BS, ACSM-CEP, Exercise Physiologist;Bailey Wallace Cullens, MS, Exercise Physiologist;David Manus Gunning, MS, ACSM-CEP, CCRP, Exercise Physiologist    Virtual Visit No    Medication changes reported     No    Fall or balance concerns reported    No    Tobacco Cessation No Change    Warm-up and Cool-down Performed as group-led instruction    Resistance Training Performed Yes    VAD Patient? No    PAD/SET Patient? No      Pain Assessment   Currently in Pain? No/denies    Multiple Pain Sites No             Capillary Blood Glucose: No results found for this or any previous visit (from the past 24 hour(s)).    Social History   Tobacco Use  Smoking Status Former   Packs/day: 0.50   Years: 30.00   Additional pack years: 0.00   Total pack years: 15.00   Types: Cigarettes   Quit date: 10/09/2021   Years since quitting: 1.3  Smokeless Tobacco Never  Tobacco Comments   5-10 cigarettes a day (plans to quit prior to this surgery 05/06/15)    Goals Met:  Independence with exercise equipment Improved SOB with ADL's Exercise tolerated well No report of concerns or symptoms today Strength training completed today  Goals Unmet:  Not Applicable  Comments: Service time is from 1311 to 1450    Dr. Mechele Collin is Medical  Director for Pulmonary Rehab at [facility].

## 2023-02-27 ENCOUNTER — Encounter (HOSPITAL_COMMUNITY)
Admission: RE | Admit: 2023-02-27 | Discharge: 2023-02-27 | Disposition: A | Discharge status: Home / Self Care | Payer: Medicare Other | Source: Ambulatory Visit | Attending: Internal Medicine | Admitting: Internal Medicine

## 2023-02-27 ENCOUNTER — Encounter (HOSPITAL_COMMUNITY): Payer: Self-pay

## 2023-02-27 VITALS — Wt 137.8 lb

## 2023-02-27 DIAGNOSIS — J849 Interstitial pulmonary disease, unspecified: Secondary | ICD-10-CM

## 2023-02-27 NOTE — Progress Notes (Signed)
Daily Session Note  Patient Details  Name: Diane [name] MRN: [id number] Date of Birth: Sep 23, 1956 Referring Provider:   Doristine Devoid Pulmonary Rehab Walk Test from 01/12/2023 in [facility]  Referring Provider Ramaswamy       Encounter Date: 02/27/2023  Check In:  Session Check In - 02/27/23 1420       Check-In   Supervising physician immediately available to respond to emergencies [facility] MD immediately available    Physician(s) Jari Favre, PA    Location [facility]    Staff Present Essie Hart, RN, Doris Cheadle, MS, ACSM-CEP, Exercise Physiologist;Casey Katrinka Blazing, RT;Randi Idelle Crouch BS, ACSM-CEP, Exercise Physiologist;David Manus Gunning, MS, ACSM-CEP, CCRP, Exercise Physiologist;Samantha Belarus, RD, LDN    Virtual Visit No    Medication changes reported     No    Fall or balance concerns reported    No    Tobacco Cessation No Change    Warm-up and Cool-down Performed as group-led instruction    Resistance Training Performed Yes    VAD Patient? No    PAD/SET Patient? No      Pain Assessment   Currently in Pain? No/denies    Pain Score 0-No pain    Multiple Pain Sites No             Capillary Blood Glucose: No results found for this or any previous visit (from the past 24 hour(s)).   Exercise Prescription Changes - 02/27/23 1500       Response to Exercise   Blood Pressure (Admit) 112/68    Blood Pressure (Exercise) 140/68    Blood Pressure (Exit) 96/60    Heart Rate (Admit) 93 bpm    Heart Rate (Exercise) 114 bpm    Heart Rate (Exit) 97 bpm    Oxygen Saturation (Admit) 99 %   on 6L   Oxygen Saturation (Exercise) 89 %   on 8L   Oxygen Saturation (Exit) 99 %   on 4L   Rating of Perceived Exertion (Exercise) 12    Perceived Dyspnea (Exercise) 2    Duration Continue with 30 min of aerobic exercise without signs/symptoms of physical distress.    Intensity THRR unchanged      Resistance  Training   Training Prescription Yes    Weight black bands    Reps 10-15    Time 10 Minutes      Oxygen   Oxygen Continuous    Liters 6-8L      Treadmill   MPH 2.5    Grade 2.5    Minutes 15    METs 3.78      Recumbant Elliptical   Level 3    Minutes 15    METs 3.9      Oxygen   Maintain Oxygen Saturation 88% or higher             Social History   Tobacco Use  Smoking Status Former   Packs/day: 0.50   Years: 30.00   Additional pack years: 0.00   Total pack years: 15.00   Types: Cigarettes   Quit date: 10/09/2021   Years since quitting: 1.3  Smokeless Tobacco Never  Tobacco Comments   5-10 cigarettes a day (plans to quit prior to this surgery 05/06/15)    Goals Met:  Independence with exercise equipment Improved SOB with ADL's Exercise tolerated well No report of concerns or symptoms today Strength training completed today  Goals Unmet:  Not Applicable  Comments: Service time is from 1315 to 1445    Dr. Mechele Collin is Medical Director for Pulmonary Rehab at [facility].

## 2023-02-28 NOTE — Progress Notes (Signed)
Pulmonary Individual Treatment Plan  Patient Details  Name: Diane [name] MRN: [id number] Date of Birth: 07/15/1956 Referring Provider:   Doristine Devoid Pulmonary Rehab Walk Test from 01/12/2023 in [facility]  Referring Provider Ramaswamy       Initial Encounter Date:  Flowsheet Row Pulmonary Rehab Walk Test from 01/12/2023 in [facility], [facility]  Date 01/12/23       Visit Diagnosis: ILD (interstitial lung disease) (HCC)  Patient's Home Medications on Admission:   Current Outpatient Medications:    albuterol (PROVENTIL) (5 MG/ML) 0.5% nebulizer solution, Take 2.5 mg by nebulization every 6 (six) hours as needed for wheezing or shortness of breath., Disp: , Rfl:    albuterol (VENTOLIN HFA) 108 (90 Base) MCG/ACT inhaler, Inhale 2 puffs into the lungs every 6 (six) hours as needed for wheezing or shortness of breath., Disp: 8 g, Rfl: 2   ALPRAZolam (XANAX) 0.5 MG tablet, Take 0.5 mg by mouth daily as needed., Disp: , Rfl:    Budeson-Glycopyrrol-Formoterol (BREZTRI AEROSPHERE) 160-9-4.8 MCG/ACT AERO, Inhale 2 puffs into the lungs in the morning and at bedtime., Disp: 2 each, Rfl: 0   cetirizine (ZYRTEC) 10 MG tablet, Take 10 mg by mouth at bedtime., Disp: , Rfl:    EPINEPHrine (AUVI-Q) 0.3 mg/0.3 mL IJ SOAJ injection, Inject 0.3 mLs (0.3 mg total) into the muscle as needed for anaphylaxis., Disp: 2 each, Rfl: 1   fluticasone (FLONASE) 50 MCG/ACT nasal spray, Place 2 sprays into both nostrils daily. (Patient taking differently: Place 2 sprays into both nostrils daily as needed for allergies.), Disp: 16 g, Rfl: 5   hydrochlorothiazide (MICROZIDE) 12.5 MG capsule, Take 12.5 mg by mouth daily., Disp: , Rfl:    omeprazole (PRILOSEC) 20 MG capsule, Take 1 capsule (20 mg total) by mouth 2 (two) times daily. (Needs to be seen before next refill), Disp: 60 capsule, Rfl: 0   OVER THE  COUNTER MEDICATION, Take 1 capsule by mouth daily. Probiotic, Disp: , Rfl:    Pirfenidone (ESBRIET) 267 MG TABS, Take 3 tablets (801 mg total) by mouth 3 (three) times daily with meals. (Account: [email or web address]), Disp: 270 tablet, Rfl: 4   rosuvastatin (CRESTOR) 10 MG tablet, Take 10 mg by mouth at bedtime., Disp: , Rfl:    umeclidinium-vilanterol (ANORO ELLIPTA) 62.5-25 MCG/ACT AEPB, Inhale 1 puff into the lungs daily., Disp: 60 each, Rfl: 3  Past Medical History: Past Medical History:  Diagnosis Date   Anxiety    Arthritis    fingers, left foot   C. difficile diarrhea 2015   COPD (chronic obstructive pulmonary disease) (HCC)    Depression    Eczema    H pylori ulcer    Headache(784.0)    Hypercholesterolemia    Hypertension    Kidney stones    20 years ago   Pneumonia    5 years ago   Seizures (HCC)    2 years ago, "cluster of seizures" none since   Shingles     Tobacco Use: Social History   Tobacco Use  Smoking Status Former   Packs/day: 0.50   Years: 30.00   Additional pack years: 0.00   Total pack years: 15.00   Types: Cigarettes   Quit date: 10/09/2021   Years since quitting: 1.3  Smokeless Tobacco Never  Tobacco Comments   5-10 cigarettes a day (plans to quit prior to this surgery 05/06/15)    Labs: Review  Flowsheet  More data exists      Latest Ref Rng & Units 01/11/2018 12/03/2019 01/06/2021 02/17/2021 10/14/2021  Labs for ITP Cardiac and Pulmonary Rehab  Cholestrol <200 mg/dL 409  811  914  782  956   LDL (calc) mg/dL (calc) 213  086  578  469  95   HDL-C > OR = 50 mg/dL 76  62  55  63  55   Trlycerides <150 mg/dL 629  528  413  244  010   Hemoglobin A1c <7.0 % 5.1  - - - -    Capillary Blood Glucose: Lab Results  Component Value Date   GLUCAP 96 10/12/2022   GLUCAP 100 (H) 12/13/2021     Pulmonary Assessment Scores:  Pulmonary Assessment Scores     Row Name 01/12/23 1436         ADL UCSD   SOB Score total 84       CAT  Score   CAT Score 30       mMRC Score   mMRC Score 3             UCSD: Self-administered rating of dyspnea associated with activities of daily living (ADLs) 6-point scale (0 = "not at all" to 5 = "maximal or unable to do because of breathlessness")  Scoring Scores range from 0 to 120.  Minimally important difference is 5 units  CAT: CAT can identify the health impairment of COPD patients and is better correlated with disease progression.  CAT has a scoring range of zero to 40. The CAT score is classified into four groups of low (less than 10), medium (10 - 20), high (21-30) and very high (31-40) based on the impact level of disease on health status. A CAT score over 10 suggests significant symptoms.  A worsening CAT score could be explained by an exacerbation, poor medication adherence, poor inhaler technique, or progression of COPD or comorbid conditions.  CAT MCID is 2 points  mMRC: mMRC (Modified Medical Research Council) Dyspnea Scale is used to assess the degree of baseline functional disability in patients of respiratory disease due to dyspnea. No minimal important difference is established. A decrease in score of 1 point or greater is considered a positive change.   Pulmonary Function Assessment:  Pulmonary Function Assessment - 01/12/23 1327       Breath   Bilateral Breath Sounds Decreased;Rhonchi    Shortness of Breath Yes;Fear of Shortness of Breath;Limiting activity             Exercise Target Goals: Exercise Program Goal: Individual exercise prescription set using results from initial 6 min walk test and THRR while considering  patient's activity barriers and safety.   Exercise Prescription Goal: Initial exercise prescription builds to 30-45 minutes a day of aerobic activity, 2-3 days per week.  Home exercise guidelines will be given to patient during program as part of exercise prescription that the participant will acknowledge.  Activity Barriers & Risk  Stratification:  Activity Barriers & Cardiac Risk Stratification - 01/12/23 1433       Activity Barriers & Cardiac Risk Stratification   Activity Barriers Deconditioning;Muscular Weakness;Shortness of Breath;Arthritis             6 Minute Walk:  6 Minute Walk     Row Name 01/12/23 1451         6 Minute Walk   Phase Initial     Distance 758 feet     Walk Time 6 minutes     #  of Rest Breaks 2     MPH 1.44     METS 2.56     RPE 11     Perceived Dyspnea  3     VO2 Peak 8.97     Symptoms Yes (comment)     Comments dyspnea, stopped patient for O2 sat <88% at 2:09, 2:41, and 5:25     Resting HR 93 bpm     Resting BP 104/64     Resting Oxygen Saturation  99 %     Exercise Oxygen Saturation  during 6 min walk 6 %     Max Ex. HR 119 bpm     Max Ex. BP 124/70     2 Minute Post BP 112/66       Interval HR   1 Minute HR 109     2 Minute HR 116     3 Minute HR 112     4 Minute HR 112     5 Minute HR 117     6 Minute HR 119     2 Minute Post HR 103     Interval Heart Rate? Yes       Interval Oxygen   Interval Oxygen? Yes     Baseline Oxygen Saturation % 99 %     1 Minute Oxygen Saturation % 92 %     1 Minute Liters of Oxygen 6 L     2 Minute Oxygen Saturation % 88 %  at 2:09 O2 82%, increased to 8L at 2:41 O2 84%, increased to 10L     2 Minute Liters of Oxygen 6 L  at 2:09 O2 82%, increased to 8L     3 Minute Oxygen Saturation % 90 %     3 Minute Liters of Oxygen 10 L     4 Minute Oxygen Saturation % 94 %     4 Minute Liters of Oxygen 10 L     5 Minute Oxygen Saturation % 92 %  5:45 O2 86%, PLB performed, O2 88%     5 Minute Liters of Oxygen 10 L  5:45 O2 86%, PLB performed, O2 88%     6 Minute Oxygen Saturation % 88 %     6 Minute Liters of Oxygen 10 L     2 Minute Post Oxygen Saturation % 99 %  4 min post O2 97%, HR 101, on 4L     2 Minute Post Liters of Oxygen 6 L  4 min post O2 97%, HR 101, on 4L              Oxygen Initial Assessment:  Oxygen Initial  Assessment - 01/15/23 1349       Initial 6 min Walk   Oxygen Used Continuous    Liters per minute 10             Oxygen Re-Evaluation:  Oxygen Re-Evaluation     Row Name 01/12/23 1325 01/15/23 1349 01/15/23 1433 02/20/23 0945       Program Oxygen Prescription   Program Oxygen Prescription -- -- -- Continuous    Liters per minute -- -- -- 8    Comments -- -- -- Pt initially needed 10L for exercise however recently she is only needing 8L.      Home Oxygen   Home Oxygen Device -- Portable Concentrator;Home Concentrator;E-Tanks Portable Concentrator;Home Concentrator;E-Tanks Portable Concentrator;Home Concentrator;E-Tanks    Sleep Oxygen Prescription -- Continuous;CPAP Continuous;CPAP Continuous;CPAP    Liters per minute 2 2  2 2    Home Exercise Oxygen Prescription -- Continuous Continuous Continuous    Liters per minute -- 8 8 8     Home Resting Oxygen Prescription -- Continuous Continuous Continuous    Liters per minute -- 5 5 5     Compliance with Home Oxygen Use -- Yes Yes Yes      Goals/Expected Outcomes   Short Term Goals To learn and exhibit compliance with exercise, home and travel O2 prescription;To learn and understand importance of maintaining oxygen saturations>88%;To learn and demonstrate proper use of respiratory medications;To learn and understand importance of monitoring SPO2 with pulse oximeter and demonstrate accurate use of the pulse oximeter.;To learn and demonstrate proper pursed lip breathing techniques or other breathing techniques.  To learn and exhibit compliance with exercise, home and travel O2 prescription;To learn and understand importance of maintaining oxygen saturations>88%;To learn and demonstrate proper use of respiratory medications;To learn and understand importance of monitoring SPO2 with pulse oximeter and demonstrate accurate use of the pulse oximeter.;To learn and demonstrate proper pursed lip breathing techniques or other breathing techniques.  To  learn and exhibit compliance with exercise, home and travel O2 prescription;To learn and understand importance of maintaining oxygen saturations>88%;To learn and demonstrate proper use of respiratory medications;To learn and understand importance of monitoring SPO2 with pulse oximeter and demonstrate accurate use of the pulse oximeter.;To learn and demonstrate proper pursed lip breathing techniques or other breathing techniques.  To learn and exhibit compliance with exercise, home and travel O2 prescription;To learn and understand importance of maintaining oxygen saturations>88%;To learn and demonstrate proper use of respiratory medications;To learn and understand importance of monitoring SPO2 with pulse oximeter and demonstrate accurate use of the pulse oximeter.;To learn and demonstrate proper pursed lip breathing techniques or other breathing techniques.     Long  Term Goals Exhibits compliance with exercise, home  and travel O2 prescription;Maintenance of O2 saturations>88%;Compliance with respiratory medication;Verbalizes importance of monitoring SPO2 with pulse oximeter and return demonstration;Exhibits proper breathing techniques, such as pursed lip breathing or other method taught during program session;Demonstrates proper use of MDI's Exhibits compliance with exercise, home  and travel O2 prescription;Maintenance of O2 saturations>88%;Compliance with respiratory medication;Verbalizes importance of monitoring SPO2 with pulse oximeter and return demonstration;Exhibits proper breathing techniques, such as pursed lip breathing or other method taught during program session;Demonstrates proper use of MDI's Exhibits compliance with exercise, home  and travel O2 prescription;Maintenance of O2 saturations>88%;Compliance with respiratory medication;Verbalizes importance of monitoring SPO2 with pulse oximeter and return demonstration;Exhibits proper breathing techniques, such as pursed lip breathing or other method  taught during program session;Demonstrates proper use of MDI's Exhibits compliance with exercise, home  and travel O2 prescription;Maintenance of O2 saturations>88%;Compliance with respiratory medication;Verbalizes importance of monitoring SPO2 with pulse oximeter and return demonstration;Exhibits proper breathing techniques, such as pursed lip breathing or other method taught during program session;Demonstrates proper use of MDI's    Comments -- -- Pt to begin exercise this week. Pt is currently requiring 8L of oxygen during exercise.    Goals/Expected Outcomes For Thella to be compliant with her oxygen and respiratory medications, know how to use a pulse oximeter, and know what to do if her oxygen <88%. For Kippie to be compliant with her oxygen and respiratory medications, know how to use a pulse oximeter, and know what to do if her oxygen <88%. Compliance and understanding of oxygen saturation monitoring and breathing techniques to decrease shortness of breath. Compliance and understanding of oxygen saturation monitoring and breathing techniques to decrease  shortness of breath.             Oxygen Discharge (Final Oxygen Re-Evaluation):  Oxygen Re-Evaluation - 02/20/23 0945       Program Oxygen Prescription   Program Oxygen Prescription Continuous    Liters per minute 8    Comments Pt initially needed 10L for exercise however recently she is only needing 8L.      Home Oxygen   Home Oxygen Device Portable Concentrator;Home Concentrator;E-Tanks    Sleep Oxygen Prescription Continuous;CPAP    Liters per minute 2    Home Exercise Oxygen Prescription Continuous    Liters per minute 8    Home Resting Oxygen Prescription Continuous    Liters per minute 5    Compliance with Home Oxygen Use Yes      Goals/Expected Outcomes   Short Term Goals To learn and exhibit compliance with exercise, home and travel O2 prescription;To learn and understand importance of maintaining oxygen  saturations>88%;To learn and demonstrate proper use of respiratory medications;To learn and understand importance of monitoring SPO2 with pulse oximeter and demonstrate accurate use of the pulse oximeter.;To learn and demonstrate proper pursed lip breathing techniques or other breathing techniques.     Long  Term Goals Exhibits compliance with exercise, home  and travel O2 prescription;Maintenance of O2 saturations>88%;Compliance with respiratory medication;Verbalizes importance of monitoring SPO2 with pulse oximeter and return demonstration;Exhibits proper breathing techniques, such as pursed lip breathing or other method taught during program session;Demonstrates proper use of MDI's    Comments Pt is currently requiring 8L of oxygen during exercise.    Goals/Expected Outcomes Compliance and understanding of oxygen saturation monitoring and breathing techniques to decrease shortness of breath.             Initial Exercise Prescription:  Initial Exercise Prescription - 01/12/23 1500       Date of Initial Exercise RX and Referring Provider   Date 01/12/23    Referring Provider Ramaswamy    Expected Discharge Date 04/10/23      Oxygen   Oxygen Continuous    Liters 10    Maintain Oxygen Saturation 88% or higher      Recumbant Elliptical   Level 1    RPM 25    Minutes 15      Track   Minutes 15    METs 2.56      Prescription Details   Frequency (times per week) 2    Duration Progress to 30 minutes of continuous aerobic without signs/symptoms of physical distress      Intensity   THRR 40-80% of Max Heartrate 62-124    Ratings of Perceived Exertion 11-13    Perceived Dyspnea 0-4      Progression   Progression Continue to progress workloads to maintain intensity without signs/symptoms of physical distress.      Resistance Training   Training Prescription Yes    Weight red bands    Reps 10-15             Perform Capillary Blood Glucose checks as needed.  Exercise  Prescription Changes:   Exercise Prescription Changes     Row Name 01/30/23 1500 02/13/23 1500 02/27/23 1500         Response to Exercise   Blood Pressure (Admit) 120/60 104/62 112/68     Blood Pressure (Exercise) 124/66 130/58 140/68     Blood Pressure (Exit) 118/52 104/56 96/60     Heart Rate (Admit) 81 bpm 84 bpm 93 bpm  Heart Rate (Exercise) 107 bpm 114 bpm 114 bpm     Heart Rate (Exit) 96 bpm 88 bpm 97 bpm     Oxygen Saturation (Admit) 99 % 100 % 99 %  on 6L     Oxygen Saturation (Exercise) 88 % 89 % 89 %  on 8L     Oxygen Saturation (Exit) 91 % 96 % 99 %  on 4L     Rating of Perceived Exertion (Exercise) 12 11 12      Perceived Dyspnea (Exercise) 1 1 2      Duration Continue with 30 min of aerobic exercise without signs/symptoms of physical distress. Continue with 30 min of aerobic exercise without signs/symptoms of physical distress. Continue with 30 min of aerobic exercise without signs/symptoms of physical distress.     Intensity THRR unchanged THRR unchanged THRR unchanged       Resistance Training   Training Prescription Yes Yes Yes     Weight red bands black bands black bands     Reps 10-15 10-15 10-15     Time 10 Minutes 10 Minutes 10 Minutes       Oxygen   Oxygen Continuous Continuous Continuous     Liters 10 5-8L 6-8L       Treadmill   MPH 2.4 2.5 2.5     Grade 1 1 2.5     Minutes 15 15 15      METs 3.17 3.26 3.78       Recumbant Elliptical   Level 2 3 3      Minutes 15 15 15      METs 4.6 3.9 3.9       Oxygen   Maintain Oxygen Saturation 88% or higher 88% or higher 88% or higher              Exercise Comments:   Exercise Comments     Row Name 01/18/23 1545           Exercise Comments Pt completed her first day of group exercise. Pt ambulated on the track for 15 min, METs 2.23, 8 laps. She then exercised on the recumbent elliptical for 15 min, level 1, METs 3.1. Pt performed warm up and cool down with verbal cues.                 Exercise Goals and Review:   Exercise Goals     Row Name 01/12/23 1337 01/15/23 1344 02/20/23 0941         Exercise Goals   Increase Physical Activity Yes Yes Yes     Intervention Provide advice, education, support and counseling about physical activity/exercise needs.;Develop an individualized exercise prescription for aerobic and resistive training based on initial evaluation findings, risk stratification, comorbidities and participant's personal goals. Provide advice, education, support and counseling about physical activity/exercise needs.;Develop an individualized exercise prescription for aerobic and resistive training based on initial evaluation findings, risk stratification, comorbidities and participant's personal goals. Provide advice, education, support and counseling about physical activity/exercise needs.;Develop an individualized exercise prescription for aerobic and resistive training based on initial evaluation findings, risk stratification, comorbidities and participant's personal goals.     Expected Outcomes Short Term: Attend rehab on a regular basis to increase amount of physical activity.;Long Term: Exercising regularly at least 3-5 days a week.;Long Term: Add in home exercise to make exercise part of routine and to increase amount of physical activity. Short Term: Attend rehab on a regular basis to increase amount of physical activity.;Long Term: Exercising regularly at least 3-5  days a week.;Long Term: Add in home exercise to make exercise part of routine and to increase amount of physical activity. Short Term: Attend rehab on a regular basis to increase amount of physical activity.;Long Term: Exercising regularly at least 3-5 days a week.;Long Term: Add in home exercise to make exercise part of routine and to increase amount of physical activity.     Increase Strength and Stamina Yes Yes Yes     Intervention Provide advice, education, support and counseling about physical  activity/exercise needs.;Develop an individualized exercise prescription for aerobic and resistive training based on initial evaluation findings, risk stratification, comorbidities and participant's personal goals. Provide advice, education, support and counseling about physical activity/exercise needs.;Develop an individualized exercise prescription for aerobic and resistive training based on initial evaluation findings, risk stratification, comorbidities and participant's personal goals. Provide advice, education, support and counseling about physical activity/exercise needs.;Develop an individualized exercise prescription for aerobic and resistive training based on initial evaluation findings, risk stratification, comorbidities and participant's personal goals.     Expected Outcomes Short Term: Increase workloads from initial exercise prescription for resistance, speed, and METs.;Short Term: Perform resistance training exercises routinely during rehab and add in resistance training at home;Long Term: Improve cardiorespiratory fitness, muscular endurance and strength as measured by increased METs and functional capacity ( ) Short Term: Increase workloads from initial exercise prescription for resistance, speed, and METs.;Short Term: Perform resistance training exercises routinely during rehab and add in resistance training at home;Long Term: Improve cardiorespiratory fitness, muscular endurance and strength as measured by increased METs and functional capacity ( ) Short Term: Increase workloads from initial exercise prescription for resistance, speed, and METs.;Short Term: Perform resistance training exercises routinely during rehab and add in resistance training at home;Long Term: Improve cardiorespiratory fitness, muscular endurance and strength as measured by increased METs and functional capacity ( )     Able to understand and use rate of perceived exertion (RPE) scale Yes Yes Yes     Intervention  Provide education and explanation on how to use RPE scale Provide education and explanation on how to use RPE scale Provide education and explanation on how to use RPE scale     Expected Outcomes Short Term: Able to use RPE daily in rehab to express subjective intensity level;Long Term:  Able to use RPE to guide intensity level when exercising independently Short Term: Able to use RPE daily in rehab to express subjective intensity level;Long Term:  Able to use RPE to guide intensity level when exercising independently Short Term: Able to use RPE daily in rehab to express subjective intensity level;Long Term:  Able to use RPE to guide intensity level when exercising independently     Able to understand and use Dyspnea scale Yes Yes Yes     Intervention Provide education and explanation on how to use Dyspnea scale Provide education and explanation on how to use Dyspnea scale Provide education and explanation on how to use Dyspnea scale     Expected Outcomes Short Term: Able to use Dyspnea scale daily in rehab to express subjective sense of shortness of breath during exertion;Long Term: Able to use Dyspnea scale to guide intensity level when exercising independently Short Term: Able to use Dyspnea scale daily in rehab to express subjective sense of shortness of breath during exertion;Long Term: Able to use Dyspnea scale to guide intensity level when exercising independently Short Term: Able to use Dyspnea scale daily in rehab to express subjective sense of shortness of breath during exertion;Long Term: Able to use Dyspnea  scale to guide intensity level when exercising independently     Knowledge and understanding of Target Heart Rate Range (THRR) Yes Yes Yes     Intervention Provide education and explanation of THRR including how the numbers were predicted and where they are located for reference Provide education and explanation of THRR including how the numbers were predicted and where they are located for  reference Provide education and explanation of THRR including how the numbers were predicted and where they are located for reference     Expected Outcomes Short Term: Able to state/look up THRR;Short Term: Able to use daily as guideline for intensity in rehab;Long Term: Able to use THRR to govern intensity when exercising independently Short Term: Able to state/look up THRR;Short Term: Able to use daily as guideline for intensity in rehab;Long Term: Able to use THRR to govern intensity when exercising independently Short Term: Able to state/look up THRR;Short Term: Able to use daily as guideline for intensity in rehab;Long Term: Able to use THRR to govern intensity when exercising independently     Understanding of Exercise Prescription Yes Yes Yes     Intervention Provide education, explanation, and written materials on patient's individual exercise prescription Provide education, explanation, and written materials on patient's individual exercise prescription Provide education, explanation, and written materials on patient's individual exercise prescription     Expected Outcomes Short Term: Able to explain program exercise prescription;Long Term: Able to explain home exercise prescription to exercise independently Short Term: Able to explain program exercise prescription;Long Term: Able to explain home exercise prescription to exercise independently Short Term: Able to explain program exercise prescription;Long Term: Able to explain home exercise prescription to exercise independently              Exercise Goals Re-Evaluation :  Exercise Goals Re-Evaluation     Row Name 01/15/23 1344 02/20/23 0941           Exercise Goal Re-Evaluation   Exercise Goals Review Increase Physical Activity;Able to understand and use Dyspnea scale;Understanding of Exercise Prescription;Increase Strength and Stamina;Able to understand and use rate of perceived exertion (RPE) scale;Knowledge and understanding of  Target Heart Rate Range (THRR) Increase Physical Activity;Able to understand and use Dyspnea scale;Understanding of Exercise Prescription;Increase Strength and Stamina;Able to understand and use rate of perceived exertion (RPE) scale;Knowledge and understanding of Target Heart Rate Range (THRR)      Comments Pt to begin exercising this week. It is too early to discern progression. Pt has completed 8 exercise sessions. She missed one session for illness. She is exercising on the treadmill for 15 min, speed 2.5, incline 1 with METs 3.26. She then is exercising on the recumbent elliptical at level 3, METs 4.6, for 15 min. She is very motivated and pushes herself. Performs warm up and cool down unlimited. Will continue to progress as tolerated.      Expected Outcomes Through exercise at rehab and home the patient will decrease shortness of breath with daily activities and feel confident in carrying out an exercise regimn at home Through exercise at rehab and home the patient will decrease shortness of breath with daily activities and feel confident in carrying out an exercise regimn at home               Discharge Exercise Prescription (Final Exercise Prescription Changes):  Exercise Prescription Changes - 02/27/23 1500       Response to Exercise   Blood Pressure (Admit) 112/68    Blood Pressure (Exercise) 140/68  Blood Pressure (Exit) 96/60    Heart Rate (Admit) 93 bpm    Heart Rate (Exercise) 114 bpm    Heart Rate (Exit) 97 bpm    Oxygen Saturation (Admit) 99 %   on 6L   Oxygen Saturation (Exercise) 89 %   on 8L   Oxygen Saturation (Exit) 99 %   on 4L   Rating of Perceived Exertion (Exercise) 12    Perceived Dyspnea (Exercise) 2    Duration Continue with 30 min of aerobic exercise without signs/symptoms of physical distress.    Intensity THRR unchanged      Resistance Training   Training Prescription Yes    Weight black bands    Reps 10-15    Time 10 Minutes      Oxygen   Oxygen  Continuous    Liters 6-8L      Treadmill   MPH 2.5    Grade 2.5    Minutes 15    METs 3.78      Recumbant Elliptical   Level 3    Minutes 15    METs 3.9      Oxygen   Maintain Oxygen Saturation 88% or higher             Nutrition:  Target Goals: Understanding of nutrition guidelines, daily intake of sodium 1500mg , cholesterol 200mg , calories 30% from fat and 7% or less from saturated fats, daily to have 5 or more servings of fruits and vegetables.  Biometrics:   Post Biometrics - 01/12/23 1306        Post  Biometrics   Grip Strength 24 kg             Nutrition Therapy Plan and Nutrition Goals:  Nutrition Therapy & Goals - 02/15/23 1439       Nutrition Therapy   Diet General Healthy Diet    Drug/Food Interactions Statins/Certain Fruits      Personal Nutrition Goals   Nutrition Goal Patient to improve diet quality by using the plate method as a guide for meal planning to include lean protein/plant protein, fruits, vegetables, whole grains, nonfat dairy as part of a well-balanced diet.    Personal Goal #2 Patient to maintain weight while participating in pulmonary rehab program.    Comments Jeorgia recently decreased dose of Esbriet due to GI side effects and decreased appetite. However, she continues to lose weight; she is down 5# since starting with our program. She declined multiple strategies for weight gain including introducing nutrition supplements and/or increasing eating frequency to aid with increasing calories. She reports that she continues to eat three meals daily. Will continue to montor weekly weights. She has medical history of Alpha Gal, squamous cell carcinoma of right lung. Maridee will continue to benefit from pulmonary rehab for nutrition and exercise support.      Intervention Plan   Intervention Prescribe, educate and counsel regarding individualized specific dietary modifications aiming towards targeted core components such as weight,  hypertension, lipid management, diabetes, heart failure and other comorbidities.;Nutrition handout(s) given to patient.    Expected Outcomes Short Term Goal: Understand basic principles of dietary content, such as calories, fat, sodium, cholesterol and nutrients.;Long Term Goal: Adherence to prescribed nutrition plan.             Nutrition Assessments:  MEDIFICTS Score Key: ?70 Need to make dietary changes  40-70 Heart Healthy Diet ? 40 Therapeutic Level Cholesterol Diet   Picture Your Plate Scores: <16 Unhealthy dietary pattern with much room for  improvement. 41-50 Dietary pattern unlikely to meet recommendations for good health and room for improvement. 51-60 More healthful dietary pattern, with some room for improvement.  >60 Healthy dietary pattern, although there may be some specific behaviors that could be improved.    Nutrition Goals Re-Evaluation:  Nutrition Goals Re-Evaluation     Row Name 01/18/23 1504 02/15/23 1439           Goals   Current Weight 144 lb 13.5 oz (65.7 kg) 140 lb 3.4 oz (63.6 kg)      Comment Lipids WNL (2022) hemoglobin 11.3, ALT/AST WNL      Expected Outcome Taela recently increased dose of Esbriet and continues to struggle with nausea, vomiting. Discussed small, frequent meals and easy to digest foods to aid with weight maintenance when not feeling well. She does enjoy a wide variety of foods. She has medical history of Alpha Gal, squamous cell carcinoma of right lung. Countney will continue to benefit from pulmonary rehab for nutrition and exercise support. Arrah recently decreased dose of Esbriet due to GI side effects and decreased appetite. However, she continues to lose weight; she is down 5# since starting with our program. She declined multiple strategies for weight gain including introducing nutrition supplements and/or increasing eating frequency to aid with increasing calories. She reports that she continues to eat three meals daily. Will  continue to montor weekly weights. She has medical history of Alpha Gal, squamous cell carcinoma of right lung. Dezirae will continue to benefit from pulmonary rehab for nutrition and exercise support               Nutrition Goals Discharge (Final Nutrition Goals Re-Evaluation):  Nutrition Goals Re-Evaluation - 02/15/23 1439       Goals   Current Weight 140 lb 3.4 oz (63.6 kg)    Comment hemoglobin 11.3, ALT/AST WNL    Expected Outcome Crystaline recently decreased dose of Esbriet due to GI side effects and decreased appetite. However, she continues to lose weight; she is down 5# since starting with our program. She declined multiple strategies for weight gain including introducing nutrition supplements and/or increasing eating frequency to aid with increasing calories. She reports that she continues to eat three meals daily. Will continue to montor weekly weights. She has medical history of Alpha Gal, squamous cell carcinoma of right lung. Myosha will continue to benefit from pulmonary rehab for nutrition and exercise support             Psychosocial: Target Goals: Acknowledge presence or absence of significant depression and/or stress, maximize coping skills, provide positive support system. Participant is able to verbalize types and ability to use techniques and skills needed for reducing stress and depression.  Initial Review & Psychosocial Screening:  Initial Psych Review & Screening - 01/12/23 1327       Initial Review   Current issues with Current Depression;Current Anxiety/Panic;Current Psychotropic Meds      Family Dynamics   Good Support System? Yes    Comments Mahalie stated she feels like her diagnosis was a "blow to the gut". She pictured her retirement as vacations with her family and living her life to the fullest. Being diagnosed, having chemo and radiation, and now requiring high oxygen requirements has been hard for her to handle. She declines a referral currently  for mental health assistance. She states she has great support from her husband and family.      Barriers   Psychosocial barriers to participate in program The patient should  benefit from training in stress management and relaxation.;Psychosocial barriers identified (see note)      Screening Interventions   Interventions Encouraged to exercise;To provide support and resources with identified psychosocial needs    Expected Outcomes Long Term Goal: Stressors or current issues are controlled or eliminated.;Short Term goal: Identification and review with participant of any Quality of Life or Depression concerns found by scoring the questionnaire.;Long Term goal: The participant improves quality of Life and PHQ9 Scores as seen by post scores and/or verbalization of changes             Quality of Life Scores:  Scores of 19 and below usually indicate a poorer quality of life in these areas.  A difference of  2-3 points is a clinically meaningful difference.  A difference of 2-3 points in the total score of the Quality of Life Index has been associated with significant improvement in overall quality of life, self-image, physical symptoms, and general health in studies assessing change in quality of life.  PHQ-9: Review Flowsheet  More data exists      01/12/2023 07/25/2022 06/15/2020 06/11/2020 03/31/2020  Depression screen PHQ 2/9  Decreased Interest 2 0 0 0 1  Down, Depressed, Hopeless 2 0 0 0 1  PHQ - 2 Score 4 0 0 0 2  Altered sleeping 1 1 - - 0  Tired, decreased energy 2 0 - - 1  Change in appetite 1 0 - - 1  Feeling bad or failure about yourself  1 0 - - 1  Trouble concentrating 1 1 - - 1  Moving slowly or fidgety/restless 0 0 - - 1  Suicidal thoughts 1 0 - - 0  PHQ-9 Score 11 2 - - 7  Difficult doing work/chores Somewhat difficult Not difficult at all - - Somewhat difficult   Interpretation of Total Score  Total Score Depression Severity:  1-4 = Minimal depression, 5-9 = Mild  depression, 10-14 = Moderate depression, 15-19 = Moderately severe depression, 20-27 = Severe depression   Psychosocial Evaluation and Intervention:  Psychosocial Evaluation - 01/12/23 1449       Psychosocial Evaluation & Interventions   Interventions Encouraged to exercise with the program and follow exercise prescription    Comments Taje stated she feels like her diagnosis was a "blow to the gut". She pictured her retirement as vacations with her family and living her life to the fullest. Being diagnosed, having chemo and radiation, and now requiring high oxygen requirements has been hard for her to handle. She declines a referral currently for mental health assistance. She states she has great support from her husband and family.    Expected Outcomes For Kadeeja to participated in pulmonary rehab without any psychosocial barriers or concerns    Continue Psychosocial Services  Follow up required by staff   We will continue to monitor and assess for any needs            Psychosocial Re-Evaluation:  Psychosocial Re-Evaluation     Row Name 01/12/23 1448 02/23/23 1610           Psychosocial Re-Evaluation   Current issues with Current Depression;Current Anxiety/Panic;Current Psychotropic Meds Current Depression;Current Anxiety/Panic;Current Psychotropic Meds      Comments No new changes since orientation on 01/12/23. Analilia is scheduled to start the program on 01/18/2023 Jenavieve still states that the diagnosis was a "blow". She didn't picture her retirement the way it is now. She is upset about her travel restrictions due to her high oxygen  demands. Nyhla has also started Pirfenidone and has debilitating GI side effects. She is distraught she can not tolerate the full dose prescribed by her MD. Our dietician is helping Cletis gain weight as she has been consistently losing weight due to the medication. At first, she wasn't receptive, but is opening up to the idea of increasing her PO  intake.      Expected Outcomes For Kerin to attend PR without any psychosocial barriers or concerns For Chrissandra to continue to attend PR without any psychosocial barriers or concerns. For Caelen to get professional mental health assistance.      Interventions Encouraged to attend Pulmonary Rehabilitation for the exercise Encouraged to attend Pulmonary Rehabilitation for the exercise      Continue Psychosocial Services  Follow up required by staff  We will continue to monitor and assess Nataleah Follow up required by staff  We will continue to monitor and assess [facility]               Psychosocial Discharge (Final Psychosocial Re-Evaluation):  Psychosocial Re-Evaluation - 02/23/23 1610       Psychosocial Re-Evaluation   Current issues with Current Depression;Current Anxiety/Panic;Current Psychotropic Meds    Comments Emmalene still states that the diagnosis was a "blow". She didn't picture her retirement the way it is now. She is upset about her travel restrictions due to her high oxygen demands. Kenisha has also started Pirfenidone and has debilitating GI side effects. She is distraught she can not tolerate the full dose prescribed by her MD. Our dietician is helping Saragrace gain weight as she has been consistently losing weight due to the medication. At first, she wasn't receptive, but is opening up to the idea of increasing her PO intake.    Expected Outcomes For Burke to continue to attend PR without any psychosocial barriers or concerns. For Tomecka to get professional mental health assistance.    Interventions Encouraged to attend Pulmonary Rehabilitation for the exercise    Continue Psychosocial Services  Follow up required by staff   We will continue to monitor and assess Taina            Education: Education Goals: Education classes will be provided on a weekly basis, covering required topics. Participant will state understanding/return demonstration of topics  presented.  Learning Barriers/Preferences:   Education Topics: Introduction to Pulmonary Rehab Group instruction provided by PowerPoint, verbal discussion, and written material to support subject matter. Instructor reviews what Pulmonary Rehab is, the purpose of the program, and how patients are referred.     Know Your Numbers Group instruction that is supported by a PowerPoint presentation. Instructor discusses importance of knowing and understanding resting, exercise, and post-exercise oxygen saturation, heart rate, and blood pressure. Oxygen saturation, heart rate, blood pressure, rating of perceived exertion, and dyspnea are reviewed along with a normal range for these values.  Flowsheet Row PULMONARY REHAB OTHER RESPIRATORY from 01/25/2023 in [facility] for Heart, Vascular, & Lung Health  Date 01/25/23  Educator EP  Instruction Review Code 1- Verbalizes Understanding       Exercise for the Pulmonary Patient Group instruction that is supported by a PowerPoint presentation. Instructor discusses benefits of exercise, core components of exercise, frequency, duration, and intensity of an exercise routine, importance of utilizing pulse oximetry during exercise, safety while exercising, and options of places to exercise outside of rehab.  Flowsheet Row PULMONARY REHAB OTHER RESPIRATORY from 01/18/2023 in [facility] for Heart, Vascular, &  Lung Health  Date 01/18/23  Educator EP  Instruction Review Code 1- Verbalizes Understanding       MET Level  Group instruction provided by PowerPoint, verbal discussion, and written material to support subject matter. Instructor reviews what METs are and how to increase METs.  Flowsheet Row PULMONARY REHAB OTHER RESPIRATORY from 02/15/2023 in [facility] for Heart, Vascular, & Lung Health  Date 02/15/23  Educator EP  Instruction Review Code 1- Verbalizes Understanding        Pulmonary Medications Verbally interactive group education provided by instructor with focus on inhaled medications and proper administration.   Anatomy and Physiology of the Respiratory System Group instruction provided by PowerPoint, verbal discussion, and written material to support subject matter. Instructor reviews respiratory cycle and anatomical components of the respiratory system and their functions. Instructor also reviews differences in obstructive and restrictive respiratory diseases with examples of each.    Oxygen Safety Group instruction provided by PowerPoint, verbal discussion, and written material to support subject matter. There is an overview of "What is Oxygen" and "Why do we need it".  Instructor also reviews how to create a safe environment for oxygen use, the importance of using oxygen as prescribed, and the risks of noncompliance. There is a brief discussion on traveling with oxygen and resources the patient may utilize. Flowsheet Row PULMONARY REHAB OTHER RESPIRATORY from 02/01/2023 in [facility] for Heart, Vascular, & Lung Health  Date 02/01/23  Educator RN  Instruction Review Code 1- Verbalizes Understanding       Oxygen Use Group instruction provided by PowerPoint, verbal discussion, and written material to discuss how supplemental oxygen is prescribed and different types of oxygen supply systems. Resources for more information are provided.    Breathing Techniques Group instruction that is supported by demonstration and informational handouts. Instructor discusses the benefits of pursed lip and diaphragmatic breathing and detailed demonstration on how to perform both.  Flowsheet Row PULMONARY REHAB OTHER RESPIRATORY from 02/22/2023 in  [facility] for Heart, Vascular, & Lung Health  Date 02/22/23  Educator RT  Instruction Review Code 1- Verbalizes Understanding        Risk Factor Reduction Group  instruction that is supported by a PowerPoint presentation. Instructor discusses the definition of a risk factor, different risk factors for pulmonary disease, and how the heart and lungs work together.   MD Day A group question and answer session with a medical doctor that allows participants to ask questions that relate to their pulmonary disease state.   Nutrition for the Pulmonary Patient Group instruction provided by PowerPoint slides, verbal discussion, and written materials to support subject matter. The instructor gives an explanation and review of healthy diet recommendations, which includes a discussion on weight management, recommendations for fruit and vegetable consumption, as well as protein, fluid, caffeine, fiber, sodium, sugar, and alcohol. Tips for eating when patients are short of breath are discussed.    Other Education Group or individual verbal, written, or video instructions that support the educational goals of the pulmonary rehab program.    Knowledge Questionnaire Score:  Knowledge Questionnaire Score - 01/12/23 1450       Knowledge Questionnaire Score   Pre Score 17/18             Core Components/Risk Factors/Patient Goals at Admission:  Personal Goals and Risk Factors at Admission - 01/12/23 1335       Core Components/Risk Factors/Patient Goals on Admission    Weight  Management Weight Maintenance    Improve shortness of breath with ADL's Yes    Intervention Provide education, individualized exercise plan and daily activity instruction to help decrease symptoms of SOB with activities of daily living.    Expected Outcomes Short Term: Improve cardiorespiratory fitness to achieve a reduction of symptoms when performing ADLs;Long Term: Be able to perform more ADLs without symptoms or delay the onset of symptoms    Increase knowledge of respiratory medications and ability to use respiratory devices properly  Yes    Intervention Provide education and  demonstration as needed of appropriate use of medications, inhalers, and oxygen therapy.    Expected Outcomes Short Term: Achieves understanding of medications use. Understands that oxygen is a medication prescribed by physician. Demonstrates appropriate use of inhaler and oxygen therapy.;Long Term: Maintain appropriate use of medications, inhalers, and oxygen therapy.             Core Components/Risk Factors/Patient Goals Review:   Goals and Risk Factor Review     Row Name 01/17/23 1537 02/23/23 0838           Core Components/Risk Factors/Patient Goals Review   Personal Goals Review Weight Management/Obesity;Improve shortness of breath with ADL's;Develop more efficient breathing techniques such as purse lipped breathing and diaphragmatic breathing and practicing self-pacing with activity.;Increase knowledge of respiratory medications and ability to use respiratory devices properly. Weight Management/Obesity;Improve shortness of breath with ADL's;Develop more efficient breathing techniques such as purse lipped breathing and diaphragmatic breathing and practicing self-pacing with activity.;Increase knowledge of respiratory medications and ability to use respiratory devices properly.      Review Unable to assess goals since Bronson Curb has not started the program yet. She is scheduled to start on 01/18/2023 Story has met her goal of increasing her knowledge of respiratory meds and using them properly. She has correctly stated when to use her inhaler and has properly demonstrated it with our respiratory therapist. She has also met her goal of developing more efficient breathing techniques and self-pacing. Toluwanimi can initiate pursed lip breathing when she is short of breath without staff involvement. Shequita is still working on improving her shortness of breath but feels like PR is helping. Her oxygen saturation has maintained on 8L while exercising. Savahna is working with our dietician on maintaining  her weight. Since the start of Pirfenidone she has been consistently losing weight. She is striving to achieve her goal of weight gain and weight maintenance.      Expected Outcomes See Admission Goals See Admission Goals               Core Components/Risk Factors/Patient Goals at Discharge (Final Review):   Goals and Risk Factor Review - 02/23/23 0838       Core Components/Risk Factors/Patient Goals Review   Personal Goals Review Weight Management/Obesity;Improve shortness of breath with ADL's;Develop more efficient breathing techniques such as purse lipped breathing and diaphragmatic breathing and practicing self-pacing with activity.;Increase knowledge of respiratory medications and ability to use respiratory devices properly.    Review Nary has met her goal of increasing her knowledge of respiratory meds and using them properly. She has correctly stated when to use her inhaler and has properly demonstrated it with our respiratory therapist. She has also met her goal of developing more efficient breathing techniques and self-pacing. Tikeya can initiate pursed lip breathing when she is short of breath without staff involvement. Ronalda is still working on improving her shortness of breath but feels like PR is helping. Her oxygen  saturation has maintained on 8L while exercising. Arnelle is working with our dietician on maintaining her weight. Since the start of Pirfenidone she has been consistently losing weight. She is striving to achieve her goal of weight gain and weight maintenance.    Expected Outcomes See Admission Goals             ITP Comments: Pt is making expected progress toward Pulmonary Rehab goals after completing 11 sessions. Recommend continued exercise, life style modification, education, and utilization of breathing techniques to increase stamina and strength, while also decreasing shortness of breath with exertion.   Comments: Dr. Mechele Collin is Medical Director  for Pulmonary Rehab at [facility]

## 2023-03-01 ENCOUNTER — Encounter (HOSPITAL_COMMUNITY)
Admission: RE | Admit: 2023-03-01 | Discharge: 2023-03-01 | Disposition: A | Discharge status: Home / Self Care | Payer: Medicare Other | Source: Ambulatory Visit | Attending: Internal Medicine | Admitting: Internal Medicine

## 2023-03-01 DIAGNOSIS — J849 Interstitial pulmonary disease, unspecified: Secondary | ICD-10-CM

## 2023-03-01 NOTE — Progress Notes (Signed)
Daily Session Note  Patient Details  Name: Diane [name] MRN: [id number] Date of Birth: Feb 15, 1956 Referring Provider:   Doristine Devoid Pulmonary Rehab Walk Test from 01/12/2023 in [facility] for Heart, Vascular, & Lung Health  Referring Provider Ramaswamy       Encounter Date: 03/01/2023  Check In:  Session Check In - 03/01/23 1448       Check-In   Supervising physician immediately available to respond to emergencies [facility] MD immediately available    Physician(s) Christy Sartorius, NP    Location [facility]    Staff Present Essie Hart, RN, Doris Cheadle, MS, ACSM-CEP, Exercise Physiologist;Casey Katrinka Blazing, RT;Randi Idelle Crouch BS, ACSM-CEP, Exercise Physiologist;David Manus Gunning, MS, ACSM-CEP, CCRP, Exercise Physiologist;Samantha Belarus, RD, LDN;Jetta Walker BS, ACSM-CEP, Exercise Physiologist    Virtual Visit No    Medication changes reported     No    Fall or balance concerns reported    No    Tobacco Cessation No Change    Warm-up and Cool-down Performed as group-led instruction    Resistance Training Performed Yes    VAD Patient? No    PAD/SET Patient? No      Pain Assessment   Currently in Pain? No/denies    Pain Score 0-No pain    Multiple Pain Sites No             Capillary Blood Glucose: No results found for this or any previous visit (from the past 24 hour(s)).    Social History   Tobacco Use  Smoking Status Former   Packs/day: 0.50   Years: 30.00   Additional pack years: 0.00   Total pack years: 15.00   Types: Cigarettes   Quit date: 10/09/2021   Years since quitting: 1.3  Smokeless Tobacco Never  Tobacco Comments   5-10 cigarettes a day (plans to quit prior to this surgery 05/06/15)    Goals Met:  Proper associated with RPD/PD & O2 Sat Independence with exercise equipment Exercise tolerated well No report of concerns or symptoms today Strength training completed today  Goals Unmet:  Not  Applicable  Comments: Service time is from 1315 to 1458.    Dr. Mechele Collin is Medical Director for Pulmonary Rehab at [facility]

## 2023-03-01 NOTE — Progress Notes (Signed)
Home Exercise Prescription I have reviewed a Home Exercise Prescription with Diane [name]. Pt used to walk her dog 15 min x3 a day but is disappointed as she struggles to walk 15 min x1 a day now. Encouraged her to focus on trying 15 min 1-2 nonrehab days a week on 8-10L.Pt agreed. The patient stated that their goals were to get back to walking and be able to travel. We reviewed exercise guidelines, target heart rate during exercise, RPE Scale, weather conditions, endpoints for exercise, warmup and cool down. The patient is encouraged to come to me with any questions. I will continue to follow up with the patient to assist them with progression and safety.  Spent 15 min discussing home exercise plan and goals.  Inocencio Roy [place], [place], ACSM-CEP 03/01/2023 3:25 PM

## 2023-03-06 ENCOUNTER — Encounter (HOSPITAL_COMMUNITY)
Admission: RE | Admit: 2023-03-06 | Discharge: 2023-03-06 | Disposition: A | Discharge status: Home / Self Care | Payer: Medicare Other | Source: Ambulatory Visit | Attending: Internal Medicine | Admitting: Internal Medicine

## 2023-03-06 DIAGNOSIS — J849 Interstitial pulmonary disease, unspecified: Secondary | ICD-10-CM

## 2023-03-06 NOTE — Progress Notes (Signed)
Daily Session Note  Patient Details  Name: Diane [name] MRN: [id number] Date of Birth: 1956-03-24 Referring Provider:   Doristine Devoid Pulmonary Rehab Walk Test from 01/12/2023 in [facility] for Heart, Vascular, & Lung Health  Referring Provider Ramaswamy       Encounter Date: 03/06/2023  Check In:  Session Check In - 03/06/23 1431       Check-In   Supervising physician immediately available to respond to emergencies [facility] immediately available    Physician(s) Bernadene Person, NP    [facility]    Staff Present Essie Hart, RN, Doris Cheadle, MS, ACSM-CEP, Exercise Physiologist;Casey Erin Sons BS, ACSM-CEP, Exercise Physiologist    Virtual Visit No    Medication changes reported     No    Fall or balance concerns reported    No    Tobacco Cessation No Change    Warm-up and Cool-down Performed as group-led instruction    Resistance Training Performed Yes    VAD Patient? No    PAD/SET Patient? No      Pain Assessment   Currently in Pain? No/denies    Pain Score 0-No pain    Multiple Pain Sites No             Capillary Blood Glucose: No results found for this or any previous visit (from the past 24 hour(s)).    Social History   Tobacco Use  Smoking Status Former   Packs/day: 0.50   Years: 30.00   Additional pack years: 0.00   Total pack years: 15.00   Types: Cigarettes   Quit date: 10/09/2021   Years since quitting: 1.4  Smokeless Tobacco Never  Tobacco Comments   5-10 cigarettes a day (plans to quit prior to this surgery 05/06/15)    Goals Met:  Independence with exercise equipment Exercise tolerated well No report of concerns or symptoms today Strength training completed today  Goals Unmet:  Not Applicable  Comments: Service time is from 1311 to 1451    Dr. Mechele Collin is Medical Director for Pulmonary Rehab at [facility].

## 2023-03-08 ENCOUNTER — Encounter (HOSPITAL_COMMUNITY)
Admission: RE | Admit: 2023-03-08 | Discharge: 2023-03-08 | Disposition: A | Discharge status: Home / Self Care | Payer: Medicare Other | Source: Ambulatory Visit | Attending: Internal Medicine | Admitting: Internal Medicine

## 2023-03-08 DIAGNOSIS — J449 Chronic obstructive pulmonary disease, unspecified: Secondary | ICD-10-CM

## 2023-03-08 DIAGNOSIS — J849 Interstitial pulmonary disease, unspecified: Secondary | ICD-10-CM

## 2023-03-08 DIAGNOSIS — J479 Bronchiectasis, uncomplicated: Secondary | ICD-10-CM

## 2023-03-08 NOTE — Progress Notes (Signed)
Daily Session Note  Patient Details  Name: Diane [name] MRN: [id number] Date of Birth: 1955-12-30 Referring Provider:   Doristine Devoid Pulmonary Rehab Walk Test from 01/12/2023 in [facility] for Heart, Vascular, & Lung Health  Referring Provider Ramaswamy       Encounter Date: 03/08/2023  Check In:  Session Check In - 03/08/23 1433       Check-In   Supervising physician immediately available to respond to emergencies [facility] immediately available    Physician(s) Robin Searing, NP    [facility]    Staff Present Essie Hart, RN, Doris Cheadle, MS, ACSM-CEP, Exercise Physiologist;Casey Erin Sons BS, ACSM-CEP, Exercise Physiologist    Virtual Visit No    Medication changes reported     No    Fall or balance concerns reported    No    Tobacco Cessation No Change    Warm-up and Cool-down Performed as group-led instruction    Resistance Training Performed Yes    VAD Patient? No      Pain Assessment   Currently in Pain? No/denies    Pain Score 0-No pain    Multiple Pain Sites No             Capillary Blood Glucose: No results found for this or any previous visit (from the past 24 hour(s)).    Social History   Tobacco Use  Smoking Status Former   Packs/day: 0.50   Years: 30.00   Additional pack years: 0.00   Total pack years: 15.00   Types: Cigarettes   Quit date: 10/09/2021   Years since quitting: 1.4  Smokeless Tobacco Never  Tobacco Comments   5-10 cigarettes a day (plans to quit prior to this surgery 05/06/15)    Goals Met:  Proper associated with RPD/PD & O2 Sat Independence with exercise equipment Exercise tolerated well No report of concerns or symptoms today Strength training completed today  Goals Unmet:  Not Applicable  Comments: Service time is from 1310 to 1437.    Dr. Mechele Collin is Medical Director for Pulmonary Rehab at [facility].

## 2023-03-13 ENCOUNTER — Encounter (HOSPITAL_COMMUNITY)
Admission: RE | Admit: 2023-03-13 | Discharge: 2023-03-13 | Disposition: A | Discharge status: Home / Self Care | Payer: Medicare Other | Source: Ambulatory Visit | Attending: Internal Medicine | Admitting: Internal Medicine

## 2023-03-13 VITALS — Wt 137.3 lb

## 2023-03-13 DIAGNOSIS — J849 Interstitial pulmonary disease, unspecified: Secondary | ICD-10-CM | POA: Diagnosis not present

## 2023-03-13 NOTE — Progress Notes (Signed)
Daily Session Note  Patient Details  Name: Diane [name] MRN: [id number] Date of Birth: [birth year] Referring Provider:   Doristine Devoid Pulmonary Rehab Walk Test from 01/12/2023 in [facility] for Heart, Vascular, & Lung Health  Referring Provider Ramaswamy       Encounter Date: 03/13/2023  Check In:  Session Check In - 03/13/23 1432       Check-In   Supervising physician immediately available to respond to emergencies [facility] immediately available    Physician(s) Robin Searing, NP    [facility]    Staff Present Samantha Belarus, RD, Dutch Gray, RN, BSN;Randi Idelle Crouch BS, ACSM-CEP, Exercise Physiologist;Kaylee Earlene Plater, MS, ACSM-CEP, Exercise Physiologist;Casey Katrinka Blazing, RT    Virtual Visit No    Medication changes reported     No    Fall or balance concerns reported    No    Tobacco Cessation No Change    Warm-up and Cool-down Performed as group-led instruction    Resistance Training Performed Yes    VAD Patient? No    PAD/SET Patient? No      Pain Assessment   Currently in Pain? No/denies    Multiple Pain Sites No             Capillary Blood Glucose: No results found for this or any previous visit (from the past 24 hour(s)).   Exercise Prescription Changes - 03/13/23 1500       Response to Exercise   Blood Pressure (Admit) 104/60    Blood Pressure (Exercise) 148/64    Blood Pressure (Exit) 102/64    Heart Rate (Admit) 81 bpm    Heart Rate (Exercise) 122 bpm    Heart Rate (Exit) 104 bpm   recheck 87   Oxygen Saturation (Admit) 100 %   6L   Oxygen Saturation (Exercise) 85 %   after PLB came up to 92%   Oxygen Saturation (Exit) 94 %   on 4L   Rating of Perceived Exertion (Exercise) 13    Perceived Dyspnea (Exercise) 2    Duration Continue with 30 min of aerobic exercise without signs/symptoms of physical distress.    Intensity THRR unchanged      Resistance Training   Training Prescription Yes    Weight  black bands    Reps 10-15    Time 10 Minutes      Oxygen   Oxygen Continuous    Liters 6-8L      Treadmill   MPH 2.5    Grade 3    Minutes 15    METs 3.95      Recumbant Elliptical   Level 4    Minutes 15    METs 5.1      Oxygen   Maintain Oxygen Saturation 88% or higher             Social History   Tobacco Use  Smoking Status Former   Packs/day: 0.50   Years: 30.00   Additional pack years: 0.00   Total pack years: 15.00   Types: Cigarettes   Quit date: 10/09/2021   Years since quitting: 1.4  Smokeless Tobacco Never  Tobacco Comments   5-10 cigarettes a day (plans to quit prior to this surgery 05/06/15)    Goals Met:  Independence with exercise equipment Exercise tolerated well No report of concerns or symptoms today Strength training completed today  Goals Unmet:  Not Applicable  Comments: Service time is from 1315 to 1450  Dr. Mechele Collin is Medical Director for Pulmonary Rehab at [facility].

## 2023-03-15 ENCOUNTER — Encounter (HOSPITAL_COMMUNITY)
Admission: RE | Admit: 2023-03-15 | Discharge: 2023-03-15 | Disposition: A | Discharge status: Home / Self Care | Payer: Medicare Other | Source: Ambulatory Visit | Attending: Internal Medicine | Admitting: Internal Medicine

## 2023-03-15 DIAGNOSIS — J849 Interstitial pulmonary disease, unspecified: Secondary | ICD-10-CM

## 2023-03-15 DIAGNOSIS — J449 Chronic obstructive pulmonary disease, unspecified: Secondary | ICD-10-CM

## 2023-03-15 DIAGNOSIS — J479 Bronchiectasis, uncomplicated: Secondary | ICD-10-CM

## 2023-03-15 NOTE — Progress Notes (Signed)
Daily Session Note  Patient Details  Name: Diane [name] MRN: [id number] Date of Birth: 1955/12/14 Referring Provider:   Doristine Devoid Pulmonary Rehab Walk Test from 01/12/2023 in [facility] for Heart, Vascular, & Lung Health  Referring Provider Ramaswamy       Encounter Date: 03/15/2023  Check In:  Session Check In - 03/15/23 1416       Check-In   Supervising physician immediately available to respond to emergencies [facility] immediately available    Physician(s) Edd Fabian, NP    [facility]    Staff Present Samantha Belarus, RD, Dutch Gray, RN, BSN;Randi Reeve BS, ACSM-CEP, Exercise Physiologist;Kaylee Earlene Plater, MS, ACSM-CEP, Exercise Physiologist;Casey Glenetta Borg, MS, Exercise Physiologist;David Manus Gunning, MS, ACSM-CEP, CCRP, Exercise Physiologist    Virtual Visit No    Medication changes reported     No    Fall or balance concerns reported    No    Tobacco Cessation No Change    Warm-up and Cool-down Performed as group-led instruction    Resistance Training Performed Yes    VAD Patient? No    PAD/SET Patient? No      Pain Assessment   Currently in Pain? No/denies    Pain Score 0-No pain    Multiple Pain Sites No             Capillary Blood Glucose: No results found for this or any previous visit (from the past 24 hour(s)).    Social History   Tobacco Use  Smoking Status Former   Packs/day: 0.50   Years: 30.00   Additional pack years: 0.00   Total pack years: 15.00   Types: Cigarettes   Quit date: 10/09/2021   Years since quitting: 1.4  Smokeless Tobacco Never  Tobacco Comments   5-10 cigarettes a day (plans to quit prior to this surgery 05/06/15)    Goals Met:  Independence with exercise equipment Exercise tolerated well Personal goals reviewed Strength training completed today  Goals Unmet:  Not Applicable  Comments: Service time is from 1320 to 1445    Dr. Mechele Collin  is Medical Director for Pulmonary Rehab at [facility].

## 2023-03-20 ENCOUNTER — Telehealth (HOSPITAL_COMMUNITY): Payer: Self-pay | Admitting: *Deleted

## 2023-03-20 ENCOUNTER — Encounter (HOSPITAL_COMMUNITY)
Admission: RE | Admit: 2023-03-20 | Discharge: 2023-03-20 | Disposition: A | Discharge status: Home / Self Care | Payer: Medicare Other | Source: Ambulatory Visit | Attending: Internal Medicine | Admitting: Internal Medicine

## 2023-03-20 DIAGNOSIS — J849 Interstitial pulmonary disease, unspecified: Secondary | ICD-10-CM | POA: Insufficient documentation

## 2023-03-20 NOTE — Progress Notes (Signed)
Daily Session Note  Patient Details  Name: Diane [name] MRN: [id number] Date of Birth: Oct 17, 1955 Referring Provider:   Doristine Devoid Pulmonary Rehab Walk Test from 01/12/2023 in [facility] for Heart, Vascular, & Lung Health  Referring Provider Ramaswamy       Encounter Date: 03/20/2023  Check In:  Session Check In - 03/20/23 1424       Check-In   Supervising physician immediately available to respond to emergencies [facility] immediately available    Physician(s) Carlyon Shadow, NP    [facility]    Staff Present Samantha Belarus, RD, LDN;Kaio Kuhlman Dionisio Paschal, ACSM-CEP, Exercise Physiologist;Kaylee Earlene Plater, MS, ACSM-CEP, Exercise Physiologist;David Manus Gunning, MS, ACSM-CEP, CCRP, Exercise Physiologist;Casey Katrinka Blazing, RT    Virtual Visit No    Medication changes reported     Yes    Comments stopped iron supplement    Fall or balance concerns reported    No    Tobacco Cessation No Change    Warm-up and Cool-down Performed as group-led instruction    Resistance Training Performed Yes    VAD Patient? No    PAD/SET Patient? No      Pain Assessment   Currently in Pain? No/denies    Multiple Pain Sites No             Capillary Blood Glucose: No results found for this or any previous visit (from the past 24 hour(s)).    Social History   Tobacco Use  Smoking Status Former   Packs/day: 0.50   Years: 30.00   Additional pack years: 0.00   Total pack years: 15.00   Types: Cigarettes   Quit date: 10/09/2021   Years since quitting: 1.4  Smokeless Tobacco Never  Tobacco Comments   5-10 cigarettes a day (plans to quit prior to this surgery 05/06/15)    Goals Met:  Independence with exercise equipment Exercise tolerated well No report of concerns or symptoms today Strength training completed today  Goals Unmet:  Not Applicable  Comments: Pt needed 15L on TM today. 8L on recumbent elliptical.  Service time is from 1311  to 1435.    Dr. Mechele Collin is Medical Director for Pulmonary Rehab at [facility].

## 2023-03-20 NOTE — Telephone Encounter (Signed)
FYI pt needed 15L on treadmill today in [place]. Generally requires 8-10L with exercise. She has an appt with you 03/22/23. Thx. Ethelda Chick BS, ACSM-CEP 03/20/2023 3:12 PM

## 2023-03-20 NOTE — Telephone Encounter (Signed)
Thanks Randi. Will address

## 2023-03-21 ENCOUNTER — Other Ambulatory Visit: Payer: Self-pay

## 2023-03-21 DIAGNOSIS — J849 Interstitial pulmonary disease, unspecified: Secondary | ICD-10-CM

## 2023-03-21 DIAGNOSIS — J441 Chronic obstructive pulmonary disease with (acute) exacerbation: Secondary | ICD-10-CM

## 2023-03-21 DIAGNOSIS — R0600 Dyspnea, unspecified: Secondary | ICD-10-CM

## 2023-03-21 NOTE — Patient Instructions (Signed)
ICD-10-CM   1. UIP (usual interstitial pneumonitis) (HCC)  J84.112     2. IPF (idiopathic pulmonary fibrosis) (HCC)  J84.112     3. Pulmonary emphysema, unspecified emphysema type (HCC)  J43.9     4. OSA (obstructive sleep apnea)  G47.33     5. 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  Plan 0 - -Refer to pulmonary rehabilitation in [place] or [place] -Completed current prednisone course -Continue Anoro -Continue oxygen for pulse ox goal greater than 88%  - need 8-10L [place] to do simple walks > 100 fet  - need home o2 concentator 10L -Continue pirfenidone -Check liver function test today 12/26/2022 and see me to do a standing order to get it repeated once a month for the next 4 months - -Spirometry and DLCO in 12 weeks  Followup  -Video visit with nurse practitioner Jairo Ben face-to-face visit nurse practitioner in 6 weeks to monitor pirfenidone uptake -12 weeks Dr. Marchelle Gearing 30-minute visit but after PFT

## 2023-03-21 NOTE — Progress Notes (Unsigned)
OV 11/13/2022 transfer of care to the ILD center with Dr. Marchelle Gearing.  Subjective:  Patient ID: Diane [name], female , DOB: June 07, 1956 , age 68 y.o. , MRN: [id number] , ADDRESS: [address] [place] [address] PCP Ignatius Specking, MD Patient Care Team: Ignatius Specking, MD as PCP - General (Internal Medicine) Jena Gauss Gerrit Friends, MD as Consulting Physician (Gastroenterology) Janalyn Harder, MD (Inactive) as Consulting Physician (Dermatology)  This Provider for this visit: Treatment Team:  Attending Provider: Kalman Shan, MD    11/13/2022 -   Chief Complaint  Patient presents with   New Patient (Initial Visit)    New pt from [facility] for ILD. Patient filled out packet and for Esbriet but never started the medication. CT is for April 2024. Pt is just getting over PNA. Still has a slight cough. Just finished prednisone and Levaquin.      HPI Diane [name] 67 y.o. -presents with her husband Diane [name].  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  She retire and then in the spring 2023 was diagnosed with right lower lobe non-small cell cancer stage II.  She is status post radiation and also chemotherapy.  Currently on observation treatment.  This is based on independent history from the husband and the patient and also review of the external records.  She is understandably quite frustrated with the onset of all these health issues given the fact she just retired.  Then approximately in September 2023 started noticing significant amount of dyspnea on exertion walking the dog and climbing stairs.  At this time desaturations with exercise to be noticed.  She has since been using oxygen and this helps her.  She is attended cardiac rehabilitation or pulmonary rehabilitation and this also helped her.  She was then given a diagnosis of pulmonary fibrosis/UIP.  Antifibrotic pirfenidone was started based on her history and also external record  review.  However due to some confusion of the paperwork this has not been started yet.  She is here for evaluation of her ILD.  Visualization of CT scan from 2015 shows presence of possible early ILD even back in 2015 with some honeycombing/emphysema at the base.  There was a craniocaudal gradient back pain.   Also of note she just went to a [place] cruise in mid January 2024.  When she came back on October 28, 2022.  Then on October 30, 2022 she had cough headaches no fever.  Her flu test and COVID was negative.  She is being treated for acute bronchitis with Levaquin and IM steroids and 6-day prednisone taper.  She will she was given a diagnosis of pneumonia actually.  She is almost back to baseline but still feels fatigued and still with some cough worse than her baseline. [facility] Comprehensive ILD Questionnaire  Symptoms:  0 she believes she has had much rapid worsening in the last 6 months only.  Suspect this is after her radiation.  Current symptom scores are as below.   Past Medical History :  -She was told 20 years ago she had hiatal hernia and acid reflux but she says she has had no issues since then.  It is definitely not described in the current CT chest -She is awaiting a sleep study -She had seizures 12 years ago. -She has hypertension controlled on medication -Currently just finished antibiotic and steroid course-has had COVID-vaccine but never had COVID    ROS:  -Tired and does have dry eyes -  Has acid reflux for which she is taking omeprazole  FAMILY HISTORY of LUNG DISEASE:  *-Mother had asthma but there is no family history of lung disease  PERSONAL EXPOSURE HISTORY:   -She smoked between 1973 and 2022.  Smoked 1 pack/day and then quit.  In the 1980s and 1990s very briefly very rarely she did some cocaine.  She smoked some marijuana between 1976 and 2022 very minimal.  HOME  EXPOSURE and HOBBY DETAILS :  [place]  The home  is 66 years old.  She is lived there for 19 years.  She recently got her down comforter 3 months ago but otherwise no organic antigen exposure in the house.  Although there is a leaky roof for the last 1 year in 2021 but there is no mold.  She is retired.  She works at [employer].  She works in [employer].  She did desk job.  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  INVESTIGATIONS: -As below    HRCT dec 2023 -personally visualized and independently agree  COMPARISON:  Chest CT 06/27/2022.   FINDINGS: Cardiovascular: Heart size is normal. There is no significant pericardial fluid, thickening or pericardial calcification. There is aortic atherosclerosis, as well as atherosclerosis of the great vessels of the mediastinum and the coronary arteries, including calcified atherosclerotic plaque in the left main, left anterior descending and right coronary arteries.   Mediastinum/Nodes: No pathologically enlarged mediastinal or left hilar lymph nodes. Soft tissue fullness in the right hilar region concerning for underlying lymphadenopathy, but poorly evaluated on today's noncontrast CT examination (best appreciated on axial image 60 of series 2). Esophagus is unremarkable in appearance. No axillary lymphadenopathy.   Lungs/Pleura: Treated lesion in the right lower lobe is now partially obscured by surrounding parenchymal changes, but is estimated to measure approximately 2.1 x 1.4 cm (axial image 114 of series 4). There has been a dramatic increase in surrounding areas of ground-glass attenuation, septal thickening, bronchiectasis and cystic changes, and regional architectural distortion in the surrounding lung parenchyma, presumably reflective of evolving postradiation changes. Several nodular areas of  architectural distortion are now noted in the right lung, most notably a macrolobulated nodule with spiculated margins in the right upper lobe (axial image 86 of series 4) measuring 1.9 x 1.3 cm, and a slightly ill-defined 1.9 x 1.1 cm nodular area of architectural distortion in the right lower lobe at the base (axial image 185 of series 4). Small right pleural effusion predominantly lying dependently. No left pleural effusion. In the remaining portions of the lungs are widespread areas of septal thickening, subpleural reticulation, traction bronchiectasis, peripheral bronchiolectasis and honeycombing, most evident in the mid to lower lung distribution. Inspiratory and expiratory imaging is unremarkable. There is also a background of mild diffuse bronchial wall thickening with moderate centrilobular and paraseptal emphysema.   Upper Abdomen: Aortic atherosclerosis.  Status post cholecystectomy.   Musculoskeletal: There are no aggressive appearing lytic or blastic lesions noted in the visualized portions of the skeleton.  IMPRESSION: 1. The appearance of the lungs is compatible with Spoke with the pt's spouse and scheduled appt for 12/20/22 at 1:30 pm. rior study. 2. Evolving postradiation changes in the right lung. Treated nodule in the superior segment of the right lower lobe appears grossly similar to the prior study, while there are new nodules  in the right lung, most concerning of which is in the right upper lobe (axial image 86 of series 4), currently measuring 1.9 x 1.3 cm. Close attention on follow-up studies is recommended. These new areas of nodularity could be treatment related or infectious or inflammatory in etiology, however, neoplasm is not excluded. 3. Mild diffuse bronchial wall thickening with moderate centrilobular and paraseptal emphysema; imaging findings suggestive of underlying COPD. 4. Aortic atherosclerosis, in addition to left main and 2 vessel coronary  artery disease. Please note that although the presence of coronary artery calcium documents the presence of coronary artery disease, the severity of this disease and any potential stenosis cannot be assessed on this non-gated CT examination. Assessment for potential risk factor modification, dietary therapy or pharmacologic therapy may be warranted, if clinically indicated.   Aortic Atherosclerosis (ICD10-I70.0) and Emphysema (ICD10-J43.9).     Electronically Signed   By: Trudie Reed M.D.   On: 09/27/2022 07:43   Telephone with Huntley Dec 12/13/22 tient called the office 12/13/2022  to get her sleep study results.  While I was talking with the patient she was short of breath and shared that she had increased oxygen demands. She said this has been going on for several weeks since she returned from  a cruise she took with friends to celebrate finishing chemo and radiation . This was a [place] cruise in January 2024. She returned October 28, 2022. Then on October 30, 2022 she had cough headaches no fever. Her flu test and COVID was negative. She was treated for acute bronchitis and pneumonia  with Levaquin and IM steroids and 6-day prednisone taper.  She was feeling better but still fatigued and had a and still with some cough worse than her baseline  when she saw Dr. Marchelle Gearing  11/13/2022.  I ordered a CXR at [facility] (Closer to patient ).I also ordered a home tank with a higher liter flow capacity as her oxygen demand was so much higher.. CXR Results 2/28 confirmed >>  Diffuse bilateral interstitial pulmonary opacity, increased compared to prior examination, consistent with edema or infection. No focal airspace opacity. Unchanged post treatment appearance of the right midlung.I called the patient with the results of her scan and sent in prescriptions for Doxycycline 100 mg BID x 7 days and a prednisone taper Prednisone taper; 10 mg tablets: 4 tabs x 2 days, 3 tabs x 2 days, 2 tabs x 2 days 1 tab x 2  days then stop. She has a penicillin allergy, and history of QT prolongation .  I  asked her to start Mucinex 1200 mg each morning with a glass of water and start using her flutter valve. She states her secretions are thick and hard to get up. I have asked her to use her albuterol nebs in the morning and evening while she is sick, and to wear her oxygen at bedtime at 2  liters. When I asked if she has had a fever, she said she did not think so, but when I asked if she had chills , she said she has had a lot of couch time covered in blankets, so endorses that she may have had fever. I asked Olegario Messier to check in with me Friday 12/15/2022 to ensure she was improving. We discussed that if she does not start turning around soon, we may need to consider IV antibiotics. We discussed if she gets worse , not better to call the office  or seek emergency care.  I reviewed her last  echo from 11/15/2022, which shows EF of 60-65%, LV normal function, Left ventricular diastolic parameters are consistent with Grade I diastolic  dysfunction. She denied any lower extremity edema. She does have a follow up CT Chest scheduled 01/25/2023 per oncology,I plan to  order one earlier if she does not improve.    12/20/2022 -office with Kandice Robinsons  Pt. Presents for short term follow up of multifocal pneumonia noted on CXR 12/13/2022. She was treated with Doxycycline and prednisone taper 12/13/2022 for her slow to resolve pneumonia . She states she is feeling much better. She states it is like night and day. She states she is back to her baseline. She is using 3 L [place] at rest and 4 L with exertion. Her oxygen demand is back to her baseline and she has her energy back. She does still have a cough, which is productive . She coughed in the office and she had some blood streaks in it. She states this is new, and she thinks it is from coughing. Secretions are mostly clear with some tan bits. She denies fever or chest pain. She has completed the  Doxycycline and has her last dose of prednisone tomorrow. I will give her an additional 7 days of 10 mg prednisone daily to cover her until she sees Dr. Marchelle Gearing next  week.  She started perfididone Monday 12/18/2022. I have told her to let us know if she has any stomach upset.  She is much more compliant with her oxygen use. She had her oxygen on today in the office. She is monitoring her oxygen levels. She is compliant with her Anoro daily. She is using her albuterol as needed. She has been using her Mucinex with flutter valve for secretion mobilization.   She does have headaches, which I think are from her cough. I have prescribed her cough medication with Codeine to see if that resolves the headaches.   She is still waiting in her CPAP machine and her 10 L home oxygen concentrator. My nurse will check with [employer] about the delay.    OV 12/26/2022  Subjective:  Patient ID: Diane [name], female , DOB: Feb 16, 1956 , age 56 y.o. , MRN: [id number] , ADDRESS: [address] [place] [address] PCP Ignatius Specking, MD Patient Care Team: Ignatius Specking, MD as PCP - General (Internal Medicine) Jena Gauss Gerrit Friends, MD as Consulting Physician (Gastroenterology) Janalyn Harder, MD (Inactive) as Consulting Physician (Dermatology)  This Provider for this visit: Treatment Team:  Attending Provider: Kalman Shan, MD    12/26/2022 -   Chief Complaint  Patient presents with   Follow-up    PFT F/up   r  HPI Diane [name] 67 y.o. -returns for follow-up.  She presents with her husband.  She tells me that after she saw me she got the prednisone taper and felt transiently better but then when she had a phone call with Jairo Ben it was noticed that she was quite symptomatic.  She was then called in for an acute visit and given 7-10 days of daily prednisone along with an antibiotic.  This then has helped her come back to baseline.  She feels she is currently at baseline.  At the time it  was noticed that she was desaturating easily and she needed significant amount of oxygen to correct.  Her home oxygen concentrator only go up to 5 L.  Therefore we did a oxygen titration test today and she actually needs 8-10 L to correct and walk  our standard 3 laps in the office.  Nevertheless she feels closer to baseline.  She still has a lot of fatigue and shortness of breath and she wants to improve on this.  She is open and willing to attend pulmonary rehabilitation.  Apparently referral has been made by me and by nurse practitioner but she still has not heard.  I did indicate to her we will make another referral again.  She also told me that she is now touch base with Army Chaco the support group chair and she is enjoying those conversations.  She had pulmonary function test that compared to a year ago shows significant decline at least 16% and the FVC decline in a year.  She just started Esbriet for this and as of yesterday has gone to 3 pills 3 times daily.  She is to have an echo and pulmonary artery pressures are reported as normal  Other than fatigue which she does not believe is due to pirfenidone she is actually tolerating the pirfenidone quite well.      OV 03/21/2023  Subjective:  Patient ID: Diane [name], female , DOB: November 08, 1955 , age 89 y.o. , MRN: [id number] , ADDRESS: [address] [place] [address] PCP Ignatius Specking, MD Patient Care Team: Ignatius Specking, MD as PCP - General (Internal Medicine) Jena Gauss Gerrit Friends, MD as Consulting Physician (Gastroenterology) Janalyn Harder, MD (Inactive) as Consulting Physician (Dermatology)  This Provider for this visit: Treatment Team:  Attending Provider: Kalman Shan, MD  #IPF/UIP pattern #Associated pulmonary emphysema #Combined pulmonary fibrosis emphysema #Lung cance  03/21/2023 -  No chief complaint on file.    HPI Kallen Mruk 67 y.o. -   SYMPTOM SCALE - ILD 11/13/2022  Current weight   O2 use  Exertional walking the dog.  Shortness of Breath 0 -> 5 scale with 5 being worst (score 6 If unable to do)  At rest 0  Simple tasks - showers, clothes change, eating, shaving 3  Household (dishes, doing bed, laundry) 3  Shopping 2  Walking level at own pace 4  Walking up Stairs 4  Total (30-36) Dyspnea Score 16      Non-dyspnea symptoms (0-> 5 scale) 11/13/2022  How bad is your cough? 2 -cough is better after quitting smoking  How bad is your fatigue 2  How bad is nausea 0  How bad is vomiting?  0  How bad is diarrhea? 0  How bad is anxiety? 3  How bad is depression 1  Any chronic pain - if so where and how bad 0    Simple office walk 185 feet x  3 laps goal with forehead probe 12/26/2022  12/26/2022   O2 used RA   Number laps completed 1/2 lap of 1 ap   Comments about pace normal   Resting Pulse Ox/HR 94% and 90/min   Final Pulse Ox/HR 87% and 90/min   Desaturated </= 88% yes   Desaturated <= 3% points yes   Got Tachycardic >/= 90/min yes   Symptoms at end of test dyspnea   Miscellaneous comments Waked on 5L [place] - 96% sitting -> still deats Needed 8-10L to correct and walk all 3 laps    PFT     Latest Ref Rng & Units 12/26/2022    1:53 PM 01/04/2022    9:51 AM  PFT Results  FVC-Pre L 2.10  2.54   FVC-Predicted Pre % 64  76   FVC-Post L 2.11  2.69  FVC-Predicted Post % 64  81   Pre FEV1/FVC % % 83  73   Post FEV1/FCV % % 81  71   FEV1-Pre L 1.74  1.84   FEV1-Predicted Pre % 69  72   FEV1-Post L 1.70  1.91   DLCO uncorrected ml/min/mmHg 6.99  9.15   DLCO UNC% % 34  44   DLCO corrected ml/min/mmHg 6.99  9.15   DLCO COR %Predicted % 34  44   DLVA Predicted % 55  55   TLC L 3.68  3.94   TLC % Predicted % 70  75   RV % Predicted % 74  62        has a past medical history of Anxiety, Arthritis, C. difficile diarrhea (2015), COPD (chronic obstructive pulmonary disease) (HCC), Depression, Eczema, H pylori ulcer, Headache(784.0), Hypercholesterolemia,  Hypertension, Kidney stones, Pneumonia, Seizures (HCC), and Shingles.   reports that she quit smoking about 17 months ago. Her smoking use included cigarettes. She has a 15.00 pack-year smoking history. She has never used smokeless tobacco.  Past Surgical History:  Procedure Laterality Date   ABDOMINAL HYSTERECTOMY     ADENOIDECTOMY     APPENDECTOMY     BRONCHIAL BIOPSY  12/20/2021   Procedure: BRONCHIAL BIOPSIES;  Surgeon: Josephine Igo, DO;  Location: [facility];  Service: Pulmonary;;   BRONCHIAL BRUSHINGS  12/20/2021   Procedure: BRONCHIAL BRUSHINGS;  Surgeon: Josephine Igo, DO;  Location: [facility];  Service: Pulmonary;;   BRONCHIAL NEEDLE ASPIRATION BIOPSY  12/20/2021   Procedure: BRONCHIAL NEEDLE ASPIRATION BIOPSIES;  Surgeon: Josephine Igo, DO;  Location: [facility];  Service: Pulmonary;;   CESAREAN SECTION     CHOLECYSTECTOMY     COLONOSCOPY  06/10/2012   RMR: Colonic polyps -removed as described above.    FINE NEEDLE ASPIRATION  12/20/2021   Procedure: FINE NEEDLE ASPIRATION (FNA) LINEAR;  Surgeon: Josephine Igo, DO;  Location: [facility];  Service: Pulmonary;;   KNEE ARTHROSCOPY  1973   right knee,  torn cart   LUMBAR DISC SURGERY  05/12/2015   L4 L5   LUMBAR LAMINECTOMY/DECOMPRESSION MICRODISCECTOMY Left 05/12/2015   Procedure: Left L4-5 Microdiscectomy;  Surgeon: Eldred Manges, MD;  Location: [facility];  Service: Orthopedics;  Laterality: Left;   TONSILLECTOMY     VIDEO BRONCHOSCOPY WITH ENDOBRONCHIAL ULTRASOUND Bilateral 12/20/2021   Procedure: VIDEO BRONCHOSCOPY WITH ENDOBRONCHIAL ULTRASOUND;  Surgeon: Josephine Igo, DO;  Location: [facility];  Service: Pulmonary;  Laterality: Bilateral;   VIDEO BRONCHOSCOPY WITH RADIAL ENDOBRONCHIAL ULTRASOUND  12/20/2021   Procedure: RADIAL ENDOBRONCHIAL ULTRASOUND;  Surgeon: Josephine Igo, DO;  Location: [facility];  Service: Pulmonary;;    Allergies  Allergen Reactions   Alpha-Gal     Other reaction(s): Abdominal  Pain   Other Other (See Comments)   Penicillins     Unknown type of reaction    Immunization History  Administered Date(s) Administered   Fluad Quad(high Dose 65+) 08/08/2022   Influenza, Quadrivalent, Recombinant, Inj, Pf 07/24/2019   Influenza,inj,Quad PF,6+ Mos 07/15/2016, 08/03/2017, 08/29/2018   Influenza-Unspecified 07/16/2021   Moderna Sars-Covid-2 Vaccination 12/25/2019, 01/24/2020   PNEUMOCOCCAL CONJUGATE-20 08/08/2022   Pneumococcal Polysaccharide-23 09/29/2013   RSV,unspecified 09/14/2022    Family History  Problem Relation Age of Onset   Arthritis Mother    Asthma Mother    Depression Mother    Hyperlipidemia Mother    Varicose Veins Mother    Arthritis Father    Heart disease Father    Hyperlipidemia  Father    Vision loss Father    Cancer Sister    COPD Sister    Early death Sister    Cancer Brother    Alcohol abuse Maternal Grandfather    Colon cancer Neg Hx      Current Outpatient Medications:    albuterol (PROVENTIL) (5 MG/ML) 0.5% nebulizer solution, Take 2.5 mg by nebulization every 6 (six) hours as needed for wheezing or shortness of breath., Disp: , Rfl:    albuterol (VENTOLIN HFA) 108 (90 Base) MCG/ACT inhaler, Inhale 2 puffs into the lungs every 6 (six) hours as needed for wheezing or shortness of breath., Disp: 8 g, Rfl: 2   ALPRAZolam (XANAX) 0.5 MG tablet, Take 0.5 mg by mouth daily as needed., Disp: , Rfl:    Budeson-Glycopyrrol-Formoterol (BREZTRI AEROSPHERE) 160-9-4.8 MCG/ACT AERO, Inhale 2 puffs into the lungs in the morning and at bedtime., Disp: 2 each, Rfl: 0   cetirizine (ZYRTEC) 10 MG tablet, Take 10 mg by mouth at bedtime., Disp: , Rfl:    EPINEPHrine (AUVI-Q) 0.3 mg/0.3 mL IJ SOAJ injection, Inject 0.3 mLs (0.3 mg total) into the muscle as needed for anaphylaxis., Disp: 2 each, Rfl: 1   fluticasone (FLONASE) 50 MCG/ACT nasal spray, Place 2 sprays into both nostrils daily. (Patient taking differently: Place 2 sprays into both nostrils  daily as needed for allergies.), Disp: 16 g, Rfl: 5   hydrochlorothiazide (MICROZIDE) 12.5 MG capsule, Take 12.5 mg by mouth daily., Disp: , Rfl:    omeprazole (PRILOSEC) 20 MG capsule, Take 1 capsule (20 mg total) by mouth 2 (two) times daily. (Needs to be seen before next refill), Disp: 60 capsule, Rfl: 0   OVER THE COUNTER MEDICATION, Take 1 capsule by mouth daily. Probiotic, Disp: , Rfl:    Pirfenidone (ESBRIET) 267 MG TABS, Take 3 tablets (801 mg total) by mouth 3 (three) times daily with meals. (Account: [email or web address]), Disp: 270 tablet, Rfl: 4   rosuvastatin (CRESTOR) 10 MG tablet, Take 10 mg by mouth at bedtime., Disp: , Rfl:    umeclidinium-vilanterol (ANORO ELLIPTA) 62.5-25 MCG/ACT AEPB, Inhale 1 puff into the lungs daily., Disp: 60 each, Rfl: 3      Objective:   There were no vitals filed for this visit.  Estimated body mass index is 22.17 kg/m as calculated from the following:   Height as of 02/06/23: 5\' 6"  (1.676 m).   Weight as of 03/13/23: 137 lb 5.6 oz (62.3 kg).  @WEIGHTCHANGE @  There were no vitals filed for this visit.   Physical Exam   General: No distress. *** O2 at rest: *** Cane present: *** Sitting in wheel chair: *** Frail: *** Obese: *** Neuro: Alert and Oriented x 3. GCS 15. Speech normal Psych: Pleasant Resp:  Barrel Chest - ***.  Wheeze - ***, Crackles - ***, No overt respiratory distress CVS: Normal heart sounds. Murmurs - *** Ext: Stigmata of Connective Tissue Disease - *** HEENT: Normal upper airway. PEERL +. No post nasal drip        Assessment:     No diagnosis found.     Plan:     There are no Patient Instructions on file for this visit.    SIGNATURE    Dr. Kalman Shan, M.D., F.C.C.P,  Pulmonary and Critical Care Medicine Staff Physician, [facility] Director - Interstitial Lung Disease  Program  Pulmonary Fibrosis [facility] at [facility] [place] [facility], [place],  [address]  Pager: [phone], If no answer  or between  15:00h - 7:00h: call [phone] Telephone: [phone]  11:22 PM 03/21/2023

## 2023-03-22 ENCOUNTER — Ambulatory Visit (INDEPENDENT_AMBULATORY_CARE_PROVIDER_SITE_OTHER): Payer: Medicare Other

## 2023-03-22 ENCOUNTER — Encounter (HOSPITAL_COMMUNITY): Payer: Medicare Other

## 2023-03-22 ENCOUNTER — Encounter: Payer: Self-pay | Admitting: Internal Medicine

## 2023-03-22 ENCOUNTER — Ambulatory Visit (INDEPENDENT_AMBULATORY_CARE_PROVIDER_SITE_OTHER): Payer: Medicare Other | Admitting: Internal Medicine

## 2023-03-22 ENCOUNTER — Ambulatory Visit: Payer: Medicare Other | Admitting: Internal Medicine

## 2023-03-22 VITALS — BP 112/70 | HR 85 | Temp 97.4°F | Ht 66.0 in | Wt 137.5 lb

## 2023-03-22 DIAGNOSIS — Z66 Do not resuscitate: Secondary | ICD-10-CM

## 2023-03-22 DIAGNOSIS — R0600 Dyspnea, unspecified: Secondary | ICD-10-CM | POA: Diagnosis not present

## 2023-03-22 DIAGNOSIS — J439 Emphysema, unspecified: Secondary | ICD-10-CM | POA: Diagnosis not present

## 2023-03-22 DIAGNOSIS — J84112 Idiopathic pulmonary fibrosis: Secondary | ICD-10-CM

## 2023-03-22 DIAGNOSIS — Z85118 Personal history of other malignant neoplasm of bronchus and lung: Secondary | ICD-10-CM

## 2023-03-22 DIAGNOSIS — J441 Chronic obstructive pulmonary disease with (acute) exacerbation: Secondary | ICD-10-CM

## 2023-03-22 DIAGNOSIS — J849 Interstitial pulmonary disease, unspecified: Secondary | ICD-10-CM | POA: Diagnosis not present

## 2023-03-22 DIAGNOSIS — Z7189 Other specified counseling: Secondary | ICD-10-CM

## 2023-03-22 DIAGNOSIS — J9611 Chronic respiratory failure with hypoxia: Secondary | ICD-10-CM

## 2023-03-22 DIAGNOSIS — R053 Chronic cough: Secondary | ICD-10-CM | POA: Diagnosis not present

## 2023-03-22 LAB — PULMONARY FUNCTION TEST
DL/VA % pred: 45 %
DL/VA: 1.86 ml/min/mmHg/L
DLCO cor % pred: 14 %
DLCO cor: 3.04 ml/min/mmHg
DLCO unc % pred: 13 %
DLCO unc: 2.82 ml/min/mmHg
FEF 25-75 Pre: 1.3 L/sec
FEF2575-%Pred-Pre: 57 %
FEV1-%Pred-Pre: 56 %
FEV1-Pre: 1.47 L
FEV1FVC-%Pred-Pre: 103 %
FEV6-%Pred-Pre: 56 %
FEV6-Pre: 1.85 L
FEV6FVC-%Pred-Pre: 103 %
FVC-%Pred-Pre: 54 %
FVC-Pre: 1.85 L
Pre FEV1/FVC ratio: 79 %
Pre FEV6/FVC Ratio: 100 %

## 2023-03-22 MED ORDER — HYDROCODONE BIT-HOMATROP MBR 5-1.5 MG/5ML PO SOLN: 5.0000 mL | Freq: Four times a day (QID) | ORAL | 0 refills | Status: DC | PRN

## 2023-03-22 NOTE — Progress Notes (Signed)
Spiro/DLCO performed today.  

## 2023-03-22 NOTE — Patient Instructions (Signed)
Spiro/DLCO performed today.  

## 2023-03-23 LAB — HEPATIC FUNCTION PANEL
ALT: 15 U/L (ref 0–35)
AST: 21 U/L (ref 0–37)
Albumin: 4.2 g/dL (ref 3.5–5.2)
Alkaline Phosphatase: 68 U/L (ref 39–117)
Bilirubin, Direct: 0 mg/dL (ref 0.0–0.3)
Total Bilirubin: 0.3 mg/dL (ref 0.2–1.2)
Total Protein: 8.7 g/dL — ABNORMAL HIGH (ref 6.0–8.3)

## 2023-03-23 LAB — CBC WITH DIFFERENTIAL/PLATELET
Basophils Absolute: 0.1 10*3/uL (ref 0.0–0.1)
Basophils Relative: 0.9 % (ref 0.0–3.0)
Eosinophils Absolute: 0.2 10*3/uL (ref 0.0–0.7)
Eosinophils Relative: 3.2 % (ref 0.0–5.0)
HCT: 32.8 % — ABNORMAL LOW (ref 36.0–46.0)
Hemoglobin: 10.7 g/dL — ABNORMAL LOW (ref 12.0–15.0)
Lymphocytes Relative: 20.7 % (ref 12.0–46.0)
Lymphs Abs: 1.4 10*3/uL (ref 0.7–4.0)
MCHC: 32.8 g/dL (ref 30.0–36.0)
MCV: 92.3 fl (ref 78.0–100.0)
Monocytes Absolute: 0.5 10*3/uL (ref 0.1–1.0)
Monocytes Relative: 7.3 % (ref 3.0–12.0)
Neutro Abs: 4.5 10*3/uL (ref 1.4–7.7)
Neutrophils Relative %: 67.9 % (ref 43.0–77.0)
Platelets: 365 10*3/uL (ref 150.0–400.0)
RBC: 3.55 Mil/uL — ABNORMAL LOW (ref 3.87–5.11)
RDW: 12.9 % (ref 11.5–15.5)
WBC: 6.6 10*3/uL (ref 4.0–10.5)

## 2023-03-23 LAB — SEDIMENTATION RATE: Sed Rate: 80 mm/hr — ABNORMAL HIGH (ref 0–30)

## 2023-03-23 LAB — D-DIMER, QUANTITATIVE: D-Dimer, Quant: 0.67 mcg/mL FEU — ABNORMAL HIGH (ref <0.50)

## 2023-03-23 LAB — TROPONIN I: Troponin I: <3 ng/L (ref <=47)

## 2023-03-23 LAB — BRAIN NATRIURETIC PEPTIDE: Pro B Natriuretic peptide (BNP): 58 pg/mL (ref 0.0–100.0)

## 2023-03-26 ENCOUNTER — Ambulatory Visit (HOSPITAL_BASED_OUTPATIENT_CLINIC_OR_DEPARTMENT_OTHER)
Admission: RE | Admit: 2023-03-26 | Discharge: 2023-03-26 | Disposition: A | Discharge status: Home / Self Care | Payer: Medicare Other | Source: Ambulatory Visit | Attending: Internal Medicine | Admitting: Internal Medicine

## 2023-03-26 ENCOUNTER — Telehealth: Payer: Self-pay | Admitting: Internal Medicine

## 2023-03-26 ENCOUNTER — Other Ambulatory Visit: Payer: Self-pay

## 2023-03-26 DIAGNOSIS — R0600 Dyspnea, unspecified: Secondary | ICD-10-CM | POA: Insufficient documentation

## 2023-03-26 DIAGNOSIS — Z515 Encounter for palliative care: Secondary | ICD-10-CM

## 2023-03-26 MED ORDER — IOHEXOL 350 MG/ML SOLN
100.0000 mL | Freq: Once | INTRAVENOUS | Status: AC | PRN
  Administered 2023-03-26: 75 mL via INTRAVENOUS

## 2023-03-26 NOTE — Telephone Encounter (Signed)
    D-ddimer slightly high along with ESR 80 which is very igh. Anemia slight ly wore. Need to rule out PE. If PE expoect it to be small because BNP/TRop are normal  Plan  -get CTA chest 03/26/2023  - if PE negtive, then can cnsider steroids

## 2023-03-26 NOTE — Telephone Encounter (Signed)
Spoke with the pt's spouse ok per DPR and notified of results and recommendations from Dr Marchelle Gearing He verbalized understanding  CTA ordered STAT

## 2023-03-27 ENCOUNTER — Encounter (HOSPITAL_COMMUNITY): Payer: Self-pay

## 2023-03-27 ENCOUNTER — Encounter (HOSPITAL_COMMUNITY)
Admission: RE | Admit: 2023-03-27 | Discharge: 2023-03-27 | Disposition: A | Discharge status: Home / Self Care | Payer: Medicare Other | Source: Ambulatory Visit | Attending: Internal Medicine | Admitting: Internal Medicine

## 2023-03-27 ENCOUNTER — Telehealth: Payer: Self-pay | Admitting: Internal Medicine

## 2023-03-27 VITALS — Wt 137.3 lb

## 2023-03-27 DIAGNOSIS — J849 Interstitial pulmonary disease, unspecified: Secondary | ICD-10-CM | POA: Diagnosis not present

## 2023-03-27 DIAGNOSIS — J449 Chronic obstructive pulmonary disease, unspecified: Secondary | ICD-10-CM

## 2023-03-27 DIAGNOSIS — J479 Bronchiectasis, uncomplicated: Secondary | ICD-10-CM

## 2023-03-27 DIAGNOSIS — C3490 Malignant neoplasm of unspecified part of unspecified bronchus or lung: Secondary | ICD-10-CM

## 2023-03-27 NOTE — Progress Notes (Signed)
Daily Session Note  Patient Details  Name: Diane [name] MRN: [id number] Date of Birth: 01-06-1956 Referring Provider:   Doristine Devoid Pulmonary Rehab Walk Test from 01/12/2023 in [facility]  Referring Provider Ramaswamy       Encounter Date: 03/27/2023  Check In:  Session Check In - 03/27/23 1416       Check-In   Supervising physician immediately available to respond to emergencies [facility] MD immediately available    Physician(s) Jari Favre, PA    Location [facility]    Staff Present Elissa Lovett BS, ACSM-CEP, Exercise Physiologist;Kaylee Earlene Plater, MS, ACSM-CEP, Exercise Physiologist;Casey Robina Ade, RN, MSN;Eugenio Dollins, RN, BSN    Virtual Visit No    Medication changes reported     No    Comments stopped iron supplement    Fall or balance concerns reported    No    Tobacco Cessation No Change    Warm-up and Cool-down Performed as group-led instruction    Resistance Training Performed Yes    VAD Patient? No    PAD/SET Patient? No      Pain Assessment   Currently in Pain? No/denies    Pain Score 0-No pain    Multiple Pain Sites No             Capillary Blood Glucose: No results found for this or any previous visit (from the past 24 hour(s)).   Exercise Prescription Changes - 03/27/23 1500       Response to Exercise   Blood Pressure (Admit) 94/52    Blood Pressure (Exercise) 108/60    Blood Pressure (Exit) 100/54    Heart Rate (Admit) 84 bpm    Heart Rate (Exercise) 108 bpm    Heart Rate (Exit) 93 bpm    Oxygen Saturation (Admit) 100 %   4L   Oxygen Saturation (Exercise) 91 %   8L   Oxygen Saturation (Exit) 91 %   4L   Rating of Perceived Exertion (Exercise) 12    Perceived Dyspnea (Exercise) 2    Duration Continue with 30 min of aerobic exercise without signs/symptoms of physical distress.    Intensity THRR unchanged      Progression   Progression Continue to  progress workloads to maintain intensity without signs/symptoms of physical distress.      Resistance Training   Training Prescription Yes    Weight black bands    Reps 10-15    Time 10 Minutes      Oxygen   Oxygen Continuous    Liters 4-8L      Treadmill   MPH 3    Grade 3    Minutes 15    METs 4.54      Recumbant Elliptical   Level 3    Minutes 15    METs 4.1      Oxygen   Maintain Oxygen Saturation 88% or higher             Social History   Tobacco Use  Smoking Status Former   Packs/day: 0.50   Years: 30.00   Additional pack years: 0.00   Total pack years: 15.00   Types: Cigarettes   Quit date: 10/09/2021   Years since quitting: 1.4  Smokeless Tobacco Never  Tobacco Comments   5-10 cigarettes a day (plans to quit prior to this surgery 05/06/15)    Goals Met:  Independence with exercise equipment Exercise tolerated well No report of  concerns or symptoms today Strength training completed today  Goals Unmet:  Not Applicable  Comments: Service time is from 1310 to 1445    Dr. Mechele Collin is Medical Director for Pulmonary Rehab at [facility].

## 2023-03-27 NOTE — Telephone Encounter (Signed)
   Let patient know that there is no evidence of PE but radiologist concerned about cancer with increased nodule in the right lower lobe.  She needs to talk to Dr. Arbutus Ped about it. MPRESSION: No evidence of pulmonary embolism.   Increased size of 1.9 cm nodular opacity in posterior right lower lobe, suspicious for carcinoma.   No significant change in areas of right lung airspace disease and bronchiectasis, likely due to post radiation changes.   Chronic pulmonary emphysema and fibrosis.   Aortic Atherosclerosis (ICD10-I70.0) and Emphysema (ICD10-J43.9).     Electronically Signed   By: Dietrich Pates.D.

## 2023-03-27 NOTE — Progress Notes (Signed)
COMMUNITY PALLIATIVE CARE SW NOTE  PATIENT NAME: Diane [name] DOB: April 18, 1956 MRN: [id number]  PRIMARY CARE PROVIDER: Ignatius Specking, MD  RESPONSIBLE PARTY:  Acct ID - Guarantor Home Phone Work Phone Relationship Acct Type  [id number] Sarajane Marek* [phone]  Self P/F     [address], [place], [place] [address]   Initial Palliative Care Encounter/Clinical Social Work  [employer] SW completed an initial telephonic encounter with patient. SW provided education to her regarding palliative care services, visit frequency and support provided. Patient provided an initial consent to services and a status update.  Patient stated that she is waiting on the results of a multitude of tests that she recently took, that she finds frustrating. She report that her appetite is poor. She is eating at least 1-2 small meals per day. Her current weight is 136 lbs, which is down from 150lbs in February. She is going to PT 2x week. She report having pain to her chest that she contributes to stress. Patient is on continuous o2, 4L at rest and up to 8L with activity. She ambulates independently and she is independent for all ADL's.   A follow-up visit is scheduled for 04/09/23 @ 3 pm.    Social History   Tobacco Use   Smoking status: Former    Packs/day: 0.50    Years: 30.00    Additional pack years: 0.00    Total pack years: 15.00    Types: Cigarettes    Quit date: 10/09/2021    Years since quitting: 1.4   Smokeless tobacco: Never   Tobacco comments:    5-10 cigarettes a day (plans to quit prior to this surgery 05/06/15)  Substance Use Topics   Alcohol use: Yes    Alcohol/week: 3.0 standard drinks of alcohol    Types: 3 Shots of liquor per week    Comment: 2x week    CODE STATUS: To be assessed ADVANCED DIRECTIVES: No MOST FORM COMPLETE: No HOSPICE EDUCATION PROVIDED: No  Duration of encounter and documentation: 30 minutes  [employer], LCSW

## 2023-03-28 NOTE — Progress Notes (Signed)
Pulmonary Individual Treatment Plan  Patient Details  Name: Diane [name] MRN: [id number] Date of Birth: 1955/12/23 Referring Provider:   Doristine Devoid Pulmonary Rehab Walk Test from 01/12/2023 in [facility]  Referring Provider Ramaswamy       Initial Encounter Date:  Flowsheet Row Pulmonary Rehab Walk Test from 01/12/2023 in [facility]  Date 01/12/23       Visit Diagnosis: ILD (interstitial lung disease) (HCC)  Patient's Home Medications on Admission:   Current Outpatient Medications:    albuterol (PROVENTIL) (5 MG/ML) 0.5% nebulizer solution, Take 2.5 mg by nebulization every 6 (six) hours as needed for wheezing or shortness of breath., Disp: , Rfl:    albuterol (VENTOLIN HFA) 108 (90 Base) MCG/ACT inhaler, Inhale 2 puffs into the lungs every 6 (six) hours as needed for wheezing or shortness of breath., Disp: 8 g, Rfl: 2   ALPRAZolam (XANAX) 0.5 MG tablet, Take 0.5 mg by mouth daily as needed., Disp: , Rfl:    Budeson-Glycopyrrol-Formoterol (BREZTRI AEROSPHERE) 160-9-4.8 MCG/ACT AERO, Inhale 2 puffs into the lungs in the morning and at bedtime., Disp: 2 each, Rfl: 0   cetirizine (ZYRTEC) 10 MG tablet, Take 10 mg by mouth at bedtime., Disp: , Rfl:    EPINEPHrine (AUVI-Q) 0.3 mg/0.3 mL IJ SOAJ injection, Inject 0.3 mLs (0.3 mg total) into the muscle as needed for anaphylaxis., Disp: 2 each, Rfl: 1   fluticasone (FLONASE) 50 MCG/ACT nasal spray, Place 2 sprays into both nostrils daily. (Patient taking differently: Place 2 sprays into both nostrils daily as needed for allergies.), Disp: 16 g, Rfl: 5   HYDROcodone bit-homatropine (HYCODAN) 5-1.5 MG/5ML syrup, Take 5 mLs by mouth every 6 (six) hours as needed for cough., Disp: 240 mL, Rfl: 0   omeprazole (PRILOSEC) 20 MG capsule, Take 1 capsule (20 mg total) by mouth 2 (two) times daily. (Needs to be seen before next  refill), Disp: 60 capsule, Rfl: 0   OVER THE COUNTER MEDICATION, Take 1 capsule by mouth daily. Probiotic, Disp: , Rfl:    Pirfenidone (ESBRIET) 267 MG TABS, Take 3 tablets (801 mg total) by mouth 3 (three) times daily with meals. (Account: [email or web address]), Disp: 270 tablet, Rfl: 4   rosuvastatin (CRESTOR) 10 MG tablet, Take 10 mg by mouth at bedtime., Disp: , Rfl:    umeclidinium-vilanterol (ANORO ELLIPTA) 62.5-25 MCG/ACT AEPB, Inhale 1 puff into the lungs daily., Disp: 60 each, Rfl: 3  Past Medical History: Past Medical History:  Diagnosis Date   Anxiety    Arthritis    fingers, left foot   C. difficile diarrhea 2015   COPD (chronic obstructive pulmonary disease) (HCC)    Depression    Eczema    H pylori ulcer    Headache(784.0)    Hypercholesterolemia    Hypertension    Kidney stones    20 years ago   Pneumonia    5 years ago   Seizures (HCC)    2 years ago, "cluster of seizures" none since   Shingles     Tobacco Use: Social History   Tobacco Use  Smoking Status Former   Packs/day: 0.50   Years: 30.00   Additional pack years: 0.00   Total pack years: 15.00   Types: Cigarettes   Quit date: 10/09/2021   Years since quitting: 1.4  Smokeless Tobacco Never  Tobacco Comments   5-10 cigarettes a day (plans to quit prior  to this surgery 05/06/15)    Labs: Review Flowsheet  More data exists      Latest Ref Rng & Units 01/11/2018 12/03/2019 01/06/2021 02/17/2021 10/14/2021  Labs for ITP Cardiac and Pulmonary Rehab  Cholestrol <200 mg/dL 130  865  784  696  295   LDL (calc) mg/dL (calc) 284  132  440  102  95   HDL-C > OR = 50 mg/dL 76  62  55  63  55   Trlycerides <150 mg/dL 725  366  440  347  425   Hemoglobin A1c <7.0 % 5.1  - - - -    Capillary Blood Glucose: Lab Results  Component Value Date   GLUCAP 96 10/12/2022   GLUCAP 100 (H) 12/13/2021     Pulmonary Assessment Scores:  Pulmonary Assessment Scores     Row Name 01/12/23 1436          ADL UCSD   SOB Score total 84       CAT Score   CAT Score 30       mMRC Score   mMRC Score 3             UCSD: Self-administered rating of dyspnea associated with activities of daily living (ADLs) 6-point scale (0 = "not at all" to 5 = "maximal or unable to do because of breathlessness")  Scoring Scores range from 0 to 120.  Minimally important difference is 5 units  CAT: CAT can identify the health impairment of COPD patients and is better correlated with disease progression.  CAT has a scoring range of zero to 40. The CAT score is classified into four groups of low (less than 10), medium (10 - 20), high (21-30) and very high (31-40) based on the impact level of disease on health status. A CAT score over 10 suggests significant symptoms.  A worsening CAT score could be explained by an exacerbation, poor medication adherence, poor inhaler technique, or progression of COPD or comorbid conditions.  CAT MCID is 2 points  mMRC: mMRC (Modified Medical Research Council) Dyspnea Scale is used to assess the degree of baseline functional disability in patients of respiratory disease due to dyspnea. No minimal important difference is established. A decrease in score of 1 point or greater is considered a positive change.   Pulmonary Function Assessment:  Pulmonary Function Assessment - 01/12/23 1327       Breath   Bilateral Breath Sounds Decreased;Rhonchi    Shortness of Breath Yes;Fear of Shortness of Breath;Limiting activity             Exercise Target Goals: Exercise Program Goal: Individual exercise prescription set using results from initial 6 min walk test and THRR while considering  patient's activity barriers and safety.   Exercise Prescription Goal: Initial exercise prescription builds to 30-45 minutes a day of aerobic activity, 2-3 days per week.  Home exercise guidelines will be given to patient during program as part of exercise prescription that the participant will  acknowledge.  Activity Barriers & Risk Stratification:  Activity Barriers & Cardiac Risk Stratification - 01/12/23 1433       Activity Barriers & Cardiac Risk Stratification   Activity Barriers Deconditioning;Muscular Weakness;Shortness of Breath;Arthritis             6 Minute Walk:  6 Minute Walk     Row Name 01/12/23 1451         6 Minute Walk   Phase Initial     Distance 758  feet     Walk Time 6 minutes     # of Rest Breaks 2     MPH 1.44     METS 2.56     RPE 11     Perceived Dyspnea  3     VO2 Peak 8.97     Symptoms Yes (comment)     Comments dyspnea, stopped patient for O2 sat <88% at 2:09, 2:41, and 5:25     Resting HR 93 bpm     Resting BP 104/64     Resting Oxygen Saturation  99 %     Exercise Oxygen Saturation  during 6 min walk 6 %     Max Ex. HR 119 bpm     Max Ex. BP 124/70     2 Minute Post BP 112/66       Interval HR   1 Minute HR 109     2 Minute HR 116     3 Minute HR 112     4 Minute HR 112     5 Minute HR 117     6 Minute HR 119     2 Minute Post HR 103     Interval Heart Rate? Yes       Interval Oxygen   Interval Oxygen? Yes     Baseline Oxygen Saturation % 99 %     1 Minute Oxygen Saturation % 92 %     1 Minute Liters of Oxygen 6 L     2 Minute Oxygen Saturation % 88 %  at 2:09 O2 82%, increased to 8L at 2:41 O2 84%, increased to 10L     2 Minute Liters of Oxygen 6 L  at 2:09 O2 82%, increased to 8L     3 Minute Oxygen Saturation % 90 %     3 Minute Liters of Oxygen 10 L     4 Minute Oxygen Saturation % 94 %     4 Minute Liters of Oxygen 10 L     5 Minute Oxygen Saturation % 92 %  5:45 O2 86%, PLB performed, O2 88%     5 Minute Liters of Oxygen 10 L  5:45 O2 86%, PLB performed, O2 88%     6 Minute Oxygen Saturation % 88 %     6 Minute Liters of Oxygen 10 L     2 Minute Post Oxygen Saturation % 99 %  4 min post O2 97%, HR 101, on 4L     2 Minute Post Liters of Oxygen 6 L  4 min post O2 97%, HR 101, on 4L               Oxygen Initial Assessment:  Oxygen Initial Assessment - 01/15/23 1349       Initial 6 min Walk   Oxygen Used Continuous    Liters per minute 10             Oxygen Re-Evaluation:  Oxygen Re-Evaluation     Row Name 01/12/23 1325 01/15/23 1349 01/15/23 1433 02/20/23 0945 03/27/23 0756     Program Oxygen Prescription   Program Oxygen Prescription -- -- -- Continuous Continuous   Liters per minute -- -- -- 8 8   Comments -- -- -- Pt initially needed 10L for exercise however recently she is only needing 8L. Pt initially needed 10L for exercise however recently she is only needing 8L. The last session she needed 15 L for the treadmill.  Home Oxygen   Home Oxygen Device -- Portable Concentrator;Home Concentrator;E-Tanks Portable Concentrator;Home Concentrator;E-Tanks Portable Concentrator;Home Concentrator;E-Tanks Portable Concentrator;Home Concentrator;E-Tanks   Sleep Oxygen Prescription -- Continuous;CPAP Continuous;CPAP Continuous;CPAP Continuous;CPAP   Liters per minute 2 2 2 2 2    Home Exercise Oxygen Prescription -- Continuous Continuous Continuous Continuous   Liters per minute -- 8 8 8 8    Home Resting Oxygen Prescription -- Continuous Continuous Continuous Continuous   Liters per minute -- 5 5 5 5    Compliance with Home Oxygen Use -- Yes Yes Yes Yes     Goals/Expected Outcomes   Short Term Goals To learn and exhibit compliance with exercise, home and travel O2 prescription;To learn and understand importance of maintaining oxygen saturations>88%;To learn and demonstrate proper use of respiratory medications;To learn and understand importance of monitoring SPO2 with pulse oximeter and demonstrate accurate use of the pulse oximeter.;To learn and demonstrate proper pursed lip breathing techniques or other breathing techniques.  To learn and exhibit compliance with exercise, home and travel O2 prescription;To learn and understand importance of maintaining oxygen  saturations>88%;To learn and demonstrate proper use of respiratory medications;To learn and understand importance of monitoring SPO2 with pulse oximeter and demonstrate accurate use of the pulse oximeter.;To learn and demonstrate proper pursed lip breathing techniques or other breathing techniques.  To learn and exhibit compliance with exercise, home and travel O2 prescription;To learn and understand importance of maintaining oxygen saturations>88%;To learn and demonstrate proper use of respiratory medications;To learn and understand importance of monitoring SPO2 with pulse oximeter and demonstrate accurate use of the pulse oximeter.;To learn and demonstrate proper pursed lip breathing techniques or other breathing techniques.  To learn and exhibit compliance with exercise, home and travel O2 prescription;To learn and understand importance of maintaining oxygen saturations>88%;To learn and demonstrate proper use of respiratory medications;To learn and understand importance of monitoring SPO2 with pulse oximeter and demonstrate accurate use of the pulse oximeter.;To learn and demonstrate proper pursed lip breathing techniques or other breathing techniques.  To learn and exhibit compliance with exercise, home and travel O2 prescription;To learn and understand importance of maintaining oxygen saturations>88%;To learn and demonstrate proper use of respiratory medications;To learn and understand importance of monitoring SPO2 with pulse oximeter and demonstrate accurate use of the pulse oximeter.;To learn and demonstrate proper pursed lip breathing techniques or other breathing techniques.    Long  Term Goals Exhibits compliance with exercise, home  and travel O2 prescription;Maintenance of O2 saturations>88%;Compliance with respiratory medication;Verbalizes importance of monitoring SPO2 with pulse oximeter and return demonstration;Exhibits proper breathing techniques, such as pursed lip breathing or other method taught  during program session;Demonstrates proper use of MDI's Exhibits compliance with exercise, home  and travel O2 prescription;Maintenance of O2 saturations>88%;Compliance with respiratory medication;Verbalizes importance of monitoring SPO2 with pulse oximeter and return demonstration;Exhibits proper breathing techniques, such as pursed lip breathing or other method taught during program session;Demonstrates proper use of MDI's Exhibits compliance with exercise, home  and travel O2 prescription;Maintenance of O2 saturations>88%;Compliance with respiratory medication;Verbalizes importance of monitoring SPO2 with pulse oximeter and return demonstration;Exhibits proper breathing techniques, such as pursed lip breathing or other method taught during program session;Demonstrates proper use of MDI's Exhibits compliance with exercise, home  and travel O2 prescription;Maintenance of O2 saturations>88%;Compliance with respiratory medication;Verbalizes importance of monitoring SPO2 with pulse oximeter and return demonstration;Exhibits proper breathing techniques, such as pursed lip breathing or other method taught during program session;Demonstrates proper use of MDI's Exhibits compliance with exercise, home  and travel O2 prescription;Maintenance of O2  saturations>88%;Compliance with respiratory medication;Verbalizes importance of monitoring SPO2 with pulse oximeter and return demonstration;Exhibits proper breathing techniques, such as pursed lip breathing or other method taught during program session;Demonstrates proper use of MDI's   Comments -- -- Pt to begin exercise this week. Pt is currently requiring 8L of oxygen during exercise. Pt is currently requiring 8L of oxygen during exercise most of the time but needed 15 L on TM last session.   Goals/Expected Outcomes For Nakeema to be compliant with her oxygen and respiratory medications, know how to use a pulse oximeter, and know what to do if her oxygen <88%. For Liese  to be compliant with her oxygen and respiratory medications, know how to use a pulse oximeter, and know what to do if her oxygen <88%. Compliance and understanding of oxygen saturation monitoring and breathing techniques to decrease shortness of breath. Compliance and understanding of oxygen saturation monitoring and breathing techniques to decrease shortness of breath. Compliance and understanding of oxygen saturation monitoring and breathing techniques to decrease shortness of breath.            Oxygen Discharge (Final Oxygen Re-Evaluation):  Oxygen Re-Evaluation - 03/27/23 0756       Program Oxygen Prescription   Program Oxygen Prescription Continuous    Liters per minute 8    Comments Pt initially needed 10L for exercise however recently she is only needing 8L. The last session she needed 15 L for the treadmill.      Home Oxygen   Home Oxygen Device Portable Concentrator;Home Concentrator;E-Tanks    Sleep Oxygen Prescription Continuous;CPAP    Liters per minute 2    Home Exercise Oxygen Prescription Continuous    Liters per minute 8    Home Resting Oxygen Prescription Continuous    Liters per minute 5    Compliance with Home Oxygen Use Yes      Goals/Expected Outcomes   Short Term Goals To learn and exhibit compliance with exercise, home and travel O2 prescription;To learn and understand importance of maintaining oxygen saturations>88%;To learn and demonstrate proper use of respiratory medications;To learn and understand importance of monitoring SPO2 with pulse oximeter and demonstrate accurate use of the pulse oximeter.;To learn and demonstrate proper pursed lip breathing techniques or other breathing techniques.     Long  Term Goals Exhibits compliance with exercise, home  and travel O2 prescription;Maintenance of O2 saturations>88%;Compliance with respiratory medication;Verbalizes importance of monitoring SPO2 with pulse oximeter and return demonstration;Exhibits proper breathing  techniques, such as pursed lip breathing or other method taught during program session;Demonstrates proper use of MDI's    Comments Pt is currently requiring 8L of oxygen during exercise most of the time but needed 15 L on TM last session.    Goals/Expected Outcomes Compliance and understanding of oxygen saturation monitoring and breathing techniques to decrease shortness of breath.             Initial Exercise Prescription:  Initial Exercise Prescription - 01/12/23 1500       Date of Initial Exercise RX and Referring Provider   Date 01/12/23    Referring Provider Ramaswamy    Expected Discharge Date 04/10/23      Oxygen   Oxygen Continuous    Liters 10    Maintain Oxygen Saturation 88% or higher      Recumbant Elliptical   Level 1    RPM 25    Minutes 15      Track   Minutes 15    METs 2.56  Prescription Details   Frequency (times per week) 2    Duration Progress to 30 minutes of continuous aerobic without signs/symptoms of physical distress      Intensity   THRR 40-80% of Max Heartrate 62-124    Ratings of Perceived Exertion 11-13    Perceived Dyspnea 0-4      Progression   Progression Continue to progress workloads to maintain intensity without signs/symptoms of physical distress.      Resistance Training   Training Prescription Yes    Weight red bands    Reps 10-15             Perform Capillary Blood Glucose checks as needed.  Exercise Prescription Changes:   Exercise Prescription Changes     Row Name 01/30/23 1500 02/13/23 1500 02/27/23 1500 03/01/23 1500 03/13/23 1500     Response to Exercise   Blood Pressure (Admit) 120/60 104/62 112/68 -- 104/60   Blood Pressure (Exercise) 124/66 130/58 140/68 -- 148/64   Blood Pressure (Exit) 118/52 104/56 96/60 -- 102/64   Heart Rate (Admit) 81 bpm 84 bpm 93 bpm -- 81 bpm   Heart Rate (Exercise) 107 bpm 114 bpm 114 bpm -- 122 bpm   Heart Rate (Exit) 96 bpm 88 bpm 97 bpm -- 104 bpm  recheck 87    Oxygen Saturation (Admit) 99 % 100 % 99 %  on 6L -- 100 %  6L   Oxygen Saturation (Exercise) 88 % 89 % 89 %  on 8L -- 85 %  after PLB came up to 92%   Oxygen Saturation (Exit) 91 % 96 % 99 %  on 4L -- 94 %  on 4L   Rating of Perceived Exertion (Exercise) 12 11 12  -- 13   Perceived Dyspnea (Exercise) 1 1 2  -- 2   Duration Continue with 30 min of aerobic exercise without signs/symptoms of physical distress. Continue with 30 min of aerobic exercise without signs/symptoms of physical distress. Continue with 30 min of aerobic exercise without signs/symptoms of physical distress. -- Continue with 30 min of aerobic exercise without signs/symptoms of physical distress.   Intensity THRR unchanged THRR unchanged THRR unchanged -- THRR unchanged     Resistance Training   Training Prescription Yes Yes Yes -- Yes   Weight red bands black bands black bands -- black bands   Reps 10-15 10-15 10-15 -- 10-15   Time 10 Minutes 10 Minutes 10 Minutes -- 10 Minutes     Oxygen   Oxygen Continuous Continuous Continuous -- Continuous   Liters 10 5-8L 6-8L -- 6-8L     Treadmill   MPH 2.4 2.5 2.5 -- 2.5   Grade 1 1 2.5 -- 3   Minutes 15 15 15  -- 15   METs 3.17 3.26 3.78 -- 3.95     Recumbant Elliptical   Level 2 3 3  -- 4   Minutes 15 15 15  -- 15   METs 4.6 3.9 3.9 -- 5.1     Home Exercise Plan   Plans to continue exercise at -- -- -- Home (comment)  walking --   Frequency -- -- -- Add 1 additional day to program exercise sessions. --   Initial Home Exercises Provided -- -- -- 03/01/23 --     Oxygen   Maintain Oxygen Saturation 88% or higher 88% or higher 88% or higher -- 88% or higher    Row Name 03/27/23 1500             Response to  Exercise   Blood Pressure (Admit) 94/52       Blood Pressure (Exercise) 108/60       Blood Pressure (Exit) 100/54       Heart Rate (Admit) 84 bpm       Heart Rate (Exercise) 108 bpm       Heart Rate (Exit) 93 bpm       Oxygen Saturation (Admit) 100 %  4L        Oxygen Saturation (Exercise) 91 %  8L       Oxygen Saturation (Exit) 91 %  4L       Rating of Perceived Exertion (Exercise) 12       Perceived Dyspnea (Exercise) 2       Duration Continue with 30 min of aerobic exercise without signs/symptoms of physical distress.       Intensity THRR unchanged         Progression   Progression Continue to progress workloads to maintain intensity without signs/symptoms of physical distress.         Resistance Training   Training Prescription Yes       Weight black bands       Reps 10-15       Time 10 Minutes         Oxygen   Oxygen Continuous       Liters 4-8L         Treadmill   MPH 3       Grade 3       Minutes 15       METs 4.54         Recumbant Elliptical   Level 3       Minutes 15       METs 4.1         Oxygen   Maintain Oxygen Saturation 88% or higher                Exercise Comments:   Exercise Comments     Row Name 01/18/23 1545 03/01/23 1521         Exercise Comments Pt completed her first day of group exercise. Pt ambulated on the track for 15 min, METs 2.23, 8 laps. She then exercised on the recumbent elliptical for 15 min, level 1, METs 3.1. Pt performed warm up and cool down with verbal cues. Completed home exercise plan. Pt used to walk her dog 15 min x3 a day but is disappointed as she struggles to walk 15 min x1 a day now. Encouraged her to focus on trying 15 min 1-2 nonrehab days a week on 8-10L.Pt agreed.               Exercise Goals and Review:   Exercise Goals     Row Name 01/12/23 1337 01/15/23 1344 02/20/23 0941 03/27/23 0751       Exercise Goals   Increase Physical Activity Yes Yes Yes Yes    Intervention Provide advice, education, support and counseling about physical activity/exercise needs.;Develop an individualized exercise prescription for aerobic and resistive training based on initial evaluation findings, risk stratification, comorbidities and participant's personal goals. Provide advice,  education, support and counseling about physical activity/exercise needs.;Develop an individualized exercise prescription for aerobic and resistive training based on initial evaluation findings, risk stratification, comorbidities and participant's personal goals. Provide advice, education, support and counseling about physical activity/exercise needs.;Develop an individualized exercise prescription for aerobic and resistive training based on initial evaluation findings, risk stratification, comorbidities and participant's personal  goals. Provide advice, education, support and counseling about physical activity/exercise needs.;Develop an individualized exercise prescription for aerobic and resistive training based on initial evaluation findings, risk stratification, comorbidities and participant's personal goals.    Expected Outcomes Short Term: Attend rehab on a regular basis to increase amount of physical activity.;Long Term: Exercising regularly at least 3-5 days a week.;Long Term: Add in home exercise to make exercise part of routine and to increase amount of physical activity. Short Term: Attend rehab on a regular basis to increase amount of physical activity.;Long Term: Exercising regularly at least 3-5 days a week.;Long Term: Add in home exercise to make exercise part of routine and to increase amount of physical activity. Short Term: Attend rehab on a regular basis to increase amount of physical activity.;Long Term: Exercising regularly at least 3-5 days a week.;Long Term: Add in home exercise to make exercise part of routine and to increase amount of physical activity. Short Term: Attend rehab on a regular basis to increase amount of physical activity.;Long Term: Exercising regularly at least 3-5 days a week.;Long Term: Add in home exercise to make exercise part of routine and to increase amount of physical activity.    Increase Strength and Stamina Yes Yes Yes Yes    Intervention Provide advice,  education, support and counseling about physical activity/exercise needs.;Develop an individualized exercise prescription for aerobic and resistive training based on initial evaluation findings, risk stratification, comorbidities and participant's personal goals. Provide advice, education, support and counseling about physical activity/exercise needs.;Develop an individualized exercise prescription for aerobic and resistive training based on initial evaluation findings, risk stratification, comorbidities and participant's personal goals. Provide advice, education, support and counseling about physical activity/exercise needs.;Develop an individualized exercise prescription for aerobic and resistive training based on initial evaluation findings, risk stratification, comorbidities and participant's personal goals. Provide advice, education, support and counseling about physical activity/exercise needs.;Develop an individualized exercise prescription for aerobic and resistive training based on initial evaluation findings, risk stratification, comorbidities and participant's personal goals.    Expected Outcomes Short Term: Increase workloads from initial exercise prescription for resistance, speed, and METs.;Short Term: Perform resistance training exercises routinely during rehab and add in resistance training at home;Long Term: Improve cardiorespiratory fitness, muscular endurance and strength as measured by increased METs and functional capacity ( ) Short Term: Increase workloads from initial exercise prescription for resistance, speed, and METs.;Short Term: Perform resistance training exercises routinely during rehab and add in resistance training at home;Long Term: Improve cardiorespiratory fitness, muscular endurance and strength as measured by increased METs and functional capacity ( ) Short Term: Increase workloads from initial exercise prescription for resistance, speed, and METs.;Short Term: Perform  resistance training exercises routinely during rehab and add in resistance training at home;Long Term: Improve cardiorespiratory fitness, muscular endurance and strength as measured by increased METs and functional capacity ( ) Short Term: Increase workloads from initial exercise prescription for resistance, speed, and METs.;Short Term: Perform resistance training exercises routinely during rehab and add in resistance training at home;Long Term: Improve cardiorespiratory fitness, muscular endurance and strength as measured by increased METs and functional capacity ( )    Able to understand and use rate of perceived exertion (RPE) scale Yes Yes Yes Yes    Intervention Provide education and explanation on how to use RPE scale Provide education and explanation on how to use RPE scale Provide education and explanation on how to use RPE scale Provide education and explanation on how to use RPE scale    Expected Outcomes Short Term: Able to use RPE  daily in rehab to express subjective intensity level;Long Term:  Able to use RPE to guide intensity level when exercising independently Short Term: Able to use RPE daily in rehab to express subjective intensity level;Long Term:  Able to use RPE to guide intensity level when exercising independently Short Term: Able to use RPE daily in rehab to express subjective intensity level;Long Term:  Able to use RPE to guide intensity level when exercising independently Short Term: Able to use RPE daily in rehab to express subjective intensity level;Long Term:  Able to use RPE to guide intensity level when exercising independently    Able to understand and use Dyspnea scale Yes Yes Yes Yes    Intervention Provide education and explanation on how to use Dyspnea scale Provide education and explanation on how to use Dyspnea scale Provide education and explanation on how to use Dyspnea scale Provide education and explanation on how to use Dyspnea scale    Expected Outcomes Short  Term: Able to use Dyspnea scale daily in rehab to express subjective sense of shortness of breath during exertion;Long Term: Able to use Dyspnea scale to guide intensity level when exercising independently Short Term: Able to use Dyspnea scale daily in rehab to express subjective sense of shortness of breath during exertion;Long Term: Able to use Dyspnea scale to guide intensity level when exercising independently Short Term: Able to use Dyspnea scale daily in rehab to express subjective sense of shortness of breath during exertion;Long Term: Able to use Dyspnea scale to guide intensity level when exercising independently Short Term: Able to use Dyspnea scale daily in rehab to express subjective sense of shortness of breath during exertion;Long Term: Able to use Dyspnea scale to guide intensity level when exercising independently    Knowledge and understanding of Target Heart Rate Range (THRR) Yes Yes Yes Yes    Intervention Provide education and explanation of THRR including how the numbers were predicted and where they are located for reference Provide education and explanation of THRR including how the numbers were predicted and where they are located for reference Provide education and explanation of THRR including how the numbers were predicted and where they are located for reference Provide education and explanation of THRR including how the numbers were predicted and where they are located for reference    Expected Outcomes Short Term: Able to state/look up THRR;Short Term: Able to use daily as guideline for intensity in rehab;Long Term: Able to use THRR to govern intensity when exercising independently Short Term: Able to state/look up THRR;Short Term: Able to use daily as guideline for intensity in rehab;Long Term: Able to use THRR to govern intensity when exercising independently Short Term: Able to state/look up THRR;Short Term: Able to use daily as guideline for intensity in rehab;Long Term: Able to  use THRR to govern intensity when exercising independently Short Term: Able to state/look up THRR;Short Term: Able to use daily as guideline for intensity in rehab;Long Term: Able to use THRR to govern intensity when exercising independently    Understanding of Exercise Prescription Yes Yes Yes Yes    Intervention Provide education, explanation, and written materials on patient's individual exercise prescription Provide education, explanation, and written materials on patient's individual exercise prescription Provide education, explanation, and written materials on patient's individual exercise prescription Provide education, explanation, and written materials on patient's individual exercise prescription    Expected Outcomes Short Term: Able to explain program exercise prescription;Long Term: Able to explain home exercise prescription to exercise independently Short Term:  Able to explain program exercise prescription;Long Term: Able to explain home exercise prescription to exercise independently Short Term: Able to explain program exercise prescription;Long Term: Able to explain home exercise prescription to exercise independently Short Term: Able to explain program exercise prescription;Long Term: Able to explain home exercise prescription to exercise independently             Exercise Goals Re-Evaluation :  Exercise Goals Re-Evaluation     Row Name 01/15/23 1344 02/20/23 0941 03/27/23 0752         Exercise Goal Re-Evaluation   Exercise Goals Review Increase Physical Activity;Able to understand and use Dyspnea scale;Understanding of Exercise Prescription;Increase Strength and Stamina;Able to understand and use rate of perceived exertion (RPE) scale;Knowledge and understanding of Target Heart Rate Range (THRR) Increase Physical Activity;Able to understand and use Dyspnea scale;Understanding of Exercise Prescription;Increase Strength and Stamina;Able to understand and use rate of perceived  exertion (RPE) scale;Knowledge and understanding of Target Heart Rate Range (THRR) Increase Physical Activity;Able to understand and use Dyspnea scale;Understanding of Exercise Prescription;Increase Strength and Stamina;Able to understand and use rate of perceived exertion (RPE) scale;Knowledge and understanding of Target Heart Rate Range (THRR)     Comments Pt to begin exercising this week. It is too early to discern progression. Pt has completed 8 exercise sessions. She missed one session for illness. She is exercising on the treadmill for 15 min, speed 2.5, incline 1 with METs 3.26. She then is exercising on the recumbent elliptical at level 3, METs 4.6, for 15 min. She is very motivated and pushes herself. Performs warm up and cool down unlimited. Will continue to progress as tolerated. Pt has completed 17 exercise sessions. She has missed a couple sessions for appointments and illness but is very motivated. She is exercising on the treadmill for 15 min, speed 2.5, incline 3 with METs 3.78. She then is exercising on the recumbent elliptical at level 3, METs 4.7, for 15 min. She attempted to increase her workloads last session but had higher O2 demands.  Will continue to progress as tolerated. She is scheduled to graduate 6/25.     Expected Outcomes Through exercise at rehab and home the patient will decrease shortness of breath with daily activities and feel confident in carrying out an exercise regimn at home Through exercise at rehab and home the patient will decrease shortness of breath with daily activities and feel confident in carrying out an exercise regimn at home Through exercise at rehab and home the patient will decrease shortness of breath with daily activities and feel confident in carrying out an exercise regimn at home.              Discharge Exercise Prescription (Final Exercise Prescription Changes):  Exercise Prescription Changes - 03/27/23 1500       Response to Exercise   Blood  Pressure (Admit) 94/52    Blood Pressure (Exercise) 108/60    Blood Pressure (Exit) 100/54    Heart Rate (Admit) 84 bpm    Heart Rate (Exercise) 108 bpm    Heart Rate (Exit) 93 bpm    Oxygen Saturation (Admit) 100 %   4L   Oxygen Saturation (Exercise) 91 %   8L   Oxygen Saturation (Exit) 91 %   4L   Rating of Perceived Exertion (Exercise) 12    Perceived Dyspnea (Exercise) 2    Duration Continue with 30 min of aerobic exercise without signs/symptoms of physical distress.    Intensity THRR unchanged  Progression   Progression Continue to progress workloads to maintain intensity without signs/symptoms of physical distress.      Resistance Training   Training Prescription Yes    Weight black bands    Reps 10-15    Time 10 Minutes      Oxygen   Oxygen Continuous    Liters 4-8L      Treadmill   MPH 3    Grade 3    Minutes 15    METs 4.54      Recumbant Elliptical   Level 3    Minutes 15    METs 4.1      Oxygen   Maintain Oxygen Saturation 88% or higher             Nutrition:  Target Goals: Understanding of nutrition guidelines, daily intake of sodium 1500mg , cholesterol 200mg , calories 30% from fat and 7% or less from saturated fats, daily to have 5 or more servings of fruits and vegetables.  Biometrics:   Post Biometrics - 01/12/23 1306        Post  Biometrics   Grip Strength 24 kg             Nutrition Therapy Plan and Nutrition Goals:  Nutrition Therapy & Goals - 03/20/23 1340       Nutrition Therapy   Diet General Healthy Diet    Drug/Food Interactions Statins/Certain Fruits      Personal Nutrition Goals   Nutrition Goal Patient to improve diet quality by using the plate method as a guide for meal planning to include lean protein/plant protein, fruits, vegetables, whole grains, nonfat dairy as part of a well-balanced diet.    Personal Goal #2 Patient to identify strategies for weight gain of 0.5-2.0# per week.    Comments Goals not met.  Johann recently decreased dose of Esbriet due to GI side effects and decreased appetite. However, she continues to lose weight; she is down 10.8# since starting with our program. WE have discussed multiple strategies for weight gain including Nutrition supplements, increasing calories from protein/fat/carbohydrates, and increasing eating frequency. She reports that she continues to eat three meals daily and has been slow to adopt weight gain strategies. Will continue to montor weekly weights. She has medical history of Alpha Gal, squamous cell carcinoma of right lung. Areon will continue to benefit from pulmonary rehab for nutrition and exercise support.      Intervention Plan   Intervention Prescribe, educate and counsel regarding individualized specific dietary modifications aiming towards targeted core components such as weight, hypertension, lipid management, diabetes, heart failure and other comorbidities.;Nutrition handout(s) given to patient.    Expected Outcomes Short Term Goal: Understand basic principles of dietary content, such as calories, fat, sodium, cholesterol and nutrients.;Long Term Goal: Adherence to prescribed nutrition plan.             Nutrition Assessments:  MEDIFICTS Score Key: ?70 Need to make dietary changes  40-70 Heart Healthy Diet ? 40 Therapeutic Level Cholesterol Diet   Picture Your Plate Scores: <40 Unhealthy dietary pattern with much room for improvement. 41-50 Dietary pattern unlikely to meet recommendations for good health and room for improvement. 51-60 More healthful dietary pattern, with some room for improvement.  >60 Healthy dietary pattern, although there may be some specific behaviors that could be improved.    Nutrition Goals Re-Evaluation:  Nutrition Goals Re-Evaluation     Row Name 01/18/23 1504 02/15/23 1439 03/20/23 1340         Goals  Current Weight 144 lb 13.5 oz (65.7 kg) 140 lb 3.4 oz (63.6 kg) 137 lb 9.1 oz (62.4 kg)      Comment Lipids WNL (2022) hemoglobin 11.3, ALT/AST WNL no new labs; most recent labs hemoglobin 11.3, ALT/AST WNL     Expected Outcome Faria recently increased dose of Esbriet and continues to struggle with nausea, vomiting. Discussed small, frequent meals and easy to digest foods to aid with weight maintenance when not feeling well. She does enjoy a wide variety of foods. She has medical history of Alpha Gal, squamous cell carcinoma of right lung. Kenlee will continue to benefit from pulmonary rehab for nutrition and exercise support. Jada recently decreased dose of Esbriet due to GI side effects and decreased appetite. However, she continues to lose weight; she is down 5# since starting with our program. She declined multiple strategies for weight gain including introducing nutrition supplements and/or increasing eating frequency to aid with increasing calories. She reports that she continues to eat three meals daily. Will continue to montor weekly weights. She has medical history of Alpha Gal, squamous cell carcinoma of right lung. Nevena will continue to benefit from pulmonary rehab for nutrition and exercise support Goals not met. Raechelle recently decreased dose of Esbriet due to GI side effects and decreased appetite. However, she continues to lose weight; she is down 10.8# since starting with our program. WE have discussed multiple strategies for weight gain including Nutrition supplements, increasing calories from protein/fat/carbohydrates, and increasing eating frequency. She reports that she continues to eat three meals daily and has been slow to adopt weight gain strategies. Will continue to montor weekly weights. She has medical history of Alpha Gal, squamous cell carcinoma of right lung. Zoraya will continue to benefit from pulmonary rehab for nutrition and exercise support.              Nutrition Goals Discharge (Final Nutrition Goals Re-Evaluation):  Nutrition Goals Re-Evaluation -  03/20/23 1340       Goals   Current Weight 137 lb 9.1 oz (62.4 kg)    Comment no new labs; most recent labs hemoglobin 11.3, ALT/AST WNL    Expected Outcome Goals not met. Brendan recently decreased dose of Esbriet due to GI side effects and decreased appetite. However, she continues to lose weight; she is down 10.8# since starting with our program. WE have discussed multiple strategies for weight gain including Nutrition supplements, increasing calories from protein/fat/carbohydrates, and increasing eating frequency. She reports that she continues to eat three meals daily and has been slow to adopt weight gain strategies. Will continue to montor weekly weights. She has medical history of Alpha Gal, squamous cell carcinoma of right lung. Hylee will continue to benefit from pulmonary rehab for nutrition and exercise support.             Psychosocial: Target Goals: Acknowledge presence or absence of significant depression and/or stress, maximize coping skills, provide positive support system. Participant is able to verbalize types and ability to use techniques and skills needed for reducing stress and depression.  Initial Review & Psychosocial Screening:  Initial Psych Review & Screening - 01/12/23 1327       Initial Review   Current issues with Current Depression;Current Anxiety/Panic;Current Psychotropic Meds      Family Dynamics   Good Support System? Yes    Comments Frankye stated she feels like her diagnosis was a "blow to the gut". She pictured her retirement as vacations with her family and living her life to  the fullest. Being diagnosed, having chemo and radiation, and now requiring high oxygen requirements has been hard for her to handle. She declines a referral currently for mental health assistance. She states she has great support from her husband and family.      Barriers   Psychosocial barriers to participate in program The patient should benefit from training in stress  management and relaxation.;Psychosocial barriers identified (see note)      Screening Interventions   Interventions Encouraged to exercise;To provide support and resources with identified psychosocial needs    Expected Outcomes Long Term Goal: Stressors or current issues are controlled or eliminated.;Short Term goal: Identification and review with participant of any Quality of Life or Depression concerns found by scoring the questionnaire.;Long Term goal: The participant improves quality of Life and PHQ9 Scores as seen by post scores and/or verbalization of changes             Quality of Life Scores:  Scores of 19 and below usually indicate a poorer quality of life in these areas.  A difference of  2-3 points is a clinically meaningful difference.  A difference of 2-3 points in the total score of the Quality of Life Index has been associated with significant improvement in overall quality of life, self-image, physical symptoms, and general health in studies assessing change in quality of life.  PHQ-9: Review Flowsheet  More data exists      01/12/2023 07/25/2022 06/15/2020 06/11/2020 03/31/2020  Depression screen PHQ 2/9  Decreased Interest 2 0 0 0 1  Down, Depressed, Hopeless 2 0 0 0 1  PHQ - 2 Score 4 0 0 0 2  Altered sleeping 1 1 - - 0  Tired, decreased energy 2 0 - - 1  Change in appetite 1 0 - - 1  Feeling bad or failure about yourself  1 0 - - 1  Trouble concentrating 1 1 - - 1  Moving slowly or fidgety/restless 0 0 - - 1  Suicidal thoughts 1 0 - - 0  PHQ-9 Score 11 2 - - 7  Difficult doing work/chores Somewhat difficult Not difficult at all - - Somewhat difficult   Interpretation of Total Score  Total Score Depression Severity:  1-4 = Minimal depression, 5-9 = Mild depression, 10-14 = Moderate depression, 15-19 = Moderately severe depression, 20-27 = Severe depression   Psychosocial Evaluation and Intervention:  Psychosocial Evaluation - 01/12/23 1449       Psychosocial  Evaluation & Interventions   Interventions Encouraged to exercise with the program and follow exercise prescription    Comments Kamberlyn stated she feels like her diagnosis was a "blow to the gut". She pictured her retirement as vacations with her family and living her life to the fullest. Being diagnosed, having chemo and radiation, and now requiring high oxygen requirements has been hard for her to handle. She declines a referral currently for mental health assistance. She states she has great support from her husband and family.    Expected Outcomes For Teshia to participated in pulmonary rehab without any psychosocial barriers or concerns    Continue Psychosocial Services  Follow up required by staff   We will continue to monitor and assess for any needs            Psychosocial Re-Evaluation:  Psychosocial Re-Evaluation     Row Name 01/12/23 1448 02/23/23 0833 03/27/23 1615         Psychosocial Re-Evaluation   Current issues with Current Depression;Current Anxiety/Panic;Current Psychotropic  Meds Current Depression;Current Anxiety/Panic;Current Psychotropic Meds Current Depression;Current Anxiety/Panic;Current Psychotropic Meds     Comments No new changes since orientation on 01/12/23. Tani is scheduled to start the program on 01/18/2023 Syna still states that the diagnosis was a "blow". She didn't picture her retirement the way it is now. She is upset about her travel restrictions due to her high oxygen demands. Krissy has also started Pirfenidone and has debilitating GI side effects. She is distraught she can not tolerate the full dose prescribed by her MD. Our dietician is helping Jasonna gain weight as she has been consistently losing weight due to the medication. At first, she wasn't receptive, but is opening up to the idea of increasing her PO intake. Ahnesty seems more depressed after her recent news of her disease progression despite aggressive treatment. Her oxygen demand has  also increased, and she has brought in palliative care to help with goals of care. She was looking forward to international travel during retirement but due to her high oxygen demands she can no longer fly. She seems sad about seeing pictures of [place] from her friends on a trip she was supposed to be on. She states she hasn't been "crying" about it she's just going to live out her life. She declines a referral to a mental health expert currently.     Expected Outcomes For Janyria to attend PR without any psychosocial barriers or concerns For Lucetta to continue to attend PR without any psychosocial barriers or concerns. For Maxi to get professional mental health assistance. For Horace to continue to attend PR without any psychosocial barriers or concerns. For Delaphine to get professional mental health assistance.     Interventions Encouraged to attend Pulmonary Rehabilitation for the exercise Encouraged to attend Pulmonary Rehabilitation for the exercise Encouraged to attend Pulmonary Rehabilitation for the exercise     Continue Psychosocial Services  Follow up required by staff  We will continue to monitor and assess Richardean Follow up required by staff  We will continue to monitor and assess Emmajane Follow up required by staff  We will continue to monitor and assess [facility]              Psychosocial Discharge (Final Psychosocial Re-Evaluation):  Psychosocial Re-Evaluation - 03/27/23 1615       Psychosocial Re-Evaluation   Current issues with Current Depression;Current Anxiety/Panic;Current Psychotropic Meds    Comments Amayah seems more depressed after her recent news of her disease progression despite aggressive treatment. Her oxygen demand has also increased, and she has brought in palliative care to help with goals of care. She was looking forward to international travel during retirement but due to her high oxygen demands she can no longer fly. She seems sad about seeing pictures of [place]  from her friends on a trip she was supposed to be on. She states she hasn't been "crying" about it she's just going to live out her life. She declines a referral to a mental health expert currently.    Expected Outcomes For Avantika to continue to attend PR without any psychosocial barriers or concerns. For Dayanara to get professional mental health assistance.    Interventions Encouraged to attend Pulmonary Rehabilitation for the exercise    Continue Psychosocial Services  Follow up required by staff   We will continue to monitor and assess Yannet            Education: Education Goals: Education classes will be provided on a weekly basis, covering required topics. Participant will  state understanding/return demonstration of topics presented.  Learning Barriers/Preferences:   Education Topics: Introduction to Pulmonary Rehab Group instruction provided by PowerPoint, verbal discussion, and written material to support subject matter. Instructor reviews what Pulmonary Rehab is, the purpose of the program, and how patients are referred.     Know Your Numbers Group instruction that is supported by a PowerPoint presentation. Instructor discusses importance of knowing and understanding resting, exercise, and post-exercise oxygen saturation, heart rate, and blood pressure. Oxygen saturation, heart rate, blood pressure, rating of perceived exertion, and dyspnea are reviewed along with a normal range for these values.  Flowsheet Row PULMONARY REHAB OTHER RESPIRATORY from 01/25/2023 in [facility]  Date 01/25/23  Educator EP  Instruction Review Code 1- Verbalizes Understanding       Exercise for the Pulmonary Patient Group instruction that is supported by a PowerPoint presentation. Instructor discusses benefits of exercise, core components of exercise, frequency, duration, and intensity of an exercise routine, importance of utilizing  pulse oximetry during exercise, safety while exercising, and options of places to exercise outside of rehab.  Flowsheet Row PULMONARY REHAB OTHER RESPIRATORY from 01/18/2023 in [facility] for Heart, Vascular, & Lung Health  Date 01/18/23  Educator EP  Instruction Review Code 1- Verbalizes Understanding          MET Level  Group instruction provided by PowerPoint, verbal discussion, and written material to support subject matter. Instructor reviews what METs are and how to increase METs.  Flowsheet Row PULMONARY REHAB OTHER RESPIRATORY from 02/15/2023 in [facility] for Heart, Vascular, & Lung Health  Date 02/15/23  Educator EP  Instruction Review Code 1- Verbalizes Understanding       Pulmonary Medications Verbally interactive group education provided by instructor with focus on inhaled medications and proper administration.   Anatomy and Physiology of the Respiratory System Group instruction provided by PowerPoint, verbal discussion, and written material to support subject matter. Instructor reviews respiratory cycle and anatomical components of the respiratory system and their functions. Instructor also reviews differences in obstructive and restrictive respiratory diseases with examples of each.    Oxygen Safety Group instruction provided by PowerPoint, verbal discussion, and written material to support subject matter. There is an overview of "What is Oxygen" and "Why do we need it".  Instructor also reviews how to create a safe environment for oxygen use, the importance of using oxygen as prescribed, and the risks of noncompliance. There is a brief discussion on traveling with oxygen and resources the patient may utilize. Flowsheet Row PULMONARY REHAB OTHER RESPIRATORY from 02/01/2023 in [facility] for Heart, Vascular, & Lung Health  Date 02/01/23  Educator RN  Instruction Review Code 1- Verbalizes  Understanding       Oxygen Use Group instruction provided by PowerPoint, verbal discussion, and written material to discuss how supplemental oxygen is prescribed and different types of oxygen supply systems. Resources for more information are provided.    Breathing Techniques Group instruction that is supported by demonstration and informational handouts. Instructor discusses the benefits of pursed lip and diaphragmatic breathing and detailed demonstration on how to perform both.  Flowsheet Row PULMONARY REHAB OTHER RESPIRATORY from 02/22/2023 in [facility] for Heart, Vascular, & Lung Health  Date 02/22/23  Educator RT  Instruction Review Code 1- Verbalizes Understanding        Risk Factor Reduction Group instruction that is supported by  a PowerPoint presentation. Instructor discusses the definition of a risk factor, different risk factors for pulmonary disease, and how the heart and lungs work together.   MD Day A group question and answer session with a medical doctor that allows participants to ask questions that relate to their pulmonary disease state.   Nutrition for the Pulmonary Patient Group instruction provided by PowerPoint slides, verbal discussion, and written materials to support subject matter. The instructor gives an explanation and review of healthy diet recommendations, which includes a discussion on weight management, recommendations for fruit and vegetable consumption, as well as protein, fluid, caffeine, fiber, sodium, sugar, and alcohol. Tips for eating when patients are short of breath are discussed.    Other Education Group or individual verbal, written, or video instructions that support the educational goals of the pulmonary rehab program. Flowsheet Row PULMONARY REHAB OTHER RESPIRATORY from 03/15/2023 in [facility] for Heart, Vascular, & Lung Health  Date 03/15/23  Educator RN  Instruction Review Code 1-  Verbalizes Understanding        Knowledge Questionnaire Score:  Knowledge Questionnaire Score - 01/12/23 1450       Knowledge Questionnaire Score   Pre Score 17/18             Core Components/Risk Factors/Patient Goals at Admission:  Personal Goals and Risk Factors at Admission - 01/12/23 1335       Core Components/Risk Factors/Patient Goals on Admission    Weight Management Weight Maintenance    Improve shortness of breath with ADL's Yes    Intervention Provide education, individualized exercise plan and daily activity instruction to help decrease symptoms of SOB with activities of daily living.    Expected Outcomes Short Term: Improve cardiorespiratory fitness to achieve a reduction of symptoms when performing ADLs;Long Term: Be able to perform more ADLs without symptoms or delay the onset of symptoms    Increase knowledge of respiratory medications and ability to use respiratory devices properly  Yes    Intervention Provide education and demonstration as needed of appropriate use of medications, inhalers, and oxygen therapy.    Expected Outcomes Short Term: Achieves understanding of medications use. Understands that oxygen is a medication prescribed by physician. Demonstrates appropriate use of inhaler and oxygen therapy.;Long Term: Maintain appropriate use of medications, inhalers, and oxygen therapy.             Core Components/Risk Factors/Patient Goals Review:   Goals and Risk Factor Review     Row Name 01/17/23 1537 02/23/23 0838 03/27/23 1643         Core Components/Risk Factors/Patient Goals Review   Personal Goals Review Weight Management/Obesity;Improve shortness of breath with ADL's;Develop more efficient breathing techniques such as purse lipped breathing and diaphragmatic breathing and practicing self-pacing with activity.;Increase knowledge of respiratory medications and ability to use respiratory devices properly. Weight Management/Obesity;Improve  shortness of breath with ADL's;Develop more efficient breathing techniques such as purse lipped breathing and diaphragmatic breathing and practicing self-pacing with activity.;Increase knowledge of respiratory medications and ability to use respiratory devices properly. Weight Management/Obesity;Improve shortness of breath with ADL's     Review Unable to assess goals since Bronson Curb has not started the program yet. She is scheduled to start on 01/18/2023 Jyll has met her goal of increasing her knowledge of respiratory meds and using them properly. She has correctly stated when to use her inhaler and has properly demonstrated it with our respiratory therapist. She has also met her goal of developing more efficient breathing  techniques and self-pacing. Karena can initiate pursed lip breathing when she is short of breath without staff involvement. Aleeya is still working on improving her shortness of breath but feels like PR is helping. Her oxygen saturation has maintained on 8L while exercising. Raegene is working with our dietician on maintaining her weight. Since the start of Pirfenidone she has been consistently losing weight. She is striving to achieve her goal of weight gain and weight maintenance. Goals have not been met to improve shortness of breath with ADLs. Sekina is requiring more oxygen due to her disease progression. After starting Pirfenidone, Sadiya has lost weight due to the GI side effects.  She has been working with our dietitian to gain weight (see dietician notes). Lareka has met her other 2 goals of increasing her knowledge for respiratory medications and developing more efficient breathing techniques.     Expected Outcomes See Admission Goals See Admission Goals See Admission Goals              Core Components/Risk Factors/Patient Goals at Discharge (Final Review):   Goals and Risk Factor Review - 03/27/23 1643       Core Components/Risk Factors/Patient Goals Review   Personal  Goals Review Weight Management/Obesity;Improve shortness of breath with ADL's    Review Goals have not been met to improve shortness of breath with ADLs. Claris is requiring more oxygen due to her disease progression. After starting Pirfenidone, Keala has lost weight due to the GI side effects.  She has been working with our dietitian to gain weight (see dietician notes). Sundas has met her other 2 goals of increasing her knowledge for respiratory medications and developing more efficient breathing techniques.    Expected Outcomes See Admission Goals             ITP Comments:Pt is making expected progress toward Pulmonary Rehab goals after completing 18 sessions. Recommend continued exercise, life style modification, education, and utilization of breathing techniques to increase stamina and strength, while also decreasing shortness of breath with exertion.  Dr. Mechele Collin is Medical Director for Pulmonary Rehab at [facility].

## 2023-03-28 NOTE — Telephone Encounter (Signed)
I spoke with the pt and notified of results/recs per Dr. Marchelle Gearing She verbalized understanding  States that she is not supposed to see Dr. Arbutus Ped until late July  Forwarding to the cancer center to see about getting her in sooner, thanks!

## 2023-03-28 NOTE — Telephone Encounter (Signed)
Si Gaul, MD  to Me  [facility] 3     03/28/23  4:03 PM I will be happy to see her sooner.  She may need a PET scan.  It will be helpful if Dr. Marchelle Gearing can order it for [place] before we see the patient.  Thank you   MR- please advise if okay to order PET per Dr. Asa Lente request? Thanks!

## 2023-03-29 ENCOUNTER — Encounter (HOSPITAL_COMMUNITY)
Admission: RE | Admit: 2023-03-29 | Discharge: 2023-03-29 | Disposition: A | Discharge status: Home / Self Care | Payer: Medicare Other | Source: Ambulatory Visit | Attending: Internal Medicine | Admitting: Internal Medicine

## 2023-03-29 ENCOUNTER — Telehealth: Payer: Self-pay | Admitting: Internal Medicine

## 2023-03-29 ENCOUNTER — Ambulatory Visit (HOSPITAL_COMMUNITY)
Admission: RE | Admit: 2023-03-29 | Discharge: 2023-03-29 | Disposition: A | Discharge status: Home / Self Care | Payer: Medicare Other | Source: Ambulatory Visit | Attending: Internal Medicine | Admitting: Internal Medicine

## 2023-03-29 DIAGNOSIS — R0600 Dyspnea, unspecified: Secondary | ICD-10-CM | POA: Diagnosis present

## 2023-03-29 DIAGNOSIS — J9611 Chronic respiratory failure with hypoxia: Secondary | ICD-10-CM | POA: Diagnosis present

## 2023-03-29 DIAGNOSIS — J849 Interstitial pulmonary disease, unspecified: Secondary | ICD-10-CM

## 2023-03-29 DIAGNOSIS — Z7189 Other specified counseling: Secondary | ICD-10-CM | POA: Insufficient documentation

## 2023-03-29 DIAGNOSIS — J84112 Idiopathic pulmonary fibrosis: Secondary | ICD-10-CM | POA: Insufficient documentation

## 2023-03-29 DIAGNOSIS — Z66 Do not resuscitate: Secondary | ICD-10-CM | POA: Insufficient documentation

## 2023-03-29 DIAGNOSIS — Z85118 Personal history of other malignant neoplasm of bronchus and lung: Secondary | ICD-10-CM | POA: Insufficient documentation

## 2023-03-29 DIAGNOSIS — R053 Chronic cough: Secondary | ICD-10-CM | POA: Diagnosis present

## 2023-03-29 DIAGNOSIS — J439 Emphysema, unspecified: Secondary | ICD-10-CM | POA: Diagnosis present

## 2023-03-29 LAB — BLOOD GAS, ARTERIAL
Acid-Base Excess: 4 mmol/L — ABNORMAL HIGH (ref 0.0–2.0)
Bicarbonate: 27.6 mmol/L (ref 20.0–28.0)
Drawn by: 24910
O2 Saturation: 99.9 %
Patient temperature: 37
pCO2 arterial: 37 mmHg (ref 32–48)
pH, Arterial: 7.48 — ABNORMAL HIGH (ref 7.35–7.45)
pO2, Arterial: 103 mmHg (ref 83–108)

## 2023-03-29 NOTE — Progress Notes (Signed)
Daily Session Note  Patient Details  Name: Diane [name] MRN: [id number] Date of Birth: Apr 11, 1956 Referring Provider:   Doristine Devoid Pulmonary Rehab Walk Test from 01/12/2023 in [facility] for Heart, Vascular, & Lung Health  Referring Provider Ramaswamy       Encounter Date: 03/29/2023  Check In:  Session Check In - 03/29/23 1507       Check-In   Supervising physician immediately available to respond to emergencies [facility] MD immediately available   Simultaneous filing. User may not have seen previous data.   Physician(s) Eligha Bridegroom, NP   Simultaneous filing. User may not have seen previous data.   Location [facility]   Simultaneous filing. User may not have seen previous data.   Staff Present Samantha Belarus, RD, LDN;Johnny Hale Bogus, MS, Exercise Physiologist;Askari Kinley, RN, BSN;Randi Reeve BS, ACSM-CEP, Exercise Physiologist;David Donnybrook, MS, ACSM-CEP, CCRP, Exercise Physiologist;Casey Smith, RT   Simultaneous filing. User may not have seen previous data.   Virtual Visit No   Simultaneous filing. User may not have seen previous data.   Medication changes reported     No   Simultaneous filing. User may not have seen previous data.   Comments stopped iron supplement    Fall or balance concerns reported    No   Simultaneous filing. User may not have seen previous data.   Tobacco Cessation No Change   Simultaneous filing. User may not have seen previous data.   Warm-up and Cool-down Performed as group-led instruction   Simultaneous filing. User may not have seen previous data.   Resistance Training Performed Yes   Simultaneous filing. User may not have seen previous data.   VAD Patient? No   Simultaneous filing. User may not have seen previous data.   PAD/SET Patient? No   Simultaneous filing. User may not have seen previous data.     Pain Assessment   Currently in Pain? No/denies   Simultaneous filing. User may not have seen  previous data.   Pain Score 0-No pain    Multiple Pain Sites No   Simultaneous filing. User may not have seen previous data.            Capillary Blood Glucose: Results for orders placed or performed during the hospital encounter of 03/29/23 (from the past 24 hour(s))  Blood gas, arterial     Status: Abnormal   Collection Time: 03/29/23 12:55 PM  Result Value Ref Range   pH, Arterial 7.48 (H) 7.35 - 7.45   pCO2 arterial 37 32 - 48 mmHg   pO2, Arterial 103 83 - 108 mmHg   Bicarbonate 27.6 20.0 - 28.0 mmol/L   Acid-Base Excess 4.0 (H) 0.0 - 2.0 mmol/L   O2 Saturation 99.9 %   Patient temperature 37.0    Collection site RIGHT RADIAL    Drawn by [id number]    Allens test (pass/fail) PASS PASS      Social History   Tobacco Use  Smoking Status Former   Packs/day: 0.50   Years: 30.00   Additional pack years: 0.00   Total pack years: 15.00   Types: Cigarettes   Quit date: 10/09/2021   Years since quitting: 1.4  Smokeless Tobacco Never  Tobacco Comments   5-10 cigarettes a day (plans to quit prior to this surgery 05/06/15)    Goals Met:  Independence with exercise equipment No report of concerns or symptoms today Strength training completed today  Goals Unmet:  Not Applicable  Comments: Service  time is from 1324 to 1446    Dr. Mechele Collin is Medical Director for Pulmonary Rehab at [facility].

## 2023-03-29 NOTE — Telephone Encounter (Signed)
Yes pls order PET scan - hx of lung cancr an d new nodule

## 2023-03-29 NOTE — Progress Notes (Signed)
Lab             03/29/23                      1255         PHART        7.48*        PCO2ART      37           PO2ART       103          HCO3         27.6         O2SAT        99.9          ABG does not show any evidence of carbon dioxide retention.  Therefore no indication for nocturnal BiPAP

## 2023-03-29 NOTE — Telephone Encounter (Signed)
PET was ordered 

## 2023-03-30 ENCOUNTER — Telehealth: Payer: Self-pay | Admitting: Internal Medicine

## 2023-03-30 NOTE — Telephone Encounter (Signed)
Scheduled per 06/12 scheduling message, patient has been called and notified.

## 2023-04-03 ENCOUNTER — Encounter (HOSPITAL_COMMUNITY)
Admission: RE | Admit: 2023-04-03 | Discharge: 2023-04-03 | Disposition: A | Discharge status: Home / Self Care | Payer: Medicare Other | Source: Ambulatory Visit | Attending: Internal Medicine | Admitting: Internal Medicine

## 2023-04-03 DIAGNOSIS — J849 Interstitial pulmonary disease, unspecified: Secondary | ICD-10-CM

## 2023-04-03 NOTE — Telephone Encounter (Signed)
Called and spoke with pt, pt did view results via mychart but they have not been read by a doctor. Pt is requesting for these to be read. Ms. Diane [name] also wanted to know if you were moving forward with the steroids or not. Pt has been scheduled for her PET, CTA was completed 03/26/23 and cxr 03/22/23. Dr.Ramaswamy please advise.

## 2023-04-03 NOTE — Telephone Encounter (Signed)
Thanks for the follow-up.  My sincere apologies.  The chest x-ray report is less relevant because we have the CT scan.  On the CT scan what I see is fibrosis and then evidence of fibrosis and emphysema.  Does not much of groundglass opacities for which steroids would be beneficial.  However on the blood work result the ESR is high (80) and based on this we could consider a high-dose steroid for 3 days through IV.  Allowed to set it up with the infusion center.  I can certainly entertain this.  In order to discuss the risks benefits and limitations please set up a video visit this week 15 minutes   CT Chest  IMPRESSION: No evidence of pulmonary embolism.   Increased size of 1.9 cm nodular opacity in posterior right lower lobe, suspicious for carcinoma.   No significant change in areas of right lung airspace disease and bronchiectasis, likely due to post radiation changes.   Chronic pulmonary emphysema and fibrosis.   Aortic Atherosclerosis (ICD10-I70.0) and Emphysema (ICD10-J43.9).     Electronically Signed   By: Danae Orleans M.D.     Latest Reference Range & Units 03/22/23 15:59  Alkaline Phosphatase 39 - 117 U/L 68  Albumin 3.5 - 5.2 g/dL 4.2  AST 0 - 37 U/L 21  ALT 0 - 35 U/L 15  Total Protein 6.0 - 8.3 g/dL 8.7 (H)  Bilirubin, Direct 0.0 - 0.3 mg/dL 0.0  Total Bilirubin 0.2 - 1.2 mg/dL 0.3  Pro B Natriuretic peptide (BNP) 0.0 - 100.0 pg/mL 58.0  Troponin I < OR = 47 ng/L <3  WBC 4.0 - 10.5 K/uL 6.6  RBC 3.87 - 5.11 Mil/uL 3.55 (L)  Hemoglobin 12.0 - 15.0 g/dL 16.1 (L)  HCT 09.6 - 04.5 % 32.8 (L)  MCV 78.0 - 100.0 fl 92.3  MCHC 30.0 - 36.0 g/dL 40.9  RDW 81.1 - 91.4 % 12.9  Platelets 150.0 - 400.0 K/uL 365.0  Neutrophils 43.0 - 77.0 % 67.9  Lymphocytes 12.0 - 46.0 % 20.7  Monocytes Relative 3.0 - 12.0 % 7.3  Eosinophil 0.0 - 5.0 % 3.2  Basophil 0.0 - 3.0 % 0.9  NEUT# 1.4 - 7.7 K/uL 4.5  Lymphocyte # 0.7 - 4.0 K/uL 1.4  Monocyte # 0.1 - 1.0 K/uL 0.5  Eosinophils  Absolute 0.0 - 0.7 K/uL 0.2  Basophils Absolute 0.0 - 0.1 K/uL 0.1  Sed Rate 0 - 30 mm/hr 80 (H)  D-Dimer, Quant <0.50 mcg/mL FEU 0.67 (H)  (H): Data is abnormally high (L): Data is abnormally low

## 2023-04-03 NOTE — Progress Notes (Signed)
Daily Session Note  Patient Details  Name: Diane [name] MRN: [id number] Date of Birth: [birth year] Referring Provider:   Doristine Devoid Pulmonary Rehab Walk Test from 01/12/2023 in [facility] for Heart, Vascular, & Lung Health  Referring Provider Ramaswamy       Encounter Date: 04/03/2023  Check In:  Session Check In - 04/03/23 1435       Check-In   Supervising physician immediately available to respond to emergencies [facility] MD immediately available    Physician(s) Bernadene Person, NP    Location [facility]    Staff Present Elissa Lovett BS, ACSM-CEP, Exercise Physiologist;Athalie Newhard Earlene Plater, MS, ACSM-CEP, Exercise Physiologist;Casey Thedore Mins, RN, Lavonia Dana, RN, BSN    Virtual Visit No    Medication changes reported     No    Comments stopped iron supplement    Fall or balance concerns reported    No    Tobacco Cessation No Change    Warm-up and Cool-down Performed as group-led instruction    Resistance Training Performed Yes    VAD Patient? No    PAD/SET Patient? No      Pain Assessment   Currently in Pain? No/denies    Pain Score 0-No pain    Multiple Pain Sites No             Capillary Blood Glucose: No results found for this or any previous visit (from the past 24 hour(s)).    Social History   Tobacco Use  Smoking Status Former   Packs/day: 0.50   Years: 30.00   Additional pack years: 0.00   Total pack years: 15.00   Types: Cigarettes   Quit date: 10/09/2021   Years since quitting: 1.4  Smokeless Tobacco Never  Tobacco Comments   5-10 cigarettes a day (plans to quit prior to this surgery 05/06/15)    Goals Met:  Proper associated with RPD/PD & O2 Sat Independence with exercise equipment Exercise tolerated well No report of concerns or symptoms today Strength training completed today  Goals Unmet:  Not Applicable  Comments: Service time is from 1308 to 1448.    Dr. Mechele Collin is  Medical Director for Pulmonary Rehab at [facility].

## 2023-04-04 NOTE — Telephone Encounter (Signed)
Sent a [employer] geologist. Nothing further needed.

## 2023-04-05 ENCOUNTER — Encounter (HOSPITAL_COMMUNITY)
Admission: RE | Admit: 2023-04-05 | Discharge: 2023-04-05 | Disposition: A | Discharge status: Home / Self Care | Payer: Medicare Other | Source: Ambulatory Visit | Attending: Internal Medicine | Admitting: Internal Medicine

## 2023-04-05 ENCOUNTER — Encounter (HOSPITAL_COMMUNITY): Payer: Medicare Other

## 2023-04-05 DIAGNOSIS — R911 Solitary pulmonary nodule: Secondary | ICD-10-CM | POA: Diagnosis not present

## 2023-04-05 DIAGNOSIS — R918 Other nonspecific abnormal finding of lung field: Secondary | ICD-10-CM | POA: Insufficient documentation

## 2023-04-05 DIAGNOSIS — C3490 Malignant neoplasm of unspecified part of unspecified bronchus or lung: Secondary | ICD-10-CM | POA: Diagnosis not present

## 2023-04-05 MED ORDER — FLUDEOXYGLUCOSE F - 18 (FDG) INJECTION
7.4700 | Freq: Once | INTRAVENOUS | Status: AC | PRN
  Administered 2023-04-05: 7.47 via INTRAVENOUS

## 2023-04-05 NOTE — Telephone Encounter (Signed)
Pt. Called back and gave my chart visit next week at 4:30pm

## 2023-04-07 NOTE — Progress Notes (Unsigned)
[facility] OFFICE PROGRESS NOTE  Ignatius Specking, MD [address] [place] [address]  DIAGNOSIS: Unresectable stage IIB (T2a, N1, M0) non-small cell lung cancer, squamous cell carcinoma presented with right lower lobe lung mass in addition to right hilar lymphadenopathy diagnosed in March 2023.  She developed a new RLL nodule in June 2024.    The patient had PD-L1 expression of 99%.  PRIOR THERAPY: A course of concurrent chemoradiation with weekly carboplatin for AUC of 2 and paclitaxel 45 Mg/M2. First dose started 01/16/2022. Status post 7 cycles. Last dose was given on 02/27/2022.   CURRENT THERAPY: Referral for consideration of SBRT   INTERVAL HISTORY: Bettina Warn 67 y.o. female returns to the clinic today for a follow-up visit accompanied by her husband.  The patient is currently on observation for her history of stage II lung cancer. The patient is status post treatment which was completed in May 2023. She has been on observation since that time and feeling fairly well except she struggles with chronic dyspnea on exertion for which she is on home oxygen. She reports her breathing has gradually worsened. She follows with Dr. Marchelle Gearing from pulmonary medicine and she is currently undergoing exercise rehab for her emphysema and interstitial lung disease. She also started a medication called Pirfenidone. She has some tolerability concerns with this related to weight loss and nausea. She she takes this 2x per day as opposed to 3 due to tolerability.  Dr. Ardyth Man swami ordered a CT angiogram on 03/26/2023 due to her worsening shortness of breath.  This was negative for PE but showed an increase in size of a 1.9 cm nodule in the posterior right lower lobe suspicious for carcinoma.  Therefore he subsequently arranged for a PET scan to further characterize this.   She was last seen by Dr. Arbutus Ped in April 2024.  Otherwise she denies any recent fever, chills, or night sweats.  She denies  any chest pain or hemoptysis.  He may have an intermittent cough, especially if she is active, but overall does not have a significant cough.  She continues to be on supplemental oxygen via nasal cannula for her interstitial lung disease.  She had an stomach bug a few weeks ago and had some nausea, vomiting, diarrhea which has mostly resolved at this time.  Denies any headache or visual changes. She is here today for evaluation and to review her scan results    MEDICAL HISTORY: Past Medical History:  Diagnosis Date   Anxiety    Arthritis    fingers, left foot   C. difficile diarrhea 2015   COPD (chronic obstructive pulmonary disease) (HCC)    Depression    Eczema    H pylori ulcer    Headache(784.0)    Hypercholesterolemia    Hypertension    Kidney stones    20 years ago   Pneumonia    5 years ago   Seizures (HCC)    2 years ago, "cluster of seizures" none since   Shingles     ALLERGIES:  is allergic to alpha-gal, other, and penicillins.  MEDICATIONS:  Current Outpatient Medications  Medication Sig Dispense Refill   albuterol (PROVENTIL) (5 MG/ML) 0.5% nebulizer solution Take 2.5 mg by nebulization every 6 (six) hours as needed for wheezing or shortness of breath.     albuterol (VENTOLIN HFA) 108 (90 Base) MCG/ACT inhaler Inhale 2 puffs into the lungs every 6 (six) hours as needed for wheezing or shortness of breath. 8  g 2   ALPRAZolam (XANAX) 0.5 MG tablet Take 0.5 mg by mouth daily as needed.     cetirizine (ZYRTEC) 10 MG tablet Take 10 mg by mouth at bedtime.     EPINEPHrine (AUVI-Q) 0.3 mg/0.3 mL IJ SOAJ injection Inject 0.3 mLs (0.3 mg total) into the muscle as needed for anaphylaxis. 2 each 1   fluticasone (FLONASE) 50 MCG/ACT nasal spray Place 2 sprays into both nostrils daily. (Patient taking differently: Place 2 sprays into both nostrils daily as needed for allergies.) 16 g 5   HYDROcodone bit-homatropine (HYCODAN) 5-1.5 MG/5ML syrup Take 5 mLs by mouth every 6 (six)  hours as needed for cough. 240 mL 0   omeprazole (PRILOSEC) 20 MG capsule Take 1 capsule (20 mg total) by mouth 2 (two) times daily. (Needs to be seen before next refill) 60 capsule 0   OVER THE COUNTER MEDICATION Take 1 capsule by mouth daily. Probiotic     Pirfenidone (ESBRIET) 267 MG TABS Take 3 tablets (801 mg total) by mouth 3 (three) times daily with meals. (Account: [email or web address]) 270 tablet 4   rosuvastatin (CRESTOR) 10 MG tablet Take 10 mg by mouth at bedtime.     Budeson-Glycopyrrol-Formoterol (BREZTRI AEROSPHERE) 160-9-4.8 MCG/ACT AERO Inhale 2 puffs into the lungs in the morning and at bedtime. (Patient not taking: Reported on 04/09/2023) 2 each 0   umeclidinium-vilanterol (ANORO ELLIPTA) 62.5-25 MCG/ACT AEPB Inhale 1 puff into the lungs daily. (Patient not taking: Reported on 04/09/2023) 60 each 3   No current facility-administered medications for this visit.    SURGICAL HISTORY:  Past Surgical History:  Procedure Laterality Date   ABDOMINAL HYSTERECTOMY     ADENOIDECTOMY     APPENDECTOMY     BRONCHIAL BIOPSY  12/20/2021   Procedure: BRONCHIAL BIOPSIES;  Surgeon: Josephine Igo, DO;  Location: [facility];  Service: Pulmonary;;   BRONCHIAL BRUSHINGS  12/20/2021   Procedure: BRONCHIAL BRUSHINGS;  Surgeon: Josephine Igo, DO;  Location: [facility];  Service: Pulmonary;;   BRONCHIAL NEEDLE ASPIRATION BIOPSY  12/20/2021   Procedure: BRONCHIAL NEEDLE ASPIRATION BIOPSIES;  Surgeon: Josephine Igo, DO;  Location: [facility];  Service: Pulmonary;;   CESAREAN SECTION     CHOLECYSTECTOMY     COLONOSCOPY  06/10/2012   RMR: Colonic polyps -removed as described above.    FINE NEEDLE ASPIRATION  12/20/2021   Procedure: FINE NEEDLE ASPIRATION (FNA) LINEAR;  Surgeon: Josephine Igo, DO;  Location: [facility];  Service: Pulmonary;;   KNEE ARTHROSCOPY  1973   right knee,  torn cart   LUMBAR DISC SURGERY  05/12/2015   L4 L5   LUMBAR LAMINECTOMY/DECOMPRESSION  MICRODISCECTOMY Left 05/12/2015   Procedure: Left L4-5 Microdiscectomy;  Surgeon: Eldred Manges, MD;  Location: [facility];  Service: Orthopedics;  Laterality: Left;   TONSILLECTOMY     VIDEO BRONCHOSCOPY WITH ENDOBRONCHIAL ULTRASOUND Bilateral 12/20/2021   Procedure: VIDEO BRONCHOSCOPY WITH ENDOBRONCHIAL ULTRASOUND;  Surgeon: Josephine Igo, DO;  Location: [facility];  Service: Pulmonary;  Laterality: Bilateral;   VIDEO BRONCHOSCOPY WITH RADIAL ENDOBRONCHIAL ULTRASOUND  12/20/2021   Procedure: RADIAL ENDOBRONCHIAL ULTRASOUND;  Surgeon: Josephine Igo, DO;  Location: [facility];  Service: Pulmonary;;    REVIEW OF SYSTEMS:   Review of Systems  Constitutional: positive for weight loss.  Negative for chills, fatigue, and fever.  HENT: Negative for mouth sores, nosebleeds, sore throat and trouble swallowing.   Eyes: Negative for eye problems and icterus.  Respiratory: Positive for chronic shortness of breath.  Positive for intermittent cough.  Negative for hemoptysis and wheezing.   Cardiovascular: Negative for chest pain and leg swelling.  Gastrointestinal: Positive for intermittent nausea and vomiting.  Negative for abdominal pain, constipation, and diarrhea.  Genitourinary: Negative for bladder incontinence, difficulty urinating, dysuria, frequency and hematuria.   Musculoskeletal: Negative for back pain, gait problem, neck pain and neck stiffness.  Skin: Negative for itching and rash.  Neurological: Negative for dizziness, extremity weakness, gait problem, headaches, light-headedness and seizures.  Hematological: Negative for adenopathy. Does not bruise/bleed easily.  Psychiatric/Behavioral: Negative for confusion, depression and sleep disturbance. The patient is not nervous/anxious.     PHYSICAL EXAMINATION:  Blood pressure 125/60, pulse 80, temperature 98.2 F (36.8 C), resp. rate 18, weight 130 lb 14.4 oz (59.4 kg), SpO2 100 %.  ECOG PERFORMANCE STATUS: 1  Physical Exam   Constitutional: Oriented to person, place, and time and well-developed, well-nourished, and in no distress.  HENT:  Head: Normocephalic and atraumatic.  Mouth/Throat: Oropharynx is clear and moist. No oropharyngeal exudate.  Eyes: Conjunctivae are normal. Right eye exhibits no discharge. Left eye exhibits no discharge. No scleral icterus.  Neck: Normal range of motion. Neck supple.  Cardiovascular: Normal rate, regular rhythm, normal heart sounds and intact distal pulses.   Pulmonary/Chest: Effort normal and breath sounds normal. No respiratory distress. No wheezes. No rales.  On supplemental oxygen via nasal cannula. Abdominal: Soft. Bowel sounds are normal. Exhibits no distension and no mass. There is no tenderness.  Musculoskeletal: Normal range of motion. Exhibits no edema.  Lymphadenopathy:    No cervical adenopathy.  Neurological: Alert and oriented to person, place, and time. Exhibits normal muscle tone. Gait normal. Coordination normal.  Skin: Skin is warm and dry. No rash noted. Not diaphoretic. No erythema. No pallor.  Psychiatric: Mood, memory and judgment normal.  Vitals reviewed.  LABORATORY DATA: Lab Results  Component Value Date   WBC 6.6 03/22/2023   HGB 10.7 (L) 03/22/2023   HCT 32.8 (L) 03/22/2023   MCV 92.3 03/22/2023   PLT 365.0 03/22/2023      Chemistry      Component Value Date/Time   NA 140 01/25/2023 0955   NA 139 12/03/2019 0928   K 4.1 01/25/2023 0955   CL 102 01/25/2023 0955   CO2 32 01/25/2023 0955   BUN 16 01/25/2023 0955   BUN 14 12/03/2019 0928   CREATININE 0.65 01/25/2023 0955   CREATININE 0.64 10/14/2021 0953      Component Value Date/Time   CALCIUM 9.8 01/25/2023 0955   ALKPHOS 68 03/22/2023 1559   AST 21 03/22/2023 1559   AST 16 01/25/2023 0955   ALT 15 03/22/2023 1559   ALT 11 01/25/2023 0955   BILITOT 0.3 03/22/2023 1559   BILITOT 0.3 01/25/2023 0955       RADIOGRAPHIC STUDIES:  NM PET Image Restage (PS) Skull Base to Thigh  (F-18 FDG)  Result Date: 04/09/2023 CLINICAL DATA:  Subsequent treatment strategy for lung carcinoma. Chemotherapy ongoing. Concern for increasing RIGHT lower lobe nodule. EXAM: NUCLEAR MEDICINE PET SKULL BASE TO THIGH TECHNIQUE: 7.47 mCi F-18 FDG was injected intravenously. Full-ring PET imaging was performed from the skull base to thigh after the radiotracer. CT data was obtained and used for attenuation correction and anatomic localization. Fasting blood glucose: 110 mg/dl COMPARISON:  CT 16/07/9603 com PET-CT 10/04/2022 FINDINGS: Mediastinal blood pool activity: SUV max 2.0 Liver activity: SUV max NA NECK: No hypermetabolic lymph nodes in the neck. Incidental CT findings: None. CHEST: Rounded focus  of intense metabolic activity in the RIGHT lower lobe measures 2.7 cm with SUV max equal 15.4. This corresponds to nodule of concern on comparison CT. Band of parenchymal consolidation in the more superior RIGHT lower lobe has mild metabolic activity favored post radiation change. Band parenchymal consolidation in the RIGHT upper lobe is also similar mild metabolic activity. No hypermetabolic mediastinal lymph nodes. Incidental CT findings: None. ABDOMEN/PELVIS: No abnormal hypermetabolic activity within the liver, pancreas, adrenal glands, or spleen. No hypermetabolic lymph nodes in the abdomen or pelvis. Incidental CT findings: None. SKELETON: No focal hypermetabolic activity to suggest skeletal metastasis. Incidental CT findings: None. IMPRESSION: 1. Persistent pulmonary nodule in the inferior RIGHT lower lobe with intense metabolic activity is concerning for lung cancer recurrence. 2. Consolidation in the superior aspect of the RIGHT lower lobe and RIGHT upper lobe is similar to prior and most consistent with post radiation change. 3. No hypermetabolic mediastinal lymph nodes. 4. No distant metastatic disease. Electronically Signed   By: Genevive Bi M.D.   On: 04/09/2023 10:22   DG Chest 2 View  Result  Date: 03/29/2023 CLINICAL DATA:  Shortness of breath. EXAM: CHEST - 2 VIEW COMPARISON:  Chest radiograph from 12/13/2022. FINDINGS: Redemonstration of chronic changes throughout bilateral lungs. There are underlying emphysematous changes/fibrosis. Redemonstration of irregular opacity overlying the right mid lung zone. No acute consolidation or major lung collapse. Bilateral costophrenic angles are clear. Stable cardio-mediastinal silhouette. No acute osseous abnormalities. The soft tissues are within normal limits. There are surgical clips in the right upper quadrant, typical of a previous cholecystectomy. IMPRESSION: No active cardiopulmonary disease. Stable chronic lung parenchymal changes. Electronically Signed   By: Jules Schick M.D.   On: 03/29/2023 15:38   CT Angio Chest W/Cm &/Or Wo Cm  Result Date: 03/26/2023 CLINICAL DATA:  Worsening shortness of breath and chest discomfort 3 weeks. Elevated D-dimer. Lung carcinoma. Undergoing chemotherapy. * Tracking Code: BO * EXAM: CT ANGIOGRAPHY CHEST WITH CONTRAST TECHNIQUE: Multidetector CT imaging of the chest was performed using the standard protocol during bolus administration of intravenous contrast. Multiplanar CT image reconstructions and MIPs were obtained to evaluate the vascular anatomy. RADIATION DOSE REDUCTION: This exam was performed according to the departmental dose-optimization program which includes automated exposure control, adjustment of the mA and/or kV according to patient size and/or use of iterative reconstruction technique. CONTRAST:  75mL OMNIPAQUE IOHEXOL 350 MG/ML SOLN COMPARISON:  01/25/2023 FINDINGS: Cardiovascular: Satisfactory opacification of pulmonary arteries noted, and no pulmonary emboli identified. No evidence of thoracic aortic dissection or aneurysm. Aortic and coronary atherosclerotic calcification incidentally noted. Mediastinum/Nodes: No masses or pathologically enlarged lymph nodes identified. Lungs/Pleura:  Centrilobular and paraseptal emphysema again noted. Chronic changes of subpleural fibrosis and honeycombing again seen with bibasilar predominance. Areas of confluent opacity and central bronchiectasis are again seen in the right upper and lower lobes, without significant change. A poorly defined nodular opacity in the posterior right lower lobe on image 95/5 currently measures 1.9 x 1.8 cm, compared to 1.2 x 1.0 cm previously. No other new or enlarging sites of pulmonary opacity identified. No evidence of pleural effusion. Upper abdomen: No acute findings. Musculoskeletal: No suspicious bone lesions identified. Review of the MIP images confirms the above findings. IMPRESSION: No evidence of pulmonary embolism. Increased size of 1.9 cm nodular opacity in posterior right lower lobe, suspicious for carcinoma. No significant change in areas of right lung airspace disease and bronchiectasis, likely due to post radiation changes. Chronic pulmonary emphysema and fibrosis. Aortic Atherosclerosis (ICD10-I70.0) and  Emphysema (ICD10-J43.9). Electronically Signed   By: Danae Orleans M.D.   On: 03/26/2023 16:10     ASSESSMENT/PLAN:  This is a very pleasant 67 year old Caucasian female with a history of unresectable stage IIb (T2a, N1, M0) non-small cell lung cancer, squamous cell carcinoma.  She presented with a right lower lobe lung mass in addition to right hilar lymphadenopathy.  She was diagnosed in March 2023 her PD-L1 expression is 99%.  She underwent a course of concurrent chemoradiation with weekly carboplatin for an AUC of 2 and paclitaxel 45 mg/m.  She is status post 7 cycles.  Her last dose was given on 02/28/2023.  She is currently on observation but she does have baseline shortness of breath and fatigue secondary to her COPD, radiation scaring,  and interstitial lung disease.  She is currently on supplemental oxygen and is followed closely by pulmonary medicine.  She was recently endorsing shortness of  breath so Dr. Marchelle Gearing from pulmonary medicine arrange for CT angiogram which showed a new 1.9 cm nodular lesion.  Therefore he arrange for a PET scan to further evaluate this.  The patient was seen with Dr. Arbutus Ped today.  Dr. Arbutus Ped personally and independently reviewed the PET scan and reviewed the results with the patient today.  The scan showed metabolism in the right lower lobe nodule.  Dr. Arbutus Ped recommends we referral back to Dr. Mitzi Hansen from radiation oncology for consideration of SBRT.  The patient is concerned about further radiation with her interstitial lung disease.  We will arrange for restaging CT scan the chest to be performed in 4 months.  We will see the patient back for follow-up visit here in 4 months for evaluation and to review her scan results.  The patient was originally scan for CT scan next week on 04/20/2023.  I have canceled her follow-up appointment and CT scan that was scheduled for July 2024.   The patient was advised to call immediately if she has any concerning symptoms in the interval. The patient voices understanding of current disease status and treatment options and is in agreement with the current care plan. All questions were answered. The patient knows to call the clinic with any problems, questions or concerns. We can certainly see the patient much sooner if necessary      Orders Placed This Encounter  Procedures   CT Chest W Contrast    Standing Status:   Future    Standing Expiration Date:   04/08/2024    Order Specific Question:   If indicated for the ordered procedure, I authorize the administration of contrast media per Radiology protocol    Answer:   Yes    Order Specific Question:   Does the patient have a contrast media/X-ray dye allergy?    Answer:   No    Order Specific Question:   Preferred imaging location?    Answer:   [facility]   CBC with Differential (Cancer Center Only)    Standing Status:   Future    Standing  Expiration Date:   04/08/2024   CMP (Cancer Center only)    Standing Status:   Future    Standing Expiration Date:   04/08/2024   Ambulatory referral to Radiation Oncology    Referral Priority:   Routine    Referral Type:   Consultation    Referral Reason:   Specialty Services Required    Requested Specialty:   Radiation Oncology    Number of Visits Requested:   1  Kingsley Herandez L Jannatul Wojdyla, PA-C 04/09/23  ADDENDUM: Hematology/Oncology Attending: I had a face-to-face encounter with the patient today.  I reviewed her records, lab, scans and recommended her care plan.  This is a very pleasant 67 years old white female with history of unresectable stage IIb non-small cell lung cancer, squamous cell carcinoma diagnosed in March 2023 status post a course of concurrent chemoradiation with weekly carboplatin and paclitaxel.  She has PD-L1 expression of 99%.  The patient has been on observation since that time but she was found to have new hypermetabolic right lower lobe nodule in June 2024.  She had a PET scan performed recently.  I personally and independently reviewed the PET scan images and discussed the result with the patient and her husband. There is no other evidence for metastatic disease in the chest or extrathoracic. I recommended for the patient to see Dr. Mitzi Hansen for consideration of SBRT to the new hypermetabolic right lower lobe nodule and then will continue to monitor her closely.  If she has any evidence for disease recurrence or progression in the future, she would be considered for systemic therapy either with single agent immunotherapy or a combination of chemoimmunotherapy. The patient will come back for follow-up visit in 4 months for evaluation and repeat CT scan of the chest for restaging of her disease. For the interstitial lung disease she will continue her routine follow-up visit and evaluation by Dr. Marchelle Gearing. The patient was advised to call immediately if she has any other  concerning symptoms in the interval. The total time spent in the appointment was 30 minutes. Disclaimer: This note was dictated with voice recognition software. Similar sounding words can inadvertently be transcribed and may be missed upon review. Lajuana Matte, MD

## 2023-04-09 ENCOUNTER — Telehealth: Payer: Self-pay | Admitting: Radiation Oncology

## 2023-04-09 ENCOUNTER — Other Ambulatory Visit: Payer: Self-pay | Admitting: Internal Medicine

## 2023-04-09 ENCOUNTER — Inpatient Hospital Stay: Payer: Medicare Other | Attending: Internal Medicine | Admitting: Physician Assistant

## 2023-04-09 ENCOUNTER — Other Ambulatory Visit: Payer: Self-pay

## 2023-04-09 VITALS — BP 125/60 | HR 80 | Temp 98.2°F | Resp 18 | Wt 130.9 lb

## 2023-04-09 DIAGNOSIS — R911 Solitary pulmonary nodule: Secondary | ICD-10-CM | POA: Diagnosis not present

## 2023-04-09 DIAGNOSIS — C3491 Malignant neoplasm of unspecified part of right bronchus or lung: Secondary | ICD-10-CM | POA: Diagnosis not present

## 2023-04-09 DIAGNOSIS — Z9981 Dependence on supplemental oxygen: Secondary | ICD-10-CM | POA: Insufficient documentation

## 2023-04-09 DIAGNOSIS — Z923 Personal history of irradiation: Secondary | ICD-10-CM | POA: Diagnosis not present

## 2023-04-09 DIAGNOSIS — J849 Interstitial pulmonary disease, unspecified: Secondary | ICD-10-CM | POA: Insufficient documentation

## 2023-04-09 DIAGNOSIS — C3431 Malignant neoplasm of lower lobe, right bronchus or lung: Secondary | ICD-10-CM | POA: Insufficient documentation

## 2023-04-09 DIAGNOSIS — J432 Centrilobular emphysema: Secondary | ICD-10-CM | POA: Insufficient documentation

## 2023-04-09 NOTE — Telephone Encounter (Signed)
Pt called and asked to change her appt to mychart video. Change made.

## 2023-04-10 ENCOUNTER — Encounter (HOSPITAL_COMMUNITY)
Admission: RE | Admit: 2023-04-10 | Discharge: 2023-04-10 | Disposition: A | Discharge status: Home / Self Care | Payer: Medicare Other | Source: Ambulatory Visit | Attending: Internal Medicine | Admitting: Internal Medicine

## 2023-04-10 VITALS — Wt 134.0 lb

## 2023-04-10 DIAGNOSIS — J849 Interstitial pulmonary disease, unspecified: Secondary | ICD-10-CM | POA: Diagnosis not present

## 2023-04-10 NOTE — Progress Notes (Signed)
Daily Session Note  Patient Details  Name: Diane [name] MRN: [id number] Date of Birth: 09/03/1956 Referring Provider:   Doristine Devoid Pulmonary Rehab Walk Test from 01/12/2023 in [facility]  Referring Provider Ramaswamy       Encounter Date: 04/10/2023  Check In:  Session Check In - 04/10/23 1431       Check-In   Supervising physician immediately available to respond to emergencies [facility] MD immediately available    Physician(s) Edd Fabian, NP    Location [facility]    Staff Present Elissa Lovett BS, ACSM-CEP, Exercise Physiologist;Kaylee Earlene Plater, MS, ACSM-CEP, Exercise Physiologist;Casey Synthia Innocent, RN, Sherron Flemings, RN, MSN    Virtual Visit No    Medication changes reported     No    Fall or balance concerns reported    No    Tobacco Cessation No Change    Warm-up and Cool-down Performed as group-led instruction    Resistance Training Performed Yes    VAD Patient? No    PAD/SET Patient? No      Pain Assessment   Currently in Pain? No/denies    Pain Score 0-No pain    Multiple Pain Sites No             Capillary Blood Glucose: No results found for this or any previous visit (from the past 24 hour(s)).    Social History   Tobacco Use  Smoking Status Former   Packs/day: 0.50   Years: 30.00   Additional pack years: 0.00   Total pack years: 15.00   Types: Cigarettes   Quit date: 10/09/2021   Years since quitting: 1.5  Smokeless Tobacco Never  Tobacco Comments   5-10 cigarettes a day (plans to quit prior to this surgery 05/06/15)    Goals Met:  Proper associated with RPD/PD & O2 Sat Independence with exercise equipment Improved SOB with ADL's Exercise tolerated well No report of concerns or symptoms today  Goals Unmet:  Not Applicable  Comments: Pt completed 6 min walk test and graduated. Service time is from 1300 to 1333.    Dr. Mechele Collin is  Medical Director for Pulmonary Rehab at [facility].

## 2023-04-10 NOTE — Progress Notes (Signed)
Discharge Progress Report  Patient Details  Name: Beatrice Ziehm MRN: [id number] Date of Birth: 11-24-1955 Referring Provider:   Doristine Devoid Pulmonary Rehab Walk Test from 01/12/2023 in [facility]  Referring Provider Ramaswamy        Number of Visits: 21  Reason for Discharge:  Patient reached a stable level of exercise. Patient independent in their exercise. Patient has met program and personal goals.  Smoking History:  Social History   Tobacco Use  Smoking Status Former   Packs/day: 0.50   Years: 30.00   Additional pack years: 0.00   Total pack years: 15.00   Types: Cigarettes   Quit date: 10/09/2021   Years since quitting: 1.5  Smokeless Tobacco Never  Tobacco Comments   5-10 cigarettes a day (plans to quit prior to this surgery 05/06/15)    Diagnosis:  ILD (interstitial lung disease) (HCC)  ADL [facility]:  Pulmonary Assessment Scores     Row Name 01/12/23 1436 04/03/23 1527 04/10/23 1531     ADL [facility]   ADL Phase -- Exit Exit   SOB Score total 84 75 --     CAT Score   CAT Score 30 28 --     mMRC Score   mMRC Score 3 -- 4            Initial Exercise Prescription:  Initial Exercise Prescription - 01/12/23 1500       Date of Initial Exercise RX and Referring Provider   Date 01/12/23    Referring Provider Ramaswamy    Expected Discharge Date 04/10/23      Oxygen   Oxygen Continuous    Liters 10    Maintain Oxygen Saturation 88% or higher      Recumbant Elliptical   Level 1    RPM 25    Minutes 15      Track   Minutes 15    METs 2.56      Prescription Details   Frequency (times per week) 2    Duration Progress to 30 minutes of continuous aerobic without signs/symptoms of physical distress      Intensity   THRR 40-80% of Max Heartrate 62-124    Ratings of Perceived Exertion 11-13    Perceived Dyspnea 0-4      Progression   Progression Continue to progress workloads to  maintain intensity without signs/symptoms of physical distress.      Resistance Training   Training Prescription Yes    Weight red bands    Reps 10-15             Discharge Exercise Prescription (Final Exercise Prescription Changes):  Exercise Prescription Changes - 03/27/23 1500       Response to Exercise   Blood Pressure (Admit) 94/52    Blood Pressure (Exercise) 108/60    Blood Pressure (Exit) 100/54    Heart Rate (Admit) 84 bpm    Heart Rate (Exercise) 108 bpm    Heart Rate (Exit) 93 bpm    Oxygen Saturation (Admit) 100 %   4L   Oxygen Saturation (Exercise) 91 %   8L   Oxygen Saturation (Exit) 91 %   4L   Rating of Perceived Exertion (Exercise) 12    Perceived Dyspnea (Exercise) 2    Duration Continue with 30 min of aerobic exercise without signs/symptoms of physical distress.    Intensity THRR unchanged      Progression   Progression Continue to progress workloads  to maintain intensity without signs/symptoms of physical distress.      Resistance Training   Training Prescription Yes    Weight black bands    Reps 10-15    Time 10 Minutes      Oxygen   Oxygen Continuous    Liters 4-8L      Treadmill   MPH 3    Grade 3    Minutes 15    METs 4.54      Recumbant Elliptical   Level 3    Minutes 15    METs 4.1      Oxygen   Maintain Oxygen Saturation 88% or higher             Functional Capacity:  6 Minute Walk     Row Name 01/12/23 1451 04/10/23 1527       6 Minute Walk   Phase Initial Discharge    Distance 758 feet 1570 feet    Distance % Change -- 107 %    Distance Feet Change -- 812 ft    Walk Time 6 minutes 6 minutes    # of Rest Breaks 2 0    MPH 1.44 2.97    METS 2.56 4.16    RPE 11 11    Perceived Dyspnea  3 2    VO2 Peak 8.97 14.55    Symptoms Yes (comment) No    Comments dyspnea, stopped patient for O2 sat <88% at 2:09, 2:41, and 5:25 --    Resting HR 93 bpm 96 bpm    Resting BP 104/64 122/60    Resting Oxygen Saturation   99 % 100 %    Exercise Oxygen Saturation  during 6 min walk 6 % 90 %    Max Ex. HR 119 bpm 113 bpm    Max Ex. BP 124/70 140/62    2 Minute Post BP 112/66 124/64      Interval HR   1 Minute HR 109 100    2 Minute HR 116 105    3 Minute HR 112 110    4 Minute HR 112 110    5 Minute HR 117 110    6 Minute HR 119 113    2 Minute Post HR 103 98    Interval Heart Rate? Yes Yes      Interval Oxygen   Interval Oxygen? Yes Yes    Baseline Oxygen Saturation % 99 % 100 %    1 Minute Oxygen Saturation % 92 % 100 %    1 Minute Liters of Oxygen 6 L 10 L    2 Minute Oxygen Saturation % 88 %  at 2:09 O2 82%, increased to 8L at 2:41 O2 84%, increased to 10L 97 %    2 Minute Liters of Oxygen 6 L  at 2:09 O2 82%, increased to 8L 10 L    3 Minute Oxygen Saturation % 90 % 90 %    3 Minute Liters of Oxygen 10 L 10 L    4 Minute Oxygen Saturation % 94 % 94 %    4 Minute Liters of Oxygen 10 L 10 L    5 Minute Oxygen Saturation % 92 %  5:45 O2 86%, PLB performed, O2 88% 92 %    5 Minute Liters of Oxygen 10 L  5:45 O2 86%, PLB performed, O2 88% 10 L    6 Minute Oxygen Saturation % 88 % 94 %    6 Minute Liters of Oxygen 10  L 10 L    2 Minute Post Oxygen Saturation % 99 %  4 min post O2 97%, HR 101, on 4L 100 %    2 Minute Post Liters of Oxygen 6 L  4 min post O2 97%, HR 101, on 4L 6 L             Psychological, QOL, Others - Outcomes: PHQ 2/9:    04/03/2023    3:28 PM 01/12/2023    2:39 PM 07/25/2022    8:51 AM 06/15/2020    8:14 AM 06/11/2020    9:08 AM  Depression screen PHQ 2/9  Decreased Interest 1 2 0 0 0  Down, Depressed, Hopeless 1 2 0 0 0  PHQ - 2 Score 2 4 0 0 0  Altered sleeping 1 1 1     Tired, decreased energy 1 2 0    Change in appetite 1 1 0    Feeling bad or failure about yourself  0 1 0    Trouble concentrating 1 1 1     Moving slowly or fidgety/restless 0 0 0    Suicidal thoughts 0 1 0    PHQ-9 Score 6 11 2     Difficult doing work/chores Not difficult at all Somewhat  difficult Not difficult at all      Quality of Life:   Personal Goals: Goals established at orientation with interventions provided to work toward goal.  Personal Goals and Risk Factors at Admission - 01/12/23 1335       Core Components/Risk Factors/Patient Goals on Admission    Weight Management Weight Maintenance    Improve shortness of breath with ADL's Yes    Intervention Provide education, individualized exercise plan and daily activity instruction to help decrease symptoms of SOB with activities of daily living.    Expected Outcomes Short Term: Improve cardiorespiratory fitness to achieve a reduction of symptoms when performing ADLs;Long Term: Be able to perform more ADLs without symptoms or delay the onset of symptoms    Increase knowledge of respiratory medications and ability to use respiratory devices properly  Yes    Intervention Provide education and demonstration as needed of appropriate use of medications, inhalers, and oxygen therapy.    Expected Outcomes Short Term: Achieves understanding of medications use. Understands that oxygen is a medication prescribed by physician. Demonstrates appropriate use of inhaler and oxygen therapy.;Long Term: Maintain appropriate use of medications, inhalers, and oxygen therapy.              Personal Goals Discharge:  Goals and Risk Factor Review     Row Name 01/17/23 1537 02/23/23 0838 03/27/23 1643         Core Components/Risk Factors/Patient Goals Review   Personal Goals Review Weight Management/Obesity;Improve shortness of breath with ADL's;Develop more efficient breathing techniques such as purse lipped breathing and diaphragmatic breathing and practicing self-pacing with activity.;Increase knowledge of respiratory medications and ability to use respiratory devices properly. Weight Management/Obesity;Improve shortness of breath with ADL's;Develop more efficient breathing techniques such as purse lipped breathing and diaphragmatic  breathing and practicing self-pacing with activity.;Increase knowledge of respiratory medications and ability to use respiratory devices properly. Weight Management/Obesity;Improve shortness of breath with ADL's     Review Unable to assess goals since Bronson Curb has not started the program yet. She is scheduled to start on 01/18/2023 Scotty has met her goal of increasing her knowledge of respiratory meds and using them properly. She has correctly stated when to use her inhaler and has properly demonstrated  it with our respiratory therapist. She has also met her goal of developing more efficient breathing techniques and self-pacing. Geraldine can initiate pursed lip breathing when she is short of breath without staff involvement. Aerionna is still working on improving her shortness of breath but feels like PR is helping. Her oxygen saturation has maintained on 8L while exercising. Marlena is working with our dietician on maintaining her weight. Since the start of Pirfenidone she has been consistently losing weight. She is striving to achieve her goal of weight gain and weight maintenance. Goals have not been met to improve shortness of breath with ADLs. Corrinna is requiring more oxygen due to her disease progression. After starting Pirfenidone, Shy has lost weight due to the GI side effects.  She has been working with our dietitian to gain weight (see dietician notes). Jerre has met her other 2 goals of increasing her knowledge for respiratory medications and developing more efficient breathing techniques.     Expected Outcomes See Admission Goals See Admission Goals See Admission Goals              Exercise Goals and Review:  Exercise Goals     Row Name 01/12/23 1337 01/15/23 1344 02/20/23 0941 03/27/23 0751       Exercise Goals   Increase Physical Activity Yes Yes Yes Yes    Intervention Provide advice, education, support and counseling about physical activity/exercise needs.;Develop an  individualized exercise prescription for aerobic and resistive training based on initial evaluation findings, risk stratification, comorbidities and participant's personal goals. Provide advice, education, support and counseling about physical activity/exercise needs.;Develop an individualized exercise prescription for aerobic and resistive training based on initial evaluation findings, risk stratification, comorbidities and participant's personal goals. Provide advice, education, support and counseling about physical activity/exercise needs.;Develop an individualized exercise prescription for aerobic and resistive training based on initial evaluation findings, risk stratification, comorbidities and participant's personal goals. Provide advice, education, support and counseling about physical activity/exercise needs.;Develop an individualized exercise prescription for aerobic and resistive training based on initial evaluation findings, risk stratification, comorbidities and participant's personal goals.    Expected Outcomes Short Term: Attend rehab on a regular basis to increase amount of physical activity.;Long Term: Exercising regularly at least 3-5 days a week.;Long Term: Add in home exercise to make exercise part of routine and to increase amount of physical activity. Short Term: Attend rehab on a regular basis to increase amount of physical activity.;Long Term: Exercising regularly at least 3-5 days a week.;Long Term: Add in home exercise to make exercise part of routine and to increase amount of physical activity. Short Term: Attend rehab on a regular basis to increase amount of physical activity.;Long Term: Exercising regularly at least 3-5 days a week.;Long Term: Add in home exercise to make exercise part of routine and to increase amount of physical activity. Short Term: Attend rehab on a regular basis to increase amount of physical activity.;Long Term: Exercising regularly at least 3-5 days a week.;Long  Term: Add in home exercise to make exercise part of routine and to increase amount of physical activity.    Increase Strength and Stamina Yes Yes Yes Yes    Intervention Provide advice, education, support and counseling about physical activity/exercise needs.;Develop an individualized exercise prescription for aerobic and resistive training based on initial evaluation findings, risk stratification, comorbidities and participant's personal goals. Provide advice, education, support and counseling about physical activity/exercise needs.;Develop an individualized exercise prescription for aerobic and resistive training based on initial evaluation findings, risk stratification, comorbidities  and participant's personal goals. Provide advice, education, support and counseling about physical activity/exercise needs.;Develop an individualized exercise prescription for aerobic and resistive training based on initial evaluation findings, risk stratification, comorbidities and participant's personal goals. Provide advice, education, support and counseling about physical activity/exercise needs.;Develop an individualized exercise prescription for aerobic and resistive training based on initial evaluation findings, risk stratification, comorbidities and participant's personal goals.    Expected Outcomes Short Term: Increase workloads from initial exercise prescription for resistance, speed, and METs.;Short Term: Perform resistance training exercises routinely during rehab and add in resistance training at home;Long Term: Improve cardiorespiratory fitness, muscular endurance and strength as measured by increased METs and functional capacity ( ) Short Term: Increase workloads from initial exercise prescription for resistance, speed, and METs.;Short Term: Perform resistance training exercises routinely during rehab and add in resistance training at home;Long Term: Improve cardiorespiratory fitness, muscular endurance and  strength as measured by increased METs and functional capacity ( ) Short Term: Increase workloads from initial exercise prescription for resistance, speed, and METs.;Short Term: Perform resistance training exercises routinely during rehab and add in resistance training at home;Long Term: Improve cardiorespiratory fitness, muscular endurance and strength as measured by increased METs and functional capacity ( ) Short Term: Increase workloads from initial exercise prescription for resistance, speed, and METs.;Short Term: Perform resistance training exercises routinely during rehab and add in resistance training at home;Long Term: Improve cardiorespiratory fitness, muscular endurance and strength as measured by increased METs and functional capacity ( )    Able to understand and use rate of perceived exertion (RPE) scale Yes Yes Yes Yes    Intervention Provide education and explanation on how to use RPE scale Provide education and explanation on how to use RPE scale Provide education and explanation on how to use RPE scale Provide education and explanation on how to use RPE scale    Expected Outcomes Short Term: Able to use RPE daily in rehab to express subjective intensity level;Long Term:  Able to use RPE to guide intensity level when exercising independently Short Term: Able to use RPE daily in rehab to express subjective intensity level;Long Term:  Able to use RPE to guide intensity level when exercising independently Short Term: Able to use RPE daily in rehab to express subjective intensity level;Long Term:  Able to use RPE to guide intensity level when exercising independently Short Term: Able to use RPE daily in rehab to express subjective intensity level;Long Term:  Able to use RPE to guide intensity level when exercising independently    Able to understand and use Dyspnea scale Yes Yes Yes Yes    Intervention Provide education and explanation on how to use Dyspnea scale Provide education and  explanation on how to use Dyspnea scale Provide education and explanation on how to use Dyspnea scale Provide education and explanation on how to use Dyspnea scale    Expected Outcomes Short Term: Able to use Dyspnea scale daily in rehab to express subjective sense of shortness of breath during exertion;Long Term: Able to use Dyspnea scale to guide intensity level when exercising independently Short Term: Able to use Dyspnea scale daily in rehab to express subjective sense of shortness of breath during exertion;Long Term: Able to use Dyspnea scale to guide intensity level when exercising independently Short Term: Able to use Dyspnea scale daily in rehab to express subjective sense of shortness of breath during exertion;Long Term: Able to use Dyspnea scale to guide intensity level when exercising independently Short Term: Able to use Dyspnea scale daily in rehab  to express subjective sense of shortness of breath during exertion;Long Term: Able to use Dyspnea scale to guide intensity level when exercising independently    Knowledge and understanding of Target Heart Rate Range (THRR) Yes Yes Yes Yes    Intervention Provide education and explanation of THRR including how the numbers were predicted and where they are located for reference Provide education and explanation of THRR including how the numbers were predicted and where they are located for reference Provide education and explanation of THRR including how the numbers were predicted and where they are located for reference Provide education and explanation of THRR including how the numbers were predicted and where they are located for reference    Expected Outcomes Short Term: Able to state/look up THRR;Short Term: Able to use daily as guideline for intensity in rehab;Long Term: Able to use THRR to govern intensity when exercising independently Short Term: Able to state/look up THRR;Short Term: Able to use daily as guideline for intensity in rehab;Long Term:  Able to use THRR to govern intensity when exercising independently Short Term: Able to state/look up THRR;Short Term: Able to use daily as guideline for intensity in rehab;Long Term: Able to use THRR to govern intensity when exercising independently Short Term: Able to state/look up THRR;Short Term: Able to use daily as guideline for intensity in rehab;Long Term: Able to use THRR to govern intensity when exercising independently    Understanding of Exercise Prescription Yes Yes Yes Yes    Intervention Provide education, explanation, and written materials on patient's individual exercise prescription Provide education, explanation, and written materials on patient's individual exercise prescription Provide education, explanation, and written materials on patient's individual exercise prescription Provide education, explanation, and written materials on patient's individual exercise prescription    Expected Outcomes Short Term: Able to explain program exercise prescription;Long Term: Able to explain home exercise prescription to exercise independently Short Term: Able to explain program exercise prescription;Long Term: Able to explain home exercise prescription to exercise independently Short Term: Able to explain program exercise prescription;Long Term: Able to explain home exercise prescription to exercise independently Short Term: Able to explain program exercise prescription;Long Term: Able to explain home exercise prescription to exercise independently             Exercise Goals Re-Evaluation:  Exercise Goals Re-Evaluation     Row Name 01/15/23 1344 02/20/23 0941 03/27/23 0752         Exercise Goal Re-Evaluation   Exercise Goals Review Increase Physical Activity;Able to understand and use Dyspnea scale;Understanding of Exercise Prescription;Increase Strength and Stamina;Able to understand and use rate of perceived exertion (RPE) scale;Knowledge and understanding of Target Heart Rate Range  (THRR) Increase Physical Activity;Able to understand and use Dyspnea scale;Understanding of Exercise Prescription;Increase Strength and Stamina;Able to understand and use rate of perceived exertion (RPE) scale;Knowledge and understanding of Target Heart Rate Range (THRR) Increase Physical Activity;Able to understand and use Dyspnea scale;Understanding of Exercise Prescription;Increase Strength and Stamina;Able to understand and use rate of perceived exertion (RPE) scale;Knowledge and understanding of Target Heart Rate Range (THRR)     Comments Pt to begin exercising this week. It is too early to discern progression. Pt has completed 8 exercise sessions. She missed one session for illness. She is exercising on the treadmill for 15 min, speed 2.5, incline 1 with METs 3.26. She then is exercising on the recumbent elliptical at level 3, METs 4.6, for 15 min. She is very motivated and pushes herself. Performs warm up and cool down unlimited.  Will continue to progress as tolerated. Pt has completed 17 exercise sessions. She has missed a couple sessions for appointments and illness but is very motivated. She is exercising on the treadmill for 15 min, speed 2.5, incline 3 with METs 3.78. She then is exercising on the recumbent elliptical at level 3, METs 4.7, for 15 min. She attempted to increase her workloads last session but had higher O2 demands.  Will continue to progress as tolerated. She is scheduled to graduate 6/25.     Expected Outcomes Through exercise at rehab and home the patient will decrease shortness of breath with daily activities and feel confident in carrying out an exercise regimn at home Through exercise at rehab and home the patient will decrease shortness of breath with daily activities and feel confident in carrying out an exercise regimn at home Through exercise at rehab and home the patient will decrease shortness of breath with daily activities and feel confident in carrying out an exercise  regimn at home.              Nutrition & Weight - Outcomes:   Post Biometrics - 04/10/23 1531        Post  Biometrics   Grip Strength 24 kg             Nutrition:  Nutrition Therapy & Goals - 03/20/23 1340       Nutrition Therapy   Diet General Healthy Diet    Drug/Food Interactions Statins/Certain Fruits      Personal Nutrition Goals   Nutrition Goal Patient to improve diet quality by using the plate method as a guide for meal planning to include lean protein/plant protein, fruits, vegetables, whole grains, nonfat dairy as part of a well-balanced diet.    Personal Goal #2 Patient to identify strategies for weight gain of 0.5-2.0# per week.    Comments Goals not met. Livvy recently decreased dose of Esbriet due to GI side effects and decreased appetite. However, she continues to lose weight; she is down 10.8# since starting with our program. WE have discussed multiple strategies for weight gain including Nutrition supplements, increasing calories from protein/fat/carbohydrates, and increasing eating frequency. She reports that she continues to eat three meals daily and has been slow to adopt weight gain strategies. Will continue to montor weekly weights. She has medical history of Alpha Gal, squamous cell carcinoma of right lung. Laterria will continue to benefit from pulmonary rehab for nutrition and exercise support.      Intervention Plan   Intervention Prescribe, educate and counsel regarding individualized specific dietary modifications aiming towards targeted core components such as weight, hypertension, lipid management, diabetes, heart failure and other comorbidities.;Nutrition handout(s) given to patient.    Expected Outcomes Short Term Goal: Understand basic principles of dietary content, such as calories, fat, sodium, cholesterol and nutrients.;Long Term Goal: Adherence to prescribed nutrition plan.             Nutrition Discharge:  Nutrition Assessments -  04/09/23 1016       Rate Your Plate Scores   Post Score 48             Education Questionnaire Score:  Knowledge Questionnaire Score - 04/03/23 1528       Knowledge Questionnaire Score   Post Score 18/18             Goals reviewed with patient; copy given to patient.

## 2023-04-11 ENCOUNTER — Ambulatory Visit: Payer: Medicare Other | Admitting: Radiation Oncology

## 2023-04-11 ENCOUNTER — Ambulatory Visit
Admission: RE | Admit: 2023-04-11 | Discharge: 2023-04-11 | Disposition: A | Discharge status: Home / Self Care | Payer: Medicare Other | Source: Ambulatory Visit | Attending: Radiation Oncology | Admitting: Radiation Oncology

## 2023-04-11 ENCOUNTER — Ambulatory Visit: Payer: Medicare Other

## 2023-04-11 ENCOUNTER — Encounter: Payer: Self-pay | Admitting: Radiation Oncology

## 2023-04-11 VITALS — Ht 66.0 in | Wt 133.0 lb

## 2023-04-11 DIAGNOSIS — C3491 Malignant neoplasm of unspecified part of right bronchus or lung: Secondary | ICD-10-CM

## 2023-04-11 DIAGNOSIS — C3431 Malignant neoplasm of lower lobe, right bronchus or lung: Secondary | ICD-10-CM

## 2023-04-11 DIAGNOSIS — J849 Interstitial pulmonary disease, unspecified: Secondary | ICD-10-CM | POA: Insufficient documentation

## 2023-04-11 HISTORY — DX: Interstitial pulmonary disease, unspecified: J84.9

## 2023-04-11 NOTE — Progress Notes (Addendum)
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 A diagnosis of Interstitial lung disease (HCC) was also pertinent to this visit.   -------------------------------------------------------------------------------------------------------------------------------------------------------- Addendum Please see the note from Laurence Aly, PA-C from today's visit for more details of today's encounter.  I have personally performed a face to face diagnostic evaluation on this patient and devised the following assessment and plan.  The patient underwent evaluation for her diagnosis of non-small cell lung cancer of the right lower lobe.  The patient has completed definitive treatment for stage III disease in May 2023.  Recent imaging shows an additional lesion consistent with a metachronous stage I primary tumor.  This measures 2.7 cm within the inferior right lower lobe and had a maximum SUV of 15.4 on PET scan.  The patient is not a good candidate for further workup given her lung disease and also is not a surgical candidate.  I have been asked to see the patient for consideration of SBRT.  The major risk was discussed with the patient today which would consist of further fibrosis of the lung.  We contrasted this against options including surgery and observation.  Given the location of the tumor, I would be hopeful that we would be able to minimize the treatment to the surrounding lung, especially with its proximity to the liver which would receive some treatment at the dome.  All of her questions were answered and she indicated that she does wish to proceed with such a treatment.  She therefore  will be scheduled for simulation in the near future for treatment planning.  Staging-clinical stage I lung cancer of the right lower lobe, with a history of stage III non-small cell lung cancer    Radene Gunning, MD, PhD --------------------------------------------------------------------------------------------------------------------------------------------------------   HISTORY OF PRESENT ILLNESS: Diane [name] is a 67 y.o. female with a history of  Stage IIB, cT2aN1M0, NSCLC , Squamous Cell Carcinoma of the RLL. She was diagnosed after being treated for pneumonia with a persistent nodule in the right lung. A PET scan on 12/13/21 revealed hypermetabolic changes in the RLL with an SUV of 24.4 was noted as well as right hilar nodal disease with SUV of 9.9. No evidence of metastatic disease was noted. And Bronchoscopy on 12/20/21 showed malignant cells consistent with squamous cell carcinoma in the fine-needle aspirate of the right lower lobe and rare atypical cells in the brushing specimen. The 10R node was also consistent with non small cell carcinoma.  She proceeded with chemoradiation which she completed in May 2023. She has been in observation following this.  She was noted to have a new nodule in the RLL in December 2023 which measured 1.9 cm. She had a PET on 10/12/22 showed the new nodule having an SUV of 4.6, and measured 1.2 cm, there was another RUL lesion with an SUV of 5.6, and post radiation changes in the right lower lobe consistent with her prior treatment with an SUV of 4.2. it was felt there may be inflammatory changes in the RLL nodule site, and she was followed. In April 2024 it was about 1.2-1.4 cm.  She had  persistent dyspnea and undergoing pulmonary  rehabilitation for emphysema and interstitial lung disease, and she had a CT PA to rule out PE on 03/26/23 and this showed an increase in the nodule in the RLL measuring 1.9 cm (previously 1.2 cm in April 2024). A PET scan was  recommended and performed on 04/05/23 and this measured 2.7 cm, with an SUV of 15.4. She's seen today via Mychart  to consider additional radiotherapy.   PREVIOUS RADIATION THERAPY   Radiation Treatment Dates: 01/16/2022 through 03/01/2022 Site Technique Total Dose (Gy) Dose per Fx (Gy) Completed Fx Beam Energies  Lung, Right: Lung_R 3D 60/60 2 30/30 6X  Lung, Right: Lung_R_Bst 3D 6/6 2 3/3 6X     PAST MEDICAL HISTORY:  Past Medical History:  Diagnosis Date   Anxiety    Arthritis    fingers, left foot   C. difficile diarrhea 2015   COPD (chronic obstructive pulmonary disease) (HCC)    Depression    Eczema    H pylori ulcer    Headache(784.0)    Hypercholesterolemia    Hypertension    Interstitial lung disease (HCC)    Kidney stones    20 years ago   Pneumonia    5 years ago   Seizures (HCC)    2 years ago, "cluster of seizures" none since   Shingles        PAST SURGICAL HISTORY: Past Surgical History:  Procedure Laterality Date   ABDOMINAL HYSTERECTOMY     ADENOIDECTOMY     APPENDECTOMY     BRONCHIAL BIOPSY  12/20/2021   Procedure: BRONCHIAL BIOPSIES;  Surgeon: Josephine Igo, DO;  Location: [facility];  Service: Pulmonary;;   BRONCHIAL BRUSHINGS  12/20/2021   Procedure: BRONCHIAL BRUSHINGS;  Surgeon: Josephine Igo, DO;  Location: [facility];  Service: Pulmonary;;   BRONCHIAL NEEDLE ASPIRATION BIOPSY  12/20/2021   Procedure: BRONCHIAL NEEDLE ASPIRATION BIOPSIES;  Surgeon: Josephine Igo, DO;  Location: [facility];  Service: Pulmonary;;   CESAREAN SECTION     CHOLECYSTECTOMY     COLONOSCOPY  06/10/2012   RMR: Colonic polyps -removed as described above.    FINE NEEDLE ASPIRATION  12/20/2021   Procedure: FINE NEEDLE ASPIRATION (FNA) LINEAR;  Surgeon: Josephine Igo, DO;  Location: [facility];  Service: Pulmonary;;   KNEE ARTHROSCOPY  1973   right knee,  torn cart   LUMBAR DISC SURGERY  05/12/2015   L4 L5   LUMBAR LAMINECTOMY/DECOMPRESSION MICRODISCECTOMY  Left 05/12/2015   Procedure: Left L4-5 Microdiscectomy;  Surgeon: Eldred Manges, MD;  Location: [facility];  Service: Orthopedics;  Laterality: Left;   TONSILLECTOMY     VIDEO BRONCHOSCOPY WITH ENDOBRONCHIAL ULTRASOUND Bilateral 12/20/2021   Procedure: VIDEO BRONCHOSCOPY WITH ENDOBRONCHIAL ULTRASOUND;  Surgeon: Josephine Igo, DO;  Location: [facility];  Service: Pulmonary;  Laterality: Bilateral;   VIDEO BRONCHOSCOPY WITH RADIAL ENDOBRONCHIAL ULTRASOUND  12/20/2021   Procedure: RADIAL ENDOBRONCHIAL ULTRASOUND;  Surgeon: Josephine Igo, DO;  Location: [facility];  Service: Pulmonary;;     FAMILY HISTORY:  Family History  Problem Relation Age of Onset   Arthritis Mother    Asthma Mother    Depression Mother    Hyperlipidemia Mother    Varicose Veins Mother    Arthritis Father    Heart disease Father    Hyperlipidemia Father    Vision loss Father    Cancer Sister    COPD Sister    Early death Sister    Cancer Brother    Alcohol abuse Maternal Grandfather  Colon cancer Neg Hx      SOCIAL HISTORY:  reports that she quit smoking about 18 months ago. Her smoking use included cigarettes. She has a 15.00 pack-year smoking history. She has never used smokeless tobacco. She reports current alcohol use of about 3.0 standard drinks of alcohol per week. She reports that she does not use drugs. The patient is married and accompanied by her husband. They live in [place]. She is in an Psychologist, educational for [employer]. She enjoys spending time travelling and with her family.     ALLERGIES: Alpha-gal, Other, and Penicillins   MEDICATIONS:  Current Outpatient Medications  Medication Sig Dispense Refill   albuterol (PROVENTIL) (5 MG/ML) 0.5% nebulizer solution Take 2.5 mg by nebulization every 6 (six) hours as needed for wheezing or shortness of breath.     albuterol (VENTOLIN HFA) 108 (90 Base) MCG/ACT inhaler Inhale 2 puffs into the lungs every 6 (six) hours as needed for wheezing or  shortness of breath. 8 g 2   ALPRAZolam (XANAX) 0.5 MG tablet Take 0.5 mg by mouth daily as needed.     Budeson-Glycopyrrol-Formoterol (BREZTRI AEROSPHERE) 160-9-4.8 MCG/ACT AERO Inhale 2 puffs into the lungs in the morning and at bedtime. (Patient not taking: Reported on 04/09/2023) 2 each 0   cetirizine (ZYRTEC) 10 MG tablet Take 10 mg by mouth at bedtime.     EPINEPHrine (AUVI-Q) 0.3 mg/0.3 mL IJ SOAJ injection Inject 0.3 mLs (0.3 mg total) into the muscle as needed for anaphylaxis. 2 each 1   fluticasone (FLONASE) 50 MCG/ACT nasal spray Place 2 sprays into both nostrils daily. (Patient taking differently: Place 2 sprays into both nostrils daily as needed for allergies.) 16 g 5   HYDROcodone bit-homatropine (HYCODAN) 5-1.5 MG/5ML syrup Take 5 mLs by mouth every 6 (six) hours as needed for cough. 240 mL 0   omeprazole (PRILOSEC) 20 MG capsule Take 1 capsule (20 mg total) by mouth 2 (two) times daily. (Needs to be seen before next refill) 60 capsule 0   OVER THE COUNTER MEDICATION Take 1 capsule by mouth daily. Probiotic     Pirfenidone (ESBRIET) 267 MG TABS Take 3 tablets (801 mg total) by mouth 3 (three) times daily with meals. (Account: [email or web address]) 270 tablet 4   rosuvastatin (CRESTOR) 10 MG tablet Take 10 mg by mouth at bedtime.     umeclidinium-vilanterol (ANORO ELLIPTA) 62.5-25 MCG/ACT AEPB Inhale 1 puff into the lungs daily. (Patient not taking: Reported on 04/09/2023) 60 each 3   No current facility-administered medications for this encounter.     REVIEW OF SYSTEMS: On review of systems, the patient reports that she is doing okay overall. She states she is using 6-8 L when she's active, and up to 15L during exercise. She denies any significant improvement in her breathing or O2 levels since her pulmonary rehabilitation. She is struggling with nausea related to her pirfenidone, and feels very fatigued and frustrated that she can't enjoy time in the sun. No other complaints  are verbalized.      PHYSICAL EXAM:  Wt Readings from Last 3 Encounters:  04/11/23 133 lb (60.3 kg)  04/10/23 134 lb 0.6 oz (60.8 kg)  04/09/23 130 lb 14.4 oz (59.4 kg)      Pain Assessment Pain Score: 0-No pain/10  In general this is a well appearing caucasian in no acute distress. She's alert and oriented x4 and appropriate throughout the examination. Cardiopulmonary assessment is negative for acute distress and she exhibits normal effort.  ECOG = 1  0 - Asymptomatic (Fully active, able to carry on all predisease activities without restriction)  1 - Symptomatic but completely ambulatory (Restricted in physically strenuous activity but ambulatory and able to carry out work of a light or sedentary nature. For example, light housework, office work)  2 - Symptomatic, <50% in bed during the day (Ambulatory and capable of all self care but unable to carry out any work activities. Up and about more than 50% of waking hours)  3 - Symptomatic, >50% in bed, but not bedbound (Capable of only limited self-care, confined to bed or chair 50% or more of waking hours)  4 - Bedbound (Completely disabled. Cannot carry on any self-care. Totally confined to bed or chair)  5 - Death   Santiago Glad MM, Creech RH, Tormey DC, et al. [phone]). "Toxicity and response criteria of the [facility]". Am. Evlyn Clines. Oncol. 5 (6): 649-55    LABORATORY DATA:  Lab Results  Component Value Date   WBC 6.6 03/22/2023   HGB 10.7 (L) 03/22/2023   HCT 32.8 (L) 03/22/2023   MCV 92.3 03/22/2023   PLT 365.0 03/22/2023   Lab Results  Component Value Date   NA 140 01/25/2023   K 4.1 01/25/2023   CL 102 01/25/2023   CO2 32 01/25/2023   Lab Results  Component Value Date   ALT 15 03/22/2023   AST 21 03/22/2023   ALKPHOS 68 03/22/2023   BILITOT 0.3 03/22/2023      RADIOGRAPHY: NM PET Image Restage (PS) Skull Base to Thigh (F-18 FDG)  Result Date: 04/09/2023 CLINICAL DATA:  Subsequent  treatment strategy for lung carcinoma. Chemotherapy ongoing. Concern for increasing RIGHT lower lobe nodule. EXAM: NUCLEAR MEDICINE PET SKULL BASE TO THIGH TECHNIQUE: 7.47 mCi F-18 FDG was injected intravenously. Full-ring PET imaging was performed from the skull base to thigh after the radiotracer. CT data was obtained and used for attenuation correction and anatomic localization. Fasting blood glucose: 110 mg/dl COMPARISON:  CT 96/01/5408 com PET-CT 10/04/2022 FINDINGS: Mediastinal blood pool activity: SUV max 2.0 Liver activity: SUV max NA NECK: No hypermetabolic lymph nodes in the neck. Incidental CT findings: None. CHEST: Rounded focus of intense metabolic activity in the RIGHT lower lobe measures 2.7 cm with SUV max equal 15.4. This corresponds to nodule of concern on comparison CT. Band of parenchymal consolidation in the more superior RIGHT lower lobe has mild metabolic activity favored post radiation change. Band parenchymal consolidation in the RIGHT upper lobe is also similar mild metabolic activity. No hypermetabolic mediastinal lymph nodes. Incidental CT findings: None. ABDOMEN/PELVIS: No abnormal hypermetabolic activity within the liver, pancreas, adrenal glands, or spleen. No hypermetabolic lymph nodes in the abdomen or pelvis. Incidental CT findings: None. SKELETON: No focal hypermetabolic activity to suggest skeletal metastasis. Incidental CT findings: None. IMPRESSION: 1. Persistent pulmonary nodule in the inferior RIGHT lower lobe with intense metabolic activity is concerning for lung cancer recurrence. 2. Consolidation in the superior aspect of the RIGHT lower lobe and RIGHT upper lobe is similar to prior and most consistent with post radiation change. 3. No hypermetabolic mediastinal lymph nodes. 4. No distant metastatic disease. Electronically Signed   By: Genevive Bi M.D.   On: 04/09/2023 10:22   DG Chest 2 View  Result Date: 03/29/2023 CLINICAL DATA:  Shortness of breath. EXAM: CHEST  - 2 VIEW COMPARISON:  Chest radiograph from 12/13/2022. FINDINGS: Redemonstration of chronic changes throughout bilateral lungs. There are underlying emphysematous changes/fibrosis. Redemonstration of irregular opacity  overlying the right mid lung zone. No acute consolidation or major lung collapse. Bilateral costophrenic angles are clear. Stable cardio-mediastinal silhouette. No acute osseous abnormalities. The soft tissues are within normal limits. There are surgical clips in the right upper quadrant, typical of a previous cholecystectomy. IMPRESSION: No active cardiopulmonary disease. Stable chronic lung parenchymal changes. Electronically Signed   By: Jules Schick M.D.   On: 03/29/2023 15:38   CT Angio Chest W/Cm &/Or Wo Cm  Result Date: 03/26/2023 CLINICAL DATA:  Worsening shortness of breath and chest discomfort 3 weeks. Elevated D-dimer. Lung carcinoma. Undergoing chemotherapy. * Tracking Code: BO * EXAM: CT ANGIOGRAPHY CHEST WITH CONTRAST TECHNIQUE: Multidetector CT imaging of the chest was performed using the standard protocol during bolus administration of intravenous contrast. Multiplanar CT image reconstructions and MIPs were obtained to evaluate the vascular anatomy. RADIATION DOSE REDUCTION: This exam was performed according to the departmental dose-optimization program which includes automated exposure control, adjustment of the mA and/or kV according to patient size and/or use of iterative reconstruction technique. CONTRAST:  75mL OMNIPAQUE IOHEXOL 350 MG/ML SOLN COMPARISON:  01/25/2023 FINDINGS: Cardiovascular: Satisfactory opacification of pulmonary arteries noted, and no pulmonary emboli identified. No evidence of thoracic aortic dissection or aneurysm. Aortic and coronary atherosclerotic calcification incidentally noted. Mediastinum/Nodes: No masses or pathologically enlarged lymph nodes identified. Lungs/Pleura: Centrilobular and paraseptal emphysema again noted. Chronic changes of  subpleural fibrosis and honeycombing again seen with bibasilar predominance. Areas of confluent opacity and central bronchiectasis are again seen in the right upper and lower lobes, without significant change. A poorly defined nodular opacity in the posterior right lower lobe on image 95/5 currently measures 1.9 x 1.8 cm, compared to 1.2 x 1.0 cm previously. No other new or enlarging sites of pulmonary opacity identified. No evidence of pleural effusion. Upper abdomen: No acute findings. Musculoskeletal: No suspicious bone lesions identified. Review of the MIP images confirms the above findings. IMPRESSION: No evidence of pulmonary embolism. Increased size of 1.9 cm nodular opacity in posterior right lower lobe, suspicious for carcinoma. No significant change in areas of right lung airspace disease and bronchiectasis, likely due to post radiation changes. Chronic pulmonary emphysema and fibrosis. Aortic Atherosclerosis (ICD10-I70.0) and Emphysema (ICD10-J43.9). Electronically Signed   By: Danae Orleans M.D.   On: 03/26/2023 16:10       IMPRESSION/PLAN: 1. Putative Stage IA3, cT1cN0M0, NSCLC of the RLL. Dr. Mitzi Hansen discusses the patient's course to date and the imaging since her therapy. While her prior site of disease appears to have been treated and stable, there is new disease more inferiorly and posteriorly in the RLL which appears to be a new primary cancer. Dr. Mitzi Hansen would offer stereotacic body radiotherapy to this site. We discussed the risks, benefits, short, and long term effects of radiotherapy, as well as the curative intent, and the patient is interested in proceeding. Dr. Mitzi Hansen discusses the delivery and logistics of radiotherapy and anticipates a course of 3-5 fractions of radiotherapy to the posterior tumor in the RLL. She will come tomorrow for simulation at 11am at which time she will sign written consent to proceed. She is aware of the risks of fibrosis at the site of treatment, and Dr. Mitzi Hansen is  hopeful to use breath hold technique to minimize the risk of scaring from radiotherapy. 2. Stage IIB, cT2aN1M0, NSCLC, Squamous Cell Carcinoma of the RLL.This continues to be followed in surveillance.   3. Interstitial lung disease with COPD/emphysema, O2 dependant. The patient will follow up with her pulmonologist. We  are hopeful this will not progress independently or secondarily in the site of treatment from radiation.   This encounter was provided by telemedicine platform MyChart.  The patient has provided two factor identification and has given verbal consent for this type of encounter and has been advised to only accept a meeting of this type in a secure network environment. The time spent during this encounter was 60 minutes including preparation, discussion, and coordination of the patient's care. The attendants for this meeting include Rober Minion, RN, Dr. Mitzi Hansen, Ronny Bacon  and Samara Deist Steven.  During the encounter,  Rober Minion, RN, Dr. Mitzi Hansen, and Ronny Bacon were located at [facility] Radiation Oncology Department.  Juanelle Imbert was located at home.     The above documentation reflects my direct findings during this shared patient visit. Please see the separate note by Dr. Mitzi Hansen on this date for the remainder of the patient's plan of care.    Osker Mason, [facility]   **Disclaimer: This note was dictated with voice recognition software. Similar sounding words can inadvertently be transcribed and this note may contain transcription errors which may not have been corrected upon publication of note.**

## 2023-04-11 NOTE — Progress Notes (Addendum)
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 A diagnosis of Interstitial lung disease (HCC) was also pertinent to this visit.  Patient identity verified x2.  Patient reports persistent SOB but is on 5 units of continuous oxygen. Patient is doing relatively well post issues w/ pneumonia and emphysema. No other issues conveyed at this time.   Meaningful use complete.  Vitals- Ht 5\' 6"  (1.676 m)   Wt 133 lb (60.3 kg)   BMI 21.47 kg/m    This concludes the interaction.  Ruel Favors, LPN

## 2023-04-12 ENCOUNTER — Ambulatory Visit
Admission: RE | Admit: 2023-04-12 | Discharge: 2023-04-12 | Disposition: A | Discharge status: Home / Self Care | Payer: Medicare Other | Source: Ambulatory Visit | Attending: Radiation Oncology | Admitting: Radiation Oncology

## 2023-04-12 ENCOUNTER — Other Ambulatory Visit: Payer: Self-pay

## 2023-04-12 DIAGNOSIS — C3431 Malignant neoplasm of lower lobe, right bronchus or lung: Secondary | ICD-10-CM | POA: Insufficient documentation

## 2023-04-13 ENCOUNTER — Encounter: Payer: Self-pay | Admitting: Internal Medicine

## 2023-04-13 ENCOUNTER — Telehealth (INDEPENDENT_AMBULATORY_CARE_PROVIDER_SITE_OTHER): Payer: Medicare Other | Admitting: Internal Medicine

## 2023-04-13 VITALS — Ht 66.0 in | Wt 133.0 lb

## 2023-04-13 DIAGNOSIS — J9611 Chronic respiratory failure with hypoxia: Secondary | ICD-10-CM

## 2023-04-13 DIAGNOSIS — C3490 Malignant neoplasm of unspecified part of unspecified bronchus or lung: Secondary | ICD-10-CM

## 2023-04-13 DIAGNOSIS — J84112 Idiopathic pulmonary fibrosis: Secondary | ICD-10-CM

## 2023-04-13 DIAGNOSIS — J439 Emphysema, unspecified: Secondary | ICD-10-CM

## 2023-04-13 NOTE — Addendum Note (Signed)
Encounter addended by: Dorothy Puffer, MD on: 04/13/2023 10:57 AM  Actions taken: Clinical Note Signed

## 2023-04-13 NOTE — Progress Notes (Signed)
OV 11/13/2022 transfer of care to the ILD center with Dr. Marchelle Gearing.  Subjective:  Patient ID: Diane [name], female , DOB: [birth year] , age 67 y.o. , MRN: [id number] , ADDRESS: [address] [place] [address] PCP Ignatius Specking, MD Patient Care Team: Ignatius Specking, MD as PCP - General (Internal Medicine) Jena Gauss Gerrit Friends, MD as Consulting Physician (Gastroenterology) Janalyn Harder, MD (Inactive) as Consulting Physician (Dermatology)  This Provider for this visit: Treatment Team:  Attending Provider: Kalman Shan, MD    11/13/2022 -   Chief Complaint  Patient presents with   New Patient (Initial Visit)    New pt from [facility] for ILD. Patient filled out packet and for Esbriet but never started the medication. CT is for April 2024. Pt is just getting over PNA. Still has a slight cough. Just finished prednisone and Levaquin.      HPI Diane [name] 67 y.o. -presents with her husband Diane [name].  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  She retire and then in the spring 2023 was diagnosed with right lower lobe non-small cell cancer stage II.  She is status post radiation and also chemotherapy.  Currently on observation treatment.  This is based on independent history from the husband and the patient and also review of the external records.  She is understandably quite frustrated with the onset of all these health issues given the fact she just retired.  Then approximately in September 2023 started noticing significant amount of dyspnea on exertion walking the dog and climbing stairs.  At this time desaturations with exercise to be noticed.  She has since been using oxygen and this helps her.  She is attended cardiac rehabilitation or pulmonary rehabilitation and this also helped her.  She was then given a diagnosis of pulmonary fibrosis/UIP.  Antifibrotic pirfenidone was started based on her history and also external  record review.  However due to some confusion of the paperwork this has not been started yet.  She is here for evaluation of her ILD.  Visualization of CT scan from 2015 shows presence of possible early ILD even back in 2015 with some honeycombing/emphysema at the base.  There was a craniocaudal gradient back pain.   Also of note she just went to a [place] cruise in mid January 2024.  When she came back on October 28, 2022.  Then on October 30, 2022 she had cough headaches no fever.  Her flu test and COVID was negative.  She is being treated for acute bronchitis with Levaquin and IM steroids and 6-day prednisone taper.  She will she was given a diagnosis of pneumonia actually.  She is almost back to baseline but still feels fatigued and still with some cough worse than her baseline. [place] [facility] Comprehensive ILD Questionnaire  Symptoms:  0 she believes she has had much rapid worsening in the last 6 months only.  Suspect this is after her radiation.  Current symptom scores are as below.   Past Medical History :  -She was told 20 years ago she had hiatal hernia and acid reflux but she says she has had no issues since then.  It is definitely not described in the current CT chest -She is awaiting a sleep study -She had seizures 12 years ago. -She has hypertension controlled on medication -Currently just finished antibiotic and steroid course-has had COVID-vaccine but never had COVID    ROS:  -Tired and does  have dry eyes -Has acid reflux for which she is taking omeprazole  FAMILY HISTORY of LUNG DISEASE:  *-Mother had asthma but there is no family history of lung disease  PERSONAL EXPOSURE HISTORY:   -She smoked between 1973 and 2022.  Smoked 1 pack/day and then quit.  In the 1980s and 1990s very briefly very rarely she did some cocaine.  She smoked some marijuana between 1976 and 2022 very minimal.  HOME  EXPOSURE and HOBBY DETAILS :  [place]   The home is 66 years old.  She is lived there for 19 years.  She recently got her down comforter 3 months ago but otherwise no organic antigen exposure in the house.  Although there is a leaky roof for the last 1 year in 2021 but there is no mold.  She is retired.  She works at [employer].  She works in [employer].  She did desk job.  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  INVESTIGATIONS: -As below    HRCT dec 2023 -personally visualized and independently agree  COMPARISON:  Chest CT 06/27/2022.   FINDINGS: Cardiovascular: Heart size is normal. There is no significant pericardial fluid, thickening or pericardial calcification. There is aortic atherosclerosis, as well as atherosclerosis of the great vessels of the mediastinum and the coronary arteries, including calcified atherosclerotic plaque in the left main, left anterior descending and right coronary arteries.   Mediastinum/Nodes: No pathologically enlarged mediastinal or left hilar lymph nodes. Soft tissue fullness in the right hilar region concerning for underlying lymphadenopathy, but poorly evaluated on today's noncontrast CT examination (best appreciated on axial image 60 of series 2). Esophagus is unremarkable in appearance. No axillary lymphadenopathy.   Lungs/Pleura: Treated lesion in the right lower lobe is now partially obscured by surrounding parenchymal changes, but is estimated to measure approximately 2.1 x 1.4 cm (axial image 114 of series 4). There has been a dramatic increase in surrounding areas of ground-glass attenuation, septal thickening, bronchiectasis and cystic changes, and regional architectural distortion in the surrounding lung parenchyma, presumably reflective of evolving postradiation changes. Several nodular areas of  architectural distortion are now noted in the right lung, most notably a macrolobulated nodule with spiculated margins in the right upper lobe (axial image 86 of series 4) measuring 1.9 x 1.3 cm, and a slightly ill-defined 1.9 x 1.1 cm nodular area of architectural distortion in the right lower lobe at the base (axial image 185 of series 4). Small right pleural effusion predominantly lying dependently. No left pleural effusion. In the remaining portions of the lungs are widespread areas of septal thickening, subpleural reticulation, traction bronchiectasis, peripheral bronchiolectasis and honeycombing, most evident in the mid to lower lung distribution. Inspiratory and expiratory imaging is unremarkable. There is also a background of mild diffuse bronchial wall thickening with moderate centrilobular and paraseptal emphysema.   Upper Abdomen: Aortic atherosclerosis.  Status post cholecystectomy.   Musculoskeletal: There are no aggressive appearing lytic or blastic lesions noted in the visualized portions of the skeleton.  IMPRESSION: 1. The appearance of the lungs is compatible with study. 2. Evolving postradiation changes in the right lung. Treated nodule in the superior segment of the right lower lobe appears grossly similar to the prior study, while there are new nodules in the right lung, most concerning of which is in the  right upper lobe (axial image 86 of series 4), currently measuring 1.9 x 1.3 cm. Close attention on follow-up studies is recommended. These new areas of nodularity could be treatment related or infectious or inflammatory in etiology, however, neoplasm is not excluded. 3. Mild diffuse bronchial wall thickening with moderate centrilobular and paraseptal emphysema; imaging findings suggestive of underlying COPD. 4. Aortic atherosclerosis, in addition to left main and 2 vessel coronary artery disease. Please note that although the presence of coronary artery  calcium documents the presence of coronary artery disease, the severity of this disease and any potential stenosis cannot be assessed on this non-gated CT examination. Assessment for potential risk factor modification, dietary therapy or pharmacologic therapy may be warranted, if clinically indicated.   Aortic Atherosclerosis (ICD10-I70.0) and Emphysema (ICD10-J43.9).     Electronically Signed   By: Trudie Reed M.D.   On: 09/27/2022 07:43   Telephone with [place] Dec 12/13/22 tient called the office 12/13/2022  to get her sleep study results.  While I was talking with the patient she was short of breath and shared that she had increased oxygen demands. She said this has been going on for several weeks since she returned from  a cruise she took with friends to celebrate finishing chemo and radiation . This was a [place] cruise in January 2024. She returned October 28, 2022. Then on October 30, 2022 she had cough headaches no fever. Her flu test and COVID was negative. She was treated for acute bronchitis and pneumonia  with Levaquin and IM steroids and 6-day prednisone taper.  She was feeling better but still fatigued and had a and still with some cough worse than her baseline  when she saw Dr. Marchelle Gearing  11/13/2022.  I ordered a CXR at [facility] (Closer to patient ).I also ordered a home tank with a higher liter flow capacity as her oxygen demand was so much higher.. CXR Results 2/28 confirmed >>  Diffuse bilateral interstitial pulmonary opacity, increased compared to prior examination, consistent with edema or infection. No focal airspace opacity. Unchanged post treatment appearance of the right midlung.I called the patient with the results of her scan and sent in prescriptions for Doxycycline 100 mg BID x 7 days and a prednisone taper Prednisone taper; 10 mg tablets: 4 tabs x 2 days, 3 tabs x 2 days, 2 tabs x 2 days 1 tab x 2 days then stop. She has a penicillin allergy, and history of QT  prolongation .  I  asked her to start Mucinex 1200 mg each morning with a glass of water and start using her flutter valve. She states her secretions are thick and hard to get up. I have asked her to use her albuterol nebs in the morning and evening while she is sick, and to wear her oxygen at bedtime at 2  liters. When I asked if she has had a fever, she said she did not think so, but when I asked if she had chills , she said she has had a lot of couch time covered in blankets, so endorses that she may have had fever. I asked Olegario Messier to check in with me Friday 12/15/2022 to ensure she was improving. We discussed that if she does not start turning around soon, we may need to consider IV antibiotics. We discussed if she gets worse , not better to call the office  or seek emergency care.  I reviewed her last echo from 11/15/2022, which shows EF of 60-65%, LV normal function,  Left ventricular diastolic parameters are consistent with Grade I diastolic  dysfunction. She denied any lower extremity edema. She does have a follow up CT Chest scheduled 01/25/2023 per oncology,I plan to  order one earlier if she does not improve.    12/20/2022 -office with Kandice Robinsons  Pt. Presents for short term follow up of multifocal pneumonia noted on CXR 12/13/2022. She was treated with Doxycycline and prednisone taper 12/13/2022 for her slow to resolve pneumonia . She states she is feeling much better. She states it is like night and day. She states she is back to her baseline. She is using 3 L [place] at rest and 4 L with exertion. Her oxygen demand is back to her baseline and she has her energy back. She does still have a cough, which is productive . She coughed in the office and she had some blood streaks in it. She states this is new, and she thinks it is from coughing. Secretions are mostly clear with some tan bits. She denies fever or chest pain. She has completed the Doxycycline and has her last dose of prednisone tomorrow. I will give her  an additional 7 days of 10 mg prednisone daily to cover her until she sees Dr. Marchelle Gearing next  week.  She started perfididone Monday 12/18/2022. I have told her to let us know if she has any stomach upset.  She is much more compliant with her oxygen use. She had her oxygen on today in the office. She is monitoring her oxygen levels. She is compliant with her Anoro daily. She is using her albuterol as needed. She has been using her Mucinex with flutter valve for secretion mobilization.   She does have headaches, which I think are from her cough. I have prescribed her cough medication with Codeine to see if that resolves the headaches.   She is still waiting in her CPAP machine and her 10 L home oxygen concentrator. My nurse will check with [employer] about the delay.      12/26/2022 -   Chief Complaint  Patient presents with   Follow-up    PFT F/up   r  HPI Latarcha Hrivnak 67 y.o. -returns for follow-up.  She presents with her husband.  She tells me that after she saw me she got the prednisone taper and felt transiently better but then when she had a phone call with Jairo Ben it was noticed that she was quite symptomatic.  She was then called in for an acute visit and given 7-10 days of daily prednisone along with an antibiotic.  This then has helped her come back to baseline.  She feels she is currently at baseline.  At the time it was noticed that she was desaturating easily and she needed significant amount of oxygen to correct.  Her home oxygen concentrator only go up to 5 L.  Therefore we did a oxygen titration test today and she actually needs 8-10 L to correct and walk our standard 3 laps in the office.  Nevertheless she feels closer to baseline.  She still has a lot of fatigue and shortness of breath and she wants to improve on this.  She is open and willing to attend pulmonary rehabilitation.  Apparently referral has been made by me and by nurse practitioner but she still has not heard.   I did indicate to her we will make another referral again.  She also told me that she is now touch base with Army Chaco the support  group chair and she is enjoying those conversations.  She had pulmonary function test that compared to a year ago shows significant decline at least 16% and the FVC decline in a year.  She just started Esbriet for this and as of yesterday has gone to 3 pills 3 times daily.  She is to have an echo and pulmonary artery pressures are reported as normal  Other than fatigue which she does not believe is due to pirfenidone she is actually tolerating the pirfenidone quite well.    02/06/2023 Pt. Presents for follow up 6 weeks after seeing Dr. Marchelle Gearing. She was started on Esbriet 10/31/2022.She initially was  Unable tolerate 3 tabs TID ( vomiting and constant  nausea). She dropped the dose in March to 2 tabs TID, and then tried to increase to 3 tabs daily again and was nauseated with weight loss of 5 pounds in 1 week. She dropped her dose back to 2 tablets TID  on  02/04/2023  and she is able to tolerate this dose. She is tolerating the lower dose well, already feels better after 2 days on the lower dose. She has been able to tolerate 2 tablets three times daily.We will hold at this dose for now until she sees Dr. Marchelle Gearing again in 6 weeks. LFT's drawn at the cancer center 01/25/2023 show  AST of 16, ALT if 11, Alk Phos of 64 and total bili of 0.3, so all WNL.She states she is no longer drinking wine , which is most likely helping maintain normal hepatic labs. Her CBC did show some anemia. HGB of 11.3 with a WBC of 7.5. This is about a  1.3 gram drop over previous HGB which was 12.6 four months ago. She has been started on Iron by Dr. Arbutus Ped, and she has follow up labs placed by Dr.Mohamed  Tyan looks great today. No fever, no change in secretions, no change in shortness of breath. She feels she is at her baseline. Current oxygen use is 4L [place] at rest and 6 L with exertion. She is  wearing her oxygen at all times, and she maintains saturations > 92%. She is not using her CPAP with everything she has going on but she is wearing her oxygen at bedtime. She does have a POC that her husband  purchased without insurance , and she uses this when driving in the car, as it meets her at rest oxygen needs.  She is doing well at pulmonary rehab. She comes to the [facility] rehab office and she feels she is getting a tremendous benefit from this. She is also meeting other ILD patient's and gets comfort and support from them.   There were some changes on her surveillance CT Chest  01/27/2023. Her previous CT scan in December 2023 showed finding compatible with a combination of emphysema and interstitial lung disease which has been progressive.  She also had evolving postradiation changes in the right lung and the treated nodule in the superior segment of the right lower lobe appeared grossly similar but there were new nodules within the right lung most concerning of which is in the right upper lobe and currently measures 1.9 x 1.3 cm requiring close monitoring.  She had a PET scan performed in early January 2024 and it showed no concerning findings for disease progression,  in the area of concern showed moderate metabolic activity that are nonspecific and may be related to infectious or inflammatory process but neoplasm could not be completely excluded. She has follow  up CT ordered for 3 months which will be July 2024. She knows to call immediately for any concerning symptoms or changes in her breathing.   We are doing a therapeutic trial with Breztri. She is currently on Anoro which she states does not seem to help with her breathing. We discussed that there is a steroid component to the [place] which can increase risk of pneumonia. She knows to stop using inhaler and call to be seen if she has any changes in her breathing status. I have also prescribed an albuterol inhaler as she does have episodes  of what sounds like upper airway wheezing.   We need to continue to monitor for weight loss. On the Esbriet 3 tabs TID she lost a significant amount of weight. We will re-evaluate her weight upon follow up with Dr. Marchelle Gearing in 6 weeks on the 2 tablets TID.  She will need repeat LFT's in 4-6 weeks , She sees Dr. Elly Modena 03/22/2023.      Test Results: CT Chest with contrast 01/27/2023 Slight increase in focal opacity involving the superior segment right lower lobe with increasing pleural thickening and loculated small pleural fluid.   The previously seen hypermetabolic nodules in the right upper lobe and lower lobe separately are again noted today in our appear relatively similar when adjusting for technique.   Persistent soft tissue fullness of the right lung hilum as well as some prominent nodes in the mediastinum and left hilum.   Underlying advanced emphysematous lung changes.   Aortic Atherosclerosis (ICD10-I70.0) and Emphysema (ICD10-J43.9).   OV 03/22/2023  Subjective:  Patient ID: Diane [name], female , DOB: Aug 30, 1956 , age 50 y.o. , MRN: [id number] , ADDRESS: [address] [place] [address] PCP Ignatius Specking, MD Patient Care Team: Ignatius Specking, MD as PCP - General (Internal Medicine) Jena Gauss, Gerrit Friends, MD as Consulting Physician (Gastroenterology) Janalyn Harder, MD (Inactive) as Consulting Physician (Dermatology)  This Provider for this visit: Treatment Team:  Attending Provider: Kalman Shan, MD  #IPF/UIP pattern #Associated pulmonary emphysema #Combined pulmonary fibrosis emphysema  -Chronic green sputum #Lung cancer #DNR , ECOG 3-4, Class 4 dyspnea  03/22/2023 -   Chief Complaint  Patient presents with   Follow-up    PFT:03/22/23 O2 4l     HPI Mahayla Mooney 67 y.o. -returns for follow-up.  Since her last visit her symptoms have deteriorated.  Pulmonary function test also shows a steep decline in both FVC and particularly with  DLCO.  She tells me that ever since January 2024 she is having a steady decline but more so in the last 1 month.  And send the last 1 month her shower and getting dressed time has gotten worse.  She used to be able to get ready within 30 minutes now she is requiring 1 hour.  However at pulmonary rehabilitation the max oxygen she is required has been 8-10 L [4 L at rest] but 2 days ago abruptly she required 15 L and I was alerted about it.  She is denying any hemoptysis or fever or chills.  She has chronic green sputum but this is not change.  There is no worsening of cough there is no edema.  She is not on any anticoagulation.  In addition she is also noticing abrupt spasms of breathing for the last 1 month is almost a stridorous noise.  She was sucking air and it is loud it happens spontaneously.  Happens around 20 times a day.  Her husband comes  rushing from the next room.  But she states her pulse ox remained stable.  Correlating with all this is the pulmonary function test to significantly decline.  Also CT scan of the chest in April 2024 that I personally visualized is significantly worse.  Dr. Arbutus Ped is doing another CT scan in July 2024.  Today she went to her primary care physician at [facility] but I do not have the results she showed me lipid results but she believes the liver function test was normal.  She says that she has been given a life expectancy of less than 1 year she is upset about this.  But she says she is also realistic.  She says she has advanced directives she is working on it.  She is agreed to be a DNR.  Symptom wise significant cough is there but dyspnea is even worse.  She is willing to get Hycodan to help both symptoms  Overall with her quality of life and decline while she is looking for the reversible and looking to remain as healthy and as alive as possible she is also willing to be seen by palliative care at home.  We did a goals of care discussion about all this.  Filled out [employer] emergency backup electricity form.  Also filled out handicap placard form.    OV 04/13/2023  Subjective:  Patient ID: Diane [name], female , DOB: 06-25-1956 , age 2 y.o. , MRN: [id number] , ADDRESS: [address] [place] [address] PCP Ignatius Specking, MD Patient Care Team: Ignatius Specking, MD as PCP - General (Internal Medicine) Jena Gauss Gerrit Friends, MD as Consulting Physician (Gastroenterology) Janalyn Harder, MD (Inactive) as Consulting Physician (Dermatology)  This Provider for this visit: Treatment Team:  Attending Provider: Kalman Shan, MD  Type of visit: Video Virtual Visit Identification of patient Asayo Buckalew with 1955-11-23 and MRN [id number] - 2 person identifier Risks: Risks, benefits, limitations of telephone visit explained. Patient understood and verbalized agreement to proceed Anyone else on call: husband Patient location: her home This provider location: [address], [address]; [place]; [place] [address].  Pulmonary Office. [phone]    04/13/2023 -   Chief Complaint  Patient presents with   Follow-up    F/up discuss intervenous steroids and discuss some other medical issues.     HPI Adelina Wampole 67 y.o. - preents with husband on video visit . Has been betting worse. . IN interim, ABG without hypercapnia. CTA ruled out PE but showed worsning RLL nodule. Has known post XRT consolidation in RLL. No ILD flare up. Saw onc and rad onc. Notes reviewed. Advised SBRT mid uly x 5 sessions. She is worriued about ILD flare with XRT. But she is on pirfenidone. Wants me clearance pripr to SBRT and also if neeed to do high dose steroids    SYMPTOM SCALE - ILD 11/13/2022 03/22/2023 4L [place] rest, 10-15L [place] exertiona  Current weight    O2 use Exertional walking the dog.   Shortness of Breath 0 -> 5 scale with 5 being worst (score 6 If unable to do)   At rest 0 0  Simple tasks - showers, clothes change, eating, shaving 3 4   Household (dishes, doing bed, laundry) 3 4  Shopping 2 4  Walking level at own pace 4 4  Walking up Stairs 4 5  Total (30-36) Dyspnea Score 16 21      Non-dyspnea symptoms (0-> 5 scale) 11/13/2022 03/22/2023   How bad is  your cough? 2 -cough is better after quitting smoking 2  How bad is your fatigue 2 4  How bad is nausea 0 2  How bad is vomiting?  0 0  How bad is diarrhea? 0 0  How bad is anxiety? 3 2  How bad is depression 1 2  Any chronic pain - if so where and how bad 0 x    Simple office walk 185 feet x  3 laps goal with forehead probe 12/26/2022  12/26/2022   O2 used RA   Number laps completed 1/2 lap of 1 ap   Comments about pace normal   Resting Pulse Ox/HR 94% and 90/min   Final Pulse Ox/HR 87% and 90/min   Desaturated </= 88% yes   Desaturated <= 3% points yes   Got Tachycardic >/= 90/min yes   Symptoms at end of test dyspnea   Miscellaneous comments Waked on 5L [place] - 96% sitting -> still deats Needed 8-10L to correct and walk all 3 laps   CTA 6/10;/24  Narrative & Impression  CLINICAL DATA:  Worsening shortness of breath and chest discomfort 3 weeks. Elevated D-dimer. Lung carcinoma. Undergoing chemotherapy. * Tracking Code: BO *   EXAM: CT ANGIOGRAPHY CHEST WITH CONTRAST   TECHNIQUE: Multidetector CT imaging of the chest was performed using the standard protocol during bolus administration of intravenous contrast. Multiplanar CT image reconstructions and MIPs were obtained to evaluate the vascular anatomy.   RADIATION DOSE REDUCTION: This exam was performed according to the departmental dose-optimization program which includes automated exposure control, adjustment of the mA and/or kV according to patient size and/or use of iterative reconstruction technique.   CONTRAST:  75mL OMNIPAQUE IOHEXOL 350 MG/ML SOLN   COMPARISON:  01/25/2023   FINDINGS: Cardiovascular: Satisfactory opacification of pulmonary arteries noted, and no pulmonary emboli  identified. No evidence of thoracic aortic dissection or aneurysm. Aortic and coronary atherosclerotic calcification incidentally noted.   Mediastinum/Nodes: No masses or pathologically enlarged lymph nodes identified.   Lungs/Pleura: Centrilobular and paraseptal emphysema again noted. Chronic changes of subpleural fibrosis and honeycombing again seen with bibasilar predominance. Areas of confluent opacity and central bronchiectasis are again seen in the right upper and lower lobes, without significant change. A poorly defined nodular opacity in the posterior right lower lobe on image 95/5 currently measures 1.9 x 1.8 cm, compared to 1.2 x 1.0 cm previously. No other new or enlarging sites of pulmonary opacity identified. No evidence of pleural effusion.   Upper abdomen: No acute findings.   Musculoskeletal: No suspicious bone lesions identified.   Review of the MIP images confirms the above findings.   IMPRESSION: No evidence of pulmonary embolism.   Increased size of 1.9 cm nodular opacity in posterior right lower lobe, suspicious for carcinoma.   No significant change in areas of right lung airspace disease and bronchiectasis, likely due to post radiation changes.   Chronic pulmonary emphysema and fibrosis.   Aortic Atherosclerosis (ICD10-I70.0) and Emphysema (ICD10-J43.9).     Electronically Signed   By: Danae Orleans M.D.   On: 03/26/2023 16:10      NM PET scan 04/05/23  Narrative & Impression  CLINICAL DATA:  Subsequent treatment strategy for lung carcinoma. Chemotherapy ongoing. Concern for increasing RIGHT lower lobe nodule.   EXAM: NUCLEAR MEDICINE PET SKULL BASE TO THIGH   TECHNIQUE: 7.47 mCi F-18 FDG was injected intravenously. Full-ring PET imaging was performed from the skull base to thigh after the radiotracer. CT data was obtained and used  for attenuation correction and anatomic localization.   Fasting blood glucose: 110 mg/dl   COMPARISON:   CT 16/07/9603 com PET-CT 10/04/2022   FINDINGS: Mediastinal blood pool activity: SUV max 2.0   Liver activity: SUV max NA   NECK: No hypermetabolic lymph nodes in the neck.   Incidental CT findings: None.   CHEST: Rounded focus of intense metabolic activity in the RIGHT lower lobe measures 2.7 cm with SUV max equal 15.4. This corresponds to nodule of concern on comparison CT.   Band of parenchymal consolidation in the more superior RIGHT lower lobe has mild metabolic activity favored post radiation change. Band parenchymal consolidation in the RIGHT upper lobe is also similar mild metabolic activity.   No hypermetabolic mediastinal lymph nodes.   Incidental CT findings: None.   ABDOMEN/PELVIS: No abnormal hypermetabolic activity within the liver, pancreas, adrenal glands, or spleen. No hypermetabolic lymph nodes in the abdomen or pelvis.   Incidental CT findings: None.   SKELETON: No focal hypermetabolic activity to suggest skeletal metastasis.   Incidental CT findings: None.   IMPRESSION: 1. Persistent pulmonary nodule in the inferior RIGHT lower lobe with intense metabolic activity is concerning for lung cancer recurrence. 2. Consolidation in the superior aspect of the RIGHT lower lobe and RIGHT upper lobe is similar to prior and most consistent with post radiation change. 3. No hypermetabolic mediastinal lymph nodes. 4. No distant metastatic disease.     Electronically Signed   By: Genevive Bi M.D.   On: 04/09/2023 10:22     PFT     Latest Ref Rng & Units 03/21/2023    2:53 PM 12/26/2022    1:53 PM 01/04/2022    9:51 AM  PFT Results  FVC-Pre L 1.85  2.10  2.54   FVC-Predicted Pre % 54  64  76   FVC-Post L  2.11  2.69   FVC-Predicted Post %  64  81   Pre FEV1/FVC % % 79  83  73   Post FEV1/FCV % %  81  71   FEV1-Pre L 1.47  1.74  1.84   FEV1-Predicted Pre % 56  69  72   FEV1-Post L  1.70  1.91   DLCO uncorrected ml/min/mmHg 2.82  6.99  9.15    DLCO UNC% % 13  34  44   DLCO corrected ml/min/mmHg 3.04  6.99  9.15   DLCO COR %Predicted % 14  34  44   DLVA Predicted % 45  55  55   TLC L  3.68  3.94   TLC % Predicted %  70  75   RV % Predicted %  74  62        has a past medical history of Anxiety, Arthritis, C. difficile diarrhea (2015), COPD (chronic obstructive pulmonary disease) (HCC), Depression, Eczema, H pylori ulcer, Headache(784.0), Hypercholesterolemia, Hypertension, Interstitial lung disease (HCC), Kidney stones, Pneumonia, Seizures (HCC), and Shingles.   reports that she quit smoking about 18 months ago. Her smoking use included cigarettes. She has a 15.00 pack-year smoking history. She has never used smokeless tobacco.  Past Surgical History:  Procedure Laterality Date   ABDOMINAL HYSTERECTOMY     ADENOIDECTOMY     APPENDECTOMY     BRONCHIAL BIOPSY  12/20/2021   Procedure: BRONCHIAL BIOPSIES;  Surgeon: Josephine Igo, DO;  Location: [facility];  Service: Pulmonary;;   BRONCHIAL BRUSHINGS  12/20/2021   Procedure: BRONCHIAL BRUSHINGS;  Surgeon: Josephine Igo, DO;  Location: [facility];  Service: Pulmonary;;   BRONCHIAL NEEDLE ASPIRATION BIOPSY  12/20/2021   Procedure: BRONCHIAL NEEDLE ASPIRATION BIOPSIES;  Surgeon: Josephine Igo, DO;  Location: [facility];  Service: Pulmonary;;   CESAREAN SECTION     CHOLECYSTECTOMY     COLONOSCOPY  06/10/2012   RMR: Colonic polyps -removed as described above.    FINE NEEDLE ASPIRATION  12/20/2021   Procedure: FINE NEEDLE ASPIRATION (FNA) LINEAR;  Surgeon: Josephine Igo, DO;  Location: [facility];  Service: Pulmonary;;   KNEE ARTHROSCOPY  1973   right knee,  torn cart   LUMBAR DISC SURGERY  05/12/2015   L4 L5   LUMBAR LAMINECTOMY/DECOMPRESSION MICRODISCECTOMY Left 05/12/2015   Procedure: Left L4-5 Microdiscectomy;  Surgeon: Eldred Manges, MD;  Location: [facility];  Service: Orthopedics;  Laterality: Left;   TONSILLECTOMY     VIDEO BRONCHOSCOPY WITH ENDOBRONCHIAL  ULTRASOUND Bilateral 12/20/2021   Procedure: VIDEO BRONCHOSCOPY WITH ENDOBRONCHIAL ULTRASOUND;  Surgeon: Josephine Igo, DO;  Location: [facility];  Service: Pulmonary;  Laterality: Bilateral;   VIDEO BRONCHOSCOPY WITH RADIAL ENDOBRONCHIAL ULTRASOUND  12/20/2021   Procedure: RADIAL ENDOBRONCHIAL ULTRASOUND;  Surgeon: Josephine Igo, DO;  Location: [facility];  Service: Pulmonary;;    Allergies  Allergen Reactions   Alpha-Gal Other (See Comments)    Other reaction(s): Abdominal Pain   Other Other (See Comments)   Penicillins     Unknown type of reaction    Immunization History  Administered Date(s) Administered   Fluad Quad(high Dose 65+) 08/08/2022   Influenza, Quadrivalent, Recombinant, Inj, Pf 07/24/2019   Influenza,inj,Quad PF,6+ Mos 07/15/2016, 08/03/2017, 08/29/2018   Influenza-Unspecified 07/16/2021   Moderna Sars-Covid-2 Vaccination 12/25/2019, 01/24/2020   PNEUMOCOCCAL CONJUGATE-20 08/08/2022   Pneumococcal Polysaccharide-23 09/29/2013   RSV,unspecified 09/14/2022    Family History  Problem Relation Age of Onset   Arthritis Mother    Asthma Mother    Depression Mother    Hyperlipidemia Mother    Varicose Veins Mother    Arthritis Father    Heart disease Father    Hyperlipidemia Father    Vision loss Father    Cancer Sister    COPD Sister    Early death Sister    Cancer Brother    Alcohol abuse Maternal Grandfather    Colon cancer Neg Hx      Current Outpatient Medications:    albuterol (PROVENTIL) (5 MG/ML) 0.5% nebulizer solution, Take 2.5 mg by nebulization every 6 (six) hours as needed for wheezing or shortness of breath., Disp: , Rfl:    albuterol (VENTOLIN HFA) 108 (90 Base) MCG/ACT inhaler, Inhale 2 puffs into the lungs every 6 (six) hours as needed for wheezing or shortness of breath., Disp: 8 g, Rfl: 2   ALPRAZolam (XANAX) 0.5 MG tablet, Take 0.5 mg by mouth daily as needed., Disp: , Rfl:    cetirizine (ZYRTEC) 10 MG tablet, Take 10 mg by mouth  at bedtime., Disp: , Rfl:    EPINEPHrine (AUVI-Q) 0.3 mg/0.3 mL IJ SOAJ injection, Inject 0.3 mLs (0.3 mg total) into the muscle as needed for anaphylaxis., Disp: 2 each, Rfl: 1   fluticasone (FLONASE) 50 MCG/ACT nasal spray, Place 2 sprays into both nostrils daily. (Patient taking differently: Place 2 sprays into both nostrils daily as needed for allergies.), Disp: 16 g, Rfl: 5   HYDROcodone bit-homatropine (HYCODAN) 5-1.5 MG/5ML syrup, Take 5 mLs by mouth every 6 (six) hours as needed for cough., Disp: 240 mL, Rfl: 0   omeprazole (PRILOSEC) 20 MG capsule, Take 1 capsule (  20 mg total) by mouth 2 (two) times daily. (Needs to be seen before next refill), Disp: 60 capsule, Rfl: 0   OVER THE COUNTER MEDICATION, Take 1 capsule by mouth daily. Probiotic, Disp: , Rfl:    Pirfenidone (ESBRIET) 267 MG TABS, Take 3 tablets (801 mg total) by mouth 3 (three) times daily with meals. (Account: [email or web address]), Disp: 270 tablet, Rfl: 4   rosuvastatin (CRESTOR) 10 MG tablet, Take 10 mg by mouth at bedtime., Disp: , Rfl:    umeclidinium-vilanterol (ANORO ELLIPTA) 62.5-25 MCG/ACT AEPB, Inhale 1 puff into the lungs daily., Disp: 60 each, Rfl: 3   Budeson-Glycopyrrol-Formoterol (BREZTRI AEROSPHERE) 160-9-4.8 MCG/ACT AERO, Inhale 2 puffs into the lungs in the morning and at bedtime. (Patient not taking: Reported on 04/13/2023), Disp: 2 each, Rfl: 0      Objective:   Vitals:   04/13/23 1636  Weight: 133 lb (60.3 kg)  Height: 5\' 6"  (1.676 m)    Estimated body mass index is 21.47 kg/m as calculated from the following:   Height as of this encounter: 5\' 6"  (1.676 m).   Weight as of this encounter: 133 lb (60.3 kg).  @WEIGHTCHANGE @  [employer]   04/13/23 1636  Weight: 133 lb (60.3 kg)     Physical Exam   General: No distress. On o2.  O2 at rest: yes  Neuro: Alert and Oriented x 3. GCS 15. Speech normal Psych: Pleasant      Assessment:       ICD-10-CM   1. IPF (idiopathic  pulmonary fibrosis) (HCC)  J84.112     2. Malignant neoplasm of unspecified part of unspecified bronchus or lung (HCC)  C34.90     3. Chronic respiratory failure with hypoxia (HCC)  J96.11     4. Pulmonary emphysema, unspecified emphysema type (HCC)  J43.9      Supported getting XRT. Acknowleded the XRT can flare up IPF but she is somewhat proteced by esbriet. Not much in literature to rcommend one way or other esp when dealing with terminal illness /severe illness such has IPF or advanced lung cancer. Advised steroid if XRT is making her o2 needs go up. Will see her back 3 weeks or so . Till then  supporting etting XRT    Plan:     Patient Instructions     ICD-10-CM   1. IPF (idiopathic pulmonary fibrosis) (HCC)  J84.112     2. Pulmonary emphysema, unspecified emphysema type (HCC)  J43.9     3. History of lung cancer  Z85.118     4. Chronic respiratory failure with hypoxia (HCC)  J96.11     5. Goals of care, counseling/discussion  Z71.89     6. DNR (do not resuscitate)  Z66     7. Chronic cough  R05.3     8. Dyspnea, unspecified type  R06.00     9. Advance directive discussed with patient  Z71.89        IPF (idiopathic pulmonary fibrosis) (HCC) Pulmonary emphysema, unspecified emphysema type (HCC) History of lung cancer- stgage 2 b with recurrence right lower lobe June 2024 Chronic respiratory failure with hypoxia (HCC)  Goals of care, counseling/discussion Advance directive DNR (do not resuscitate) Chronic cough Dyspnea, unspecified type   -Significant decline decline in symptoms and pulmonary function test since most recent evaluation. -CT scan chest April 2024 also with significant worsening. -Likely were dealing with progressive disease despite optimal treatment which makes the prognostic outlook bad - overall the decline in lung function and  increased oxygen need suggest poor prognosis and reduced life expectancy -Arterial blood gas without any covered  accept retention ruling out any benefit for BiPAP at night. -Dual diagnosis of lung cancer recurrence in the right lower lobe and you are a candidate for radiation.  Plan - Support getting local radiation for the lung cancer recurrence June 2024  -Discussed benefits and risks and limitations of radiation and overall took a shared decision making to recommend radiation with the risk protection coming from pirfenidone  -Continue oxygen 4 L at rest [likely need 4 L nasal cannula at night as well] and use 10-15 L with exertion  -Continue Hycodan 5 mL twice daily for palliative relief of both shortness of breath and cough  -Continue home palliative care if they are already visiting with you  -Glad you have advanced directives done and support DNR  Hold off  high-dose IV steroids for now; can consider in future based on XRT outcome  -Continue pulmonary rehabilitation    Plan -4-5 weeks with Dr. Marchelle Gearing video visit Evaristo Bury be with Jairo Ben as well]  ( Level 05 visit E&M 2024: Estb >= 40 min   in  visit type: on-site physical face to visit  in total care time and counseling or/and coordination of care by this undersigned MD - Dr Kalman Shan. This includes one or more of the following on this same day 04/13/2023: pre-charting, chart review, note writing, documentation discussion of test results, diagnostic or treatment recommendations, prognosis, risks and benefits of management options, instructions, education, compliance or risk-factor reduction. It excludes time spent by the CMA or office staff in the care of the patient. Actual time 40 min)   SIGNATURE    Dr. Kalman Shan, M.D., F.C.C.P,  Pulmonary and Critical Care Medicine Staff Physician, [facility] Director - Interstitial Lung Disease  Program  Pulmonary Fibrosis [facility] at [facility] [place], [place], [address]  Pager: [phone], If no answer or between  15:00h - 7:00h: call  [phone] Telephone: [phone]  11:11 PM 04/13/2023

## 2023-04-13 NOTE — Patient Instructions (Addendum)
ICD-10-CM   1. IPF (idiopathic pulmonary fibrosis) (HCC)  J84.112     2. Pulmonary emphysema, unspecified emphysema type (HCC)  J43.9     3. History of lung cancer  Z85.118     4. Chronic respiratory failure with hypoxia (HCC)  J96.11     5. Goals of care, counseling/discussion  Z71.89     6. DNR (do not resuscitate)  Z66     7. Chronic cough  R05.3     8. Dyspnea, unspecified type  R06.00     9. Advance directive discussed with patient  Z71.89        IPF (idiopathic pulmonary fibrosis) (HCC) Pulmonary emphysema, unspecified emphysema type (HCC) History of lung cancer- stgage 2 b with recurrence right lower lobe June 2024 Chronic respiratory failure with hypoxia (HCC)  Goals of care, counseling/discussion Advance directive DNR (do not resuscitate) Chronic cough Dyspnea, unspecified type   -Significant decline decline in symptoms and pulmonary function test since most recent evaluation. -CT scan chest April 2024 also with significant worsening. -Likely were dealing with progressive disease despite optimal treatment which makes the prognostic outlook bad - overall the decline in lung function and increased oxygen need suggest poor prognosis and reduced life expectancy -Arterial blood gas without any covered accept retention ruling out any benefit for BiPAP at night. -Dual diagnosis of lung cancer recurrence in the right lower lobe and you are a candidate for radiation.  Plan - Support getting local radiation for the lung cancer recurrence June 2024  -Discussed benefits and risks and limitations of radiation and overall took a shared decision making to recommend radiation with the risk protection coming from pirfenidone  -Continue oxygen 4 L at rest [likely need 4 L nasal cannula at night as well] and use 10-15 L with exertion  -Continue Hycodan 5 mL twice daily for palliative relief of both shortness of breath and cough  -Continue home palliative care if they are  already visiting with you  -Glad you have advanced directives done and support DNR  Hold off  high-dose IV steroids for now; can consider in future based on XRT outcome  -Continue pulmonary rehabilitation    Plan -4-5 weeks with Dr. Marchelle Gearing video visit Evaristo Bury be with Jairo Ben as well]

## 2023-04-17 ENCOUNTER — Other Ambulatory Visit: Payer: Self-pay

## 2023-04-17 ENCOUNTER — Encounter: Payer: Self-pay | Admitting: Internal Medicine

## 2023-04-17 ENCOUNTER — Other Ambulatory Visit: Payer: Self-pay | Admitting: Internal Medicine

## 2023-04-17 DIAGNOSIS — J439 Emphysema, unspecified: Secondary | ICD-10-CM

## 2023-04-17 DIAGNOSIS — J84112 Idiopathic pulmonary fibrosis: Secondary | ICD-10-CM

## 2023-04-17 DIAGNOSIS — J9611 Chronic respiratory failure with hypoxia: Secondary | ICD-10-CM

## 2023-04-17 NOTE — Telephone Encounter (Signed)
Yes pls give her the order she needs. Also sent in hycodan

## 2023-04-20 ENCOUNTER — Telehealth: Payer: Medicare Other | Admitting: Internal Medicine

## 2023-04-20 ENCOUNTER — Ambulatory Visit (HOSPITAL_COMMUNITY): Payer: Medicare Other

## 2023-04-20 ENCOUNTER — Other Ambulatory Visit: Payer: Medicare Other

## 2023-04-26 ENCOUNTER — Other Ambulatory Visit: Payer: Medicare Other

## 2023-04-28 DIAGNOSIS — C3431 Malignant neoplasm of lower lobe, right bronchus or lung: Secondary | ICD-10-CM | POA: Diagnosis present

## 2023-05-01 ENCOUNTER — Other Ambulatory Visit: Payer: Self-pay

## 2023-05-01 ENCOUNTER — Ambulatory Visit
Admission: RE | Admit: 2023-05-01 | Discharge: 2023-05-01 | Disposition: A | Discharge status: Home / Self Care | Payer: Medicare Other | Source: Ambulatory Visit | Attending: Radiation Oncology | Admitting: Radiation Oncology

## 2023-05-01 ENCOUNTER — Ambulatory Visit: Payer: Medicare Other | Admitting: Internal Medicine

## 2023-05-01 DIAGNOSIS — C3431 Malignant neoplasm of lower lobe, right bronchus or lung: Secondary | ICD-10-CM | POA: Diagnosis not present

## 2023-05-01 LAB — RAD ONC ARIA SESSION SUMMARY
Course Elapsed Days: 0
Plan Fractions Treated to Date: 1
Plan Prescribed Dose Per Fraction: 18 Gy
Plan Total Fractions Prescribed: 3
Plan Total Prescribed Dose: 54 Gy
Reference Point Dosage Given to Date: 18 Gy
Reference Point Session Dosage Given: 18 Gy
Session Number: 1

## 2023-05-02 ENCOUNTER — Ambulatory Visit: Payer: Medicare Other | Admitting: Radiation Oncology

## 2023-05-03 ENCOUNTER — Other Ambulatory Visit: Payer: Self-pay

## 2023-05-03 ENCOUNTER — Ambulatory Visit
Admission: RE | Admit: 2023-05-03 | Discharge: 2023-05-03 | Disposition: A | Discharge status: Home / Self Care | Payer: Medicare Other | Source: Ambulatory Visit | Attending: Radiation Oncology | Admitting: Radiation Oncology

## 2023-05-03 DIAGNOSIS — C3431 Malignant neoplasm of lower lobe, right bronchus or lung: Secondary | ICD-10-CM | POA: Diagnosis not present

## 2023-05-03 LAB — RAD ONC ARIA SESSION SUMMARY
Course Elapsed Days: 2
Plan Fractions Treated to Date: 2
Plan Prescribed Dose Per Fraction: 18 Gy
Plan Total Fractions Prescribed: 3
Plan Total Prescribed Dose: 54 Gy
Reference Point Dosage Given to Date: 36 Gy
Reference Point Session Dosage Given: 18 Gy
Session Number: 2

## 2023-05-04 ENCOUNTER — Ambulatory Visit: Payer: Medicare Other | Admitting: Radiation Oncology

## 2023-05-07 ENCOUNTER — Ambulatory Visit
Admission: RE | Admit: 2023-05-07 | Discharge: 2023-05-07 | Disposition: A | Discharge status: Home / Self Care | Payer: Medicare Other | Source: Ambulatory Visit | Attending: Radiation Oncology | Admitting: Radiation Oncology

## 2023-05-07 ENCOUNTER — Other Ambulatory Visit: Payer: Self-pay

## 2023-05-07 DIAGNOSIS — C3431 Malignant neoplasm of lower lobe, right bronchus or lung: Secondary | ICD-10-CM | POA: Diagnosis not present

## 2023-05-07 LAB — RAD ONC ARIA SESSION SUMMARY
Course Elapsed Days: 6
Plan Fractions Treated to Date: 3
Plan Prescribed Dose Per Fraction: 18 Gy
Plan Total Fractions Prescribed: 3
Plan Total Prescribed Dose: 54 Gy
Reference Point Dosage Given to Date: 54 Gy
Reference Point Session Dosage Given: 18 Gy
Session Number: 3

## 2023-05-08 NOTE — Radiation Completion Notes (Addendum)
  Radiation Oncology         [phone]) [phone] ________________________________  Name: Diane [name] MRN: [id number]  Date of Service: 05/07/2023  DOB: Jun 20, 1956  End of Treatment Note   Diagnosis:  Putative Stage IA3, cT1cN0M0, NSCLC of the RLL with history of Stage IIB, cT2aN1M0, NSCLC, Squamous Cell Carcinoma of the RLL.Interstitial lung disease with COPD/emphysema, O2 dependant.  Intent: Curative     ==========DELIVERED PLANS==========  First Treatment Date: 2023-05-01 - Last Treatment Date: 2023-05-07   Plan Name: Lung_Rt_SBRT Site: Lung, Right Technique: SBRT/SRT-IMRT Mode: Photon Dose Per Fraction: 18 Gy Prescribed Dose (Delivered / Prescribed): 54 Gy / 54 Gy Prescribed Fxs (Delivered / Prescribed): 3 / 3     ==========ON TREATMENT VISIT DATES========== 2023-05-01, 2023-05-03, 2023-05-07, 2023-05-07    See weekly On Treatment Notes in Epic for details. The patient tolerated radiation. She developed fatigue during therapy.   The patient will receive a call in about one month from the radiation oncology department. She will continue follow up with Dr. Arbutus Ped as well.      Osker Mason, PAC

## 2023-05-09 ENCOUNTER — Ambulatory Visit: Payer: Medicare Other | Admitting: Radiation Oncology

## 2023-05-11 ENCOUNTER — Encounter: Payer: Self-pay | Admitting: Internal Medicine

## 2023-05-11 ENCOUNTER — Ambulatory Visit: Payer: Medicare Other | Admitting: Radiation Oncology

## 2023-05-11 ENCOUNTER — Telehealth (INDEPENDENT_AMBULATORY_CARE_PROVIDER_SITE_OTHER): Payer: Medicare Other | Admitting: Internal Medicine

## 2023-05-11 VITALS — Ht 66.0 in | Wt 132.9 lb

## 2023-05-11 DIAGNOSIS — Z85118 Personal history of other malignant neoplasm of bronchus and lung: Secondary | ICD-10-CM

## 2023-05-11 DIAGNOSIS — J9611 Chronic respiratory failure with hypoxia: Secondary | ICD-10-CM

## 2023-05-11 DIAGNOSIS — Z5181 Encounter for therapeutic drug level monitoring: Secondary | ICD-10-CM

## 2023-05-11 DIAGNOSIS — K521 Toxic gastroenteritis and colitis: Secondary | ICD-10-CM

## 2023-05-11 DIAGNOSIS — J439 Emphysema, unspecified: Secondary | ICD-10-CM

## 2023-05-11 DIAGNOSIS — J84112 Idiopathic pulmonary fibrosis: Secondary | ICD-10-CM | POA: Diagnosis not present

## 2023-05-11 DIAGNOSIS — R053 Chronic cough: Secondary | ICD-10-CM

## 2023-05-11 DIAGNOSIS — R5383 Other fatigue: Secondary | ICD-10-CM

## 2023-05-11 NOTE — Progress Notes (Signed)
OV 11/13/2022 transfer of care to the ILD center with Dr. Marchelle Gearing.  Subjective:  Patient ID: Diane [name], female , DOB: [birth year] , age 67 y.o. , MRN: [id number] , ADDRESS: [address] [place] [address] PCP Ignatius Specking, MD Patient Care Team: Ignatius Specking, MD as PCP - General (Internal Medicine) Jena Gauss Gerrit Friends, MD as Consulting Physician (Gastroenterology) Janalyn Harder, MD (Inactive) as Consulting Physician (Dermatology)  This Provider for this visit: Treatment Team:  Attending Provider: Kalman Shan, MD    11/13/2022 -   Chief Complaint  Patient presents with   New Patient (Initial Visit)    New pt from [facility] for ILD. Patient filled out packet and for Esbriet but never started the medication. CT is for April 2024. Pt is just getting over PNA. Still has a slight cough. Just finished prednisone and Levaquin.      HPI Diane [name] 67 y.o. -presents with her husband [name].  [name] daughter-in-law Durel Salts with respiratory therapist at [facility].  She retire and then in the spring 2023 was diagnosed with right lower lobe non-small cell cancer stage II.  She is status post radiation and also chemotherapy.  Currently on observation treatment.  This is based on independent history from the husband and the patient and also review of the external records.  She is understandably quite frustrated with the onset of all these health issues given the fact she just retired.  Then approximately in September 2023 started noticing significant amount of dyspnea on exertion walking the dog and climbing stairs.  At this time desaturations with exercise to be noticed.  She has since been using oxygen and this helps her.  She is attended cardiac rehabilitation or pulmonary rehabilitation and this also helped her.  She was then given a diagnosis of pulmonary fibrosis/UIP.  Antifibrotic pirfenidone was started based on her history and also external  record review.  However due to some confusion of the paperwork this has not been started yet.  She is here for evaluation of her ILD.  Visualization of CT scan from 2015 shows presence of possible early ILD even back in 2015 with some honeycombing/emphysema at the base.  There was a craniocaudal gradient back pain.   Also of note she just went to a [place] cruise in mid January 2024.  When she came back on October 28, 2022.  Then on October 30, 2022 she had cough headaches no fever.  Her flu test and COVID was negative.  She is being treated for acute bronchitis with Levaquin and IM steroids and 6-day prednisone taper.  She will she was given a diagnosis of pneumonia actually.  She is almost back to baseline but still feels fatigued and still with some cough worse than her baseline. [place] Integrated Comprehensive ILD Questionnaire  Symptoms:  0 she believes she has had much rapid worsening in the last 6 months only.  Suspect this is after her radiation.  Current symptom scores are as below.   Past Medical History :  -She was told 20 years ago she had hiatal hernia and acid reflux but she says she has had no issues since then.  It is definitely not described in the current CT chest -She is awaiting a sleep study -She had seizures 12 years ago. -She has hypertension controlled on medication -Currently just finished antibiotic and steroid course-has had COVID-vaccine but never had COVID    ROS:  -Tired and does  have dry eyes -Has acid reflux for which she is taking omeprazole  FAMILY HISTORY of LUNG DISEASE:  *-Mother had asthma but there is no family history of lung disease  PERSONAL EXPOSURE HISTORY:   -She smoked between 1973 and 2022.  Smoked 1 pack/day and then quit.  In the 1980s and 1990s very briefly very rarely she did some cocaine.  She smoked some marijuana between 1976 and 2022 very minimal.  HOME  EXPOSURE and HOBBY DETAILS :  [place]   The home is 66 years old.  She is lived there for 19 years.  She recently got her down comforter 3 months ago but otherwise no organic antigen exposure in the house.  Although there is a leaky roof for the last 1 year in 2021 but there is no mold.  She is retired.  She works at [employer].  She works in [employer].  She did desk job.  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  INVESTIGATIONS: -As below    HRCT dec 2023 -personally visualized and independently agree  COMPARISON:  Chest CT 06/27/2022.   FINDINGS: Cardiovascular: Heart size is normal. There is no significant pericardial fluid, thickening or pericardial calcification. There is aortic atherosclerosis, as well as atherosclerosis of the great vessels of the mediastinum and the coronary arteries, including calcified atherosclerotic plaque in the left main, left anterior descending and right coronary arteries.   Mediastinum/Nodes: No pathologically enlarged mediastinal or left hilar lymph nodes. Soft tissue fullness in the right hilar region concerning for underlying lymphadenopathy, but poorly evaluated on today's noncontrast CT examination (best appreciated on axial image 60 of series 2). Esophagus is unremarkable in appearance. No axillary lymphadenopathy.   Lungs/Pleura: Treated lesion in the right lower lobe is now partially obscured by surrounding parenchymal changes, but is estimated to measure approximately 2.1 x 1.4 cm (axial image 114 of series 4). There has been a dramatic increase in surrounding areas of ground-glass attenuation, septal thickening, bronchiectasis and cystic changes, and regional architectural distortion in the surrounding lung parenchyma, presumably reflective of evolving postradiation changes. Several nodular areas of  architectural distortion are now noted in the right lung, most notably a macrolobulated nodule with spiculated margins in the right upper lobe (axial image 86 of series 4) measuring 1.9 x 1.3 cm, and a slightly ill-defined 1.9 x 1.1 cm nodular area of architectural distortion in the right lower lobe at the base (axial image 185 of series 4). Small right pleural effusion predominantly lying dependently. No left pleural effusion. In the remaining portions of the lungs are widespread areas of septal thickening, subpleural reticulation, traction bronchiectasis, peripheral bronchiolectasis and honeycombing, most evident in the mid to lower lung distribution. Inspiratory and expiratory imaging is unremarkable. There is also a background of mild diffuse bronchial wall thickening with moderate centrilobular and paraseptal emphysema.   Upper Abdomen: Aortic atherosclerosis.  Status post cholecystectomy.   Musculoskeletal: There are no aggressive appearing lytic or blastic lesions noted in the visualized portions of the skeleton.  IMPRESSION: 1. The appearance of the lungs is compatible with study. 2. Evolving postradiation changes in the right lung. Treated nodule in the superior segment of the right lower lobe appears grossly similar to the prior study, while there are new nodules in the right lung, most concerning of which is in the  right upper lobe (axial image 86 of series 4), currently measuring 1.9 x 1.3 cm. Close attention on follow-up studies is recommended. These new areas of nodularity could be treatment related or infectious or inflammatory in etiology, however, neoplasm is not excluded. 3. Mild diffuse bronchial wall thickening with moderate centrilobular and paraseptal emphysema; imaging findings suggestive of underlying COPD. 4. Aortic atherosclerosis, in addition to left main and 2 vessel coronary artery disease. Please note that although the presence of coronary artery  calcium documents the presence of coronary artery disease, the severity of this disease and any potential stenosis cannot be assessed on this non-gated CT examination. Assessment for potential risk factor modification, dietary therapy or pharmacologic therapy may be warranted, if clinically indicated.   Aortic Atherosclerosis (ICD10-I70.0) and Emphysema (ICD10-J43.9).     Electronically Signed   By: Trudie Reed M.D.   On: 09/27/2022 07:43   Telephone with [place] Dec 12/13/22 tient called the office 12/13/2022  to get her sleep study results.  While I was talking with the patient she was short of breath and shared that she had increased oxygen demands. She said this has been going on for several weeks since she returned from  a cruise she took with friends to celebrate finishing chemo and radiation . This was a [place] cruise in January 2024. She returned October 28, 2022. Then on October 30, 2022 she had cough headaches no fever. Her flu test and COVID was negative. She was treated for acute bronchitis and pneumonia  with Levaquin and IM steroids and 6-day prednisone taper.  She was feeling better but still fatigued and had a and still with some cough worse than her baseline  when she saw Dr. Marchelle Gearing  11/13/2022.  I ordered a CXR at [facility] (Closer to patient ).I also ordered a home tank with a higher liter flow capacity as her oxygen demand was so much higher.. CXR Results 2/28 confirmed >>  Diffuse bilateral interstitial pulmonary opacity, increased compared to prior examination, consistent with edema or infection. No focal airspace opacity. Unchanged post treatment appearance of the right midlung.I called the patient with the results of her scan and sent in prescriptions for Doxycycline 100 mg BID x 7 days and a prednisone taper Prednisone taper; 10 mg tablets: 4 tabs x 2 days, 3 tabs x 2 days, 2 tabs x 2 days 1 tab x 2 days then stop. She has a penicillin allergy, and history of QT  prolongation .  I  asked her to start Mucinex 1200 mg each morning with a glass of water and start using her flutter valve. She states her secretions are thick and hard to get up. I have asked her to use her albuterol nebs in the morning and evening while she is sick, and to wear her oxygen at bedtime at 2  liters. When I asked if she has had a fever, she said she did not think so, but when I asked if she had chills , she said she has had a lot of couch time covered in blankets, so endorses that she may have had fever. I asked Olegario Messier to check in with me Friday 12/15/2022 to ensure she was improving. We discussed that if she does not start turning around soon, we may need to consider IV antibiotics. We discussed if she gets worse , not better to call the office  or seek emergency care.  I reviewed her last echo from 11/15/2022, which shows EF of 60-65%, LV normal function,  Left ventricular diastolic parameters are consistent with Grade I diastolic  dysfunction. She denied any lower extremity edema. She does have a follow up CT Chest scheduled 01/25/2023 per oncology,I plan to  order one earlier if she does not improve.    12/20/2022 -office with Kandice Robinsons  Pt. Presents for short term follow up of multifocal pneumonia noted on CXR 12/13/2022. She was treated with Doxycycline and prednisone taper 12/13/2022 for her slow to resolve pneumonia . She states she is feeling much better. She states it is like night and day. She states she is back to her baseline. She is using 3 L [place] at rest and 4 L with exertion. Her oxygen demand is back to her baseline and she has her energy back. She does still have a cough, which is productive . She coughed in the office and she had some blood streaks in it. She states this is new, and she thinks it is from coughing. Secretions are mostly clear with some tan bits. She denies fever or chest pain. She has completed the Doxycycline and has her last dose of prednisone tomorrow. I will give her  an additional 7 days of 10 mg prednisone daily to cover her until she sees Dr. Marchelle Gearing next  week.  She started perfididone Monday 12/18/2022. I have told her to let us know if she has any stomach upset.  She is much more compliant with her oxygen use. She had her oxygen on today in the office. She is monitoring her oxygen levels. She is compliant with her Anoro daily. She is using her albuterol as needed. She has been using her Mucinex with flutter valve for secretion mobilization.   She does have headaches, which I think are from her cough. I have prescribed her cough medication with Codeine to see if that resolves the headaches.   She is still waiting in her CPAP machine and her 10 L home oxygen concentrator. My nurse will check with [employer] about the delay.      12/26/2022 -   Chief Complaint  Patient presents with   Follow-up    PFT F/up   r  HPI Diane [name] 67 y.o. -returns for follow-up.  She presents with her husband.  She tells me that after she saw me she got the prednisone taper and felt transiently better but then when she had a phone call with Jairo Ben it was noticed that she was quite symptomatic.  She was then called in for an acute visit and given 7-10 days of daily prednisone along with an antibiotic.  This then has helped her come back to baseline.  She feels she is currently at baseline.  At the time it was noticed that she was desaturating easily and she needed significant amount of oxygen to correct.  Her home oxygen concentrator only go up to 5 L.  Therefore we did a oxygen titration test today and she actually needs 8-10 L to correct and walk our standard 3 laps in the office.  Nevertheless she feels closer to baseline.  She still has a lot of fatigue and shortness of breath and she wants to improve on this.  She is open and willing to attend pulmonary rehabilitation.  Apparently referral has been made by me and by nurse practitioner but she still has not heard.   I did indicate to her we will make another referral again.  She also told me that she is now touch base with Army [place] the support  group chair and she is enjoying those conversations.  She had pulmonary function test that compared to a year ago shows significant decline at least 16% and the FVC decline in a year.  She just started Esbriet for this and as of yesterday has gone to 3 pills 3 times daily.  She is to have an echo and pulmonary artery pressures are reported as normal  Other than fatigue which she does not believe is due to pirfenidone she is actually tolerating the pirfenidone quite well.    02/06/2023 Pt. Presents for follow up 6 weeks after seeing Dr. Marchelle Gearing. She was started on Esbriet 10/31/2022.She initially was  Unable tolerate 3 tabs TID ( vomiting and constant  nausea). She dropped the dose in March to 2 tabs TID, and then tried to increase to 3 tabs daily again and was nauseated with weight loss of 5 pounds in 1 week. She dropped her dose back to 2 tablets TID  on  02/04/2023  and she is able to tolerate this dose. She is tolerating the lower dose well, already feels better after 2 days on the lower dose. She has been able to tolerate 2 tablets three times daily.We will hold at this dose for now until she sees Dr. Marchelle Gearing again in 6 weeks. LFT's drawn at the cancer center 01/25/2023 show  AST of 16, ALT if 11, Alk Phos of 64 and total bili of 0.3, so all WNL.She states she is no longer drinking wine , which is most likely helping maintain normal hepatic labs. Her CBC did show some anemia. HGB of 11.3 with a WBC of 7.5. This is about a  1.3 gram drop over previous HGB which was 12.6 four months ago. She has been started on Iron by Dr. Arbutus Ped, and she has follow up labs placed by Dr.Mohamed  Calicia looks great today. No fever, no change in secretions, no change in shortness of breath. She feels she is at her baseline. Current oxygen use is 4L [place] at rest and 6 L with exertion. She is  wearing her oxygen at all times, and she maintains saturations > 92%. She is not using her CPAP with everything she has going on but she is wearing her oxygen at bedtime. She does have a POC that her husband  purchased without insurance , and she uses this when driving in the car, as it meets her at rest oxygen needs.  She is doing well at pulmonary rehab. She comes to the [facility] rehab office and she feels she is getting a tremendous benefit from this. She is also meeting other ILD patient's and gets comfort and support from them.   There were some changes on her surveillance CT Chest  01/27/2023. Her previous CT scan in December 2023 showed finding compatible with a combination of emphysema and interstitial lung disease which has been progressive.  She also had evolving postradiation changes in the right lung and the treated nodule in the superior segment of the right lower lobe appeared grossly similar but there were new nodules within the right lung most concerning of which is in the right upper lobe and currently measures 1.9 x 1.3 cm requiring close monitoring.  She had a PET scan performed in early January 2024 and it showed no concerning findings for disease progression,  in the area of concern showed moderate metabolic activity that are nonspecific and may be related to infectious or inflammatory process but neoplasm could not be completely excluded. She has follow  up CT ordered for 3 months which will be July 2024. She knows to call immediately for any concerning symptoms or changes in her breathing.   We are doing a therapeutic trial with Breztri. She is currently on Anoro which she states does not seem to help with her breathing. We discussed that there is a steroid component to the [place] which can increase risk of pneumonia. She knows to stop using inhaler and call to be seen if she has any changes in her breathing status. I have also prescribed an albuterol inhaler as she does have episodes  of what sounds like upper airway wheezing.   We need to continue to monitor for weight loss. On the Esbriet 3 tabs TID she lost a significant amount of weight. We will re-evaluate her weight upon follow up with Dr. Marchelle Gearing in 6 weeks on the 2 tablets TID.  She will need repeat LFT's in 4-6 weeks , She sees Dr. Elly Modena 03/22/2023.      Test Results: CT Chest with contrast 01/27/2023 Slight increase in focal opacity involving the superior segment right lower lobe with increasing pleural thickening and loculated small pleural fluid.   The previously seen hypermetabolic nodules in the right upper lobe and lower lobe separately are again noted today in our appear relatively similar when adjusting for technique.   Persistent soft tissue fullness of the right lung hilum as well as some prominent nodes in the mediastinum and left hilum.   Underlying advanced emphysematous lung changes.   Aortic Atherosclerosis (ICD10-I70.0) and Emphysema (ICD10-J43.9).   OV 03/22/2023  Subjective:  Patient ID: Diane [name], female , DOB: 10-29-1955 , age 89 y.o. , MRN: [id number] , ADDRESS: [address] [place] [address] PCP Ignatius Specking, MD Patient Care Team: Ignatius Specking, MD as PCP - General (Internal Medicine) Jena Gauss, Gerrit Friends, MD as Consulting Physician (Gastroenterology) Janalyn Harder, MD (Inactive) as Consulting Physician (Dermatology)  This Provider for this visit: Treatment Team:  Attending Provider: Kalman Shan, MD  #IPF/UIP pattern #Associated pulmonary emphysema #Combined pulmonary fibrosis emphysema  -Chronic green sputum #Lung cancer #DNR , ECOG 3-4, Class 4 dyspnea  03/22/2023 -   Chief Complaint  Patient presents with   Follow-up    PFT:03/22/23 O2 4l     HPI Diane [name] 67 y.o. -returns for follow-up.  Since her last visit her symptoms have deteriorated.  Pulmonary function test also shows a steep decline in both FVC and particularly with  DLCO.  She tells me that ever since January 2024 she is having a steady decline but more so in the last 1 month.  And send the last 1 month her shower and getting dressed time has gotten worse.  She used to be able to get ready within 30 minutes now she is requiring 1 hour.  However at pulmonary rehabilitation the max oxygen she is required has been 8-10 L [4 L at rest] but 2 days ago abruptly she required 15 L and I was alerted about it.  She is denying any hemoptysis or fever or chills.  She has chronic green sputum but this is not change.  There is no worsening of cough there is no edema.  She is not on any anticoagulation.  In addition she is also noticing abrupt spasms of breathing for the last 1 month is almost a stridorous noise.  She was sucking air and it is loud it happens spontaneously.  Happens around 20 times a day.  Her husband comes  rushing from the next room.  But she states her pulse ox remained stable.  Correlating with all this is the pulmonary function test to significantly decline.  Also CT scan of the chest in April 2024 that I personally visualized is significantly worse.  Dr. Arbutus Ped is doing another CT scan in July 2024.  Today she went to her primary care physician at [facility] but I do not have the results she showed me lipid results but she believes the liver function test was normal.  She says that she has been given a life expectancy of less than 1 year she is upset about this.  But she says she is also realistic.  She says she has advanced directives she is working on it.  She is agreed to be a DNR.  Symptom wise significant cough is there but dyspnea is even worse.  She is willing to get Hycodan to help both symptoms  Overall with her quality of life and decline while she is looking for the reversible and looking to remain as healthy and as alive as possible she is also willing to be seen by palliative care at home.  We did a goals of care discussion about all this.  Filled out [employer] emergency backup electricity form.  Also filled out handicap placard form.    OV 04/13/2023  Subjective:  Patient ID: Diane [name], female , DOB: 03-25-1956 , age 4 y.o. , MRN: [id number] , ADDRESS: [address] [place] [address] PCP Ignatius Specking, MD Patient Care Team: Ignatius Specking, MD as PCP - General (Internal Medicine) Jena Gauss Gerrit Friends, MD as Consulting Physician (Gastroenterology) Janalyn Harder, MD (Inactive) as Consulting Physician (Dermatology)  This Provider for this visit: Treatment Team:  Attending Provider: Kalman Shan, MD  Type of visit: Video Virtual Visit Identification of patient Diane [name] with 1956/08/23 and MRN [id number] - 2 person identifier Risks: Risks, benefits, limitations of telephone visit explained. Patient understood and verbalized agreement to proceed Anyone else on call: husband Patient location: her home This provider location: [address]; [place]; [place] [address]. [facility]. [phone]    04/13/2023 -   Chief Complaint  Patient presents with   Follow-up    F/up discuss intervenous steroids and discuss some other medical issues.     HPI Diane [name] 67 y.o. - preents with husband on video visit . Has been betting worse. . IN interim, ABG without hypercapnia. CTA ruled out PE but showed worsning RLL nodule. Has known post XRT consolidation in RLL. No ILD flare up. Saw onc and rad onc. Notes reviewed. Advised SBRT mid uly x 5 sessions. She is worriued about ILD flare with XRT. But she is on pirfenidone. Wants me clearance pripr to SBRT and also if neeed to do high dose steroids    CTA 6/10;/24  Narrative & Impression  CLINICAL DATA:  Worsening shortness of breath and chest discomfort 3 weeks. Elevated D-dimer. Lung carcinoma. Undergoing chemotherapy. * Tracking Code: BO *   EXAM: CT ANGIOGRAPHY CHEST WITH CONTRAST   TECHNIQUE: Multidetector CT imaging of the  chest was performed using the standard protocol during bolus administration of intravenous contrast. Multiplanar CT image reconstructions and MIPs were obtained to evaluate the vascular anatomy.   RADIATION DOSE REDUCTION: This exam was performed according to the departmental dose-optimization program which includes automated exposure control, adjustment of the mA and/or kV according to patient size and/or use of iterative reconstruction technique.  CONTRAST:  75mL OMNIPAQUE IOHEXOL 350 MG/ML SOLN   COMPARISON:  01/25/2023   FINDINGS: Cardiovascular: Satisfactory opacification of pulmonary arteries noted, and no pulmonary emboli identified. No evidence of thoracic aortic dissection or aneurysm. Aortic and coronary atherosclerotic calcification incidentally noted.   Mediastinum/Nodes: No masses or pathologically enlarged lymph nodes identified.   Lungs/Pleura: Centrilobular and paraseptal emphysema again noted. Chronic changes of subpleural fibrosis and honeycombing again seen with bibasilar predominance. Areas of confluent opacity and central bronchiectasis are again seen in the right upper and lower lobes, without significant change. A poorly defined nodular opacity in the posterior right lower lobe on image 95/5 currently measures 1.9 x 1.8 cm, compared to 1.2 x 1.0 cm previously. No other new or enlarging sites of pulmonary opacity identified. No evidence of pleural effusion.   Upper abdomen: No acute findings.   Musculoskeletal: No suspicious bone lesions identified.   Review of the MIP images confirms the above findings.   IMPRESSION: No evidence of pulmonary embolism.   Increased size of 1.9 cm nodular opacity in posterior right lower lobe, suspicious for carcinoma.   No significant change in areas of right lung airspace disease and bronchiectasis, likely due to post radiation changes.   Chronic pulmonary emphysema and fibrosis.   Aortic Atherosclerosis  (ICD10-I70.0) and Emphysema (ICD10-J43.9).     Electronically Signed   By: Danae Orleans M.D.   On: 03/26/2023 16:10      NM PET scan 04/05/23  Narrative & Impression  CLINICAL DATA:  Subsequent treatment strategy for lung carcinoma. Chemotherapy ongoing. Concern for increasing RIGHT lower lobe nodule.   EXAM: NUCLEAR MEDICINE PET SKULL BASE TO THIGH   TECHNIQUE: 7.47 mCi F-18 FDG was injected intravenously. Full-ring PET imaging was performed from the skull base to thigh after the radiotracer. CT data was obtained and used for attenuation correction and anatomic localization.   Fasting blood glucose: 110 mg/dl   COMPARISON:  CT 54/06/8118 com PET-CT 10/04/2022   FINDINGS: Mediastinal blood pool activity: SUV max 2.0   Liver activity: SUV max NA   NECK: No hypermetabolic lymph nodes in the neck.   Incidental CT findings: None.   CHEST: Rounded focus of intense metabolic activity in the RIGHT lower lobe measures 2.7 cm with SUV max equal 15.4. This corresponds to nodule of concern on comparison CT.   Band of parenchymal consolidation in the more superior RIGHT lower lobe has mild metabolic activity favored post radiation change. Band parenchymal consolidation in the RIGHT upper lobe is also similar mild metabolic activity.   No hypermetabolic mediastinal lymph nodes.   Incidental CT findings: None.   ABDOMEN/PELVIS: No abnormal hypermetabolic activity within the liver, pancreas, adrenal glands, or spleen. No hypermetabolic lymph nodes in the abdomen or pelvis.   Incidental CT findings: None.   SKELETON: No focal hypermetabolic activity to suggest skeletal metastasis.   Incidental CT findings: None.   IMPRESSION: 1. Persistent pulmonary nodule in the inferior RIGHT lower lobe with intense metabolic activity is concerning for lung cancer recurrence. 2. Consolidation in the superior aspect of the RIGHT lower lobe and RIGHT upper lobe is similar to prior and  most consistent with post radiation change. 3. No hypermetabolic mediastinal lymph nodes. 4. No distant metastatic disease.     Electronically Signed   By: Genevive Bi M.D.   On: 04/09/2023 10:22      OV 05/11/2023  Subjective:  Patient ID: Diane [name], female , DOB: 30-Aug-1956 , age 78 y.o. , MRN: [id number] ,  ADDRESS: [address] [place] [address] PCP Ignatius Specking, MD Patient Care Team: Ignatius Specking, MD as PCP - General (Internal Medicine) Jena Gauss Gerrit Friends, MD as Consulting Physician (Gastroenterology) Janalyn Harder, MD (Inactive) as Consulting Physician (Dermatology)  This Provider for this visit: Treatment Team:  Attending Provider: Kalman Shan, MD    05/11/2023 -   Chief Complaint  Patient presents with   Follow-up    F/up on IPF, cough syrup concerning     Type of visit: Video Virtual Visit Identification of patient Diane [name] with September 01, 1956 and MRN [id number] - 2 person identifier Risks: Risks, benefits, limitations of telephone visit explained. Patient understood and verbalized agreement to proceed Anyone else on call: no Patient location: her home This provider location: [address]; [place]; [place] [address]. [facility]. [phone]     HPI Diane [name] 67 y.o. -presents for video visit visit.  In this video visit she tells me that while she is tolerating pirfenidone overall well she is having intermittent diarrhea.  She takes a cup of coffee in the morning but she really does not do any other added sugar.  She is not taking Imodium.  The other thing that is affecting her quality of life is cough.  The Hycodan is really helping but she is wondering whether she can take it on a regular basis.  I did indicate to her that with symptom control and quality of life maintenance is highly important with advanced diseases.  Therefore she is going embraced taking Hycodan on a regular basis.  She  is complete her radiation treatment.  It was 3 settings no side effects but she is feeling more fatigued she said but when I did the fatigue scale on her she rated as 3 out of 5 which is similar to June 2024.  Review of the labs indicate she got mild anemia hemoglobin of 10.7 in June 2024.  She says she stopped her iron treatment because her iron levels are saturated.  Provided indicate to her that mild anemia possibly getting worse could be contributing to fatigue.  She is agreed for CBC    She also needs and liver function test. Esbriet/Pirfenidone requires intensive drug monitoring due to high concerns for Adverse effects of , including  Drug Induced Liver Injury, significant GI side effects that include but not limited to Diarrhea, Nausea, Vomiting,  and other system side effects that include Fatigue, headaches, weight loss and other side effects such as skin rash. These will be monitored with  blood work such as LFT initially once a month for 6 months and then quarterly  Her neck CT scan is in October 2024.  She feels she will have the CT scan and then this with me for follow-up.  Otherwise she continues on 4 L at rest and over 10 L with exertion.   SYMPTOM SCALE - ILD 11/13/2022 03/22/2023 4L [place] rest, 10-15L [place] exertiona  Current weight    O2 use Exertional walking the dog.   Shortness of Breath 0 -> 5 scale with 5 being worst (score 6 If unable to do)   At rest 0 0  Simple tasks - showers, clothes change, eating, shaving 3 4  Household (dishes, doing bed, laundry) 3 4  Shopping 2 4  Walking level at own pace 4 4  Walking up Stairs 4 5  Total (30-36) Dyspnea Score 16 21      Non-dyspnea symptoms (0-> 5 scale) 11/13/2022  03/22/2023   How bad is your cough? 2 -cough is better after quitting smoking 2  How bad is your fatigue 2 4 (3 on 05/11/2023)  How bad is nausea 0 2  How bad is vomiting?  0 0  How bad is diarrhea? 0 0  How bad is anxiety? 3 2  How bad is depression 1 2  Any chronic  pain - if so where and how bad 0 x    Simple office walk 185 feet x  3 laps goal with forehead probe 12/26/2022  12/26/2022   O2 used RA   Number laps completed 1/2 lap of 1 ap   Comments about pace normal   Resting Pulse Ox/HR 94% and 90/min   Final Pulse Ox/HR 87% and 90/min   Desaturated </= 88% yes   Desaturated <= 3% points yes   Got Tachycardic >/= 90/min yes   Symptoms at end of test dyspnea   Miscellaneous comments Waked on 5L  - 96% sitting -> still deats Needed 8-10L to correct and walk all 3 laps      PFT     Latest Ref Rng & Units 03/21/2023    2:53 PM 12/26/2022    1:53 PM 01/04/2022    9:51 AM  ILD indicators  FVC-Pre L 1.85  2.10  2.54   FVC-Predicted Pre % 54  64  76   FVC-Post L  2.11  2.69   FVC-Predicted Post %  64  81   TLC L  3.68  3.94   TLC Predicted %  70  75   DLCO uncorrected ml/min/mmHg 2.82  6.99  9.15   DLCO UNC %Pred % 13  34  44   DLCO Corrected ml/min/mmHg 3.04  6.99  9.15   DLCO COR %Pred % 14  34  44       LAB RESULTS last 96 hours No results found.  LAB RESULTS last 90 days Recent Results (from the past 2160 hour(s))  Pulmonary function test     Status: None   Collection Time: 03/21/23  2:53 PM  Result Value Ref Range   FVC-Pre 1.85 L   FVC-%Pred-Pre 54 %   FEV1-Pre 1.47 L   FEV1-%Pred-Pre 56 %   FEV6-Pre 1.85 L   FEV6-%Pred-Pre 56 %   Pre FEV1/FVC ratio 79 %   FEV1FVC-%Pred-Pre 103 %   Pre FEV6/FVC Ratio 100 %   FEV6FVC-%Pred-Pre 103 %   FEF 25-75 Pre 1.30 L/sec   FEF2575-%Pred-Pre 57 %   DLCO unc 2.82 ml/min/mmHg   DLCO unc % pred 13 %   DLCO cor 3.04 ml/min/mmHg   DLCO cor % pred 14 %   DL/VA 1.61 ml/min/mmHg/L   DL/VA % pred 45 %  Hepatic function panel     Status: Abnormal   Collection Time: 03/22/23  3:59 PM  Result Value Ref Range   Total Bilirubin 0.3 0.2 - 1.2 mg/dL   Bilirubin, Direct 0.0 0.0 - 0.3 mg/dL   Alkaline Phosphatase 68 39 - 117 U/L   AST 21 0 - 37 U/L   ALT 15 0 - 35 U/L   Total Protein 8.7  (H) 6.0 - 8.3 g/dL   Albumin 4.2 3.5 - 5.2 g/dL  D-Dimer, Quantitative     Status: Abnormal   Collection Time: 03/22/23  3:59 PM  Result Value Ref Range   D-Dimer, Quant 0.67 (H) <0.50 mcg/mL FEU    Comment: . The D-Dimer test is used frequently to exclude an  acute PE or DVT. In patients with a low to moderate clinical risk assessment and a D-Dimer result <0.50 mcg/mL FEU, the likelihood of a PE or DVT is very low. However, a thromboembolic event should not be excluded solely on the basis of the D-Dimer level. Increased levels of D-Dimer are associated with a PE, DVT, DIC, malignancies, inflammation, sepsis, surgery, trauma, pregnancy, and advancing patient age. [Jama 2006 11:295(2):199-207] . For additional information, please refer to: http://education.questdiagnostics.com/faq/FAQ149 (This link is being provided for informational/ educational purposes only) .   Troponin I     Status: None   Collection Time: 03/22/23  3:59 PM  Result Value Ref Range   Troponin I <3 < OR = 47 ng/L    Comment: . In accord with published recommendations, serial testing of troponin I at intervals of 2 to 4 hours for up to 12 to 24 hours is suggested in order to corroborate a single troponin I result. An elevated troponin alone is not sufficient to make the diagnosis of MI. .   B Nat Peptide     Status: None   Collection Time: 03/22/23  3:59 PM  Result Value Ref Range   Pro B Natriuretic peptide (BNP) 58.0 0.0 - 100.0 pg/mL  CBC with Differential/Platelet     Status: Abnormal   Collection Time: 03/22/23  3:59 PM  Result Value Ref Range   WBC 6.6 4.0 - 10.5 K/uL   RBC 3.55 (L) 3.87 - 5.11 Mil/uL   Hemoglobin 10.7 (L) 12.0 - 15.0 g/dL   HCT 40.9 (L) 81.1 - 91.4 %   MCV 92.3 78.0 - 100.0 fl   MCHC 32.8 30.0 - 36.0 g/dL   RDW 78.2 95.6 - 21.3 %   Platelets 365.0 150.0 - 400.0 K/uL   Neutrophils Relative % 67.9 43.0 - 77.0 %   Lymphocytes Relative 20.7 12.0 - 46.0 %   Monocytes Relative  7.3 3.0 - 12.0 %   Eosinophils Relative 3.2 0.0 - 5.0 %   Basophils Relative 0.9 0.0 - 3.0 %   Neutro Abs 4.5 1.4 - 7.7 K/uL   Lymphs Abs 1.4 0.7 - 4.0 K/uL   Monocytes Absolute 0.5 0.1 - 1.0 K/uL   Eosinophils Absolute 0.2 0.0 - 0.7 K/uL   Basophils Absolute 0.1 0.0 - 0.1 K/uL  Sed Rate (ESR)     Status: Abnormal   Collection Time: 03/22/23  3:59 PM  Result Value Ref Range   Sed Rate 80 (H) 0 - 30 mm/hr  Blood gas, arterial     Status: Abnormal   Collection Time: 03/29/23 12:55 PM  Result Value Ref Range   pH, Arterial 7.48 (H) 7.35 - 7.45   pCO2 arterial 37 32 - 48 mmHg   pO2, Arterial 103 83 - 108 mmHg   Bicarbonate 27.6 20.0 - 28.0 mmol/L   Acid-Base Excess 4.0 (H) 0.0 - 2.0 mmol/L   O2 Saturation 99.9 %   Patient temperature 37.0    Collection site RIGHT RADIAL    Drawn by [id number]    Allens test (pass/fail) PASS PASS    Comment: Performed at [facility], [address], [place], [place] [address]  Rad Jeralene Huff Session Summary     Status: None   Collection Time: 05/01/23  1:50 PM  Result Value Ref Range   Course ID C2_Chest    Course Intent Curative    Course Start Date 04/12/2023 11:32 AM    Session Number 1    Course First  Treatment Date 05/01/2023  1:46 PM    Course Last Treatment Date 05/01/2023  1:48 PM    Course Elapsed Days 0    Reference Point ID SBRT_Lung_R dp    Reference Point Dosage Given to Date 18 Gy   Reference Point Session Dosage Given 18 Gy   Plan ID Lung_Rt_SBRT    Plan Fractions Treated to Date 1    Plan Total Fractions Prescribed 3    Plan Prescribed Dose Per Fraction 18 Gy   Plan Total Prescribed Dose 54.000000 Gy   Plan Primary Reference Point SBRT_Lung_R dp   Rad Onc Aria Session Summary     Status: None   Collection Time: 05/03/23  1:38 PM  Result Value Ref Range   Course ID C2_Chest    Course Intent Curative    Course Start Date 04/12/2023 11:32 AM    Session Number 2    Course First Treatment Date 05/01/2023  1:46 PM    Course Last  Treatment Date 05/03/2023  1:36 PM    Course Elapsed Days 2    Reference Point ID SBRT_Lung_R dp    Reference Point Dosage Given to Date 36 Gy   Reference Point Session Dosage Given 18 Gy   Plan ID Lung_Rt_SBRT    Plan Fractions Treated to Date 2    Plan Total Fractions Prescribed 3    Plan Prescribed Dose Per Fraction 18 Gy   Plan Total Prescribed Dose 54.000000 Gy   Plan Primary Reference Point SBRT_Lung_R dp   Rad Onc Aria Session Summary     Status: None   Collection Time: 05/07/23  1:42 PM  Result Value Ref Range   Course ID C2_Chest    Course Intent Curative    Course Start Date 04/12/2023 11:32 AM    Session Number 3    Course First Treatment Date 05/01/2023  1:46 PM    Course Last Treatment Date 05/07/2023  1:41 PM    Course Elapsed Days 6    Reference Point ID SBRT_Lung_R dp    Reference Point Dosage Given to Date 54 Gy   Reference Point Session Dosage Given 18 Gy   Plan ID Lung_Rt_SBRT    Plan Fractions Treated to Date 3    Plan Total Fractions Prescribed 3    Plan Prescribed Dose Per Fraction 18 Gy   Plan Total Prescribed Dose 54.000000 Gy   Plan Primary Reference Point SBRT_Lung_R dp          has a past medical history of Anxiety, Arthritis, C. difficile diarrhea (2015), COPD (chronic obstructive pulmonary disease) (HCC), Depression, Eczema, H pylori ulcer, Headache(784.0), Hypercholesterolemia, Hypertension, Interstitial lung disease (HCC), Kidney stones, Pneumonia, Seizures (HCC), and Shingles.   reports that she quit smoking about 19 months ago. Her smoking use included cigarettes. She started smoking about 31 years ago. She has a 15 pack-year smoking history. She has never used smokeless tobacco.  Past Surgical History:  Procedure Laterality Date   ABDOMINAL HYSTERECTOMY     ADENOIDECTOMY     APPENDECTOMY     BRONCHIAL BIOPSY  12/20/2021   Procedure: BRONCHIAL BIOPSIES;  Surgeon: Josephine Igo, DO;  Location: [facility];  Service: Pulmonary;;   BRONCHIAL  BRUSHINGS  12/20/2021   Procedure: BRONCHIAL BRUSHINGS;  Surgeon: Josephine Igo, DO;  Location: [facility];  Service: Pulmonary;;   BRONCHIAL NEEDLE ASPIRATION BIOPSY  12/20/2021   Procedure: BRONCHIAL NEEDLE ASPIRATION BIOPSIES;  Surgeon: Josephine Igo, DO;  Location: [facility];  Service: Pulmonary;;  CESAREAN SECTION     CHOLECYSTECTOMY     COLONOSCOPY  06/10/2012   RMR: Colonic polyps -removed as described above.    FINE NEEDLE ASPIRATION  12/20/2021   Procedure: FINE NEEDLE ASPIRATION (FNA) LINEAR;  Surgeon: Josephine Igo, DO;  Location: [facility];  Service: Pulmonary;;   KNEE ARTHROSCOPY  1973   right knee,  torn cart   LUMBAR DISC SURGERY  05/12/2015   L4 L5   LUMBAR LAMINECTOMY/DECOMPRESSION MICRODISCECTOMY Left 05/12/2015   Procedure: Left L4-5 Microdiscectomy;  Surgeon: Eldred Manges, MD;  Location: [facility];  Service: Orthopedics;  Laterality: Left;   TONSILLECTOMY     VIDEO BRONCHOSCOPY WITH ENDOBRONCHIAL ULTRASOUND Bilateral 12/20/2021   Procedure: VIDEO BRONCHOSCOPY WITH ENDOBRONCHIAL ULTRASOUND;  Surgeon: Josephine Igo, DO;  Location: [facility];  Service: Pulmonary;  Laterality: Bilateral;   VIDEO BRONCHOSCOPY WITH RADIAL ENDOBRONCHIAL ULTRASOUND  12/20/2021   Procedure: RADIAL ENDOBRONCHIAL ULTRASOUND;  Surgeon: Josephine Igo, DO;  Location: [facility];  Service: Pulmonary;;    Allergies  Allergen Reactions   Alpha-Gal Other (See Comments)    Other reaction(s): Abdominal Pain   Other Other (See Comments)   Penicillins     Unknown type of reaction    Immunization History  Administered Date(s) Administered   Fluad Quad(high Dose 65+) 08/08/2022   Influenza, Quadrivalent, Recombinant, Inj, Pf 07/24/2019   Influenza,inj,Quad PF,6+ Mos 07/15/2016, 08/03/2017, 08/29/2018   Influenza-Unspecified 07/16/2021   Moderna Sars-Covid-2 Vaccination 12/25/2019, 01/24/2020   PNEUMOCOCCAL CONJUGATE-20 08/08/2022   Pneumococcal Polysaccharide-23 09/29/2013    RSV,unspecified 09/14/2022    Family History  Problem Relation Age of Onset   Arthritis Mother    Asthma Mother    Depression Mother    Hyperlipidemia Mother    Varicose Veins Mother    Arthritis Father    Heart disease Father    Hyperlipidemia Father    Vision loss Father    Cancer Sister    COPD Sister    Early death Sister    Cancer Brother    Alcohol abuse Maternal Grandfather    Colon cancer Neg Hx      Current Outpatient Medications:    albuterol (PROVENTIL) (5 MG/ML) 0.5% nebulizer solution, Take 2.5 mg by nebulization every 6 (six) hours as needed for wheezing or shortness of breath., Disp: , Rfl:    albuterol (VENTOLIN HFA) 108 (90 Base) MCG/ACT inhaler, Inhale 2 puffs into the lungs every 6 (six) hours as needed for wheezing or shortness of breath., Disp: 8 g, Rfl: 2   ALPRAZolam (XANAX) 0.5 MG tablet, Take 0.5 mg by mouth daily as needed., Disp: , Rfl:    cetirizine (ZYRTEC) 10 MG tablet, Take 10 mg by mouth at bedtime., Disp: , Rfl:    EPINEPHrine (AUVI-Q) 0.3 mg/0.3 mL IJ SOAJ injection, Inject 0.3 mLs (0.3 mg total) into the muscle as needed for anaphylaxis., Disp: 2 each, Rfl: 1   fluticasone (FLONASE) 50 MCG/ACT nasal spray, Place 2 sprays into both nostrils daily. (Patient taking differently: Place 2 sprays into both nostrils daily as needed for allergies.), Disp: 16 g, Rfl: 5   HYDROcodone bit-homatropine (HYCODAN) 5-1.5 MG/5ML syrup, TAKE 1 TEASPOONFUL BY MOUTH EVERY 6 HOURS AS NEEDED FOR COUGH, Disp: 240 mL, Rfl: 0   omeprazole (PRILOSEC) 20 MG capsule, Take 1 capsule (20 mg total) by mouth 2 (two) times daily. (Needs to be seen before next refill), Disp: 60 capsule, Rfl: 0   OVER THE COUNTER MEDICATION, Take 1 capsule by mouth daily. Probiotic,  Disp: , Rfl:    Pirfenidone (ESBRIET) 267 MG TABS, Take 3 tablets (801 mg total) by mouth 3 (three) times daily with meals. (Account: [email or web address]), Disp: 270 tablet, Rfl: 4   rosuvastatin (CRESTOR) 10 MG  tablet, Take 10 mg by mouth at bedtime., Disp: , Rfl:    umeclidinium-vilanterol (ANORO ELLIPTA) 62.5-25 MCG/ACT AEPB, Inhale 1 puff into the lungs daily., Disp: 60 each, Rfl: 3   Budeson-Glycopyrrol-Formoterol (BREZTRI AEROSPHERE) 160-9-4.8 MCG/ACT AERO, Inhale 2 puffs into the lungs in the morning and at bedtime. (Patient not taking: Reported on 04/13/2023), Disp: 2 each, Rfl: 0      Objective:   Vitals:   05/11/23 1133  Weight: 132 lb 14.4 oz (60.3 kg)  Height: 5\' 6"  (1.676 m)    Estimated body mass index is 21.45 kg/m as calculated from the following:   Height as of this encounter: 5\' 6"  (1.676 m).   Weight as of this encounter: 132 lb 14.4 oz (60.3 kg).  @WEIGHTCHANGE @  [employer]   05/11/23 1133  Weight: 132 lb 14.4 oz (60.3 kg)     Physical Exam   General: No distress. Looks stable O2 at rest: yes Cane present: no Sitting in wheel chair: no Frail: no Obese: no Neuro: Alert and Oriented x 3. GCS 15. Speech normal Psych: Pleasant       Assessment:       ICD-10-CM   1. IPF (idiopathic pulmonary fibrosis) (HCC)  J84.112 CBC w/Diff    Hepatic function panel    2. Pulmonary emphysema, unspecified emphysema type (HCC)  J43.9     3. Chronic respiratory failure with hypoxia (HCC)  J96.11     4. Chronic cough  R05.3     5. Diarrhea due to drug  K52.1     6. Other fatigue  R53.83     7. Medication monitoring encounter  Z51.81          Plan:     Patient Instructions     ICD-10-CM   1. IPF (idiopathic pulmonary fibrosis) (HCC)  J84.112     2. Pulmonary emphysema, unspecified emphysema type (HCC)  J43.9     3. History of lung cancer  Z85.118     4. Chronic respiratory failure with hypoxia (HCC)  J96.11     5. Goals of care, counseling/discussion  Z71.89     6. DNR (do not resuscitate)  Z66     7. Chronic cough  R05.3     8. Dyspnea, unspecified type  R06.00     9. Advance directive discussed with patient  Z71.89        IPF (idiopathic  pulmonary fibrosis) (HCC) Pulmonary emphysema, unspecified emphysema type (HCC) History of lung cancer- stgage 2 b with recurrence right lower lobe June 2024 Chronic respiratory failure with hypoxia (HCC)  Goals of care, counseling/discussion Advance directive DNR (do not resuscitate) Chronic cough Dyspnea, unspecified type   -stable since last visit; ongoing oxygen needs - s/p radiation to lung endinglast week - diarrhea with pirfenidone - fatiuge due to IPF and cancer and also pirfenidone and possibly postradiation -Severe cough due to fibrosis and lung cancer Hycodan helping   Plan   -Continue oxygen 4 L at rest [likely need 4 L nasal cannula at night as well] and use 10-15 L with exertion  -For cough  - continue Hycodan 5 mL twice daily for palliative relief of both shortness of breath and cough  -Use as needed to control cough  -For diarrhea related  to pirfenidone  -Start Imodium and take it as needed or even daily  -For recent anemia and pirfenidone drug toxicity monitoring  -Check CBC and liver function test at [facility] home palliative care if they are already visiting with you  -Glad you have advanced directives done and support DNR  Hold off  high-dose IV steroids for now; can consider in future based on XRT outcome     Plan - late October 2024; 30-minute visit but after CT scan of the chest done by oncology   FOLLOWUP Return in about 3 months (around 08/11/2023) for ILD, Lung Cancer, with Dr Marchelle Gearing, Chronic Respiratory Failure, chronic cough, 30 min visit.    SIGNATURE    Dr. Kalman Shan, M.D., F.C.C.P,  Pulmonary and Critical Care Medicine Staff Physician, [facility] Director - Interstitial Lung Disease  Program  Pulmonary Fibrosis [facility] at [facility] [place], [place], [address]  Pager: [phone], If no answer or between  15:00h - 7:00h: call [phone] Telephone: [phone]  12:16 PM 05/11/2023

## 2023-05-11 NOTE — Patient Instructions (Addendum)
ICD-10-CM   1. IPF (idiopathic pulmonary fibrosis) (HCC)  J84.112     2. Pulmonary emphysema, unspecified emphysema type (HCC)  J43.9     3. History of lung cancer  Z85.118     4. Chronic respiratory failure with hypoxia (HCC)  J96.11     5. Goals of care, counseling/discussion  Z71.89     6. DNR (do not resuscitate)  Z66     7. Chronic cough  R05.3     8. Dyspnea, unspecified type  R06.00     9. Advance directive discussed with patient  Z71.89        IPF (idiopathic pulmonary fibrosis) (HCC) Pulmonary emphysema, unspecified emphysema type (HCC) History of lung cancer- stgage 2 b with recurrence right lower lobe June 2024 Chronic respiratory failure with hypoxia (HCC)  Goals of care, counseling/discussion Advance directive DNR (do not resuscitate) Chronic cough Dyspnea, unspecified type   -stable since last visit; ongoing oxygen needs - s/p radiation to lung endinglast week - diarrhea with pirfenidone - fatiuge due to IPF and cancer and also pirfenidone and possibly postradiation -Severe cough due to fibrosis and lung cancer Hycodan helping   Plan   -Continue oxygen 4 L at rest [likely need 4 L nasal cannula at night as well] and use 10-15 L with exertion  -For cough  - continue Hycodan 5 mL twice daily for palliative relief of both shortness of breath and cough  -Use as needed to control cough  -For diarrhea related to pirfenidone  -Start Imodium and take it as needed or even daily  -For recent anemia and pirfenidone drug toxicity monitoring  -Check CBC and liver function test at [facility] home palliative care if they are already visiting with you  -Glad you have advanced directives done and support DNR  Hold off  high-dose IV steroids for now; can consider in future based on XRT outcome     Plan - late October 2024; 30-minute visit but after CT scan of the chest done by oncology

## 2023-05-14 ENCOUNTER — Other Ambulatory Visit (HOSPITAL_COMMUNITY)
Admission: RE | Admit: 2023-05-14 | Discharge: 2023-05-14 | Disposition: A | Discharge status: Home / Self Care | Payer: Medicare Other | Source: Ambulatory Visit | Attending: Internal Medicine | Admitting: Internal Medicine

## 2023-05-14 DIAGNOSIS — J84112 Idiopathic pulmonary fibrosis: Secondary | ICD-10-CM | POA: Insufficient documentation

## 2023-05-14 LAB — CBC WITH DIFFERENTIAL/PLATELET
Abs Immature Granulocytes: 0.02 10*3/uL (ref 0.00–0.07)
Basophils Absolute: 0 10*3/uL (ref 0.0–0.1)
Basophils Relative: 1 %
Eosinophils Absolute: 0.2 10*3/uL (ref 0.0–0.5)
Eosinophils Relative: 4 %
HCT: 31 % — ABNORMAL LOW (ref 36.0–46.0)
Hemoglobin: 10.2 g/dL — ABNORMAL LOW (ref 12.0–15.0)
Immature Granulocytes: 0 %
Lymphocytes Relative: 14 %
Lymphs Abs: 0.8 10*3/uL (ref 0.7–4.0)
MCH: 30.4 pg (ref 26.0–34.0)
MCHC: 32.9 g/dL (ref 30.0–36.0)
MCV: 92.3 fL (ref 80.0–100.0)
Monocytes Absolute: 0.5 10*3/uL (ref 0.1–1.0)
Monocytes Relative: 8 %
Neutro Abs: 4.3 10*3/uL (ref 1.7–7.7)
Neutrophils Relative %: 73 %
Platelets: 279 10*3/uL (ref 150–400)
RBC: 3.36 MIL/uL — ABNORMAL LOW (ref 3.87–5.11)
RDW: 13 % (ref 11.5–15.5)
WBC: 5.8 10*3/uL (ref 4.0–10.5)
nRBC: 0 % (ref 0.0–0.2)

## 2023-05-14 LAB — HEPATIC FUNCTION PANEL
ALT: 20 U/L (ref 0–44)
AST: 25 U/L (ref 15–41)
Albumin: 3.7 g/dL (ref 3.5–5.0)
Alkaline Phosphatase: 61 U/L (ref 38–126)
Bilirubin, Direct: <0.1 mg/dL (ref 0.0–0.2)
Total Bilirubin: 0.6 mg/dL (ref 0.3–1.2)
Total Protein: 8.2 g/dL — ABNORMAL HIGH (ref 6.5–8.1)

## 2023-05-14 NOTE — Progress Notes (Signed)
Anemia slightly worse . Pls talk to PCP Diane Beam B, MD. Will not be calling with your result  Hemoglobin 12.0 - 15.0 g/dL 16.1 Low  09.6 Low  04.5 Low  12.6 13.4 12.0 11.5 Low

## 2023-05-18 ENCOUNTER — Emergency Department (HOSPITAL_BASED_OUTPATIENT_CLINIC_OR_DEPARTMENT_OTHER): Payer: Medicare Other

## 2023-05-18 ENCOUNTER — Encounter (HOSPITAL_BASED_OUTPATIENT_CLINIC_OR_DEPARTMENT_OTHER): Payer: Self-pay

## 2023-05-18 ENCOUNTER — Other Ambulatory Visit: Payer: Self-pay

## 2023-05-18 ENCOUNTER — Emergency Department (HOSPITAL_BASED_OUTPATIENT_CLINIC_OR_DEPARTMENT_OTHER)
Admission: EM | Admit: 2023-05-18 | Discharge: 2023-05-18 | Disposition: A | Discharge status: Home / Self Care | Payer: Medicare Other | Attending: Emergency Medicine | Admitting: Emergency Medicine

## 2023-05-18 ENCOUNTER — Other Ambulatory Visit: Payer: Self-pay | Admitting: Internal Medicine

## 2023-05-18 DIAGNOSIS — Z85118 Personal history of other malignant neoplasm of bronchus and lung: Secondary | ICD-10-CM | POA: Insufficient documentation

## 2023-05-18 DIAGNOSIS — R091 Pleurisy: Secondary | ICD-10-CM | POA: Diagnosis not present

## 2023-05-18 DIAGNOSIS — Z7951 Long term (current) use of inhaled steroids: Secondary | ICD-10-CM | POA: Diagnosis not present

## 2023-05-18 DIAGNOSIS — J439 Emphysema, unspecified: Secondary | ICD-10-CM | POA: Insufficient documentation

## 2023-05-18 DIAGNOSIS — J449 Chronic obstructive pulmonary disease, unspecified: Secondary | ICD-10-CM | POA: Diagnosis not present

## 2023-05-18 DIAGNOSIS — Z79899 Other long term (current) drug therapy: Secondary | ICD-10-CM | POA: Diagnosis not present

## 2023-05-18 DIAGNOSIS — I700 Atherosclerosis of aorta: Secondary | ICD-10-CM | POA: Insufficient documentation

## 2023-05-18 DIAGNOSIS — Z1152 Encounter for screening for COVID-19: Secondary | ICD-10-CM | POA: Diagnosis not present

## 2023-05-18 DIAGNOSIS — J8410 Pulmonary fibrosis, unspecified: Secondary | ICD-10-CM | POA: Diagnosis not present

## 2023-05-18 DIAGNOSIS — R911 Solitary pulmonary nodule: Secondary | ICD-10-CM | POA: Diagnosis not present

## 2023-05-18 DIAGNOSIS — I10 Essential (primary) hypertension: Secondary | ICD-10-CM | POA: Insufficient documentation

## 2023-05-18 DIAGNOSIS — I119 Hypertensive heart disease without heart failure: Secondary | ICD-10-CM | POA: Diagnosis not present

## 2023-05-18 DIAGNOSIS — J479 Bronchiectasis, uncomplicated: Secondary | ICD-10-CM | POA: Insufficient documentation

## 2023-05-18 DIAGNOSIS — F419 Anxiety disorder, unspecified: Secondary | ICD-10-CM | POA: Insufficient documentation

## 2023-05-18 DIAGNOSIS — Z9981 Dependence on supplemental oxygen: Secondary | ICD-10-CM | POA: Diagnosis not present

## 2023-05-18 DIAGNOSIS — R079 Chest pain, unspecified: Secondary | ICD-10-CM | POA: Diagnosis present

## 2023-05-18 LAB — BASIC METABOLIC PANEL
Anion gap: 9 (ref 5–15)
BUN: 11 mg/dL (ref 8–23)
CO2: 28 mmol/L (ref 22–32)
Calcium: 9.8 mg/dL (ref 8.9–10.3)
Chloride: 99 mmol/L (ref 98–111)
Creatinine, Ser: 0.6 mg/dL (ref 0.44–1.00)
GFR, Estimated: >60 mL/min (ref >60)
Glucose, Bld: 100 mg/dL — ABNORMAL HIGH (ref 70–99)
Potassium: 3.8 mmol/L (ref 3.5–5.1)
Sodium: 136 mmol/L (ref 135–145)

## 2023-05-18 LAB — CBC
HCT: 34.5 % — ABNORMAL LOW (ref 36.0–46.0)
Hemoglobin: 11.3 g/dL — ABNORMAL LOW (ref 12.0–15.0)
MCH: 30 pg (ref 26.0–34.0)
MCHC: 32.8 g/dL (ref 30.0–36.0)
MCV: 91.5 fL (ref 80.0–100.0)
Platelets: 263 10*3/uL (ref 150–400)
RBC: 3.77 MIL/uL — ABNORMAL LOW (ref 3.87–5.11)
RDW: 13 % (ref 11.5–15.5)
WBC: 7.3 10*3/uL (ref 4.0–10.5)
nRBC: 0 % (ref 0.0–0.2)

## 2023-05-18 LAB — RESP PANEL BY RT-PCR (RSV, FLU A&B, COVID)  RVPGX2
Influenza A by PCR: NEGATIVE
Influenza B by PCR: NEGATIVE
Resp Syncytial Virus by PCR: NEGATIVE
SARS Coronavirus 2 by RT PCR: NEGATIVE

## 2023-05-18 LAB — TROPONIN I (HIGH SENSITIVITY): Troponin I (High Sensitivity): 3 ng/L (ref <18)

## 2023-05-18 MED ORDER — IOHEXOL 350 MG/ML SOLN
100.0000 mL | Freq: Once | INTRAVENOUS | Status: AC | PRN
  Administered 2023-05-18: 60 mL via INTRAVENOUS

## 2023-05-18 MED ORDER — PREDNISONE 20 MG PO TABS: 60.0000 mg | ORAL_TABLET | Freq: Every day | ORAL | 0 refills | Status: AC

## 2023-05-18 MED ORDER — PREDNISONE 50 MG PO TABS
60.0000 mg | ORAL_TABLET | Freq: Once | ORAL | Status: AC
  Administered 2023-05-18: 60 mg via ORAL
  Filled 2023-05-18: qty 1

## 2023-05-18 NOTE — ED Provider Notes (Signed)
[place] EMERGENCY DEPARTMENT AT [facility] Provider Note   CSN: [id number] Arrival date & time: 05/18/23  1933     History  Chief Complaint  Patient presents with   Chest Pain   Shortness of Breath    Diane [name] is a 67 y.o. female.  Patient here with chest pain for the last 4 to 5 hours.  Worse when Diane [name] takes deep breath.  History of pulmonary fibrosis on chronic 4 L of oxygen.  Diane [name] has a history of lung cancer undergoing radiation treatments as Diane [name] has new nodule in her lungs.  Shortness of breath started yesterday when using inhaler.  Does not feel much shortness of breath currently.  Denies any weakness or numbness or tingling.  No cough or sputum production.  History of COPD seizures anxiety hypertension otherwise.  No cardiac history otherwise.  The history is provided by the patient.       Home Medications Prior to Admission medications   Medication Sig Start Date End Date Taking? Authorizing Provider  predniSONE (DELTASONE) 20 MG tablet Take 3 tablets (60 mg total) by mouth daily for 4 days. 05/18/23 05/22/23 Yes , , DO  albuterol (PROVENTIL) (5 MG/ML) 0.5% nebulizer solution Take 2.5 mg by nebulization every 6 (six) hours as needed for wheezing or shortness of breath.    [provider]  albuterol (VENTOLIN HFA) 108 (90 Base) MCG/ACT inhaler Inhale 2 puffs into the lungs every 6 (six) hours as needed for wheezing or shortness of breath. 02/06/23   Bevelyn Ngo, NP  ALPRAZolam Prudy Feeler) 0.5 MG tablet Take 0.5 mg by mouth daily as needed. 01/03/22   [provider]  Budeson-Glycopyrrol-Formoterol (BREZTRI AEROSPHERE) 160-9-4.8 MCG/ACT AERO Inhale 2 puffs into the lungs in the morning and at bedtime. Patient not taking: Reported on 04/13/2023 02/06/23   Bevelyn Ngo, NP  cetirizine (ZYRTEC) 10 MG tablet Take 10 mg by mouth at bedtime.    [provider]  EPINEPHrine (AUVI-Q) 0.3 mg/0.3 mL IJ SOAJ injection Inject 0.3 mLs  (0.3 mg total) into the muscle as needed for anaphylaxis. 04/29/19   Bobbitt, Heywood Iles, MD  fluticasone (FLONASE) 50 MCG/ACT nasal spray Place 2 sprays into both nostrils daily. Patient taking differently: Place 2 sprays into both nostrils daily as needed for allergies. 02/18/20   Gwenlyn Fudge, FNP  HYDROcodone bit-homatropine (HYCODAN) 5-1.5 MG/5ML syrup TAKE 1 TEASPOONFUL BY MOUTH EVERY 6 HOURS AS NEEDED FOR COUGH 04/17/23   Kalman Shan, MD  omeprazole (PRILOSEC) 20 MG capsule Take 1 capsule (20 mg total) by mouth 2 (two) times daily. (Needs to be seen before next refill) 11/15/20   Gwenlyn Fudge, FNP  OVER THE COUNTER MEDICATION Take 1 capsule by mouth daily. Probiotic    [provider]  Pirfenidone (ESBRIET) 267 MG TABS Take 3 tablets (801 mg total) by mouth 3 (three) times daily with meals. (Account: [email or web address]) 01/15/23   Kalman Shan, MD  rosuvastatin (CRESTOR) 10 MG tablet Take 10 mg by mouth at bedtime. 12/05/21   [provider]  umeclidinium-vilanterol (ANORO ELLIPTA) 62.5-25 MCG/ACT AEPB Inhale 1 puff into the lungs daily. 07/14/22   Bevelyn Ngo, NP      Allergies    Alpha-gal, Other, and Penicillins    Review of Systems   Review of Systems  Physical Exam Updated Vital Signs BP (!) 106/93 (BP Location: Right Arm)   Pulse 90   Temp 98.4 F (36.9 C)   Resp 18   Ht  5\' 6"  (1.676 m)   Wt 59.9 kg   SpO2 100%   BMI 21.31 kg/m  Physical Exam Vitals and nursing note reviewed.  Constitutional:      General: Diane [name] is not in acute distress.    Appearance: Diane [name] is well-developed. Diane [name] is not ill-appearing.  HENT:     Head: Normocephalic and atraumatic.  Eyes:     Extraocular Movements: Extraocular movements intact.     Conjunctiva/sclera: Conjunctivae normal.     Pupils: Pupils are equal, round, and reactive to light.  Cardiovascular:     Rate and Rhythm: Normal rate and regular rhythm.     Pulses:          Radial pulses are  2+ on the right side and 2+ on the left side.     Heart sounds: Normal heart sounds. No murmur heard. Pulmonary:     Effort: Pulmonary effort is normal. No respiratory distress.     Breath sounds: Normal breath sounds. No decreased breath sounds or wheezing.  Abdominal:     Palpations: Abdomen is soft.     Tenderness: There is no abdominal tenderness.  Musculoskeletal:        General: No swelling. Normal range of motion.     Cervical back: Normal range of motion and neck supple.     Right lower leg: No edema.     Left lower leg: No edema.  Skin:    General: Skin is warm and dry.     Capillary Refill: Capillary refill takes less than 2 seconds.  Neurological:     Mental Status: Diane [name] is alert.  Psychiatric:        Mood and Affect: Mood normal.     ED Results / Procedures / Treatments   Labs (all labs ordered are listed, but only abnormal results are displayed) Labs Reviewed  BASIC METABOLIC PANEL - Abnormal; Notable for the following components:      Result Value   Glucose, Bld 100 (*)    All other components within normal limits  CBC - Abnormal; Notable for the following components:   RBC 3.77 (*)    Hemoglobin 11.3 (*)    HCT 34.5 (*)    All other components within normal limits  RESP PANEL BY RT-PCR (RSV, FLU A&B, COVID)  [id number]  TROPONIN I (HIGH SENSITIVITY)    EKG EKG Interpretation Date/Time:  Friday May 18 2023 19:45:49 EDT Ventricular Rate:  97 PR Interval:  144 QRS Duration:  66 QT Interval:  368 QTC Calculation: 467 R Axis:   61  Text Interpretation: Normal sinus rhythm When compared with ECG of 20-Dec-2021 09:20, No significant change was found Confirmed by Virgina Norfolk [phone]) on 05/18/2023 8:03:51 PM  Radiology CT Angio Chest PE W and/or Wo Contrast  Result Date: 05/18/2023 CLINICAL DATA:  Pulmonary embolism (PE) suspected, high prob EXAM: CT ANGIOGRAPHY CHEST WITH CONTRAST TECHNIQUE: Multidetector CT imaging of the chest was performed using the  standard protocol during bolus administration of intravenous contrast. Multiplanar CT image reconstructions and MIPs were obtained to evaluate the vascular anatomy. RADIATION DOSE REDUCTION: This exam was performed according to the departmental dose-optimization program which includes automated exposure control, adjustment of the mA and/or kV according to patient size and/or use of iterative reconstruction technique. CONTRAST:  60mL OMNIPAQUE IOHEXOL 350 MG/ML SOLN COMPARISON:  CT angiography chest 03/26/2023, PET CT 04/05/2023 FINDINGS: Cardiovascular: Satisfactory opacification of the pulmonary arteries to the segmental level. No evidence of pulmonary embolism. Normal heart size. No  significant pericardial effusion. The thoracic aorta is normal in caliber. Moderate atherosclerotic plaque of the thoracic aorta. Four-vessel coronary artery calcifications. Mediastinum/Nodes: No enlarged mediastinal, hilar, or axillary lymph nodes. Thyroid gland, trachea, and esophagus demonstrate no significant findings. Lungs/Pleura: Redemonstration of honeycombing, peripheral peripheral reticulations, emphysematous changes. Stable 1.9 x 1.8 cm right lower lobe pulmonary nodule (6:89). Persistent right upper lobe and lower lobe paramediastinal/peribronchovascular airspace opacity and bronchiectasis consistent with known radiation changes. No pleural effusion. No pneumothorax. Upper Abdomen: Status post cholecystectomy. Musculoskeletal: No chest wall abnormality. No suspicious lytic or blastic osseous lesions. No acute displaced fracture. Review of the MIP images confirms the above findings. IMPRESSION: 1. No pulmonary embolus. 2. Stable 1.9 x 1.8 cm right lower lobe nodule better evaluated on PET CT 04/05/2023 and consistent with recurrent malignancy. 3. Persistent right upper lobe and lower lobe pradiation changes. 4. Chronic pulmonary fibrosis suggestive of UIP. 5. Aortic Atherosclerosis (ICD10-I70.0) and Emphysema (ICD10-J43.9).  Electronically Signed   By: Tish Frederickson M.D.   On: 05/18/2023 21:19   DG Chest Port 1 View  Result Date: 05/18/2023 CLINICAL DATA:  Chest pain and shortness of breath. History of pulmonary fibrosis and lung cancer. EXAM: PORTABLE CHEST 1 VIEW COMPARISON:  03/22/2023, 04/05/2023. FINDINGS: The heart is enlarged and mediastinal contours are within normal limits. Stable interstitial prominence is noted in the lungs bilaterally. An irregular bandlike opacity is noted in the mid right lung and mild airspace opacity is present at the right lung base. No effusion or pneumothorax. No acute osseous abnormality. Surgical clips are present in the right upper quadrant. IMPRESSION: 1. No acute cardiopulmonary disease. 2. Findings compatible with pulmonary fibrosis. 3. Irregular bandlike opacity in the mid right lung, likely associated with prior radiation change. 4. Mild airspace disease at the right lung base obscuring known right lower lobe pulmonary nodule seen on recent PET-CT. Electronically Signed   By: Thornell Sartorius M.D.   On: 05/18/2023 20:23    Procedures Procedures    Medications Ordered in ED Medications  iohexol (OMNIPAQUE) 350 MG/ML injection 100 mL (60 mLs Intravenous Contrast Given 05/18/23 2047)  predniSONE (DELTASONE) tablet 60 mg (60 mg Oral Given 05/18/23 2148)    ED Course/ Medical Decision Making/ A&P                                 Medical Decision Making Amount and/or Complexity of Data Reviewed Labs: ordered. Radiology: ordered.  Risk Prescription drug management.   Laiklynn Tangonan is here with pain with inspiration.  Normal vitals.  No fever.  History of lung cancer undergoing radiation treatment recently for new pulmonary nodule.  Diane [name] has a history of pulmonary fibrosis on 4 L of oxygen.  No cardiac history.  EKG shows sinus rhythm.  No ischemic changes.  Pain now for the last 6 to 7 hours upon my evaluation.  Worse when Diane [name] takes a deep breath.  EKG shows sinus rhythm.   No ischemic changes.  Differential diagnosis could be allergic pain from inflammation/COPD versus PE versus ACS versus pneumonia.  I do not have any other concern for pneumothorax at this time given clear breath sounds.  Will get CBC BMP troponin chest x-ray CT chest.  Per my review interpretation labs no significant anemia or electrolyte abnormality or kidney injury or leukocytosis.  Troponin normal chest x-ray unremarkable.  CT scan per radiology report with no PE.  Stable pulmonary nodule consistent with metastasis.  Otherwise there were no acute findings on the CT scan.  COVID and flu test to be obtained.  Could be a viral process.  Could be pleuritic type process/inflammatory process.  Will put her on prednisone for few days.  Have no concern for ACS or other emergent process at this time.  Diane [name] understands return precautions.  Discharged in good condition.  This chart was dictated using voice recognition software.  Despite best efforts to proofread,  errors can occur which can change the documentation meaning.         Final Clinical Impression(s) / ED Diagnoses Final diagnoses:  Pleurisy    Rx / DC Orders ED Discharge Orders          Ordered    predniSONE (DELTASONE) 20 MG tablet  Daily        05/18/23 2132              Virgina Norfolk, DO 05/18/23 2154

## 2023-05-18 NOTE — ED Triage Notes (Signed)
Pt to ED c/o chest pain x 3 hours, worse with deep breath. Home o2 user @4L . Hx pulmonary fibrosis, lung cancer- not currently undergoing treatment, last treatment July 23. Reports SHOB that started yesterday

## 2023-05-18 NOTE — Discharge Instructions (Signed)
Take next dose of steroid tomorrow.  Follow-up with your primary care doctor.  Please return if symptoms worsen.

## 2023-05-23 ENCOUNTER — Other Ambulatory Visit: Payer: Self-pay | Admitting: Internal Medicine

## 2023-05-25 ENCOUNTER — Telehealth: Payer: Self-pay | Admitting: Internal Medicine

## 2023-05-25 NOTE — Telephone Encounter (Signed)
Patient states needs refill for cough syrup. Pharmacy is [facility]. Patient phone number is [phone].

## 2023-05-25 NOTE — Telephone Encounter (Signed)
Called and spoke to patient.  She is requesting refill on Hycodan 5-1.5mg . last refilled 04/17/2023 #240.  C/o persistent dry cough and SOB.   MR, please advise. Thanks

## 2023-05-27 ENCOUNTER — Encounter: Payer: Self-pay | Admitting: Internal Medicine

## 2023-05-28 MED ORDER — HYDROCODONE BIT-HOMATROP MBR 5-1.5 MG/5ML PO SOLN: 5.0000 mL | Freq: Four times a day (QID) | ORAL | 0 refills | Status: DC | PRN

## 2023-05-28 NOTE — Telephone Encounter (Signed)
Ok done and sent to pharmacy. LAst week I told triage to do it and send it to me for finger print singature. I did not see that and so I did it now and has gone to pharmacy. Hopefully it is correct. IF not, please LMK

## 2023-05-28 NOTE — Telephone Encounter (Signed)
Pls send script and I wil fingerprint sign today. LMK

## 2023-05-29 NOTE — Progress Notes (Signed)
Ok

## 2023-07-03 ENCOUNTER — Other Ambulatory Visit: Payer: Self-pay | Admitting: Internal Medicine

## 2023-07-16 ENCOUNTER — Ambulatory Visit
Admission: RE | Admit: 2023-07-16 | Discharge: 2023-07-16 | Disposition: A | Discharge status: Home / Self Care | Payer: Medicare Other | Source: Ambulatory Visit | Attending: Hematology and Oncology | Admitting: Hematology and Oncology

## 2023-07-16 NOTE — Progress Notes (Signed)
Radiation Oncology         [phone]) [phone] ________________________________  Name: Diane [name] MRN: [id number]  Date of Service: 07/16/2023  DOB: 09-29-1956  Post Treatment Telephone Note  Diagnosis:  Putative Stage IA3, cT1cN0M0, NSCLC of the RLL with history of Stage IIB, cT2aN1M0, NSCLC, Squamous Cell Carcinoma of the RLL.Interstitial lung disease with COPD/emphysema, O2 dependant. (as documented in provider EOT note)   The patient was available for call today.   Symptoms of fatigue have improved since completing therapy.  Symptoms of skin changes have improved since completing therapy.  Symptoms of esophagitis have improved since completing therapy.   The patient has scheduled follow up with her medical oncologist Dr. Arbutus Ped for ongoing care, and was encouraged to call if she develops concerns or questions regarding radiation.   This concludes the interaction.  Ruel Favors, LPN

## 2023-07-17 ENCOUNTER — Ambulatory Visit: Payer: Medicare Other | Admitting: Internal Medicine

## 2023-07-31 ENCOUNTER — Inpatient Hospital Stay: Payer: Medicare Other | Attending: Internal Medicine

## 2023-07-31 ENCOUNTER — Ambulatory Visit (HOSPITAL_COMMUNITY)
Admission: RE | Admit: 2023-07-31 | Discharge: 2023-07-31 | Disposition: A | Discharge status: Home / Self Care | Payer: Medicare Other | Source: Ambulatory Visit | Attending: Physician Assistant

## 2023-07-31 ENCOUNTER — Encounter (HOSPITAL_COMMUNITY): Payer: Self-pay

## 2023-07-31 DIAGNOSIS — J849 Interstitial pulmonary disease, unspecified: Secondary | ICD-10-CM | POA: Diagnosis not present

## 2023-07-31 DIAGNOSIS — R0602 Shortness of breath: Secondary | ICD-10-CM | POA: Diagnosis not present

## 2023-07-31 DIAGNOSIS — R911 Solitary pulmonary nodule: Secondary | ICD-10-CM | POA: Insufficient documentation

## 2023-07-31 DIAGNOSIS — Z79899 Other long term (current) drug therapy: Secondary | ICD-10-CM | POA: Diagnosis not present

## 2023-07-31 DIAGNOSIS — C3431 Malignant neoplasm of lower lobe, right bronchus or lung: Secondary | ICD-10-CM | POA: Diagnosis present

## 2023-07-31 DIAGNOSIS — Z923 Personal history of irradiation: Secondary | ICD-10-CM | POA: Insufficient documentation

## 2023-07-31 DIAGNOSIS — C3491 Malignant neoplasm of unspecified part of right bronchus or lung: Secondary | ICD-10-CM | POA: Insufficient documentation

## 2023-07-31 DIAGNOSIS — C3490 Malignant neoplasm of unspecified part of unspecified bronchus or lung: Secondary | ICD-10-CM

## 2023-07-31 DIAGNOSIS — Z87891 Personal history of nicotine dependence: Secondary | ICD-10-CM | POA: Insufficient documentation

## 2023-07-31 DIAGNOSIS — Z9221 Personal history of antineoplastic chemotherapy: Secondary | ICD-10-CM | POA: Diagnosis not present

## 2023-07-31 DIAGNOSIS — R5383 Other fatigue: Secondary | ICD-10-CM | POA: Insufficient documentation

## 2023-07-31 DIAGNOSIS — J439 Emphysema, unspecified: Secondary | ICD-10-CM | POA: Insufficient documentation

## 2023-07-31 LAB — CMP (CANCER CENTER ONLY)
ALT: 16 U/L (ref 0–44)
AST: 22 U/L (ref 15–41)
Albumin: 4.3 g/dL (ref 3.5–5.0)
Alkaline Phosphatase: 94 U/L (ref 38–126)
Anion gap: 7 (ref 5–15)
BUN: 12 mg/dL (ref 8–23)
CO2: 29 mmol/L (ref 22–32)
Calcium: 10 mg/dL (ref 8.9–10.3)
Chloride: 101 mmol/L (ref 98–111)
Creatinine: 0.81 mg/dL (ref 0.44–1.00)
GFR, Estimated: >60 mL/min (ref >60)
Glucose, Bld: 80 mg/dL (ref 70–99)
Potassium: 4.2 mmol/L (ref 3.5–5.1)
Sodium: 137 mmol/L (ref 135–145)
Total Bilirubin: 0.3 mg/dL (ref 0.3–1.2)
Total Protein: 8.8 g/dL — ABNORMAL HIGH (ref 6.5–8.1)

## 2023-07-31 LAB — CBC WITH DIFFERENTIAL (CANCER CENTER ONLY)
Abs Immature Granulocytes: 0.04 10*3/uL (ref 0.00–0.07)
Basophils Absolute: 0 10*3/uL (ref 0.0–0.1)
Basophils Relative: 0 %
Eosinophils Absolute: 0.3 10*3/uL (ref 0.0–0.5)
Eosinophils Relative: 5 %
HCT: 35.5 % — ABNORMAL LOW (ref 36.0–46.0)
Hemoglobin: 11.6 g/dL — ABNORMAL LOW (ref 12.0–15.0)
Immature Granulocytes: 1 %
Lymphocytes Relative: 16 %
Lymphs Abs: 1 10*3/uL (ref 0.7–4.0)
MCH: 30.9 pg (ref 26.0–34.0)
MCHC: 32.7 g/dL (ref 30.0–36.0)
MCV: 94.4 fL (ref 80.0–100.0)
Monocytes Absolute: 0.5 10*3/uL (ref 0.1–1.0)
Monocytes Relative: 9 %
Neutro Abs: 4.1 10*3/uL (ref 1.7–7.7)
Neutrophils Relative %: 69 %
Platelet Count: 277 10*3/uL (ref 150–400)
RBC: 3.76 MIL/uL — ABNORMAL LOW (ref 3.87–5.11)
RDW: 12.4 % (ref 11.5–15.5)
WBC Count: 6 10*3/uL (ref 4.0–10.5)
nRBC: 0 % (ref 0.0–0.2)

## 2023-07-31 MED ORDER — IOHEXOL 300 MG/ML  SOLN
75.0000 mL | Freq: Once | INTRAMUSCULAR | Status: AC | PRN
  Administered 2023-07-31: 75 mL via INTRAVENOUS

## 2023-07-31 MED ORDER — SODIUM CHLORIDE (PF) 0.9 % IJ SOLN
INTRAMUSCULAR | Status: AC
  Filled 2023-07-31: qty 50

## 2023-08-07 ENCOUNTER — Encounter: Payer: Self-pay | Admitting: Internal Medicine

## 2023-08-07 ENCOUNTER — Inpatient Hospital Stay (HOSPITAL_BASED_OUTPATIENT_CLINIC_OR_DEPARTMENT_OTHER): Payer: Medicare Other | Admitting: Internal Medicine

## 2023-08-07 ENCOUNTER — Ambulatory Visit (INDEPENDENT_AMBULATORY_CARE_PROVIDER_SITE_OTHER): Payer: Medicare Other | Admitting: Internal Medicine

## 2023-08-07 VITALS — BP 132/62 | HR 87 | Temp 97.7°F | Resp 18 | Ht 65.0 in | Wt 131.0 lb

## 2023-08-07 VITALS — BP 156/88 | HR 73 | Ht 65.0 in | Wt 130.8 lb

## 2023-08-07 DIAGNOSIS — Z23 Encounter for immunization: Secondary | ICD-10-CM

## 2023-08-07 DIAGNOSIS — C3490 Malignant neoplasm of unspecified part of unspecified bronchus or lung: Secondary | ICD-10-CM | POA: Diagnosis not present

## 2023-08-07 DIAGNOSIS — J84112 Idiopathic pulmonary fibrosis: Secondary | ICD-10-CM

## 2023-08-07 DIAGNOSIS — J439 Emphysema, unspecified: Secondary | ICD-10-CM

## 2023-08-07 DIAGNOSIS — J9611 Chronic respiratory failure with hypoxia: Secondary | ICD-10-CM

## 2023-08-07 DIAGNOSIS — C3431 Malignant neoplasm of lower lobe, right bronchus or lung: Secondary | ICD-10-CM | POA: Diagnosis not present

## 2023-08-07 MED ORDER — HYDROCODONE BIT-HOMATROP MBR 5-1.5 MG/5ML PO SOLN: 5.0000 mL | Freq: Four times a day (QID) | ORAL | 0 refills | Status: DC | PRN

## 2023-08-07 NOTE — Progress Notes (Signed)
from the next room.  But she states her pulse ox remained stable.  Correlating with all this is the pulmonary function test to significantly decline.  Also CT scan of the chest in April 2024 that I personally visualized is significantly worse.  Dr. Arbutus Ped is doing another CT scan in July 2024.  Today she went to her primary care physician at [facility] but I do not have the results she showed me lipid results but she believes the liver function test was normal.  She says that she has been given a life expectancy of less than 1 year she is upset about this.  But she says she is also realistic.  She says she has advanced directives she is working on it.  She is agreed to be a DNR.  Symptom wise significant cough is there but dyspnea is even worse.  She is willing to get Hycodan to help both symptoms  Overall with her quality of life and decline while she is looking for the reversible and looking to remain as healthy and as alive as possible she is also willing to be seen by palliative care at home.  We did a goals of care discussion about all this.  Filled out [employer] emergency backup electricity form.  Also filled out handicap placard form.    OV 04/13/2023  Subjective:  Patient ID: Diane [name], female , DOB: 23-Sep-1956 , age 67 y.o. , MRN: [id number] , ADDRESS: [address] [place] [address] PCP Ignatius Specking, MD Patient Care Team: Ignatius Specking, MD as PCP - General (Internal Medicine) Jena Gauss Gerrit Friends, MD as Consulting Physician (Gastroenterology) Janalyn Harder, MD (Inactive) as Consulting Physician (Dermatology)  This Provider for this visit: Treatment Team:  Attending Provider: Kalman Shan, MD  Type of visit: Video Virtual Visit Identification of patient Diane [name] with 19-Mar-1956 and MRN [id number] - 2 person identifier Risks: Risks, benefits, limitations of telephone visit explained. Patient understood and verbalized agreement to proceed Anyone else on call: husband Patient location: her home This provider location: [address], [address]; [place]; [place] [address]. [facility]. [phone]    04/13/2023 -   Chief Complaint  Patient presents with   Follow-up    F/up discuss intervenous steroids and discuss some other medical issues.     HPI Diane [name] 67 y.o. - preents with husband on video visit . Has been betting worse. . IN interim, ABG without hypercapnia. CTA ruled out PE but showed worsning RLL nodule. Has known post XRT consolidation in RLL. No ILD flare up. Saw onc and rad onc. Notes reviewed. Advised SBRT mid uly x 5 sessions. She is worriued about ILD flare with XRT. But she is on pirfenidone. Wants me clearance pripr to SBRT and also if neeed to do high dose steroids    CTA 6/10;/24  Narrative & Impression  CLINICAL DATA:  Worsening shortness of breath and chest discomfort 3 weeks. Elevated D-dimer. Lung carcinoma. Undergoing chemotherapy. * Tracking Code: BO *   EXAM: CT ANGIOGRAPHY CHEST WITH CONTRAST   TECHNIQUE: Multidetector CT imaging of the  chest was performed using the standard protocol during bolus administration of intravenous contrast. Multiplanar CT image reconstructions and MIPs were obtained to evaluate the vascular anatomy.   RADIATION DOSE REDUCTION: This exam was performed according to the departmental dose-optimization program which includes automated exposure control, adjustment of the mA and/or kV according to patient size and/or use of iterative reconstruction technique.   CONTRAST:  CT ordered for 3 months which will be July 2024. She knows to call immediately for any concerning symptoms or changes in her breathing.   We are doing a therapeutic trial with Breztri. She is currently on Anoro which she states does not seem to help with her breathing. We discussed that there is a steroid component to the Humptulips which can increase risk of pneumonia. She knows to stop using inhaler and call to be seen if she has any changes in her breathing status. I have also prescribed an albuterol inhaler as she does have episodes  of what sounds like upper airway wheezing.   We need to continue to monitor for weight loss. On the Esbriet 3 tabs TID she lost a significant amount of weight. We will re-evaluate her weight upon follow up with Dr. Marchelle Gearing in 6 weeks on the 2 tablets TID.  She will need repeat LFT's in 4-6 weeks , She sees Dr. Elly Modena 03/22/2023.      Test Results: CT Chest with contrast 01/27/2023 Slight increase in focal opacity involving the superior segment right lower lobe with increasing pleural thickening and loculated small pleural fluid.   The previously seen hypermetabolic nodules in the right upper lobe and lower lobe separately are again noted today in our appear relatively similar when adjusting for technique.   Persistent soft tissue fullness of the right lung hilum as well as some prominent nodes in the mediastinum and left hilum.   Underlying advanced emphysematous lung changes.   Aortic Atherosclerosis (ICD10-I70.0) and Emphysema (ICD10-J43.9).   OV 03/22/2023  Subjective:  Patient ID: Diane [name], female , DOB: 04-Dec-1955 , age 46 y.o. , MRN: [id number] , ADDRESS: [address] [place] [address] PCP Ignatius Specking, MD Patient Care Team: Ignatius Specking, MD as PCP - General (Internal Medicine) Jena Gauss, Gerrit Friends, MD as Consulting Physician (Gastroenterology) Janalyn Harder, MD (Inactive) as Consulting Physician (Dermatology)  This Provider for this visit: Treatment Team:  Attending Provider: Kalman Shan, MD  #IPF/UIP pattern #Associated pulmonary emphysema #Combined pulmonary fibrosis emphysema  -Chronic green sputum #Lung cancer #DNR , ECOG 3-4, Class 4 dyspnea  03/22/2023 -   Chief Complaint  Patient presents with   Follow-up    PFT:03/22/23 O2 4l     HPI Diane [name] 67 y.o. -returns for follow-up.  Since her last visit her symptoms have deteriorated.  Pulmonary function test also shows a steep decline in both FVC and particularly with  DLCO.  She tells me that ever since January 2024 she is having a steady decline but more so in the last 1 month.  And send the last 1 month her shower and getting dressed time has gotten worse.  She used to be able to get ready within 30 minutes now she is requiring 1 hour.  However at pulmonary rehabilitation the max oxygen she is required has been 8-10 L [4 L at rest] but 2 days ago abruptly she required 15 L and I was alerted about it.  She is denying any hemoptysis or fever or chills.  She has chronic green sputum but this is not change.  There is no worsening of cough there is no edema.  She is not on any anticoagulation.  In addition she is also noticing abrupt spasms of breathing for the last 1 month is almost a stridorous noise.  She was sucking air and it is loud it happens spontaneously.  Happens around 20 times a day.  Her husband comes rushing  75mL OMNIPAQUE IOHEXOL 350 MG/ML SOLN   COMPARISON:  01/25/2023   FINDINGS: Cardiovascular: Satisfactory opacification of pulmonary arteries noted, and no pulmonary emboli identified. No evidence of thoracic aortic dissection or aneurysm. Aortic and coronary atherosclerotic calcification incidentally noted.   Mediastinum/Nodes: No masses or pathologically enlarged lymph nodes identified.   Lungs/Pleura: Centrilobular and paraseptal emphysema again noted. Chronic changes of subpleural fibrosis and honeycombing again seen with bibasilar predominance. Areas of confluent opacity and central bronchiectasis are again seen in the right upper and lower lobes, without significant change. A poorly defined nodular opacity in the posterior right lower lobe on image 95/5 currently measures 1.9 x 1.8 cm, compared to 1.2 x 1.0 cm previously. No other new or enlarging sites of pulmonary opacity identified. No evidence of pleural effusion.   Upper abdomen: No acute findings.   Musculoskeletal: No suspicious bone lesions identified.   Review of the MIP images confirms the above findings.   IMPRESSION: No evidence of pulmonary embolism.   Increased size of 1.9 cm nodular opacity in posterior right lower lobe, suspicious for carcinoma.   No significant change in areas of right lung airspace disease and bronchiectasis, likely due to post radiation changes.   Chronic pulmonary emphysema and fibrosis.   Aortic Atherosclerosis  (ICD10-I70.0) and Emphysema (ICD10-J43.9).     Electronically Signed   By: Danae Orleans M.D.   On: 03/26/2023 16:10      NM PET scan 04/05/23  Narrative & Impression  CLINICAL DATA:  Subsequent treatment strategy for lung carcinoma. Chemotherapy ongoing. Concern for increasing RIGHT lower lobe nodule.   EXAM: NUCLEAR MEDICINE PET SKULL BASE TO THIGH   TECHNIQUE: 7.47 mCi F-18 FDG was injected intravenously. Full-ring PET imaging was performed from the skull base to thigh after the radiotracer. CT data was obtained and used for attenuation correction and anatomic localization.   Fasting blood glucose: 110 mg/dl   COMPARISON:  CT 08/65/7846 com PET-CT 10/04/2022   FINDINGS: Mediastinal blood pool activity: SUV max 2.0   Liver activity: SUV max NA   NECK: No hypermetabolic lymph nodes in the neck.   Incidental CT findings: None.   CHEST: Rounded focus of intense metabolic activity in the RIGHT lower lobe measures 2.7 cm with SUV max equal 15.4. This corresponds to nodule of concern on comparison CT.   Band of parenchymal consolidation in the more superior RIGHT lower lobe has mild metabolic activity favored post radiation change. Band parenchymal consolidation in the RIGHT upper lobe is also similar mild metabolic activity.   No hypermetabolic mediastinal lymph nodes.   Incidental CT findings: None.   ABDOMEN/PELVIS: No abnormal hypermetabolic activity within the liver, pancreas, adrenal glands, or spleen. No hypermetabolic lymph nodes in the abdomen or pelvis.   Incidental CT findings: None.   SKELETON: No focal hypermetabolic activity to suggest skeletal metastasis.   Incidental CT findings: None.   IMPRESSION: 1. Persistent pulmonary nodule in the inferior RIGHT lower lobe with intense metabolic activity is concerning for lung cancer recurrence. 2. Consolidation in the superior aspect of the RIGHT lower lobe and RIGHT upper lobe is similar to prior and  most consistent with post radiation change. 3. No hypermetabolic mediastinal lymph nodes. 4. No distant metastatic disease.     Electronically Signed   By: Genevive Bi M.D.   On: 04/09/2023 10:22     OV 08/07/2023  Subjective:  Patient ID: Diane [name], female , DOB: 1956-02-17 , age 89 y.o. , MRN: [id number] , ADDRESS: [address]  CT ordered for 3 months which will be July 2024. She knows to call immediately for any concerning symptoms or changes in her breathing.   We are doing a therapeutic trial with Breztri. She is currently on Anoro which she states does not seem to help with her breathing. We discussed that there is a steroid component to the Humptulips which can increase risk of pneumonia. She knows to stop using inhaler and call to be seen if she has any changes in her breathing status. I have also prescribed an albuterol inhaler as she does have episodes  of what sounds like upper airway wheezing.   We need to continue to monitor for weight loss. On the Esbriet 3 tabs TID she lost a significant amount of weight. We will re-evaluate her weight upon follow up with Dr. Marchelle Gearing in 6 weeks on the 2 tablets TID.  She will need repeat LFT's in 4-6 weeks , She sees Dr. Elly Modena 03/22/2023.      Test Results: CT Chest with contrast 01/27/2023 Slight increase in focal opacity involving the superior segment right lower lobe with increasing pleural thickening and loculated small pleural fluid.   The previously seen hypermetabolic nodules in the right upper lobe and lower lobe separately are again noted today in our appear relatively similar when adjusting for technique.   Persistent soft tissue fullness of the right lung hilum as well as some prominent nodes in the mediastinum and left hilum.   Underlying advanced emphysematous lung changes.   Aortic Atherosclerosis (ICD10-I70.0) and Emphysema (ICD10-J43.9).   OV 03/22/2023  Subjective:  Patient ID: Diane [name], female , DOB: 04-Dec-1955 , age 46 y.o. , MRN: [id number] , ADDRESS: [address] [place] [address] PCP Ignatius Specking, MD Patient Care Team: Ignatius Specking, MD as PCP - General (Internal Medicine) Jena Gauss, Gerrit Friends, MD as Consulting Physician (Gastroenterology) Janalyn Harder, MD (Inactive) as Consulting Physician (Dermatology)  This Provider for this visit: Treatment Team:  Attending Provider: Kalman Shan, MD  #IPF/UIP pattern #Associated pulmonary emphysema #Combined pulmonary fibrosis emphysema  -Chronic green sputum #Lung cancer #DNR , ECOG 3-4, Class 4 dyspnea  03/22/2023 -   Chief Complaint  Patient presents with   Follow-up    PFT:03/22/23 O2 4l     HPI Diane [name] 67 y.o. -returns for follow-up.  Since her last visit her symptoms have deteriorated.  Pulmonary function test also shows a steep decline in both FVC and particularly with  DLCO.  She tells me that ever since January 2024 she is having a steady decline but more so in the last 1 month.  And send the last 1 month her shower and getting dressed time has gotten worse.  She used to be able to get ready within 30 minutes now she is requiring 1 hour.  However at pulmonary rehabilitation the max oxygen she is required has been 8-10 L [4 L at rest] but 2 days ago abruptly she required 15 L and I was alerted about it.  She is denying any hemoptysis or fever or chills.  She has chronic green sputum but this is not change.  There is no worsening of cough there is no edema.  She is not on any anticoagulation.  In addition she is also noticing abrupt spasms of breathing for the last 1 month is almost a stridorous noise.  She was sucking air and it is loud it happens spontaneously.  Happens around 20 times a day.  Her husband comes rushing  CT ordered for 3 months which will be July 2024. She knows to call immediately for any concerning symptoms or changes in her breathing.   We are doing a therapeutic trial with Breztri. She is currently on Anoro which she states does not seem to help with her breathing. We discussed that there is a steroid component to the Humptulips which can increase risk of pneumonia. She knows to stop using inhaler and call to be seen if she has any changes in her breathing status. I have also prescribed an albuterol inhaler as she does have episodes  of what sounds like upper airway wheezing.   We need to continue to monitor for weight loss. On the Esbriet 3 tabs TID she lost a significant amount of weight. We will re-evaluate her weight upon follow up with Dr. Marchelle Gearing in 6 weeks on the 2 tablets TID.  She will need repeat LFT's in 4-6 weeks , She sees Dr. Elly Modena 03/22/2023.      Test Results: CT Chest with contrast 01/27/2023 Slight increase in focal opacity involving the superior segment right lower lobe with increasing pleural thickening and loculated small pleural fluid.   The previously seen hypermetabolic nodules in the right upper lobe and lower lobe separately are again noted today in our appear relatively similar when adjusting for technique.   Persistent soft tissue fullness of the right lung hilum as well as some prominent nodes in the mediastinum and left hilum.   Underlying advanced emphysematous lung changes.   Aortic Atherosclerosis (ICD10-I70.0) and Emphysema (ICD10-J43.9).   OV 03/22/2023  Subjective:  Patient ID: Diane [name], female , DOB: 04-Dec-1955 , age 46 y.o. , MRN: [id number] , ADDRESS: [address] [place] [address] PCP Ignatius Specking, MD Patient Care Team: Ignatius Specking, MD as PCP - General (Internal Medicine) Jena Gauss, Gerrit Friends, MD as Consulting Physician (Gastroenterology) Janalyn Harder, MD (Inactive) as Consulting Physician (Dermatology)  This Provider for this visit: Treatment Team:  Attending Provider: Kalman Shan, MD  #IPF/UIP pattern #Associated pulmonary emphysema #Combined pulmonary fibrosis emphysema  -Chronic green sputum #Lung cancer #DNR , ECOG 3-4, Class 4 dyspnea  03/22/2023 -   Chief Complaint  Patient presents with   Follow-up    PFT:03/22/23 O2 4l     HPI Diane [name] 67 y.o. -returns for follow-up.  Since her last visit her symptoms have deteriorated.  Pulmonary function test also shows a steep decline in both FVC and particularly with  DLCO.  She tells me that ever since January 2024 she is having a steady decline but more so in the last 1 month.  And send the last 1 month her shower and getting dressed time has gotten worse.  She used to be able to get ready within 30 minutes now she is requiring 1 hour.  However at pulmonary rehabilitation the max oxygen she is required has been 8-10 L [4 L at rest] but 2 days ago abruptly she required 15 L and I was alerted about it.  She is denying any hemoptysis or fever or chills.  She has chronic green sputum but this is not change.  There is no worsening of cough there is no edema.  She is not on any anticoagulation.  In addition she is also noticing abrupt spasms of breathing for the last 1 month is almost a stridorous noise.  She was sucking air and it is loud it happens spontaneously.  Happens around 20 times a day.  Her husband comes rushing  OV 11/13/2022 transfer of care to the ILD center with Dr. Marchelle Gearing.  Subjective:  Patient ID: Diane [name], female , DOB: 1956-06-12 , age 60 y.o. , MRN: [id number] , ADDRESS: [address] [place] [address] PCP Ignatius Specking, MD Patient Care Team: Ignatius Specking, MD as PCP - General (Internal Medicine) Jena Gauss Gerrit Friends, MD as Consulting Physician (Gastroenterology) Janalyn Harder, MD (Inactive) as Consulting Physician (Dermatology)  This Provider for this visit: Treatment Team:  Attending Provider: Kalman Shan, MD    11/13/2022 -   Chief Complaint  Patient presents with   New Patient (Initial Visit)    New pt from [facility] for [facility]. Patient filled out packet and for Esbriet but never started the medication. CT is for April 2024. Pt is just getting over PNA. Still has a slight cough. Just finished prednisone and Levaquin.      HPI Norelle Ghazi 67 y.o. -presents with her husband [name].  [name] daughter-in-law Durel Salts with respiratory therapist at [facility].  She retire and then in the spring 2023 was diagnosed with right lower lobe non-small cell cancer stage II.  She is status post radiation and also chemotherapy.  Currently on observation treatment.  This is based on independent history from the husband and the patient and also review of the external records.  She is understandably quite frustrated with the onset of all these health issues given the fact she just retired.  Then approximately in September 2023 started noticing significant amount of dyspnea on exertion walking the dog and climbing stairs.  At this time desaturations with exercise to be noticed.  She has since been using oxygen and this helps her.  She is attended cardiac rehabilitation or pulmonary rehabilitation and this also helped her.  She was then given a diagnosis of pulmonary fibrosis/UIP.  Antifibrotic pirfenidone was started based on her history and also external  record review.  However due to some confusion of the paperwork this has not been started yet.  She is here for evaluation of her ILD.  Visualization of CT scan from 2015 shows presence of possible early ILD even back in 2015 with some honeycombing/emphysema at the base.  There was a craniocaudal gradient back pain.   Also of note she just went to a [place] cruise in mid January 2024.  When she came back on October 28, 2022.  Then on October 30, 2022 she had cough headaches no fever.  Her flu test and COVID was negative.  She is being treated for acute bronchitis with Levaquin and IM steroids and 6-day prednisone taper.  She will she was given a diagnosis of pneumonia actually.  She is almost back to baseline but still feels fatigued and still with some cough worse than her baseline. [place] [facility] Comprehensive ILD Questionnaire  Symptoms:  0 she believes she has had much rapid worsening in the last 6 months only.  Suspect this is after her radiation.  Current symptom scores are as below.   Past Medical History :  -She was told 20 years ago she had hiatal hernia and acid reflux but she says she has had no issues since then.  It is definitely not described in the current CT chest -She is awaiting a sleep study -She had seizures 12 years ago. -She has hypertension controlled on medication -Currently just finished antibiotic and steroid course-has had COVID-vaccine but never had COVID    ROS:  -Tired and does have  OV 11/13/2022 transfer of care to the ILD center with Dr. Marchelle Gearing.  Subjective:  Patient ID: Diane [name], female , DOB: 1956-06-12 , age 60 y.o. , MRN: [id number] , ADDRESS: [address] [place] [address] PCP Ignatius Specking, MD Patient Care Team: Ignatius Specking, MD as PCP - General (Internal Medicine) Jena Gauss Gerrit Friends, MD as Consulting Physician (Gastroenterology) Janalyn Harder, MD (Inactive) as Consulting Physician (Dermatology)  This Provider for this visit: Treatment Team:  Attending Provider: Kalman Shan, MD    11/13/2022 -   Chief Complaint  Patient presents with   New Patient (Initial Visit)    New pt from [facility] for [facility]. Patient filled out packet and for Esbriet but never started the medication. CT is for April 2024. Pt is just getting over PNA. Still has a slight cough. Just finished prednisone and Levaquin.      HPI Norelle Ghazi 67 y.o. -presents with her husband [name].  [name] daughter-in-law Durel Salts with respiratory therapist at [facility].  She retire and then in the spring 2023 was diagnosed with right lower lobe non-small cell cancer stage II.  She is status post radiation and also chemotherapy.  Currently on observation treatment.  This is based on independent history from the husband and the patient and also review of the external records.  She is understandably quite frustrated with the onset of all these health issues given the fact she just retired.  Then approximately in September 2023 started noticing significant amount of dyspnea on exertion walking the dog and climbing stairs.  At this time desaturations with exercise to be noticed.  She has since been using oxygen and this helps her.  She is attended cardiac rehabilitation or pulmonary rehabilitation and this also helped her.  She was then given a diagnosis of pulmonary fibrosis/UIP.  Antifibrotic pirfenidone was started based on her history and also external  record review.  However due to some confusion of the paperwork this has not been started yet.  She is here for evaluation of her ILD.  Visualization of CT scan from 2015 shows presence of possible early ILD even back in 2015 with some honeycombing/emphysema at the base.  There was a craniocaudal gradient back pain.   Also of note she just went to a [place] cruise in mid January 2024.  When she came back on October 28, 2022.  Then on October 30, 2022 she had cough headaches no fever.  Her flu test and COVID was negative.  She is being treated for acute bronchitis with Levaquin and IM steroids and 6-day prednisone taper.  She will she was given a diagnosis of pneumonia actually.  She is almost back to baseline but still feels fatigued and still with some cough worse than her baseline. [place] [facility] Comprehensive ILD Questionnaire  Symptoms:  0 she believes she has had much rapid worsening in the last 6 months only.  Suspect this is after her radiation.  Current symptom scores are as below.   Past Medical History :  -She was told 20 years ago she had hiatal hernia and acid reflux but she says she has had no issues since then.  It is definitely not described in the current CT chest -She is awaiting a sleep study -She had seizures 12 years ago. -She has hypertension controlled on medication -Currently just finished antibiotic and steroid course-has had COVID-vaccine but never had COVID    ROS:  -Tired and does have  CT ordered for 3 months which will be July 2024. She knows to call immediately for any concerning symptoms or changes in her breathing.   We are doing a therapeutic trial with Breztri. She is currently on Anoro which she states does not seem to help with her breathing. We discussed that there is a steroid component to the Humptulips which can increase risk of pneumonia. She knows to stop using inhaler and call to be seen if she has any changes in her breathing status. I have also prescribed an albuterol inhaler as she does have episodes  of what sounds like upper airway wheezing.   We need to continue to monitor for weight loss. On the Esbriet 3 tabs TID she lost a significant amount of weight. We will re-evaluate her weight upon follow up with Dr. Marchelle Gearing in 6 weeks on the 2 tablets TID.  She will need repeat LFT's in 4-6 weeks , She sees Dr. Elly Modena 03/22/2023.      Test Results: CT Chest with contrast 01/27/2023 Slight increase in focal opacity involving the superior segment right lower lobe with increasing pleural thickening and loculated small pleural fluid.   The previously seen hypermetabolic nodules in the right upper lobe and lower lobe separately are again noted today in our appear relatively similar when adjusting for technique.   Persistent soft tissue fullness of the right lung hilum as well as some prominent nodes in the mediastinum and left hilum.   Underlying advanced emphysematous lung changes.   Aortic Atherosclerosis (ICD10-I70.0) and Emphysema (ICD10-J43.9).   OV 03/22/2023  Subjective:  Patient ID: Diane [name], female , DOB: 04-Dec-1955 , age 46 y.o. , MRN: [id number] , ADDRESS: [address] [place] [address] PCP Ignatius Specking, MD Patient Care Team: Ignatius Specking, MD as PCP - General (Internal Medicine) Jena Gauss, Gerrit Friends, MD as Consulting Physician (Gastroenterology) Janalyn Harder, MD (Inactive) as Consulting Physician (Dermatology)  This Provider for this visit: Treatment Team:  Attending Provider: Kalman Shan, MD  #IPF/UIP pattern #Associated pulmonary emphysema #Combined pulmonary fibrosis emphysema  -Chronic green sputum #Lung cancer #DNR , ECOG 3-4, Class 4 dyspnea  03/22/2023 -   Chief Complaint  Patient presents with   Follow-up    PFT:03/22/23 O2 4l     HPI Diane [name] 67 y.o. -returns for follow-up.  Since her last visit her symptoms have deteriorated.  Pulmonary function test also shows a steep decline in both FVC and particularly with  DLCO.  She tells me that ever since January 2024 she is having a steady decline but more so in the last 1 month.  And send the last 1 month her shower and getting dressed time has gotten worse.  She used to be able to get ready within 30 minutes now she is requiring 1 hour.  However at pulmonary rehabilitation the max oxygen she is required has been 8-10 L [4 L at rest] but 2 days ago abruptly she required 15 L and I was alerted about it.  She is denying any hemoptysis or fever or chills.  She has chronic green sputum but this is not change.  There is no worsening of cough there is no edema.  She is not on any anticoagulation.  In addition she is also noticing abrupt spasms of breathing for the last 1 month is almost a stridorous noise.  She was sucking air and it is loud it happens spontaneously.  Happens around 20 times a day.  Her husband comes rushing  OV 11/13/2022 transfer of care to the ILD center with Dr. Marchelle Gearing.  Subjective:  Patient ID: Diane [name], female , DOB: 1956-06-12 , age 60 y.o. , MRN: [id number] , ADDRESS: [address] [place] [address] PCP Ignatius Specking, MD Patient Care Team: Ignatius Specking, MD as PCP - General (Internal Medicine) Jena Gauss Gerrit Friends, MD as Consulting Physician (Gastroenterology) Janalyn Harder, MD (Inactive) as Consulting Physician (Dermatology)  This Provider for this visit: Treatment Team:  Attending Provider: Kalman Shan, MD    11/13/2022 -   Chief Complaint  Patient presents with   New Patient (Initial Visit)    New pt from [facility] for [facility]. Patient filled out packet and for Esbriet but never started the medication. CT is for April 2024. Pt is just getting over PNA. Still has a slight cough. Just finished prednisone and Levaquin.      HPI Norelle Ghazi 67 y.o. -presents with her husband [name].  [name] daughter-in-law Durel Salts with respiratory therapist at [facility].  She retire and then in the spring 2023 was diagnosed with right lower lobe non-small cell cancer stage II.  She is status post radiation and also chemotherapy.  Currently on observation treatment.  This is based on independent history from the husband and the patient and also review of the external records.  She is understandably quite frustrated with the onset of all these health issues given the fact she just retired.  Then approximately in September 2023 started noticing significant amount of dyspnea on exertion walking the dog and climbing stairs.  At this time desaturations with exercise to be noticed.  She has since been using oxygen and this helps her.  She is attended cardiac rehabilitation or pulmonary rehabilitation and this also helped her.  She was then given a diagnosis of pulmonary fibrosis/UIP.  Antifibrotic pirfenidone was started based on her history and also external  record review.  However due to some confusion of the paperwork this has not been started yet.  She is here for evaluation of her ILD.  Visualization of CT scan from 2015 shows presence of possible early ILD even back in 2015 with some honeycombing/emphysema at the base.  There was a craniocaudal gradient back pain.   Also of note she just went to a [place] cruise in mid January 2024.  When she came back on October 28, 2022.  Then on October 30, 2022 she had cough headaches no fever.  Her flu test and COVID was negative.  She is being treated for acute bronchitis with Levaquin and IM steroids and 6-day prednisone taper.  She will she was given a diagnosis of pneumonia actually.  She is almost back to baseline but still feels fatigued and still with some cough worse than her baseline. [place] [facility] Comprehensive ILD Questionnaire  Symptoms:  0 she believes she has had much rapid worsening in the last 6 months only.  Suspect this is after her radiation.  Current symptom scores are as below.   Past Medical History :  -She was told 20 years ago she had hiatal hernia and acid reflux but she says she has had no issues since then.  It is definitely not described in the current CT chest -She is awaiting a sleep study -She had seizures 12 years ago. -She has hypertension controlled on medication -Currently just finished antibiotic and steroid course-has had COVID-vaccine but never had COVID    ROS:  -Tired and does have  from the next room.  But she states her pulse ox remained stable.  Correlating with all this is the pulmonary function test to significantly decline.  Also CT scan of the chest in April 2024 that I personally visualized is significantly worse.  Dr. Arbutus Ped is doing another CT scan in July 2024.  Today she went to her primary care physician at [facility] but I do not have the results she showed me lipid results but she believes the liver function test was normal.  She says that she has been given a life expectancy of less than 1 year she is upset about this.  But she says she is also realistic.  She says she has advanced directives she is working on it.  She is agreed to be a DNR.  Symptom wise significant cough is there but dyspnea is even worse.  She is willing to get Hycodan to help both symptoms  Overall with her quality of life and decline while she is looking for the reversible and looking to remain as healthy and as alive as possible she is also willing to be seen by palliative care at home.  We did a goals of care discussion about all this.  Filled out [employer] emergency backup electricity form.  Also filled out handicap placard form.    OV 04/13/2023  Subjective:  Patient ID: Diane [name], female , DOB: 23-Sep-1956 , age 67 y.o. , MRN: [id number] , ADDRESS: [address] [place] [address] PCP Ignatius Specking, MD Patient Care Team: Ignatius Specking, MD as PCP - General (Internal Medicine) Jena Gauss Gerrit Friends, MD as Consulting Physician (Gastroenterology) Janalyn Harder, MD (Inactive) as Consulting Physician (Dermatology)  This Provider for this visit: Treatment Team:  Attending Provider: Kalman Shan, MD  Type of visit: Video Virtual Visit Identification of patient Diane [name] with 19-Mar-1956 and MRN [id number] - 2 person identifier Risks: Risks, benefits, limitations of telephone visit explained. Patient understood and verbalized agreement to proceed Anyone else on call: husband Patient location: her home This provider location: [address], [address]; [place]; [place] [address]. [facility]. [phone]    04/13/2023 -   Chief Complaint  Patient presents with   Follow-up    F/up discuss intervenous steroids and discuss some other medical issues.     HPI Diane [name] 67 y.o. - preents with husband on video visit . Has been betting worse. . IN interim, ABG without hypercapnia. CTA ruled out PE but showed worsning RLL nodule. Has known post XRT consolidation in RLL. No ILD flare up. Saw onc and rad onc. Notes reviewed. Advised SBRT mid uly x 5 sessions. She is worriued about ILD flare with XRT. But she is on pirfenidone. Wants me clearance pripr to SBRT and also if neeed to do high dose steroids    CTA 6/10;/24  Narrative & Impression  CLINICAL DATA:  Worsening shortness of breath and chest discomfort 3 weeks. Elevated D-dimer. Lung carcinoma. Undergoing chemotherapy. * Tracking Code: BO *   EXAM: CT ANGIOGRAPHY CHEST WITH CONTRAST   TECHNIQUE: Multidetector CT imaging of the  chest was performed using the standard protocol during bolus administration of intravenous contrast. Multiplanar CT image reconstructions and MIPs were obtained to evaluate the vascular anatomy.   RADIATION DOSE REDUCTION: This exam was performed according to the departmental dose-optimization program which includes automated exposure control, adjustment of the mA and/or kV according to patient size and/or use of iterative reconstruction technique.   CONTRAST:  from the next room.  But she states her pulse ox remained stable.  Correlating with all this is the pulmonary function test to significantly decline.  Also CT scan of the chest in April 2024 that I personally visualized is significantly worse.  Dr. Arbutus Ped is doing another CT scan in July 2024.  Today she went to her primary care physician at [facility] but I do not have the results she showed me lipid results but she believes the liver function test was normal.  She says that she has been given a life expectancy of less than 1 year she is upset about this.  But she says she is also realistic.  She says she has advanced directives she is working on it.  She is agreed to be a DNR.  Symptom wise significant cough is there but dyspnea is even worse.  She is willing to get Hycodan to help both symptoms  Overall with her quality of life and decline while she is looking for the reversible and looking to remain as healthy and as alive as possible she is also willing to be seen by palliative care at home.  We did a goals of care discussion about all this.  Filled out [employer] emergency backup electricity form.  Also filled out handicap placard form.    OV 04/13/2023  Subjective:  Patient ID: Diane [name], female , DOB: 23-Sep-1956 , age 67 y.o. , MRN: [id number] , ADDRESS: [address] [place] [address] PCP Ignatius Specking, MD Patient Care Team: Ignatius Specking, MD as PCP - General (Internal Medicine) Jena Gauss Gerrit Friends, MD as Consulting Physician (Gastroenterology) Janalyn Harder, MD (Inactive) as Consulting Physician (Dermatology)  This Provider for this visit: Treatment Team:  Attending Provider: Kalman Shan, MD  Type of visit: Video Virtual Visit Identification of patient Diane [name] with 19-Mar-1956 and MRN [id number] - 2 person identifier Risks: Risks, benefits, limitations of telephone visit explained. Patient understood and verbalized agreement to proceed Anyone else on call: husband Patient location: her home This provider location: [address], [address]; [place]; [place] [address]. [facility]. [phone]    04/13/2023 -   Chief Complaint  Patient presents with   Follow-up    F/up discuss intervenous steroids and discuss some other medical issues.     HPI Diane [name] 67 y.o. - preents with husband on video visit . Has been betting worse. . IN interim, ABG without hypercapnia. CTA ruled out PE but showed worsning RLL nodule. Has known post XRT consolidation in RLL. No ILD flare up. Saw onc and rad onc. Notes reviewed. Advised SBRT mid uly x 5 sessions. She is worriued about ILD flare with XRT. But she is on pirfenidone. Wants me clearance pripr to SBRT and also if neeed to do high dose steroids    CTA 6/10;/24  Narrative & Impression  CLINICAL DATA:  Worsening shortness of breath and chest discomfort 3 weeks. Elevated D-dimer. Lung carcinoma. Undergoing chemotherapy. * Tracking Code: BO *   EXAM: CT ANGIOGRAPHY CHEST WITH CONTRAST   TECHNIQUE: Multidetector CT imaging of the  chest was performed using the standard protocol during bolus administration of intravenous contrast. Multiplanar CT image reconstructions and MIPs were obtained to evaluate the vascular anatomy.   RADIATION DOSE REDUCTION: This exam was performed according to the departmental dose-optimization program which includes automated exposure control, adjustment of the mA and/or kV according to patient size and/or use of iterative reconstruction technique.   CONTRAST:  OV 11/13/2022 transfer of care to the ILD center with Dr. Marchelle Gearing.  Subjective:  Patient ID: Diane [name], female , DOB: 1956-06-12 , age 60 y.o. , MRN: [id number] , ADDRESS: [address] [place] [address] PCP Ignatius Specking, MD Patient Care Team: Ignatius Specking, MD as PCP - General (Internal Medicine) Jena Gauss Gerrit Friends, MD as Consulting Physician (Gastroenterology) Janalyn Harder, MD (Inactive) as Consulting Physician (Dermatology)  This Provider for this visit: Treatment Team:  Attending Provider: Kalman Shan, MD    11/13/2022 -   Chief Complaint  Patient presents with   New Patient (Initial Visit)    New pt from [facility]. Patient filled out packet and for Esbriet but never started the medication. CT is for April 2024. Pt is just getting over PNA. Still has a slight cough. Just finished prednisone and Levaquin.      HPI Norelle Ghazi 67 y.o. -presents with her husband [name].  [name] daughter-in-law Durel Salts with respiratory therapist at [facility].  She retire and then in the spring 2023 was diagnosed with right lower lobe non-small cell cancer stage II.  She is status post radiation and also chemotherapy.  Currently on observation treatment.  This is based on independent history from the husband and the patient and also review of the external records.  She is understandably quite frustrated with the onset of all these health issues given the fact she just retired.  Then approximately in September 2023 started noticing significant amount of dyspnea on exertion walking the dog and climbing stairs.  At this time desaturations with exercise to be noticed.  She has since been using oxygen and this helps her.  She is attended cardiac rehabilitation or pulmonary rehabilitation and this also helped her.  She was then given a diagnosis of pulmonary fibrosis/UIP.  Antifibrotic pirfenidone was started based on her history and also external  record review.  However due to some confusion of the paperwork this has not been started yet.  She is here for evaluation of her ILD.  Visualization of CT scan from 2015 shows presence of possible early ILD even back in 2015 with some honeycombing/emphysema at the base.  There was a craniocaudal gradient back pain.   Also of note she just went to a [place] cruise in mid January 2024.  When she came back on October 28, 2022.  Then on October 30, 2022 she had cough headaches no fever.  Her flu test and COVID was negative.  She is being treated for acute bronchitis with Levaquin and IM steroids and 6-day prednisone taper.  She will she was given a diagnosis of pneumonia actually.  She is almost back to baseline but still feels fatigued and still with some cough worse than her baseline. [place] Integrated Comprehensive ILD Questionnaire  Symptoms:  0 she believes she has had much rapid worsening in the last 6 months only.  Suspect this is after her radiation.  Current symptom scores are as below.   Past Medical History :  -She was told 20 years ago she had hiatal hernia and acid reflux but she says she has had no issues since then.  It is definitely not described in the current CT chest -She is awaiting a sleep study -She had seizures 12 years ago. -She has hypertension controlled on medication -Currently just finished antibiotic and steroid course-has had COVID-vaccine but never had COVID    ROS:  -Tired and does have  OV 11/13/2022 transfer of care to the ILD center with Dr. Marchelle Gearing.  Subjective:  Patient ID: Diane [name], female , DOB: 1956-06-12 , age 60 y.o. , MRN: [id number] , ADDRESS: [address] [place] [address] PCP Ignatius Specking, MD Patient Care Team: Ignatius Specking, MD as PCP - General (Internal Medicine) Jena Gauss Gerrit Friends, MD as Consulting Physician (Gastroenterology) Janalyn Harder, MD (Inactive) as Consulting Physician (Dermatology)  This Provider for this visit: Treatment Team:  Attending Provider: Kalman Shan, MD    11/13/2022 -   Chief Complaint  Patient presents with   New Patient (Initial Visit)    New pt from [facility] for [facility]. Patient filled out packet and for Esbriet but never started the medication. CT is for April 2024. Pt is just getting over PNA. Still has a slight cough. Just finished prednisone and Levaquin.      HPI Norelle Ghazi 67 y.o. -presents with her husband [name].  [name] daughter-in-law Durel Salts with respiratory therapist at [facility].  She retire and then in the spring 2023 was diagnosed with right lower lobe non-small cell cancer stage II.  She is status post radiation and also chemotherapy.  Currently on observation treatment.  This is based on independent history from the husband and the patient and also review of the external records.  She is understandably quite frustrated with the onset of all these health issues given the fact she just retired.  Then approximately in September 2023 started noticing significant amount of dyspnea on exertion walking the dog and climbing stairs.  At this time desaturations with exercise to be noticed.  She has since been using oxygen and this helps her.  She is attended cardiac rehabilitation or pulmonary rehabilitation and this also helped her.  She was then given a diagnosis of pulmonary fibrosis/UIP.  Antifibrotic pirfenidone was started based on her history and also external  record review.  However due to some confusion of the paperwork this has not been started yet.  She is here for evaluation of her ILD.  Visualization of CT scan from 2015 shows presence of possible early ILD even back in 2015 with some honeycombing/emphysema at the base.  There was a craniocaudal gradient back pain.   Also of note she just went to a [place] cruise in mid January 2024.  When she came back on October 28, 2022.  Then on October 30, 2022 she had cough headaches no fever.  Her flu test and COVID was negative.  She is being treated for acute bronchitis with Levaquin and IM steroids and 6-day prednisone taper.  She will she was given a diagnosis of pneumonia actually.  She is almost back to baseline but still feels fatigued and still with some cough worse than her baseline. [place] Integrated Comprehensive ILD Questionnaire  Symptoms:  0 she believes she has had much rapid worsening in the last 6 months only.  Suspect this is after her radiation.  Current symptom scores are as below.   Past Medical History :  -She was told 20 years ago she had hiatal hernia and acid reflux but she says she has had no issues since then.  It is definitely not described in the current CT chest -She is awaiting a sleep study -She had seizures 12 years ago. -She has hypertension controlled on medication -Currently just finished antibiotic and steroid course-has had COVID-vaccine but never had COVID    ROS:  -Tired and does have  CT ordered for 3 months which will be July 2024. She knows to call immediately for any concerning symptoms or changes in her breathing.   We are doing a therapeutic trial with Breztri. She is currently on Anoro which she states does not seem to help with her breathing. We discussed that there is a steroid component to the Humptulips which can increase risk of pneumonia. She knows to stop using inhaler and call to be seen if she has any changes in her breathing status. I have also prescribed an albuterol inhaler as she does have episodes  of what sounds like upper airway wheezing.   We need to continue to monitor for weight loss. On the Esbriet 3 tabs TID she lost a significant amount of weight. We will re-evaluate her weight upon follow up with Dr. Marchelle Gearing in 6 weeks on the 2 tablets TID.  She will need repeat LFT's in 4-6 weeks , She sees Dr. Elly Modena 03/22/2023.      Test Results: CT Chest with contrast 01/27/2023 Slight increase in focal opacity involving the superior segment right lower lobe with increasing pleural thickening and loculated small pleural fluid.   The previously seen hypermetabolic nodules in the right upper lobe and lower lobe separately are again noted today in our appear relatively similar when adjusting for technique.   Persistent soft tissue fullness of the right lung hilum as well as some prominent nodes in the mediastinum and left hilum.   Underlying advanced emphysematous lung changes.   Aortic Atherosclerosis (ICD10-I70.0) and Emphysema (ICD10-J43.9).   OV 03/22/2023  Subjective:  Patient ID: Diane [name], female , DOB: 04-Dec-1955 , age 46 y.o. , MRN: [id number] , ADDRESS: [address] [place] [address] PCP Ignatius Specking, MD Patient Care Team: Ignatius Specking, MD as PCP - General (Internal Medicine) Jena Gauss, Gerrit Friends, MD as Consulting Physician (Gastroenterology) Janalyn Harder, MD (Inactive) as Consulting Physician (Dermatology)  This Provider for this visit: Treatment Team:  Attending Provider: Kalman Shan, MD  #IPF/UIP pattern #Associated pulmonary emphysema #Combined pulmonary fibrosis emphysema  -Chronic green sputum #Lung cancer #DNR , ECOG 3-4, Class 4 dyspnea  03/22/2023 -   Chief Complaint  Patient presents with   Follow-up    PFT:03/22/23 O2 4l     HPI Diane [name] 67 y.o. -returns for follow-up.  Since her last visit her symptoms have deteriorated.  Pulmonary function test also shows a steep decline in both FVC and particularly with  DLCO.  She tells me that ever since January 2024 she is having a steady decline but more so in the last 1 month.  And send the last 1 month her shower and getting dressed time has gotten worse.  She used to be able to get ready within 30 minutes now she is requiring 1 hour.  However at pulmonary rehabilitation the max oxygen she is required has been 8-10 L [4 L at rest] but 2 days ago abruptly she required 15 L and I was alerted about it.  She is denying any hemoptysis or fever or chills.  She has chronic green sputum but this is not change.  There is no worsening of cough there is no edema.  She is not on any anticoagulation.  In addition she is also noticing abrupt spasms of breathing for the last 1 month is almost a stridorous noise.  She was sucking air and it is loud it happens spontaneously.  Happens around 20 times a day.  Her husband comes rushing  from the next room.  But she states her pulse ox remained stable.  Correlating with all this is the pulmonary function test to significantly decline.  Also CT scan of the chest in April 2024 that I personally visualized is significantly worse.  Dr. Arbutus Ped is doing another CT scan in July 2024.  Today she went to her primary care physician at [facility] but I do not have the results she showed me lipid results but she believes the liver function test was normal.  She says that she has been given a life expectancy of less than 1 year she is upset about this.  But she says she is also realistic.  She says she has advanced directives she is working on it.  She is agreed to be a DNR.  Symptom wise significant cough is there but dyspnea is even worse.  She is willing to get Hycodan to help both symptoms  Overall with her quality of life and decline while she is looking for the reversible and looking to remain as healthy and as alive as possible she is also willing to be seen by palliative care at home.  We did a goals of care discussion about all this.  Filled out [employer] emergency backup electricity form.  Also filled out handicap placard form.    OV 04/13/2023  Subjective:  Patient ID: Diane [name], female , DOB: 23-Sep-1956 , age 67 y.o. , MRN: [id number] , ADDRESS: [address] [place] [address] PCP Ignatius Specking, MD Patient Care Team: Ignatius Specking, MD as PCP - General (Internal Medicine) Jena Gauss Gerrit Friends, MD as Consulting Physician (Gastroenterology) Janalyn Harder, MD (Inactive) as Consulting Physician (Dermatology)  This Provider for this visit: Treatment Team:  Attending Provider: Kalman Shan, MD  Type of visit: Video Virtual Visit Identification of patient Diane [name] with 19-Mar-1956 and MRN [id number] - 2 person identifier Risks: Risks, benefits, limitations of telephone visit explained. Patient understood and verbalized agreement to proceed Anyone else on call: husband Patient location: her home This provider location: [address], [address]; [place]; [place] [address]. [facility]. [phone]    04/13/2023 -   Chief Complaint  Patient presents with   Follow-up    F/up discuss intervenous steroids and discuss some other medical issues.     HPI Diane [name] 67 y.o. - preents with husband on video visit . Has been betting worse. . IN interim, ABG without hypercapnia. CTA ruled out PE but showed worsning RLL nodule. Has known post XRT consolidation in RLL. No ILD flare up. Saw onc and rad onc. Notes reviewed. Advised SBRT mid uly x 5 sessions. She is worriued about ILD flare with XRT. But she is on pirfenidone. Wants me clearance pripr to SBRT and also if neeed to do high dose steroids    CTA 6/10;/24  Narrative & Impression  CLINICAL DATA:  Worsening shortness of breath and chest discomfort 3 weeks. Elevated D-dimer. Lung carcinoma. Undergoing chemotherapy. * Tracking Code: BO *   EXAM: CT ANGIOGRAPHY CHEST WITH CONTRAST   TECHNIQUE: Multidetector CT imaging of the  chest was performed using the standard protocol during bolus administration of intravenous contrast. Multiplanar CT image reconstructions and MIPs were obtained to evaluate the vascular anatomy.   RADIATION DOSE REDUCTION: This exam was performed according to the departmental dose-optimization program which includes automated exposure control, adjustment of the mA and/or kV according to patient size and/or use of iterative reconstruction technique.   CONTRAST:  75mL OMNIPAQUE IOHEXOL 350 MG/ML SOLN   COMPARISON:  01/25/2023   FINDINGS: Cardiovascular: Satisfactory opacification of pulmonary arteries noted, and no pulmonary emboli identified. No evidence of thoracic aortic dissection or aneurysm. Aortic and coronary atherosclerotic calcification incidentally noted.   Mediastinum/Nodes: No masses or pathologically enlarged lymph nodes identified.   Lungs/Pleura: Centrilobular and paraseptal emphysema again noted. Chronic changes of subpleural fibrosis and honeycombing again seen with bibasilar predominance. Areas of confluent opacity and central bronchiectasis are again seen in the right upper and lower lobes, without significant change. A poorly defined nodular opacity in the posterior right lower lobe on image 95/5 currently measures 1.9 x 1.8 cm, compared to 1.2 x 1.0 cm previously. No other new or enlarging sites of pulmonary opacity identified. No evidence of pleural effusion.   Upper abdomen: No acute findings.   Musculoskeletal: No suspicious bone lesions identified.   Review of the MIP images confirms the above findings.   IMPRESSION: No evidence of pulmonary embolism.   Increased size of 1.9 cm nodular opacity in posterior right lower lobe, suspicious for carcinoma.   No significant change in areas of right lung airspace disease and bronchiectasis, likely due to post radiation changes.   Chronic pulmonary emphysema and fibrosis.   Aortic Atherosclerosis  (ICD10-I70.0) and Emphysema (ICD10-J43.9).     Electronically Signed   By: Danae Orleans M.D.   On: 03/26/2023 16:10      NM PET scan 04/05/23  Narrative & Impression  CLINICAL DATA:  Subsequent treatment strategy for lung carcinoma. Chemotherapy ongoing. Concern for increasing RIGHT lower lobe nodule.   EXAM: NUCLEAR MEDICINE PET SKULL BASE TO THIGH   TECHNIQUE: 7.47 mCi F-18 FDG was injected intravenously. Full-ring PET imaging was performed from the skull base to thigh after the radiotracer. CT data was obtained and used for attenuation correction and anatomic localization.   Fasting blood glucose: 110 mg/dl   COMPARISON:  CT 08/65/7846 com PET-CT 10/04/2022   FINDINGS: Mediastinal blood pool activity: SUV max 2.0   Liver activity: SUV max NA   NECK: No hypermetabolic lymph nodes in the neck.   Incidental CT findings: None.   CHEST: Rounded focus of intense metabolic activity in the RIGHT lower lobe measures 2.7 cm with SUV max equal 15.4. This corresponds to nodule of concern on comparison CT.   Band of parenchymal consolidation in the more superior RIGHT lower lobe has mild metabolic activity favored post radiation change. Band parenchymal consolidation in the RIGHT upper lobe is also similar mild metabolic activity.   No hypermetabolic mediastinal lymph nodes.   Incidental CT findings: None.   ABDOMEN/PELVIS: No abnormal hypermetabolic activity within the liver, pancreas, adrenal glands, or spleen. No hypermetabolic lymph nodes in the abdomen or pelvis.   Incidental CT findings: None.   SKELETON: No focal hypermetabolic activity to suggest skeletal metastasis.   Incidental CT findings: None.   IMPRESSION: 1. Persistent pulmonary nodule in the inferior RIGHT lower lobe with intense metabolic activity is concerning for lung cancer recurrence. 2. Consolidation in the superior aspect of the RIGHT lower lobe and RIGHT upper lobe is similar to prior and  most consistent with post radiation change. 3. No hypermetabolic mediastinal lymph nodes. 4. No distant metastatic disease.     Electronically Signed   By: Genevive Bi M.D.   On: 04/09/2023 10:22     OV 08/07/2023  Subjective:  Patient ID: Diane [name], female , DOB: 1956-02-17 , age 89 y.o. , MRN: [id number] , ADDRESS: [address]  CT ordered for 3 months which will be July 2024. She knows to call immediately for any concerning symptoms or changes in her breathing.   We are doing a therapeutic trial with Breztri. She is currently on Anoro which she states does not seem to help with her breathing. We discussed that there is a steroid component to the [place] which can increase risk of pneumonia. She knows to stop using inhaler and call to be seen if she has any changes in her breathing status. I have also prescribed an albuterol inhaler as she does have episodes  of what sounds like upper airway wheezing.   We need to continue to monitor for weight loss. On the Esbriet 3 tabs TID she lost a significant amount of weight. We will re-evaluate her weight upon follow up with Dr. Marchelle Gearing in 6 weeks on the 2 tablets TID.  She will need repeat LFT's in 4-6 weeks , She sees Dr. Elly Modena 03/22/2023.      Test Results: CT Chest with contrast 01/27/2023 Slight increase in focal opacity involving the superior segment right lower lobe with increasing pleural thickening and loculated small pleural fluid.   The previously seen hypermetabolic nodules in the right upper lobe and lower lobe separately are again noted today in our appear relatively similar when adjusting for technique.   Persistent soft tissue fullness of the right lung hilum as well as some prominent nodes in the mediastinum and left hilum.   Underlying advanced emphysematous lung changes.   Aortic Atherosclerosis (ICD10-I70.0) and Emphysema (ICD10-J43.9).   OV 03/22/2023  Subjective:  Patient ID: Diane [name], female , DOB: 04-Dec-1955 , age 46 y.o. , MRN: [id number] , ADDRESS: [address] [place] [address] PCP Ignatius Specking, MD Patient Care Team: Ignatius Specking, MD as PCP - General (Internal Medicine) Jena Gauss, Gerrit Friends, MD as Consulting Physician (Gastroenterology) Janalyn Harder, MD (Inactive) as Consulting Physician (Dermatology)  This Provider for this visit: Treatment Team:  Attending Provider: Kalman Shan, MD  #IPF/UIP pattern #Associated pulmonary emphysema #Combined pulmonary fibrosis emphysema  -Chronic green sputum #Lung cancer #DNR , ECOG 3-4, Class 4 dyspnea  03/22/2023 -   Chief Complaint  Patient presents with   Follow-up    PFT:03/22/23 O2 4l     HPI Diane [name] 67 y.o. -returns for follow-up.  Since her last visit her symptoms have deteriorated.  Pulmonary function test also shows a steep decline in both FVC and particularly with  DLCO.  She tells me that ever since January 2024 she is having a steady decline but more so in the last 1 month.  And send the last 1 month her shower and getting dressed time has gotten worse.  She used to be able to get ready within 30 minutes now she is requiring 1 hour.  However at pulmonary rehabilitation the max oxygen she is required has been 8-10 L [4 L at rest] but 2 days ago abruptly she required 15 L and I was alerted about it.  She is denying any hemoptysis or fever or chills.  She has chronic green sputum but this is not change.  There is no worsening of cough there is no edema.  She is not on any anticoagulation.  In addition she is also noticing abrupt spasms of breathing for the last 1 month is almost a stridorous noise.  She was sucking air and it is loud it happens spontaneously.  Happens around 20 times a day.  Her husband comes rushing

## 2023-08-07 NOTE — Patient Instructions (Addendum)
    IPF (idiopathic pulmonary fibrosis) (HCC) Pulmonary emphysema, unspecified emphysema type (HCC) History of lung cancer- stgage 2 b with recurrence right lower lobe June 2024 Chronic respiratory failure with hypoxia (HCC)    -stable since last visit; ongoing oxygen needs - s/p radiation to lung endingl August 2024 - Severe cough due to fibrosis and lung cancer Hycodan helping - no flare up or clniical worsening of respiratory failure - normal LFT oct 2024 - normal BNP summer 2024 - improved anemia oct 2024   Plan   -Continue oxygen 4 L at rest [likely need 4 L nasal cannula at night as well] and use 8-12 L with exertion  -For cough  - continue Hycodan 5 mL twice daily for palliative relief of both shortness of breath and cough  -Use as needed to control cough  - if needed tell CMA 08/07/2023 to do refill - wil refill 08/07/2023    - high dose flu shot 08/07/2023   - keep active  - continue esbriet (pirfenidone) as before  - restart inhaler for associated emphysema  - try sample spirivan respimat 2 puff once daily (2.5 strength); noted Breztril was expensive     Plan -3-4 months 30 min visit  - can be video visit

## 2023-08-07 NOTE — Addendum Note (Signed)
Addended by: Glynda Jaeger on: 08/07/2023 02:44 PM   Modules accepted: Orders

## 2023-08-07 NOTE — Progress Notes (Signed)
[facility] [phone]   [phone]  OFFICE PROGRESS NOTE  Ignatius Specking, MD [address] [place] [address]  DIAGNOSIS: Unresectable stage IIB (T2a, N1, M0) non-small cell lung cancer, squamous cell carcinoma presented with right lower lobe lung mass in addition to right hilar lymphadenopathy diagnosed in March 2023.    The patient had PD-L1 expression of 99%.  PRIOR THERAPY: A course of concurrent chemoradiation with weekly carboplatin for AUC of 2 and paclitaxel 45 Mg/M2.  First dose started 01/16/2022.  Status post 7 cycles.  Last dose was given on 02/27/2022.  CURRENT THERAPY: Observation.  INTERVAL HISTORY: Diane [name] 67 y.o. female returns to the clinic today for follow-up visit accompanied by her husband.Discussed the use of AI scribe software for clinical note transcription with the patient, who gave verbal consent to proceed.  History of Present Illness   Diane [name], a 67 year old patient with a history of unresectable stage 2B non-small cell lung cancer (squamous cell carcinoma), was diagnosed in March 2023. The patient underwent a course of chemotherapy and radiation and has been under observation since May 2023. The patient also has a PD-L1 exhalation, a marker for immune therapy, at 99%, but this has not been utilized yet.  The patient reports feeling tired but otherwise good. There have been no new symptoms since the last visit three months ago. However, the patient did experience some chest pain, which has since subsided. This pain prompted a visit to the ER, but no issues were found. The patient is currently on home oxygen, using 3-4 liters at rest and 8-10 liters with exertion.  The patient has been under the care of a pulmonologist, Dr. Marchelle Gearing for interstitial lung disease, who has reported that the patient's condition is stable.       MEDICAL HISTORY: Past Medical History:  Diagnosis Date   Anxiety     Arthritis    fingers, left foot   C. difficile diarrhea 2015   COPD (chronic obstructive pulmonary disease) (HCC)    Depression    Eczema    H pylori ulcer    Headache(784.0)    Hypercholesterolemia    Hypertension    Interstitial lung disease (HCC)    Kidney stones    20 years ago   lung ca with recurrence 10/2021   01/2023   Pneumonia    5 years ago   Seizures (HCC)    2 years ago, "cluster of seizures" none since   Shingles     ALLERGIES:  is allergic to alpha-gal, other, and penicillins.  MEDICATIONS:  Current Outpatient Medications  Medication Sig Dispense Refill   albuterol (PROVENTIL) (5 MG/ML) 0.5% nebulizer solution Take 2.5 mg by nebulization every 6 (six) hours as needed for wheezing or shortness of breath.     albuterol (VENTOLIN HFA) 108 (90 Base) MCG/ACT inhaler Inhale 2 puffs into the lungs every 6 (six) hours as needed for wheezing or shortness of breath. 8 g 2   ALPRAZolam (XANAX) 0.5 MG tablet Take 0.5 mg by mouth daily as needed.     Budeson-Glycopyrrol-Formoterol (BREZTRI AEROSPHERE) 160-9-4.8 MCG/ACT AERO Inhale 2 puffs into the lungs in the morning and at bedtime. (Patient not taking: Reported on 04/13/2023) 2 each 0   cetirizine (ZYRTEC) 10 MG tablet Take 10 mg by mouth at bedtime.     EPINEPHrine (AUVI-Q) 0.3 mg/0.3 mL IJ SOAJ injection Inject 0.3 mLs (0.3 mg total) into the muscle as needed for anaphylaxis.  2 each 1   fluticasone (FLONASE) 50 MCG/ACT nasal spray Place 2 sprays into both nostrils daily. (Patient taking differently: Place 2 sprays into both nostrils daily as needed for allergies.) 16 g 5   HYDROcodone bit-homatropine (HYCODAN) 5-1.5 MG/5ML syrup Take 5 mLs by mouth every 6 (six) hours as needed for cough (lung cancer and fibrosis). 240 mL 0   omeprazole (PRILOSEC) 20 MG capsule Take 1 capsule (20 mg total) by mouth 2 (two) times daily. (Needs to be seen before next refill) 60 capsule 0   OVER THE COUNTER MEDICATION Take 1 capsule by mouth  daily. Probiotic     Pirfenidone (ESBRIET) 267 MG TABS Take 3 tablets (801 mg total) by mouth 3 (three) times daily with meals. (Account: [email or web address]) 270 tablet 4   rosuvastatin (CRESTOR) 10 MG tablet Take 10 mg by mouth at bedtime.     umeclidinium-vilanterol (ANORO ELLIPTA) 62.5-25 MCG/ACT AEPB Inhale 1 puff into the lungs daily. 60 each 3   No current facility-administered medications for this visit.    SURGICAL HISTORY:  Past Surgical History:  Procedure Laterality Date   ABDOMINAL HYSTERECTOMY     ADENOIDECTOMY     APPENDECTOMY     BRONCHIAL BIOPSY  12/20/2021   Procedure: BRONCHIAL BIOPSIES;  Surgeon: Josephine Igo, DO;  Location: [facility];  Service: Pulmonary;;   BRONCHIAL BRUSHINGS  12/20/2021   Procedure: BRONCHIAL BRUSHINGS;  Surgeon: Josephine Igo, DO;  Location: [facility];  Service: Pulmonary;;   BRONCHIAL NEEDLE ASPIRATION BIOPSY  12/20/2021   Procedure: BRONCHIAL NEEDLE ASPIRATION BIOPSIES;  Surgeon: Josephine Igo, DO;  Location: [facility];  Service: Pulmonary;;   CESAREAN SECTION     CHOLECYSTECTOMY     COLONOSCOPY  06/10/2012   RMR: Colonic polyps -removed as described above.    FINE NEEDLE ASPIRATION  12/20/2021   Procedure: FINE NEEDLE ASPIRATION (FNA) LINEAR;  Surgeon: Josephine Igo, DO;  Location: [facility];  Service: Pulmonary;;   KNEE ARTHROSCOPY  1973   right knee,  torn cart   LUMBAR DISC SURGERY  05/12/2015   L4 L5   LUMBAR LAMINECTOMY/DECOMPRESSION MICRODISCECTOMY Left 05/12/2015   Procedure: Left L4-5 Microdiscectomy;  Surgeon: Eldred Manges, MD;  Location: [facility];  Service: Orthopedics;  Laterality: Left;   TONSILLECTOMY     VIDEO BRONCHOSCOPY WITH ENDOBRONCHIAL ULTRASOUND Bilateral 12/20/2021   Procedure: VIDEO BRONCHOSCOPY WITH ENDOBRONCHIAL ULTRASOUND;  Surgeon: Josephine Igo, DO;  Location: [facility];  Service: Pulmonary;  Laterality: Bilateral;   VIDEO BRONCHOSCOPY WITH RADIAL ENDOBRONCHIAL ULTRASOUND  12/20/2021    Procedure: RADIAL ENDOBRONCHIAL ULTRASOUND;  Surgeon: Josephine Igo, DO;  Location: [facility];  Service: Pulmonary;;    REVIEW OF SYSTEMS:  Constitutional: positive for fatigue Eyes: negative Ears, nose, mouth, throat, and face: negative Respiratory: positive for dyspnea on exertion Cardiovascular: negative Gastrointestinal: negative Genitourinary:negative Integument/breast: negative Hematologic/lymphatic: negative Musculoskeletal:negative Neurological: negative Behavioral/Psych: negative Endocrine: negative Allergic/Immunologic: negative   PHYSICAL EXAMINATION: General appearance: alert, cooperative, fatigued, and no distress Head: Normocephalic, without obvious abnormality, atraumatic Neck: no adenopathy, no JVD, supple, symmetrical, trachea midline, and thyroid not enlarged, symmetric, no tenderness/mass/nodules Lymph nodes: Cervical, supraclavicular, and axillary nodes normal. Resp: rales bilaterally Back: symmetric, no curvature. ROM normal. No CVA tenderness. Cardio: regular rate and rhythm, S1, S2 normal, no murmur, click, rub or gallop GI: soft, non-tender; bowel sounds normal; no masses,  no organomegaly Extremities: extremities normal, atraumatic, no cyanosis or edema Neurologic: Alert and oriented X 3, normal strength and tone. Normal symmetric  reflexes. Normal coordination and gait  ECOG PERFORMANCE STATUS: 1 - Symptomatic but completely ambulatory  Blood pressure 132/62, pulse 87, temperature 97.7 F (36.5 C), temperature source Oral, resp. rate 18, height 5\' 5"  (1.651 m), weight 131 lb (59.4 kg), SpO2 98%.  LABORATORY DATA: Lab Results  Component Value Date   WBC 6.0 07/31/2023   HGB 11.6 (L) 07/31/2023   HCT 35.5 (L) 07/31/2023   MCV 94.4 07/31/2023   PLT 277 07/31/2023      Chemistry      Component Value Date/Time   NA 137 07/31/2023 0945   NA 139 12/03/2019 0928   K 4.2 07/31/2023 0945   CL 101 07/31/2023 0945   CO2 29 07/31/2023 0945   BUN  12 07/31/2023 0945   BUN 14 12/03/2019 0928   CREATININE 0.81 07/31/2023 0945   CREATININE 0.64 10/14/2021 0953      Component Value Date/Time   CALCIUM 10.0 07/31/2023 0945   ALKPHOS 94 07/31/2023 0945   AST 22 07/31/2023 0945   ALT 16 07/31/2023 0945   BILITOT 0.3 07/31/2023 0945       RADIOGRAPHIC STUDIES: CT Chest W Contrast  Result Date: 08/06/2023 CLINICAL DATA:  Non-small cell lung cancer. Assess treatment response. * Tracking Code: BO * EXAM: CT CHEST WITH CONTRAST TECHNIQUE: Multidetector CT imaging of the chest was performed during intravenous contrast administration. RADIATION DOSE REDUCTION: This exam was performed according to the departmental dose-optimization program which includes automated exposure control, adjustment of the mA and/or kV according to patient size and/or use of iterative reconstruction technique. CONTRAST:  75mL OMNIPAQUE IOHEXOL 300 MG/ML  SOLN COMPARISON:  Chest CT 05/18/2023 and prior PET-CT 04/13/2023 FINDINGS: Cardiovascular: The heart is normal in size. No pericardial effusion. Scattered fluid in the pericardial recesses. Stable aortic and branch vessel calcifications but no dissection or aneurysm. Mediastinum/Nodes: Stable borderline enlarged upper mediastinal lymph nodes. 7 mm node on image 34/2 is stable. Right paratracheal node on image 43/2 measures 9 mm and appears new. 9 mm precarinal lymph node on image 58/2 is stable. 8 mm AP window node on the same image is also stable. 15 mm subcarinal node on image 67/2 previously measured 10 mm. Lungs/Pleura: Stable severe underlying lung disease with bullous emphysema and pulmonary scarring. Overall stable extensive radiation changes involving the right lung with extensive fibrosis and consolidation and bronchiectasis. The right lower lobe pulmonary nodule is surrounded by soft tissue density, likely radiation change. Difficult to measure a discrete lesion. No new pulmonary nodules are identified. Upper Abdomen:  No significant upper abdominal findings. Stable vascular disease. Status post cholecystectomy. Musculoskeletal: No breast masses, supraclavicular or axillary adenopathy. No lytic or sclerotic bone lesions are identified. IMPRESSION: 1. Stable extensive radiation changes involving the right lung. The right lower lobe pulmonary nodule is surrounded by soft tissue density, likely radiation change. Difficult to measure a discrete lesion. No new pulmonary lesions. 2. Stable severe underlying emphysematous changes and pulmonary scarring. 3. New, slightly larger and stable mediastinal lymph nodes as above. 4. No findings for upper abdominal metastatic disease. Aortic Atherosclerosis (ICD10-I70.0) and Emphysema (ICD10-J43.9). Electronically Signed   By: Rudie Meyer M.D.   On: 08/06/2023 16:46    ASSESSMENT AND PLAN: This is a very pleasant 67  years old white female diagnosed with unresectable stage IIb (T2 a, N1, M0) non-small cell lung cancer, squamous cell carcinoma with right lower lobe lung mass in addition to right hilar lymphadenopathy in March 2023. The patient has PD-L1 expression of 99%.  She completed a course of concurrent chemoradiation with weekly carboplatin for AUC of 2 and paclitaxel 45 Mg/M2 status post 7 cycles.  Last dose was given on 02/27/2022. The patient is currently on observation and she is feeling fine except for the baseline shortness of breath and fatigue. She had repeat CT scan of the chest performed recently.  I personally and independently reviewed the scan images and discussed the result with the patient and her husband. Her previous CT scan in December 2023 showed finding compatible with a combination of emphysema and interstitial lung disease which has been progressive.  She also had evolving postradiation changes in the right lung and the treated nodule in the superior segment of the right lower lobe appeared grossly similar but there was new nodules within the right lung most  concerning of which is in the right upper lobe and currently measures 1.9 x 1.3 cm requiring close monitoring.  She had a PET scan performed in early January 2024 and it showed no concerning findings for disease progression in the area of concern showed moderate metabolic activity that are nonspecific and may be related to infectious or inflammatory process but neoplasm could not be completely excluded.  She continues to be on observation and she had repeat CT scan of the chest performed recently.  I personally and independently reviewed the scan images and discussed the result with the patient today. Her scan showed stable disease except for new and slightly larger as well as stable mediastinal lymph nodes that need close monitoring.    Stage 2B Non-Small Cell Lung Cancer (Squamous Cell Carcinoma) Patient completed chemo and radiation in May 2023. Recent CT scan shows slight enlargement of some lymph nodes, which could be inflammation or early signs of recurrence. Patient is asymptomatic except for fatigue. -Schedule follow-up CT scan in 2 months to monitor lymph nodes. -Consider immunotherapy if lymph nodes continue to grow, given high PD-L1 expression.  Interstitial Lung Disease Patient is on home oxygen (3-4L at rest, 8-10L with exertion) and recently added Anoro inhaler. -Continue current management under Dr. Marchelle Gearing.  General Health Maintenance -Continue current medications, including new Anoro inhaler. -Follow-up appointment in 2 months, with CT scan scheduled a week prior.   The patient was advised to call immediately if she has any other concerning symptoms in the interval. The patient voices understanding of current disease status and treatment options and is in agreement with the current care plan.  All questions were answered. The patient knows to call the clinic with any problems, questions or concerns. We can certainly see the patient much sooner if necessary.  The total time  spent in the appointment was 30 minutes.  Disclaimer: This note was dictated with voice recognition software. Similar sounding words can inadvertently be transcribed and may not be corrected upon review.

## 2023-08-28 ENCOUNTER — Encounter: Payer: Self-pay | Admitting: Internal Medicine

## 2023-08-28 ENCOUNTER — Other Ambulatory Visit: Payer: Self-pay | Admitting: Internal Medicine

## 2023-08-28 DIAGNOSIS — J849 Interstitial pulmonary disease, unspecified: Secondary | ICD-10-CM

## 2023-08-28 MED ORDER — PIRFENIDONE 267 MG PO TABS: 801.0000 mg | ORAL_TABLET | Freq: Three times a day (TID) | ORAL | 5 refills | Status: DC

## 2023-08-31 ENCOUNTER — Other Ambulatory Visit: Payer: Self-pay | Admitting: Internal Medicine

## 2023-09-06 ENCOUNTER — Encounter: Payer: Self-pay | Admitting: Internal Medicine

## 2023-09-12 MED ORDER — HYDROCODONE BIT-HOMATROP MBR 5-1.5 MG/5ML PO SOLN: 5.0000 mL | Freq: Four times a day (QID) | ORAL | 0 refills | Status: DC | PRN

## 2023-09-12 NOTE — Telephone Encounter (Signed)
Per chart patient was given Hycodan 08/07/23,  Please advise on request, thank you!

## 2023-09-25 ENCOUNTER — Ambulatory Visit (HOSPITAL_COMMUNITY)
Admission: RE | Admit: 2023-09-25 | Discharge: 2023-09-25 | Disposition: A | Discharge status: Home / Self Care | Payer: Medicare Other | Source: Ambulatory Visit | Attending: Internal Medicine | Admitting: Internal Medicine

## 2023-09-25 DIAGNOSIS — C3490 Malignant neoplasm of unspecified part of unspecified bronchus or lung: Secondary | ICD-10-CM | POA: Diagnosis present

## 2023-09-25 MED ORDER — IOHEXOL 300 MG/ML  SOLN
75.0000 mL | Freq: Once | INTRAMUSCULAR | Status: AC | PRN
  Administered 2023-09-25: 75 mL via INTRAVENOUS

## 2023-09-26 ENCOUNTER — Telehealth: Payer: Self-pay | Admitting: Internal Medicine

## 2023-09-26 NOTE — Telephone Encounter (Signed)
Patient is aware of scheduled appointment times/dates

## 2023-10-01 ENCOUNTER — Inpatient Hospital Stay: Payer: Medicare Other | Attending: Internal Medicine

## 2023-10-01 DIAGNOSIS — Z85118 Personal history of other malignant neoplasm of bronchus and lung: Secondary | ICD-10-CM | POA: Insufficient documentation

## 2023-10-01 DIAGNOSIS — Z923 Personal history of irradiation: Secondary | ICD-10-CM | POA: Insufficient documentation

## 2023-10-01 DIAGNOSIS — Z9221 Personal history of antineoplastic chemotherapy: Secondary | ICD-10-CM | POA: Insufficient documentation

## 2023-10-02 ENCOUNTER — Inpatient Hospital Stay: Payer: Medicare Other

## 2023-10-02 ENCOUNTER — Telehealth: Payer: Medicare Other | Admitting: Internal Medicine

## 2023-10-02 DIAGNOSIS — Z923 Personal history of irradiation: Secondary | ICD-10-CM | POA: Diagnosis not present

## 2023-10-02 DIAGNOSIS — C3490 Malignant neoplasm of unspecified part of unspecified bronchus or lung: Secondary | ICD-10-CM

## 2023-10-02 DIAGNOSIS — Z9221 Personal history of antineoplastic chemotherapy: Secondary | ICD-10-CM | POA: Diagnosis not present

## 2023-10-02 DIAGNOSIS — Z85118 Personal history of other malignant neoplasm of bronchus and lung: Secondary | ICD-10-CM | POA: Diagnosis present

## 2023-10-02 LAB — CBC WITH DIFFERENTIAL (CANCER CENTER ONLY)
Abs Immature Granulocytes: 0.03 10*3/uL (ref 0.00–0.07)
Basophils Absolute: 0 10*3/uL (ref 0.0–0.1)
Basophils Relative: 1 %
Eosinophils Absolute: 0.3 10*3/uL (ref 0.0–0.5)
Eosinophils Relative: 5 %
HCT: 32.3 % — ABNORMAL LOW (ref 36.0–46.0)
Hemoglobin: 11 g/dL — ABNORMAL LOW (ref 12.0–15.0)
Immature Granulocytes: 1 %
Lymphocytes Relative: 17 %
Lymphs Abs: 1 10*3/uL (ref 0.7–4.0)
MCH: 31.8 pg (ref 26.0–34.0)
MCHC: 34.1 g/dL (ref 30.0–36.0)
MCV: 93.4 fL (ref 80.0–100.0)
Monocytes Absolute: 0.5 10*3/uL (ref 0.1–1.0)
Monocytes Relative: 9 %
Neutro Abs: 4 10*3/uL (ref 1.7–7.7)
Neutrophils Relative %: 67 %
Platelet Count: 276 10*3/uL (ref 150–400)
RBC: 3.46 MIL/uL — ABNORMAL LOW (ref 3.87–5.11)
RDW: 12 % (ref 11.5–15.5)
WBC Count: 5.9 10*3/uL (ref 4.0–10.5)
nRBC: 0 % (ref 0.0–0.2)

## 2023-10-02 LAB — CMP (CANCER CENTER ONLY)
ALT: 17 U/L (ref 0–44)
AST: 23 U/L (ref 15–41)
Albumin: 4.1 g/dL (ref 3.5–5.0)
Alkaline Phosphatase: 73 U/L (ref 38–126)
Anion gap: 7 (ref 5–15)
BUN: 11 mg/dL (ref 8–23)
CO2: 29 mmol/L (ref 22–32)
Calcium: 9.7 mg/dL (ref 8.9–10.3)
Chloride: 102 mmol/L (ref 98–111)
Creatinine: 0.64 mg/dL (ref 0.44–1.00)
GFR, Estimated: >60 mL/min (ref >60)
Glucose, Bld: 83 mg/dL (ref 70–99)
Potassium: 4.2 mmol/L (ref 3.5–5.1)
Sodium: 138 mmol/L (ref 135–145)
Total Bilirubin: 0.3 mg/dL (ref <1.2)
Total Protein: 7.9 g/dL (ref 6.5–8.1)

## 2023-10-09 ENCOUNTER — Ambulatory Visit: Payer: Medicare Other | Admitting: Internal Medicine

## 2023-10-15 ENCOUNTER — Inpatient Hospital Stay (HOSPITAL_BASED_OUTPATIENT_CLINIC_OR_DEPARTMENT_OTHER): Payer: Medicare Other | Admitting: Internal Medicine

## 2023-10-15 VITALS — BP 108/67 | HR 77 | Temp 97.8°F | Resp 16 | Ht 65.0 in | Wt 128.5 lb

## 2023-10-15 DIAGNOSIS — C3490 Malignant neoplasm of unspecified part of unspecified bronchus or lung: Secondary | ICD-10-CM | POA: Diagnosis not present

## 2023-10-15 DIAGNOSIS — Z85118 Personal history of other malignant neoplasm of bronchus and lung: Secondary | ICD-10-CM | POA: Diagnosis not present

## 2023-10-15 NOTE — Progress Notes (Signed)
[facility] [phone]   [phone]  OFFICE PROGRESS NOTE  Ignatius Specking, MD [address] [place] [address]  DIAGNOSIS: Unresectable stage IIB (T2a, N1, M0) non-small cell lung cancer, squamous cell carcinoma presented with right lower lobe lung mass in addition to right hilar lymphadenopathy diagnosed in March 2023.    The patient had PD-L1 expression of 99%.  PRIOR THERAPY: A course of concurrent chemoradiation with weekly carboplatin for AUC of 2 and paclitaxel 45 Mg/M2.  First dose started 01/16/2022.  Status post 7 cycles.  Last dose was given on 02/27/2022.  CURRENT THERAPY: Observation.  INTERVAL HISTORY: Diane [name] 67 y.o. female returns to the clinic today for follow-up visit accompanied by her husband.Discussed the use of AI scribe software for clinical note transcription with the patient, who gave verbal consent to proceed.  History of Present Illness   The patient, diagnosed with unresectable stage 2B lung cancer in March 2023, underwent concurrent chemoradiation with weekly carboplatin and paclitaxel. The tumor was noted to have a PD-L1 exhalation of 99%. Since the last visit, the patient reports intermittent chest pain, described as a sharp, knife-like sensation, localized to the right side where radiation was previously administered. The patient suspects this could be muscle spasms related to prior radiation therapy.  The patient continues to experience shortness of breath and requires 24/7 oxygen at 4 liters. This is attributed to underlying COPD, emphysema, and pulmonary fibrosis. The patient is also on Pirfenidone, which is believed to cause some gastrointestinal issues and a slight weight loss of about two pounds. Despite these issues, the patient reports managing the side effects of Pirfenidone reasonably well.  The patient's most recent scan showed no significant changes, with no evidence of lymph node growth.         MEDICAL HISTORY: Past Medical History:  Diagnosis Date   Anxiety    Arthritis    fingers, left foot   C. difficile diarrhea 2015   COPD (chronic obstructive pulmonary disease) (HCC)    Depression    Eczema    H pylori ulcer    Headache(784.0)    Hypercholesterolemia    Hypertension    Interstitial lung disease (HCC)    Kidney stones    20 years ago   lung ca with recurrence 10/2021   01/2023   Pneumonia    5 years ago   Seizures (HCC)    2 years ago, "cluster of seizures" none since   Shingles     ALLERGIES:  is allergic to alpha-gal, other, and penicillins.  MEDICATIONS:  Current Outpatient Medications  Medication Sig Dispense Refill   albuterol (PROVENTIL) (5 MG/ML) 0.5% nebulizer solution Take 2.5 mg by nebulization every 6 (six) hours as needed for wheezing or shortness of breath.     albuterol (VENTOLIN HFA) 108 (90 Base) MCG/ACT inhaler Inhale 2 puffs into the lungs every 6 (six) hours as needed for wheezing or shortness of breath. 8 g 2   ALPRAZolam (XANAX) 0.5 MG tablet Take 0.5 mg by mouth daily as needed.     Budeson-Glycopyrrol-Formoterol (BREZTRI AEROSPHERE) 160-9-4.8 MCG/ACT AERO Inhale 2 puffs into the lungs in the morning and at bedtime. (Patient not taking: Reported on 04/13/2023) 2 each 0   cetirizine (ZYRTEC) 10 MG tablet Take 10 mg by mouth at bedtime.     EPINEPHrine (AUVI-Q) 0.3 mg/0.3 mL IJ SOAJ injection Inject 0.3 mLs (0.3 mg total) into the muscle as needed for anaphylaxis. 2  each 1   fluticasone (FLONASE) 50 MCG/ACT nasal spray Place 2 sprays into both nostrils daily. (Patient taking differently: Place 2 sprays into both nostrils daily as needed for allergies.) 16 g 5   HYDROcodone bit-homatropine (HYCODAN) 5-1.5 MG/5ML syrup Take 5 mLs by mouth every 6 (six) hours as needed for cough (lung cancer and fibrosis). 240 mL 0   omeprazole (PRILOSEC) 20 MG capsule Take 1 capsule (20 mg total) by mouth 2 (two) times daily. (Needs to be seen before next  refill) 60 capsule 0   OVER THE COUNTER MEDICATION Take 1 capsule by mouth daily. Probiotic     Pirfenidone (ESBRIET) 267 MG TABS Take 3 tablets (801 mg total) by mouth 3 (three) times daily with meals. (Account: [email or web address]) 270 tablet 5   rosuvastatin (CRESTOR) 10 MG tablet Take 10 mg by mouth at bedtime.     umeclidinium-vilanterol (ANORO ELLIPTA) 62.5-25 MCG/ACT AEPB Inhale 1 puff into the lungs daily. 60 each 3   No current facility-administered medications for this visit.    SURGICAL HISTORY:  Past Surgical History:  Procedure Laterality Date   ABDOMINAL HYSTERECTOMY     ADENOIDECTOMY     APPENDECTOMY     BRONCHIAL BIOPSY  12/20/2021   Procedure: BRONCHIAL BIOPSIES;  Surgeon: Josephine Igo, DO;  Location: [facility];  Service: Pulmonary;;   BRONCHIAL BRUSHINGS  12/20/2021   Procedure: BRONCHIAL BRUSHINGS;  Surgeon: Josephine Igo, DO;  Location: [facility];  Service: Pulmonary;;   BRONCHIAL NEEDLE ASPIRATION BIOPSY  12/20/2021   Procedure: BRONCHIAL NEEDLE ASPIRATION BIOPSIES;  Surgeon: Josephine Igo, DO;  Location: [facility];  Service: Pulmonary;;   CESAREAN SECTION     CHOLECYSTECTOMY     COLONOSCOPY  06/10/2012   RMR: Colonic polyps -removed as described above.    FINE NEEDLE ASPIRATION  12/20/2021   Procedure: FINE NEEDLE ASPIRATION (FNA) LINEAR;  Surgeon: Josephine Igo, DO;  Location: [facility];  Service: Pulmonary;;   KNEE ARTHROSCOPY  1973   right knee,  torn cart   LUMBAR DISC SURGERY  05/12/2015   L4 L5   LUMBAR LAMINECTOMY/DECOMPRESSION MICRODISCECTOMY Left 05/12/2015   Procedure: Left L4-5 Microdiscectomy;  Surgeon: Eldred Manges, MD;  Location: [facility];  Service: Orthopedics;  Laterality: Left;   TONSILLECTOMY     VIDEO BRONCHOSCOPY WITH ENDOBRONCHIAL ULTRASOUND Bilateral 12/20/2021   Procedure: VIDEO BRONCHOSCOPY WITH ENDOBRONCHIAL ULTRASOUND;  Surgeon: Josephine Igo, DO;  Location: [facility];  Service: Pulmonary;  Laterality:  Bilateral;   VIDEO BRONCHOSCOPY WITH RADIAL ENDOBRONCHIAL ULTRASOUND  12/20/2021   Procedure: RADIAL ENDOBRONCHIAL ULTRASOUND;  Surgeon: Josephine Igo, DO;  Location: [facility];  Service: Pulmonary;;    REVIEW OF SYSTEMS:  Constitutional: positive for fatigue Eyes: negative Ears, nose, mouth, throat, and face: negative Respiratory: positive for dyspnea on exertion and pleurisy/chest pain Cardiovascular: negative Gastrointestinal: negative Genitourinary:negative Integument/breast: negative Hematologic/lymphatic: negative Musculoskeletal:negative Neurological: negative Behavioral/Psych: negative Endocrine: negative Allergic/Immunologic: negative   PHYSICAL EXAMINATION: General appearance: alert, cooperative, fatigued, and no distress Head: Normocephalic, without obvious abnormality, atraumatic Neck: no adenopathy, no JVD, supple, symmetrical, trachea midline, and thyroid not enlarged, symmetric, no tenderness/mass/nodules Lymph nodes: Cervical, supraclavicular, and axillary nodes normal. Resp: rales bilaterally Back: symmetric, no curvature. ROM normal. No CVA tenderness. Cardio: regular rate and rhythm, S1, S2 normal, no murmur, click, rub or gallop GI: soft, non-tender; bowel sounds normal; no masses,  no organomegaly Extremities: extremities normal, atraumatic, no cyanosis or edema Neurologic: Alert and oriented X 3, normal strength and tone.  Normal symmetric reflexes. Normal coordination and gait  ECOG PERFORMANCE STATUS: 1 - Symptomatic but completely ambulatory  Blood pressure 108/67, pulse 77, temperature 97.8 F (36.6 C), temperature source Temporal, resp. rate 16, height 5\' 5"  (1.651 m), weight 128 lb 8 oz (58.3 kg), SpO2 100%.  LABORATORY DATA: Lab Results  Component Value Date   WBC 5.9 10/02/2023   HGB 11.0 (L) 10/02/2023   HCT 32.3 (L) 10/02/2023   MCV 93.4 10/02/2023   PLT 276 10/02/2023      Chemistry      Component Value Date/Time   NA 138 10/02/2023  1025   NA 139 12/03/2019 0928   K 4.2 10/02/2023 1025   CL 102 10/02/2023 1025   CO2 29 10/02/2023 1025   BUN 11 10/02/2023 1025   BUN 14 12/03/2019 0928   CREATININE 0.64 10/02/2023 1025   CREATININE 0.64 10/14/2021 0953      Component Value Date/Time   CALCIUM 9.7 10/02/2023 1025   ALKPHOS 73 10/02/2023 1025   AST 23 10/02/2023 1025   ALT 17 10/02/2023 1025   BILITOT 0.3 10/02/2023 1025       RADIOGRAPHIC STUDIES: CT Chest W Contrast Result Date: 10/04/2023 CLINICAL DATA:  Staging non-small-cell lung cancer. * Tracking Code: BO * EXAM: CT CHEST WITH CONTRAST TECHNIQUE: Multidetector CT imaging of the chest was performed during intravenous contrast administration. RADIATION DOSE REDUCTION: This exam was performed according to the departmental dose-optimization program which includes automated exposure control, adjustment of the mA and/or kV according to patient size and/or use of iterative reconstruction technique. CONTRAST:  75mL OMNIPAQUE IOHEXOL 300 MG/ML  SOLN COMPARISON:  CT 07/31/2023 and older. FINDINGS: Cardiovascular: Heart is nonenlarged. Trace pericardial fluid is stable. Coronary artery calcifications are seen. The thoracic aorta has a normal course and caliber with scattered partially calcified plaque. Bovine type aortic arch, normal variant. Mediastinum/Nodes: Preserved thyroid gland. The thoracic esophagus has a normal course and caliber. There are no abnormal lymph nodes seen in the axillary regions, thoracic inlet, chest wall or internal mammary chains. Prominent mediastinal nodes are again seen. Specific lesions will be followed such as pretracheal in the upper mediastinum which on the prior has a short axis dimension of 7 mm and today 8 mm on series 2, image 38. Right paratracheal node which measured 9 mm on the prior, today on series 2, image 48 measures 9 mm. Precarinal nodes are again seen as well. On the prior the right-sided focus measured 9 mm and left 8 mm in short  axis. On today's study, image 62 of series 2, these measure 9 mm and 8 mm, respectively. Prominent nodes along the AP window, subcarinal region are also stable when adjusting for technique. Stable enlarged left hilar node as well on series 2, image 79. Lungs/Pleura: Advanced emphysematous changes identified including centrilobular and paraseptal. Greatest in the upper lung zones. Left lung also has areas of interstitial septal thickening, fibrosis and some honeycombing. No left-sided pleural effusion, consolidation or pneumothorax. No new left-sided dominant lung lesion. Stable 5 mm left-sided lung nodule series 6, image 87. There is debris in the trachea and extending into the right main bronchus. Persistent consolidative opacity seen in the central right upper lobe with distortion and bronchiectasis, unchanged from prior. Consolidative opacities also in the superior segment of the left lower lobe has a similar configuration as well. Small loculated right pleural effusion is similar. Stable small right upper lobe nodule measuring 5 mm on series 2 image 36. Upper Abdomen: Adrenal  glands are incompletely included in the imaging field. The left adrenal gland and at the very edge of the imaging field to slightly thickened unchanged from previous. Right adrenal gland is preserved. Musculoskeletal: Scattered degenerative changes along the spine. IMPRESSION: Overall no significant interval change. Stable consolidative opacities in the right lung with distortion and loculated small effusion. Stable small bilateral lung nodules. Several borderline mediastinal and hilar nodes are similar to the most recent prior. Recommend continued surveillance. Aortic Atherosclerosis (ICD10-I70.0) and Emphysema (ICD10-J43.9). Electronically Signed   By: Karen Kays M.D.   On: 10/04/2023 15:41    ASSESSMENT AND PLAN: This is a very pleasant 67  years old white female diagnosed with unresectable stage IIb (T2 a, N1, M0) non-small cell  lung cancer, squamous cell carcinoma with right lower lobe lung mass in addition to right hilar lymphadenopathy in March 2023. The patient has PD-L1 expression of 99%. She completed a course of concurrent chemoradiation with weekly carboplatin for AUC of 2 and paclitaxel 45 Mg/M2 status post 7 cycles.  Last dose was given on 02/27/2022. The patient is currently on observation and she is feeling fine except for the baseline shortness of breath and fatigue. She had repeat CT scan of the chest performed recently.  I personally and independently reviewed the scan and discussed the result with the patient and her husband. Her scan showed no concerning findings for disease recurrence or metastasis.    Unresectable Stage IIB Non-Small Cell Lung Cancer (NSCLC) Diagnosed in March 2023. Treated with concurrent chemoradiation (weekly carboplatin and paclitaxel). Tumor has PD-L1 expression of 99%. Recent scan showed stable disease with no new lymph node enlargement. Reports intermittent chest pain, likely due to muscle spasms from previous radiation. No significant weight loss, nausea, vomiting, or diarrhea. Currently on perphenidone, which may cause some side effects. Discussed the plan to continue monitoring with CT scans every four months, with the goal to eventually extend to a six-month schedule. - Continue monitoring with CT scans every four months - Schedule next CT scan one week to ten days before the next visit - Continue current medications  Chronic Obstructive Pulmonary Disease (COPD) with Emphysema and Pulmonary Fibrosis On 24/7 oxygen therapy at 4 L/min. Attempts to reduce oxygen result in decreased oxygen saturation levels. Symptoms include dyspnea. - Continue 24/7 oxygen therapy at 4 L/min - Monitor for any changes in respiratory status  Follow-up - Schedule follow-up appointment in four months - Perform lab work and CT scan one week to ten days before the next visit.   The patient was advised  to call immediately if she has any other concerning symptoms in the interval. The patient voices understanding of current disease status and treatment options and is in agreement with the current care plan.  All questions were answered. The patient knows to call the clinic with any problems, questions or concerns. We can certainly see the patient much sooner if necessary.  The total time spent in the appointment was 30 minutes.  Disclaimer: This note was dictated with voice recognition software. Similar sounding words can inadvertently be transcribed and may not be corrected upon review.

## 2023-10-22 ENCOUNTER — Telehealth: Payer: Medicare Other | Admitting: Internal Medicine

## 2023-10-22 ENCOUNTER — Encounter: Payer: Self-pay | Admitting: Internal Medicine

## 2023-10-22 DIAGNOSIS — J84112 Idiopathic pulmonary fibrosis: Secondary | ICD-10-CM

## 2023-10-22 DIAGNOSIS — R053 Chronic cough: Secondary | ICD-10-CM

## 2023-10-22 DIAGNOSIS — J9611 Chronic respiratory failure with hypoxia: Secondary | ICD-10-CM

## 2023-10-22 DIAGNOSIS — Z5181 Encounter for therapeutic drug level monitoring: Secondary | ICD-10-CM | POA: Diagnosis not present

## 2023-10-22 DIAGNOSIS — J439 Emphysema, unspecified: Secondary | ICD-10-CM

## 2023-10-22 DIAGNOSIS — C3490 Malignant neoplasm of unspecified part of unspecified bronchus or lung: Secondary | ICD-10-CM

## 2023-10-22 DIAGNOSIS — R0600 Dyspnea, unspecified: Secondary | ICD-10-CM

## 2023-10-22 MED ORDER — HYDROCODONE BIT-HOMATROP MBR 5-1.5 MG/5ML PO SOLN: 5.0000 mL | Freq: Four times a day (QID) | ORAL | 0 refills | Status: DC | PRN

## 2023-10-22 NOTE — Progress Notes (Signed)
 OV 11/13/2022 transfer of care to the ILD center with Dr. Geronimo.  Subjective:  Patient ID: Diane [name], female , DOB: May 06, 1956 , age 68 y.o. , MRN: [id number] , ADDRESS: [address] [place] [address] PCP Diane Leta NOVAK, MD Patient Care Team: Diane Leta NOVAK, MD as PCP - General (Internal Medicine) Diane Lamar HERO, MD as Consulting Physician (Gastroenterology) Diane Rigg, MD (Inactive) as Consulting Physician (Dermatology)  This Provider for this visit: Treatment Team:  Attending Provider: Geronimo Amel, MD    11/13/2022 -   Chief Complaint  Patient presents with   New Patient (Initial Visit)    New pt from [facility] for ILD. Patient filled out packet and for Esbriet  but never started the medication. CT is for April 2024. Pt is just getting over PNA. Still has a slight cough. Just finished prednisone  and Levaquin .      HPI Diane [name] 68 y.o. -presents with her husband Diane [name].  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  She retire and then in the spring 2023 was diagnosed with right lower lobe non-small cell cancer stage II.  She is status post radiation and also chemotherapy.  Currently on observation treatment.  This is based on independent history from the husband and the patient and also review of the external records.  She is understandably quite frustrated with the onset of all these health issues given the fact she just retired.  Then approximately in September 2023 started noticing significant amount of dyspnea on exertion walking the dog and climbing stairs.  At this time desaturations with exercise to be noticed.  She has since been using oxygen  and this helps her.  She is attended cardiac rehabilitation or pulmonary rehabilitation and this also helped her.  She was then given a diagnosis of pulmonary fibrosis/UIP.  Antifibrotic pirfenidone  was started based on her history and also external  record review.  However due to some confusion of the paperwork this has not been started yet.  She is here for evaluation of her ILD.  Visualization of CT scan from 2015 shows presence of possible early ILD even back in 2015 with some honeycombing/emphysema at the base.  There was a craniocaudal gradient back pain.   Also of note she just went to a [place] cruise in mid January 2024.  When she came back on October 28, 2022.  Then on October 30, 2022 she had cough headaches no fever.  Her flu test and COVID was negative.  She is being treated for acute bronchitis with Levaquin  and IM steroids and 6-day prednisone  taper.  She will she was given a diagnosis of pneumonia actually.  She is almost back to baseline but still feels fatigued and still with some cough worse than her baseline.  Integrated Comprehensive ILD Questionnaire  Symptoms:  0 she believes she has had much rapid worsening in the last 6 months only.  Suspect this is after her radiation.  Current symptom scores are as below.   Past Medical History :  -She was told 20 years ago she had hiatal hernia and acid reflux but she says she has had no issues since then.  It is definitely not described in the current CT chest -She is awaiting a sleep study -She had seizures 12 years ago. -She has hypertension controlled on medication -Currently just finished antibiotic and steroid course-has had COVID-vaccine but never had COVID    ROS:  -Tired and does have  dry eyes -Has acid reflux for which she is taking omeprazole   FAMILY HISTORY of LUNG DISEASE:  *-Mother had asthma but there is no family history of lung disease  PERSONAL EXPOSURE HISTORY:   -She smoked between 1973 and 2022.  Smoked 1 pack/day and then quit.  In the 1980s and 1990s very briefly very rarely she did some cocaine.  She smoked some marijuana between 1976 and 2022 very minimal.  HOME  EXPOSURE and HOBBY DETAILS :  [place]   The home is 67 years old.  She is lived there for 19 years.  She recently got her down comforter 3 months ago but otherwise no organic antigen exposure in the house.  Although there is a leaky roof for the last 1 year in 2021 but there is no mold.  She is retired.  She works at [employer] treasure[employer].  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  INVESTIGATIONS: -As below    HRCT dec 2023 -personally visualized and independently agree  COMPARISON:  Chest CT 06/27/2022.   FINDINGS: Cardiovascular: Heart size is normal. There is no significant pericardial fluid, thickening or pericardial calcification. There is aortic atherosclerosis, as well as atherosclerosis of the great vessels of the mediastinum and the coronary arteries, including calcified atherosclerotic plaque in the left main, left anterior descending and right coronary arteries.   Mediastinum/Nodes: No pathologically enlarged mediastinal or left hilar lymph nodes. Soft tissue fullness in the right hilar region concerning for underlying lymphadenopathy, but poorly evaluated on today's noncontrast CT examination (best appreciated on axial image 60 of series 2). Esophagus is unremarkable in appearance. No axillary lymphadenopathy.   Lungs/Pleura: Treated lesion in the right lower lobe is now partially obscured by surrounding parenchymal changes, but is estimated to measure approximately 2.1 x 1.4 cm (axial image 114 of series 4). There has been a dramatic increase in surrounding areas of ground-glass attenuation, septal thickening, bronchiectasis and cystic changes, and regional architectural distortion in the surrounding lung parenchyma, presumably reflective of evolving postradiation changes. Several nodular areas of  architectural distortion are now noted in the right lung, most notably a macrolobulated nodule with spiculated margins in the right upper lobe (axial image 86 of series 4) measuring 1.9 x 1.3 cm, and a slightly ill-defined 1.9 x 1.1 cm nodular area of architectural distortion in the right lower lobe at the base (axial image 185 of series 4). Small right pleural effusion predominantly lying dependently. No left pleural effusion. In the remaining portions of the lungs are widespread areas of septal thickening, subpleural reticulation, traction bronchiectasis, peripheral bronchiolectasis and honeycombing, most evident in the mid to lower lung distribution. Inspiratory and expiratory imaging is unremarkable. There is also a background of mild diffuse bronchial wall thickening with moderate centrilobular and paraseptal emphysema.   Upper Abdomen: Aortic atherosclerosis.  Status post cholecystectomy.   Musculoskeletal: There are no aggressive appearing lytic or blastic lesions noted in the visualized portions of the skeleton.  IMPRESSION: 1. The appearance of the lungs is compatible with study. 2. Evolving postradiation changes in the right lung. Treated nodule in the superior segment of the right lower lobe appears grossly similar to the prior study, while there are new nodules in the right lung, most concerning of which is in the right  upper lobe (axial image 86 of series 4), currently measuring 1.9 x 1.3 cm. Close attention on follow-up studies is recommended. These new areas of nodularity could be treatment related or infectious or inflammatory in etiology, however, neoplasm is not excluded. 3. Mild diffuse bronchial wall thickening with moderate centrilobular and paraseptal emphysema; imaging findings suggestive of underlying COPD. 4. Aortic atherosclerosis, in addition to left main and 2 vessel coronary artery disease. Please note that although the presence of coronary artery  calcium  documents the presence of coronary artery disease, the severity of this disease and any potential stenosis cannot be assessed on this non-gated CT examination. Assessment for potential risk factor modification, dietary therapy or pharmacologic therapy may be warranted, if clinically indicated.   Aortic Atherosclerosis (ICD10-I70.0) and Emphysema (ICD10-J43.9).     Electronically Signed   By: Toribio Aye M.D.   On: 09/27/2022 07:43   Telephone with SAra 12/13/22 tient called the office 12/13/2022  to get her sleep study results.  While I was talking with the patient she was short of breath and shared that she had increased oxygen  demands. She said this has been going on for several weeks since she returned from  a cruise she took with friends to celebrate finishing chemo and radiation . This was a [place] cruise in January 2024. She returned October 28, 2022. Then on October 30, 2022 she had cough headaches no fever. Her flu test and COVID was negative. She was treated for acute bronchitis and pneumonia  with Levaquin  and IM steroids and 6-day prednisone  taper.  She was feeling better but still fatigued and had a and still with some cough worse than her baseline  when she saw Dr. Geronimo  11/13/2022.  I ordered a CXR at [facility] (Closer to patient ).I also ordered a home tank with a higher liter flow capacity as her oxygen  demand was so much higher.. CXR Results 2/28 confirmed >>  Diffuse bilateral interstitial pulmonary opacity, increased compared to prior examination, consistent with edema or infection. No focal airspace opacity. Unchanged post treatment appearance of the right midlung.I called the patient with the results of her scan and sent in prescriptions for Doxycycline  100 mg BID x 7 days and a prednisone  taper Prednisone  taper; 10 mg tablets: 4 tabs x 2 days, 3 tabs x 2 days, 2 tabs x 2 days 1 tab x 2 days then stop. She has a penicillin allergy , and history of QT  prolongation .  I  asked her to start Mucinex  1200 mg each morning with a glass of water  and start using her flutter valve. She states her secretions are thick and hard to get up. I have asked her to use her albuterol  nebs in the morning and evening while she is sick, and to wear her oxygen  at bedtime at 2  liters. When I asked if she has had a fever, she said she did not think so, but when I asked if she had chills , she said she has had a lot of couch time covered in blankets, so endorses that she may have had fever. I asked Diane [name] to check in with me Friday 12/15/2022 to ensure she was improving. We discussed that if she does not start turning around soon, we may need to consider IV antibiotics. We discussed if she gets worse , not better to call the office  or seek emergency care.  I reviewed her last echo from 11/15/2022, which shows EF of 60-65%, LV normal function, Left  ventricular diastolic parameters are consistent with Grade I diastolic  dysfunction. She denied any lower extremity edema. She does have a follow up CT Chest scheduled 01/25/2023 per oncology,I plan to  order one earlier if she does not improve.    12/20/2022 -office with Lauraine Lites  Pt. Presents for short term follow up of multifocal pneumonia noted on CXR 12/13/2022. She was treated with Doxycycline  and prednisone  taper 12/13/2022 for her slow to resolve pneumonia . She states she is feeling much better. She states it is like night and day. She states she is back to her baseline. She is using 3 L [place] at rest and 4 L with exertion. Her oxygen  demand is back to her baseline and she has her energy back. She does still have a cough, which is productive . She coughed in the office and she had some blood streaks in it. She states this is new, and she thinks it is from coughing. Secretions are mostly clear with some tan bits. She denies fever or chest pain. She has completed the Doxycycline  and has her last dose of prednisone  tomorrow. I will give her  an additional 7 days of 10 mg prednisone  daily to cover her until she sees Dr. Geronimo next  week.  She started perfididone Monday 12/18/2022. I have told her to let us  know if she has any stomach upset.  She is much more compliant with her oxygen  use. She had her oxygen  on today in the office. She is monitoring her oxygen  levels. She is compliant with her Anoro daily. She is using her albuterol  as needed. She has been using her Mucinex  with flutter valve for secretion mobilization.   She does have headaches, which I think are from her cough. I have prescribed her cough medication with Codeine to see if that resolves the headaches.   She is still waiting in her CPAP machine and her 10 L home oxygen  concentrator. My nurse will check with [employer] about the delay.      12/26/2022 -   Chief Complaint  Patient presents with   Follow-up    PFT F/up   r  HPI Diane [name] 68 y.o. -returns for follow-up.  She presents with her husband.  She tells me that after she saw me she got the prednisone  taper and felt transiently better but then when she had a phone call with Lauraine Schultze it was noticed that she was quite symptomatic.  She was then called in for an acute visit and given 7-10 days of daily prednisone  along with an antibiotic.  This then has helped her come back to baseline.  She feels she is currently at baseline.  At the time it was noticed that she was desaturating easily and she needed significant amount of oxygen  to correct.  Her home oxygen  concentrator only go up to 5 L.  Therefore we did a oxygen  titration test today and she actually needs 8-10 L to correct and walk our standard 3 laps in the office.  Nevertheless she feels closer to baseline.  She still has a lot of fatigue and shortness of breath and she wants to improve on this.  She is open and willing to attend pulmonary rehabilitation.  Apparently referral has been made by me and by nurse practitioner but she still has not heard.   I did indicate to her we will make another referral again.  She also told me that she is now touch base with Prentice Sluder the support group  chair and she is enjoying those conversations.  She had pulmonary function test that compared to a year ago shows significant decline at least 16% and the FVC decline in a year.  She just started Esbriet  for this and as of yesterday has gone to 3 pills 3 times daily.  She is to have an echo and pulmonary artery pressures are reported as normal  Other than fatigue which she does not believe is due to pirfenidone  she is actually tolerating the pirfenidone  quite well.    02/06/2023 Pt. Presents for follow up 6 weeks after seeing Dr. Geronimo. She was started on Esbriet  10/31/2022.She initially was  Unable tolerate 3 tabs TID ( vomiting and constant  nausea). She dropped the dose in March to 2 tabs TID, and then tried to increase to 3 tabs daily again and was nauseated with weight loss of 5 pounds in 1 week. She dropped her dose back to 2 tablets TID  on  02/04/2023  and she is able to tolerate this dose. She is tolerating the lower dose well, already feels better after 2 days on the lower dose. She has been able to tolerate 2 tablets three times daily.We will hold at this dose for now until she sees Dr. Geronimo again in 6 weeks. LFT's drawn at the cancer center 01/25/2023 show  AST of 16, ALT if 11, Alk Phos of 64 and total bili of 0.3, so all WNL.She states she is no longer drinking wine , which is most likely helping maintain normal hepatic labs. Her CBC did show some anemia. HGB of 11.3 with a WBC of 7.5. This is about a  1.3 gram drop over previous HGB which was 12.6 four months ago. She has been started on Iron by Dr. Sherrod, and she has follow up labs placed by Dr.Mohamed  Marshe looks great today. No fever, no change in secretions, no change in shortness of breath. She feels she is at her baseline. Current oxygen  use is 4L [place] at rest and 6 L with exertion. She is  wearing her oxygen  at all times, and she maintains saturations > 92%. She is not using her CPAP with everything she has going on but she is wearing her oxygen  at bedtime. She does have a POC that her husband  purchased without insurance , and she uses this when driving in the car, as it meets her at rest oxygen  needs.  She is doing well at pulmonary rehab. She comes to the [facility] rehab office and she feels she is getting a tremendous benefit from this. She is also meeting other ILD patient's and gets comfort and support from them.   There were some changes on her surveillance CT Chest  01/27/2023. Her previous CT scan in December 2023 showed finding compatible with a combination of emphysema and interstitial lung disease which has been progressive.  She also had evolving postradiation changes in the right lung and the treated nodule in the superior segment of the right lower lobe appeared grossly similar but there were new nodules within the right lung most concerning of which is in the right upper lobe and currently measures 1.9 x 1.3 cm requiring close monitoring.  She had a PET scan performed in early January 2024 and it showed no concerning findings for disease progression,  in the area of concern showed moderate metabolic activity that are nonspecific and may be related to infectious or inflammatory process but neoplasm could not be completely excluded. She has follow up  CT ordered for 3 months which will be July 2024. She knows to call immediately for any concerning symptoms or changes in her breathing.   We are doing a therapeutic trial with Breztri . She is currently on Anoro which she states does not seem to help with her breathing. We discussed that there is a steroid component to the Breztri  which can increase risk of pneumonia. She knows to stop using inhaler and call to be seen if she has any changes in her breathing status. I have also prescribed an albuterol  inhaler as she does have episodes  of what sounds like upper airway wheezing.   We need to continue to monitor for weight loss. On the Esbriet  3 tabs TID she lost a significant amount of weight. We will re-evaluate her weight upon follow up with Dr. Geronimo in 6 weeks on the 2 tablets TID.  She will need repeat LFT's in 4-6 weeks , She sees Dr. Chiquita 03/22/2023.      Test Results: CT Chest with contrast 01/27/2023 Slight increase in focal opacity involving the superior segment right lower lobe with increasing pleural thickening and loculated small pleural fluid.   The previously seen hypermetabolic nodules in the right upper lobe and lower lobe separately are again noted today in our appear relatively similar when adjusting for technique.   Persistent soft tissue fullness of the right lung hilum as well as some prominent nodes in the mediastinum and left hilum.   Underlying advanced emphysematous lung changes.   Aortic Atherosclerosis (ICD10-I70.0) and Emphysema (ICD10-J43.9).   OV 03/22/2023  Subjective:  Patient ID: Diane [name], female , DOB: Sep 21, 1956 , age 40 y.o. , MRN: [id number] , ADDRESS: [address] [place] [address] PCP Diane Leta NOVAK, MD Patient Care Team: Diane Leta NOVAK, MD as PCP - General (Internal Medicine) Diane, Lamar HERO, MD as Consulting Physician (Gastroenterology) Diane Rigg, MD (Inactive) as Consulting Physician (Dermatology)  This Provider for this visit: Treatment Team:  Attending Provider: Geronimo Amel, MD  #IPF/UIP pattern #Associated pulmonary emphysema #Combined pulmonary fibrosis emphysema  -Chronic green sputum #Lung cancer #DNR , ECOG 3-4, Class 4 dyspnea  03/22/2023 -   Chief Complaint  Patient presents with   Follow-up    PFT:03/22/23 O2 4l     HPI Diane [name] 69 y.o. -returns for follow-up.  Since her last visit her symptoms have deteriorated.  Pulmonary function test also shows a steep decline in both FVC and particularly with  DLCO.  She tells me that ever since January 2024 she is having a steady decline but more so in the last 1 month.  And send the last 1 month her shower and getting dressed time has gotten worse.  She used to be able to get ready within 30 minutes now she is requiring 1 hour.  However at pulmonary rehabilitation the max oxygen  she is required has been 8-10 L [4 L at rest] but 2 days ago abruptly she required 15 L and I was alerted about it.  She is denying any hemoptysis or fever or chills.  She has chronic green sputum but this is not change.  There is no worsening of cough there is no edema.  She is not on any anticoagulation.  In addition she is also noticing abrupt spasms of breathing for the last 1 month is almost a stridorous noise.  She was sucking air and it is loud it happens spontaneously.  Happens around 20 times a day.  Her husband comes rushing  from the next room.  But she states her pulse ox remained stable.  Correlating with all this is the pulmonary function test to significantly decline.  Also CT scan of the chest in April 2024 that I personally visualized is significantly worse.  Dr. Sherrod is doing another CT scan in July 2024.  Today she went to her primary care physician at [facility] but I do not have the results she showed me lipid results but she believes the liver function test was normal.  She says that she has been given a life expectancy of less than 1 year she is upset about this.  But she says she is also realistic.  She says she has advanced directives she is working on it.  She is agreed to be a DNR.  Symptom wise significant cough is there but dyspnea is even worse.  She is willing to get Hycodan to help both symptoms  Overall with her quality of life and decline while she is looking for the reversible and looking to remain as healthy and as alive as possible she is also willing to be seen by palliative care at home.  We did a goals of care discussion about all this.  Filled out [employer] emergency backup electricity form.  Also filled out handicap placard form.    OV 04/13/2023  Subjective:  Patient ID: Diane [name], female , DOB: [birth year] , age 52 y.o. , MRN: [id number] , ADDRESS: [address] [place] [address] PCP Diane Leta NOVAK, MD Patient Care Team: Diane Leta NOVAK, MD as PCP - General (Internal Medicine) Diane Lamar HERO, MD as Consulting Physician (Gastroenterology) Diane Rigg, MD (Inactive) as Consulting Physician (Dermatology)  This Provider for this visit: Treatment Team:  Attending Provider: Geronimo Amel, MD  Type of visit: Video Virtual Visit Identification of patient Diane [name] with 12/21/1955 and MRN [id number] - 2 person identifier Risks: Risks, benefits, limitations of telephone visit explained. Patient understood and verbalized agreement to proceed Anyone else on call: husband Patient location: her home This provider location: [address]; [place]; [place] [address]. [facility]. [phone]    04/13/2023 -   Chief Complaint  Patient presents with   Follow-up    F/up discuss intervenous steroids and discuss some other medical issues.     HPI Diane [name] 68 y.o. - preents with husband on video visit . Has been betting worse. . IN interim, ABG without hypercapnia. CTA ruled out PE but showed worsning RLL nodule. Has known post XRT consolidation in RLL. No ILD flare up. Saw onc and rad onc. Notes reviewed. Advised SBRT mid uly x 5 sessions. She is worriued about ILD flare with XRT. But she is on pirfenidone . Wants me clearance pripr to SBRT and also if neeed to do high dose steroids    CTA 6/10;/24  Narrative & Impression  CLINICAL DATA:  Worsening shortness of breath and chest discomfort 3 weeks. Elevated D-dimer. Lung carcinoma. Undergoing chemotherapy. * Tracking Code: BO *   EXAM: CT ANGIOGRAPHY CHEST WITH CONTRAST   TECHNIQUE: Multidetector CT imaging of the  chest was performed using the standard protocol during bolus administration of intravenous contrast. Multiplanar CT image reconstructions and MIPs were obtained to evaluate the vascular anatomy.   RADIATION DOSE REDUCTION: This exam was performed according to the departmental dose-optimization program which includes automated exposure control, adjustment of the mA and/or kV according to patient size and/or use of iterative reconstruction technique.   CONTRAST:  75mL OMNIPAQUE  IOHEXOL  350 MG/ML SOLN   COMPARISON:  01/25/2023   FINDINGS: Cardiovascular: Satisfactory opacification of pulmonary arteries noted, and no pulmonary emboli identified. No evidence of thoracic aortic dissection or aneurysm. Aortic and coronary atherosclerotic calcification incidentally noted.   Mediastinum/Nodes: No masses or pathologically enlarged lymph nodes identified.   Lungs/Pleura: Centrilobular and paraseptal emphysema again noted. Chronic changes of subpleural fibrosis and honeycombing again seen with bibasilar predominance. Areas of confluent opacity and central bronchiectasis are again seen in the right upper and lower lobes, without significant change. A poorly defined nodular opacity in the posterior right lower lobe on image 95/5 currently measures 1.9 x 1.8 cm, compared to 1.2 x 1.0 cm previously. No other new or enlarging sites of pulmonary opacity identified. No evidence of pleural effusion.   Upper abdomen: No acute findings.   Musculoskeletal: No suspicious bone lesions identified.   Review of the MIP images confirms the above findings.   IMPRESSION: No evidence of pulmonary embolism.   Increased size of 1.9 cm nodular opacity in posterior right lower lobe, suspicious for carcinoma.   No significant change in areas of right lung airspace disease and bronchiectasis, likely due to post radiation changes.   Chronic pulmonary emphysema and fibrosis.   Aortic Atherosclerosis  (ICD10-I70.0) and Emphysema (ICD10-J43.9).     Electronically Signed   By: Norleen DELENA Kil M.D.   On: 03/26/2023 16:10      NM PET scan 04/05/23  Narrative & Impression  CLINICAL DATA:  Subsequent treatment strategy for lung carcinoma. Chemotherapy ongoing. Concern for increasing RIGHT lower lobe nodule.   EXAM: NUCLEAR MEDICINE PET SKULL BASE TO THIGH   TECHNIQUE: 7.47 mCi F-18 FDG was injected intravenously. Full-ring PET imaging was performed from the skull base to thigh after the radiotracer. CT data was obtained and used for attenuation correction and anatomic localization.   Fasting blood glucose: 110 mg/dl   COMPARISON:  CT 93/89/7975 com PET-CT 10/04/2022   FINDINGS: Mediastinal blood pool activity: SUV max 2.0   Liver activity: SUV max NA   NECK: No hypermetabolic lymph nodes in the neck.   Incidental CT findings: None.   CHEST: Rounded focus of intense metabolic activity in the RIGHT lower lobe measures 2.7 cm with SUV max equal 15.4. This corresponds to nodule of concern on comparison CT.   Band of parenchymal consolidation in the more superior RIGHT lower lobe has mild metabolic activity favored post radiation change. Band parenchymal consolidation in the RIGHT upper lobe is also similar mild metabolic activity.   No hypermetabolic mediastinal lymph nodes.   Incidental CT findings: None.   ABDOMEN/PELVIS: No abnormal hypermetabolic activity within the liver, pancreas, adrenal glands, or spleen. No hypermetabolic lymph nodes in the abdomen or pelvis.   Incidental CT findings: None.   SKELETON: No focal hypermetabolic activity to suggest skeletal metastasis.   Incidental CT findings: None.   IMPRESSION: 1. Persistent pulmonary nodule in the inferior RIGHT lower lobe with intense metabolic activity is concerning for lung cancer recurrence. 2. Consolidation in the superior aspect of the RIGHT lower lobe and RIGHT upper lobe is similar to prior and  most consistent with post radiation change. 3. No hypermetabolic mediastinal lymph nodes. 4. No distant metastatic disease.     Electronically Signed   By: Jackquline Boxer M.D.   On: 04/09/2023 10:22     OV 08/07/2023  Subjective:  Patient ID: Diane [name], female , DOB: Sep 25, 1956 , age 30 y.o. , MRN: [id number] , ADDRESS: [address] [place] [address] PCP Diane Leta NOVAK, MD Patient Care Team: Diane Leta NOVAK, MD as PCP - General (Internal Medicine) Diane Lamar HERO, MD as Consulting Physician (Gastroenterology) Diane Rigg, MD (Inactive) as Consulting Physician (Dermatology)  This Provider for this visit: Treatment Team:  Attending Provider: Geronimo Amel, MD    08/07/2023 -   Chief Complaint  Patient presents with   Follow-up    Ct f/u,      HPI Diane [name] 68 y.o. -returns for follow-up.  Here with husband was independent historian he says he is doing she is doing well for her combined pulmonary fibrosis and emphysema in the setting of lung cancer.  It is now 3 months since she got radiation.  Confirmed on external medical record review.  Overall she is leading a stable life from a respiratory standpoint this can be evidence in the symptom score below.  She uses 4 L nasal cannula at rest and 8-10 L with oxygen .  She puts oxygen  on a backpack and walks her dog.  She is able to do a third of a mile without stopping.  Today her pulse ox on room air at rest was 75% and she did feel a little winded.  She is able to do her activities of daily living.  She had a CT scan of the chest that I personally visualized and it looks stable.  She has had multiple CT scans of the chest she is going to see oncology next today.  Her cough is controlled with the help of opioids.  I noticed that she not taking any inhalers because it was expensive.  Give her samples of Spiriva to try.  Lab results from October 2024 show improved anemia normal liver function test.  In the  summer 2024 BNP was normal.  Social - Husband suffered stroke and had carotid endarterectomy.  He is here with her today.  And currently is doing well. - She will have high-dose flu shot today.  - Esbriet /Pirfenidone  requires intensive drug monitoring due to high concerns for Adverse effects of , including  Drug Induced Liver Injury, significant GI side effects that include but not limited to Diarrhea, Nausea, Vomiting,  and other system side effects that include Fatigue, headaches, weight loss and other side effects such as skin rash. These will be monitored with  blood work such as LFT initially once a month for 6 months and then quarterly  CT Chest data from date: 08/06/23  - personally visualized and independently interpreted : yes - my findings are: as below  rrative & Impression  CLINICAL DATA:  Non-small cell lung cancer. Assess treatment response. * Tracking Code: BO *   EXAM: CT CHEST WITH CONTRAST   TECHNIQUE: Multidetector CT imaging of the chest was performed during intravenous contrast administration.   RADIATION DOSE REDUCTION: This exam was performed according to the departmental dose-optimization program which includes automated exposure control, adjustment of the mA and/or kV according to patient size and/or use of iterative reconstruction technique.   CONTRAST:  75mL OMNIPAQUE  IOHEXOL  300 MG/ML  SOLN   COMPARISON:  Chest CT 05/18/2023 and prior PET-CT 04/13/2023   FINDINGS: Cardiovascular: The heart is normal in size. No pericardial effusion. Scattered fluid in the pericardial recesses. Stable aortic and branch vessel calcifications but no dissection or aneurysm.   Mediastinum/Nodes: Stable borderline enlarged upper mediastinal lymph nodes. 7 mm node on image 34/2 is stable. Right paratracheal node on image 43/2 measures 9 mm and appears new. 9 mm  precarinal lymph node on image 58/2 is stable. 8 mm AP window node on the same image is also stable. 15 mm  subcarinal node on image 67/2 previously measured 10 mm.   Lungs/Pleura: Stable severe underlying lung disease with bullous emphysema and pulmonary scarring. Overall stable extensive radiation changes involving the right lung with extensive fibrosis and consolidation and bronchiectasis.   The right lower lobe pulmonary nodule is surrounded by soft tissue density, likely radiation change. Difficult to measure a discrete lesion. No new pulmonary nodules are identified.   Upper Abdomen: No significant upper abdominal findings. Stable vascular disease. Status post cholecystectomy.   Musculoskeletal: No breast masses, supraclavicular or axillary adenopathy. No lytic or sclerotic bone lesions are identified.   IMPRESSION: 1. Stable extensive radiation changes involving the right lung. The right lower lobe pulmonary nodule is surrounded by soft tissue density, likely radiation change. Difficult to measure a discrete lesion. No new pulmonary lesions. 2. Stable severe underlying emphysematous changes and pulmonary scarring. 3. New, slightly larger and stable mediastinal lymph nodes as above. 4. No findings for upper abdominal metastatic disease.   Aortic Atherosclerosis (ICD10-I70.0) and Emphysema (ICD10-J43.9).     Electronically Signed   By: MYRTIS Stammer M.D.   On: 08/06/2023 16:46    OV 10/22/2023  Subjective:  Patient ID: Diane [name], female , DOB: 08-29-1956 , age 27 y.o. , MRN: [id number] , ADDRESS: [address] [place] [address] PCP Diane Leta NOVAK, MD Patient Care Team: Diane Leta NOVAK, MD as PCP - General (Internal Medicine) Diane Lamar HERO, MD as Consulting Physician (Gastroenterology) Diane Rigg, MD (Inactive) as Consulting Physician (Dermatology)  This Provider for this visit: Treatment Team:  Attending Provider: Geronimo Amel, MD    10/22/2023 -   Chief Complaint  Patient presents with   Follow-up    Review CT Chest done 09/25/23. Breathing  is stable. Denies any new co's.    Type of visit: Video Virtual Visit Identification of patient Diane [name] with 03/02/1956 and MRN [id number] - 2 person identifier Risks: Risks, benefits, limitations of telephone visit explained. Patient understood and verbalized agreement to proceed Anyone else on call: husband Patient location: her hime This provider location: [address], [address]; [place]; [place] [address]. [facility]. [phone]   HPI Diane [name] 68 y.o. -on this video visit patient says she is actually doing around the same at 4 L oxygen  at rest and 10 L taking a shower but she does feel more short of breath with exertion she is no longer walking the dog for the last 1 month.  She does not think this is weather related.  She is taking her pirfenidone  at 2 pills 3 times a day [low-dose protocol] and is tolerating it well.  Liver function test recently was normal in December 2024.  She continues to have cough and I did do a refill for her Hycodan.  Conversation revolved around her recent CT scan.  She is getting CT scans every 2 months.  To me it looks stable except that she has ongoing radiation pneumonitis compared to it over a year ago.  6 months ago her ESR was high.  We discussed briefly that if the ESR persists perhaps we could try a course of steroids.  She is open to this idea.  We resolved that we will do a pulmonary function test in the next 8 weeks and reassess.  In addition it appears on the CT scan that I personally visualized  that the lung cancer itself is under remission.  Therefore we entertain the idea of evaluating for pulmonary hypertension.  Will get a BNP and echocardiogram and then decide on right heart catheterization which could be offered through research protocol [Dr. Hunsucker PI] versus standard of care.  She is open to the research protocol idea.  Will get an echocardiogram and BNP.    SYMPTOM SCALE - ILD 11/13/2022 03/22/2023 4L  [place] rest, 10-15L [place] exertiona 08/07/2023 75% on room air at rest.  Uses 4 L nasal cannula at rest and 8-10 L with exercise.4  Current weight     O2 use Exertional walking the dog.    Shortness of Breath 0 -> 5 scale with 5 being worst (score 6 If unable to do)    At rest 0 0 4  Simple tasks - showers, clothes change, eating, shaving 3 4 3   Household (dishes, doing bed, laundry) 3 4 3   Shopping 2 4 3   Walking level at own pace 4 4 3   Walking up Stairs 4 5 4   Total (30-36) Dyspnea Score 16 21 17       Non-dyspnea symptoms (0-> 5 scale) 11/13/2022 03/22/2023  08/07/2023   How bad is your cough? 2 -cough is better after quitting smoking 2 3  How bad is your fatigue 2 4 3   How bad is nausea 0 2 0  How bad is vomiting?  0 0 0  How bad is diarrhea? 0 0 0  How bad is anxiety? 3 2 3   How bad is depression 1 2 3   Any chronic pain - if so where and how bad 0 x     Simple office walk 185 feet x  3 laps goal with forehead probe 12/26/2022  12/26/2022   O2 used RA   Number laps completed 1/2 lap of 1 ap   Comments about pace normal   Resting Pulse Ox/HR 94% and 90/min   Final Pulse Ox/HR 87% and 90/min   Desaturated </= 88% yes   Desaturated <= 3% points yes   Got Tachycardic >/= 90/min yes   Symptoms at end of test dyspnea   Miscellaneous comments Waked on 5L [place] - 96% sitting -> still deats Needed 8-10L to correct and walk all 3 laps   Personally visualized the CT scan below. rrative & Impression  CLINICAL DATA:  Staging non-small-cell lung cancer. * Tracking Code: BO *   EXAM: CT CHEST WITH CONTRAST   TECHNIQUE: Multidetector CT imaging of the chest was performed during intravenous contrast administration.   RADIATION DOSE REDUCTION: This exam was performed according to the departmental dose-optimization program which includes automated exposure control, adjustment of the mA and/or kV according to patient size and/or use of iterative reconstruction technique.   CONTRAST:   75mL OMNIPAQUE  IOHEXOL  300 MG/ML  SOLN   COMPARISON:  CT 07/31/2023 and older.   FINDINGS: Cardiovascular: Heart is nonenlarged. Trace pericardial fluid is stable. Coronary artery calcifications are seen. The thoracic aorta has a normal course and caliber with scattered partially calcified plaque. Bovine type aortic arch, normal variant.   Mediastinum/Nodes: Preserved thyroid  gland. The thoracic esophagus has a normal course and caliber.   There are no abnormal lymph nodes seen in the axillary regions, thoracic inlet, chest wall or internal mammary chains. Prominent mediastinal nodes are again seen. Specific lesions will be followed such as pretracheal in the upper mediastinum which on the prior has a short axis dimension of 7 mm and today 8 mm  on series 2, image 38. Right paratracheal node which measured 9 mm on the prior, today on series 2, image 48 measures 9 mm. Precarinal nodes are again seen as well. On the prior the right-sided focus measured 9 mm and left 8 mm in short axis. On today's study, image 62 of series 2, these measure 9 mm and 8 mm, respectively. Prominent nodes along the AP window, subcarinal region are also stable when adjusting for technique. Stable enlarged left hilar node as well on series 2, image 79.   Lungs/Pleura: Advanced emphysematous changes identified including centrilobular and paraseptal. Greatest in the upper lung zones. Left lung also has areas of interstitial septal thickening, fibrosis and some honeycombing. No left-sided pleural effusion, consolidation or pneumothorax. No new left-sided dominant lung lesion. Stable 5 mm left-sided lung nodule series 6, image 87.   There is debris in the trachea and extending into the right main bronchus. Persistent consolidative opacity seen in the central right upper lobe with distortion and bronchiectasis, unchanged from prior. Consolidative opacities also in the superior segment of the left lower lobe has  a similar configuration as well. Small loculated right pleural effusion is similar. Stable small right upper lobe nodule measuring 5 mm on series 2 image 36.   Upper Abdomen: Adrenal glands are incompletely included in the imaging field. The left adrenal gland and at the very edge of the imaging field to slightly thickened unchanged from previous. Right adrenal gland is preserved.   Musculoskeletal: Scattered degenerative changes along the spine.   IMPRESSION: Overall no significant interval change.   Stable consolidative opacities in the right lung with distortion and loculated small effusion. Stable small bilateral lung nodules.   Several borderline mediastinal and hilar nodes are similar to the most recent prior. Recommend continued surveillance.   Aortic Atherosclerosis (ICD10-I70.0) and Emphysema (ICD10-J43.9).     Electronically Signed   By: Ranell Bring M.D.   On: 10/04/2023 15:41     PFT     Latest Ref Rng & Units 03/21/2023    2:53 PM 12/26/2022    1:53 PM 01/04/2022    9:51 AM  ILD indicators  FVC-Pre L 1.85  2.10  2.54   FVC-Predicted Pre % 54  64  76   FVC-Post L  2.11  2.69   FVC-Predicted Post %  64  81   TLC L  3.68  3.94   TLC Predicted %  70  75   DLCO uncorrected ml/min/mmHg 2.82  6.99  9.15   DLCO UNC %Pred % 13  34  44   DLCO Corrected ml/min/mmHg 3.04  6.99  9.15   DLCO COR %Pred % 14  34  44       LAB RESULTS last 96 hours No results found.  LAB RESULTS last 90 days Recent Results (from the past 2160 hours)  CMP (Cancer Center only)     Status: Abnormal   Collection Time: 07/31/23  9:45 AM  Result Value Ref Range   Sodium 137 135 - 145 mmol/L   Potassium 4.2 3.5 - 5.1 mmol/L   Chloride 101 98 - 111 mmol/L   CO2 29 22 - 32 mmol/L   Glucose, Bld 80 70 - 99 mg/dL    Comment: Glucose reference range applies only to samples taken after fasting for at least 8 hours.   BUN 12 8 - 23 mg/dL   Creatinine 9.18 9.55 - 1.00 mg/dL   Calcium  10.0  8.9 - 10.3 mg/dL  Total Protein 8.8 (H) 6.5 - 8.1 g/dL   Albumin 4.3 3.5 - 5.0 g/dL   AST 22 15 - 41 U/L   ALT 16 0 - 44 U/L   Alkaline Phosphatase 94 38 - 126 U/L   Total Bilirubin 0.3 0.3 - 1.2 mg/dL   GFR, Estimated >39 >39 mL/min    Comment: (NOTE) Calculated using the CKD-EPI Creatinine Equation (2021)    Anion gap 7 5 - 15    Comment: Performed at [facility], [address], [place], [place] [address]  CBC with Differential (Cancer Center Only)     Status: Abnormal   Collection Time: 07/31/23  9:45 AM  Result Value Ref Range   WBC Count 6.0 4.0 - 10.5 K/uL   RBC 3.76 (L) 3.87 - 5.11 MIL/uL   Hemoglobin 11.6 (L) 12.0 - 15.0 g/dL   HCT 64.4 (L) 63.9 - 53.9 %   MCV 94.4 80.0 - 100.0 fL   MCH 30.9 26.0 - 34.0 pg   MCHC 32.7 30.0 - 36.0 g/dL   RDW 87.5 88.4 - 84.4 %   Platelet Count 277 150 - 400 K/uL   nRBC 0.0 0.0 - 0.2 %   Neutrophils Relative % 69 %   Neutro Abs 4.1 1.7 - 7.7 K/uL   Lymphocytes Relative 16 %   Lymphs Abs 1.0 0.7 - 4.0 K/uL   Monocytes Relative 9 %   Monocytes Absolute 0.5 0.1 - 1.0 K/uL   Eosinophils Relative 5 %   Eosinophils Absolute 0.3 0.0 - 0.5 K/uL   Basophils Relative 0 %   Basophils Absolute 0.0 0.0 - 0.1 K/uL   Immature Granulocytes 1 %   Abs Immature Granulocytes 0.04 0.00 - 0.07 K/uL    Comment: Performed at [facility], [address], [place], [place] [address]  CMP (Cancer Center only)     Status: None   Collection Time: 10/02/23 10:25 AM  Result Value Ref Range   Sodium 138 135 - 145 mmol/L   Potassium 4.2 3.5 - 5.1 mmol/L   Chloride 102 98 - 111 mmol/L   CO2 29 22 - 32 mmol/L   Glucose, Bld 83 70 - 99 mg/dL    Comment: Glucose reference range applies only to samples taken after fasting for at least 8 hours.   BUN 11 8 - 23 mg/dL   Creatinine 9.35 9.55 - 1.00 mg/dL   Calcium  9.7 8.9 - 10.3 mg/dL   Total Protein 7.9 6.5 - 8.1 g/dL   Albumin 4.1 3.5 - 5.0 g/dL   AST 23 15 - 41  U/L   ALT 17 0 - 44 U/L   Alkaline Phosphatase 73 38 - 126 U/L   Total Bilirubin 0.3 <1.2 mg/dL   GFR, Estimated >39 >39 mL/min    Comment: (NOTE) Calculated using the CKD-EPI Creatinine Equation (2021)    Anion gap 7 5 - 15    Comment: Performed at [facility], [address], [place], [place] [address]  CBC with Differential (Cancer Center Only)     Status: Abnormal   Collection Time: 10/02/23 10:25 AM  Result Value Ref Range   WBC Count 5.9 4.0 - 10.5 K/uL   RBC 3.46 (L) 3.87 - 5.11 MIL/uL   Hemoglobin 11.0 (L) 12.0 - 15.0 g/dL   HCT 67.6 (L) 63.9 - 53.9 %   MCV 93.4 80.0 - 100.0 fL   MCH 31.8 26.0 - 34.0 pg   MCHC  34.1 30.0 - 36.0 g/dL   RDW 87.9 88.4 - 84.4 %   Platelet Count 276 150 - 400 K/uL   nRBC 0.0 0.0 - 0.2 %   Neutrophils Relative % 67 %   Neutro Abs 4.0 1.7 - 7.7 K/uL   Lymphocytes Relative 17 %   Lymphs Abs 1.0 0.7 - 4.0 K/uL   Monocytes Relative 9 %   Monocytes Absolute 0.5 0.1 - 1.0 K/uL   Eosinophils Relative 5 %   Eosinophils Absolute 0.3 0.0 - 0.5 K/uL   Basophils Relative 1 %   Basophils Absolute 0.0 0.0 - 0.1 K/uL   Immature Granulocytes 1 %   Abs Immature Granulocytes 0.03 0.00 - 0.07 K/uL    Comment: Performed at [facility], [address] [place], [place] [address]         has a past medical history of Anxiety, Arthritis, C. difficile diarrhea (2015), COPD (chronic obstructive pulmonary disease) (HCC), Depression, Eczema, H pylori ulcer, Headache(784.0), Hypercholesterolemia, Hypertension, Interstitial lung disease (HCC), Kidney stones, lung ca with recurrence (10/2021), Pneumonia, Seizures (HCC), and Shingles.   reports that she quit smoking about 2 years ago. Her smoking use included cigarettes. She started smoking about 32 years ago. She has a 15 pack-year smoking history. She has never used smokeless tobacco.  Past Surgical History:  Procedure Laterality Date   ABDOMINAL HYSTERECTOMY      ADENOIDECTOMY     APPENDECTOMY     BRONCHIAL BIOPSY  12/20/2021   Procedure: BRONCHIAL BIOPSIES;  Surgeon: Brenna Adine CROME, DO;  Location: [facility];  Service: Pulmonary;;   BRONCHIAL BRUSHINGS  12/20/2021   Procedure: BRONCHIAL BRUSHINGS;  Surgeon: Brenna Adine CROME, DO;  Location: [facility];  Service: Pulmonary;;   BRONCHIAL NEEDLE ASPIRATION BIOPSY  12/20/2021   Procedure: BRONCHIAL NEEDLE ASPIRATION BIOPSIES;  Surgeon: Brenna Adine CROME, DO;  Location: [facility];  Service: Pulmonary;;   CESAREAN SECTION     CHOLECYSTECTOMY     COLONOSCOPY  06/10/2012   RMR: Colonic polyps -removed as described above.    FINE NEEDLE ASPIRATION  12/20/2021   Procedure: FINE NEEDLE ASPIRATION (FNA) LINEAR;  Surgeon: Brenna Adine CROME, DO;  Location: [facility];  Service: Pulmonary;;   KNEE ARTHROSCOPY  1973   right knee,  torn cart   LUMBAR DISC SURGERY  05/12/2015   L4 L5   LUMBAR LAMINECTOMY/DECOMPRESSION MICRODISCECTOMY Left 05/12/2015   Procedure: Left L4-5 Microdiscectomy;  Surgeon: Oneil JAYSON Herald, MD;  Location: [facility];  Service: Orthopedics;  Laterality: Left;   TONSILLECTOMY     VIDEO BRONCHOSCOPY WITH ENDOBRONCHIAL ULTRASOUND Bilateral 12/20/2021   Procedure: VIDEO BRONCHOSCOPY WITH ENDOBRONCHIAL ULTRASOUND;  Surgeon: Brenna Adine CROME, DO;  Location: [facility];  Service: Pulmonary;  Laterality: Bilateral;   VIDEO BRONCHOSCOPY WITH RADIAL ENDOBRONCHIAL ULTRASOUND  12/20/2021   Procedure: RADIAL ENDOBRONCHIAL ULTRASOUND;  Surgeon: Brenna Adine CROME, DO;  Location: [facility];  Service: Pulmonary;;    Allergies  Allergen Reactions   Alpha-Gal Other (See Comments)    Other reaction(s): Abdominal Pain   Other Other (See Comments)   Penicillins     Unknown type of reaction    Immunization History  Administered Date(s) Administered   Fluad Quad(high Dose 65+) 08/08/2022   Influenza, High Dose Seasonal PF 08/07/2023   Influenza, Quadrivalent, Recombinant, Inj, Pf 07/24/2019    Influenza,inj,Quad PF,6+ Mos 07/15/2016, 08/03/2017, 08/29/2018   Influenza-Unspecified 07/16/2021   Moderna Sars-Covid-2 Vaccination 12/25/2019, 01/24/2020   PNEUMOCOCCAL CONJUGATE-20 08/08/2022   Pneumococcal Polysaccharide-23 09/29/2013  RSV,unspecified 09/14/2022    Family History  Problem Relation Age of Onset   Arthritis Mother    Asthma Mother    Depression Mother    Hyperlipidemia Mother    Varicose Veins Mother    Arthritis Father    Heart disease Father    Hyperlipidemia Father    Vision loss Father    Cancer Sister    COPD Sister    Early death Sister    Cancer Brother    Alcohol  abuse Maternal Grandfather    Colon cancer Neg Hx      Current Outpatient Medications:    albuterol  (PROVENTIL ) (5 MG/ML) 0.5% nebulizer solution, Take 2.5 mg by nebulization every 6 (six) hours as needed for wheezing or shortness of breath., Disp: , Rfl:    albuterol  (VENTOLIN  HFA) 108 (90 Base) MCG/ACT inhaler, Inhale 2 puffs into the lungs every 6 (six) hours as needed for wheezing or shortness of breath., Disp: 8 g, Rfl: 2   Budeson-Glycopyrrol-Formoterol  (BREZTRI  AEROSPHERE) 160-9-4.8 MCG/ACT AERO, Inhale 2 puffs into the lungs in the morning and at bedtime., Disp: 2 each, Rfl: 0   EPINEPHrine  (AUVI-Q ) 0.3 mg/0.3 mL IJ SOAJ injection, Inject 0.3 mLs (0.3 mg total) into the muscle as needed for anaphylaxis., Disp: 2 each, Rfl: 1   fluticasone  (FLONASE ) 50 MCG/ACT nasal spray, Place 2 sprays into both nostrils daily. (Patient taking differently: Place 2 sprays into both nostrils daily as needed for allergies.), Disp: 16 g, Rfl: 5   loratadine  (CLARITIN ) 10 MG tablet, Take 10 mg by mouth daily., Disp: , Rfl:    omeprazole  (PRILOSEC) 20 MG capsule, Take 1 capsule (20 mg total) by mouth 2 (two) times daily. (Needs to be seen before next refill), Disp: 60 capsule, Rfl: 0   OVER THE COUNTER MEDICATION, Take 1 capsule by mouth daily. Probiotic, Disp: , Rfl:    Pirfenidone  (ESBRIET ) 267 MG TABS,  Take 3 tablets (801 mg total) by mouth 3 (three) times daily with meals. (Account: [email or web address]) (Patient taking differently: 2 tablets three times daily), Disp: 270 tablet, Rfl: 5   rosuvastatin  (CRESTOR ) 10 MG tablet, Take 10 mg by mouth at bedtime., Disp: , Rfl:    HYDROcodone  bit-homatropine (HYCODAN) 5-1.5 MG/5ML syrup, Take 5 mLs by mouth every 6 (six) hours as needed for cough (lung cancer and fibrosis)., Disp: 240 mL, Rfl: 0      Objective:   There were no vitals filed for this visit.  Estimated body mass index is 21.38 kg/m as calculated from the following:   Height as of 10/15/23: 5' 5 (1.651 m).   Weight as of 10/15/23: 128 lb 8 oz (58.3 kg).  @WEIGHTCHANGE @  There were no vitals filed for this visit.   Physical Exam   General: No distress.  Looks around the same.  Coughs a little bit while talking O2 at rest: 4 L oxygen  Cane present: No Sitting in wheel chair: No Frail: No Obese: No Neuro: Alert and Oriented x 3. GCS 15. Speech normal Psych: Pleasant and husband also present       Assessment:       ICD-10-CM   1. IPF (idiopathic pulmonary fibrosis) (HCC)  J84.112     2. Pulmonary emphysema, unspecified emphysema type (HCC)  J43.9     3. Chronic respiratory failure with hypoxia (HCC)  J96.11     4. Medication monitoring encounter  Z51.81     5. Malignant neoplasm of unspecified part of unspecified bronchus or lung (HCC)  C34.90  6. Dyspnea, unspecified type  R06.00     7. Chronic cough  R05.3          Plan:     Patient Instructions  IPF (idiopathic pulmonary fibrosis) (HCC) Pulmonary emphysema, unspecified emphysema type (HCC) History of lung cancer- stgage 2 b with recurrence right lower lobe June 2024 Chronic respiratory failure with hypoxia (HCC)    -stable clinically though subjectively you feel worse - s/p radiation to lung endingl August 2024 - Severe cough due to fibrosis and lung cancer Hycodan helping - no  flare up or clniical worsening of respiratory failure - normal LFT oct 2024 - normal BNP summer 2024 - improved anemia oct 2024   Plan   -Continue oxygen  4 L at rest [likely need 4 L nasal cannula at night as well] and use 8-12 L with exertion  -For cough  - continue Hycodan 5 mL twice daily for palliative relief of both shortness of breath and cough  - refill done 10/22/2023   - keep active  - continue esbriet  (pirfenidone ) as before; low dose protocol  - for associated emphysema  -continue spiriva - rule out Pulmnary Hypertension - will refer to [facility] for consideration of PHINDER study - get BNP and echo next few weeks (Diane [name])(  - assess ILD getting worse - do spiro/dlco in 6-8 weeks - will check ESR ([place]) and if high consider raditiaon pneumonitis; anytime next few weeks - check  LFT  In6 weeks ; if still high can consider course of steroid   Plan -6-8 weeks 30 min visit  but after spiro   FOLLOWUP No follow-ups on file.    SIGNATURE    Dr. Dorethia Cave, M.D., F.C.C.P,  Pulmonary and Critical Care Medicine Staff Physician, [facility] Director - Interstitial Lung Disease  Program  Pulmonary Fibrosis [facility] at [facility] [place], [place], [address]  Pager: [phone], If no answer or between  15:00h - 7:00h: call [phone] Telephone: [phone]  4:58 PM 10/22/2023  HIGh Complexity  OFFICE   2021 E/M guidelines, first released in 2021, with minor revisions added in 2023. Must meet the requirements for 2 out of 3 dimensions to qualify.    Number and complexity of problems addressed Amount and/or complexity of data reviewed Risk of complications and/or morbidity  Severe exacerbation of chronic illness  Acute or chronic illnesses that may pose a threat to life or bodily function, e.g., multiple trauma, acute MI, pulmonary embolus, severe respiratory distress, progressive rheumatoid  arthritis, psychiatric illness with potential threat to self or others, peritonitis, acute renal failure, abrupt change in neurological status Must meet the requirements for 2 of 3 of the categories)  Category 1: Tests and documents, historian  Any combination of 3 of the following:  Assessment requiring an independent historian - huband had questions on XRT pneumonitis  Review of prior external note(s) from each unique source  Review of results of each unique test  Ordering of each unique test    Category 2: Interpretation of tests    Independent interpretation of a test performed by another physician/other qualified health care professional (not  separately reported) - CT  Category 3: Discuss management/tests  Discussion of management or test interpretation with external physician/other qualified health care professional/appropriate source (not separately reported)  HIGH risk of morbidity from additional diagnostic testing or treatment Examples only:  Drug therapy requiring intensive monitoring for toxicity  Decision for elective major  surgery with identified pateint or procedure risk factors  Decision regarding hospitalization or escalation of level of care  Decision for DNR or to de-escalate care   Parenteral controlled  substances

## 2023-10-22 NOTE — Patient Instructions (Addendum)
 IPF (idiopathic pulmonary fibrosis) (HCC) Pulmonary emphysema, unspecified emphysema type (HCC) History of lung cancer- stgage 2 b with recurrence right lower lobe June 2024 Chronic respiratory failure with hypoxia (HCC)    -stable clinically though subjectively you feel worse - s/p radiation to lung endingl August 2024 - Severe cough due to fibrosis and lung cancer Hycodan helping - no flare up or clniical worsening of respiratory failure - normal LFT oct 2024 - normal BNP summer 2024 - improved anemia oct 2024   Plan   -Continue oxygen  4 L at rest [likely need 4 L nasal cannula at night as well] and use 8-12 L with exertion  -For cough  - continue Hycodan 5 mL twice daily for palliative relief of both shortness of breath and cough  - refill done 10/22/2023   - keep active  - continue esbriet  (pirfenidone ) as before; low dose protocol  - for associated emphysema  -continue spiriva - rule out Pulmnary Hypertension - will refer to [facility] for consideration of PHINDER study - get BNP and echo next few weeks ([place])(  - assess ILD getting worse - do spiro/dlco in 6-8 weeks - will check ESR ([place]) and if high consider raditiaon pneumonitis; anytime next few weeks - check  LFT  In6 weeks ; if still high can consider course of steroid   Plan -6-8 weeks 30 min visit  but after spiro

## 2023-10-29 ENCOUNTER — Other Ambulatory Visit: Payer: Self-pay | Admitting: Internal Medicine

## 2023-10-29 NOTE — Telephone Encounter (Signed)
 This is Dr. Jane Canary patient. I have never seen her before. He is in office this afternoon. Please f/u with him. Thanks.

## 2023-11-01 ENCOUNTER — Ambulatory Visit: Payer: Medicare Other | Admitting: Internal Medicine

## 2023-11-09 ENCOUNTER — Ambulatory Visit (HOSPITAL_COMMUNITY)
Admission: RE | Admit: 2023-11-09 | Discharge: 2023-11-09 | Disposition: A | Discharge status: Home / Self Care | Payer: Medicare Other | Source: Ambulatory Visit | Attending: Internal Medicine | Admitting: Internal Medicine

## 2023-11-09 DIAGNOSIS — R053 Chronic cough: Secondary | ICD-10-CM | POA: Diagnosis present

## 2023-11-09 DIAGNOSIS — J439 Emphysema, unspecified: Secondary | ICD-10-CM | POA: Diagnosis present

## 2023-11-09 DIAGNOSIS — J84112 Idiopathic pulmonary fibrosis: Secondary | ICD-10-CM | POA: Diagnosis present

## 2023-11-09 DIAGNOSIS — R0609 Other forms of dyspnea: Secondary | ICD-10-CM

## 2023-11-09 DIAGNOSIS — C3490 Malignant neoplasm of unspecified part of unspecified bronchus or lung: Secondary | ICD-10-CM | POA: Diagnosis present

## 2023-11-09 DIAGNOSIS — J9611 Chronic respiratory failure with hypoxia: Secondary | ICD-10-CM | POA: Insufficient documentation

## 2023-11-09 DIAGNOSIS — R0600 Dyspnea, unspecified: Secondary | ICD-10-CM | POA: Insufficient documentation

## 2023-11-09 DIAGNOSIS — Z5181 Encounter for therapeutic drug level monitoring: Secondary | ICD-10-CM | POA: Diagnosis present

## 2023-11-09 LAB — ECHOCARDIOGRAM COMPLETE
Area-P 1/2: 3.42 cm2
P 1/2 time: 479 ms
S' Lateral: 3 cm

## 2023-11-09 NOTE — Progress Notes (Signed)
*  PRELIMINARY RESULTS* Echocardiogram 2D Echocardiogram has been performed.  Diane [name] 11/09/2023, 4:18 PM

## 2023-11-11 ENCOUNTER — Encounter: Payer: Self-pay | Admitting: Internal Medicine

## 2023-11-11 ENCOUNTER — Other Ambulatory Visit: Payer: Self-pay | Admitting: Internal Medicine

## 2023-11-12 NOTE — Telephone Encounter (Signed)
Last filled on 10/22/23    Last appt was cancel on 11/01/23

## 2023-11-14 ENCOUNTER — Other Ambulatory Visit: Payer: Self-pay | Admitting: *Deleted

## 2023-11-14 ENCOUNTER — Telehealth: Payer: Self-pay | Admitting: *Deleted

## 2023-11-14 DIAGNOSIS — J849 Interstitial pulmonary disease, unspecified: Secondary | ICD-10-CM

## 2023-11-14 MED ORDER — PIRFENIDONE 267 MG PO TABS: 801.0000 mg | ORAL_TABLET | Freq: Three times a day (TID) | ORAL | 5 refills | Status: DC

## 2023-11-14 NOTE — Telephone Encounter (Signed)
Good afternoon, I submitted a prescription for Esbriet for this patient and received a notification that it requires a prior auth.  Please begin prior auth.  Patient receives this medication through the mail and it comes from [facility].  Thank you.

## 2023-11-15 NOTE — Telephone Encounter (Signed)
Patient receives pirfenidone through [facility]. Does not require prior authorization as it is not process through insurance. MyChart message sent to patient. NFN  Chesley Mires, PharmD, MPH, BCPS, CPP Clinical Pharmacist (Rheumatology and Pulmonology)

## 2023-11-15 NOTE — Telephone Encounter (Signed)
Specialty med

## 2023-11-15 NOTE — Telephone Encounter (Signed)
This is already being managed in another encounter

## 2023-11-15 NOTE — Telephone Encounter (Signed)
Pt checking on rx for Esbriet- looks like it may need PA

## 2023-11-15 NOTE — Telephone Encounter (Signed)
Submitted a Prior Authorization request to [facility] for PIRFENIDONE via CoverMyMeds. Will update once we receive a response.  Key: [id number]

## 2023-11-16 ENCOUNTER — Other Ambulatory Visit (HOSPITAL_COMMUNITY): Payer: Self-pay

## 2023-11-16 NOTE — Telephone Encounter (Signed)
Received notification from [facility] regarding a prior authorization for PIRFENIDONE. Authorization has been APPROVED from 11/15/2023 until further notice. Approval letter sent to scan center.  Per test claim, copay for 30 days supply is $618.39  Patient can fill through [facility]: [phone]   Authorization # [id number]

## 2023-11-17 NOTE — Telephone Encounter (Signed)
MR, please advise on cough syrup refill. Thanks

## 2023-11-27 NOTE — Telephone Encounter (Signed)
I am ok to do refill on cough syrup. Still needs it. Sorry missed this prescription

## 2023-11-28 ENCOUNTER — Other Ambulatory Visit: Payer: Self-pay | Admitting: Internal Medicine

## 2023-11-28 NOTE — Telephone Encounter (Signed)
Patient is still in need of cough syrup. Cough is bad at night.

## 2023-11-29 NOTE — Telephone Encounter (Signed)
Pt requesting refill

## 2023-11-29 NOTE — Telephone Encounter (Signed)
This is Dr. Jane Canary pt - can you route to him for refill? I've never seen her. Thanks!

## 2023-12-04 ENCOUNTER — Other Ambulatory Visit: Payer: Self-pay | Admitting: Internal Medicine

## 2023-12-13 ENCOUNTER — Other Ambulatory Visit: Payer: Self-pay | Admitting: Internal Medicine

## 2023-12-14 NOTE — Telephone Encounter (Signed)
Please advise refill? 

## 2023-12-18 MED ORDER — HYDROCODONE BIT-HOMATROP MBR 5-1.5 MG/5ML PO SOLN: 5.0000 mL | Freq: Four times a day (QID) | ORAL | 0 refills | Status: DC | PRN

## 2023-12-18 NOTE — Telephone Encounter (Signed)
 done

## 2024-01-08 ENCOUNTER — Other Ambulatory Visit: Payer: Self-pay | Admitting: Internal Medicine

## 2024-01-14 ENCOUNTER — Other Ambulatory Visit: Payer: Self-pay | Admitting: Internal Medicine

## 2024-01-14 NOTE — Telephone Encounter (Signed)
 Please advise refill of controlled substance. Pt last seen  08-07-23 by Dr Marchelle Gearing.

## 2024-01-18 ENCOUNTER — Telehealth: Payer: Self-pay

## 2024-01-18 NOTE — Telephone Encounter (Signed)
 Copied from CRM [id number]. Topic: Clinical - Prescription Issue >> Jan 18, 2024 12:09 PM Adele Barthel wrote: Reason for CRM:   Patient is calling in regards to refill request placed on 03/25 and 03/31 for HYDROcodone bit-homatropine (HYCODAN) 5-1.5 MG/5ML syrup. Her pharmacy has notified her that they have not received any refills. She is now completley out of medication and reports this is needed for her cough.   CB#  [phone]  This is a controlled substance. Dr Marchelle Gearing, please advised.

## 2024-01-21 MED ORDER — HYDROCODONE BIT-HOMATROP MBR 5-1.5 MG/5ML PO SOLN: 5.0000 mL | Freq: Four times a day (QID) | ORAL | 0 refills | Status: DC | PRN

## 2024-01-21 NOTE — Addendum Note (Signed)
 Addended by: Kalman Shan on: 01/21/2024 05:19 PM   Modules accepted: Orders

## 2024-01-21 NOTE — Telephone Encounter (Signed)
 Sent  Pls ask pharmacy if they will take refills for this . Right now sent only 1 month

## 2024-02-05 ENCOUNTER — Encounter: Payer: Self-pay | Admitting: Internal Medicine

## 2024-02-05 ENCOUNTER — Ambulatory Visit (HOSPITAL_COMMUNITY)
Admission: RE | Admit: 2024-02-05 | Discharge: 2024-02-05 | Disposition: A | Discharge status: Home / Self Care | Payer: Medicare Other | Source: Ambulatory Visit | Attending: Internal Medicine | Admitting: Internal Medicine

## 2024-02-05 ENCOUNTER — Inpatient Hospital Stay: Payer: Medicare Other | Attending: Internal Medicine

## 2024-02-05 ENCOUNTER — Encounter (HOSPITAL_COMMUNITY): Payer: Self-pay

## 2024-02-05 DIAGNOSIS — C3490 Malignant neoplasm of unspecified part of unspecified bronchus or lung: Secondary | ICD-10-CM | POA: Insufficient documentation

## 2024-02-05 LAB — CMP (CANCER CENTER ONLY)
ALT: 31 U/L (ref 0–44)
AST: 32 U/L (ref 15–41)
Albumin: 4.2 g/dL (ref 3.5–5.0)
Alkaline Phosphatase: 87 U/L (ref 38–126)
Anion gap: 7 (ref 5–15)
BUN: 14 mg/dL (ref 8–23)
CO2: 27 mmol/L (ref 22–32)
Calcium: 9.6 mg/dL (ref 8.9–10.3)
Chloride: 103 mmol/L (ref 98–111)
Creatinine: 0.63 mg/dL (ref 0.44–1.00)
GFR, Estimated: >60 mL/min (ref >60)
Glucose, Bld: 109 mg/dL — ABNORMAL HIGH (ref 70–99)
Potassium: 4 mmol/L (ref 3.5–5.1)
Sodium: 137 mmol/L (ref 135–145)
Total Bilirubin: 0.3 mg/dL (ref 0.0–1.2)
Total Protein: 8.3 g/dL — ABNORMAL HIGH (ref 6.5–8.1)

## 2024-02-05 LAB — CBC WITH DIFFERENTIAL (CANCER CENTER ONLY)
Abs Immature Granulocytes: 0.02 10*3/uL (ref 0.00–0.07)
Basophils Absolute: 0 10*3/uL (ref 0.0–0.1)
Basophils Relative: 1 %
Eosinophils Absolute: 0.2 10*3/uL (ref 0.0–0.5)
Eosinophils Relative: 4 %
HCT: 33.1 % — ABNORMAL LOW (ref 36.0–46.0)
Hemoglobin: 11.2 g/dL — ABNORMAL LOW (ref 12.0–15.0)
Immature Granulocytes: 0 %
Lymphocytes Relative: 19 %
Lymphs Abs: 1 10*3/uL (ref 0.7–4.0)
MCH: 30.7 pg (ref 26.0–34.0)
MCHC: 33.8 g/dL (ref 30.0–36.0)
MCV: 90.7 fL (ref 80.0–100.0)
Monocytes Absolute: 0.5 10*3/uL (ref 0.1–1.0)
Monocytes Relative: 9 %
Neutro Abs: 3.6 10*3/uL (ref 1.7–7.7)
Neutrophils Relative %: 67 %
Platelet Count: 255 10*3/uL (ref 150–400)
RBC: 3.65 MIL/uL — ABNORMAL LOW (ref 3.87–5.11)
RDW: 12.3 % (ref 11.5–15.5)
WBC Count: 5.3 10*3/uL (ref 4.0–10.5)
nRBC: 0 % (ref 0.0–0.2)

## 2024-02-05 MED ORDER — SODIUM CHLORIDE (PF) 0.9 % IJ SOLN
INTRAMUSCULAR | Status: AC
  Filled 2024-02-05: qty 50

## 2024-02-05 MED ORDER — IOHEXOL 300 MG/ML  SOLN
75.0000 mL | Freq: Once | INTRAMUSCULAR | Status: AC | PRN
  Administered 2024-02-05: 75 mL via INTRAVENOUS

## 2024-02-11 ENCOUNTER — Other Ambulatory Visit (HOSPITAL_COMMUNITY)
Admission: RE | Admit: 2024-02-11 | Discharge: 2024-02-11 | Disposition: A | Discharge status: Home / Self Care | Source: Ambulatory Visit | Attending: Internal Medicine | Admitting: Internal Medicine

## 2024-02-11 DIAGNOSIS — Z5181 Encounter for therapeutic drug level monitoring: Secondary | ICD-10-CM | POA: Diagnosis present

## 2024-02-11 DIAGNOSIS — J9611 Chronic respiratory failure with hypoxia: Secondary | ICD-10-CM | POA: Diagnosis present

## 2024-02-11 DIAGNOSIS — R053 Chronic cough: Secondary | ICD-10-CM | POA: Insufficient documentation

## 2024-02-11 DIAGNOSIS — C3490 Malignant neoplasm of unspecified part of unspecified bronchus or lung: Secondary | ICD-10-CM | POA: Diagnosis present

## 2024-02-11 DIAGNOSIS — J84112 Idiopathic pulmonary fibrosis: Secondary | ICD-10-CM | POA: Insufficient documentation

## 2024-02-11 DIAGNOSIS — J439 Emphysema, unspecified: Secondary | ICD-10-CM | POA: Diagnosis present

## 2024-02-11 DIAGNOSIS — R0600 Dyspnea, unspecified: Secondary | ICD-10-CM | POA: Diagnosis present

## 2024-02-11 LAB — BRAIN NATRIURETIC PEPTIDE: B Natriuretic Peptide: 42 pg/mL (ref 0.0–100.0)

## 2024-02-11 LAB — SEDIMENTATION RATE: Sed Rate: 45 mm/h — ABNORMAL HIGH (ref 0–22)

## 2024-02-13 ENCOUNTER — Encounter: Payer: Self-pay | Admitting: Internal Medicine

## 2024-02-13 ENCOUNTER — Ambulatory Visit (INDEPENDENT_AMBULATORY_CARE_PROVIDER_SITE_OTHER): Payer: Medicare Other | Admitting: Internal Medicine

## 2024-02-13 ENCOUNTER — Telehealth: Payer: Self-pay | Admitting: Internal Medicine

## 2024-02-13 ENCOUNTER — Ambulatory Visit: Payer: Medicare Other | Admitting: Internal Medicine

## 2024-02-13 VITALS — BP 118/70 | HR 93 | Ht 65.0 in | Wt 132.3 lb

## 2024-02-13 DIAGNOSIS — J439 Emphysema, unspecified: Secondary | ICD-10-CM | POA: Diagnosis not present

## 2024-02-13 DIAGNOSIS — Z5181 Encounter for therapeutic drug level monitoring: Secondary | ICD-10-CM

## 2024-02-13 DIAGNOSIS — J84112 Idiopathic pulmonary fibrosis: Secondary | ICD-10-CM

## 2024-02-13 DIAGNOSIS — J9611 Chronic respiratory failure with hypoxia: Secondary | ICD-10-CM

## 2024-02-13 DIAGNOSIS — R053 Chronic cough: Secondary | ICD-10-CM | POA: Diagnosis not present

## 2024-02-13 DIAGNOSIS — C3490 Malignant neoplasm of unspecified part of unspecified bronchus or lung: Secondary | ICD-10-CM

## 2024-02-13 DIAGNOSIS — R0600 Dyspnea, unspecified: Secondary | ICD-10-CM

## 2024-02-13 LAB — PULMONARY FUNCTION TEST
DL/VA % pred: 52 %
DL/VA: 2.17 ml/min/mmHg/L
DLCO cor % pred: 32 %
DLCO cor: 6.92 ml/min/mmHg
DLCO unc % pred: 30 %
DLCO unc: 6.4 ml/min/mmHg
FEF 25-75 Pre: 1.41 L/s
FEF2575-%Pred-Pre: 63 %
FEV1-%Pred-Pre: 61 %
FEV1-Pre: 1.59 L
FEV1FVC-%Pred-Pre: 103 %
FEV6-%Pred-Pre: 61 %
FEV6-Pre: 2 L
FEV6FVC-%Pred-Pre: 104 %
FVC-%Pred-Pre: 59 %
FVC-Pre: 2 L
Pre FEV1/FVC ratio: 80 %
Pre FEV6/FVC Ratio: 100 %

## 2024-02-13 MED ORDER — HYDROCODONE BIT-HOMATROP MBR 5-1.5 MG/5ML PO SOLN: 5.0000 mL | Freq: Four times a day (QID) | ORAL | 0 refills | Status: DC | PRN

## 2024-02-13 NOTE — Patient Instructions (Addendum)
 IPF (idiopathic pulmonary fibrosis) (HCC) with Radiation Fibrosis Pulmonary emphysema, unspecified emphysema type (HCC) History of lung cancer- stgage 2 b with recurrence right lower lobe June 2024 Chronic respiratory failure with hypoxia (HCC)    -stable clinicallyx 1 year - s/p radiation to lung endingl August 2024 - Severe cough due to fibrosis and lung cancer Hycodan helping - no flare up or clniical worsening of respiratory failure - glad you are stable  - no evidence of pulmonary hypertension on echo early 2025 - LFT normal April2025  Plan   -Continue oxygen  4 L at rest [likely need 4 L nasal cannula at night as well] and use 8-12 L with exertion  -For cough  - continue Hycodan 5 mL twice daily for palliative relief of both shortness of breath and cough  - refill done 02/13/2024    - keep active  - continue esbriet  (pirfenidone ) as before; low dose protocol  - for associated emphysema  -continue spiriva  - await formal radiology report  - will ask radiologist if there is potential role for steroids for the radiation pneumniitisi  - refer pulmnary rehab  -Addendum based on CT report is to get ESR and procalcitonin.  If the ESR is high but procalcitonin is normal we will give her a course of steroids   Plan = 16 weeks x 15 min visit

## 2024-02-13 NOTE — Patient Instructions (Signed)
 Spirometry/DLCO performed today.

## 2024-02-13 NOTE — Progress Notes (Signed)
 OV 11/13/2022 transfer of care to the ILD center with Dr. Bertrum Brodie.  Subjective:  Patient ID: Diane [name], female , DOB: 18-Oct-1955 , age 68 y.o. , MRN: [id number] , ADDRESS: [address] [place] [address] PCP [name], MD Patient Care Team: [name], MD as PCP - General (Internal Medicine) Riley Cheadle Windsor Hatcher, MD as Consulting Physician (Gastroenterology) Devon Fogo, MD (Inactive) as Consulting Physician (Dermatology)  This Provider for this visit: Treatment Team:  Attending Provider: Maire Scot, MD    11/13/2022 -   Chief Complaint  Patient presents with   New Patient (Initial Visit)    New pt from [facility] for ILD. Patient filled out packet and for Esbriet  but never started the medication. CT is for April 2024. Pt is just getting over PNA. Still has a slight cough. Just finished prednisone  and Levaquin.      HPI Diane [name] 68 y.o. -presents with her husband Diane [name].  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  She retire and then in the spring 2023 was diagnosed with right lower lobe non-small cell cancer stage II.  She is status post radiation and also chemotherapy.  Currently on observation treatment.  This is based on independent history from the husband and the patient and also review of the external records.  She is understandably quite frustrated with the onset of all these health issues given the fact she just retired.  Then approximately in September 2023 started noticing significant amount of dyspnea on exertion walking the dog and climbing stairs.  At this time desaturations with exercise to be noticed.  She has since been using oxygen  and this helps her.  She is attended cardiac rehabilitation or pulmonary rehabilitation and this also helped her.  She was then given a diagnosis of pulmonary fibrosis/UIP.  Antifibrotic pirfenidone  was started based on her history and also external  record review.  However due to some confusion of the paperwork this has not been started yet.  She is here for evaluation of her ILD.  Visualization of CT scan from 2015 shows presence of possible early ILD even back in 2015 with some honeycombing/emphysema at the base.  There was a craniocaudal gradient back pain.   Also of note she just went to a [place] cruise in mid January 2024.  When she came back on October 28, 2022.  Then on October 30, 2022 she had cough headaches no fever.  Her flu test and COVID was negative.  She is being treated for acute bronchitis with Levaquin and IM steroids and 6-day prednisone  taper.  She will she was given a diagnosis of pneumonia actually.  She is almost back to baseline but still feels fatigued and still with some cough worse than her baseline. Diane [name] Integrated Comprehensive ILD Questionnaire  Symptoms:  0 she believes she has had much rapid worsening in the last 6 months only.  Suspect this is after her radiation.  Current symptom scores are as below.   Past Medical History :  -She was told 20 years ago she had hiatal hernia and acid reflux but she says she has had no issues since then.  It is definitely not described in the current CT chest -She is awaiting a sleep study -She had seizures 12 years ago. -She has hypertension controlled on medication -Currently just finished antibiotic and steroid course-has had COVID-vaccine but never had COVID    ROS:  -Tired and does have  dry eyes -Has acid reflux for which she is taking omeprazole   FAMILY HISTORY of LUNG DISEASE:  *-Mother had asthma but there is no family history of lung disease  PERSONAL EXPOSURE HISTORY:   -She smoked between 1973 and 2022.  Smoked 1 pack/day and then quit.  In the 1980s and 1990s very briefly very rarely she did some cocaine.  She smoked some marijuana between 1976 and 2022 very minimal.  HOME  EXPOSURE and HOBBY DETAILS :  [place]   The home is 67 years old.  She is lived there for 19 years.  She recently got her down comforter 3 months ago but otherwise no organic antigen exposure in the house.  Although there is a leaky roof for the last 1 year in 2021 but there is no mold.  She is retired.  She works at [employer].  She works in [employer].  She did desk job.  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  INVESTIGATIONS: -As below    HRCT dec 2023 -personally visualized and independently agree  COMPARISON:  Chest CT 06/27/2022.   FINDINGS: Cardiovascular: Heart size is normal. There is no significant pericardial fluid, thickening or pericardial calcification. There is aortic atherosclerosis, as well as atherosclerosis of the great vessels of the mediastinum and the coronary arteries, including calcified atherosclerotic plaque in the left main, left anterior descending and right coronary arteries.   Mediastinum/Nodes: No pathologically enlarged mediastinal or left hilar lymph nodes. Soft tissue fullness in the right hilar region concerning for underlying lymphadenopathy, but poorly evaluated on today's noncontrast CT examination (best appreciated on axial image 60 of series 2). Esophagus is unremarkable in appearance. No axillary lymphadenopathy.   Lungs/Pleura: Treated lesion in the right lower lobe is now partially obscured by surrounding parenchymal changes, but is estimated to measure approximately 2.1 x 1.4 cm (axial image 114 of series 4). There has been a dramatic increase in surrounding areas of ground-glass attenuation, septal thickening, bronchiectasis and cystic changes, and regional architectural distortion in the surrounding lung parenchyma, presumably reflective of evolving postradiation changes. Several nodular areas of  architectural distortion are now noted in the right lung, most notably a macrolobulated nodule with spiculated margins in the right upper lobe (axial image 86 of series 4) measuring 1.9 x 1.3 cm, and a slightly ill-defined 1.9 x 1.1 cm nodular area of architectural distortion in the right lower lobe at the base (axial image 185 of series 4). Small right pleural effusion predominantly lying dependently. No left pleural effusion. In the remaining portions of the lungs are widespread areas of septal thickening, subpleural reticulation, traction bronchiectasis, peripheral bronchiolectasis and honeycombing, most evident in the mid to lower lung distribution. Inspiratory and expiratory imaging is unremarkable. There is also a background of mild diffuse bronchial wall thickening with moderate centrilobular and paraseptal emphysema.   Upper Abdomen: Aortic atherosclerosis.  Status post cholecystectomy.   Musculoskeletal: There are no aggressive appearing lytic or blastic lesions noted in the visualized portions of the skeleton.  IMPRESSION: 1. The appearance of the lungs is compatible with study. 2. Evolving postradiation changes in the right lung. Treated nodule in the superior segment of the right lower lobe appears grossly similar to the prior study, while there are new nodules in the right lung, most concerning of which is in the right  upper lobe (axial image 86 of series 4), currently measuring 1.9 x 1.3 cm. Close attention on follow-up studies is recommended. These new areas of nodularity could be treatment related or infectious or inflammatory in etiology, however, neoplasm is not excluded. 3. Mild diffuse bronchial wall thickening with moderate centrilobular and paraseptal emphysema; imaging findings suggestive of underlying COPD. 4. Aortic atherosclerosis, in addition to left main and 2 vessel coronary artery disease. Please note that although the presence of coronary artery  calcium documents the presence of coronary artery disease, the severity of this disease and any potential stenosis cannot be assessed on this non-gated CT examination. Assessment for potential risk factor modification, dietary therapy or pharmacologic therapy may be warranted, if clinically indicated.   Aortic Atherosclerosis (ICD10-I70.0) and Emphysema (ICD10-J43.9).     Electronically Signed   By: Alexandria Angel M.D.   On: 09/27/2022 07:43   Telephone with SAra 12/13/22 tient called the office 12/13/2022  to get her sleep study results.  While I was talking with the patient she was short of breath and shared that she had increased oxygen  demands. She said this has been going on for several weeks since she returned from  a cruise she took with friends to celebrate finishing chemo and radiation . This was a [place] cruise in January 2024. She returned October 28, 2022. Then on October 30, 2022 she had cough headaches no fever. Her flu test and COVID was negative. She was treated for acute bronchitis and pneumonia  with Levaquin and IM steroids and 6-day prednisone  taper.  She was feeling better but still fatigued and had a and still with some cough worse than her baseline  when she saw Dr. Bertrum Brodie  11/13/2022.  I ordered a CXR at [facility] (Closer to patient ).I also ordered a home tank with a higher liter flow capacity as her oxygen  demand was so much higher.. CXR Results 2/28 confirmed >>  Diffuse bilateral interstitial pulmonary opacity, increased compared to prior examination, consistent with edema or infection. No focal airspace opacity. Unchanged post treatment appearance of the right midlung.I called the patient with the results of her scan and sent in prescriptions for Doxycycline  100 mg BID x 7 days and a prednisone  taper Prednisone  taper; 10 mg tablets: 4 tabs x 2 days, 3 tabs x 2 days, 2 tabs x 2 days 1 tab x 2 days then stop. She has a penicillin allergy , and history of QT  prolongation .  I  asked her to start Mucinex  1200 mg each morning with a glass of water  and start using her flutter valve. She states her secretions are thick and hard to get up. I have asked her to use her albuterol  nebs in the morning and evening while she is sick, and to wear her oxygen  at bedtime at 2  liters. When I asked if she has had a fever, she said she did not think so, but when I asked if she had chills , she said she has had a lot of couch time covered in blankets, so endorses that she may have had fever. I asked Diane [name] to check in with me Friday 12/15/2022 to ensure she was improving. We discussed that if she does not start turning around soon, we may need to consider IV antibiotics. We discussed if she gets worse , not better to call the office  or seek emergency care.  I reviewed her last echo from 11/15/2022, which shows EF of 60-65%, LV normal function, Left  ventricular diastolic parameters are consistent with Grade I diastolic  dysfunction. She denied any lower extremity edema. She does have a follow up CT Chest scheduled 01/25/2023 per oncology,I plan to  order one earlier if she does not improve.    12/20/2022 -office with Diane [name]  Pt. Presents for short term follow up of multifocal pneumonia noted on CXR 12/13/2022. She was treated with Doxycycline  and prednisone  taper 12/13/2022 for her slow to resolve pneumonia . She states she is feeling much better. She states it is like night and day. She states she is back to her baseline. She is using 3 L [place] at rest and 4 L with exertion. Her oxygen  demand is back to her baseline and she has her energy back. She does still have a cough, which is productive . She coughed in the office and she had some blood streaks in it. She states this is new, and she thinks it is from coughing. Secretions are mostly clear with some tan bits. She denies fever or chest pain. She has completed the Doxycycline  and has her last dose of prednisone  tomorrow. I will give her  an additional 7 days of 10 mg prednisone  daily to cover her until she sees Dr. Bertrum Brodie next  week.  She started perfididone Monday 12/18/2022. I have told her to let us  know if she has any stomach upset.  She is much more compliant with her oxygen  use. She had her oxygen  on today in the office. She is monitoring her oxygen  levels. She is compliant with her Anoro daily. She is using her albuterol  as needed. She has been using her Mucinex  with flutter valve for secretion mobilization.   She does have headaches, which I think are from her cough. I have prescribed her cough medication with Codeine to see if that resolves the headaches.   She is still waiting in her CPAP machine and her 10 L home oxygen  concentrator. My nurse will check with [employer] about the delay.      12/26/2022 -   Chief Complaint  Patient presents with   Follow-up    PFT F/up   r  HPI Diane [name] 68 y.o. -returns for follow-up.  She presents with her husband.  She tells me that after she saw me she got the prednisone  taper and felt transiently better but then when she had a phone call with Sallee Craw it was noticed that she was quite symptomatic.  She was then called in for an acute visit and given 7-10 days of daily prednisone  along with an antibiotic.  This then has helped her come back to baseline.  She feels she is currently at baseline.  At the time it was noticed that she was desaturating easily and she needed significant amount of oxygen  to correct.  Her home oxygen  concentrator only go up to 5 L.  Therefore we did a oxygen  titration test today and she actually needs 8-10 L to correct and walk our standard 3 laps in the office.  Nevertheless she feels closer to baseline.  She still has a lot of fatigue and shortness of breath and she wants to improve on this.  She is open and willing to attend pulmonary rehabilitation.  Apparently referral has been made by me and by nurse practitioner but she still has not heard.   I did indicate to her we will make another referral again.  She also told me that she is now touch base with Diane [name] the support group  chair and she is enjoying those conversations.  She had pulmonary function test that compared to a year ago shows significant decline at least 16% and the FVC decline in a year.  She just started Esbriet  for this and as of yesterday has gone to 3 pills 3 times daily.  She is to have an echo and pulmonary artery pressures are reported as normal  Other than fatigue which she does not believe is due to pirfenidone  she is actually tolerating the pirfenidone  quite well.    02/06/2023 Pt. Presents for follow up 6 weeks after seeing Dr. Bertrum Brodie. She was started on Esbriet  10/31/2022.She initially was  Unable tolerate 3 tabs TID ( vomiting and constant  nausea). She dropped the dose in March to 2 tabs TID, and then tried to increase to 3 tabs daily again and was nauseated with weight loss of 5 pounds in 1 week. She dropped her dose back to 2 tablets TID  on  02/04/2023  and she is able to tolerate this dose. She is tolerating the lower dose well, already feels better after 2 days on the lower dose. She has been able to tolerate 2 tablets three times daily.We will hold at this dose for now until she sees Dr. Bertrum Brodie again in 6 weeks. LFT's drawn at the cancer center 01/25/2023 show  AST of 16, ALT if 11, Alk Phos of 64 and total bili of 0.3, so all WNL.She states she is no longer drinking wine , which is most likely helping maintain normal hepatic labs. Her CBC did show some anemia. HGB of 11.3 with a WBC of 7.5. This is about a  1.3 gram drop over previous HGB which was 12.6 four months ago. She has been started on Iron by Dr. Marguerita Shih, and she has follow up labs placed by Dr.Mohamed  Diane [name] looks great today. No fever, no change in secretions, no change in shortness of breath. She feels she is at her baseline. Current oxygen  use is 4L [place] at rest and 6 L with exertion. She is  wearing her oxygen  at all times, and she maintains saturations > 92%. She is not using her CPAP with everything she has going on but she is wearing her oxygen  at bedtime. She does have a POC that her husband  purchased without insurance , and she uses this when driving in the car, as it meets her at rest oxygen  needs.  She is doing well at pulmonary rehab. She comes to the [facility] re[facility] office and she feels she is getting a tremendous benefit from this. She is also meeting other ILD patient's and gets comfort and support from them.   There were some changes on her surveillance CT Chest  01/27/2023. Her previous CT scan in December 2023 showed finding compatible with a combination of emphysema and interstitial lung disease which has been progressive.  She also had evolving postradiation changes in the right lung and the treated nodule in the superior segment of the right lower lobe appeared grossly similar but there were new nodules within the right lung most concerning of which is in the right upper lobe and currently measures 1.9 x 1.3 cm requiring close monitoring.  She had a PET scan performed in early January 2024 and it showed no concerning findings for disease progression,  in the area of concern showed moderate metabolic activity that are nonspecific and may be related to infectious or inflammatory process but neoplasm could not be completely excluded. She has follow up  CT ordered for 3 months which will be July 2024. She knows to call immediately for any concerning symptoms or changes in her breathing.   We are doing a therapeutic trial with Breztri . She is currently on Anoro which she states does not seem to help with her breathing. We discussed that there is a steroid component to the Breztri  which can increase risk of pneumonia. She knows to stop using inhaler and call to be seen if she has any changes in her breathing status. I have also prescribed an albuterol  inhaler as she does have episodes  of what sounds like upper airway wheezing.   We need to continue to monitor for weight loss. On the Esbriet  3 tabs TID she lost a significant amount of weight. We will re-evaluate her weight upon follow up with Dr. Bertrum Brodie in 6 weeks on the 2 tablets TID.  She will need repeat LFT's in 4-6 weeks , She sees Dr. Brooke Canton 03/22/2023.      Test Results: CT Chest with contrast 01/27/2023 Slight increase in focal opacity involving the superior segment right lower lobe with increasing pleural thickening and loculated small pleural fluid.   The previously seen hypermetabolic nodules in the right upper lobe and lower lobe separately are again noted today in our appear relatively similar when adjusting for technique.   Persistent soft tissue fullness of the right lung hilum as well as some prominent nodes in the mediastinum and left hilum.   Underlying advanced emphysematous lung changes.   Aortic Atherosclerosis (ICD10-I70.0) and Emphysema (ICD10-J43.9).   OV 03/22/2023  Subjective:  Patient ID: Diane [name], female , DOB: [birth year] , age 24 y.o. , MRN: [id number] , ADDRESS: [address] [place] [address] PCP [name], MD Patient Care Team: [name], MD as PCP - General (Internal Medicine) Riley Cheadle, Windsor Hatcher, MD as Consulting Physician (Gastroenterology) Devon Fogo, MD (Inactive) as Consulting Physician (Dermatology)  This Provider for this visit: Treatment Team:  Attending Provider: Maire Scot, MD  #IPF/UIP pattern #Associated pulmonary emphysema #Combined pulmonary fibrosis emphysema  -Chronic green sputum #Lung cancer #DNR , ECOG 3-4, Class 4 dyspnea  03/22/2023 -   Chief Complaint  Patient presents with   Follow-up    PFT:03/22/23 O2 4l     HPI Diane [name] 68 y.o. -returns for follow-up.  Since her last visit her symptoms have deteriorated.  Pulmonary function test also shows a steep decline in both FVC and particularly with  DLCO.  She tells me that ever since January 2024 she is having a steady decline but more so in the last 1 month.  And send the last 1 month her shower and getting dressed time has gotten worse.  She used to be able to get ready within 30 minutes now she is requiring 1 hour.  However at pulmonary rehabilitation the max oxygen  she is required has been 8-10 L [4 L at rest] but 2 days ago abruptly she required 15 L and I was alerted about it.  She is denying any hemoptysis or fever or chills.  She has chronic green sputum but this is not change.  There is no worsening of cough there is no edema.  She is not on any anticoagulation.  In addition she is also noticing abrupt spasms of breathing for the last 1 month is almost a stridorous noise.  She was sucking air and it is loud it happens spontaneously.  Happens around 20 times a day.  Her husband comes rushing  from the next room.  But she states her pulse ox remained stable.  Correlating with all this is the pulmonary function test to significantly decline.  Also CT scan of the chest in April 2024 that I personally visualized is significantly worse.  Dr. Marguerita Shih is doing another CT scan in July 2024.  Today she went to her primary care physician at [facility] but I do not have the results she showed me lipid results but she believes the liver function test was normal.  She says that she has been given a life expectancy of less than 1 year she is upset about this.  But she says she is also realistic.  She says she has advanced directives she is working on it.  She is agreed to be a DNR.  Symptom wise significant cough is there but dyspnea is even worse.  She is willing to get Hycodan to help both symptoms  Overall with her quality of life and decline while she is looking for the reversible and looking to remain as healthy and as alive as possible she is also willing to be seen by palliative care at home.  We did a goals of care discussion about all this.  Filled out [employer] emergency backup electricity form.  Also filled out handicap placard form.    OV 04/13/2023  Subjective:  Patient ID: Diane [name], female , DOB: 07-Mar-1956 , age 4 y.o. , MRN: [id number] , ADDRESS: [address] [place] [address] PCP [name], MD Patient Care Team: [name], MD as PCP - General (Internal Medicine) Riley Cheadle Windsor Hatcher, MD as Consulting Physician (Gastroenterology) Devon Fogo, MD (Inactive) as Consulting Physician (Dermatology)  This Provider for this visit: Treatment Team:  Attending Provider: Maire Scot, MD  Type of visit: Video Virtual Visit Identification of patient Diane [name] with 07-08-1956 and MRN [id number] - 2 person identifier Risks: Risks, benefits, limitations of telephone visit explained. Patient understood and verbalized agreement to proceed Anyone else on call: husband Patient location: her home This provider location: [address]; [place]; [place] [address]. [facility]. [phone]    04/13/2023 -   Chief Complaint  Patient presents with   Follow-up    F/up discuss intervenous steroids and discuss some other medical issues.     HPI Diane [name] 68 y.o. - preents with husband on video visit . Has been betting worse. . IN interim, ABG without hypercapnia. CTA ruled out PE but showed worsning RLL nodule. Has known post XRT consolidation in RLL. No ILD flare up. Saw onc and rad onc. Notes reviewed. Advised SBRT mid uly x 5 sessions. She is worriued about ILD flare with XRT. But she is on pirfenidone . Wants me clearance pripr to SBRT and also if neeed to do high dose steroids    CTA 6/10;/24  Narrative & Impression  CLINICAL DATA:  Worsening shortness of breath and chest discomfort 3 weeks. Elevated D-dimer. Lung carcinoma. Undergoing chemotherapy. * Tracking Code: BO *   EXAM: CT ANGIOGRAPHY CHEST WITH CONTRAST   TECHNIQUE: Multidetector CT imaging of the  chest was performed using the standard protocol during bolus administration of intravenous contrast. Multiplanar CT image reconstructions and MIPs were obtained to evaluate the vascular anatomy.   RADIATION DOSE REDUCTION: This exam was performed according to the departmental dose-optimization program which includes automated exposure control, adjustment of the mA and/or kV according to patient size and/or use of iterative reconstruction technique.   CONTRAST:  75mL OMNIPAQUE  IOHEXOL  350 MG/ML SOLN   COMPARISON:  01/25/2023   FINDINGS: Cardiovascular: Satisfactory opacification of pulmonary arteries noted, and no pulmonary emboli identified. No evidence of thoracic aortic dissection or aneurysm. Aortic and coronary atherosclerotic calcification incidentally noted.   Mediastinum/Nodes: No masses or pathologically enlarged lymph nodes identified.   Lungs/Pleura: Centrilobular and paraseptal emphysema again noted. Chronic changes of subpleural fibrosis and honeycombing again seen with bibasilar predominance. Areas of confluent opacity and central bronchiectasis are again seen in the right upper and lower lobes, without significant change. A poorly defined nodular opacity in the posterior right lower lobe on image 95/5 currently measures 1.9 x 1.8 cm, compared to 1.2 x 1.0 cm previously. No other new or enlarging sites of pulmonary opacity identified. No evidence of pleural effusion.   Upper abdomen: No acute findings.   Musculoskeletal: No suspicious bone lesions identified.   Review of the MIP images confirms the above findings.   IMPRESSION: No evidence of pulmonary embolism.   Increased size of 1.9 cm nodular opacity in posterior right lower lobe, suspicious for carcinoma.   No significant change in areas of right lung airspace disease and bronchiectasis, likely due to post radiation changes.   Chronic pulmonary emphysema and fibrosis.   Aortic Atherosclerosis  (ICD10-I70.0) and Emphysema (ICD10-J43.9).     Electronically Signed   By: Diane [name] Sine M.D.   On: 03/26/2023 16:10      NM PET scan 04/05/23  Narrative & Impression  CLINICAL DATA:  Subsequent treatment strategy for lung carcinoma. Chemotherapy ongoing. Concern for increasing RIGHT lower lobe nodule.   EXAM: NUCLEAR MEDICINE PET SKULL BASE TO THIGH   TECHNIQUE: 7.47 mCi F-18 FDG was injected intravenously. Full-ring PET imaging was performed from the skull base to thigh after the radiotracer. CT data was obtained and used for attenuation correction and anatomic localization.   Fasting blood glucose: 110 mg/dl   COMPARISON:  CT 16/07/9603 com PET-CT 10/04/2022   FINDINGS: Mediastinal blood pool activity: SUV max 2.0   Liver activity: SUV max NA   NECK: No hypermetabolic lymph nodes in the neck.   Incidental CT findings: None.   CHEST: Rounded focus of intense metabolic activity in the RIGHT lower lobe measures 2.7 cm with SUV max equal 15.4. This corresponds to nodule of concern on comparison CT.   Band of parenchymal consolidation in the more superior RIGHT lower lobe has mild metabolic activity favored post radiation change. Band parenchymal consolidation in the RIGHT upper lobe is also similar mild metabolic activity.   No hypermetabolic mediastinal lymph nodes.   Incidental CT findings: None.   ABDOMEN/PELVIS: No abnormal hypermetabolic activity within the liver, pancreas, adrenal glands, or spleen. No hypermetabolic lymph nodes in the abdomen or pelvis.   Incidental CT findings: None.   SKELETON: No focal hypermetabolic activity to suggest skeletal metastasis.   Incidental CT findings: None.   IMPRESSION: 1. Persistent pulmonary nodule in the inferior RIGHT lower lobe with intense metabolic activity is concerning for lung cancer recurrence. 2. Consolidation in the superior aspect of the RIGHT lower lobe and RIGHT upper lobe is similar to prior and  most consistent with post radiation change. 3. No hypermetabolic mediastinal lymph nodes. 4. No distant metastatic disease.     Electronically Signed   By: Deboraha Fallow M.D.   On: 04/09/2023 10:22     OV 08/07/2023  Subjective:  Patient ID: Diane [name], female , DOB: [birth year] , age 38 y.o. , MRN: [id number] , ADDRESS: [address] [place] [address] PCP [name], MD Patient Care Team: [name], MD as PCP - General (Internal Medicine) Riley Cheadle Windsor Hatcher, MD as Consulting Physician (Gastroenterology) Devon Fogo, MD (Inactive) as Consulting Physician (Dermatology)  This Provider for this visit: Treatment Team:  Attending Provider: Maire Scot, MD    08/07/2023 -   Chief Complaint  Patient presents with   Follow-up    Ct f/u,      HPI Diane [name] 68 y.o. -returns for follow-up.  Here with husband was independent historian he says he is doing she is doing well for her combined pulmonary fibrosis and emphysema in the setting of lung cancer.  It is now 3 months since she got radiation.  Confirmed on external medical record review.  Overall she is leading a stable life from a respiratory standpoint this can be evidence in the symptom score below.  She uses 4 L nasal cannula at rest and 8-10 L with oxygen .  She puts oxygen  on a backpack and walks her dog.  She is able to do a third of a mile without stopping.  Today her pulse ox on room air at rest was 75% and she did feel a little winded.  She is able to do her activities of daily living.  She had a CT scan of the chest that I personally visualized and it looks stable.  She has had multiple CT scans of the chest she is going to see oncology next today.  Her cough is controlled with the help of opioids.  I noticed that she not taking any inhalers because it was expensive.  Give her samples of Spiriva to try.  Lab results from October 2024 show improved anemia normal liver function test.  In the  summer 2024 BNP was normal.  Social - Husband suffered stroke and had carotid endarterectomy.  He is here with her today.  And currently is doing well. - She will have high-dose flu shot today.  - Esbriet /Pirfenidone  requires intensive drug monitoring due to high concerns for Adverse effects of , including  Drug Induced Liver Injury, significant GI side effects that include but not limited to Diarrhea, Nausea, Vomiting,  and other system side effects that include Fatigue, headaches, weight loss and other side effects such as skin rash. These will be monitored with  blood work such as LFT initially once a month for 6 months and then quarterly  CT Chest data from date: 08/06/23  - personally visualized and independently interpreted : yes - my findings are: as below  rrative & Impression  CLINICAL DATA:  Non-small cell lung cancer. Assess treatment response. * Tracking Code: BO *   EXAM: CT CHEST WITH CONTRAST   TECHNIQUE: Multidetector CT imaging of the chest was performed during intravenous contrast administration.   RADIATION DOSE REDUCTION: This exam was performed according to the departmental dose-optimization program which includes automated exposure control, adjustment of the mA and/or kV according to patient size and/or use of iterative reconstruction technique.   CONTRAST:  75mL OMNIPAQUE  IOHEXOL  300 MG/ML  SOLN   COMPARISON:  Chest CT 05/18/2023 and prior PET-CT 04/13/2023   FINDINGS: Cardiovascular: The heart is normal in size. No pericardial effusion. Scattered fluid in the pericardial recesses. Stable aortic and branch vessel calcifications but no dissection or aneurysm.   Mediastinum/Nodes: Stable borderline enlarged upper mediastinal lymph nodes. 7 mm node on image 34/2 is stable. Right paratracheal node on image 43/2 measures 9 mm and appears new. 9 mm  precarinal lymph node on image 58/2 is stable. 8 mm AP window node on the same image is also stable. 15 mm  subcarinal node on image 67/2 previously measured 10 mm.   Lungs/Pleura: Stable severe underlying lung disease with bullous emphysema and pulmonary scarring. Overall stable extensive radiation changes involving the right lung with extensive fibrosis and consolidation and bronchiectasis.   The right lower lobe pulmonary nodule is surrounded by soft tissue density, likely radiation change. Difficult to measure a discrete lesion. No new pulmonary nodules are identified.   Upper Abdomen: No significant upper abdominal findings. Stable vascular disease. Status post cholecystectomy.   Musculoskeletal: No breast masses, supraclavicular or axillary adenopathy. No lytic or sclerotic bone lesions are identified.   IMPRESSION: 1. Stable extensive radiation changes involving the right lung. The right lower lobe pulmonary nodule is surrounded by soft tissue density, likely radiation change. Difficult to measure a discrete lesion. No new pulmonary lesions. 2. Stable severe underlying emphysematous changes and pulmonary scarring. 3. New, slightly larger and stable mediastinal lymph nodes as above. 4. No findings for upper abdominal metastatic disease.   Aortic Atherosclerosis (ICD10-I70.0) and Emphysema (ICD10-J43.9).     Electronically Signed   By: Marrian Siva M.D.   On: 08/06/2023 16:46    OV 10/22/2023  Subjective:  Patient ID: Diane [name], female , DOB: Jan 13, 1956 , age 55 y.o. , MRN: [id number] , ADDRESS: [address] [place] [address] PCP [name], MD Patient Care Team: [name], MD as PCP - General (Internal Medicine) Riley Cheadle Windsor Hatcher, MD as Consulting Physician (Gastroenterology) Devon Fogo, MD (Inactive) as Consulting Physician (Dermatology)  This Provider for this visit: Treatment Team:  Attending Provider: Maire Scot, MD    10/22/2023 -   Chief Complaint  Patient presents with   Follow-up    Review CT Chest done 09/25/23. Breathing  is stable. Denies any new co's.    Type of visit: Video Virtual Visit Identification of patient Diane [name] with [birth year] and MRN [id number] - 2 person identifier Risks: Risks, benefits, limitations of telephone visit explained. Patient understood and verbalized agreement to proceed Anyone else on call: husband Patient location: her hime This provider location: [address]; [place]; [place] [address]. [facility]. [phone]   HPI Diane [name] 68 y.o. -on this video visit patient says she is actually doing around the same at 4 L oxygen  at rest and 10 L taking a shower but she does feel more short of breath with exertion she is no longer walking the dog for the last 1 month.  She does not think this is weather related.  She is taking her pirfenidone  at 2 pills 3 times a day [low-dose protocol] and is tolerating it well.  Liver function test recently was normal in December 2024.  She continues to have cough and I did do a refill for her Hycodan.  Conversation revolved around her recent CT scan.  She is getting CT scans every 2 months.  To me it looks stable except that she has ongoing radiation pneumonitis compared to it over a year ago.  6 months ago her ESR was high.  We discussed briefly that if the ESR persists perhaps we could try a course of steroids.  She is open to this idea.  We resolved that we will do a pulmonary function test in the next 8 weeks and reassess.  In addition it appears on the CT scan that I personally visualized  that the lung cancer itself is under remission.  Therefore we entertain the idea of evaluating for pulmonary hypertension.  Will get a BNP and echocardiogram and then decide on right heart catheterization which could be offered through research protocol [Dr. Hunsucker PI] versus standard of care.  She is open to the research protocol idea.  Will get an echocardiogram and BNP. Personally visualized the CT scan below. rrative  & Impression  CLINICAL DATA:  Staging non-small-cell lung cancer. * Tracking Code: BO *   EXAM: CT CHEST WITH CONTRAST   TECHNIQUE: Multidetector CT imaging of the chest was performed during intravenous contrast administration.   RADIATION DOSE REDUCTION: This exam was performed according to the departmental dose-optimization program which includes automated exposure control, adjustment of the mA and/or kV according to patient size and/or use of iterative reconstruction technique.   CONTRAST:  75mL OMNIPAQUE  IOHEXOL  300 MG/ML  SOLN   COMPARISON:  CT 07/31/2023 and older.   FINDINGS: Cardiovascular: Heart is nonenlarged. Trace pericardial fluid is stable. Coronary artery calcifications are seen. The thoracic aorta has a normal course and caliber with scattered partially calcified plaque. Bovine type aortic arch, normal variant.   Mediastinum/Nodes: Preserved thyroid gland. The thoracic esophagus has a normal course and caliber.   There are no abnormal lymph nodes seen in the axillary regions, thoracic inlet, chest wall or internal mammary chains. Prominent mediastinal nodes are again seen. Specific lesions will be followed such as pretracheal in the upper mediastinum which on the prior has a short axis dimension of 7 mm and today 8 mm on series 2, image 38. Right paratracheal node which measured 9 mm on the prior, today on series 2, image 48 measures 9 mm. Precarinal nodes are again seen as well. On the prior the right-sided focus measured 9 mm and left 8 mm in short axis. On today's study, image 62 of series 2, these measure 9 mm and 8 mm, respectively. Prominent nodes along the AP window, subcarinal region are also stable when adjusting for technique. Stable enlarged left hilar node as well on series 2, image 79.   Lungs/Pleura: Advanced emphysematous changes identified including centrilobular and paraseptal. Greatest in the upper lung zones. Left lung also has areas of  interstitial septal thickening, fibrosis and some honeycombing. No left-sided pleural effusion, consolidation or pneumothorax. No new left-sided dominant lung lesion. Stable 5 mm left-sided lung nodule series 6, image 87.   There is debris in the trachea and extending into the right main bronchus. Persistent consolidative opacity seen in the central right upper lobe with distortion and bronchiectasis, unchanged from prior. Consolidative opacities also in the superior segment of the left lower lobe has a similar configuration as well. Small loculated right pleural effusion is similar. Stable small right upper lobe nodule measuring 5 mm on series 2 image 36.   Upper Abdomen: Adrenal glands are incompletely included in the imaging field. The left adrenal gland and at the very edge of the imaging field to slightly thickened unchanged from previous. Right adrenal gland is preserved.   Musculoskeletal: Scattered degenerative changes along the spine.   IMPRESSION: Overall no significant interval change.   Stable consolidative opacities in the right lung with distortion and loculated small effusion. Stable small bilateral lung nodules.   Several borderline mediastinal and hilar nodes are similar to the most recent prior. Recommend continued surveillance.   Aortic Atherosclerosis (ICD10-I70.0) and Emphysema (ICD10-J43.9).     Electronically Signed   By: Otho Blitz.D.  On: 10/04/2023 15:41      OV 02/13/2024  Subjective:  Patient ID: Diane [name], female , DOB: Oct 05, 1956 , age 68 y.o. , MRN: [id number] , ADDRESS: [address] [place] [address] PCP [name], MD Patient Care Team: [name], MD as PCP - General (Internal Medicine) Riley Cheadle Windsor Hatcher, MD as Consulting Physician (Gastroenterology) Devon Fogo, MD (Inactive) as Consulting Physician (Dermatology)  This Provider for this visit: Treatment Team:  Attending Provider: Maire Scot,  MD    02/13/2024 -   Chief Complaint  Patient presents with   Follow-up    ILD   Follow-up - Emphysema - Radiation pneumonitis/lung injury - UIP pattern considered as IPF.  HPI Diane [name] 68 y.o. -returns for follow-up.  Presents with the husband.  She is actually feeling well.  She wants a refill on hydrocodone .  She had pulmonary function test that is stable for over 1 year.  Symptom score is also stable oxygen  use is stable.  Tolerating pirfenidone  quite well.  She had liver function test February 05, 2024 this is normal.  CT scan images were only available at the time she was here but after she left Dr. Adolfo Ahr I emailed her and she got back to me with the formal report.  Severe to call the patient afterwards.  According to Dr. Kareen Osier there is new right lower lobe infiltrate/inflammation.  Of note patient is interested in restarting pulmonary rehabilitation.  She is overall happy with the fact she is stable.    Husband is here with her.  SYMPTOM SCALE - ILD 11/13/2022 03/22/2023 4L [place] rest, 10-15L [place] exertiona 08/07/2023 75% on room air at rest.  Uses 4 L nasal cannula at rest and 8-10 L with exercise.4 02/13/2024 On oxygen .  Current weight      O2 use Exertional walking the dog.     Shortness of Breath 0 -> 5 scale with 5 being worst (score 6 If unable to do)     At rest 0 0 4 0  Simple tasks - showers, clothes change, eating, shaving 3 4 3 5   Household (dishes, doing bed, laundry) 3 4 3 3   Shopping 2 4 3 3   Walking level at own pace 4 4 3 3   Walking up Stairs 4 5 4  na  Total (30-36) Dyspnea Score 16 21 17 14       Non-dyspnea symptoms (0-> 5 scale) 11/13/2022 03/22/2023  08/07/2023  02/13/2024   How bad is your cough? 2 -cough is better after quitting smoking 2 3 3   How bad is your fatigue 2 4 3 4   How bad is nausea 0 2 0 0  How bad is vomiting?  0 0 0 0  How bad is diarrhea? 0 0 0 1  How bad is anxiety? 3 2 3 5   How bad is depression 1 2 3 3   Any chronic  pain - if so where and how bad 0 x  0    Simple office walk 185 feet x  3 laps goal with forehead probe 12/26/2022  12/26/2022   O2 used RA   Number laps completed 1/2 lap of 1 ap   Comments about pace normal   Resting Pulse Ox/HR 94% and 90/min   Final Pulse Ox/HR 87% and 90/min   Desaturated </= 88% yes   Desaturated <= 3% points yes   Got Tachycardic >/= 90/min yes   Symptoms at end of test dyspnea   Miscellaneous comments Waked  on 5L [place] - 96% sitting -> still deats Needed 8-10L to correct and walk all 3 laps     No results found.   PFT     Latest Ref Rng & Units 02/13/2024    1:43 PM 03/21/2023    2:53 PM 12/26/2022    1:53 PM 01/04/2022    9:51 AM  PFT Results  FVC-Pre L 2.00  P 1.85  2.10  2.54   FVC-Predicted Pre % 59  P 54  64  76   FVC-Post L   2.11  2.69   FVC-Predicted Post %   64  81   Pre FEV1/FVC % % 80  P 79  83  73   Post FEV1/FCV % %   81  71   FEV1-Pre L 1.59  P 1.47  1.74  1.84   FEV1-Predicted Pre % 61  P 56  69  72   FEV1-Post L   1.70  1.91   DLCO uncorrected ml/min/mmHg 6.40  P 2.82  6.99  9.15   DLCO UNC% % 30  P 13  34  44   DLCO corrected ml/min/mmHg 6.92  P 3.04  6.99  9.15   DLCO COR %Predicted % 32  P 14  34  44   DLVA Predicted % 52  P 45  55  55   TLC L   3.68  3.94   TLC % Predicted %   70  75   RV % Predicted %   74  62     P Preliminary result    CT CHEST 02/05/24  arrative & Impression  CLINICAL DATA:  Non-small cell lung cancer, radiation therapy and chemotherapy complete. Right upper chest pain. Shortness of breath. * Tracking Code: BO *   EXAM: CT CHEST WITH CONTRAST   TECHNIQUE: Multidetector CT imaging of the chest was performed during intravenous contrast administration.   RADIATION DOSE REDUCTION: This exam was performed according to the departmental dose-optimization program which includes automated exposure control, adjustment of the mA and/or kV according to patient size and/or use of iterative reconstruction  technique.   CONTRAST:  75mL OMNIPAQUE  IOHEXOL  300 MG/ML  SOLN   COMPARISON:  09/25/2023 and 07/31/2023.   FINDINGS: Cardiovascular: Atherosclerotic calcification of the aorta, aortic valve and coronary arteries. Enlarged right and left pulmonary arteries. Heart is at the upper limits of normal in size to mildly enlarged. No pericardial effusion.   Mediastinum/Nodes: No pathologically enlarged mediastinal, hilar or axillary lymph nodes. Esophagus is grossly unremarkable.   Lungs/Pleura: Centrilobular and paraseptal emphysema. Post radiation parenchymal consolidation, traction bronchiectasis and architectural distortion in the perihilar right upper and right lower lobes, stable from 07/31/2023. New collapse/consolidation and/or fluid in the posterolateral inferior right lower lobe. Peripheral and basilar subpleural reticulation and honeycombing. No pleural fluid. Debris in the airway.   Upper Abdomen: Cholecystectomy. Small hiatal hernia. Visualized portions of the liver, adrenal glands, kidneys, spleen, pancreas, stomach and bowel are otherwise grossly unremarkable. No upper abdominal adenopathy.   Musculoskeletal: Degenerative changes in the spine. No worrisome lytic or sclerotic lesions.   IMPRESSION: 1. Post radiation scarring in the right lung, stable from 07/31/2023. 2. New consolidation and/or fluid in the posterolateral inferior right lower lobe, likely infectious/inflammatory in etiology. 3. Basilar subpleural reticulation and honeycombing, indicative of fibrotic interstitial lung disease such as usual interstitial pneumonitis. 4.  Emphysema (ICD10-J43.9). 5. Aortic atherosclerosis (ICD10-I70.0). Coronary artery calcification. 6. Enlarged right and left pulmonary arteries, indicative of pulmonary arterial hypertension.  Electronically Signed   By: Shearon Denis M.D.   On: 02/13/2024 15:38       LAB RESULTS last 96 hours No results  found.  IMPRESSIONS     1. Left ventricular ejection fraction, by estimation, is 55 to 60%. The  left ventricle has normal function. The left ventricle has no regional  wall motion abnormalities. Left ventricular diastolic parameters are  consistent with Grade I diastolic  dysfunction (impaired relaxation).   2. Right ventricular systolic function is low normal. The right  ventricular size is normal. There is normal pulmonary artery systolic  pressure.   3. The mitral valve is normal in structure. No evidence of mitral valve  regurgitation. No evidence of mitral stenosis.   4. The aortic valve is tricuspid. Aortic valve regurgitation is mild. No  aortic stenosis is present. Aortic regurgitation PHT measures 479 msec.   5. The inferior vena cava is normal in size with greater than 50%  respiratory variability, suggesting right atrial pressure of 3 mmHg.   Comparison(s): No significant change from prior study.       has a past medical history of Anxiety, Arthritis, C. difficile diarrhea (2015), COPD (chronic obstructive pulmonary disease) (HCC), Depression, Eczema, H pylori ulcer, Headache(784.0), Hypercholesterolemia, Hypertension, Interstitial lung disease (HCC), Kidney stones, lung ca with recurrence (10/2021), Pneumonia, Seizures (HCC), and Shingles.   reports that she quit smoking about 2 years ago. Her smoking use included cigarettes. She started smoking about 32 years ago. She has a 15 pack-year smoking history. She has never used smokeless tobacco.  Past Surgical History:  Procedure Laterality Date   ABDOMINAL HYSTERECTOMY     ADENOIDECTOMY     APPENDECTOMY     BRONCHIAL BIOPSY  12/20/2021   Procedure: BRONCHIAL BIOPSIES;  Surgeon: Prudy Brownie, DO;  Location: [facility];  Service: Pulmonary;;   BRONCHIAL BRUSHINGS  12/20/2021   Procedure: BRONCHIAL BRUSHINGS;  Surgeon: Prudy Brownie, DO;  Location: [facility];  Service: Pulmonary;;   BRONCHIAL NEEDLE ASPIRATION  BIOPSY  12/20/2021   Procedure: BRONCHIAL NEEDLE ASPIRATION BIOPSIES;  Surgeon: Prudy Brownie, DO;  Location: [facility];  Service: Pulmonary;;   CESAREAN SECTION     CHOLECYSTECTOMY     COLONOSCOPY  06/10/2012   RMR: Colonic polyps -removed as described above.    FINE NEEDLE ASPIRATION  12/20/2021   Procedure: FINE NEEDLE ASPIRATION (FNA) LINEAR;  Surgeon: Prudy Brownie, DO;  Location: [facility];  Service: Pulmonary;;   KNEE ARTHROSCOPY  1973   right knee,  torn cart   LUMBAR DISC SURGERY  05/12/2015   L4 L5   LUMBAR LAMINECTOMY/DECOMPRESSION MICRODISCECTOMY Left 05/12/2015   Procedure: Left L4-5 Microdiscectomy;  Surgeon: Adah Acron, MD;  Location: [facility];  Service: Orthopedics;  Laterality: Left;   TONSILLECTOMY     VIDEO BRONCHOSCOPY WITH ENDOBRONCHIAL ULTRASOUND Bilateral 12/20/2021   Procedure: VIDEO BRONCHOSCOPY WITH ENDOBRONCHIAL ULTRASOUND;  Surgeon: Prudy Brownie, DO;  Location: [facility];  Service: Pulmonary;  Laterality: Bilateral;   VIDEO BRONCHOSCOPY WITH RADIAL ENDOBRONCHIAL ULTRASOUND  12/20/2021   Procedure: RADIAL ENDOBRONCHIAL ULTRASOUND;  Surgeon: Prudy Brownie, DO;  Location: [facility];  Service: Pulmonary;;    Allergies  Allergen Reactions   Alpha-Gal Other (See Comments)    Other reaction(s): Abdominal Pain   Other Other (See Comments)   Penicillins     Unknown type of reaction    Immunization History  Administered Date(s) Administered   Fluad Quad(high Dose 65+) 08/08/2022   Influenza, High  Dose Seasonal PF 08/07/2023   Influenza, Quadrivalent, Recombinant, Inj, Pf 07/24/2019   Influenza,inj,Quad PF,6+ Mos 07/15/2016, 08/03/2017, 08/29/2018   Influenza-Unspecified 07/16/2021   Moderna Sars-Covid-2 Vaccination 12/25/2019, 01/24/2020   PNEUMOCOCCAL CONJUGATE-20 08/08/2022   Pneumococcal Polysaccharide-23 09/29/2013   RSV,unspecified 09/14/2022    Family History  Problem Relation Age of Onset   Arthritis Mother    Asthma Mother     Depression Mother    Hyperlipidemia Mother    Varicose Veins Mother    Arthritis Father    Heart disease Father    Hyperlipidemia Father    Vision loss Father    Cancer Sister    COPD Sister    Early death Sister    Cancer Brother    Alcohol abuse Maternal Grandfather    Colon cancer Neg Hx      Current Outpatient Medications:    albuterol  (PROVENTIL ) (5 MG/ML) 0.5% nebulizer solution, Take 2.5 mg by nebulization every 6 (six) hours as needed for wheezing or shortness of breath., Disp: , Rfl:    Budeson-Glycopyrrol-Formoterol  (BREZTRI  AEROSPHERE) 160-9-4.8 MCG/ACT AERO, Inhale 2 puffs into the lungs in the morning and at bedtime., Disp: 2 each, Rfl: 0   EPINEPHrine  (AUVI-Q ) 0.3 mg/0.3 mL IJ SOAJ injection, Inject 0.3 mLs (0.3 mg total) into the muscle as needed for anaphylaxis., Disp: 2 each, Rfl: 1   fluticasone  (FLONASE ) 50 MCG/ACT nasal spray, Place 2 sprays into both nostrils daily. (Patient taking differently: Place 2 sprays into both nostrils daily as needed for allergies.), Disp: 16 g, Rfl: 5   HYDROcodone  bit-homatropine (HYCODAN) 5-1.5 MG/5ML syrup, Take 5 mLs by mouth every 6 (six) hours as needed for cough (lung cancer and fibrosis)., Disp: 240 mL, Rfl: 0   HYDROcodone  bit-homatropine (HYCODAN) 5-1.5 MG/5ML syrup, Take 5 mLs by mouth every 6 (six) hours as needed for cough., Disp: 240 mL, Rfl: 0   loratadine  (CLARITIN ) 10 MG tablet, Take 10 mg by mouth daily., Disp: , Rfl:    omeprazole  (PRILOSEC) 20 MG capsule, Take 1 capsule (20 mg total) by mouth 2 (two) times daily. (Needs to be seen before next refill), Disp: 60 capsule, Rfl: 0   OVER THE COUNTER MEDICATION, Take 1 capsule by mouth daily. Probiotic, Disp: , Rfl:    Pirfenidone  (ESBRIET ) 267 MG TABS, Take 3 tablets (801 mg total) by mouth 3 (three) times daily with meals. (Account: [email or web address]), Disp: 270 tablet, Rfl: 5   rosuvastatin (CRESTOR) 10 MG tablet, Take 10 mg by mouth at bedtime., Disp: , Rfl:     albuterol  (VENTOLIN  HFA) 108 (90 Base) MCG/ACT inhaler, Inhale 2 puffs into the lungs every 6 (six) hours as needed for wheezing or shortness of breath., Disp: 8 g, Rfl: 2      Objective:   Vitals:   02/13/24 1419  BP: 118/70  Pulse: 93  SpO2: 100%  Weight: 132 lb 4.8 oz (60 kg)  Height: 5\' 5"  (1.651 m)    Estimated body mass index is 22.02 kg/m as calculated from the following:   Height as of this encounter: 5\' 5"  (1.651 m).   Weight as of this encounter: 132 lb 4.8 oz (60 kg).  @WEIGHTCHANGE @  Filed Weights   02/13/24 1419  Weight: 132 lb 4.8 oz (60 kg)     Physical Exam   General: No distress. Looks well O2 at rest: no Cane present: no Sitting in wheel chair: no Frail: no Obese: no Neuro: Alert and Oriented x 3. GCS 15. Speech normal Psych: Pleasant Resp:  Barrel Chest - yes.  Wheeze - no, Crackles - yes, No overt respiratory distress CVS: Normal heart sounds. Murmurs - no Ext: Stigmata of Connective Tissue Disease - no HEENT: Normal upper airway. PEERL +. No post nasal drip        Assessment:       ICD-10-CM   1. IPF (idiopathic pulmonary fibrosis) (HCC)  J84.112 AMB referral to pulmonary rehabilitation    Procalcitonin    Sed Rate (ESR)    2. Pulmonary emphysema, unspecified emphysema type (HCC)  J43.9 AMB referral to pulmonary rehabilitation    Procalcitonin    Sed Rate (ESR)    3. Chronic respiratory failure with hypoxia (HCC)  J96.11 AMB referral to pulmonary rehabilitation    Procalcitonin    Sed Rate (ESR)    4. Chronic cough  R05.3 AMB referral to pulmonary rehabilitation    Procalcitonin    Sed Rate (ESR)         Plan:     Patient Instructions  IPF (idiopathic pulmonary fibrosis) (HCC) with Radiation Fibrosis Pulmonary emphysema, unspecified emphysema type (HCC) History of lung cancer- stgage 2 b with recurrence right lower lobe June 2024 Chronic respiratory failure with hypoxia (HCC)    -stable clinicallyx 1 year - s/p  radiation to lung endingl August 2024 - Severe cough due to fibrosis and lung cancer Hycodan helping - no flare up or clniical worsening of respiratory failure - glad you are stable  - no evidence of pulmonary hypertension on echo early 2025 - LFT normal April2025  Plan   -Continue oxygen  4 L at rest [likely need 4 L nasal cannula at night as well] and use 8-12 L with exertion  -For cough  - continue Hycodan 5 mL twice daily for palliative relief of both shortness of breath and cough  - refill done 02/13/2024    - keep active  - continue esbriet  (pirfenidone ) as before; low dose protocol  - for associated emphysema  -continue spiriva  - await formal radiology report  - will ask radiologist if there is potential role for steroids for the radiation pneumniitisi  - refer pulmnary rehab  -Addendum based on CT report is to get ESR and procalcitonin.  If the ESR is high but procalcitonin is normal we will give her a course of steroids   Plan = 16 weeks x 15 min visit        FOLLOWUP Return in about 3 months (around 05/14/2024) for 15 min visit, Chronic Respiratory Failure, with Dr Bertrum Brodie, Face to Face Visit.    SIGNATURE    Dr. Maire Scot, M.D., F.C.C.P,  Pulmonary and Critical Care Medicine Staff Physician, [facility] Center Director - Interstitial Lung Disease  Program  Pulmonary Fibrosis [facility] [place], [place], [address]  Pager: [phone], If no answer or between  15:00h - 7:00h: call [phone] Telephone: [phone]  5:15 PM 02/13/2024   ( Level 05 visit E&M 2024: Estb >= 40 min n  visit type: on-site physical face to visit  in total care time and counseling or/and coordination of care by this undersigned MD - Dr Maire Scot. This includes one or more of the following on this same day 02/13/2024: pre-charting, chart review, note writing, documentation discussion of test results,  diagnostic or treatment recommendations, prognosis, risks and benefits of management options, instructions, education, compliance or risk-factor reduction. It excludes time spent by the CMA or office staff  in the care of the patient. Actual time 45 min)

## 2024-02-13 NOTE — Telephone Encounter (Signed)
 Let patient know that radiology finally read the report after email them.  There is no recurrence of cancer but they are saying there are some new right lower lobe infiltrate that looks like inflammation/pneumonia.  I personally am not convinced about it but she should check blood work for procalcitonin and sedimentation rate.  If the procalcitonin is high can put her on a course of antibiotics.  On the other hand if the ESR is high would want to give her some steroids to see if we can shrink off some of the radiation related inflammation

## 2024-02-13 NOTE — Progress Notes (Signed)
 Spirometry/DLCO performed today.

## 2024-02-13 NOTE — Telephone Encounter (Signed)
 Pt notified on phone and MY chart sent

## 2024-02-14 ENCOUNTER — Other Ambulatory Visit: Payer: Self-pay

## 2024-02-14 ENCOUNTER — Emergency Department (HOSPITAL_COMMUNITY)
Admission: EM | Admit: 2024-02-14 | Discharge: 2024-02-15 | Disposition: A | Discharge status: Home / Self Care | Attending: Emergency Medicine | Admitting: Emergency Medicine

## 2024-02-14 ENCOUNTER — Emergency Department (HOSPITAL_COMMUNITY)

## 2024-02-14 DIAGNOSIS — R079 Chest pain, unspecified: Secondary | ICD-10-CM | POA: Diagnosis present

## 2024-02-14 DIAGNOSIS — R091 Pleurisy: Secondary | ICD-10-CM

## 2024-02-14 DIAGNOSIS — J189 Pneumonia, unspecified organism: Secondary | ICD-10-CM

## 2024-02-14 DIAGNOSIS — Z85118 Personal history of other malignant neoplasm of bronchus and lung: Secondary | ICD-10-CM | POA: Insufficient documentation

## 2024-02-14 DIAGNOSIS — J181 Lobar pneumonia, unspecified organism: Secondary | ICD-10-CM | POA: Diagnosis not present

## 2024-02-14 LAB — CBC WITH DIFFERENTIAL/PLATELET
Abs Immature Granulocytes: 0.03 10*3/uL (ref 0.00–0.07)
Basophils Absolute: 0 10*3/uL (ref 0.0–0.1)
Basophils Relative: 1 %
Eosinophils Absolute: 0.3 10*3/uL (ref 0.0–0.5)
Eosinophils Relative: 4 %
HCT: 32.5 % — ABNORMAL LOW (ref 36.0–46.0)
Hemoglobin: 10.7 g/dL — ABNORMAL LOW (ref 12.0–15.0)
Immature Granulocytes: 1 %
Lymphocytes Relative: 20 %
Lymphs Abs: 1.3 10*3/uL (ref 0.7–4.0)
MCH: 30.7 pg (ref 26.0–34.0)
MCHC: 32.9 g/dL (ref 30.0–36.0)
MCV: 93.1 fL (ref 80.0–100.0)
Monocytes Absolute: 0.5 10*3/uL (ref 0.1–1.0)
Monocytes Relative: 7 %
Neutro Abs: 4.4 10*3/uL (ref 1.7–7.7)
Neutrophils Relative %: 67 %
Platelets: 278 10*3/uL (ref 150–400)
RBC: 3.49 MIL/uL — ABNORMAL LOW (ref 3.87–5.11)
RDW: 12.4 % (ref 11.5–15.5)
WBC: 6.5 10*3/uL (ref 4.0–10.5)
nRBC: 0 % (ref 0.0–0.2)

## 2024-02-14 LAB — COMPREHENSIVE METABOLIC PANEL WITH GFR
ALT: 31 U/L (ref 0–44)
AST: 35 U/L (ref 15–41)
Albumin: 3.6 g/dL (ref 3.5–5.0)
Alkaline Phosphatase: 73 U/L (ref 38–126)
Anion gap: 12 (ref 5–15)
BUN: 13 mg/dL (ref 8–23)
CO2: 24 mmol/L (ref 22–32)
Calcium: 9.3 mg/dL (ref 8.9–10.3)
Chloride: 100 mmol/L (ref 98–111)
Creatinine, Ser: 1.1 mg/dL — ABNORMAL HIGH (ref 0.44–1.00)
GFR, Estimated: 55 mL/min — ABNORMAL LOW (ref >60)
Glucose, Bld: 108 mg/dL — ABNORMAL HIGH (ref 70–99)
Potassium: 3.9 mmol/L (ref 3.5–5.1)
Sodium: 136 mmol/L (ref 135–145)
Total Bilirubin: 0.5 mg/dL (ref 0.0–1.2)
Total Protein: 8.6 g/dL — ABNORMAL HIGH (ref 6.5–8.1)

## 2024-02-14 LAB — RESP PANEL BY RT-PCR (RSV, FLU A&B, COVID)  RVPGX2
Influenza A by PCR: NEGATIVE
Influenza B by PCR: NEGATIVE
Resp Syncytial Virus by PCR: NEGATIVE
SARS Coronavirus 2 by RT PCR: NEGATIVE

## 2024-02-14 LAB — TROPONIN I (HIGH SENSITIVITY): Troponin I (High Sensitivity): 4 ng/L (ref <18)

## 2024-02-14 LAB — SEDIMENTATION RATE: Sed Rate: 33 mm/h — ABNORMAL HIGH (ref 0–30)

## 2024-02-14 NOTE — ED Triage Notes (Signed)
 Patient reports worsening right lateral chest pain / upper back pain with SOB onset yesterday , no emesis or diaphoresis .

## 2024-02-14 NOTE — ED Provider Triage Note (Signed)
 Emergency Medicine Provider Triage Evaluation Note  Diane [name] , a 68 y.o. female  was evaluated in triage.  Pt complains of chest pain, back pain.  Patient reports ongoing issues with right-sided low back/chest wall pain for about a week.  States that the shortness of breath is notably worse in the last day or so.  Denies any recent fever, chills or bodyaches.  Had a CT scan done 02/05/2024 which did show some increased fluid buildup at the right lower lobe concerning for possible infectious versus inflammatory etiology.  Review of Systems  Positive: As above Negative: As above  Physical Exam  BP (!) 142/73 (BP Location: Right Arm)   Pulse 92   Temp 98 F (36.7 C)   Resp 18   SpO2 98%  Gen:   Awake, no distress   Resp:  Increased work of breathing, on oxygen  which is baseline, crackles heard to bilateral lung bases with right lower lobe more notable MSK:   Moves extremities without difficulty  Other:    Medical Decision Making  Medically screening exam initiated at 9:32 PM.  Appropriate orders placed.  Diane [name] was informed that the remainder of the evaluation will be completed by another provider, this initial triage assessment does not replace that evaluation, and the importance of remaining in the ED until their evaluation is complete.     Diane [name] A, PA-C 02/14/24 2133

## 2024-02-14 NOTE — Telephone Encounter (Signed)
Pt was notified of results yesterday.

## 2024-02-14 NOTE — Addendum Note (Signed)
 Addended by: Shahab Polhamus M on: 02/14/2024 10:42 AM   Modules accepted: Orders

## 2024-02-15 DIAGNOSIS — J181 Lobar pneumonia, unspecified organism: Secondary | ICD-10-CM | POA: Diagnosis not present

## 2024-02-15 LAB — URINALYSIS, ROUTINE W REFLEX MICROSCOPIC
Bilirubin Urine: NEGATIVE
Glucose, UA: NEGATIVE mg/dL
Hgb urine dipstick: NEGATIVE
Ketones, ur: NEGATIVE mg/dL
Nitrite: NEGATIVE
Protein, ur: NEGATIVE mg/dL
Specific Gravity, Urine: 1.014 (ref 1.005–1.030)
pH: 5 (ref 5.0–8.0)

## 2024-02-15 LAB — TROPONIN I (HIGH SENSITIVITY): Troponin I (High Sensitivity): 4 ng/L (ref <18)

## 2024-02-15 LAB — D-DIMER, QUANTITATIVE: D-Dimer, Quant: 0.35 ug{FEU}/mL (ref 0.00–0.50)

## 2024-02-15 MED ORDER — PREDNISONE 10 MG PO TABS: ORAL_TABLET | ORAL | 0 refills | Status: AC

## 2024-02-15 MED ORDER — LEVOFLOXACIN 500 MG PO TABS
500.0000 mg | ORAL_TABLET | Freq: Once | ORAL | Status: AC
  Administered 2024-02-15: 500 mg via ORAL
  Filled 2024-02-15: qty 1

## 2024-02-15 MED ORDER — SODIUM CHLORIDE 0.9 % IV BOLUS
500.0000 mL | Freq: Once | INTRAVENOUS | Status: AC
  Administered 2024-02-15: 500 mL via INTRAVENOUS

## 2024-02-15 MED ORDER — LIDOCAINE 5 % EX PTCH
1.0000 | MEDICATED_PATCH | CUTANEOUS | Status: DC
  Administered 2024-02-15: 1 via TRANSDERMAL
  Filled 2024-02-15: qty 1

## 2024-02-15 MED ORDER — LEVOFLOXACIN 500 MG PO TABS: 500.0000 mg | ORAL_TABLET | Freq: Every day | ORAL | 0 refills | Status: DC

## 2024-02-15 MED ORDER — LIDOCAINE 5 % EX PTCH: 1.0000 | MEDICATED_PATCH | CUTANEOUS | 0 refills | Status: DC

## 2024-02-15 MED ORDER — METHYLPREDNISOLONE SODIUM SUCC 125 MG IJ SOLR
60.0000 mg | Freq: Once | INTRAMUSCULAR | Status: AC
  Administered 2024-02-15: 60 mg via INTRAVENOUS
  Filled 2024-02-15: qty 2

## 2024-02-15 MED ORDER — TIZANIDINE HCL 2 MG PO CAPS: 2.0000 mg | ORAL_CAPSULE | Freq: Three times a day (TID) | ORAL | 0 refills | Status: DC | PRN

## 2024-02-15 MED ORDER — FENTANYL CITRATE PF 50 MCG/ML IJ SOSY
50.0000 ug | PREFILLED_SYRINGE | Freq: Once | INTRAMUSCULAR | Status: AC
  Administered 2024-02-15: 50 ug via INTRAVENOUS
  Filled 2024-02-15: qty 1

## 2024-02-15 NOTE — ED Notes (Signed)
 Message sent to main pharmacy to send patient's levofloxacin  to [facility] tube station in ED.

## 2024-02-15 NOTE — ED Provider Notes (Signed)
 [place] EMERGENCY DEPARTMENT AT [facility] Provider Note   CSN: [id number] Arrival date & time: 02/14/24  2111     History  Chief Complaint  Patient presents with   Chest Pain   Back Pain    Diane [name] is a 68 y.o. female.  The history is provided by the patient.  Chest Pain Associated symptoms: back pain   Back Pain Associated symptoms: chest pain   Diane [name] is a 68 y.o. female who presents to the Emergency Department complaining of chest pain.  She presents to the emergency department for evaluation of right-sided chest pain that started 2 weeks ago but significantly worsened over the last 24 hours.  Pain is sharp and worse with movement as well as palpation.  She has been feeling unwell for the last several weeks.  She has a history of lung cancer status post chemo and radiation as well as pulmonary fibrosis.  She is on 4 L nasal cannula at rest and 10 L with activity.  She does not have a cough in her shortness of breath is baseline.  No associated fever, abdominal pain, nausea, vomiting, dysuria.  She had a CT of the chest for screening 10 days ago that demonstrated infiltrate.  She is still pending workup for this for treatment with antibiotics versus prednisone .  She presents to the emergency department due to significant worsening of her pain. Home Medications Prior to Admission medications   Medication Sig Start Date End Date Taking? Authorizing Provider  levofloxacin  (LEVAQUIN ) 500 MG tablet Take 1 tablet (500 mg total) by mouth daily. 02/15/24  Yes Kelsey Patricia, MD  lidocaine  (LIDODERM ) 5 % Place 1 patch onto the skin daily. Remove & Discard patch within 12 hours or as directed by MD 02/15/24  Yes Kelsey Patricia, MD  predniSONE  (DELTASONE ) 10 MG tablet Take 4 tablets (40 mg total) by mouth daily for 3 days, THEN 3 tablets (30 mg total) daily for 3 days, THEN 2 tablets (20 mg total) daily for 3 days, THEN 1 tablet (10 mg total) daily for 3  days. 02/15/24 02/27/24 Yes Kelsey Patricia, MD  tizanidine (ZANAFLEX) 2 MG capsule Take 1 capsule (2 mg total) by mouth 3 (three) times daily as needed for muscle spasms. 02/15/24  Yes Kelsey Patricia, MD  albuterol  (PROVENTIL ) (5 MG/ML) 0.5% nebulizer solution Take 2.5 mg by nebulization every 6 (six) hours as needed for wheezing or shortness of breath.    [provider]  albuterol  (VENTOLIN  HFA) 108 (90 Base) MCG/ACT inhaler Inhale 2 puffs into the lungs every 6 (six) hours as needed for wheezing or shortness of breath. 02/06/23   Raejean Bullock, NP  Budeson-Glycopyrrol-Formoterol  (BREZTRI  AEROSPHERE) 160-9-4.8 MCG/ACT AERO Inhale 2 puffs into the lungs in the morning and at bedtime. 02/06/23   Raejean Bullock, NP  EPINEPHrine  (AUVI-Q ) 0.3 mg/0.3 mL IJ SOAJ injection Inject 0.3 mLs (0.3 mg total) into the muscle as needed for anaphylaxis. 04/29/19   Bobbitt, Colen Daunt, MD  fluticasone  (FLONASE ) 50 MCG/ACT nasal spray Place 2 sprays into both nostrils daily. Patient taking differently: Place 2 sprays into both nostrils daily as needed for allergies. 02/18/20   Zorita Hiss, FNP  HYDROcodone  bit-homatropine (HYCODAN) 5-1.5 MG/5ML syrup Take 5 mLs by mouth every 6 (six) hours as needed for cough (lung cancer and fibrosis). 01/21/24   Maire Scot, MD  HYDROcodone  bit-homatropine (HYCODAN) 5-1.5 MG/5ML syrup Take 5 mLs by mouth every 6 (six) hours as needed for cough. 02/13/24  Maire Scot, MD  loratadine  (CLARITIN ) 10 MG tablet Take 10 mg by mouth daily.    [provider]  omeprazole  (PRILOSEC) 20 MG capsule Take 1 capsule (20 mg total) by mouth 2 (two) times daily. (Needs to be seen before next refill) 11/15/20   Zorita Hiss, FNP  OVER THE COUNTER MEDICATION Take 1 capsule by mouth daily. Probiotic    [provider]  Pirfenidone  (ESBRIET ) 267 MG TABS Take 3 tablets (801 mg total) by mouth 3 (three) times daily with meals. (Account: [email or web address])  11/14/23   Maire Scot, MD  rosuvastatin (CRESTOR) 10 MG tablet Take 10 mg by mouth at bedtime. 12/05/21   [provider]      Allergies    Alpha-gal, Other, and Penicillins    Review of Systems   Review of Systems  Cardiovascular:  Positive for chest pain.  Musculoskeletal:  Positive for back pain.  All other systems reviewed and are negative.   Physical Exam Updated Vital Signs BP (!) 141/70   Pulse 74   Temp (!) 97.3 F (36.3 C)   Resp 18   SpO2 100%  Physical Exam Vitals and nursing note reviewed.  Constitutional:      Appearance: She is well-developed.  HENT:     Head: Normocephalic and atraumatic.  Cardiovascular:     Rate and Rhythm: Normal rate and regular rhythm.     Heart sounds: No murmur heard. Pulmonary:     Effort: Pulmonary effort is normal. No respiratory distress.     Comments: Occasional fine crackles in the bases Chest:     Chest wall: Tenderness present.  Abdominal:     Palpations: Abdomen is soft.     Tenderness: There is no abdominal tenderness. There is right CVA tenderness. There is no guarding or rebound.  Musculoskeletal:        General: No swelling or tenderness.     Comments: 2+ DP pulses bilaterally  Skin:    General: Skin is warm and dry.  Neurological:     Mental Status: She is alert and oriented to person, place, and time.  Psychiatric:        Behavior: Behavior normal.     ED Results / Procedures / Treatments   Labs (all labs ordered are listed, but only abnormal results are displayed) Labs Reviewed  CBC WITH DIFFERENTIAL/PLATELET - Abnormal; Notable for the following components:      Result Value   RBC 3.49 (*)    Hemoglobin 10.7 (*)    HCT 32.5 (*)    All other components within normal limits  COMPREHENSIVE METABOLIC PANEL WITH GFR - Abnormal; Notable for the following components:   Glucose, Bld 108 (*)    Creatinine, Ser 1.10 (*)    Total Protein 8.6 (*)    GFR, Estimated 55 (*)    All other components  within normal limits  URINALYSIS, ROUTINE W REFLEX MICROSCOPIC - Abnormal; Notable for the following components:   Leukocytes,Ua SMALL (*)    Bacteria, UA RARE (*)    All other components within normal limits  RESP PANEL BY RT-PCR (RSV, FLU A&B, COVID)  [id number]  D-DIMER, QUANTITATIVE  TROPONIN I (HIGH SENSITIVITY)  TROPONIN I (HIGH SENSITIVITY)    EKG EKG Interpretation Date/Time:  Thursday Feb 14 2024 21:25:53 EDT Ventricular Rate:  84 PR Interval:  148 QRS Duration:  72 QT Interval:  392 QTC Calculation: 463 R Axis:   58  Text Interpretation: Normal sinus rhythm Right atrial enlargement  Borderline ECG Confirmed by Kelsey Patricia [phone]) on 02/15/2024 12:29:31 AM  Radiology DG Chest 2 View Result Date: 02/14/2024 CLINICAL DATA:  Chest pain, dyspnea EXAM: CHEST - 2 VIEW COMPARISON:  05/18/2023 FINDINGS: Chronic consolidation within the right mid lung zone appears stable. Consolidation within the right lung base has slightly progressed since prior examination. These findings are better assessed on prior CT examination of 02/05/2024. Bibasilar pulmonary fibrotic changes noted. No pneumothorax or pleural effusion. Cardiac size within limits. No acute bone abnormality. IMPRESSION: 1. Progressive consolidation within the right lung base, possibly infectious or inflammatory in nature. This is better assessed on CT examination of 02/05/2024. Electronically Signed   By: Worthy Heads M.D.   On: 02/14/2024 21:55    Procedures Procedures    Medications Ordered in ED Medications  lidocaine  (LIDODERM ) 5 % 1 patch (1 patch Transdermal Patch Applied 02/15/24 0130)  sodium chloride  0.9 % bolus 500 mL (0 mLs Intravenous Stopped 02/15/24 0355)  fentaNYL  (SUBLIMAZE ) injection 50 mcg (50 mcg Intravenous Given 02/15/24 0127)  methylPREDNISolone  sodium succinate (SOLU-MEDROL ) 125 mg/2 mL injection 60 mg (60 mg Intravenous Given 02/15/24 0126)  levofloxacin  (LEVAQUIN ) tablet 500 mg (500 mg Oral Given 02/15/24  6045)    ED Course/ Medical Decision Making/ A&P                                 Medical Decision Making Amount and/or Complexity of Data Reviewed Labs: ordered.  Risk Prescription drug management.   Patient with history of lung cancer status postradiation and chemo as well as pulmonary fibrosis on chronic oxygen  here for evaluation of right-sided chest pain.  Chest x-ray does demonstrate progressive infiltrate, infectious versus inflammatory-images personally reviewed and interpreted, agree with radiologist interpretation.  CMP with mild elevation in creatinine when compared to prior-she was treated with IV fluids.  CBC with stable anemia.  Presentation is not consistent with ACS, troponin is negative x 2.  Picture is not consistent with PE, D-dimer is negative.  Will start on antibiotics for possible community-acquired pneumonia as well as prednisone  taper for potential inflammatory component to her symptoms.  Symptoms are significantly improved after pain management in the emergency department.  Feel she is stable for discharge with close outpatient follow-up and return precautions.        Final Clinical Impression(s) / ED Diagnoses Final diagnoses:  Community acquired pneumonia of right lower lobe of lung  Pleurisy    Rx / DC Orders ED Discharge Orders          Ordered    lidocaine  (LIDODERM ) 5 %  Every 24 hours        02/15/24 0352    tizanidine (ZANAFLEX) 2 MG capsule  3 times daily PRN        02/15/24 0352    levofloxacin  (LEVAQUIN ) 500 MG tablet  Daily        02/15/24 0352    predniSONE  (DELTASONE ) 10 MG tablet  Daily        02/15/24 0353              Kelsey Patricia, MD 02/15/24 [phone]

## 2024-02-18 ENCOUNTER — Telehealth (HOSPITAL_COMMUNITY): Payer: Self-pay | Admitting: *Deleted

## 2024-02-18 ENCOUNTER — Telehealth: Payer: Self-pay | Admitting: Internal Medicine

## 2024-02-18 NOTE — Telephone Encounter (Signed)
 Ok reviewed EDP notes. Will sit tight. Have her call us  back in 1 week or so an dwe can decide

## 2024-02-18 NOTE — Telephone Encounter (Signed)
    Lslie   Katilynn Dirden  let her know that ESR is down but also range bound high 30s to low 50s. Given the new infiltrate - I like to treat her with 1 -2 months steroids and see if there is any active lung infilamation that could improve  LMK and I can let you know the regiment  Thanks    SIGNATURE    Dr. Maire Scot, M.D., F.C.C.P,  Pulmonary and Critical Care Medicine Staff Physician, [facility] Center Director - Interstitial Lung Disease  Program  Pulmonary Fibrosis [facility] [place], [place], [address]   Pager: [phone], If no answer  -> Check AMION or Try [phone] Telephone (clinical office): [phone] Telephone (research): [phone]  5:44 AM 02/18/2024

## 2024-02-18 NOTE — Telephone Encounter (Signed)
 I called and spoke with the pt She verbalized understanding  She is willing try steroids, but it currently on a taper of pred and abx per ED She went to ED with CP on 02/14/24- see notes in Epic Please advise, thanks!

## 2024-02-19 ENCOUNTER — Telehealth: Payer: Self-pay | Admitting: Internal Medicine

## 2024-02-19 ENCOUNTER — Inpatient Hospital Stay: Payer: Medicare Other | Admitting: Internal Medicine

## 2024-02-19 NOTE — Telephone Encounter (Signed)
 The patients husband left me a voicemail informing me that the patient has pneumonia and would not make her appointment today. Called the patients husband back and scheduled a MyChart visit with the patient next week. The patients husband confirmed appointment details.

## 2024-02-19 NOTE — Telephone Encounter (Signed)
I called and spoke with the pt and notified of response per MR  She verbalized understanding  Nothing further needed

## 2024-02-20 ENCOUNTER — Telehealth (HOSPITAL_COMMUNITY): Payer: Self-pay | Admitting: *Deleted

## 2024-02-20 NOTE — Telephone Encounter (Signed)
 Diane [name] sts she is not feeling up to PR right now however she is not opposed to trying in a few weeks. Will not close referral yet. She will call us .  German Koller BS, ACSM-CEP 02/20/2024 3:05 PM

## 2024-02-21 LAB — PROCALCITONIN: Procalcitonin: <0.1 ng/mL (ref <0.10)

## 2024-02-22 ENCOUNTER — Telehealth: Payer: Self-pay | Admitting: Internal Medicine

## 2024-02-22 ENCOUNTER — Telehealth (HOSPITAL_COMMUNITY): Payer: Self-pay | Admitting: *Deleted

## 2024-02-22 NOTE — Telephone Encounter (Signed)
 grace from [employer] is calling because they sent over a oxygen  machine script through [employer] and needs the doctor to sign off, phone [phone]   Issues w/fax machine. Not rec'd

## 2024-02-22 NOTE — Telephone Encounter (Signed)
 Diane [name] called and stated she is feeling better and is ready to schedule for PR orientation. Scheduled orientation 02/29/24 at 10:30. She will begin exercise 03/04/24 at 10:15. Will send information to pt in MyChart message regarding location and instructions.  German Koller BS, ACSM-CEP 02/22/2024 1:13 PM

## 2024-02-26 ENCOUNTER — Encounter: Payer: Self-pay | Admitting: Internal Medicine

## 2024-02-26 ENCOUNTER — Inpatient Hospital Stay: Attending: Internal Medicine | Admitting: Internal Medicine

## 2024-02-26 ENCOUNTER — Telehealth: Payer: Self-pay | Admitting: Internal Medicine

## 2024-02-26 DIAGNOSIS — Z9221 Personal history of antineoplastic chemotherapy: Secondary | ICD-10-CM | POA: Insufficient documentation

## 2024-02-26 DIAGNOSIS — C3490 Malignant neoplasm of unspecified part of unspecified bronchus or lung: Secondary | ICD-10-CM | POA: Diagnosis not present

## 2024-02-26 DIAGNOSIS — J984 Other disorders of lung: Secondary | ICD-10-CM

## 2024-02-26 DIAGNOSIS — C3491 Malignant neoplasm of unspecified part of right bronchus or lung: Secondary | ICD-10-CM

## 2024-02-26 DIAGNOSIS — Z923 Personal history of irradiation: Secondary | ICD-10-CM | POA: Insufficient documentation

## 2024-02-26 DIAGNOSIS — Z85118 Personal history of other malignant neoplasm of bronchus and lung: Secondary | ICD-10-CM | POA: Insufficient documentation

## 2024-02-26 DIAGNOSIS — J84112 Idiopathic pulmonary fibrosis: Secondary | ICD-10-CM

## 2024-02-26 NOTE — Telephone Encounter (Signed)
 [facility] sending fax for O2. I will fwd to Dr. Bertrum Brodie for signature.

## 2024-02-26 NOTE — Telephone Encounter (Signed)
 PT writes in [place] Msg: I have completed the Prednisone  given to me at the ED. I began to feel better for a few days but am now in A LOT of pain again in my chest...right side. I am tender to the touch. I've rested and am getting no results. What do you propose at this point? Its getting concerning to be in this much discomfort for this long. Dovyou think more steroids are the answer??

## 2024-02-26 NOTE — Telephone Encounter (Signed)
**Note De-identified  [place] Obfuscation** Please advise 

## 2024-02-26 NOTE — Telephone Encounter (Signed)
 I think worth trihg 8-12 weeks pednisone courrse   LMK

## 2024-02-26 NOTE — Progress Notes (Signed)
 [facility] [phone]   [phone]  PROGRESS NOTE FOR TELEMEDICINE VISITS  [name], MD [address] [place] [address]  I connected withNAME@ on 02/26/24 at  8:30 AM EDT by video enabled telemedicine visit and verified that I am speaking with the correct person using two identifiers.   I discussed the limitations, risks, security and privacy concerns of performing an evaluation and management service by telemedicine and the availability of in-person appointments. I also discussed with the patient that there may be a patient responsible charge related to this service. The patient expressed understanding and agreed to proceed.  Other persons participating in the visit and their role in the encounter: Husband  Patient's location:  Home Provider's location: [facility]  DIAGNOSIS: Unresectable stage IIB (T2a, N1, M0) non-small cell lung cancer, squamous cell carcinoma presented with right lower lobe lung mass in addition to right hilar lymphadenopathy diagnosed in March 2023.     The patient had PD-L1 expression of 99%.   PRIOR THERAPY: A course of concurrent chemoradiation with weekly carboplatin  for AUC of 2 and paclitaxel  45 Mg/M2.  First dose started 01/16/2022.  Status post 7 cycles.  Last dose was given on 02/27/2022.   CURRENT THERAPY: Observation.  INTERVAL HISTORY: Diane [name] 68 y.o. female has a MyChart virtual video visit with me today for evaluation and discussion of her scan results.  She was accompanied by her husband.Discussed the use of AI scribe software for clinical note transcription with the patient, who gave verbal consent to proceed.  History of Present Illness   Diane [name] is a 68 year old female with unresectable stage 2B non-small cell lung cancer who presents for evaluation and discussion of her scan results.  She experiences severe right-sided chest pain located at the mid-lung ribcage,  corresponding to the area previously treated with radiation. This pain has prompted her to seek further evaluation.  She uses high flow oxygen  at 4 liters per minute at rest, increasing to 8 to 10 liters per minute with any activity. There have been no recent changes to her oxygen  therapy regimen.  She has a history of unresectable stage 2B non-small cell lung cancer, squamous cell carcinoma, diagnosed in March 2023. She completed a course of concurrent chemoradiation with weekly carboplatin  and paclitaxel  in May 2023 and has been under observation since then.  She recently saw her pulmonologist about a week ago, but no changes were made to her treatment plan at that time. The scan results were not available during her last visit, leading to some confusion.  No changes in her breathing since the last visit and she continues on home oxygen  therapy.      MEDICAL HISTORY: Past Medical History:  Diagnosis Date   Anxiety    Arthritis    fingers, left foot   C. difficile diarrhea 2015   COPD (chronic obstructive pulmonary disease) (HCC)    Depression    Eczema    H pylori ulcer    Headache(784.0)    Hypercholesterolemia    Hypertension    Interstitial lung disease (HCC)    Kidney stones    20 years ago   lung ca with recurrence 10/2021   01/2023   Pneumonia    5 years ago   Seizures (HCC)    2 years ago, "cluster of seizures" none since   Shingles     ALLERGIES:  is allergic to alpha-gal, other, and penicillins.  MEDICATIONS:  Current  Outpatient Medications  Medication Sig Dispense Refill   albuterol  (PROVENTIL ) (5 MG/ML) 0.5% nebulizer solution Take 2.5 mg by nebulization every 6 (six) hours as needed for wheezing or shortness of breath.     albuterol  (VENTOLIN  HFA) 108 (90 Base) MCG/ACT inhaler Inhale 2 puffs into the lungs every 6 (six) hours as needed for wheezing or shortness of breath. 8 g 2   Budeson-Glycopyrrol-Formoterol  (BREZTRI  AEROSPHERE) 160-9-4.8 MCG/ACT AERO Inhale  2 puffs into the lungs in the morning and at bedtime. 2 each 0   EPINEPHrine  (AUVI-Q ) 0.3 mg/0.3 mL IJ SOAJ injection Inject 0.3 mLs (0.3 mg total) into the muscle as needed for anaphylaxis. 2 each 1   fluticasone  (FLONASE ) 50 MCG/ACT nasal spray Place 2 sprays into both nostrils daily. (Patient taking differently: Place 2 sprays into both nostrils daily as needed for allergies.) 16 g 5   HYDROcodone  bit-homatropine (HYCODAN) 5-1.5 MG/5ML syrup Take 5 mLs by mouth every 6 (six) hours as needed for cough (lung cancer and fibrosis). 240 mL 0   HYDROcodone  bit-homatropine (HYCODAN) 5-1.5 MG/5ML syrup Take 5 mLs by mouth every 6 (six) hours as needed for cough. 240 mL 0   levofloxacin  (LEVAQUIN ) 500 MG tablet Take 1 tablet (500 mg total) by mouth daily. 6 tablet 0   lidocaine  (LIDODERM ) 5 % Place 1 patch onto the skin daily. Remove & Discard patch within 12 hours or as directed by MD 30 patch 0   loratadine  (CLARITIN ) 10 MG tablet Take 10 mg by mouth daily.     omeprazole  (PRILOSEC) 20 MG capsule Take 1 capsule (20 mg total) by mouth 2 (two) times daily. (Needs to be seen before next refill) 60 capsule 0   OVER THE COUNTER MEDICATION Take 1 capsule by mouth daily. Probiotic     Pirfenidone  (ESBRIET ) 267 MG TABS Take 3 tablets (801 mg total) by mouth 3 (three) times daily with meals. (Account: [email or web address]) 270 tablet 5   predniSONE  (DELTASONE ) 10 MG tablet Take 4 tablets (40 mg total) by mouth daily for 3 days, THEN 3 tablets (30 mg total) daily for 3 days, THEN 2 tablets (20 mg total) daily for 3 days, THEN 1 tablet (10 mg total) daily for 3 days. 30 tablet 0   rosuvastatin (CRESTOR) 10 MG tablet Take 10 mg by mouth at bedtime.     tizanidine  (ZANAFLEX ) 2 MG capsule Take 1 capsule (2 mg total) by mouth 3 (three) times daily as needed for muscle spasms. 30 capsule 0   No current facility-administered medications for this visit.    SURGICAL HISTORY:  Past Surgical History:  Procedure  Laterality Date   ABDOMINAL HYSTERECTOMY     ADENOIDECTOMY     APPENDECTOMY     BRONCHIAL BIOPSY  12/20/2021   Procedure: BRONCHIAL BIOPSIES;  Surgeon: Prudy Brownie, DO;  Location: [facility];  Service: Pulmonary;;   BRONCHIAL BRUSHINGS  12/20/2021   Procedure: BRONCHIAL BRUSHINGS;  Surgeon: Prudy Brownie, DO;  Location: [facility];  Service: Pulmonary;;   BRONCHIAL NEEDLE ASPIRATION BIOPSY  12/20/2021   Procedure: BRONCHIAL NEEDLE ASPIRATION BIOPSIES;  Surgeon: Prudy Brownie, DO;  Location: [facility];  Service: Pulmonary;;   CESAREAN SECTION     CHOLECYSTECTOMY     COLONOSCOPY  06/10/2012   RMR: Colonic polyps -removed as described above.    FINE NEEDLE ASPIRATION  12/20/2021   Procedure: FINE NEEDLE ASPIRATION (FNA) LINEAR;  Surgeon: Prudy Brownie, DO;  Location: [facility];  Service: Pulmonary;;   KNEE ARTHROSCOPY  1973   right knee,  torn cart   LUMBAR DISC SURGERY  05/12/2015   L4 L5   LUMBAR LAMINECTOMY/DECOMPRESSION MICRODISCECTOMY Left 05/12/2015   Procedure: Left L4-5 Microdiscectomy;  Surgeon: Adah Acron, MD;  Location: [facility];  Service: Orthopedics;  Laterality: Left;   TONSILLECTOMY     VIDEO BRONCHOSCOPY WITH ENDOBRONCHIAL ULTRASOUND Bilateral 12/20/2021   Procedure: VIDEO BRONCHOSCOPY WITH ENDOBRONCHIAL ULTRASOUND;  Surgeon: Prudy Brownie, DO;  Location: [facility];  Service: Pulmonary;  Laterality: Bilateral;   VIDEO BRONCHOSCOPY WITH RADIAL ENDOBRONCHIAL ULTRASOUND  12/20/2021   Procedure: RADIAL ENDOBRONCHIAL ULTRASOUND;  Surgeon: Prudy Brownie, DO;  Location: [facility];  Service: Pulmonary;;    REVIEW OF SYSTEMS:  Constitutional: positive for fatigue Eyes: negative Ears, nose, mouth, throat, and face: negative Respiratory: positive for cough and dyspnea on exertion Cardiovascular: negative Gastrointestinal: negative Genitourinary:negative Integument/breast: negative Hematologic/lymphatic: negative Musculoskeletal:negative Neurological:  negative Behavioral/Psych: negative Endocrine: negative Allergic/Immunologic: negative     LABORATORY DATA: Lab Results  Component Value Date   WBC 6.5 02/14/2024   HGB 10.7 (L) 02/14/2024   HCT 32.5 (L) 02/14/2024   MCV 93.1 02/14/2024   PLT 278 02/14/2024      Chemistry      Component Value Date/Time   NA 136 02/14/2024 2139   NA 139 12/03/2019 0928   K 3.9 02/14/2024 2139   CL 100 02/14/2024 2139   CO2 24 02/14/2024 2139   BUN 13 02/14/2024 2139   BUN 14 12/03/2019 0928   CREATININE 1.10 (H) 02/14/2024 2139   CREATININE 0.63 02/05/2024 1012   CREATININE 0.64 10/14/2021 0953      Component Value Date/Time   CALCIUM 9.3 02/14/2024 2139   ALKPHOS 73 02/14/2024 2139   AST 35 02/14/2024 2139   AST 32 02/05/2024 1012   ALT 31 02/14/2024 2139   ALT 31 02/05/2024 1012   BILITOT 0.5 02/14/2024 2139   BILITOT 0.3 02/05/2024 1012       RADIOGRAPHIC STUDIES: DG Chest 2 View Result Date: 02/14/2024 CLINICAL DATA:  Chest pain, dyspnea EXAM: CHEST - 2 VIEW COMPARISON:  05/18/2023 FINDINGS: Chronic consolidation within the right mid lung zone appears stable. Consolidation within the right lung base has slightly progressed since prior examination. These findings are better assessed on prior CT examination of 02/05/2024. Bibasilar pulmonary fibrotic changes noted. No pneumothorax or pleural effusion. Cardiac size within limits. No acute bone abnormality. IMPRESSION: 1. Progressive consolidation within the right lung base, possibly infectious or inflammatory in nature. This is better assessed on CT examination of 02/05/2024. Electronically Signed   By: Worthy Heads M.D.   On: 02/14/2024 21:55   CT Chest W Contrast Result Date: 02/13/2024 CLINICAL DATA:  Non-small cell lung cancer, radiation therapy and chemotherapy complete. Right upper chest pain. Shortness of breath. * Tracking Code: BO * EXAM: CT CHEST WITH CONTRAST TECHNIQUE: Multidetector CT imaging of the chest was performed  during intravenous contrast administration. RADIATION DOSE REDUCTION: This exam was performed according to the departmental dose-optimization program which includes automated exposure control, adjustment of the mA and/or kV according to patient size and/or use of iterative reconstruction technique. CONTRAST:  75mL OMNIPAQUE  IOHEXOL  300 MG/ML  SOLN COMPARISON:  09/25/2023 and 07/31/2023. FINDINGS: Cardiovascular: Atherosclerotic calcification of the aorta, aortic valve and coronary arteries. Enlarged right and left pulmonary arteries. Heart is at the upper limits of normal in size to mildly enlarged. No pericardial effusion. Mediastinum/Nodes: No pathologically enlarged mediastinal, hilar or axillary lymph nodes. Esophagus is grossly unremarkable. Lungs/Pleura:  Centrilobular and paraseptal emphysema. Post radiation parenchymal consolidation, traction bronchiectasis and architectural distortion in the perihilar right upper and right lower lobes, stable from 07/31/2023. New collapse/consolidation and/or fluid in the posterolateral inferior right lower lobe. Peripheral and basilar subpleural reticulation and honeycombing. No pleural fluid. Debris in the airway. Upper Abdomen: Cholecystectomy. Small hiatal hernia. Visualized portions of the liver, adrenal glands, kidneys, spleen, pancreas, stomach and bowel are otherwise grossly unremarkable. No upper abdominal adenopathy. Musculoskeletal: Degenerative changes in the spine. No worrisome lytic or sclerotic lesions. IMPRESSION: 1. Post radiation scarring in the right lung, stable from 07/31/2023. 2. New consolidation and/or fluid in the posterolateral inferior right lower lobe, likely infectious/inflammatory in etiology. 3. Basilar subpleural reticulation and honeycombing, indicative of fibrotic interstitial lung disease such as usual interstitial pneumonitis. 4.  Emphysema (ICD10-J43.9). 5. Aortic atherosclerosis (ICD10-I70.0). Coronary artery calcification. 6. Enlarged  right and left pulmonary arteries, indicative of pulmonary arterial hypertension. Electronically Signed   By: Shearon Denis M.D.   On: 02/13/2024 15:38    ASSESSMENT AND PLAN: This is a very pleasant 68  years old white female diagnosed with unresectable stage IIb (T2 a, N1, M0) non-small cell lung cancer, squamous cell carcinoma with right lower lobe lung mass in addition to right hilar lymphadenopathy in March 2023. The patient has PD-L1 expression of 99%. She completed a course of concurrent chemoradiation with weekly carboplatin  for AUC of 2 and paclitaxel  45 Mg/M2 status post 7 cycles.  Last dose was given on 02/27/2022. The patient is currently on observation. She had repeat CT scan of the chest performed 2 weeks ago that showed no concerning findings for disease progression but there was new consolidation and/or fluid in the posterior lateral inferior right lower lobe likely infectious/inflammatory in origin. She also has bilateral subpleural reticulation and honeycombing indicative for fibrotic interstitial lung disease such as usual interstitial pneumonitis. I personally independently reviewed the scan images and discussed the result and showed the images to the patient and her husband.     Unresectable stage 2B non-small cell lung cancer Unresectable stage 2B non-small cell lung cancer, squamous cell carcinoma, diagnosed in March 2023 with PD-L1 expression of 99%. Status post concurrent chemoradiation with weekly carboplatin  and paclitaxel  completed in May 2023. Currently under observation with no evidence of cancer progression on recent imaging. - Schedule follow-up scan in 6 months.  Scarring from radiation therapy Scarring from previous radiation therapy observed on imaging, primarily in the right lung, contributing to respiratory symptoms and chest pain.  Emphysema Significant emphysema noted on imaging, contributing to respiratory symptoms. Presence of dark spaces indicating dead  space that does not contribute to effective breathing.  Interstitial lung disease Interstitial lung disease contributing to respiratory symptoms. Imaging shows scarring and inflammation, particularly in the right lung, affecting breathing.   The patient was advised to call immediately if she has any other concerning symptoms in the interval.  I discussed the assessment and treatment plan with the patient. The patient was provided an opportunity to ask questions and all were answered. The patient agreed with the plan and demonstrated an understanding of the instructions.   The patient was advised to call back or seek an in-person evaluation if the symptoms worsen or if the condition fails to improve as anticipated.  I provided 30 minutes of face-to-face video visit time during this encounter, and > 50% was spent counseling as documented under my assessment & plan.  Aurelio Blower, MD 02/26/2024 8:40 AM  Disclaimer: This note was dictated  with voice recognition software. Similar sounding words can inadvertently be transcribed and may not be corrected upon review.

## 2024-02-27 ENCOUNTER — Ambulatory Visit: Payer: Self-pay | Admitting: Internal Medicine

## 2024-02-27 MED ORDER — PREDNISONE 10 MG PO TABS
ORAL_TABLET | ORAL | 0 refills | Status: DC
Start: 2024-02-27 — End: 2024-03-27

## 2024-02-27 NOTE — Telephone Encounter (Signed)
 So I jsut sent steroids. If pain is svere then needs to go back to ER. CXR 02/14/24 showing some increaedd consolidation.   Is her o2 needs worse?

## 2024-02-27 NOTE — Telephone Encounter (Signed)
     Take prednisone  40 mg daily x 2 weeks  then prednisone  30mg  daily x 2 weeks, then 20mg  daily x 2 weeks then 10mg  daily x 2 weeks, then 5mg  daily x 2 weeeks and then 5mg  On MWF x 2 weeks  and stop   Are you able to send this Gurney Lefort? 150 tablet?

## 2024-02-27 NOTE — Telephone Encounter (Signed)
 E2C2 Pulmonary Triage - Initial Assessment Questions "Chief Complaint (e.g., cough, sob, wheezing, fever, chills, sweat or additional symptoms) *Go to specific symptom protocol after initial questions. Patient calling with continued concerns for right sided chest pain. Patient has a hx of lung cancer and pulmonary fibrosis. Patient was dx with PNA on 02/14/2024 during ED visit. Patient reports that she had started to feel better for a couple of days but had increases in pain that started the four day which have continued. Patient reports pain level of 9 out of 10 to right side of chest into her back.  Patient reports the area is tender to the touch. Patient denies any increase in cough, SOB, wheezing, no mucus, or chills. Patient states pain is worse in the morning. Reports being started on hydrocodone  by PCP on 02/14/2024-patient only started using in the last couple of days. Patient is requesting recommendations from pulmonary-concerned if she needs another CT scan. Please follow up with patient.   "How long have symptoms been present?" Patient reports symptoms started before she was seen in the ED on 5/1  Have you tested for COVID or Flu? Note: If not, ask patient if a home test can be taken. If so, instruct patient to call back for positive results. No  MEDICINES:   "Have you used any OTC meds to help with symptoms?" Yes If yes, ask "What medications?" Tylenol   "Have you used your inhalers/maintenance medication?" Yes If yes, "What medications?" Breztri   If inhaler, ask "How many puffs and how often?" Note: Review instructions on medication in the chart. Breztri  2 puffs BID  OXYGEN : "Do you wear supplemental oxygen ?" Yes If yes, "How many liters are you supposed to use?" 4L at rest 10L with activity  "Do you monitor your oxygen  levels?" Yes If yes, "What is your reading (oxygen  level) today?" 96%  "What is your usual oxygen  saturation reading?"  (Note: Pulmonary O2 sats should be  90% or greater) 96%  Copied from CRM [id number]. Topic: Clinical - Red Word Triage >> Feb 27, 2024  9:16 AM Diane [name] wrote: Red Word that prompted transfer to Nurse Triage: Pt has been on antibiotics for about 7 days and has severe chest pain. Pt stated she's already went to the ER and her PCP on 02/14/2024. Pt stated she's been having severe chest pain for about two weeks and may need a new CT. Reason for Disposition  [1] Completed antibiotic treatment AND [2] cough not improved  Answer Assessment - Initial Assessment Questions 1. SYMPTOM: "What's the main symptom you're concerned about?" (e.g., breathing difficulty, fever, weakness)     Right sided pain to chest and back 2. ONSET: "When did the  right sided pain to chest and back  start?"     Was seen in the ED on 5/1 and dx with PNA-patient states pain had been going on for over a week prior. 3. BETTER-SAME-WORSE: "Are you getting better, staying the same, or getting worse compared to the day you were discharged?"     Thought she was getting better but pain worsened. 4. BREATHING DIFFICULTY: "Are you having any difficulty breathing?" If Yes, ask: "How bad is it?"  (e.g., none, mild, moderate, severe)   - MILD: No SOB at rest, mild SOB with walking, speaks normally in sentences, can lie down, no retractions, pulse < 100.    - MODERATE: SOB at rest, SOB with minimal exertion and prefers to sit, cannot lie down flat, speaks in phrases, mild retractions, audible wheezing,  pulse 100-120.    - SEVERE: Very SOB at rest, speaks in single words, struggling to breathe, sitting hunched forward, retractions, pulse > 120      Patient reports have no increase in SOB 5. FEVER: "Do you have a fever?" If Yes, ask: "What is your temperature, how was it measured, and when did it start?"     no 6. SPUTUM: "Describe the color of your sputum" (clear, white, yellow, green, blood-tinged)     No sputum 7. DIAGNOSIS CONFIRMATION: "When was the pneumonia diagnosed?" "By  whom?"     ER on 5/1 8. ANTIBIOTIC: "Are you taking an antibiotic?"  If Yes, ask: "Which one?" "When was it started?"     Levaquin  on 5/1 9. OTHER TREATMENT: "Are you receiving any other treatment for the pneumonia?" (e.g., albuterol  nebulizer, oxygen ) If Yes, ask: "How often?" and "Does it help?"     Prednisone  10. HOSPITAL ADMISSION: "Were you hospitalized for this pneumonia?" If Yes, ask: "When were you discharged home from the hospital?"       no 11. O2 SATURATION MONITOR:  "Do you use an oxygen  saturation monitor (pulse oximeter) at home?" If Yes, "What is your reading (oxygen  level) today?" "What is your usual oxygen  saturation reading?" (e.g., 95%)       96% on 4L  Protocols used: Pneumonia Follow-up Call-A-AH

## 2024-02-27 NOTE — Telephone Encounter (Signed)
MR please advise 

## 2024-02-27 NOTE — Telephone Encounter (Signed)
 This msg is regarding o2 form to be signed  Will you please verify what you are needing for her?

## 2024-02-27 NOTE — Telephone Encounter (Signed)
Rx sent. Pt aware.  

## 2024-02-27 NOTE — Telephone Encounter (Signed)
 Probably worth tryin 8-12 week pred taper. If she Is onboard I can write it out

## 2024-02-27 NOTE — Telephone Encounter (Signed)
 Spoke with the pt  She does want the pred taper  Her pharmacy is [facility]- on file  Please send rx, thank you!

## 2024-02-28 ENCOUNTER — Other Ambulatory Visit: Payer: Self-pay | Admitting: Internal Medicine

## 2024-02-28 ENCOUNTER — Telehealth (HOSPITAL_COMMUNITY): Payer: Self-pay

## 2024-02-28 DIAGNOSIS — J9611 Chronic respiratory failure with hypoxia: Secondary | ICD-10-CM

## 2024-02-28 DIAGNOSIS — R0781 Pleurodynia: Secondary | ICD-10-CM

## 2024-02-28 DIAGNOSIS — R079 Chest pain, unspecified: Secondary | ICD-10-CM

## 2024-02-28 DIAGNOSIS — J84112 Idiopathic pulmonary fibrosis: Secondary | ICD-10-CM

## 2024-02-28 DIAGNOSIS — J439 Emphysema, unspecified: Secondary | ICD-10-CM

## 2024-02-28 NOTE — Telephone Encounter (Signed)
**Note De-identified  [place] Obfuscation** Please advise 

## 2024-02-28 NOTE — Telephone Encounter (Signed)
 Pt called and stated she would like to hold off on participating in PR at this time due to medication change. She will contact PR at a later time when she "feels like she is ready to do the program".   Closed referral

## 2024-02-28 NOTE — Telephone Encounter (Signed)
 I did not see anything to sign on her

## 2024-02-29 ENCOUNTER — Ambulatory Visit (HOSPITAL_COMMUNITY)

## 2024-03-04 ENCOUNTER — Telehealth: Payer: Self-pay | Admitting: Internal Medicine

## 2024-03-04 ENCOUNTER — Ambulatory Visit (HOSPITAL_COMMUNITY)

## 2024-03-04 ENCOUNTER — Ambulatory Visit (HOSPITAL_COMMUNITY)
Admission: RE | Admit: 2024-03-04 | Discharge: 2024-03-04 | Disposition: A | Discharge status: Home / Self Care | Source: Ambulatory Visit | Attending: Internal Medicine | Admitting: Internal Medicine

## 2024-03-04 DIAGNOSIS — R0781 Pleurodynia: Secondary | ICD-10-CM | POA: Diagnosis present

## 2024-03-04 DIAGNOSIS — D649 Anemia, unspecified: Secondary | ICD-10-CM

## 2024-03-04 DIAGNOSIS — R079 Chest pain, unspecified: Secondary | ICD-10-CM

## 2024-03-04 DIAGNOSIS — R7989 Other specified abnormal findings of blood chemistry: Secondary | ICD-10-CM

## 2024-03-04 NOTE — Telephone Encounter (Signed)
 Rc'd Fax for O2 from [facility]. Was signed by Dr. Ardeth Beckers. Will fax to [phone] today and wait for fax confirmation.

## 2024-03-04 NOTE — Telephone Encounter (Signed)
 Ok sometihig else is going on  Plan  - CT chest wo contrast  - urgent this week  . Sharlene Dawn ORDERD

## 2024-03-05 ENCOUNTER — Other Ambulatory Visit (HOSPITAL_COMMUNITY)
Admission: RE | Admit: 2024-03-05 | Discharge: 2024-03-05 | Disposition: A | Discharge status: Home / Self Care | Source: Ambulatory Visit | Attending: Internal Medicine | Admitting: Internal Medicine

## 2024-03-05 DIAGNOSIS — R079 Chest pain, unspecified: Secondary | ICD-10-CM | POA: Diagnosis present

## 2024-03-05 DIAGNOSIS — D649 Anemia, unspecified: Secondary | ICD-10-CM | POA: Insufficient documentation

## 2024-03-05 DIAGNOSIS — R7989 Other specified abnormal findings of blood chemistry: Secondary | ICD-10-CM | POA: Diagnosis present

## 2024-03-05 LAB — COMPREHENSIVE METABOLIC PANEL WITH GFR
ALT: 25 U/L (ref 0–44)
AST: 25 U/L (ref 15–41)
Albumin: 3.6 g/dL (ref 3.5–5.0)
Alkaline Phosphatase: 67 U/L (ref 38–126)
Anion gap: 7 (ref 5–15)
BUN: 20 mg/dL (ref 8–23)
CO2: 24 mmol/L (ref 22–32)
Calcium: 9.4 mg/dL (ref 8.9–10.3)
Chloride: 102 mmol/L (ref 98–111)
Creatinine, Ser: 0.65 mg/dL (ref 0.44–1.00)
GFR, Estimated: >60 mL/min (ref >60)
Glucose, Bld: 118 mg/dL — ABNORMAL HIGH (ref 70–99)
Potassium: 4 mmol/L (ref 3.5–5.1)
Sodium: 133 mmol/L — ABNORMAL LOW (ref 135–145)
Total Bilirubin: 0.5 mg/dL (ref 0.0–1.2)
Total Protein: 7.5 g/dL (ref 6.5–8.1)

## 2024-03-05 LAB — CK TOTAL AND CKMB (NOT AT ARMC)
CK, MB: 1.1 ng/mL (ref 0.5–5.0)
Total CK: 33 U/L — ABNORMAL LOW (ref 38–234)

## 2024-03-05 LAB — CBC WITH DIFFERENTIAL/PLATELET
Abs Immature Granulocytes: 0.11 10*3/uL — ABNORMAL HIGH (ref 0.00–0.07)
Basophils Absolute: 0 10*3/uL (ref 0.0–0.1)
Basophils Relative: 0 %
Eosinophils Absolute: 0 10*3/uL (ref 0.0–0.5)
Eosinophils Relative: 0 %
HCT: 32.5 % — ABNORMAL LOW (ref 36.0–46.0)
Hemoglobin: 10.8 g/dL — ABNORMAL LOW (ref 12.0–15.0)
Immature Granulocytes: 1 %
Lymphocytes Relative: 6 %
Lymphs Abs: 0.7 10*3/uL (ref 0.7–4.0)
MCH: 31.8 pg (ref 26.0–34.0)
MCHC: 33.2 g/dL (ref 30.0–36.0)
MCV: 95.6 fL (ref 80.0–100.0)
Monocytes Absolute: 0.2 10*3/uL (ref 0.1–1.0)
Monocytes Relative: 2 %
Neutro Abs: 10.2 10*3/uL — ABNORMAL HIGH (ref 1.7–7.7)
Neutrophils Relative %: 91 %
Platelets: 296 10*3/uL (ref 150–400)
RBC: 3.4 MIL/uL — ABNORMAL LOW (ref 3.87–5.11)
RDW: 13.6 % (ref 11.5–15.5)
WBC: 11.2 10*3/uL — ABNORMAL HIGH (ref 4.0–10.5)
nRBC: 0 % (ref 0.0–0.2)

## 2024-03-05 LAB — SEDIMENTATION RATE: Sed Rate: 35 mm/h — ABNORMAL HIGH (ref 0–22)

## 2024-03-05 NOTE — Telephone Encounter (Signed)
I called and spoke with the pt and notified of results/recs per MR   She verbalized understanding  Nothing further needed

## 2024-03-05 NOTE — Telephone Encounter (Signed)
   CT is NOT explaning cuase of chest pain. Review of labs indicate that on 02/14/24 creatining went up and anemia some worse this needs to be followed up. I am ordering repeat BMET. But this wont explain pain either but requires followup. Her trop and d-dimer were normal  Plan  - check cbb, bmet and ESR repeat, CK - continue long pred regimern already started - monitor for shingles - if pain worse call back - go to er ER or talk to PCP .etc   CT Chest Wo Contrast Result Date: 03/04/2024 CLINICAL DATA:  Worsening chest pain and shortness of breath. EXAM: CT CHEST WITHOUT CONTRAST TECHNIQUE: Multidetector CT imaging of the chest was performed following the standard protocol without IV contrast. RADIATION DOSE REDUCTION: This exam was performed according to the departmental dose-optimization program which includes automated exposure control, adjustment of the mA and/or kV according to patient size and/or use of iterative reconstruction technique. COMPARISON:  Chest CT dated 02/05/2024. FINDINGS: Evaluation of this exam is limited in the absence of intravenous contrast. Cardiovascular: There is no cardiomegaly or pericardial effusion. There is coronary vascular calcification. Mild atherosclerotic calcification of the thoracic aorta. No aneurysmal dilatation. Mildly dilated main pulmonary trunk suggestive of pulmonary hypertension. Mediastinum/Nodes: No obvious hilar or mediastinal adenopathy. Evaluation however is limited in the absence of contrast and due to consolidative changes of the lungs. The esophagus is grossly unremarkable. No mediastinal fluid collection. Lungs/Pleura: Background of emphysema. Similar appearance of post radiation scarring in the right lung. An area of subpleural density in the posterolateral right lung base similar to prior CT. No new consolidation. No pleural effusion or pneumothorax. The central airways are patent. Upper Abdomen: No acute abnormality. Musculoskeletal: Osteopenia.   No acute osseous pathology. IMPRESSION: 1. No acute intrathoracic pathology. 2. Similar appearance of post radiation scarring in the right lung. 3. Aortic Atherosclerosis (ICD10-I70.0) and Emphysema (ICD10-J43.9). Electronically Signed   By: Angus Bark M.D.   On: 03/04/2024 12:46

## 2024-03-06 ENCOUNTER — Encounter: Payer: Self-pay | Admitting: Internal Medicine

## 2024-03-06 ENCOUNTER — Telehealth: Payer: Self-pay | Admitting: *Deleted

## 2024-03-06 ENCOUNTER — Ambulatory Visit (HOSPITAL_COMMUNITY)

## 2024-03-06 NOTE — Telephone Encounter (Signed)
 Spoke to patient.  She is currently in her primary care physician's office to be evaluated for the pain.  I did tell her the reason for palliative care referral is for long-term management of severe pain and a cancer patient.  The palliative care clinic is Co. located in the cancer clinic.  But I did indicate to her she needs both acute and long-term management.  Did indicate to her that currently pain etiology is uncertain for me.  Did indicate that I am happy to talk to the primary care physician after the primary care physician evaluates the patient.

## 2024-03-06 NOTE — Telephone Encounter (Signed)
 Spoke with the pt and notified of response from MR  She stats that she only takes hycodan at night and it helps her sleep  She is unsure if would help with pain in the day time  She is asking MR to call her or email her to discuss why she needs to see oncology  She wants to know if he thinks her pain is being caused by her cancer  Please call her or respond directly to her mychart msg  I went ahead and placed the palliative care referral

## 2024-03-06 NOTE — Telephone Encounter (Signed)
 Basic blood work is okay.  Anemia is also stable. CK is not elevaed.  Unclear cause of pain esp sudden deterioration   Plan  - refer pallaitived care at the cancer center - does the hycodan I give for cough help pain -> might have to give opiopids  - also she needs eval in an urgent care or our office or by primary or oncology sometime next 1 week - on site OR even back to eR     LABS    PULMONARY No results for input(s): "PHART", "PCO2ART", "PO2ART", "HCO3", "TCO2", "O2SAT" in the last 168 hours.  Invalid input(s): "PCO2", "PO2"  CBC Recent Labs  Lab 03/05/24 1253  HGB 10.8*  HCT 32.5*  WBC 11.2*  PLT 296    COAGULATION No results for input(s): "INR" in the last 168 hours.  CARDIAC  No results for input(s): "TROPONINI" in the last 168 hours. No results for input(s): "PROBNP" in the last 168 hours.   CHEMISTRY Recent Labs  Lab 03/05/24 1253  NA 133*  K 4.0  CL 102  CO2 24  GLUCOSE 118*  BUN 20  CREATININE 0.65  CALCIUM 9.4   Estimated Creatinine Clearance: 62.2 mL/min (by C-G formula based on SCr of 0.65 mg/dL).   LIVER Recent Labs  Lab 03/05/24 1253  AST 25  ALT 25  ALKPHOS 67  BILITOT 0.5  PROT 7.5  ALBUMIN 3.6     INFECTIOUS No results for input(s): "LATICACIDVEN", "PROCALCITON" in the last 168 hours.   ENDOCRINE CBG (last 3)  No results for input(s): "GLUCAP" in the last 72 hours.       IMAGING x48h  - image(s) personally visualized  -   highlighted in bold CT Chest Wo Contrast Result Date: 03/04/2024 CLINICAL DATA:  Worsening chest pain and shortness of breath. EXAM: CT CHEST WITHOUT CONTRAST TECHNIQUE: Multidetector CT imaging of the chest was performed following the standard protocol without IV contrast. RADIATION DOSE REDUCTION: This exam was performed according to the departmental dose-optimization program which includes automated exposure control, adjustment of the mA and/or kV according to patient size and/or use of  iterative reconstruction technique. COMPARISON:  Chest CT dated 02/05/2024. FINDINGS: Evaluation of this exam is limited in the absence of intravenous contrast. Cardiovascular: There is no cardiomegaly or pericardial effusion. There is coronary vascular calcification. Mild atherosclerotic calcification of the thoracic aorta. No aneurysmal dilatation. Mildly dilated main pulmonary trunk suggestive of pulmonary hypertension. Mediastinum/Nodes: No obvious hilar or mediastinal adenopathy. Evaluation however is limited in the absence of contrast and due to consolidative changes of the lungs. The esophagus is grossly unremarkable. No mediastinal fluid collection. Lungs/Pleura: Background of emphysema. Similar appearance of post radiation scarring in the right lung. An area of subpleural density in the posterolateral right lung base similar to prior CT. No new consolidation. No pleural effusion or pneumothorax. The central airways are patent. Upper Abdomen: No acute abnormality. Musculoskeletal: Osteopenia.  No acute osseous pathology. IMPRESSION: 1. No acute intrathoracic pathology. 2. Similar appearance of post radiation scarring in the right lung. 3. Aortic Atherosclerosis (ICD10-I70.0) and Emphysema (ICD10-J43.9). Electronically Signed   By: Angus Bark M.D.   On: 03/04/2024 12:46

## 2024-03-06 NOTE — Telephone Encounter (Signed)
 Copied from CRM [id number]. Topic: Clinical - Lab/Test Results >> Mar 06, 2024 12:28 PM Tyronne Galloway wrote: Reason for CRM: Pt stated she has been speaking with Gurney Lefort the last few days regarding her concerns with her CT and the CT results plus her chest pain. Pt had labs done yesterday, and wanted to know what else she can do to assist with this going forward. Please call the pt back with the direction of care at [phone] ok to leave a vm.

## 2024-03-07 NOTE — Telephone Encounter (Signed)
 See mychart msg 02/26/24- I spoke with the pt yesterday and already have routed MR encounter so he can respond to her.

## 2024-03-10 ENCOUNTER — Encounter: Payer: Self-pay | Admitting: Internal Medicine

## 2024-03-11 ENCOUNTER — Ambulatory Visit (HOSPITAL_COMMUNITY)

## 2024-03-11 NOTE — Telephone Encounter (Signed)
 Cough med refill

## 2024-03-12 ENCOUNTER — Encounter: Payer: Self-pay | Admitting: Internal Medicine

## 2024-03-13 ENCOUNTER — Ambulatory Visit (HOSPITAL_COMMUNITY)

## 2024-03-18 ENCOUNTER — Other Ambulatory Visit: Payer: Self-pay

## 2024-03-18 ENCOUNTER — Ambulatory Visit (HOSPITAL_COMMUNITY)

## 2024-03-18 DIAGNOSIS — R053 Chronic cough: Secondary | ICD-10-CM

## 2024-03-19 MED ORDER — HYDROCODONE BIT-HOMATROP MBR 5-1.5 MG/5ML PO SOLN: 5.0000 mL | Freq: Four times a day (QID) | ORAL | 0 refills | Status: DC | PRN

## 2024-03-19 NOTE — Telephone Encounter (Signed)
 Let her know competed

## 2024-03-19 NOTE — Telephone Encounter (Signed)
 Notified via [employer]

## 2024-03-20 ENCOUNTER — Ambulatory Visit (HOSPITAL_COMMUNITY)

## 2024-03-25 ENCOUNTER — Ambulatory Visit (HOSPITAL_COMMUNITY)

## 2024-03-27 ENCOUNTER — Encounter (HOSPITAL_COMMUNITY): Payer: Self-pay | Admitting: Emergency Medicine

## 2024-03-27 ENCOUNTER — Telehealth (HOSPITAL_COMMUNITY): Payer: Self-pay

## 2024-03-27 ENCOUNTER — Emergency Department (HOSPITAL_COMMUNITY)

## 2024-03-27 ENCOUNTER — Ambulatory Visit (HOSPITAL_COMMUNITY)

## 2024-03-27 ENCOUNTER — Ambulatory Visit: Payer: Self-pay | Admitting: Internal Medicine

## 2024-03-27 ENCOUNTER — Other Ambulatory Visit: Payer: Self-pay

## 2024-03-27 ENCOUNTER — Inpatient Hospital Stay (HOSPITAL_COMMUNITY)
Admission: EM | Admit: 2024-03-27 | Discharge: 2024-04-01 | DRG: 196 | Disposition: A | Discharge status: Hospice | Attending: Family Medicine | Admitting: Family Medicine

## 2024-03-27 DIAGNOSIS — E7800 Pure hypercholesterolemia, unspecified: Secondary | ICD-10-CM | POA: Diagnosis present

## 2024-03-27 DIAGNOSIS — Z818 Family history of other mental and behavioral disorders: Secondary | ICD-10-CM

## 2024-03-27 DIAGNOSIS — Z821 Family history of blindness and visual loss: Secondary | ICD-10-CM

## 2024-03-27 DIAGNOSIS — J439 Emphysema, unspecified: Secondary | ICD-10-CM | POA: Diagnosis present

## 2024-03-27 DIAGNOSIS — J9601 Acute respiratory failure with hypoxia: Secondary | ICD-10-CM

## 2024-03-27 DIAGNOSIS — Z832 Family history of diseases of the blood and blood-forming organs and certain disorders involving the immune mechanism: Secondary | ICD-10-CM

## 2024-03-27 DIAGNOSIS — J9611 Chronic respiratory failure with hypoxia: Secondary | ICD-10-CM | POA: Diagnosis not present

## 2024-03-27 DIAGNOSIS — C3491 Malignant neoplasm of unspecified part of right bronchus or lung: Secondary | ICD-10-CM | POA: Diagnosis present

## 2024-03-27 DIAGNOSIS — Z880 Allergy status to penicillin: Secondary | ICD-10-CM

## 2024-03-27 DIAGNOSIS — D630 Anemia in neoplastic disease: Secondary | ICD-10-CM | POA: Diagnosis present

## 2024-03-27 DIAGNOSIS — J84112 Idiopathic pulmonary fibrosis: Principal | ICD-10-CM | POA: Diagnosis present

## 2024-03-27 DIAGNOSIS — Z66 Do not resuscitate: Secondary | ICD-10-CM | POA: Diagnosis present

## 2024-03-27 DIAGNOSIS — I10 Essential (primary) hypertension: Secondary | ICD-10-CM | POA: Diagnosis present

## 2024-03-27 DIAGNOSIS — Z8261 Family history of arthritis: Secondary | ICD-10-CM

## 2024-03-27 DIAGNOSIS — Z9221 Personal history of antineoplastic chemotherapy: Secondary | ICD-10-CM

## 2024-03-27 DIAGNOSIS — Z79899 Other long term (current) drug therapy: Secondary | ICD-10-CM

## 2024-03-27 DIAGNOSIS — Z809 Family history of malignant neoplasm, unspecified: Secondary | ICD-10-CM

## 2024-03-27 DIAGNOSIS — Z1152 Encounter for screening for COVID-19: Secondary | ICD-10-CM

## 2024-03-27 DIAGNOSIS — Z860100 Personal history of colon polyps, unspecified: Secondary | ICD-10-CM

## 2024-03-27 DIAGNOSIS — Z83438 Family history of other disorder of lipoprotein metabolism and other lipidemia: Secondary | ICD-10-CM

## 2024-03-27 DIAGNOSIS — Z9981 Dependence on supplemental oxygen: Secondary | ICD-10-CM

## 2024-03-27 DIAGNOSIS — Z91014 Allergy to mammalian meats: Secondary | ICD-10-CM

## 2024-03-27 DIAGNOSIS — R053 Chronic cough: Secondary | ICD-10-CM

## 2024-03-27 DIAGNOSIS — J8410 Pulmonary fibrosis, unspecified: Secondary | ICD-10-CM

## 2024-03-27 DIAGNOSIS — F411 Generalized anxiety disorder: Secondary | ICD-10-CM | POA: Diagnosis not present

## 2024-03-27 DIAGNOSIS — F32A Depression, unspecified: Secondary | ICD-10-CM | POA: Diagnosis present

## 2024-03-27 DIAGNOSIS — Z811 Family history of alcohol abuse and dependence: Secondary | ICD-10-CM

## 2024-03-27 DIAGNOSIS — Z8249 Family history of ischemic heart disease and other diseases of the circulatory system: Secondary | ICD-10-CM

## 2024-03-27 DIAGNOSIS — Z515 Encounter for palliative care: Secondary | ICD-10-CM

## 2024-03-27 DIAGNOSIS — Z825 Family history of asthma and other chronic lower respiratory diseases: Secondary | ICD-10-CM

## 2024-03-27 DIAGNOSIS — J441 Chronic obstructive pulmonary disease with (acute) exacerbation: Principal | ICD-10-CM | POA: Diagnosis present

## 2024-03-27 DIAGNOSIS — J9621 Acute and chronic respiratory failure with hypoxia: Secondary | ICD-10-CM | POA: Diagnosis present

## 2024-03-27 DIAGNOSIS — Z923 Personal history of irradiation: Secondary | ICD-10-CM

## 2024-03-27 DIAGNOSIS — G629 Polyneuropathy, unspecified: Secondary | ICD-10-CM | POA: Diagnosis present

## 2024-03-27 LAB — COMPREHENSIVE METABOLIC PANEL WITH GFR
ALT: 57 U/L — ABNORMAL HIGH (ref 0–44)
AST: 40 U/L (ref 15–41)
Albumin: 3.4 g/dL — ABNORMAL LOW (ref 3.5–5.0)
Alkaline Phosphatase: 109 U/L (ref 38–126)
Anion gap: 10 (ref 5–15)
BUN: 15 mg/dL (ref 8–23)
CO2: 23 mmol/L (ref 22–32)
Calcium: 9.3 mg/dL (ref 8.9–10.3)
Chloride: 102 mmol/L (ref 98–111)
Creatinine, Ser: 0.57 mg/dL (ref 0.44–1.00)
GFR, Estimated: >60 mL/min (ref >60)
Glucose, Bld: 118 mg/dL — ABNORMAL HIGH (ref 70–99)
Potassium: 3.9 mmol/L (ref 3.5–5.1)
Sodium: 135 mmol/L (ref 135–145)
Total Bilirubin: 0.4 mg/dL (ref 0.0–1.2)
Total Protein: 8 g/dL (ref 6.5–8.1)

## 2024-03-27 LAB — CBC WITH DIFFERENTIAL/PLATELET
Abs Immature Granulocytes: 0.04 10*3/uL (ref 0.00–0.07)
Basophils Absolute: 0 10*3/uL (ref 0.0–0.1)
Basophils Relative: 0 %
Eosinophils Absolute: 0.2 10*3/uL (ref 0.0–0.5)
Eosinophils Relative: 4 %
HCT: 31.2 % — ABNORMAL LOW (ref 36.0–46.0)
Hemoglobin: 10 g/dL — ABNORMAL LOW (ref 12.0–15.0)
Immature Granulocytes: 1 %
Lymphocytes Relative: 15 %
Lymphs Abs: 0.9 10*3/uL (ref 0.7–4.0)
MCH: 31 pg (ref 26.0–34.0)
MCHC: 32.1 g/dL (ref 30.0–36.0)
MCV: 96.6 fL (ref 80.0–100.0)
Monocytes Absolute: 0.5 10*3/uL (ref 0.1–1.0)
Monocytes Relative: 9 %
Neutro Abs: 4.5 10*3/uL (ref 1.7–7.7)
Neutrophils Relative %: 71 %
Platelets: 286 10*3/uL (ref 150–400)
RBC: 3.23 MIL/uL — ABNORMAL LOW (ref 3.87–5.11)
RDW: 13.5 % (ref 11.5–15.5)
WBC: 6.2 10*3/uL (ref 4.0–10.5)
nRBC: 0 % (ref 0.0–0.2)

## 2024-03-27 LAB — URINALYSIS, ROUTINE W REFLEX MICROSCOPIC
Bacteria, UA: NONE SEEN
Bilirubin Urine: NEGATIVE
Glucose, UA: NEGATIVE mg/dL
Hgb urine dipstick: NEGATIVE
Ketones, ur: NEGATIVE mg/dL
Nitrite: NEGATIVE
Protein, ur: NEGATIVE mg/dL
Specific Gravity, Urine: 1.019 (ref 1.005–1.030)
pH: 5 (ref 5.0–8.0)

## 2024-03-27 LAB — BRAIN NATRIURETIC PEPTIDE: B Natriuretic Peptide: 20 pg/mL (ref 0.0–100.0)

## 2024-03-27 LAB — RESP PANEL BY RT-PCR (RSV, FLU A&B, COVID)  RVPGX2
Influenza A by PCR: NEGATIVE
Influenza B by PCR: NEGATIVE
Resp Syncytial Virus by PCR: NEGATIVE
SARS Coronavirus 2 by RT PCR: NEGATIVE

## 2024-03-27 LAB — TROPONIN I (HIGH SENSITIVITY): Troponin I (High Sensitivity): 3 ng/L (ref <18)

## 2024-03-27 LAB — LACTIC ACID, PLASMA: Lactic Acid, Venous: 1.4 mmol/L (ref 0.5–1.9)

## 2024-03-27 MED ORDER — DULOXETINE HCL 30 MG PO CPEP
30.0000 mg | ORAL_CAPSULE | Freq: Every day | ORAL | Status: DC
  Administered 2024-03-28 – 2024-04-01 (×5): 30 mg via ORAL
  Filled 2024-03-27 (×5): qty 1

## 2024-03-27 MED ORDER — GABAPENTIN 100 MG PO CAPS
200.0000 mg | ORAL_CAPSULE | Freq: Three times a day (TID) | ORAL | Status: DC
  Administered 2024-03-27 – 2024-04-01 (×15): 200 mg via ORAL
  Filled 2024-03-27 (×15): qty 2

## 2024-03-27 MED ORDER — IPRATROPIUM-ALBUTEROL 0.5-2.5 (3) MG/3ML IN SOLN
3.0000 mL | Freq: Once | RESPIRATORY_TRACT | Status: AC
  Administered 2024-03-27: 3 mL via RESPIRATORY_TRACT
  Filled 2024-03-27: qty 3

## 2024-03-27 MED ORDER — LORATADINE 10 MG PO TABS
10.0000 mg | ORAL_TABLET | Freq: Every day | ORAL | Status: DC
  Administered 2024-03-27 – 2024-04-01 (×6): 10 mg via ORAL
  Filled 2024-03-27 (×6): qty 1

## 2024-03-27 MED ORDER — ACETAMINOPHEN 650 MG RE SUPP: 650.0000 mg | Freq: Four times a day (QID) | RECTAL | Status: DC | PRN

## 2024-03-27 MED ORDER — PIRFENIDONE 267 MG PO TABS
801.0000 mg | ORAL_TABLET | Freq: Three times a day (TID) | ORAL | Status: DC
  Administered 2024-03-28: 801 mg via ORAL
  Administered 2024-03-29: 534 mg via ORAL
  Filled 2024-03-27 (×3): qty 3

## 2024-03-27 MED ORDER — METHYLPREDNISOLONE SODIUM SUCC 125 MG IJ SOLR
125.0000 mg | Freq: Once | INTRAMUSCULAR | Status: AC
  Administered 2024-03-27: 125 mg via INTRAVENOUS
  Filled 2024-03-27: qty 2

## 2024-03-27 MED ORDER — IOHEXOL 350 MG/ML SOLN
75.0000 mL | Freq: Once | INTRAVENOUS | Status: AC | PRN
  Administered 2024-03-27: 75 mL via INTRAVENOUS

## 2024-03-27 MED ORDER — PREDNISONE 20 MG PO TABS: 40.0000 mg | ORAL_TABLET | Freq: Every day | ORAL | Status: DC

## 2024-03-27 MED ORDER — PANTOPRAZOLE SODIUM 40 MG PO TBEC
40.0000 mg | DELAYED_RELEASE_TABLET | Freq: Every day | ORAL | Status: DC
  Administered 2024-03-27 – 2024-04-01 (×6): 40 mg via ORAL
  Filled 2024-03-27 (×6): qty 1

## 2024-03-27 MED ORDER — LACTATED RINGERS IV BOLUS
500.0000 mL | Freq: Once | INTRAVENOUS | Status: AC
  Administered 2024-03-27: 500 mL via INTRAVENOUS

## 2024-03-27 MED ORDER — ONDANSETRON HCL 4 MG PO TABS: 4.0000 mg | ORAL_TABLET | Freq: Four times a day (QID) | ORAL | Status: DC | PRN

## 2024-03-27 MED ORDER — POLYETHYLENE GLYCOL 3350 17 G PO PACK: 17.0000 g | PACK | Freq: Every day | ORAL | Status: DC | PRN

## 2024-03-27 MED ORDER — IPRATROPIUM-ALBUTEROL 0.5-2.5 (3) MG/3ML IN SOLN: 3.0000 mL | RESPIRATORY_TRACT | Status: DC | PRN

## 2024-03-27 MED ORDER — HYDROCODONE BIT-HOMATROP MBR 5-1.5 MG/5ML PO SOLN
5.0000 mL | Freq: Every evening | ORAL | Status: DC | PRN
  Administered 2024-03-27 – 2024-03-31 (×4): 5 mL via ORAL
  Filled 2024-03-27 (×4): qty 5

## 2024-03-27 MED ORDER — ONDANSETRON HCL 4 MG/2ML IJ SOLN: 4.0000 mg | Freq: Four times a day (QID) | INTRAMUSCULAR | Status: DC | PRN

## 2024-03-27 MED ORDER — ROSUVASTATIN CALCIUM 10 MG PO TABS
10.0000 mg | ORAL_TABLET | Freq: Every day | ORAL | Status: DC
  Administered 2024-03-27 – 2024-03-31 (×5): 10 mg via ORAL
  Filled 2024-03-27 (×5): qty 1

## 2024-03-27 MED ORDER — RIVAROXABAN 10 MG PO TABS
10.0000 mg | ORAL_TABLET | Freq: Every day | ORAL | Status: DC
  Administered 2024-03-29 – 2024-03-31 (×3): 10 mg via ORAL
  Filled 2024-03-27 (×4): qty 1

## 2024-03-27 MED ORDER — METHYLPREDNISOLONE SODIUM SUCC 125 MG IJ SOLR
60.0000 mg | Freq: Two times a day (BID) | INTRAMUSCULAR | Status: DC
  Administered 2024-03-28: 60 mg via INTRAVENOUS
  Filled 2024-03-27: qty 2

## 2024-03-27 MED ORDER — ACETAMINOPHEN 325 MG PO TABS
650.0000 mg | ORAL_TABLET | Freq: Four times a day (QID) | ORAL | Status: DC | PRN
  Administered 2024-03-27 – 2024-03-29 (×6): 650 mg via ORAL
  Filled 2024-03-27 (×6): qty 2

## 2024-03-27 NOTE — ED Notes (Signed)
 Hospitalist at bedside

## 2024-03-27 NOTE — Assessment & Plan Note (Signed)
 Resume Cymbalta.   And gabapentin for nerve pain.

## 2024-03-27 NOTE — Telephone Encounter (Signed)
 RN was going to call pt to f/u about Pulm Rehab program. Pt is currently in ED, will continue to follow

## 2024-03-27 NOTE — Telephone Encounter (Signed)
 I believe I waned her to see the palliative care clinic at the cancer center - they typically address pain, cough, dyspnea and symptoms

## 2024-03-27 NOTE — H&P (Addendum)
 History and Physical    Diane [name] NIO:[id number] DOB: 1956/03/18 DOA: 03/27/2024  PCP: [name], MD  Patient coming from: Home  I have personally briefly reviewed patient's old medical records in [facility]  Chief Complaint: Difficulty breathing  HPI: Diane [name] is a 68 y.o. female with medical history significant for idiopathic pulmonary fibrosis, COPD, anxiety, lung cancer. Patient presented to the ED with complaints of difficulty breathing that started yesterday.  Symptoms are present only with exertion and resolves with rest.  She reports with exertion her O2 sats dropped down to the 50s while she was on her home 4 L of O2.  No chest pain.  She has a mild chronic cough that is unchanged for which she takes Hycodan at bedtime time. She has been on 2 courses of steroids recently within the past 2 months-first course was for nerve pain involving her right lower flank secondary to radiation, and the second was for lung findings of nodular densities/scarring to her lungs.  She stopped her steroids ~2 days ago-because she felt the dose was too high, she was eating a lot, gaining weight and not sleeping at night.   She is not on chronic steroids. She follows with Dr. Bertrum Brodie and is on pirfenidone .  ED Course: O2 sats greater than 95% on home 4 L.  Temperature 97.6.  Heart rate 87-102.  Respirate rate 14-24.  Blood pressure systolic 97-115. Chest x-ray negative for acute abnormality. Subsequent CTA chest-negative for PE,Chronic post radiation changes and scarring in the right lung. A 2.2 x 1.2 cm nodular density in the right upper lobe appears slightly more rounded since the prior CT. Although this may represent a nodular scarring, recurrent disease is not excluded. Solu-Medrol  125 mg given. Hospitalist to admit for IPF/COPD flare.  Review of Systems: As per HPI all other systems reviewed and negative.  Past Medical History:  Diagnosis Date   Anxiety     Arthritis    fingers, left foot   C. difficile diarrhea 2015   COPD (chronic obstructive pulmonary disease) (HCC)    Depression    Eczema    H pylori ulcer    Headache(784.0)    Hypercholesterolemia    Hypertension    Interstitial lung disease (HCC)    Kidney stones    20 years ago   lung ca with recurrence 10/2021   01/2023   Pneumonia    5 years ago   Seizures (HCC)    2 years ago, cluster of seizures none since   Shingles     Past Surgical History:  Procedure Laterality Date   ABDOMINAL HYSTERECTOMY     ADENOIDECTOMY     APPENDECTOMY     BRONCHIAL BIOPSY  12/20/2021   Procedure: BRONCHIAL BIOPSIES;  Surgeon: Prudy Brownie, DO;  Location: [facility];  Service: Pulmonary;;   BRONCHIAL BRUSHINGS  12/20/2021   Procedure: BRONCHIAL BRUSHINGS;  Surgeon: Prudy Brownie, DO;  Location: [facility];  Service: Pulmonary;;   BRONCHIAL NEEDLE ASPIRATION BIOPSY  12/20/2021   Procedure: BRONCHIAL NEEDLE ASPIRATION BIOPSIES;  Surgeon: Prudy Brownie, DO;  Location: [facility];  Service: Pulmonary;;   CESAREAN SECTION     CHOLECYSTECTOMY     COLONOSCOPY  06/10/2012   RMR: Colonic polyps -removed as described above.    FINE NEEDLE ASPIRATION  12/20/2021   Procedure: FINE NEEDLE ASPIRATION (FNA) LINEAR;  Surgeon: Prudy Brownie, DO;  Location: [facility];  Service: Pulmonary;;   KNEE ARTHROSCOPY  1973  right knee,  torn cart   LUMBAR DISC SURGERY  05/12/2015   L4 L5   LUMBAR LAMINECTOMY/DECOMPRESSION MICRODISCECTOMY Left 05/12/2015   Procedure: Left L4-5 Microdiscectomy;  Surgeon: Adah Acron, MD;  Location: [facility] OR;  Service: Orthopedics;  Laterality: Left;   TONSILLECTOMY     VIDEO BRONCHOSCOPY WITH ENDOBRONCHIAL ULTRASOUND Bilateral 12/20/2021   Procedure: VIDEO BRONCHOSCOPY WITH ENDOBRONCHIAL ULTRASOUND;  Surgeon: Prudy Brownie, DO;  Location: [facility];  Service: Pulmonary;  Laterality: Bilateral;   VIDEO BRONCHOSCOPY WITH RADIAL ENDOBRONCHIAL ULTRASOUND  12/20/2021    Procedure: RADIAL ENDOBRONCHIAL ULTRASOUND;  Surgeon: Prudy Brownie, DO;  Location: [facility];  Service: Pulmonary;;     reports that she quit smoking about 2 years ago. Her smoking use included cigarettes. She started smoking about 32 years ago. She has a 15 pack-year smoking history. She has never used smokeless tobacco. She reports current alcohol use of about 3.0 standard drinks of alcohol per week. She reports that she does not use drugs.  Allergies  Allergen Reactions   Penicillins Shortness Of Breath   Alpha-Gal Other (See Comments)    Abdominal Pain All four legged mammals; pork, beef, lamb, deer, etc.    Family History  Problem Relation Age of Onset   Arthritis Mother    Asthma Mother    Depression Mother    Hyperlipidemia Mother    Varicose Veins Mother    Arthritis Father    Heart disease Father    Hyperlipidemia Father    Vision loss Father    Cancer Sister    COPD Sister    Early death Sister    Cancer Brother    Alcohol abuse Maternal Grandfather    Colon cancer Neg Hx     Prior to Admission medications   Medication Sig Start Date End Date Taking? Authorizing Provider  acetaminophen  (TYLENOL ) 500 MG tablet Take 1,500 mg by mouth every 4 (four) hours as needed for mild pain (pain score 1-3).   Yes [provider]  DULoxetine (CYMBALTA) 30 MG capsule Take 30 mg by mouth daily. 03/06/24  Yes [provider]  gabapentin (NEURONTIN) 100 MG capsule Take 200 mg by mouth 3 (three) times daily. 03/06/24  Yes [provider]  HYDROcodone  bit-homatropine (HYCODAN) 5-1.5 MG/5ML syrup Take 5 mLs by mouth every 6 (six) hours as needed for cough. 03/19/24  Yes Maire Scot, MD  lidocaine  (LIDODERM ) 5 % Place 1 patch onto the skin daily. Remove & Discard patch within 12 hours or as directed by MD 02/15/24  Yes Kelsey Patricia, MD  loratadine  (CLARITIN ) 10 MG tablet Take 10 mg by mouth daily.   Yes [provider]  omeprazole  (PRILOSEC) 20  MG capsule Take 1 capsule (20 mg total) by mouth 2 (two) times daily. (Needs to be seen before next refill) Patient taking differently: Take 20 mg by mouth at bedtime. (Needs to be seen before next refill) 11/15/20  Yes Joyce, Britney F, FNP  Pirfenidone  (ESBRIET ) 267 MG TABS Take 3 tablets (801 mg total) by mouth 3 (three) times daily with meals. (Account: [email or web address]) 11/14/23  Yes Maire Scot, MD  Polyethyl Glycol-Propyl Glycol 0.4-0.3 % SOLN Place 1 drop into both eyes in the morning and at bedtime.   Yes [provider]  Probiotic Product (PROBIOTIC DAILY PO) Take 1 capsule by mouth daily.   Yes [provider]  rosuvastatin (CRESTOR) 10 MG tablet Take 10 mg by mouth at bedtime. 12/05/21  Yes [provider]  saline (  AYR) GEL Place 1 Application into both nostrils in the morning and at bedtime.   Yes [provider]    Physical Exam: Vitals:   03/27/24 1317 03/27/24 1350 03/27/24 1500 03/27/24 1545  BP: 97/64  (!) 106/55 (!) 106/55  Pulse: (!) 102  91 87  Resp: (!) 23  (!) 21 (!) 24  Temp: 97.6 F (36.4 C)     TempSrc: Oral     SpO2: 97% 96% 95% 100%  Weight: 62.1 kg     Height: 5' 6 (1.676 m)       Constitutional: NAD, calm, comfortable Vitals:   03/27/24 1317 03/27/24 1350 03/27/24 1500 03/27/24 1545  BP: 97/64  (!) 106/55 (!) 106/55  Pulse: (!) 102  91 87  Resp: (!) 23  (!) 21 (!) 24  Temp: 97.6 F (36.4 C)     TempSrc: Oral     SpO2: 97% 96% 95% 100%  Weight: 62.1 kg     Height: 5' 6 (1.676 m)      Eyes: PERRL, lids and conjunctivae normal ENMT: Mucous membranes are moist. Neck: normal, supple, no masses, no thyromegaly Respiratory: faint crackles present, no wheezing, no crackles. Normal respiratory effort. No accessory muscle use.  Cardiovascular: Regular rate and rhythm, no murmurs / rubs / gallops. No extremity edema. Extremities warm. Abdomen: no tenderness, no masses palpated. No hepatosplenomegaly.  Bowel sounds positive.  Musculoskeletal: no clubbing / cyanosis. No joint deformity upper and lower extremities.  Skin: no rashes, lesions, ulcers. No induration Neurologic: No facial asymmetry, moving extremities spontaneously, speech fluent.  Psychiatric: Normal judgment and insight. Alert and oriented x 3. Normal mood.   Labs on Admission: I have personally reviewed following labs and imaging studies  CBC: Recent Labs  Lab 03/27/24 1345  WBC 6.2  NEUTROABS 4.5  HGB 10.0*  HCT 31.2*  MCV 96.6  PLT 286   Basic Metabolic Panel: Recent Labs  Lab 03/27/24 1345  NA 135  K 3.9  CL 102  CO2 23  GLUCOSE 118*  BUN 15  CREATININE 0.57  CALCIUM 9.3   GFR: Estimated Creatinine Clearance: 64.8 mL/min (by C-G formula based on SCr of 0.57 mg/dL). Liver Function Tests: Recent Labs  Lab 03/27/24 1345  AST 40  ALT 57*  ALKPHOS 109  BILITOT 0.4  PROT 8.0  ALBUMIN 3.4*   Urine analysis:    Component Value Date/Time   COLORURINE YELLOW 03/27/2024 1432   APPEARANCEUR CLEAR 03/27/2024 1432   APPEARANCEUR Clear 03/10/2022 0859   LABSPEC 1.019 03/27/2024 1432   PHURINE 5.0 03/27/2024 1432   GLUCOSEU NEGATIVE 03/27/2024 1432   HGBUR NEGATIVE 03/27/2024 1432   BILIRUBINUR NEGATIVE 03/27/2024 1432   BILIRUBINUR Negative 03/10/2022 0859   KETONESUR NEGATIVE 03/27/2024 1432   PROTEINUR NEGATIVE 03/27/2024 1432   UROBILINOGEN 0.2 10/06/2012 2112   NITRITE NEGATIVE 03/27/2024 1432   LEUKOCYTESUR SMALL (A) 03/27/2024 1432    Radiological Exams on Admission: CT Angio Chest PE W/Cm &/Or Wo Cm Result Date: 03/27/2024 CLINICAL DATA:  Concern for pulmonary embolism. EXAM: CT ANGIOGRAPHY CHEST WITH CONTRAST TECHNIQUE: Multidetector CT imaging of the chest was performed using the standard protocol during bolus administration of intravenous contrast. Multiplanar CT image reconstructions and MIPs were obtained to evaluate the vascular anatomy. RADIATION DOSE REDUCTION: This exam was  performed according to the departmental dose-optimization program which includes automated exposure control, adjustment of the mA and/or kV according to patient size and/or use of iterative reconstruction technique. CONTRAST:  75mL OMNIPAQUE  IOHEXOL   350 MG/ML SOLN COMPARISON:  Chest CT dated 03/04/2024. FINDINGS: Evaluation of this exam is limited due to respiratory motion. Cardiovascular: There is no cardiomegaly or pericardial effusion. Mild atherosclerotic calcification of the thoracic aorta. No aneurysmal dilatation or dissection. The origins of the great vessels of the aortic arch are patent. Evaluation of the pulmonary arteries is limited due to respiratory motion. No pulmonary artery embolus identified. Mediastinum/Nodes: Mildly enlarged left hilar lymph nodes measure 11 mm. The esophagus is grossly unremarkable. No mediastinal fluid collection. Lungs/Pleura: Background of emphysema. There is chronic post radiation changes and scarring in the right lung. A 2.2 x 1.2 cm nodular density in the right upper lobe appears slightly more rounded since the prior CT. Although this may represent a nodular scarring, recurrent disease is not excluded. PET-CT may provide better evaluation. A 7 mm left lower lobe subpleural nodule similar to prior CT. No pleural effusion or pneumothorax. The central airways are patent. Upper Abdomen: No acute abnormality. Musculoskeletal: No acute osseous pathology. Review of the MIP images confirms the above findings. IMPRESSION: 1. No CT evidence of pulmonary artery embolus. 2. Chronic post radiation changes and scarring in the right lung. A 2.2 x 1.2 cm nodular density in the right upper lobe appears slightly more rounded since the prior CT. Although this may represent a nodular scarring, recurrent disease is not excluded. PET-CT may provide better evaluation. 3. Aortic Atherosclerosis (ICD10-I70.0) and Emphysema (ICD10-J43.9). Electronically Signed   By: Angus Bark M.D.   On:  03/27/2024 15:31   DG Chest 2 View Result Date: 03/27/2024 CLINICAL DATA:  Dyspnea EXAM: CHEST - 2 VIEW COMPARISON:  Feb 24, 2024 FINDINGS: No change in the chronic pulmonary infiltrates and atelectasis of the right mid lung and right lower lobe. As well as chronic infiltrates and atelectasis of the left lower lobe. No acute cardiopulmonary infiltrates or significant pleural effusion IMPRESSION: No acute cardiopulmonary infiltrates.  No change since prior Electronically Signed   By: Fredrich Jefferson M.D.   On: 03/27/2024 14:48   EKG: Independently reviewed.  Sinus tachycardia rate 111.  QTc 433.  Artifacts present.  Assessment/Plan Principal Problem:   Idiopathic pulmonary fibrosis (HCC) Active Problems:   Generalized anxiety disorder   Stage II squamous cell carcinoma of right lung (HCC)   Chronic respiratory failure with hypoxia (HCC)   Assessment and Plan: * Idiopathic pulmonary fibrosis (HCC) Presenting with dyspnea with activity, and reported desaturations.  At rest patient's O2 sats remained above 95% on her home 4 L.  Lung exam without added sounds.  She reports a very mild chronic cough that is unchanged from baseline.  Follows with Dr. Bertrum Brodie for IPF, with radiation fibrosis and pulmonary emphysema.  She is on pirfenidone . - CTA chest negative for PE, chronic postradiation changes and scarring in the right lung.  Nodular density right lower upper lobe-nodular scarring, recurrent disease not excluded. -IV Solu-Medrol  125 mg x 1 given, continue 60 twice daily -Resume home nightly Hycodan -DuoNebs as needed -Holding off on antibiotics at this time as she has minimal chronic cough, without wheezing or rhonchi on exam.  Chronic respiratory failure with hypoxia (HCC) On 4 L at baseline, with sats > 95% at rest, with exertion she reports desaturation to 50s.   Stage II squamous cell carcinoma of right lung (HCC) Follows with Dr. Liam Redhead.  Unresectable stage IIb non-small cell lung  cancer diagnosed March 2023.  Status post chemoradiation therapy. - Follow-up with oncology for CT findings  Generalized anxiety disorder Resume  Cymbalta.   And gabapentin for nerve pain.    DVT prophylaxis: No Lovenox or heparin  2/2 alpha gal allergy .  Patient is declined Xarelto.  SCDs for now. Code Status: DNR- confirmed with patient and spouse at bedside. Family Communication: Spouse at bedside Disposition Plan: ~ 2 days Consults called: None  Admission status:  Obs tele    Author: Pati Bonine, MD 03/27/2024 6:38 PM  For on call review [email or web address].

## 2024-03-27 NOTE — Plan of Care (Signed)

## 2024-03-27 NOTE — Assessment & Plan Note (Signed)
 Presenting with dyspnea with activity, and reported desaturations.  At rest patient's O2 sats remained above 95% on her home 4 L.  Lung exam without added sounds.  She reports a very mild chronic cough that is unchanged from baseline.  Follows with Dr. Bertrum Brodie for IPF, with radiation fibrosis and pulmonary emphysema.  She is on pirfenidone . - CTA chest negative for PE, chronic postradiation changes and scarring in the right lung.  Nodular density right lower upper lobe-nodular scarring, recurrent disease not excluded. -IV Solu-Medrol  125 mg x 1 given, continue 60 twice daily -Resume home nightly Hycodan -DuoNebs as needed -Holding off on antibiotics at this time as she has minimal chronic cough, without wheezing or rhonchi on exam.

## 2024-03-27 NOTE — Plan of Care (Signed)

## 2024-03-27 NOTE — ED Triage Notes (Signed)
 Pt reports sats are dropping while doing activity at home.  Has seen O2 sats drop into the 50's. Wears 4 liters of O2 at all times. Has been turing O2 up to 8 liters at times.  S/s started yesterday.

## 2024-03-27 NOTE — Assessment & Plan Note (Signed)
 On 4 L at baseline, with sats > 95% at rest, with exertion she reports desaturation to 50s.

## 2024-03-27 NOTE — Telephone Encounter (Signed)
 FYI Only or Action Required?: FYI only for provider  Patient is followed in Pulmonology for COPD, last seen on 02/13/2024 by Maire Scot, MD. Called Nurse Triage reporting Shortness of Breath. Symptoms began yesterday. Interventions attempted: Rescue inhaler, Maintenance inhaler, Nebulizer treatments, and Home oxygen  use. Symptoms are: gradually worsening.  Triage Disposition: Go to ED Now (Notify PCP)  Patient/caregiver understands and will follow disposition?: Yes  Copied from CRM [id number]. Topic: Clinical - Red Word Triage >> Mar 27, 2024 12:01 PM Diane [name] wrote: Red Word that prompted transfer to Nurse Triage: Patient states she's having [name] hard time keeping her oxygen  levels up, experiencing SOB. Reason for Disposition  [1] MODERATE difficulty breathing (e.g., speaks in phrases, SOB even at rest, pulse 100-120) AND [2] NEW-onset or WORSE than normal  Answer Assessment - Initial Assessment Questions 1. RESPIRATORY STATUS: Describe your breathing? (e.g., wheezing, shortness of breath, unable to speak, severe coughing)      Shortness of breath 2. ONSET: When did this breathing problem begin?      Started yesterday 3. PATTERN Does the difficult breathing come and go, or has it been constant since it started?      constant 4. SEVERITY: How bad is your breathing? (e.g., mild, moderate, severe)    - MILD: No SOB at rest, mild SOB with walking, speaks normally in sentences, can lie down, no retractions, pulse < 100.    - MODERATE: SOB at rest, SOB with minimal exertion and prefers to sit, cannot lie down flat, speaks in phrases, mild retractions, audible wheezing, pulse 100-120.    - SEVERE: Very SOB at rest, speaks in single words, struggling to breathe, sitting hunched forward, retractions, pulse > 120      Mild laying down-any activity goes to severe 5. RECURRENT SYMPTOM: Have you had difficulty breathing before? If Yes, ask: When was the last time? and What happened that  time?      Patient states breathing has never been this bad 6. CARDIAC HISTORY: Do you have any history of heart disease? (e.g., heart attack, angina, bypass surgery, angioplasty)      no 7. LUNG HISTORY: Do you have any history of lung disease?  (e.g., pulmonary embolus, asthma, emphysema)     COPD, pulmonary fibrosis, lung CA 8. CAUSE: What do you think is causing the breathing problem?      Unsure-but patient states she has been less mobile due to chest pain. 9. OTHER SYMPTOMS: Do you have any other symptoms? (e.g., dizziness, runny nose, cough, chest pain, fever)     Cough, dizziness 10. O2 SATURATION MONITOR:  Do you use an oxygen  saturation monitor (pulse oximeter) at home? If Yes, ask: What is your reading (oxygen  level) today? What is your usual oxygen  saturation reading? (e.g., 95%)       95% when laying down or sitting still. Any activity pulse oximeter reading drops to 50-70% 12. TRAVEL: Have you traveled out of the country in the last month? (e.g., travel history, exposures)       no  Protocols used: Breathing Difficulty-[name]-AH

## 2024-03-27 NOTE — ED Provider Notes (Signed)
 [place] EMERGENCY DEPARTMENT AT [facility] Provider Note   CSN: [id number] Arrival date & time: 03/27/24  1307     Patient presents with: Shortness of Breath   Diane [name] is a 68 y.o. female.   Patient is a 68 year old female with a past medical history of COPD, pulmonary fibrosis, squamous cell carcinoma of the lung who presents the emergency department the chief complaint of increasing shortness of breath over the past 2 days.  Patient notes that she was recently on chronic prednisone  but notes that she did stop taking it 2 days ago.  Patient notes that she is on 4 L nasal cannula at all times but notes that she has been having to increase it up to 8 L.  Patient does note that she has desatted down into the 50s at home with ambulation.  Patient notes that she does feel okay at rest.  Patient notes that she did reach out to her pulmonologist this morning who recommended she come to the emergency department.  She notes that she has been experiencing some chronic right-sided chest pain secondary to radiation treatments.  Patient denies any fever, chills, cough, congestion, rhinorrhea, sore throat.   Shortness of Breath      Prior to Admission medications   Medication Sig Start Date End Date Taking? Authorizing Provider  DULoxetine  (CYMBALTA ) 30 MG capsule Take 30 mg by mouth daily. 03/06/24  Yes [provider]  gabapentin  (NEURONTIN ) 100 MG capsule Take by mouth. 03/06/24  Yes [provider]  HYDROcodone -acetaminophen  (NORCO/VICODIN) 5-325 MG tablet Take 1 tablet by mouth every 6 (six) hours as needed. 02/14/24  Yes [provider]  tiZANidine  (ZANAFLEX ) 2 MG tablet Take 2 mg by mouth 3 (three) times daily. 02/15/24  Yes [provider]  albuterol  (PROVENTIL ) (5 MG/ML) 0.5% nebulizer solution Take 2.5 mg by nebulization every 6 (six) hours as needed for wheezing or shortness of breath.    [provider]  albuterol  (VENTOLIN   HFA) 108 (90 Base) MCG/ACT inhaler Inhale 2 puffs into the lungs every 6 (six) hours as needed for wheezing or shortness of breath. 02/06/23   Raejean Bullock, NP  Budeson-Glycopyrrol-Formoterol  (BREZTRI  AEROSPHERE) 160-9-4.8 MCG/ACT AERO Inhale 2 puffs into the lungs in the morning and at bedtime. 02/06/23   Raejean Bullock, NP  EPINEPHrine  (AUVI-Q ) 0.3 mg/0.3 mL IJ SOAJ injection Inject 0.3 mLs (0.3 mg total) into the muscle as needed for anaphylaxis. 04/29/19   Bobbitt, Colen Daunt, MD  fluticasone  (FLONASE ) 50 MCG/ACT nasal spray Place 2 sprays into both nostrils daily. Patient taking differently: Place 2 sprays into both nostrils daily as needed for allergies. 02/18/20   Zorita Hiss, FNP  HYDROcodone  bit-homatropine (HYCODAN) 5-1.5 MG/5ML syrup Take 5 mLs by mouth every 6 (six) hours as needed for cough (lung cancer and fibrosis). 01/21/24   Maire Scot, MD  HYDROcodone  bit-homatropine (HYCODAN) 5-1.5 MG/5ML syrup Take 5 mLs by mouth every 6 (six) hours as needed for cough. 03/19/24   Maire Scot, MD  levofloxacin  (LEVAQUIN ) 500 MG tablet Take 1 tablet (500 mg total) by mouth daily. 02/15/24   Kelsey Patricia, MD  lidocaine  (LIDODERM ) 5 % Place 1 patch onto the skin daily. Remove & Discard patch within 12 hours or as directed by MD 02/15/24   Kelsey Patricia, MD  loratadine  (CLARITIN ) 10 MG tablet Take 10 mg by mouth daily.    [provider]  omeprazole  (PRILOSEC) 20 MG capsule Take 1 capsule (20 mg total) by  mouth 2 (two) times daily. (Needs to be seen before next refill) 11/15/20   Zorita Hiss, FNP  OVER THE COUNTER MEDICATION Take 1 capsule by mouth daily. Probiotic    [provider]  Pirfenidone  (ESBRIET ) 267 MG TABS Take 3 tablets (801 mg total) by mouth 3 (three) times daily with meals. (Account: [email or web address]) 11/14/23   Maire Scot, MD  predniSONE  (DELTASONE ) 10 MG tablet 40 mg daily x 2 wks, 30 mg daily x 2 wks, 20 mg daily x 2 wks, 10 mg  daily x 2 days, 5 mg daily x 2 wks, 5 mg on Mon, Wed, Fri x 2 wks and then stop 02/27/24   Maire Scot, MD  rosuvastatin  (CRESTOR ) 10 MG tablet Take 10 mg by mouth at bedtime. 12/05/21   [provider]  tizanidine  (ZANAFLEX ) 2 MG capsule Take 1 capsule (2 mg total) by mouth 3 (three) times daily as needed for muscle spasms. 02/15/24   Kelsey Patricia, MD    Allergies: Penicillins and Alpha-gal    Review of Systems  Respiratory:  Positive for shortness of breath.   All other systems reviewed and are negative.   Updated Vital Signs BP 97/64 (BP Location: Right Arm)   Pulse (!) 102   Temp 97.6 F (36.4 C) (Oral)   Resp (!) 23   Ht 5' 6 (1.676 m)   Wt 62.1 kg   SpO2 96%   BMI 22.11 kg/m   Physical Exam Vitals and nursing note reviewed.  Constitutional:      Appearance: Normal appearance.  HENT:     Head: Normocephalic and atraumatic.     Nose: Nose normal.     Mouth/Throat:     Mouth: Mucous membranes are moist.   Eyes:     Extraocular Movements: Extraocular movements intact.     Conjunctiva/sclera: Conjunctivae normal.     Pupils: Pupils are equal, round, and reactive to light.    Cardiovascular:     Rate and Rhythm: Normal rate and regular rhythm.     Pulses: Normal pulses.     Heart sounds: Normal heart sounds. No murmur heard. Pulmonary:     Effort: Pulmonary effort is normal. Tachypnea present. No respiratory distress.     Breath sounds: Examination of the right-lower field reveals rales. Examination of the left-lower field reveals rales. Wheezing and rales present. No decreased breath sounds or rhonchi.  Chest:     Chest wall: No tenderness.  Abdominal:     General: Abdomen is flat. Bowel sounds are normal.     Palpations: Abdomen is soft.     Tenderness: There is no abdominal tenderness. There is no guarding.   Musculoskeletal:        General: Normal range of motion.     Cervical back: Normal range of motion and neck supple.     Right lower  leg: No edema.     Left lower leg: No edema.   Skin:    General: Skin is warm and dry.   Neurological:     General: No focal deficit present.     Mental Status: She is alert and oriented to person, place, and time. Mental status is at baseline.   Psychiatric:        Mood and Affect: Mood normal.        Behavior: Behavior normal.        Thought Content: Thought content normal.        Judgment: Judgment normal.     (all  labs ordered are listed, but only abnormal results are displayed) Labs Reviewed  CULTURE, BLOOD (ROUTINE X 2)  CULTURE, BLOOD (ROUTINE X 2)  RESP PANEL BY RT-PCR (RSV, FLU A&B, COVID)  [id number]  COMPREHENSIVE METABOLIC PANEL WITH GFR  BRAIN NATRIURETIC PEPTIDE  CBC WITH DIFFERENTIAL/PLATELET  URINALYSIS, ROUTINE W REFLEX MICROSCOPIC  LACTIC ACID, PLASMA  LACTIC ACID, PLASMA  TROPONIN I (HIGH SENSITIVITY)    EKG: EKG Interpretation Date/Time:  Thursday March 27 2024 13:36:43 EDT Ventricular Rate:  111 PR Interval:  134 QRS Duration:  78 QT Interval:  337 QTC Calculation: 433 R Axis:   61  Text Interpretation: Normal sinus rhythm Artifact in lead(s) I II III aVR aVL aVF since last tracing no significant change Confirmed by Early Glisson ([id number]) on 03/27/2024 1:40:56 PM  Radiology: No results found.   Procedures   Medications Ordered in the ED  methylPREDNISolone  sodium succinate (SOLU-MEDROL ) 125 mg/2 mL injection 125 mg (has no administration in time range)  lactated ringers  bolus 500 mL (has no administration in time range)  ipratropium-albuterol  (DUONEB) 0.5-2.5 (3) MG/3ML nebulizer solution 3 mL (3 mLs Nebulization Given 03/27/24 1350)                                    Medical Decision Making Amount and/or Complexity of Data Reviewed Labs: ordered. Radiology: ordered.  Risk Prescription drug management. Decision regarding hospitalization.   This patient presents to the ED for concern of dyspnea, this involves an extensive number of  treatment options, and is a complaint that carries with it a high risk of complications and morbidity.  The differential diagnosis includes COPD exacerbation, pulmonary fibrosis, ACS, pulmonary embolus, pneumonia, pneumothorax, hemothorax   Co morbidities that complicate the patient evaluation  Pulmonary fibrosis, COPD, squamous cell lung cancer   Additional history obtained:  Additional history obtained from husband External records from outside source obtained and reviewed including medical records   Lab Tests:  I Ordered, and personally interpreted labs.  The pertinent results include: No leukocytosis, anemia noted, normal liver function kidney function, normal electrolytes, negative lactic acid, normal BNP, normal troponin, unremarkable urinalysis   Imaging Studies ordered:  I ordered imaging studies including CT of the chest, chest x-ray I independently visualized and interpreted imaging which showed no pulmonary embolus, scarring consistent with radiation therapy I agree with the radiologist interpretation   Cardiac Monitoring: / EKG:  The patient was maintained on a cardiac monitor.  I personally viewed and interpreted the cardiac monitored which showed an underlying rhythm of: Normal sinus rhythm, no ST/T wave changes, no ischemic changes, no STEMI   Consultations Obtained:  I requested consultation with the hospitalist,  and discussed lab and imaging findings as well as pertinent plan - they recommend: Admission   Problem List / ED Course / Critical interventions / Medication management  Patient does remain stable at this time.  Discussed with patient given her ongoing tachypnea, dyspnea with exertion and desaturations we will plan for admission to the hospital service.  Suspect that this is an exacerbation of her COPD/pulmonary fibrosis.  She has no hypoxia at rest.  CTA of the chest demonstrated no indication of pulmonary embolus.  Low suspicion for pneumonia at this  point.  Blood work has otherwise been unremarkable.  Do not suspect ACS at this time.  Troponin is negative and EKG was within normal limits.  Have discussed patient case with Dr. Zachery Hermes  Emokpae with the hospitalist service who has excepted for admission. I ordered medication including DuoNeb, Solu-Medrol , IV fluids for COPD, pulmonary fibrosis Reevaluation of the patient after these medicines showed that the patient improved I have reviewed the patients home medicines and have made adjustments as needed   Social Determinants of Health:  None   Test / Admission - Considered:  Admission     Final diagnoses:  None    ED Discharge Orders     None          Emmalene Hare 03/27/24 1641    Early Glisson, MD 03/28/24 1358

## 2024-03-27 NOTE — Assessment & Plan Note (Addendum)
 Follows with Dr. Liam Redhead.  Unresectable stage IIb non-small cell lung cancer diagnosed March 2023.  Status post chemoradiation therapy. - Follow-up with oncology for CT findings

## 2024-03-28 DIAGNOSIS — D630 Anemia in neoplastic disease: Secondary | ICD-10-CM | POA: Diagnosis present

## 2024-03-28 DIAGNOSIS — J441 Chronic obstructive pulmonary disease with (acute) exacerbation: Secondary | ICD-10-CM | POA: Diagnosis present

## 2024-03-28 DIAGNOSIS — Z66 Do not resuscitate: Secondary | ICD-10-CM | POA: Diagnosis present

## 2024-03-28 DIAGNOSIS — C3491 Malignant neoplasm of unspecified part of right bronchus or lung: Secondary | ICD-10-CM | POA: Diagnosis present

## 2024-03-28 DIAGNOSIS — Z1152 Encounter for screening for COVID-19: Secondary | ICD-10-CM | POA: Diagnosis not present

## 2024-03-28 DIAGNOSIS — I10 Essential (primary) hypertension: Secondary | ICD-10-CM | POA: Diagnosis present

## 2024-03-28 DIAGNOSIS — Z825 Family history of asthma and other chronic lower respiratory diseases: Secondary | ICD-10-CM | POA: Diagnosis not present

## 2024-03-28 DIAGNOSIS — J439 Emphysema, unspecified: Secondary | ICD-10-CM | POA: Diagnosis present

## 2024-03-28 DIAGNOSIS — F411 Generalized anxiety disorder: Secondary | ICD-10-CM | POA: Diagnosis present

## 2024-03-28 DIAGNOSIS — Z7189 Other specified counseling: Secondary | ICD-10-CM | POA: Diagnosis not present

## 2024-03-28 DIAGNOSIS — Z79899 Other long term (current) drug therapy: Secondary | ICD-10-CM | POA: Diagnosis not present

## 2024-03-28 DIAGNOSIS — Z8249 Family history of ischemic heart disease and other diseases of the circulatory system: Secondary | ICD-10-CM | POA: Diagnosis not present

## 2024-03-28 DIAGNOSIS — F32A Depression, unspecified: Secondary | ICD-10-CM | POA: Diagnosis present

## 2024-03-28 DIAGNOSIS — E7800 Pure hypercholesterolemia, unspecified: Secondary | ICD-10-CM | POA: Diagnosis present

## 2024-03-28 DIAGNOSIS — G629 Polyneuropathy, unspecified: Secondary | ICD-10-CM | POA: Diagnosis present

## 2024-03-28 DIAGNOSIS — Z91014 Allergy to mammalian meats: Secondary | ICD-10-CM | POA: Diagnosis not present

## 2024-03-28 DIAGNOSIS — J8410 Pulmonary fibrosis, unspecified: Secondary | ICD-10-CM | POA: Diagnosis present

## 2024-03-28 DIAGNOSIS — Z880 Allergy status to penicillin: Secondary | ICD-10-CM | POA: Diagnosis not present

## 2024-03-28 DIAGNOSIS — Z9981 Dependence on supplemental oxygen: Secondary | ICD-10-CM | POA: Diagnosis not present

## 2024-03-28 DIAGNOSIS — Z8261 Family history of arthritis: Secondary | ICD-10-CM | POA: Diagnosis not present

## 2024-03-28 DIAGNOSIS — J9621 Acute and chronic respiratory failure with hypoxia: Secondary | ICD-10-CM | POA: Diagnosis present

## 2024-03-28 DIAGNOSIS — Z9221 Personal history of antineoplastic chemotherapy: Secondary | ICD-10-CM | POA: Diagnosis not present

## 2024-03-28 DIAGNOSIS — J84112 Idiopathic pulmonary fibrosis: Secondary | ICD-10-CM | POA: Diagnosis present

## 2024-03-28 DIAGNOSIS — Z515 Encounter for palliative care: Secondary | ICD-10-CM | POA: Diagnosis not present

## 2024-03-28 DIAGNOSIS — Z923 Personal history of irradiation: Secondary | ICD-10-CM | POA: Diagnosis not present

## 2024-03-28 DIAGNOSIS — Z860100 Personal history of colon polyps, unspecified: Secondary | ICD-10-CM | POA: Diagnosis not present

## 2024-03-28 LAB — HIV ANTIBODY (ROUTINE TESTING W REFLEX): HIV Screen 4th Generation wRfx: NONREACTIVE

## 2024-03-28 MED ORDER — AZITHROMYCIN 250 MG PO TABS
500.0000 mg | ORAL_TABLET | Freq: Every day | ORAL | Status: AC
  Administered 2024-03-28 – 2024-04-01 (×5): 500 mg via ORAL
  Filled 2024-03-28 (×5): qty 2

## 2024-03-28 MED ORDER — IPRATROPIUM-ALBUTEROL 0.5-2.5 (3) MG/3ML IN SOLN
3.0000 mL | Freq: Four times a day (QID) | RESPIRATORY_TRACT | Status: DC
  Administered 2024-03-28 (×3): 3 mL via RESPIRATORY_TRACT
  Filled 2024-03-28 (×3): qty 3

## 2024-03-28 MED ORDER — DM-GUAIFENESIN ER 30-600 MG PO TB12
1.0000 | ORAL_TABLET | Freq: Two times a day (BID) | ORAL | Status: DC
  Administered 2024-03-28 – 2024-04-01 (×9): 1 via ORAL
  Filled 2024-03-28 (×9): qty 1

## 2024-03-28 MED ORDER — METHYLPREDNISOLONE SODIUM SUCC 40 MG IJ SOLR
40.0000 mg | Freq: Two times a day (BID) | INTRAMUSCULAR | Status: DC
  Administered 2024-03-28 – 2024-03-29 (×2): 40 mg via INTRAVENOUS
  Filled 2024-03-28 (×2): qty 1

## 2024-03-28 MED ORDER — ALBUTEROL SULFATE (2.5 MG/3ML) 0.083% IN NEBU: 2.5000 mg | INHALATION_SOLUTION | RESPIRATORY_TRACT | Status: DC | PRN

## 2024-03-28 MED ORDER — TRAZODONE HCL 50 MG PO TABS
100.0000 mg | ORAL_TABLET | Freq: Every day | ORAL | Status: DC
  Administered 2024-03-28 – 2024-03-31 (×4): 100 mg via ORAL
  Filled 2024-03-28 (×4): qty 2

## 2024-03-28 NOTE — Progress Notes (Signed)
 Mobility Specialist Progress Note:    03/28/24 1312  Mobility  Activity Ambulated with assistance in room  Level of Assistance Standby assist, set-up cues, supervision of patient - no hands on  Assistive Device None  Distance Ambulated (ft) 40 ft  Range of Motion/Exercises Active;All extremities  Activity Response Tolerated well  Mobility Referral Yes  Mobility visit 1 Mobility  Mobility Specialist Start Time (ACUTE ONLY) 1312  Mobility Specialist Stop Time (ACUTE ONLY) 1330  Mobility Specialist Time Calculation (min) (ACUTE ONLY) 18 min   Pt received in bed, husband in room. Agreeable to mobility, required SBA to stand and ambulate with no AD. Tolerated well, SpO2 93% on 5.5L at rest. SpO2 dropped to 84% on 5.5L during ambulation. Returned pt supine, all needs met.  Raelene Trew Mobility Specialist Please contact via Special educational needs teacher or  Rehab office at [phone]

## 2024-03-28 NOTE — Care Management Obs Status (Signed)
 MEDICARE OBSERVATION STATUS NOTIFICATION   Patient Details  Name: Diane [name] MRN: [id number] Date of Birth: 11/13/1955   Medicare Observation Status Notification Given:  Yes    Tarun Patchell L Moxie Kalil 03/28/2024, 3:10 PM

## 2024-03-28 NOTE — TOC CM/SW Note (Signed)
 Transition of Care [facility]) - Inpatient Brief Assessment   Patient Details  Name: Kloe Oates MRN: [id number] Date of Birth: 10/03/1956  Transition of Care [facility]) CM/SW Contact:    Grandville Lax, LCSWA Phone Number: 03/28/2024, 9:57 AM   Clinical Narrative: Transition of Care Department [facility]) has reviewed patient and no TOC needs have been identified at this time. We will continue to monitor patient advancement through interdiciplinary progression rounds. If new patient transition needs arise, please place a TOC consult.  Transition of Care Asessment: Insurance and Status: Insurance coverage has been reviewed Patient has primary care physician: Yes Home environment has been reviewed: From home Prior level of function:: Independent Prior/Current Home Services: No current home services Social Drivers of Health Review: SDOH reviewed no interventions necessary Readmission risk has been reviewed: Yes Transition of care needs: no transition of care needs at this time

## 2024-03-28 NOTE — Plan of Care (Signed)
 Problem: Education: Goal: Knowledge of General Education information will improve Description: Including pain rating scale, medication(s)/side effects and non-pharmacologic comfort measures 03/28/2024 0737 by Goldie Later, RN Outcome: Progressing 03/27/2024 1747 by Goldie Later, RN Outcome: Progressing   Problem: Health Behavior/Discharge Planning: Goal: Ability to manage health-related needs will improve 03/28/2024 0737 by Goldie Later, RN Outcome: Progressing 03/27/2024 1747 by Goldie Later, RN Outcome: Progressing   Problem: Clinical Measurements: Goal: Ability to maintain clinical measurements within normal limits will improve 03/28/2024 0737 by Goldie Later, RN Outcome: Progressing 03/27/2024 1747 by Goldie Later, RN Outcome: Progressing Goal: Will remain free from infection 03/28/2024 0737 by Goldie Later, RN Outcome: Progressing 03/27/2024 1747 by Goldie Later, RN Outcome: Progressing Goal: Diagnostic test results will improve 03/28/2024 0737 by Goldie Later, RN Outcome: Progressing 03/27/2024 1747 by Goldie Later, RN Outcome: Progressing Goal: Respiratory complications will improve 03/28/2024 0737 by Goldie Later, RN Outcome: Progressing 03/27/2024 1747 by Goldie Later, RN Outcome: Progressing Goal: Cardiovascular complication will be avoided 03/28/2024 0737 by Goldie Later, RN Outcome: Progressing 03/27/2024 1747 by Goldie Later, RN Outcome: Progressing   Problem: Activity: Goal: Risk for activity intolerance will decrease 03/28/2024 0737 by Goldie Later, RN Outcome: Progressing 03/27/2024 1747 by Goldie Later, RN Outcome: Progressing   Problem: Nutrition: Goal: Adequate nutrition will be maintained 03/28/2024 0737 by Goldie Later, RN Outcome: Progressing 03/27/2024 1747 by Goldie Later, RN Outcome: Progressing   Problem: Coping: Goal: Level of anxiety will decrease 03/28/2024 0737 by Goldie Later, RN Outcome: Progressing 03/27/2024 1747 by Goldie Later, RN Outcome: Progressing   Problem: Elimination: Goal: Will not experience complications related to bowel motility 03/28/2024 0737 by Goldie Later, RN Outcome: Progressing 03/27/2024 1747 by Goldie Later, RN Outcome: Progressing Goal: Will not experience complications related to urinary retention 03/28/2024 0737 by Goldie Later, RN Outcome: Progressing 03/27/2024 1747 by Goldie Later, RN Outcome: Progressing   Problem: Pain Managment: Goal: General experience of comfort will improve and/or be controlled 03/28/2024 0737 by Goldie Later, RN Outcome: Progressing 03/27/2024 1747 by Goldie Later, RN Outcome: Progressing   Problem: Safety: Goal: Ability to remain free from injury will improve 03/28/2024 0737 by Goldie Later, RN Outcome: Progressing 03/27/2024 1747 by Goldie Later, RN Outcome: Progressing   Problem: Skin Integrity: Goal: Risk for impaired skin integrity will decrease 03/28/2024 0737 by Goldie Later, RN Outcome: Progressing 03/27/2024 1747 by Goldie Later, RN Outcome: Progressing   Problem: Education: Goal: Knowledge of disease or condition will improve 03/28/2024 0737 by Goldie Later, RN Outcome: Progressing 03/27/2024 1747 by Goldie Later, RN Outcome: Progressing Goal: Knowledge of the prescribed therapeutic regimen will improve 03/28/2024 0737 by Goldie Later, RN Outcome: Progressing 03/27/2024 1747 by Goldie Later, RN Outcome: Progressing Goal: Individualized Educational Video(s) 03/28/2024 0737 by Goldie Later, RN Outcome: Progressing 03/27/2024 1747 by Goldie Later, RN Outcome: Progressing   Problem: Activity: Goal: Ability to tolerate increased activity will improve 03/28/2024 0737 by Goldie Later, RN Outcome: Progressing 03/27/2024 1747 by Goldie Later, RN Outcome: Progressing Goal: Will verbalize the importance of balancing activity with adequate rest periods 03/28/2024 0737 by Goldie Later, RN Outcome: Progressing 03/27/2024 1747  by Goldie Later, RN Outcome: Progressing   Problem: Respiratory: Goal: Ability to maintain a clear airway will improve 03/28/2024 0737 by Goldie Later, RN Outcome:  Progressing 03/27/2024 1747 by Goldie Later, RN Outcome: Progressing Goal: Levels of oxygenation will improve 03/28/2024 0737 by Goldie Later, RN Outcome: Progressing 03/27/2024 1747 by Goldie Later, RN Outcome: Progressing Goal: Ability to maintain adequate ventilation will improve 03/28/2024 0737 by Goldie Later, RN Outcome: Progressing 03/27/2024 1747 by Goldie Later, RN Outcome: Progressing

## 2024-03-28 NOTE — Telephone Encounter (Signed)
 Spoke to Dr Delcie Fees the week when inpatient palliative care is available recommended palliative care consultation for symptom relief of cough and pain.  She will need outpatient palliative care appointment at the oncology clinic anyway

## 2024-03-28 NOTE — Progress Notes (Signed)
 PROGRESS NOTE     Diane [name], is a 68 y.o. female, DOB - [birth year], BJY:[id number]  Admit date - 03/27/2024   Admitting Physician Drew Lips Quintella Buck, MD  Outpatient Primary MD for the patient is [name], MD  LOS - 0  Chief Complaint  Patient presents with   Shortness of Breath      Brief Narrative:   68 y.o. female with medical history significant for idiopathic pulmonary fibrosis, COPD/Emphysena, anxiety, lung cancer--admitted on 03/27/24 with Presumed IPF/COPD Flare up after discontinuing her steroid taper abruptly.     -Assessment and Plan: 1)Idiopathic pulmonary fibrosis flare--- she was on 4 week Prednisone  taper until she abruptly on 03/23/24 and then she developed worsening resp symptoms.  -Presented with dyspnea with activity, and reported desaturations. -Discussed with  Dr. Bertrum Brodie for IPF, with radiation fibrosis and pulmonary emphysema.   -c/n Pirfenidone . - CTA chest negative for PE, chronic postradiation changes and scarring in the right lung.  Nodular density right lower upper lobe-nodular scarring, recurrent disease not excluded. -c/n IV Solu-Medrol   -Resume home nightly Hycodan -DuoNebs as needed -Bronchiodilators as needed  - 2)Chronic Respiratory Failure with Hypoxia-- On 4 L at baseline, with sats > 95% at rest, with exertion she reports desaturation to 50s.   3)Stage II squamous cell carcinoma of right lung  Follows with Dr. Liam Redhead.  Unresectable stage IIb non-small cell lung cancer diagnosed March 2023.  Status post chemoradiation therapy. - Follow-up with oncology for CT findings  4)Generalized anxiety disorder C/n Cymbalta  and Gabapentin  for nerve pain.  5)COPD/Emphysema--- solumedrol and azithromycin  --DuoNebs as needed -Bronchiodilators as needed  Status is: Inpatient   Disposition: The patient is from: Home              Anticipated d/c is to: Home              Anticipated d/c date is: 2 days              Patient currently is  not medically stable to d/c. Barriers: Not Clinically Stable-   Code Status :  -  Code Status: Limited: Do not attempt resuscitation (DNR) -DNR-LIMITED -Do Not Intubate/DNI    Family Communication:     (patient is alert, awake and coherent)  -Discussed with Husband  DVT Prophylaxis  :   - SCDs  rivaroxaban  (XARELTO ) tablet 10 mg Start: 03/27/24 1830 Place and maintain sequential compression device Start: 03/27/24 1754 rivaroxaban  (XARELTO ) tablet 10 mg   Lab Results  Component Value Date   PLT 286 03/27/2024   Inpatient Medications  Scheduled Meds:  azithromycin   500 mg Oral Daily   dextromethorphan-guaiFENesin   1 tablet Oral BID   DULoxetine   30 mg Oral Daily   gabapentin   200 mg Oral TID   ipratropium-albuterol   3 mL Nebulization Q6H   loratadine   10 mg Oral Daily   methylPREDNISolone  (SOLU-MEDROL ) injection  40 mg Intravenous Q12H   pantoprazole   40 mg Oral Daily   Pirfenidone   801 mg Oral TID WC   rivaroxaban   10 mg Oral Daily   rosuvastatin   10 mg Oral QHS   traZODone  100 mg Oral QHS   Continuous Infusions: PRN Meds:.acetaminophen  **OR** acetaminophen , albuterol , HYDROcodone  bit-homatropine, ondansetron  **OR** ondansetron  (ZOFRAN ) IV, polyethylene glycol   Anti-infectives (From admission, onward)    Start     Dose/Rate Route Frequency Ordered Stop   03/28/24 1000  azithromycin  (ZITHROMAX ) tablet 500 mg        500 mg Oral Daily 03/28/24 0818  04/02/24 0959       Subjective: Diane [name] today has no fevers, no emesis,  No chest pain,   - Husband at bedside, questions answered Cough is Not productive,  Shob and Hypoxia persist---desaturates easily with minimal activity  Objective: Vitals:   03/28/24 1049 03/28/24 1100 03/28/24 1235 03/28/24 1509  BP:   122/67   Pulse:   93   Resp:   19   Temp: 98.6 F (37 C) 98.4 F (36.9 C) 98 F (36.7 C)   TempSrc: Oral  Oral   SpO2: (!) 89% 95% 92% 94%  Weight:      Height:        Intake/Output Summary  (Last 24 hours) at 03/28/2024 1912 Last data filed at 03/28/2024 1700 Gross per 24 hour  Intake 720 ml  Output --  Net 720 ml   Filed Weights   03/27/24 1317  Weight: 62.1 kg    Physical Exam Gen:- Awake Alert,  in no apparent distress  HEENT:- [place].AT, No sclera icterus Nose- [place] 6L/min (baseline usually 4L/min) Neck-Supple Neck,No JVD,.  Lungs- Velcro type rales, no wheezing, Fair air movement  CV- S1, S2 normal, regular  Abd-  +ve B.Sounds, Abd Soft, No tenderness,    Extremity/Skin:- No  edema, pedal pulses present  Psych-affect is appropriate, oriented x3 Neuro-generalized weakness, no new focal deficits, no tremors  Data Reviewed: I have personally reviewed following labs and imaging studies  CBC: Recent Labs  Lab 03/27/24 1345  WBC 6.2  NEUTROABS 4.5  HGB 10.0*  HCT 31.2*  MCV 96.6  PLT 286   Basic Metabolic Panel: Recent Labs  Lab 03/27/24 1345  NA 135  K 3.9  CL 102  CO2 23  GLUCOSE 118*  BUN 15  CREATININE 0.57  CALCIUM  9.3   GFR: Estimated Creatinine Clearance: 64.8 mL/min (by C-G formula based on SCr of 0.57 mg/dL). Liver Function Tests: Recent Labs  Lab 03/27/24 1345  AST 40  ALT 57*  ALKPHOS 109  BILITOT 0.4  PROT 8.0  ALBUMIN 3.4*   Recent Results (from the past 240 hours)  Resp panel by RT-PCR (RSV, Flu A&B, Covid) Anterior Nasal Swab     Status: None   Collection Time: 03/27/24  1:23 PM   Specimen: Anterior Nasal Swab  Result Value Ref Range Status   SARS Coronavirus 2 by RT PCR NEGATIVE NEGATIVE Final    Comment: (NOTE) SARS-CoV-2 target nucleic acids are NOT DETECTED.  The SARS-CoV-2 RNA is generally detectable in upper respiratory specimens during the acute phase of infection. The lowest concentration of SARS-CoV-2 viral copies this assay can detect is 138 copies/mL. A negative result does not preclude SARS-Cov-2 infection and should not be used as the sole basis for treatment or other patient management decisions. A negative  result may occur with  improper specimen collection/handling, submission of specimen other than nasopharyngeal swab, presence of viral mutation(s) within the areas targeted by this assay, and inadequate number of viral copies(<138 copies/mL). A negative result must be combined with clinical observations, patient history, and epidemiological information. The expected result is Negative.  Fact Sheet for Patients:  BloggerCourse.com  Fact Sheet for Healthcare Providers:  SeriousBroker.it  This test is no t yet approved or cleared by the [place]  FDA and  has been authorized for detection and/or diagnosis of SARS-CoV-2 by FDA under an Emergency Use Authorization (EUA). This EUA will remain  in effect (meaning this test can be used) for the duration of the COVID-19  declaration under Section 564(b)(1) of the Act, 21 U.S.C.section 360bbb-3(b)(1), unless the authorization is terminated  or revoked sooner.       Influenza A by PCR NEGATIVE NEGATIVE Final   Influenza B by PCR NEGATIVE NEGATIVE Final    Comment: (NOTE) The Xpert Xpress SARS-CoV-2/FLU/RSV plus assay is intended as an aid in the diagnosis of influenza from Nasopharyngeal swab specimens and should not be used as a sole basis for treatment. Nasal washings and aspirates are unacceptable for Xpert Xpress SARS-CoV-2/FLU/RSV testing.  Fact Sheet for Patients: BloggerCourse.com  Fact Sheet for Healthcare Providers: SeriousBroker.it  This test is not yet approved or cleared by the [place]  FDA and has been authorized for detection and/or diagnosis of SARS-CoV-2 by FDA under an Emergency Use Authorization (EUA). This EUA will remain in effect (meaning this test can be used) for the duration of the COVID-19 declaration under Section 564(b)(1) of the Act, 21 U.S.C. section 360bbb-3(b)(1), unless the authorization is  terminated or revoked.     Resp Syncytial Virus by PCR NEGATIVE NEGATIVE Final    Comment: (NOTE) Fact Sheet for Patients: BloggerCourse.com  Fact Sheet for Healthcare Providers: SeriousBroker.it  This test is not yet approved or cleared by the [place]  FDA and has been authorized for detection and/or diagnosis of SARS-CoV-2 by FDA under an Emergency Use Authorization (EUA). This EUA will remain in effect (meaning this test can be used) for the duration of the COVID-19 declaration under Section 564(b)(1) of the Act, 21 U.S.C. section 360bbb-3(b)(1), unless the authorization is terminated or revoked.  Performed at [facility], [address] [place], [place] [address]   Culture, blood (routine x 2)     Status: None (Preliminary result)   Collection Time: 03/27/24  1:45 PM   Specimen: BLOOD  Result Value Ref Range Status   Specimen Description BLOOD BLOOD RIGHT FOREARM  Final   Special Requests   Final    BOTTLES DRAWN AEROBIC AND ANAEROBIC Blood Culture results may not be optimal due to an inadequate volume of blood received in culture bottles   Culture   Final    NO GROWTH < 24 HOURS Performed at [facility], [address], [place], [place] [address]    Report Status PENDING  Incomplete  Culture, blood (routine x 2)     Status: None (Preliminary result)   Collection Time: 03/27/24  1:45 PM   Specimen: BLOOD  Result Value Ref Range Status   Specimen Description BLOOD LEFT ANTECUBITAL  Final   Special Requests   Final    BOTTLES DRAWN AEROBIC AND ANAEROBIC Blood Culture adequate volume   Culture   Final    NO GROWTH < 24 HOURS Performed at [facility], [address] [place], [place] [address]    Report Status PENDING  Incomplete    Radiology Studies: CT Angio Chest PE W/Cm &/Or Wo Cm Result Date: 03/27/2024 CLINICAL DATA:  Concern for pulmonary embolism. EXAM: CT ANGIOGRAPHY CHEST WITH CONTRAST TECHNIQUE:  Multidetector CT imaging of the chest was performed using the standard protocol during bolus administration of intravenous contrast. Multiplanar CT image reconstructions and MIPs were obtained to evaluate the vascular anatomy. RADIATION DOSE REDUCTION: This exam was performed according to the departmental dose-optimization program which includes automated exposure control, adjustment of the mA and/or kV according to patient size and/or use of iterative reconstruction technique. CONTRAST:  75mL OMNIPAQUE  IOHEXOL  350 MG/ML SOLN COMPARISON:  Chest CT dated 03/04/2024. FINDINGS: Evaluation of this exam is limited due to  respiratory motion. Cardiovascular: There is no cardiomegaly or pericardial effusion. Mild atherosclerotic calcification of the thoracic aorta. No aneurysmal dilatation or dissection. The origins of the great vessels of the aortic arch are patent. Evaluation of the pulmonary arteries is limited due to respiratory motion. No pulmonary artery embolus identified. Mediastinum/Nodes: Mildly enlarged left hilar lymph nodes measure 11 mm. The esophagus is grossly unremarkable. No mediastinal fluid collection. Lungs/Pleura: Background of emphysema. There is chronic post radiation changes and scarring in the right lung. A 2.2 x 1.2 cm nodular density in the right upper lobe appears slightly more rounded since the prior CT. Although this may represent a nodular scarring, recurrent disease is not excluded. PET-CT may provide better evaluation. A 7 mm left lower lobe subpleural nodule similar to prior CT. No pleural effusion or pneumothorax. The central airways are patent. Upper Abdomen: No acute abnormality. Musculoskeletal: No acute osseous pathology. Review of the MIP images confirms the above findings. IMPRESSION: 1. No CT evidence of pulmonary artery embolus. 2. Chronic post radiation changes and scarring in the right lung. A 2.2 x 1.2 cm nodular density in the right upper lobe appears slightly more rounded  since the prior CT. Although this may represent a nodular scarring, recurrent disease is not excluded. PET-CT may provide better evaluation. 3. Aortic Atherosclerosis (ICD10-I70.0) and Emphysema (ICD10-J43.9). Electronically Signed   By: Angus Bark M.D.   On: 03/27/2024 15:31   DG Chest 2 View Result Date: 03/27/2024 CLINICAL DATA:  Dyspnea EXAM: CHEST - 2 VIEW COMPARISON:  Feb 24, 2024 FINDINGS: No change in the chronic pulmonary infiltrates and atelectasis of the right mid lung and right lower lobe. As well as chronic infiltrates and atelectasis of the left lower lobe. No acute cardiopulmonary infiltrates or significant pleural effusion IMPRESSION: No acute cardiopulmonary infiltrates.  No change since prior Electronically Signed   By: Fredrich Jefferson M.D.   On: 03/27/2024 14:48   Scheduled Meds:  azithromycin   500 mg Oral Daily   dextromethorphan-guaiFENesin   1 tablet Oral BID   DULoxetine   30 mg Oral Daily   gabapentin   200 mg Oral TID   ipratropium-albuterol   3 mL Nebulization Q6H   loratadine   10 mg Oral Daily   methylPREDNISolone  (SOLU-MEDROL ) injection  40 mg Intravenous Q12H   pantoprazole   40 mg Oral Daily   Pirfenidone   801 mg Oral TID WC   rivaroxaban   10 mg Oral Daily   rosuvastatin   10 mg Oral QHS   traZODone  100 mg Oral QHS   Continuous Infusions:   LOS: 0 days   Colin Dawley M.D on 03/28/2024 at 7:12 PM  Go to [email or web address] - for contact info  [facility] - Office  [phone]  If 7PM-7AM, please contact night-coverage [email or web address] 03/28/2024, 7:12 PM

## 2024-03-29 ENCOUNTER — Encounter: Payer: Self-pay | Admitting: Internal Medicine

## 2024-03-29 DIAGNOSIS — J84112 Idiopathic pulmonary fibrosis: Secondary | ICD-10-CM | POA: Diagnosis not present

## 2024-03-29 MED ORDER — IPRATROPIUM-ALBUTEROL 0.5-2.5 (3) MG/3ML IN SOLN
3.0000 mL | Freq: Four times a day (QID) | RESPIRATORY_TRACT | Status: DC
  Administered 2024-03-29 – 2024-04-01 (×10): 3 mL via RESPIRATORY_TRACT
  Filled 2024-03-29 (×10): qty 3

## 2024-03-29 MED ORDER — ACETAMINOPHEN 650 MG RE SUPP: 650.0000 mg | Freq: Four times a day (QID) | RECTAL | Status: DC | PRN

## 2024-03-29 MED ORDER — METHYLPREDNISOLONE SODIUM SUCC 125 MG IJ SOLR
60.0000 mg | Freq: Two times a day (BID) | INTRAMUSCULAR | Status: DC
  Administered 2024-03-29 – 2024-04-01 (×6): 60 mg via INTRAVENOUS
  Filled 2024-03-29 (×6): qty 2

## 2024-03-29 MED ORDER — ACETAMINOPHEN 325 MG PO TABS
650.0000 mg | ORAL_TABLET | ORAL | Status: DC | PRN
  Administered 2024-03-30 – 2024-04-01 (×5): 650 mg via ORAL
  Filled 2024-03-29 (×5): qty 2

## 2024-03-29 MED ORDER — PIRFENIDONE 267 MG PO TABS
534.0000 mg | ORAL_TABLET | Freq: Three times a day (TID) | ORAL | Status: DC
  Administered 2024-03-29 – 2024-04-01 (×9): 534 mg via ORAL
  Filled 2024-03-29 (×6): qty 2

## 2024-03-29 NOTE — Progress Notes (Signed)
 PROGRESS NOTE  Diane [name], is a 68 y.o. female, DOB - 07-09-1956, [id number]  Admit date - 03/27/2024   Admitting Physician Lam Mccubbins Quintella Buck, MD  Outpatient Primary MD for the patient is [name], MD  LOS - 1  Chief Complaint  Patient presents with   Shortness of Breath      Brief Narrative:   68 y.o. female with medical history significant for idiopathic pulmonary fibrosis, COPD/Emphysena, anxiety, lung cancer--admitted on 03/27/24 with Presumed IPF/COPD Flare up after discontinuing her steroid taper abruptly.     -Assessment and Plan: 1)Idiopathic pulmonary fibrosis flare--- she was on 4 week Prednisone  taper until she abruptly stopped Prednsone on 03/23/24 and then she developed worsening resp symptoms.  -Presented with dyspnea with activity, and reported desaturations. -Discussed with  Dr. Bertrum Brodie for IPF, with radiation fibrosis and pulmonary emphysema.   -c/n Pirfenidone . - CTA chest negative for PE, chronic postradiation changes and scarring in the right lung.  Nodular density right lower upper lobe-nodular scarring, recurrent disease not excluded. -c/n IV Solu-Medrol   -Resume home nightly Hycodan -DuoNebs as needed -Bronchiodilators as needed  - 2)Chronic Respiratory Failure with Hypoxia-- On 4 L at baseline, - Currently [place] 5L/min (baseline usually 4L/min), desaturates with minimal activity and has to increase oxygen  to 8 L with minimal activity  3)Stage II squamous cell carcinoma of right lung  Follows with Dr. Liam Redhead.  Unresectable stage IIb non-small cell lung cancer diagnosed March 2023.  Status post chemoradiation therapy. - Follow-up with oncology for CT findings  4)Generalized anxiety disorder C/n Cymbalta  and Gabapentin  for nerve pain.  5)COPD/Emphysema--- solumedrol and azithromycin  --DuoNebs as needed -Bronchiodilators as needed  Status is: Inpatient   Disposition: The patient is from: Home              Anticipated d/c is to: Home               Anticipated d/c date is: 2 days              Patient currently is not medically stable to d/c. Barriers: Not Clinically Stable-   Code Status :  -  Code Status: Limited: Do not attempt resuscitation (DNR) -DNR-LIMITED -Do Not Intubate/DNI    Family Communication:     (patient is alert, awake and coherent)  -Discussed with Husband  DVT Prophylaxis  :   - SCDs  rivaroxaban  (XARELTO ) tablet 10 mg Start: 03/27/24 1830 Place and maintain sequential compression device Start: 03/27/24 1754 rivaroxaban  (XARELTO ) tablet 10 mg   Lab Results  Component Value Date   PLT 286 03/27/2024   Inpatient Medications  Scheduled Meds:  azithromycin   500 mg Oral Daily   dextromethorphan-guaiFENesin   1 tablet Oral BID   DULoxetine   30 mg Oral Daily   gabapentin   200 mg Oral TID   ipratropium-albuterol   3 mL Nebulization Q6H WA   loratadine   10 mg Oral Daily   methylPREDNISolone  (SOLU-MEDROL ) injection  60 mg Intravenous Q12H   pantoprazole   40 mg Oral Daily   Pirfenidone   534 mg Oral TID WC   rivaroxaban   10 mg Oral Daily   rosuvastatin   10 mg Oral QHS   traZODone  100 mg Oral QHS   Continuous Infusions: PRN Meds:.acetaminophen  **OR** acetaminophen , albuterol , HYDROcodone  bit-homatropine, ondansetron  **OR** ondansetron  (ZOFRAN ) IV, polyethylene glycol   Anti-infectives (From admission, onward)    Start     Dose/Rate Route Frequency Ordered Stop   03/28/24 1000  azithromycin  (ZITHROMAX ) tablet 500 mg  500 mg Oral Daily 03/28/24 0818 04/02/24 0959       Subjective: [name] today has no fevers, no emesis,  No chest pain,   - Husband at bedside, questions answered -Patient Sister is also at bedside Currently requiring [place] 5L/min (baseline usually 4L/min), desaturates with minimal activity and has to increase oxygen  to 8 L with minimal activity  Objective: Vitals:   03/29/24 0359 03/29/24 1100 03/29/24 1423 03/29/24 1431  BP: 118/64 124/60 133/62   Pulse: 82 (!)  109 99   Resp:      Temp: 98.1 F (36.7 C) 98.1 F (36.7 C) 98.4 F (36.9 C)   TempSrc: Oral Oral Oral   SpO2: 93% 94% 94% 91%  Weight:      Height:        Intake/Output Summary (Last 24 hours) at 03/29/2024 1802 Last data filed at 03/29/2024 1754 Gross per 24 hour  Intake 480 ml  Output --  Net 480 ml   Filed Weights   03/27/24 1317  Weight: 62.1 kg    Physical Exam Gen:- Awake Alert,  in no apparent distress  HEENT:- [place].AT, No sclera icterus Nose-  5L/min (baseline usually 4L/min), desaturates with minimal activity and has to increase oxygen  to 8 L with minimal activity Neck-Supple Neck,No JVD,.  Lungs- Velcro type rales, no wheezing, Fair air movement  CV- S1, S2 normal, regular  Abd-  +ve B.Sounds, Abd Soft, No tenderness,    Extremity/Skin:- No  edema, pedal pulses present  Psych-affect is appropriate, oriented x3 Neuro-generalized weakness, no new focal deficits, no tremors  Data Reviewed: I have personally reviewed following labs and imaging studies  CBC: Recent Labs  Lab 03/27/24 1345  WBC 6.2  NEUTROABS 4.5  HGB 10.0*  HCT 31.2*  MCV 96.6  PLT 286   Basic Metabolic Panel: Recent Labs  Lab 03/27/24 1345  NA 135  K 3.9  CL 102  CO2 23  GLUCOSE 118*  BUN 15  CREATININE 0.57  CALCIUM  9.3   GFR: Estimated Creatinine Clearance: 64.8 mL/min (by C-G formula based on SCr of 0.57 mg/dL). Liver Function Tests: Recent Labs  Lab 03/27/24 1345  AST 40  ALT 57*  ALKPHOS 109  BILITOT 0.4  PROT 8.0  ALBUMIN 3.4*   Recent Results (from the past 240 hours)  Resp panel by RT-PCR (RSV, Flu A&B, Covid) Anterior Nasal Swab     Status: None   Collection Time: 03/27/24  1:23 PM   Specimen: Anterior Nasal Swab  Result Value Ref Range Status   SARS Coronavirus 2 by RT PCR NEGATIVE NEGATIVE Final    Comment: (NOTE) SARS-CoV-2 target nucleic acids are NOT DETECTED.  The SARS-CoV-2 RNA is generally detectable in upper respiratory specimens during the  acute phase of infection. The lowest concentration of SARS-CoV-2 viral copies this assay can detect is 138 copies/mL. A negative result does not preclude SARS-Cov-2 infection and should not be used as the sole basis for treatment or other patient management decisions. A negative result may occur with  improper specimen collection/handling, submission of specimen other than nasopharyngeal swab, presence of viral mutation(s) within the areas targeted by this assay, and inadequate number of viral copies(<138 copies/mL). A negative result must be combined with clinical observations, patient history, and epidemiological information. The expected result is Negative.  Fact Sheet for Patients:  BloggerCourse.com  Fact Sheet for Healthcare Providers:  SeriousBroker.it  This test is no t yet approved or cleared by the [place]  FDA and  has been authorized for detection and/or diagnosis of SARS-CoV-2 by FDA under an Emergency Use Authorization (EUA). This EUA will remain  in effect (meaning this test can be used) for the duration of the COVID-19 declaration under Section 564(b)(1) of the Act, 21 U.S.C.section 360bbb-3(b)(1), unless the authorization is terminated  or revoked sooner.       Influenza A by PCR NEGATIVE NEGATIVE Final   Influenza B by PCR NEGATIVE NEGATIVE Final    Comment: (NOTE) The Xpert Xpress SARS-CoV-2/FLU/RSV plus assay is intended as an aid in the diagnosis of influenza from Nasopharyngeal swab specimens and should not be used as a sole basis for treatment. Nasal washings and aspirates are unacceptable for Xpert Xpress SARS-CoV-2/FLU/RSV testing.  Fact Sheet for Patients: BloggerCourse.com  Fact Sheet for Healthcare Providers: SeriousBroker.it  This test is not yet approved or cleared by the [place]  FDA and has been authorized for detection and/or  diagnosis of SARS-CoV-2 by FDA under an Emergency Use Authorization (EUA). This EUA will remain in effect (meaning this test can be used) for the duration of the COVID-19 declaration under Section 564(b)(1) of the Act, 21 U.S.C. section 360bbb-3(b)(1), unless the authorization is terminated or revoked.     Resp Syncytial Virus by PCR NEGATIVE NEGATIVE Final    Comment: (NOTE) Fact Sheet for Patients: BloggerCourse.com  Fact Sheet for Healthcare Providers: SeriousBroker.it  This test is not yet approved or cleared by the [place]  FDA and has been authorized for detection and/or diagnosis of SARS-CoV-2 by FDA under an Emergency Use Authorization (EUA). This EUA will remain in effect (meaning this test can be used) for the duration of the COVID-19 declaration under Section 564(b)(1) of the Act, 21 U.S.C. section 360bbb-3(b)(1), unless the authorization is terminated or revoked.  Performed at [facility], [address], [place], [place] [address]   Culture, blood (routine x 2)     Status: None (Preliminary result)   Collection Time: 03/27/24  1:45 PM   Specimen: BLOOD  Result Value Ref Range Status   Specimen Description BLOOD BLOOD RIGHT FOREARM  Final   Special Requests   Final    BOTTLES DRAWN AEROBIC AND ANAEROBIC Blood Culture results may not be optimal due to an inadequate volume of blood received in culture bottles   Culture   Final    NO GROWTH 2 DAYS Performed at [facility], [address], [place], [place] [address]    Report Status PENDING  Incomplete  Culture, blood (routine x 2)     Status: None (Preliminary result)   Collection Time: 03/27/24  1:45 PM   Specimen: BLOOD  Result Value Ref Range Status   Specimen Description BLOOD LEFT ANTECUBITAL  Final   Special Requests   Final    BOTTLES DRAWN AEROBIC AND ANAEROBIC Blood Culture adequate volume   Culture   Final    NO GROWTH 2 DAYS Performed at [facility], [address], [place], [place] [address]    Report Status PENDING  Incomplete    Scheduled Meds:  azithromycin   500 mg Oral Daily   dextromethorphan-guaiFENesin   1 tablet Oral BID   DULoxetine   30 mg Oral Daily   gabapentin   200 mg Oral TID   ipratropium-albuterol   3 mL Nebulization Q6H WA   loratadine   10 mg Oral Daily   methylPREDNISolone  (SOLU-MEDROL ) injection  60 mg Intravenous Q12H   pantoprazole   40 mg Oral Daily   Pirfenidone   534 mg Oral TID WC   rivaroxaban   10 mg Oral Daily   rosuvastatin   10 mg Oral QHS   traZODone  100 mg Oral QHS   Continuous Infusions:   LOS: 1 day   Colin Dawley M.D on 03/29/2024 at 6:02 PM  Go to [email or web address] - for contact info  [facility] - Office  [phone]  If 7PM-7AM, please contact night-coverage [email or web address] 03/29/2024, 6:02 PM

## 2024-03-29 NOTE — Plan of Care (Signed)
  Problem: Pain Managment: Goal: General experience of comfort will improve and/or be controlled Outcome: Progressing   Problem: Safety: Goal: Ability to remain free from injury will improve Outcome: Progressing   Problem: Skin Integrity: Goal: Risk for impaired skin integrity will decrease Outcome: Progressing

## 2024-03-29 NOTE — Progress Notes (Signed)
 Mobility Specialist Progress Note:    03/29/24 1115  Mobility  Activity Ambulated with assistance in room  Level of Assistance Contact guard assist, steadying assist  Assistive Device None  Distance Ambulated (ft) 30 ft  Range of Motion/Exercises Active;All extremities  Activity Response Tolerated well  Mobility Referral Yes  Mobility visit 1 Mobility  Mobility Specialist Start Time (ACUTE ONLY) 1115  Mobility Specialist Stop Time (ACUTE ONLY) 1135  Mobility Specialist Time Calculation (min) (ACUTE ONLY) 20 min   Pt received in bed,husband in room. Agreeable to mobility, required CGA to stand and ambulate with no AD. Tolerated well, SpO2 94% on 6L at rest. SpO2 89-93% on 6L during ambulation. Returned pt supine, all needs met.  Chevie Birkhead Mobility Specialist Please contact via Special educational needs teacher or  Rehab office at [phone]

## 2024-03-29 NOTE — Progress Notes (Signed)
 [phone] Patient in restroom , unavailable for nebulizer at this time.

## 2024-03-29 NOTE — Progress Notes (Signed)
 Changed nasal cannula to high flow cannula at patients request. Placed NRB mask in room for when patient goes to bathroom and becomes short of breath. Patient is chronic oxygen  dependant at 6 or more liters of oxygen . Patient has pulmonary fibrosis.

## 2024-03-29 NOTE — Plan of Care (Signed)
  Problem: Respiratory: Goal: Ability to maintain a clear airway will improve Outcome: Progressing Goal: Levels of oxygenation will improve Outcome: Progressing Goal: Ability to maintain adequate ventilation will improve Outcome: Progressing    O2 at 5 L [place] with sats 94%, able to walk to bathroom with assist less short of breath

## 2024-03-30 DIAGNOSIS — J84112 Idiopathic pulmonary fibrosis: Secondary | ICD-10-CM | POA: Diagnosis not present

## 2024-03-30 LAB — HEMOGLOBIN A1C
Hgb A1c MFr Bld: 5.4 % (ref 4.8–5.6)
Mean Plasma Glucose: 108.28 mg/dL

## 2024-03-30 LAB — GLUCOSE, CAPILLARY
Glucose-Capillary: 156 mg/dL — ABNORMAL HIGH (ref 70–99)
Glucose-Capillary: 126 mg/dL — ABNORMAL HIGH (ref 70–99)
Glucose-Capillary: 138 mg/dL — ABNORMAL HIGH (ref 70–99)

## 2024-03-30 MED ORDER — INSULIN ASPART 100 UNIT/ML IJ SOLN: 0.0000 [IU] | Freq: Three times a day (TID) | INTRAMUSCULAR | Status: DC

## 2024-03-30 MED ORDER — INSULIN ASPART 100 UNIT/ML IJ SOLN: 0.0000 [IU] | Freq: Every day | INTRAMUSCULAR | Status: DC

## 2024-03-30 NOTE — Progress Notes (Signed)
 PROGRESS NOTE  Donnetta Gillin, is a 68 y.o. female, DOB - 06/02/1956, BJY:[id number]  Admit date - 03/27/2024   Admitting Physician Javonna Balli Quintella Buck, MD  Outpatient Primary MD for the patient is [name], MD  LOS - 2  Chief Complaint  Patient presents with   Shortness of Breath      Brief Narrative:   68 y.o. female with medical history significant for idiopathic pulmonary fibrosis, COPD/Emphysena, anxiety, lung cancer--admitted on 03/27/24 with Presumed IPF/COPD Flare up after discontinuing her steroid taper abruptly.     -Assessment and Plan: 1)Idiopathic Pulmonary Fibrosis Flare--- she was on 4 week Prednisone  taper until she abruptly stopped Prednsone on 03/23/24 and then she developed worsening resp symptoms.  -Presented with dyspnea with activity, and reported desaturations. -Discussed with  Dr. Bertrum Brodie for IPF, with radiation fibrosis and pulmonary emphysema.   -c/n Pirfenidone . - CTA chest negative for PE, chronic postradiation changes and scarring in the right lung.  Nodular density right lower upper lobe-nodular scarring, recurrent disease not excluded. 03/30/24 -Required high flow nasal cannula overnight -c/n IV Solu-Medrol  (changed to 1 mg per kg q 12 h on 03/29/24) -Resume PTA Hycodan -Bronchiodilators as needed  -Dyspnea on exertion is not worse today -- - 2) acute on chronic Respiratory Failure with Hypoxia-- On 4 L at baseline, - Currently [place] 5L/min (baseline usually 4L/min), desaturates with minimal activity and has to increase oxygen  to 8 L with minimal activity 03/30/24 -- Tolerated ambulation around the room today she did desaturate and required higher flow oxygen  temporarily  3)Stage II squamous cell carcinoma of right lung  Follows with Dr. Liam Redhead.  Unresectable stage IIb non-small cell lung cancer diagnosed March 2023.  Status post chemoradiation therapy. - Follow-up with oncology for CT findings  4)Generalized anxiety disorder C/n Cymbalta  and  Gabapentin  for nerve pain.  5)COPD/Emphysema--- c/n solumedrol and azithromycin  -Bronchiodilators as needed - Respiratory status and oxygen  requirement as outlined above #1 and #2  Status is: Inpatient   Disposition: The patient is from: Home              Anticipated d/c is to: Home              Anticipated d/c date is: 2 days              Patient currently is not medically stable to d/c. Barriers: Not Clinically Stable-   Code Status :  -  Code Status: Limited: Do not attempt resuscitation (DNR) -DNR-LIMITED -Do Not Intubate/DNI    Family Communication:     (patient is alert, awake and coherent)  -Discussed with Husband  DVT Prophylaxis  :   - SCDs  rivaroxaban  (XARELTO ) tablet 10 mg Start: 03/27/24 1830 Place and maintain sequential compression device Start: 03/27/24 1754 rivaroxaban  (XARELTO ) tablet 10 mg   Lab Results  Component Value Date   PLT 286 03/27/2024   Inpatient Medications  Scheduled Meds:  azithromycin   500 mg Oral Daily   dextromethorphan-guaiFENesin   1 tablet Oral BID   DULoxetine   30 mg Oral Daily   gabapentin   200 mg Oral TID   ipratropium-albuterol   3 mL Nebulization Q6H WA   loratadine   10 mg Oral Daily   methylPREDNISolone  (SOLU-MEDROL ) injection  60 mg Intravenous Q12H   pantoprazole   40 mg Oral Daily   Pirfenidone   534 mg Oral TID WC   rivaroxaban   10 mg Oral Daily   rosuvastatin   10 mg Oral QHS   traZODone  100 mg Oral QHS  Continuous Infusions: PRN Meds:.acetaminophen  **OR** acetaminophen , albuterol , HYDROcodone  bit-homatropine, ondansetron  **OR** ondansetron  (ZOFRAN ) IV, polyethylene glycol   Anti-infectives (From admission, onward)    Start     Dose/Rate Route Frequency Ordered Stop   03/28/24 1000  azithromycin  (ZITHROMAX ) tablet 500 mg        500 mg Oral Daily 03/28/24 0818 04/02/24 0959       Subjective: Judeen Nose Rung today has no fevers, no emesis,  No chest pain,   - - Tolerated ambulation around the room today she  did desaturate and required higher flow oxygen  temporarily --Dyspnea on exertion is not worse -- Currently back to 5 L of oxygen  at rest --  Objective: Vitals:   03/29/24 1431 03/29/24 1929 03/29/24 1957 03/30/24 0514  BP:  127/65  132/63  Pulse:  96  84  Resp:      Temp:  98.6 F (37 C)  98 F (36.7 C)  TempSrc:  Oral  Oral  SpO2: 91% 95% 92% 98%  Weight:      Height:        Intake/Output Summary (Last 24 hours) at 03/30/2024 1050 Last data filed at 03/29/2024 1754 Gross per 24 hour  Intake 240 ml  Output --  Net 240 ml   Filed Weights   03/27/24 1317  Weight: 62.1 kg    Physical Exam Gen:- Awake Alert,  in no apparent distress, dyspnea on exertion is not worse HEENT:- [place].AT, No sclera icterus Nose- [place] 5L/min (baseline usually 4L/min), desaturates with minimal activity and has to increase oxygen  to 8 L with minimal activity Neck-Supple Neck,No JVD,.  Lungs- Velcro type rales, no wheezing, Fair air movement  CV- S1, S2 normal, regular  Abd-  +ve B.Sounds, Abd Soft, No tenderness,    Extremity/Skin:- No  edema, pedal pulses present  Psych-affect is appropriate, oriented x3 Neuro-generalized weakness, no new focal deficits, no tremors  Data Reviewed: I have personally reviewed following labs and imaging studies  CBC: Recent Labs  Lab 03/27/24 1345  WBC 6.2  NEUTROABS 4.5  HGB 10.0*  HCT 31.2*  MCV 96.6  PLT 286   Basic Metabolic Panel: Recent Labs  Lab 03/27/24 1345  NA 135  K 3.9  CL 102  CO2 23  GLUCOSE 118*  BUN 15  CREATININE 0.57  CALCIUM  9.3   GFR: Estimated Creatinine Clearance: 64.8 mL/min (by C-G formula based on SCr of 0.57 mg/dL). Liver Function Tests: Recent Labs  Lab 03/27/24 1345  AST 40  ALT 57*  ALKPHOS 109  BILITOT 0.4  PROT 8.0  ALBUMIN 3.4*   Recent Results (from the past 240 hours)  Resp panel by RT-PCR (RSV, Flu A&B, Covid) Anterior Nasal Swab     Status: None   Collection Time: 03/27/24  1:23 PM   Specimen:  Anterior Nasal Swab  Result Value Ref Range Status   SARS Coronavirus 2 by RT PCR NEGATIVE NEGATIVE Final    Comment: (NOTE) SARS-CoV-2 target nucleic acids are NOT DETECTED.  The SARS-CoV-2 RNA is generally detectable in upper respiratory specimens during the acute phase of infection. The lowest concentration of SARS-CoV-2 viral copies this assay can detect is 138 copies/mL. A negative result does not preclude SARS-Cov-2 infection and should not be used as the sole basis for treatment or other patient management decisions. A negative result may occur with  improper specimen collection/handling, submission of specimen other than nasopharyngeal swab, presence of viral mutation(s) within the areas targeted by this assay, and inadequate number of viral  copies(<138 copies/mL). A negative result must be combined with clinical observations, patient history, and epidemiological information. The expected result is Negative.  Fact Sheet for Patients:  BloggerCourse.com  Fact Sheet for Healthcare Providers:  SeriousBroker.it  This test is no t yet approved or cleared by the [place]  FDA and  has been authorized for detection and/or diagnosis of SARS-CoV-2 by FDA under an Emergency Use Authorization (EUA). This EUA will remain  in effect (meaning this test can be used) for the duration of the COVID-19 declaration under Section 564(b)(1) of the Act, 21 U.S.C.section 360bbb-3(b)(1), unless the authorization is terminated  or revoked sooner.       Influenza A by PCR NEGATIVE NEGATIVE Final   Influenza B by PCR NEGATIVE NEGATIVE Final    Comment: (NOTE) The Xpert Xpress SARS-CoV-2/FLU/RSV plus assay is intended as an aid in the diagnosis of influenza from Nasopharyngeal swab specimens and should not be used as a sole basis for treatment. Nasal washings and aspirates are unacceptable for Xpert Xpress SARS-CoV-2/FLU/RSV testing.  Fact  Sheet for Patients: BloggerCourse.com  Fact Sheet for Healthcare Providers: SeriousBroker.it  This test is not yet approved or cleared by the [place]  FDA and has been authorized for detection and/or diagnosis of SARS-CoV-2 by FDA under an Emergency Use Authorization (EUA). This EUA will remain in effect (meaning this test can be used) for the duration of the COVID-19 declaration under Section 564(b)(1) of the Act, 21 U.S.C. section 360bbb-3(b)(1), unless the authorization is terminated or revoked.     Resp Syncytial Virus by PCR NEGATIVE NEGATIVE Final    Comment: (NOTE) Fact Sheet for Patients: BloggerCourse.com  Fact Sheet for Healthcare Providers: SeriousBroker.it  This test is not yet approved or cleared by the [place]  FDA and has been authorized for detection and/or diagnosis of SARS-CoV-2 by FDA under an Emergency Use Authorization (EUA). This EUA will remain in effect (meaning this test can be used) for the duration of the COVID-19 declaration under Section 564(b)(1) of the Act, 21 U.S.C. section 360bbb-3(b)(1), unless the authorization is terminated or revoked.  Performed at [facility], [address], [place], [place] [address]   Culture, blood (routine x 2)     Status: None (Preliminary result)   Collection Time: 03/27/24  1:45 PM   Specimen: BLOOD  Result Value Ref Range Status   Specimen Description BLOOD BLOOD RIGHT FOREARM  Final   Special Requests   Final    BOTTLES DRAWN AEROBIC AND ANAEROBIC Blood Culture results may not be optimal due to an inadequate volume of blood received in culture bottles   Culture   Final    NO GROWTH 2 DAYS Performed at [facility], [address], [place], [place] [address]    Report Status PENDING  Incomplete  Culture, blood (routine x 2)     Status: None (Preliminary result)   Collection Time: 03/27/24  1:45 PM    Specimen: BLOOD  Result Value Ref Range Status   Specimen Description BLOOD LEFT ANTECUBITAL  Final   Special Requests   Final    BOTTLES DRAWN AEROBIC AND ANAEROBIC Blood Culture adequate volume   Culture   Final    NO GROWTH 2 DAYS Performed at [facility], [address] [place], [place] [address]    Report Status PENDING  Incomplete    Scheduled Meds:  azithromycin   500 mg Oral Daily   dextromethorphan-guaiFENesin   1 tablet Oral BID   DULoxetine   30 mg Oral Daily   gabapentin   200 mg Oral TID   ipratropium-albuterol   3 mL Nebulization Q6H WA   loratadine   10 mg Oral Daily   methylPREDNISolone  (SOLU-MEDROL ) injection  60 mg Intravenous Q12H   pantoprazole   40 mg Oral Daily   Pirfenidone   534 mg Oral TID WC   rivaroxaban   10 mg Oral Daily   rosuvastatin   10 mg Oral QHS   traZODone  100 mg Oral QHS   Continuous Infusions:   LOS: 2 days   Colin Dawley M.D on 03/30/2024 at 10:50 AM  Go to [email or web address] - for contact info  [facility] - Office  [phone]  If 7PM-7AM, please contact night-coverage [email or web address] 03/30/2024, 10:50 AM

## 2024-03-30 NOTE — Plan of Care (Signed)
  Problem: Clinical Measurements: Goal: Diagnostic test results will improve Outcome: Progressing   Problem: Clinical Measurements: Goal: Respiratory complications will improve Outcome: Progressing   Problem: Nutrition: Goal: Adequate nutrition will be maintained Outcome: Progressing   Problem: Activity: Goal: Risk for activity intolerance will decrease Outcome: Progressing

## 2024-03-30 NOTE — Plan of Care (Signed)
  Problem: Pain Managment: Goal: General experience of comfort will improve and/or be controlled Outcome: Progressing   Problem: Safety: Goal: Ability to remain free from injury will improve Outcome: Progressing

## 2024-03-31 ENCOUNTER — Encounter (HOSPITAL_COMMUNITY): Payer: Self-pay | Admitting: Family Medicine

## 2024-03-31 DIAGNOSIS — Z515 Encounter for palliative care: Secondary | ICD-10-CM

## 2024-03-31 DIAGNOSIS — J84112 Idiopathic pulmonary fibrosis: Secondary | ICD-10-CM | POA: Diagnosis not present

## 2024-03-31 DIAGNOSIS — Z7189 Other specified counseling: Secondary | ICD-10-CM

## 2024-03-31 LAB — GLUCOSE, CAPILLARY
Glucose-Capillary: 110 mg/dL — ABNORMAL HIGH (ref 70–99)
Glucose-Capillary: 120 mg/dL — ABNORMAL HIGH (ref 70–99)
Glucose-Capillary: 164 mg/dL — ABNORMAL HIGH (ref 70–99)
Glucose-Capillary: 136 mg/dL — ABNORMAL HIGH (ref 70–99)

## 2024-03-31 NOTE — Progress Notes (Signed)
 Mobility Specialist Progress Note:    03/31/24 1048  Mobility  Activity Ambulated with assistance in hallway  Level of Assistance Modified independent, requires aide device or extra time  Assistive Device None  Distance Ambulated (ft) 150 ft  Range of Motion/Exercises Active;All extremities  Activity Response Tolerated well  Mobility Referral Yes  Mobility visit 1 Mobility  Mobility Specialist Start Time (ACUTE ONLY) 1000  Mobility Specialist Stop Time (ACUTE ONLY) 1023  Mobility Specialist Time Calculation (min) (ACUTE ONLY) 23 min   Pt received in chair requesting mobility. ModI to stand and ambulate with no AD. Tolerated well, SpO2 95% on 6L at rest. SpO2 desat 82% on 6L during ambulation requiring 8L to return sats up to 90%. Returned to room, SpO2 92% on 6L after ambulation. All needs met.   Glinda Lapping Mobility Specialist Please contact via Special educational needs teacher or  Rehab office at [phone]

## 2024-03-31 NOTE — Consult Note (Signed)
 Consultation Note Date: 03/31/2024   Patient Name: Diane [name]  DOB: 1955-11-09  MRN: [id number]  Age / Sex: 68 y.o., female  PCP: [name], MD Referring Physician: Colin Dawley, MD  Reason for Consultation: Establishing goals of care  HPI/Patient Profile: 68 y.o. female  with past medical history of idiopathic pulmonary fibrosis, COPD/Emphysena, anxiety, lung cancer admitted on 03/27/2024 with idiopathic pulmonary fibrosis flare.   Clinical Assessment and Goals of Care: I have reviewed medical records including EPIC notes, labs and imaging, received report from RN, assessed the patient.  Diane [name], Diane, is sitting up in bed.  She appears acutely ill and a little frail.  She greets me, making and mostly keeping eye contact.  She is alert and oriented, able to make her needs known.  As we are visiting her husband, Alvia Awkward, arrives.  We meet at the bedside to discuss diagnosis prognosis, GOC, EOL wishes, disposition and options. I introduced Palliative Medicine as specialized medical care for people living with serious illness. It focuses on providing relief from the symptoms and stress of a serious illness. The goal is to improve quality of life for both the patient and the family.  We discussed a brief life review of the patient.  Licet is married to Diane [name].  They live in [place].  She retired from [employer] a around 2 years ago.  She has a trusted pulmonologist, Dr. Bertrum Brodie, and a trusted PCP.  We focused on their current illness.  We talked about her pulmonary fibrosis and the treatment plan.  She tells me that she had been on steroids prior to this illness.  We talked about disposition plan.  The natural disease trajectory and expectations at EOL were discussed.  I attempted to elicit values and goals of care important to the patient.  The difference between  aggressive medical intervention and comfort care was considered in light of the patient's goals of care.  We talked about preferred place of death.  Bynum Cassis states that she would not want to die in their home.  She talks about her concern for her husband.  Advanced directives, concepts specific to code status, artifical feeding and hydration, and rehospitalization were considered and discussed.  Judeen Nose independently notes that she is DNR.  Hospice and Palliative Care services outpatient were explained and offered.  We talked in detail about hospice and palliative services.  We talk in detail about provider choice.  We talked about the benefits of taking hospice care early for treat the treatable hospice care.  Diane [name] and her husband state that they would choose [facility] for services.  I encouraged them to think about, pray about their choice between palliative and hospice services.  Discussed the importance of continued conversation with family and the medical providers regarding overall plan of care and treatment options, ensuring decisions are within the context of the patient's values and GOCs. Questions and concerns were addressed.  Hard Choices booklet left for review. The family was encouraged to call with questions or concerns.  PMT will continue to support holistically.  Conference with attending, bedside nursing staff, transition of care team related to patient condition, needs, goals of care, disposition   HCPOA  HCPOA -Tamesha names her spouse, Alvia Awkward, is her healthcare surrogate.  She states that she has completed legal paperwork.    SUMMARY OF RECOMMENDATIONS   Continue to treat the treatable but no CPR or intubation Home with [facility] services, considering palliative versus hospice care. We discussed the concept of do not rehospitalize Preferred place of death would not be at home, we discussed residential hospice  Code Status/Advance Care Planning: DNR - verified with pt  and spouse  Symptom Management:  Per hospitalist, no additional needs at this time.  Palliative Prophylaxis:  Frequent Pain Assessment and Oral Care  Additional Recommendations (Limitations, Scope, Preferences): Continue to treat but no CPR or intubation  Psycho-social/Spiritual:  Desire for further Chaplaincy support:no Additional Recommendations: Caregiving  Support/Resources and Education on Hospice  Prognosis:  < 6 months, would not be surprising based on chronic illness burden.  Discharge Planning: Anticipate home with [facility] services, choosing hospice versus palliative      Primary Diagnoses: Present on Admission:  Idiopathic pulmonary fibrosis (HCC)  Stage II squamous cell carcinoma of right lung (HCC)  Generalized anxiety disorder  Chronic respiratory failure with hypoxia (HCC)  COPD with acute exacerbation (HCC)   I have reviewed the medical record, interviewed the patient and family, and examined the patient. The following aspects are pertinent.  Past Medical History:  Diagnosis Date   Anxiety    Arthritis    fingers, left foot   C. difficile diarrhea 2015   COPD (chronic obstructive pulmonary disease) (HCC)    Depression    Eczema    H pylori ulcer    Headache(784.0)    Hypercholesterolemia    Hypertension    Interstitial lung disease (HCC)    Kidney stones    20 years ago   lung ca with recurrence 10/2021   01/2023   Pneumonia    5 years ago   Seizures (HCC)    2 years ago, cluster of seizures none since   Shingles    Social History   Socioeconomic History   Marital status: Married    Spouse name: Alvia Awkward    Number of children: 3   Years of education: 12+   Highest education level: Associate degree: academic program  Occupational History    Employer: Advertising copywriter  Tobacco Use   Smoking status: Former    Current packs/day: 0.00    Average packs/day: 0.5 packs/day for 30.0 years (15.0 ttl pk-yrs)    Types: Cigarettes    Start  date: 10/10/1991    Quit date: 10/09/2021    Years since quitting: 2.4   Smokeless tobacco: Never   Tobacco comments:    5-10 cigarettes a day (plans to quit prior to this surgery 05/06/15)  Vaping Use   Vaping status: Some Days  Substance and Sexual Activity   Alcohol use: Yes    Alcohol/week: 3.0 standard drinks of alcohol    Types: 3 Shots of liquor per week    Comment: 2x week   Drug use: No   Sexual activity: Not on file  Other Topics Concern   Not on file  Social History Narrative   The patient lives at home with husband Alvia Awkward. Patient has 3 children. Patient has he Associates Degree. Patient is currently working.       Likes to [employer],  spend time with family, travel   Social Drivers of Corporate investment banker Strain: Not on file  Food Insecurity: No Food Insecurity (03/27/2024)   Hunger Vital Sign    Worried About Running Out of Food in the Last Year: Never true    Ran Out of Food in the Last Year: Never true  Transportation Needs: No Transportation Needs (03/27/2024)   PRAPARE - Administrator, Civil Service (Medical): No    Lack of Transportation (Non-Medical): No  Physical Activity: Not on file  Stress: Not on file  Social Connections: Moderately Isolated (03/27/2024)   Social Connection and Isolation Panel    Frequency of Communication with Friends and Family: More than three times a week    Frequency of Social Gatherings with Friends and Family: Never    Attends Religious Services: Never    Database administrator or Organizations: No    Attends Engineer, structural: Never    Marital Status: Married   Family History  Problem Relation Age of Onset   Arthritis Mother    Asthma Mother    Depression Mother    Hyperlipidemia Mother    Varicose Veins Mother    Arthritis Father    Heart disease Father    Hyperlipidemia Father    Vision loss Father    Cancer Sister    COPD Sister    Early death Sister    Cancer Brother    Alcohol  abuse Maternal Grandfather    Colon cancer Neg Hx    Scheduled Meds:  azithromycin   500 mg Oral Daily   dextromethorphan-guaiFENesin   1 tablet Oral BID   DULoxetine   30 mg Oral Daily   gabapentin   200 mg Oral TID   insulin aspart  0-5 Units Subcutaneous QHS   insulin aspart  0-8 Units Subcutaneous TID WC   ipratropium-albuterol   3 mL Nebulization Q6H WA   loratadine   10 mg Oral Daily   methylPREDNISolone  (SOLU-MEDROL ) injection  60 mg Intravenous Q12H   pantoprazole   40 mg Oral Daily   Pirfenidone   534 mg Oral TID WC   rivaroxaban   10 mg Oral Daily   rosuvastatin   10 mg Oral QHS   traZODone  100 mg Oral QHS   Continuous Infusions: PRN Meds:.acetaminophen  **OR** acetaminophen , albuterol , HYDROcodone  bit-homatropine, ondansetron  **OR** ondansetron  (ZOFRAN ) IV, polyethylene glycol Medications Prior to Admission:  Prior to Admission medications   Medication Sig Start Date End Date Taking? Authorizing Provider  acetaminophen  (TYLENOL ) 500 MG tablet Take 1,500 mg by mouth every 4 (four) hours as needed for mild pain (pain score 1-3).   Yes [provider]  DULoxetine  (CYMBALTA ) 30 MG capsule Take 30 mg by mouth daily. 03/06/24  Yes [provider]  gabapentin  (NEURONTIN ) 100 MG capsule Take 200 mg by mouth 3 (three) times daily. 03/06/24  Yes [provider]  HYDROcodone  bit-homatropine (HYCODAN) 5-1.5 MG/5ML syrup Take 5 mLs by mouth every 6 (six) hours as needed for cough. 03/19/24  Yes Maire Scot, MD  lidocaine  (LIDODERM ) 5 % Place 1 patch onto the skin daily. Remove & Discard patch within 12 hours or as directed by MD 02/15/24  Yes Kelsey Patricia, MD  loratadine  (CLARITIN ) 10 MG tablet Take 10 mg by mouth daily.   Yes [provider]  omeprazole  (PRILOSEC) 20 MG capsule Take 1 capsule (20 mg total) by mouth 2 (two) times daily. (Needs to be seen before next refill) Patient taking differently: Take 20 mg by mouth  at bedtime. (Needs to be seen before  next refill) 11/15/20  Yes Zorita Hiss, FNP  Pirfenidone  (ESBRIET ) 267 MG TABS Take 3 tablets (801 mg total) by mouth 3 (three) times daily with meals. (Account: [email or web address]) Patient taking differently: Take 534 mg by mouth 3 (three) times daily with meals. (Account: [email or web address]) 11/14/23  Yes Maire Scot, MD  Polyethyl Glycol-Propyl Glycol 0.4-0.3 % SOLN Place 1 drop into both eyes in the morning and at bedtime.   Yes [provider]  Probiotic Product (PROBIOTIC DAILY PO) Take 1 capsule by mouth daily.   Yes [provider]  rosuvastatin  (CRESTOR ) 10 MG tablet Take 10 mg by mouth at bedtime. 12/05/21  Yes [provider]  saline (AYR) GEL Place 1 Application into both nostrils in the morning and at bedtime.   Yes [provider]   Allergies  Allergen Reactions   Penicillins Shortness Of Breath   Alpha-Gal Other (See Comments)    Abdominal Pain All four legged mammals; pork, beef, lamb, deer, etc.   Review of Systems  Unable to perform ROS: Other    Physical Exam Vitals and nursing note reviewed.  Constitutional:      General: She is not in acute distress.    Appearance: She is well-developed.   Cardiovascular:     Rate and Rhythm: Normal rate.   Skin:    General: Skin is warm and dry.   Neurological:     Mental Status: She is alert and oriented to person, place, and time.   Psychiatric:        Mood and Affect: Mood normal.        Behavior: Behavior normal.     Vital Signs: BP 113/62 (BP Location: Left Arm)   Pulse 79   Temp 97.6 F (36.4 C) (Oral)   Resp 17   Ht 5' 6 (1.676 m)   Wt 62.1 kg   SpO2 98%   BMI 22.11 kg/m  Pain Scale: 0-10   Pain Score: 3    SpO2: SpO2: 98 % O2 Device:SpO2: 98 % O2 Flow Rate: .O2 Flow Rate (L/min): 5 L/min  IO: Intake/output summary: No intake or output data in the 24 hours ending 03/31/24 0834  LBM: Last BM Date : 03/30/24 Baseline Weight:  Weight: 62.1 kg Most recent weight: Weight: 62.1 kg     Palliative Assessment/Data:     Time In: 0830   Time Out: 0915 Time Total: 75 minutes  Greater than 50%  of this time was spent counseling and coordinating care related to the above assessment and plan.  Signed by: Annabelle Barrack, NP   Please contact Palliative Medicine Team phone at [phone] for questions and concerns.  For individual provider: See Tilford Foley

## 2024-03-31 NOTE — Progress Notes (Signed)
 Mobility Specialist Progress Note:    03/31/24 1451  Mobility  Activity Ambulated with assistance in hallway  Level of Assistance Modified independent, requires aide device or extra time  Assistive Device None  Distance Ambulated (ft) 150 ft  Range of Motion/Exercises Active;All extremities  Activity Response Tolerated well  Mobility Referral Yes  Mobility visit 1 Mobility  Mobility Specialist Start Time (ACUTE ONLY) 1425  Mobility Specialist Stop Time (ACUTE ONLY) 1451  Mobility Specialist Time Calculation (min) (ACUTE ONLY) 26 min   Pt received in bed, eager for mobility. ModI to stand and ambulate with no AD. Tolerated well, see O2 sats below. Returned to room, all needs met.   SpO2 96% on 5L at rest SpO2 83-93% on 6L during ambulation, rest breaks and deep breathing quickly recovers O2 sats SpO2 92% on 6L after ambulation  Glinda Lapping Mobility Specialist Please contact via SecureChat or  Rehab office at [phone]

## 2024-03-31 NOTE — Progress Notes (Signed)
 Walked in hallway today and O2 had to be increased to 8 liters to keep O2 above 92%

## 2024-03-31 NOTE — Plan of Care (Signed)

## 2024-03-31 NOTE — Progress Notes (Signed)
 PROGRESS NOTE  Diane [name], is a 68 y.o. female, DOB - 1956/05/30, ZOX:[id number]  Admit date - 03/27/2024   Admitting Physician Diane Alcindor Quintella Buck, MD  Outpatient Primary MD for the patient is Diane Bitters, MD  LOS - 3  Chief Complaint  Patient presents with   Shortness of Breath      Brief Narrative:   68 y.o. female with medical history significant for idiopathic pulmonary fibrosis, COPD/Emphysena, anxiety, lung cancer--admitted on 03/27/24 with Presumed IPF/COPD Flare up after discontinuing her steroid taper abruptly.     -Assessment and Plan: 1)Idiopathic Pulmonary Fibrosis Flare--- she was on 4 week Prednisone  taper until she abruptly stopped Prednsone on 03/23/24 and then she developed worsening resp symptoms.  -Presented with dyspnea with activity, and reported desaturations. -Discussed with  Dr. Bertrum [name] for IPF, with radiation fibrosis and pulmonary emphysema.   -c/n Pirfenidone . - CTA chest negative for PE, chronic postradiation changes and scarring in the right lung.  Nodular density right lower upper lobe-nodular scarring, recurrent disease not excluded. 03/31/24 -Required high flow nasal cannula overnight -c/n IV Solu-Medrol  (changed to 1 mg per kg q 12 h on 03/29/24) -Resume PTA Hycodan -c/n Bronchiodilators as needed  SpO2 95% on 6L at rest. SpO2 desat 82% on 6L during ambulation requiring 8L to return sats up to 90%. Returned to room, SpO2 92% on 6L after ambulation.  --Continues to have dyspnea on exertion and desaturation with activity --Please see separate documentation from mobility specialist -- 2) acute on chronic Respiratory Failure with Hypoxia-- On 4 L at baseline, - Currently [place] 5L/min (baseline usually 4L/min), desaturates with minimal activity and has to increase oxygen  to 8 L with minimal activity 03/31/24 --- Dyspnea, hypoxia and desaturation with activity as above #1  3)Stage II squamous cell carcinoma of right lung  Follows with Dr. Liam [name].   Unresectable stage IIb non-small cell lung cancer diagnosed March 2023.  Status post chemoradiation therapy. - Follow-up with oncology (Dr. Randall [name])  for CT findings  4)Generalized anxiety disorder C/n Cymbalta  and Gabapentin  for anxiety and chronic postradiation neuropathy  5)COPD/Emphysema--- c/n solumedrol and azithromycin  -Bronchiodilators as needed - Respiratory status and oxygen  requirement as outlined above #1 and #2  6)Social/Ethics--- palliative care consult appreciated  Status is: Inpatient   Disposition: The patient is from: Home              Anticipated d/c is to: Home              Anticipated d/c date is: 2 days              Patient currently is not medically stable to d/c. Barriers: Not Clinically Stable-   Code Status :  -  Code Status: Limited: Do not attempt resuscitation (DNR) -DNR-LIMITED -Do Not Intubate/DNI    Family Communication:     (patient is alert, awake and coherent)  -Discussed with Husband  DVT Prophylaxis  :   - SCDs  rivaroxaban  (XARELTO ) tablet 10 mg Start: 03/27/24 1830 Place and maintain sequential compression device Start: 03/27/24 1754 rivaroxaban  (XARELTO ) tablet 10 mg   Lab Results  Component Value Date   PLT 286 03/27/2024   Inpatient Medications  Scheduled Meds:  azithromycin   500 mg Oral Daily   dextromethorphan-guaiFENesin   1 tablet Oral BID   DULoxetine   30 mg Oral Daily   gabapentin   200 mg Oral TID   insulin aspart  0-5 Units Subcutaneous QHS   insulin aspart  0-8 Units Subcutaneous TID WC  ipratropium-albuterol   3 mL Nebulization Q6H WA   loratadine   10 mg Oral Daily   methylPREDNISolone  (SOLU-MEDROL ) injection  60 mg Intravenous Q12H   pantoprazole   40 mg Oral Daily   Pirfenidone   534 mg Oral TID WC   rivaroxaban   10 mg Oral Daily   rosuvastatin   10 mg Oral QHS   traZODone  100 mg Oral QHS   Continuous Infusions: PRN Meds:.acetaminophen  **OR** acetaminophen , albuterol , HYDROcodone  bit-homatropine,  ondansetron  **OR** ondansetron  (ZOFRAN ) IV, polyethylene glycol   Anti-infectives (From admission, onward)    Start     Dose/Rate Route Frequency Ordered Stop   03/28/24 1000  azithromycin  (ZITHROMAX ) tablet 500 mg        500 mg Oral Daily 03/28/24 0818 04/02/24 0959       Subjective: Diane [name] today has no fevers, no emesis,  No chest pain,   - SpO2 95% on 6L at rest. SpO2 desat 82% on 6L during ambulation requiring 8L to return sats up to 90%. Returned to room, SpO2 92% on 6L after ambulation.  --Continues to have dyspnea on exertion and desaturation with activity --Please see separate documentation from mobility specialist  Objective: Vitals:   03/31/24 0700 03/31/24 0736 03/31/24 1213 03/31/24 1355  BP: 113/62  127/74   Pulse: 79  86   Resp: 17  17   Temp: 97.6 F (36.4 C)  98 F (36.7 C)   TempSrc: Oral  Oral   SpO2: 96% 98% 100% 95%  Weight:      Height:       No intake or output data in the 24 hours ending 03/31/24 1552  Filed Weights   03/27/24 1317  Weight: 62.1 kg    Physical Exam Gen:- Awake Alert,  in no apparent distress, dyspnea on exertion is not worse HEENT:- [place].AT, No sclera icterus Nose- [place] 5L/min (baseline usually 4L/min), desaturates with minimal activity and has to increase oxygen  to 8 L with minimal activity Neck-Supple Neck,No JVD,.  Lungs- Velcro type rales, no wheezing, Fair air movement  CV- S1, S2 normal, regular  Abd-  +ve B.Sounds, Abd Soft, No tenderness,    Extremity/Skin:- No  edema, pedal pulses present  Psych-affect is appropriate, oriented x3 Neuro-generalized weakness, no new focal deficits, no tremors  Data Reviewed: I have personally reviewed following labs and imaging studies  CBC: Recent Labs  Lab 03/27/24 1345  WBC 6.2  NEUTROABS 4.5  HGB 10.0*  HCT 31.2*  MCV 96.6  PLT 286   Basic Metabolic Panel: Recent Labs  Lab 03/27/24 1345  NA 135  K 3.9  CL 102  CO2 23  GLUCOSE 118*  BUN 15  CREATININE  0.57  CALCIUM  9.3   GFR: Estimated Creatinine Clearance: 64.8 mL/min (by C-G formula based on SCr of 0.57 mg/dL). Liver Function Tests: Recent Labs  Lab 03/27/24 1345  AST 40  ALT 57*  ALKPHOS 109  BILITOT 0.4  PROT 8.0  ALBUMIN 3.4*   Recent Results (from the past 240 hours)  Resp panel by RT-PCR (RSV, Flu A&B, Covid) Anterior Nasal Swab     Status: None   Collection Time: 03/27/24  1:23 PM   Specimen: Anterior Nasal Swab  Result Value Ref Range Status   SARS Coronavirus 2 by RT PCR NEGATIVE NEGATIVE Final    Comment: (NOTE) SARS-CoV-2 target nucleic acids are NOT DETECTED.  The SARS-CoV-2 RNA is generally detectable in upper respiratory specimens during the acute phase of infection. The lowest concentration of SARS-CoV-2 viral copies  this assay can detect is 138 copies/mL. A negative result does not preclude SARS-Cov-2 infection and should not be used as the sole basis for treatment or other patient management decisions. A negative result may occur with  improper specimen collection/handling, submission of specimen other than nasopharyngeal swab, presence of viral mutation(s) within the areas targeted by this assay, and inadequate number of viral copies(<138 copies/mL). A negative result must be combined with clinical observations, patient history, and epidemiological information. The expected result is Negative.  Fact Sheet for Patients:  BloggerCourse.com  Fact Sheet for Healthcare Providers:  SeriousBroker.it  This test is no t yet approved or cleared by the [place]  FDA and  has been authorized for detection and/or diagnosis of SARS-CoV-2 by FDA under an Emergency Use Authorization (EUA). This EUA will remain  in effect (meaning this test can be used) for the duration of the COVID-19 declaration under Section 564(b)(1) of the Act, 21 U.S.C.section 360bbb-3(b)(1), unless the authorization is terminated  or  revoked sooner.       Influenza A by PCR NEGATIVE NEGATIVE Final   Influenza B by PCR NEGATIVE NEGATIVE Final    Comment: (NOTE) The Xpert Xpress SARS-CoV-2/FLU/RSV plus assay is intended as an aid in the diagnosis of influenza from Nasopharyngeal swab specimens and should not be used as a sole basis for treatment. Nasal washings and aspirates are unacceptable for Xpert Xpress SARS-CoV-2/FLU/RSV testing.  Fact Sheet for Patients: BloggerCourse.com  Fact Sheet for Healthcare Providers: SeriousBroker.it  This test is not yet approved or cleared by the [place]  FDA and has been authorized for detection and/or diagnosis of SARS-CoV-2 by FDA under an Emergency Use Authorization (EUA). This EUA will remain in effect (meaning this test can be used) for the duration of the COVID-19 declaration under Section 564(b)(1) of the Act, 21 U.S.C. section 360bbb-3(b)(1), unless the authorization is terminated or revoked.     Resp Syncytial Virus by PCR NEGATIVE NEGATIVE Final    Comment: (NOTE) Fact Sheet for Patients: BloggerCourse.com  Fact Sheet for Healthcare Providers: SeriousBroker.it  This test is not yet approved or cleared by the [place]  FDA and has been authorized for detection and/or diagnosis of SARS-CoV-2 by FDA under an Emergency Use Authorization (EUA). This EUA will remain in effect (meaning this test can be used) for the duration of the COVID-19 declaration under Section 564(b)(1) of the Act, 21 U.S.C. section 360bbb-3(b)(1), unless the authorization is terminated or revoked.  Performed at [facility], [address], [place], [place] [address]   Culture, blood (routine x 2)     Status: None (Preliminary result)   Collection Time: 03/27/24  1:45 PM   Specimen: BLOOD  Result Value Ref Range Status   Specimen Description BLOOD BLOOD RIGHT FOREARM  Final    Special Requests   Final    BOTTLES DRAWN AEROBIC AND ANAEROBIC Blood Culture results may not be optimal due to an inadequate volume of blood received in culture bottles   Culture   Final    NO GROWTH 4 DAYS Performed at [facility], [address] [place], [place] [address]    Report Status PENDING  Incomplete  Culture, blood (routine x 2)     Status: None (Preliminary result)   Collection Time: 03/27/24  1:45 PM   Specimen: BLOOD  Result Value Ref Range Status   Specimen Description BLOOD LEFT ANTECUBITAL  Final   Special Requests   Final    BOTTLES DRAWN AEROBIC AND ANAEROBIC Blood Culture adequate  volume   Culture   Final    NO GROWTH 4 DAYS Performed at [facility], [address], [place], [place] [address]    Report Status PENDING  Incomplete    Scheduled Meds:  azithromycin   500 mg Oral Daily   dextromethorphan-guaiFENesin   1 tablet Oral BID   DULoxetine   30 mg Oral Daily   gabapentin   200 mg Oral TID   insulin aspart  0-5 Units Subcutaneous QHS   insulin aspart  0-8 Units Subcutaneous TID WC   ipratropium-albuterol   3 mL Nebulization Q6H WA   loratadine   10 mg Oral Daily   methylPREDNISolone  (SOLU-MEDROL ) injection  60 mg Intravenous Q12H   pantoprazole   40 mg Oral Daily   Pirfenidone   534 mg Oral TID WC   rivaroxaban   10 mg Oral Daily   rosuvastatin   10 mg Oral QHS   traZODone  100 mg Oral QHS   Continuous Infusions:   LOS: 3 days   Colin Dawley M.D on 03/31/2024 at 3:52 PM  Go to [email or web address] - for contact info  [facility] - Office  [phone]  If 7PM-7AM, please contact night-coverage [email or web address] 03/31/2024, 3:52 PM

## 2024-04-01 ENCOUNTER — Ambulatory Visit (HOSPITAL_COMMUNITY)

## 2024-04-01 ENCOUNTER — Encounter: Payer: Self-pay | Admitting: Internal Medicine

## 2024-04-01 DIAGNOSIS — Z7189 Other specified counseling: Secondary | ICD-10-CM | POA: Diagnosis not present

## 2024-04-01 DIAGNOSIS — J84112 Idiopathic pulmonary fibrosis: Secondary | ICD-10-CM | POA: Diagnosis not present

## 2024-04-01 DIAGNOSIS — J849 Interstitial pulmonary disease, unspecified: Secondary | ICD-10-CM

## 2024-04-01 DIAGNOSIS — Z515 Encounter for palliative care: Secondary | ICD-10-CM | POA: Diagnosis not present

## 2024-04-01 LAB — CULTURE, BLOOD (ROUTINE X 2)
Culture: NO GROWTH
Culture: NO GROWTH
Special Requests: ADEQUATE

## 2024-04-01 LAB — GLUCOSE, CAPILLARY
Glucose-Capillary: 114 mg/dL — ABNORMAL HIGH (ref 70–99)
Glucose-Capillary: 78 mg/dL (ref 70–99)

## 2024-04-01 MED ORDER — HYDROCODONE BIT-HOMATROP MBR 5-1.5 MG/5ML PO SOLN: 5.0000 mL | Freq: Four times a day (QID) | ORAL | 0 refills | Status: DC | PRN

## 2024-04-01 MED ORDER — TRAZODONE HCL 100 MG PO TABS
100.0000 mg | ORAL_TABLET | Freq: Every day | ORAL | 4 refills | Status: DC
Start: 2024-04-01 — End: 2024-07-13

## 2024-04-01 MED ORDER — ACETAMINOPHEN 325 MG PO TABS: 650.0000 mg | ORAL_TABLET | ORAL | Status: DC | PRN

## 2024-04-01 MED ORDER — COMPRESSOR/NEBULIZER MISC: 1.0000 [IU] | Freq: Every day | 0 refills | Status: DC | PRN

## 2024-04-01 MED ORDER — PREDNISONE 10 MG PO TABS: 10.0000 mg | ORAL_TABLET | ORAL | 1 refills | Status: DC

## 2024-04-01 MED ORDER — ALBUTEROL SULFATE (2.5 MG/3ML) 0.083% IN NEBU: 2.5000 mg | INHALATION_SOLUTION | RESPIRATORY_TRACT | 12 refills | Status: DC | PRN

## 2024-04-01 MED ORDER — GUAIFENESIN ER 600 MG PO TB12: 600.0000 mg | ORAL_TABLET | Freq: Two times a day (BID) | ORAL | 2 refills | Status: DC

## 2024-04-01 NOTE — Care Management Important Message (Signed)
 Important Message  Patient Details  Name: Diane [name] MRN: [id number] Date of Birth: [birth year]   Important Message Given:  Yes - Medicare IM     Subrina Vecchiarelli L Tyre Beaver 04/01/2024, 10:04 AM

## 2024-04-01 NOTE — Plan of Care (Signed)
   Problem: Activity: Goal: Risk for activity intolerance will decrease Outcome: Progressing   Problem: Coping: Goal: Level of anxiety will decrease Outcome: Progressing   Problem: Safety: Goal: Ability to remain free from injury will improve Outcome: Progressing

## 2024-04-01 NOTE — Progress Notes (Signed)
 Palliative: Diane [name], Diane [name], is sitting up in bed.  She greets me, making and mostly keeping eye contact.  She is alert and oriented, able to make her needs known.  Her husband, Alvia Awkward, is present at bedside.  We talked about her acute health concerns and the treatment plan.  We talked about plan for discharge today.  She tells me that she feels she is ready for discharge.  We continue our discussions related to palliative versus hospice care.  [name] shares that she has given this though and would like to go home with [facility] treat the treatable hospice care.  Questions answered.  Conference with attending, bedside nursing staff, transition of care team related to patient condition, needs, goals of care, disposition.  Plan: Continue to treat the treatable but no CPR or intubation. Home with [facility] treat the treatable hospice care.  50 minutes  Bryne Lindon, NP Palliative medicine team Team phone [phone]

## 2024-04-01 NOTE — Discharge Summary (Signed)
 Diane [name], is a 68 y.o. female  DOB Jul 31, 1956  MRN [id number].  Admission date:  03/27/2024  Admitting Physician  Rendall Carwin, MD  Discharge Date:  04/01/2024   Primary MD  [name], MD  Recommendations for primary care physician for things to follow:  1)You need oxygen  at home at 5 L via nasal cannula continuously at rest and while asleep and up to 12 L with activity  2)Take prednisone  60mg  daily x 2 weeks and then  40 mg daily x 2 weeks then prednisone  30mg  daily x 2 weeks, then 20mg  daily x 2 weeks then 10mg  daily x 2 weeks, then 5mg  daily x 2 weeeks and then 5mg  On MWF x 2 weeks and stop 3)Follow-up with your Pulmonologist Dr. Geronimo in late July as previously scheduled 4)Hospice services will continue to help you manage your symptoms after discharge home  Admission Diagnosis  Pulmonary fibrosis (HCC) [J84.10] COPD exacerbation (HCC) [J44.1] Acute respiratory failure with hypoxia (HCC) [J96.01] COPD with acute exacerbation (HCC) [J44.1]   Discharge Diagnosis  Pulmonary fibrosis (HCC) [J84.10] COPD exacerbation (HCC) [J44.1] Acute respiratory failure with hypoxia (HCC) [J96.01] COPD with acute exacerbation (HCC) [J44.1]    Principal Problem:   Idiopathic pulmonary fibrosis (HCC) Active Problems:   Generalized anxiety disorder   Stage II squamous cell carcinoma of right lung (HCC)   Chronic respiratory failure with hypoxia (HCC)   COPD with acute exacerbation (HCC)      Past Medical History:  Diagnosis Date   Anxiety    Arthritis    fingers, left foot   C. difficile diarrhea 2015   COPD (chronic obstructive pulmonary disease) (HCC)    Depression    Eczema    H pylori ulcer    Headache(784.0)    Hypercholesterolemia    Hypertension    Interstitial lung disease (HCC)    Kidney stones    20 years ago   lung ca with recurrence 10/2021   01/2023   Pneumonia    5  years ago   Seizures (HCC)    2 years ago, cluster of seizures none since   Shingles     Past Surgical History:  Procedure Laterality Date   ABDOMINAL HYSTERECTOMY     ADENOIDECTOMY     APPENDECTOMY     BRONCHIAL BIOPSY  12/20/2021   Procedure: BRONCHIAL BIOPSIES;  Surgeon: Brenna Adine CROME, DO;  Location: [facility];  Service: Pulmonary;;   BRONCHIAL BRUSHINGS  12/20/2021   Procedure: BRONCHIAL BRUSHINGS;  Surgeon: Brenna Adine CROME, DO;  Location: [facility];  Service: Pulmonary;;   BRONCHIAL NEEDLE ASPIRATION BIOPSY  12/20/2021   Procedure: BRONCHIAL NEEDLE ASPIRATION BIOPSIES;  Surgeon: Brenna Adine CROME, DO;  Location: [facility];  Service: Pulmonary;;   CESAREAN SECTION     CHOLECYSTECTOMY     COLONOSCOPY  06/10/2012   RMR: Colonic polyps -removed as described above.    FINE NEEDLE ASPIRATION  12/20/2021   Procedure: FINE NEEDLE ASPIRATION (FNA) LINEAR;  Surgeon: Brenna Adine CROME, DO;  Location: [facility];  Service: Pulmonary;;   KNEE ARTHROSCOPY  1973   right knee,  torn cart   LUMBAR DISC SURGERY  05/12/2015   L4 L5   LUMBAR LAMINECTOMY/DECOMPRESSION MICRODISCECTOMY Left 05/12/2015   Procedure: Left L4-5 Microdiscectomy;  Surgeon: Oneil JAYSON Herald, MD;  Location: [facility] OR;  Service: Orthopedics;  Laterality: Left;   TONSILLECTOMY     VIDEO BRONCHOSCOPY WITH ENDOBRONCHIAL ULTRASOUND Bilateral 12/20/2021   Procedure: VIDEO BRONCHOSCOPY WITH ENDOBRONCHIAL ULTRASOUND;  Surgeon: Brenna Adine CROME, DO;  Location: [facility];  Service: Pulmonary;  Laterality: Bilateral;   VIDEO BRONCHOSCOPY WITH RADIAL ENDOBRONCHIAL ULTRASOUND  12/20/2021   Procedure: RADIAL ENDOBRONCHIAL ULTRASOUND;  Surgeon: Brenna Adine CROME, DO;  Location: [facility];  Service: Pulmonary;;       HPI  from the history and physical done on the day of admission:    Chief Complaint: Difficulty breathing   HPI: Diane [name] is a 68 y.o. female with medical history significant for idiopathic pulmonary fibrosis,  COPD, anxiety, lung cancer. Patient presented to the ED with complaints of difficulty breathing that started yesterday.  Symptoms are present only with exertion and resolves with rest.  She reports with exertion her O2 sats dropped down to the 50s while she was on her home 4 L of O2.  No chest pain.  She has a mild chronic cough that is unchanged for which she takes Hycodan at bedtime time. She has been on 2 courses of steroids recently within the past 2 months-first course was for nerve pain involving her right lower flank secondary to radiation, and the second was for lung findings of nodular densities/scarring to her lungs.  She stopped her steroids ~2 days ago-because she felt the dose was too high, she was eating a lot, gaining weight and not sleeping at night.   She is not on chronic steroids. She follows with Dr. Geronimo and is on pirfenidone .   ED Course: O2 sats greater than 95% on home 4 L.  Temperature 97.6.  Heart rate 87-102.  Respirate rate 14-24.  Blood pressure systolic 97-115. Chest x-ray negative for acute abnormality. Subsequent CTA chest-negative for PE,Chronic post radiation changes and scarring in the right lung. A 2.2 x 1.2 cm nodular density in the right upper lobe appears slightly more rounded since the prior CT. Although this may represent a nodular scarring, recurrent disease is not excluded. Solu-Medrol  125 mg given. Hospitalist to admit for IPF/COPD flare.   Review of Systems: As per HPI all other systems reviewed and negative.   Hospital Course:     No notes on file  Brief Narrative:   68 y.o. female with medical history significant for idiopathic pulmonary fibrosis, COPD/Emphysena, anxiety, lung cancer--admitted on 03/27/24 with Presumed IPF/COPD Flare up after discontinuing her steroid taper abruptly.      -Assessment and Plan: 1)Idiopathic Pulmonary Fibrosis Flare--- she was on 4 week Prednisone  taper until she abruptly stopped Prednsone on 03/23/24 and then  she developed worsening resp symptoms.  -Presented with dyspnea with activity, and reported desaturations. -Discussed with  Dr. Geronimo for IPF, with radiation fibrosis and pulmonary emphysema.   -c/n Pirfenidone . - CTA chest negative for PE, chronic postradiation changes and scarring in the right lung.  Nodular density right lower upper lobe-nodular scarring, recurrent disease not excluded. 03/31/24 -Required high flow nasal cannula overnight -c/n IV Solu-Medrol  (changed to 1 mg per kg q 12 h on 03/29/24) -Resume PTA Hycodan -c/n Bronchiodilators as needed  SpO2 95% on 6L at rest. SpO2 desat 82% on  6L during ambulation requiring 8L to return sats up to 90%. Returned to room, SpO2 92% on 6L after ambulation.  --Continues to have dyspnea on exertion and desaturation with activity --Please see separate documentation from mobility specialist -- 2) acute on chronic Respiratory Failure with Hypoxia-- On 4 L at baseline, - Currently [place] 5L/min (baseline usually 4L/min), desaturates with minimal activity and has to increase oxygen  to 8 L with minimal activity 03/31/24 --- Dyspnea, hypoxia and desaturation with activity as above #1   3)Stage II squamous cell carcinoma of right lung  Follows with Dr. Gatha.  Unresectable stage IIb non-small cell lung cancer diagnosed March 2023.  Status post chemoradiation therapy. - Follow-up with oncology (Dr. Gatha Gatha)  for CT findings   4)Generalized anxiety disorder C/n Cymbalta  and Gabapentin  for anxiety and chronic postradiation neuropathy   5)COPD/Emphysema--- c/n solumedrol and azithromycin  -Bronchiodilators as needed - Respiratory status and oxygen  requirement as outlined above #1 and #2   6)Social/Ethics--- palliative care consult appreciated   Disposition: The patient is from: Home              Anticipated d/c is to: Home   Discharge Condition: ***  Follow UP   Follow-up Information     Geronimo Amel, MD. Schedule an  appointment as soon as possible for a visit in 5 week(s).   Specialty: Pulmonary Disease Contact information: [address] [place] [address] [phone]                  Consults obtained - ***  Diet and Activity recommendation:  As advised  Discharge Instructions    **** Discharge Instructions     Call MD for:  difficulty breathing, headache or visual disturbances   Complete by: As directed    Call MD for:  persistant dizziness or light-headedness   Complete by: As directed    Call MD for:  persistant nausea and vomiting   Complete by: As directed    Call MD for:  temperature >100.4   Complete by: As directed    Diet general   Complete by: As directed    Discharge instructions   Complete by: As directed    1)You need oxygen  at home at 5 L via nasal cannula continuously at rest and while asleep and up to 12 L with activity  2)Take prednisone  60mg  daily x 2 weeks and then  40 mg daily x 2 weeks then prednisone  30mg  daily x 2 weeks, then 20mg  daily x 2 weeks then 10mg  daily x 2 weeks, then 5mg  daily x 2 weeeks and then 5mg  On MWF x 2 weeks and stop 3)Follow-up with your Pulmonologist Dr. Geronimo in late July as previously scheduled 4)Hospice services will continue to help you manage your symptoms after discharge home   For home use only DME Nebulizer machine   Complete by: As directed    Patient needs a nebulizer to treat with the following condition: COPD (chronic obstructive pulmonary disease) (HCC)   Length of Need: Lifetime   Additional equipment included:  Administration kit Filter     Increase activity slowly   Complete by: As directed          Discharge Medications     Allergies as of 04/01/2024       Reactions   Penicillins Shortness Of Breath   Alpha-gal Other (See Comments)   Abdominal Pain All four legged mammals; pork, beef, lamb, deer, etc.  Medication List     TAKE these medications    acetaminophen  325 MG  tablet Commonly known as: TYLENOL  Take 2 tablets (650 mg total) by mouth every 4 (four) hours as needed for mild pain (pain score 1-3) or fever (or Fever >/= 101). What changed:  medication strength how much to take reasons to take this   albuterol  (2.5 MG/3ML) 0.083% nebulizer solution Commonly known as: PROVENTIL  Take 3 mLs (2.5 mg total) by nebulization every 2 (two) hours as needed for wheezing or shortness of breath.   Compressor/Nebulizer Misc 1 Units by Does not apply route daily as needed.   DULoxetine  30 MG capsule Commonly known as: CYMBALTA  Take 30 mg by mouth daily.   gabapentin  100 MG capsule Commonly known as: NEURONTIN  Take 200 mg by mouth 3 (three) times daily.   guaiFENesin  600 MG 12 hr tablet Commonly known as: Mucinex  Take 1 tablet (600 mg total) by mouth 2 (two) times daily.   HYDROcodone  bit-homatropine 5-1.5 MG/5ML syrup Commonly known as: HYCODAN Take 5 mLs by mouth every 6 (six) hours as needed for cough.   lidocaine  5 % Commonly known as: Lidoderm  Place 1 patch onto the skin daily. Remove & Discard patch within 12 hours or as directed by MD   loratadine  10 MG tablet Commonly known as: CLARITIN  Take 10 mg by mouth daily.   omeprazole  20 MG capsule Commonly known as: PRILOSEC Take 1 capsule (20 mg total) by mouth 2 (two) times daily. (Needs to be seen before next refill) What changed: when to take this   Pirfenidone  267 MG Tabs Commonly known as: Esbriet  Take 3 tablets (801 mg total) by mouth 3 (three) times daily with meals. (Account: [email or web address]) What changed: how much to take   Polyethyl Glycol-Propyl Glycol 0.4-0.3 % Soln Place 1 drop into both eyes in the morning and at bedtime.   predniSONE  10 MG tablet Commonly known as: DELTASONE  Take 1 tablet (10 mg total) by mouth See admin instructions. Take prednisone  60mg  daily x 2 weeks and then  40 mg daily x 2 weeks then prednisone  30mg  daily x 2 weeks, then 20mg  daily x 2  weeks then 10mg  daily x 2 weeks, then 5mg  daily x 2 weeeks and then 5mg  On MWF x 2 weeks and stop   PROBIOTIC DAILY PO Take 1 capsule by mouth daily.   rosuvastatin  10 MG tablet Commonly known as: CRESTOR  Take 10 mg by mouth at bedtime.   saline Gel Place 1 Application into both nostrils in the morning and at bedtime.   traZODone  100 MG tablet Commonly known as: DESYREL  Take 1 tablet (100 mg total) by mouth at bedtime. For Sleep               Durable Medical Equipment  (From admission, onward)           Start     Ordered   04/01/24 0000  For home use only DME Nebulizer machine       Question Answer Comment  Patient needs a nebulizer to treat with the following condition COPD (chronic obstructive pulmonary disease) (HCC)   Length of Need Lifetime   Additional equipment included Administration kit   Additional equipment included Filter      04/01/24 1323            Major procedures and Radiology Reports - PLEASE review detailed and final reports for all details, in brief -   ***  CT Angio Chest PE W/Cm &/Or Wo  Cm Result Date: 03/27/2024 CLINICAL DATA:  Concern for pulmonary embolism. EXAM: CT ANGIOGRAPHY CHEST WITH CONTRAST TECHNIQUE: Multidetector CT imaging of the chest was performed using the standard protocol during bolus administration of intravenous contrast. Multiplanar CT image reconstructions and MIPs were obtained to evaluate the vascular anatomy. RADIATION DOSE REDUCTION: This exam was performed according to the departmental dose-optimization program which includes automated exposure control, adjustment of the mA and/or kV according to patient size and/or use of iterative reconstruction technique. CONTRAST:  75mL OMNIPAQUE  IOHEXOL  350 MG/ML SOLN COMPARISON:  Chest CT dated 03/04/2024. FINDINGS: Evaluation of this exam is limited due to respiratory motion. Cardiovascular: There is no cardiomegaly or pericardial effusion. Mild atherosclerotic calcification  of the thoracic aorta. No aneurysmal dilatation or dissection. The origins of the great vessels of the aortic arch are patent. Evaluation of the pulmonary arteries is limited due to respiratory motion. No pulmonary artery embolus identified. Mediastinum/Nodes: Mildly enlarged left hilar lymph nodes measure 11 mm. The esophagus is grossly unremarkable. No mediastinal fluid collection. Lungs/Pleura: Background of emphysema. There is chronic post radiation changes and scarring in the right lung. A 2.2 x 1.2 cm nodular density in the right upper lobe appears slightly more rounded since the prior CT. Although this may represent a nodular scarring, recurrent disease is not excluded. PET-CT may provide better evaluation. A 7 mm left lower lobe subpleural nodule similar to prior CT. No pleural effusion or pneumothorax. The central airways are patent. Upper Abdomen: No acute abnormality. Musculoskeletal: No acute osseous pathology. Review of the MIP images confirms the above findings. IMPRESSION: 1. No CT evidence of pulmonary artery embolus. 2. Chronic post radiation changes and scarring in the right lung. A 2.2 x 1.2 cm nodular density in the right upper lobe appears slightly more rounded since the prior CT. Although this may represent a nodular scarring, recurrent disease is not excluded. PET-CT may provide better evaluation. 3. Aortic Atherosclerosis (ICD10-I70.0) and Emphysema (ICD10-J43.9). Electronically Signed   By: Vanetta Chou M.D.   On: 03/27/2024 15:31   DG Chest 2 View Result Date: 03/27/2024 CLINICAL DATA:  Dyspnea EXAM: CHEST - 2 VIEW COMPARISON:  Feb 24, 2024 FINDINGS: No change in the chronic pulmonary infiltrates and atelectasis of the right mid lung and right lower lobe. As well as chronic infiltrates and atelectasis of the left lower lobe. No acute cardiopulmonary infiltrates or significant pleural effusion IMPRESSION: No acute cardiopulmonary infiltrates.  No change since prior Electronically  Signed   By: Franky Chard M.D.   On: 03/27/2024 14:48   CT Chest Wo Contrast Result Date: 03/04/2024 CLINICAL DATA:  Worsening chest pain and shortness of breath. EXAM: CT CHEST WITHOUT CONTRAST TECHNIQUE: Multidetector CT imaging of the chest was performed following the standard protocol without IV contrast. RADIATION DOSE REDUCTION: This exam was performed according to the departmental dose-optimization program which includes automated exposure control, adjustment of the mA and/or kV according to patient size and/or use of iterative reconstruction technique. COMPARISON:  Chest CT dated 02/05/2024. FINDINGS: Evaluation of this exam is limited in the absence of intravenous contrast. Cardiovascular: There is no cardiomegaly or pericardial effusion. There is coronary vascular calcification. Mild atherosclerotic calcification of the thoracic aorta. No aneurysmal dilatation. Mildly dilated main pulmonary trunk suggestive of pulmonary hypertension. Mediastinum/Nodes: No obvious hilar or mediastinal adenopathy. Evaluation however is limited in the absence of contrast and due to consolidative changes of the lungs. The esophagus is grossly unremarkable. No mediastinal fluid collection. Lungs/Pleura: Background of emphysema. Similar appearance of  post radiation scarring in the right lung. An area of subpleural density in the posterolateral right lung base similar to prior CT. No new consolidation. No pleural effusion or pneumothorax. The central airways are patent. Upper Abdomen: No acute abnormality. Musculoskeletal: Osteopenia.  No acute osseous pathology. IMPRESSION: 1. No acute intrathoracic pathology. 2. Similar appearance of post radiation scarring in the right lung. 3. Aortic Atherosclerosis (ICD10-I70.0) and Emphysema (ICD10-J43.9). Electronically Signed   By: Vanetta Chou M.D.   On: 03/04/2024 12:46    Micro Results   *** Recent Results (from the past 240 hours)  Resp panel by RT-PCR (RSV, Flu A&B,  Covid) Anterior Nasal Swab     Status: None   Collection Time: 03/27/24  1:23 PM   Specimen: Anterior Nasal Swab  Result Value Ref Range Status   SARS Coronavirus 2 by RT PCR NEGATIVE NEGATIVE Final    Comment: (NOTE) SARS-CoV-2 target nucleic acids are NOT DETECTED.  The SARS-CoV-2 RNA is generally detectable in upper respiratory specimens during the acute phase of infection. The lowest concentration of SARS-CoV-2 viral copies this assay can detect is 138 copies/mL. A negative result does not preclude SARS-Cov-2 infection and should not be used as the sole basis for treatment or other patient management decisions. A negative result may occur with  improper specimen collection/handling, submission of specimen other than nasopharyngeal swab, presence of viral mutation(s) within the areas targeted by this assay, and inadequate number of viral copies(<138 copies/mL). A negative result must be combined with clinical observations, patient history, and epidemiological information. The expected result is Negative.  Fact Sheet for Patients:  BloggerCourse.com  Fact Sheet for Healthcare Providers:  SeriousBroker.it  This test is no t yet approved or cleared by the [place]  FDA and  has been authorized for detection and/or diagnosis of SARS-CoV-2 by FDA under an Emergency Use Authorization (EUA). This EUA will remain  in effect (meaning this test can be used) for the duration of the COVID-19 declaration under Section 564(b)(1) of the Act, 21 U.S.C.section 360bbb-3(b)(1), unless the authorization is terminated  or revoked sooner.       Influenza A by PCR NEGATIVE NEGATIVE Final   Influenza B by PCR NEGATIVE NEGATIVE Final    Comment: (NOTE) The Xpert Xpress SARS-CoV-2/FLU/RSV plus assay is intended as an aid in the diagnosis of influenza from Nasopharyngeal swab specimens and should not be used as a sole basis for treatment. Nasal  washings and aspirates are unacceptable for Xpert Xpress SARS-CoV-2/FLU/RSV testing.  Fact Sheet for Patients: BloggerCourse.com  Fact Sheet for Healthcare Providers: SeriousBroker.it  This test is not yet approved or cleared by the [place]  FDA and has been authorized for detection and/or diagnosis of SARS-CoV-2 by FDA under an Emergency Use Authorization (EUA). This EUA will remain in effect (meaning this test can be used) for the duration of the COVID-19 declaration under Section 564(b)(1) of the Act, 21 U.S.C. section 360bbb-3(b)(1), unless the authorization is terminated or revoked.     Resp Syncytial Virus by PCR NEGATIVE NEGATIVE Final    Comment: (NOTE) Fact Sheet for Patients: BloggerCourse.com  Fact Sheet for Healthcare Providers: SeriousBroker.it  This test is not yet approved or cleared by the [place]  FDA and has been authorized for detection and/or diagnosis of SARS-CoV-2 by FDA under an Emergency Use Authorization (EUA). This EUA will remain in effect (meaning this test can be used) for the duration of the COVID-19 declaration under Section 564(b)(1) of the Act, 21 U.S.C. section 360bbb-3(b)(1), unless  the authorization is terminated or revoked.  Performed at [facility], [address], [place], [place] [address]   Culture, blood (routine x 2)     Status: None   Collection Time: 03/27/24  1:45 PM   Specimen: BLOOD  Result Value Ref Range Status   Specimen Description BLOOD BLOOD RIGHT FOREARM  Final   Special Requests   Final    BOTTLES DRAWN AEROBIC AND ANAEROBIC Blood Culture results may not be optimal due to an inadequate volume of blood received in culture bottles   Culture   Final    NO GROWTH 5 DAYS Performed at [facility], [address], [place], [place] [address]    Report Status 04/01/2024 FINAL  Final  Culture, blood (routine x 2)      Status: None   Collection Time: 03/27/24  1:45 PM   Specimen: BLOOD  Result Value Ref Range Status   Specimen Description BLOOD LEFT ANTECUBITAL  Final   Special Requests   Final    BOTTLES DRAWN AEROBIC AND ANAEROBIC Blood Culture adequate volume   Culture   Final    NO GROWTH 5 DAYS Performed at [facility], [address], [place], [place] [address]    Report Status 04/01/2024 FINAL  Final   Today   Subjective    Diane [name] today has no ***          Patient has been seen and examined prior to discharge   Objective   Blood pressure 131/76, pulse 88, temperature 98 F (36.7 C), temperature source Oral, resp. rate 20, height 5' 6 (1.676 m), weight 62.1 kg, SpO2 96%.   Intake/Output Summary (Last 24 hours) at 04/01/2024 1324 Last data filed at 04/01/2024 1316 Gross per 24 hour  Intake 480 ml  Output --  Net 480 ml    Exam Gen:- Awake Alert, no acute distress *** HEENT:- [place].AT, No sclera icterus Neck-Supple Neck,No JVD,.  Lungs-  CTAB , good air movement bilaterally CV- S1, S2 normal, regular Abd-  +ve B.Sounds, Abd Soft, No tenderness,    Extremity/Skin:- No  edema,   good pulses Psych-affect is appropriate, oriented x3 Neuro-no new focal deficits, no tremors ***   Data Review   CBC w Diff:  Lab Results  Component Value Date   WBC 6.2 03/27/2024   HGB 10.0 (L) 03/27/2024   HGB 11.2 (L) 02/05/2024   HGB 14.1 06/11/2020   HCT 31.2 (L) 03/27/2024   HCT 42.1 06/11/2020   PLT 286 03/27/2024   PLT 255 02/05/2024   PLT 338 06/11/2020   LYMPHOPCT 15 03/27/2024   MONOPCT 9 03/27/2024   EOSPCT 4 03/27/2024   BASOPCT 0 03/27/2024   CMP:  Lab Results  Component Value Date   NA 135 03/27/2024   NA 139 12/03/2019   K 3.9 03/27/2024   CL 102 03/27/2024   CO2 23 03/27/2024   BUN 15 03/27/2024   BUN 14 12/03/2019   CREATININE 0.57 03/27/2024   CREATININE 0.63 02/05/2024   CREATININE 0.64 10/14/2021   PROT 8.0 03/27/2024   PROT 7.3 12/03/2019    ALBUMIN 3.4 (L) 03/27/2024   ALBUMIN 4.4 12/03/2019   BILITOT 0.4 03/27/2024   BILITOT 0.3 02/05/2024   ALKPHOS 109 03/27/2024   AST 40 03/27/2024   AST 32 02/05/2024   ALT 57 (H) 03/27/2024   ALT 31 02/05/2024  . Total Discharge time is about 33 minutes  Rendall Carwin M.D on 04/01/2024 at 1:24 PM  Go to [email or web address] -  for contact info  [facility] - Office  [phone]

## 2024-04-01 NOTE — Telephone Encounter (Signed)
**Note De-identified  [place] Obfuscation** Please advise 

## 2024-04-01 NOTE — Discharge Instructions (Signed)
 1)You need oxygen  at home at 5 L via nasal cannula continuously at rest and while asleep and up to 12 L with activity  2)Take prednisone  60mg  daily x 2 weeks and then  40 mg daily x 2 weeks then prednisone  30mg  daily x 2 weeks, then 20mg  daily x 2 weeks then 10mg  daily x 2 weeks, then 5mg  daily x 2 weeeks and then 5mg  On MWF x 2 weeks and stop 3)Follow-up with your Pulmonologist Dr. Bertrum Brodie in late July as previously scheduled 4)Hospice services will continue to help you manage your symptoms after discharge home

## 2024-04-01 NOTE — TOC Transition Note (Signed)
 Transition of Care [facility]) - Discharge Note   Patient Details  Name: Diane [name] MRN: [id number] Date of Birth: 1956/04/06  Transition of Care [facility]) CM/SW Contact:  Grandville Lax, LCSWA Phone Number: 04/01/2024, 1:56 PM   Clinical Narrative:    CSW updated by palliative NP that pt and spouse prefer for pt to return home with hospice services through [facility]. CSW spoke to [place] with [facility] who confirms they will follow up with pt to get established on services. TOC signing off.   Final next level of care: Home w Hospice Care Barriers to Discharge: Barriers Resolved   Patient Goals and CMS Choice Patient states their goals for this hospitalization and ongoing recovery are:: return home CMS Medicare.gov Compare Post Acute Care list provided to:: Patient Choice offered to / list presented to : Patient, Spouse      Discharge Placement                       Discharge Plan and Services Additional resources added to the After Visit Summary for                                       Social Drivers of Health (SDOH) Interventions SDOH Screenings   Food Insecurity: No Food Insecurity (03/27/2024)  Housing: Low Risk  (03/27/2024)  Transportation Needs: No Transportation Needs (03/27/2024)  Utilities: Not At Risk (03/27/2024)  Alcohol Screen: Low Risk  (04/12/2018)  Depression (PHQ2-9): Medium Risk (04/03/2023)  Social Connections: Moderately Isolated (03/27/2024)  Tobacco Use: Medium Risk (03/31/2024)     Readmission Risk Interventions     No data to display

## 2024-04-01 NOTE — Progress Notes (Signed)
 Nsg Discharge Note  Admit Date:  03/27/2024 Discharge date: 04/01/2024   Judeen Nose Hanak to be D/C'd Home per MD order.  AVS completed.  Copy for chart, and copy for patient signed, and dated. Patient/caregiver able to verbalize understanding.  Discharge Medication: Allergies as of 04/01/2024       Reactions   Penicillins Shortness Of Breath   Alpha-gal Other (See Comments)   Abdominal Pain All four legged mammals; pork, beef, lamb, deer, etc.        Medication List     TAKE these medications    acetaminophen  325 MG tablet Commonly known as: TYLENOL  Take 2 tablets (650 mg total) by mouth every 4 (four) hours as needed for mild pain (pain score 1-3) or fever (or Fever >/= 101). What changed:  medication strength how much to take reasons to take this   albuterol  (2.5 MG/3ML) 0.083% nebulizer solution Commonly known as: PROVENTIL  Take 3 mLs (2.5 mg total) by nebulization every 2 (two) hours as needed for wheezing or shortness of breath.   Compressor/Nebulizer Misc 1 Units by Does not apply route daily as needed.   DULoxetine  30 MG capsule Commonly known as: CYMBALTA  Take 30 mg by mouth daily.   gabapentin  100 MG capsule Commonly known as: NEURONTIN  Take 200 mg by mouth 3 (three) times daily.   guaiFENesin  600 MG 12 hr tablet Commonly known as: Mucinex  Take 1 tablet (600 mg total) by mouth 2 (two) times daily.   HYDROcodone  bit-homatropine 5-1.5 MG/5ML syrup Commonly known as: HYCODAN Take 5 mLs by mouth every 6 (six) hours as needed for cough.   lidocaine  5 % Commonly known as: Lidoderm  Place 1 patch onto the skin daily. Remove & Discard patch within 12 hours or as directed by MD   loratadine  10 MG tablet Commonly known as: CLARITIN  Take 10 mg by mouth daily.   omeprazole  20 MG capsule Commonly known as: PRILOSEC Take 1 capsule (20 mg total) by mouth 2 (two) times daily. (Needs to be seen before next refill) What changed: when to take this    Pirfenidone  267 MG Tabs Commonly known as: Esbriet  Take 3 tablets (801 mg total) by mouth 3 (three) times daily with meals. (Account: [email or web address]) What changed: how much to take   Polyethyl Glycol-Propyl Glycol 0.4-0.3 % Soln Place 1 drop into both eyes in the morning and at bedtime.   predniSONE  10 MG tablet Commonly known as: DELTASONE  Take 1 tablet (10 mg total) by mouth See admin instructions. Take prednisone  60mg  daily x 2 weeks and then  40 mg daily x 2 weeks then prednisone  30mg  daily x 2 weeks, then 20mg  daily x 2 weeks then 10mg  daily x 2 weeks, then 5mg  daily x 2 weeeks and then 5mg  On MWF x 2 weeks and stop   PROBIOTIC DAILY PO Take 1 capsule by mouth daily.   rosuvastatin  10 MG tablet Commonly known as: CRESTOR  Take 10 mg by mouth at bedtime.   saline Gel Place 1 Application into both nostrils in the morning and at bedtime.   traZODone 100 MG tablet Commonly known as: DESYREL Take 1 tablet (100 mg total) by mouth at bedtime. For Sleep               Durable Medical Equipment  (From admission, onward)           Start     Ordered   04/01/24 0000  For home use only DME Nebulizer machine  Question Answer Comment  Patient needs a nebulizer to treat with the following condition COPD (chronic obstructive pulmonary disease) (HCC)   Length of Need Lifetime   Additional equipment included Administration kit   Additional equipment included Filter      04/01/24 1323            Discharge Assessment: Vitals:   04/01/24 0710 04/01/24 0858  BP:  131/76  Pulse:  88  Resp:  20  Temp:    SpO2: 100% 96%   Skin clean, dry and intact without evidence of skin break down, no evidence of skin tears noted. IV catheter discontinued intact. Site without signs and symptoms of complications - no redness or edema noted at insertion site, patient denies c/o pain - only slight tenderness at site.  Dressing with slight pressure applied.  D/c  Instructions-Education: Discharge instructions given to patient/family with verbalized understanding. D/c education completed with patient/family including follow up instructions, medication list, d/c activities limitations if indicated, with other d/c instructions as indicated by MD - patient able to verbalize understanding, all questions fully answered. Patient instructed to return to ED, call 911, or call MD for any changes in condition.  Patient escorted via WC, and D/C home via private auto.  Darrell Else, RN 04/01/2024 1:49 PM

## 2024-04-01 NOTE — Progress Notes (Signed)
 Mobility Specialist Progress Note:    04/01/24 0918  Mobility  Activity Ambulated with assistance in hallway  Level of Assistance Modified independent, requires aide device or extra time  Assistive Device None  Distance Ambulated (ft) 65 ft  Range of Motion/Exercises Active;All extremities  Activity Response Tolerated well  Mobility Referral Yes  Mobility visit 1 Mobility  Mobility Specialist Start Time (ACUTE ONLY) [id number]  Mobility Specialist Stop Time (ACUTE ONLY) [id number]  Mobility Specialist Time Calculation (min) (ACUTE ONLY) 14 min   Pt received in bed, eager for mobility. ModI to stand and ambulate with no AD. Tolerated well, SpO2 92% on 5L at rest. Desat to 83% on 8L during ambulation. SpO2 91% on 10L during ambulation. Audible SOB, shakiness, and fatigue. Returned to room, returned pt to 5L, SpO2 91%. Notified MD, all needs met.   Glinda Lapping Mobility Specialist Please contact via Special educational needs teacher or  Rehab office at [phone]

## 2024-04-01 NOTE — Telephone Encounter (Signed)
 Yes this is fine.  Please do the order

## 2024-04-03 ENCOUNTER — Ambulatory Visit (HOSPITAL_COMMUNITY)

## 2024-04-08 ENCOUNTER — Ambulatory Visit (HOSPITAL_COMMUNITY)

## 2024-04-10 ENCOUNTER — Ambulatory Visit (HOSPITAL_COMMUNITY)

## 2024-04-15 ENCOUNTER — Ambulatory Visit (HOSPITAL_COMMUNITY)

## 2024-04-17 ENCOUNTER — Ambulatory Visit (HOSPITAL_COMMUNITY)

## 2024-04-22 ENCOUNTER — Ambulatory Visit (HOSPITAL_COMMUNITY)

## 2024-04-24 ENCOUNTER — Ambulatory Visit (HOSPITAL_COMMUNITY)

## 2024-04-29 ENCOUNTER — Ambulatory Visit (HOSPITAL_COMMUNITY)

## 2024-05-01 ENCOUNTER — Ambulatory Visit (HOSPITAL_COMMUNITY)

## 2024-05-06 ENCOUNTER — Ambulatory Visit (HOSPITAL_COMMUNITY)

## 2024-05-08 ENCOUNTER — Ambulatory Visit (HOSPITAL_COMMUNITY)

## 2024-05-09 ENCOUNTER — Encounter: Payer: Self-pay | Admitting: Internal Medicine

## 2024-05-12 ENCOUNTER — Telehealth: Payer: Self-pay | Admitting: Internal Medicine

## 2024-05-12 ENCOUNTER — Other Ambulatory Visit: Payer: Self-pay | Admitting: Internal Medicine

## 2024-05-12 DIAGNOSIS — C3490 Malignant neoplasm of unspecified part of unspecified bronchus or lung: Secondary | ICD-10-CM

## 2024-05-12 NOTE — Telephone Encounter (Signed)
 Scheduled appointment with the patient. She is aware of the appointment details.

## 2024-05-13 ENCOUNTER — Ambulatory Visit (HOSPITAL_COMMUNITY)

## 2024-05-14 ENCOUNTER — Encounter (HOSPITAL_COMMUNITY)
Admission: RE | Admit: 2024-05-14 | Discharge: 2024-05-14 | Disposition: A | Discharge status: Home / Self Care | Source: Ambulatory Visit | Attending: Internal Medicine | Admitting: Internal Medicine

## 2024-05-14 DIAGNOSIS — R918 Other nonspecific abnormal finding of lung field: Secondary | ICD-10-CM | POA: Diagnosis not present

## 2024-05-14 DIAGNOSIS — C3490 Malignant neoplasm of unspecified part of unspecified bronchus or lung: Secondary | ICD-10-CM | POA: Diagnosis present

## 2024-05-14 DIAGNOSIS — R590 Localized enlarged lymph nodes: Secondary | ICD-10-CM | POA: Insufficient documentation

## 2024-05-14 LAB — GLUCOSE, CAPILLARY: Glucose-Capillary: 89 mg/dL (ref 70–99)

## 2024-05-14 MED ORDER — FLUDEOXYGLUCOSE F - 18 (FDG) INJECTION
7.0700 | Freq: Once | INTRAVENOUS | Status: AC
  Administered 2024-05-14: 7.07 via INTRAVENOUS

## 2024-05-15 ENCOUNTER — Ambulatory Visit (INDEPENDENT_AMBULATORY_CARE_PROVIDER_SITE_OTHER): Admitting: Internal Medicine

## 2024-05-15 ENCOUNTER — Encounter: Payer: Self-pay | Admitting: Internal Medicine

## 2024-05-15 ENCOUNTER — Ambulatory Visit (HOSPITAL_COMMUNITY)

## 2024-05-15 VITALS — BP 124/66 | HR 94 | Temp 97.9°F | Ht 66.0 in | Wt 149.0 lb

## 2024-05-15 DIAGNOSIS — J9611 Chronic respiratory failure with hypoxia: Secondary | ICD-10-CM

## 2024-05-15 DIAGNOSIS — J439 Emphysema, unspecified: Secondary | ICD-10-CM | POA: Diagnosis not present

## 2024-05-15 DIAGNOSIS — J849 Interstitial pulmonary disease, unspecified: Secondary | ICD-10-CM

## 2024-05-15 DIAGNOSIS — J84112 Idiopathic pulmonary fibrosis: Secondary | ICD-10-CM | POA: Diagnosis not present

## 2024-05-15 DIAGNOSIS — Z862 Personal history of diseases of the blood and blood-forming organs and certain disorders involving the immune mechanism: Secondary | ICD-10-CM

## 2024-05-15 DIAGNOSIS — R053 Chronic cough: Secondary | ICD-10-CM | POA: Diagnosis not present

## 2024-05-15 DIAGNOSIS — J700 Acute pulmonary manifestations due to radiation: Secondary | ICD-10-CM | POA: Diagnosis not present

## 2024-05-15 DIAGNOSIS — Z7952 Long term (current) use of systemic steroids: Secondary | ICD-10-CM

## 2024-05-15 DIAGNOSIS — R0600 Dyspnea, unspecified: Secondary | ICD-10-CM

## 2024-05-15 DIAGNOSIS — G893 Neoplasm related pain (acute) (chronic): Secondary | ICD-10-CM

## 2024-05-15 LAB — HEPATIC FUNCTION PANEL
ALT: 13 U/L (ref 0–35)
AST: 13 U/L (ref 0–37)
Albumin: 4 g/dL (ref 3.5–5.2)
Alkaline Phosphatase: 52 U/L (ref 39–117)
Bilirubin, Direct: 0.1 mg/dL (ref 0.0–0.3)
Total Bilirubin: 0.3 mg/dL (ref 0.2–1.2)
Total Protein: 8.1 g/dL (ref 6.0–8.3)

## 2024-05-15 LAB — CBC WITH DIFFERENTIAL/PLATELET
Basophils Absolute: 0 K/uL (ref 0.0–0.1)
Basophils Relative: 0.1 % (ref 0.0–3.0)
Eosinophils Absolute: 0 K/uL (ref 0.0–0.7)
Eosinophils Relative: 0.2 % (ref 0.0–5.0)
HCT: 30.6 % — ABNORMAL LOW (ref 36.0–46.0)
Hemoglobin: 10.1 g/dL — ABNORMAL LOW (ref 12.0–15.0)
Lymphocytes Relative: 4 % — ABNORMAL LOW (ref 12.0–46.0)
Lymphs Abs: 0.5 K/uL — ABNORMAL LOW (ref 0.7–4.0)
MCHC: 33.1 g/dL (ref 30.0–36.0)
MCV: 93.6 fl (ref 78.0–100.0)
Monocytes Absolute: 0.3 K/uL (ref 0.1–1.0)
Monocytes Relative: 2.9 % — ABNORMAL LOW (ref 3.0–12.0)
Neutro Abs: 10.6 K/uL — ABNORMAL HIGH (ref 1.4–7.7)
Neutrophils Relative %: 92.8 % — ABNORMAL HIGH (ref 43.0–77.0)
Platelets: 413 K/uL — ABNORMAL HIGH (ref 150.0–400.0)
RBC: 3.27 Mil/uL — ABNORMAL LOW (ref 3.87–5.11)
RDW: 13.2 % (ref 11.5–15.5)
WBC: 11.4 K/uL — ABNORMAL HIGH (ref 4.0–10.5)

## 2024-05-15 NOTE — Patient Instructions (Addendum)
 IPF (idiopathic pulmonary fibrosis) (HCC) with Radiation Fibrosis Pulmonary emphysema, unspecified emphysema type (HCC) History of lung cancer- stgage 2 b with recurrence right lower lobe June 2024 Chronic respiratory failure with hypoxia (HCC)   - Dramatic worsneing of respiratory failure in Jun 2025 of unclear reason - - no evidence of pulmonary hypertension on echo early 2025 but might have developed it byt npow - s/p radiation to lung endingl August 2024 - Severe cough due to fibrosis and lung cancer Hycodan helping - PET Scan results pending  Plan - For respiratory failure  - -Continue oxygen  6 L at rest [likely need 4 L nasal cannula at night as well] and use 8-12 L with exertion  -For cough  - continue Hycodan 5 mL twice daily with hospice   - for anemia followup  - check cbc 05/15/2024   - continue esbriet  (pirfenidone ) as before; low dose protocol  - check LFT 05/15/2024   - for associated emphysema  -continue spiriva  - await formal radiology report  - will ask radiologist if there is potential role for steroids for the radiation pneumniitisi  - for cancer recurrence - awiat PET scan results   -for possible radiation pneumonitis   - noted steroid side effect with Cushing's weight gain and irritability   - Change prednisone  to 20 mg once daily x 1 week and then 10 mg once daily x 1 week and then 5 mg once daily x 1 week and then 5 mg Monday Wednesday Friday x 1 week and then 5 mg on Monday x 1 week and stop   - rule out Pulmonary hypertension   - Recommend right heart catheterization if PET scan does not reveal any cancer recurrence  - Please let us  know after your oncology visit or after your PET scan results of formal so I can make a referral to cardiology  - cancer pain  - treatment by PCP   - overall symptms  - contiue home hospice care  Plan = 12 weeks x 2-minute visit with Dr. Geronimo

## 2024-05-15 NOTE — Progress Notes (Signed)
 OV 11/13/2022 transfer of care to the ILD center with Dr. Geronimo.  Subjective:  Patient ID: Diane [name], female , DOB: 1956-05-09 , age 68 y.o. , MRN: [id number] , ADDRESS: [address] [place] [address] PCP [name], MD Patient Care Team: [name], MD as PCP - General (Internal Medicine) Shaaron Lamar HERO, MD as Consulting Physician (Gastroenterology) Livingston Rigg, MD (Inactive) as Consulting Physician (Dermatology)  This Provider for this visit: Treatment Team:  Attending Provider: Geronimo Amel, MD    11/13/2022 -   Chief Complaint  Patient presents with   New Patient (Initial Visit)    New pt from [facility] for ILD. Patient filled out packet and for Esbriet  but never started the medication. CT is for April 2024. Pt is just getting over PNA. Still has a slight cough. Just finished prednisone  and Levaquin .      HPI Diane [name] 68 y.o. -presents with her husband Diane [name].  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  She retire and then in the spring 2023 was diagnosed with right lower lobe non-small cell cancer stage II.  She is status post radiation and also chemotherapy.  Currently on observation treatment.  This is based on independent history from the husband and the patient and also review of the external records.  She is understandably quite frustrated with the onset of all these health issues given the fact she just retired.  Then approximately in September 2023 started noticing significant amount of dyspnea on exertion walking the dog and climbing stairs.  At this time desaturations with exercise to be noticed.  She has since been using oxygen  and this helps her.  She is attended cardiac rehabilitation or pulmonary rehabilitation and this also helped her.  She was then given a diagnosis of pulmonary fibrosis/UIP.  Antifibrotic pirfenidone  was started based on her history and also external record  review.  However due to some confusion of the paperwork this has not been started yet.  She is here for evaluation of her ILD.  Visualization of CT scan from 2015 shows presence of possible early ILD even back in 2015 with some honeycombing/emphysema at the base.  There was a craniocaudal gradient back pain.   Also of note she just went to a [place] cruise in mid January 2024.  When she came back on October 28, 2022.  Then on October 30, 2022 she had cough headaches no fever.  Her flu test and COVID was negative.  She is being treated for acute bronchitis with Levaquin  and IM steroids and 6-day prednisone  taper.  She will she was given a diagnosis of pneumonia actually.  She is almost back to baseline but still feels fatigued and still with some cough worse than her baseline.  Integrated Comprehensive ILD Questionnaire  Symptoms:  0 she believes she has had much rapid worsening in the last 6 months only.  Suspect this is after her radiation.  Current symptom scores are as below.   Past Medical History :  -She was told 20 years ago she had hiatal hernia and acid reflux but she says she has had no issues since then.  It is definitely not described in the current CT chest -She is awaiting a sleep study -She had seizures 12 years ago. -She has hypertension controlled on medication -Currently just finished antibiotic and steroid course-has had COVID-vaccine but never had COVID    ROS:  -Tired and does have dry eyes -Has  acid reflux for which she is taking omeprazole   FAMILY HISTORY of LUNG DISEASE:  *-Mother had asthma but there is no family history of lung disease  PERSONAL EXPOSURE HISTORY:   -She smoked between 1973 and 2022.  Smoked 1 pack/day and then quit.  In the 1980s and 1990s very briefly very rarely she did some cocaine.  She smoked some marijuana between 1976 and 2022 very minimal.  HOME  EXPOSURE and HOBBY DETAILS :  [place]  The home  is 67 years old.  She is lived there for 19 years.  She recently got her down comforter 3 months ago but otherwise no organic antigen exposure in the house.  Although there is a leaky roof for the last 1 year in 2021 but there is no mold.  She is retired.  She works at [employer].  She works in [employer].  She did desk job.  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  INVESTIGATIONS: -As below    HRCT dec 2023 -personally visualized and independently agree  COMPARISON:  Chest CT 06/27/2022.   FINDINGS: Cardiovascular: Heart size is normal. There is no significant pericardial fluid, thickening or pericardial calcification. There is aortic atherosclerosis, as well as atherosclerosis of the great vessels of the mediastinum and the coronary arteries, including calcified atherosclerotic plaque in the left main, left anterior descending and right coronary arteries.   Mediastinum/Nodes: No pathologically enlarged mediastinal or left hilar lymph nodes. Soft tissue fullness in the right hilar region concerning for underlying lymphadenopathy, but poorly evaluated on today's noncontrast CT examination (best appreciated on axial image 60 of series 2). Esophagus is unremarkable in appearance. No axillary lymphadenopathy.   Lungs/Pleura: Treated lesion in the right lower lobe is now partially obscured by surrounding parenchymal changes, but is estimated to measure approximately 2.1 x 1.4 cm (axial image 114 of series 4). There has been a dramatic increase in surrounding areas of ground-glass attenuation, septal thickening, bronchiectasis and cystic changes, and regional architectural distortion in the surrounding lung parenchyma, presumably reflective of evolving postradiation changes. Several nodular areas of  architectural distortion are now noted in the right lung, most notably a macrolobulated nodule with spiculated margins in the right upper lobe (axial image 86 of series 4) measuring 1.9 x 1.3 cm, and a slightly ill-defined 1.9 x 1.1 cm nodular area of architectural distortion in the right lower lobe at the base (axial image 185 of series 4). Small right pleural effusion predominantly lying dependently. No left pleural effusion. In the remaining portions of the lungs are widespread areas of septal thickening, subpleural reticulation, traction bronchiectasis, peripheral bronchiolectasis and honeycombing, most evident in the mid to lower lung distribution. Inspiratory and expiratory imaging is unremarkable. There is also a background of mild diffuse bronchial wall thickening with moderate centrilobular and paraseptal emphysema.   Upper Abdomen: Aortic atherosclerosis.  Status post cholecystectomy.   Musculoskeletal: There are no aggressive appearing lytic or blastic lesions noted in the visualized portions of the skeleton.  IMPRESSION: 1. The appearance of the lungs is compatible with study. 2. Evolving postradiation changes in the right lung. Treated nodule in the superior segment of the right lower lobe appears grossly similar to the prior study, while there are new nodules in the right lung, most concerning of which is in the right upper lobe (axial  image 86 of series 4), currently measuring 1.9 x 1.3 cm. Close attention on follow-up studies is recommended. These new areas of nodularity could be treatment related or infectious or inflammatory in etiology, however, neoplasm is not excluded. 3. Mild diffuse bronchial wall thickening with moderate centrilobular and paraseptal emphysema; imaging findings suggestive of underlying COPD. 4. Aortic atherosclerosis, in addition to left main and 2 vessel coronary artery disease. Please note that although the presence of coronary artery  calcium  documents the presence of coronary artery disease, the severity of this disease and any potential stenosis cannot be assessed on this non-gated CT examination. Assessment for potential risk factor modification, dietary therapy or pharmacologic therapy may be warranted, if clinically indicated.   Aortic Atherosclerosis (ICD10-I70.0) and Emphysema (ICD10-J43.9).     Electronically Signed   By: Toribio Aye M.D.   On: 09/27/2022 07:43   Telephone with SAra 12/13/22 tient called the office 12/13/2022  to get her sleep study results.  While I was talking with the patient she was short of breath and shared that she had increased oxygen  demands. She said this has been going on for several weeks since she returned from  a cruise she took with friends to celebrate finishing chemo and radiation . This was a [place] cruise in January 2024. She returned October 28, 2022. Then on October 30, 2022 she had cough headaches no fever. Her flu test and COVID was negative. She was treated for acute bronchitis and pneumonia  with Levaquin  and IM steroids and 6-day prednisone  taper.  She was feeling better but still fatigued and had a and still with some cough worse than her baseline  when she saw Dr. Geronimo  11/13/2022.  I ordered a CXR at [facility] (Closer to patient ).I also ordered a home tank with a higher liter flow capacity as her oxygen  demand was so much higher.. CXR Results 2/28 confirmed >>  Diffuse bilateral interstitial pulmonary opacity, increased compared to prior examination, consistent with edema or infection. No focal airspace opacity. Unchanged post treatment appearance of the right midlung.I called the patient with the results of her scan and sent in prescriptions for Doxycycline  100 mg BID x 7 days and a prednisone  taper Prednisone  taper; 10 mg tablets: 4 tabs x 2 days, 3 tabs x 2 days, 2 tabs x 2 days 1 tab x 2 days then stop. She has a penicillin allergy , and history of QT  prolongation .  I  asked her to start Mucinex  1200 mg each morning with a glass of water  and start using her flutter valve. She states her secretions are thick and hard to get up. I have asked her to use her albuterol  nebs in the morning and evening while she is sick, and to wear her oxygen  at bedtime at 2  liters. When I asked if she has had a fever, she said she did not think so, but when I asked if she had chills , she said she has had a lot of couch time covered in blankets, so endorses that she may have had fever. I asked Nathanel to check in with me Friday 12/15/2022 to ensure she was improving. We discussed that if she does not start turning around soon, we may need to consider IV antibiotics. We discussed if she gets worse , not better to call the office  or seek emergency care.  I reviewed her last echo from 11/15/2022, which shows EF of 60-65%, LV normal function, Left ventricular diastolic parameters  are consistent with Grade I diastolic  dysfunction. She denied any lower extremity edema. She does have a follow up CT Chest scheduled 01/25/2023 per oncology,I plan to  order one earlier if she does not improve.    12/20/2022 -office with Diane [name]  Pt. Presents for short term follow up of multifocal pneumonia noted on CXR 12/13/2022. She was treated with Doxycycline  and prednisone  taper 12/13/2022 for her slow to resolve pneumonia . She states she is feeling much better. She states it is like night and day. She states she is back to her baseline. She is using 3 L [place] at rest and 4 L with exertion. Her oxygen  demand is back to her baseline and she has her energy back. She does still have a cough, which is productive . She coughed in the office and she had some blood streaks in it. She states this is new, and she thinks it is from coughing. Secretions are mostly clear with some tan bits. She denies fever or chest pain. She has completed the Doxycycline  and has her last dose of prednisone  tomorrow. I will give her  an additional 7 days of 10 mg prednisone  daily to cover her until she sees Dr. Geronimo next  week.  She started perfididone Monday 12/18/2022. I have told her to let us  know if she has any stomach upset.  She is much more compliant with her oxygen  use. She had her oxygen  on today in the office. She is monitoring her oxygen  levels. She is compliant with her Anoro daily. She is using her albuterol  as needed. She has been using her Mucinex  with flutter valve for secretion mobilization.   She does have headaches, which I think are from her cough. I have prescribed her cough medication with Codeine to see if that resolves the headaches.   She is still waiting in her CPAP machine and her 10 L home oxygen  concentrator. My nurse will check with [employer] about the delay.      12/26/2022 -   Chief Complaint  Patient presents with   Follow-up    PFT F/up   r  HPI Diane [name] 68 y.o. -returns for follow-up.  She presents with her husband.  She tells me that after she saw me she got the prednisone  taper and felt transiently better but then when she had a phone call with Diane Schultze it was noticed that she was quite symptomatic.  She was then called in for an acute visit and given 7-10 days of daily prednisone  along with an antibiotic.  This then has helped her come back to baseline.  She feels she is currently at baseline.  At the time it was noticed that she was desaturating easily and she needed significant amount of oxygen  to correct.  Her home oxygen  concentrator only go up to 5 L.  Therefore we did a oxygen  titration test today and she actually needs 8-10 L to correct and walk our standard 3 laps in the office.  Nevertheless she feels closer to baseline.  She still has a lot of fatigue and shortness of breath and she wants to improve on this.  She is open and willing to attend pulmonary rehabilitation.  Apparently referral has been made by me and by nurse practitioner but she still has not heard.   I did indicate to her we will make another referral again.  She also told me that she is now touch base with Prentice Sluder the support group chair and she  is enjoying those conversations.  She had pulmonary function test that compared to a year ago shows significant decline at least 16% and the FVC decline in a year.  She just started Esbriet  for this and as of yesterday has gone to 3 pills 3 times daily.  She is to have an echo and pulmonary artery pressures are reported as normal  Other than fatigue which she does not believe is due to pirfenidone  she is actually tolerating the pirfenidone  quite well.    02/06/2023 Pt. Presents for follow up 6 weeks after seeing Dr. Geronimo. She was started on Esbriet  10/31/2022.She initially was  Unable tolerate 3 tabs TID ( vomiting and constant  nausea). She dropped the dose in March to 2 tabs TID, and then tried to increase to 3 tabs daily again and was nauseated with weight loss of 5 pounds in 1 week. She dropped her dose back to 2 tablets TID  on  02/04/2023  and she is able to tolerate this dose. She is tolerating the lower dose well, already feels better after 2 days on the lower dose. She has been able to tolerate 2 tablets three times daily.We will hold at this dose for now until she sees Dr. Geronimo again in 6 weeks. LFT's drawn at the cancer center 01/25/2023 show  AST of 16, ALT if 11, Alk Phos of 64 and total bili of 0.3, so all WNL.She states she is no longer drinking wine , which is most likely helping maintain normal hepatic labs. Her CBC did show some anemia. HGB of 11.3 with a WBC of 7.5. This is about a  1.3 gram drop over previous HGB which was 12.6 four months ago. She has been started on Iron by Dr. Sherrod, and she has follow up labs placed by Dr.Mohamed  Ninel looks great today. No fever, no change in secretions, no change in shortness of breath. She feels she is at her baseline. Current oxygen  use is 4L [place] at rest and 6 L with exertion. She is  wearing her oxygen  at all times, and she maintains saturations > 92%. She is not using her CPAP with everything she has going on but she is wearing her oxygen  at bedtime. She does have a POC that her husband  purchased without insurance , and she uses this when driving in the car, as it meets her at rest oxygen  needs.  She is doing well at pulmonary rehab. She comes to the [place] rehab office and she feels she is getting a tremendous benefit from this. She is also meeting other ILD patient's and gets comfort and support from them.   There were some changes on her surveillance CT Chest  01/27/2023. Her previous CT scan in December 2023 showed finding compatible with a combination of emphysema and interstitial lung disease which has been progressive.  She also had evolving postradiation changes in the right lung and the treated nodule in the superior segment of the right lower lobe appeared grossly similar but there were new nodules within the right lung most concerning of which is in the right upper lobe and currently measures 1.9 x 1.3 cm requiring close monitoring.  She had a PET scan performed in early January 2024 and it showed no concerning findings for disease progression,  in the area of concern showed moderate metabolic activity that are nonspecific and may be related to infectious or inflammatory process but neoplasm could not be completely excluded. She has follow up CT ordered for  3 months which will be July 2024. She knows to call immediately for any concerning symptoms or changes in her breathing.   We are doing a therapeutic trial with Breztri . She is currently on Anoro which she states does not seem to help with her breathing. We discussed that there is a steroid component to the Breztri  which can increase risk of pneumonia. She knows to stop using inhaler and call to be seen if she has any changes in her breathing status. I have also prescribed an albuterol  inhaler as she does have episodes  of what sounds like upper airway wheezing.   We need to continue to monitor for weight loss. On the Esbriet  3 tabs TID she lost a significant amount of weight. We will re-evaluate her weight upon follow up with Dr. Geronimo in 6 weeks on the 2 tablets TID.  She will need repeat LFT's in 4-6 weeks , She sees Dr. Chiquita 03/22/2023.      Test Results: CT Chest with contrast 01/27/2023 Slight increase in focal opacity involving the superior segment right lower lobe with increasing pleural thickening and loculated small pleural fluid.   The previously seen hypermetabolic nodules in the right upper lobe and lower lobe separately are again noted today in our appear relatively similar when adjusting for technique.   Persistent soft tissue fullness of the right lung hilum as well as some prominent nodes in the mediastinum and left hilum.   Underlying advanced emphysematous lung changes.   Aortic Atherosclerosis (ICD10-I70.0) and Emphysema (ICD10-J43.9).   OV 03/22/2023  Subjective:  Patient ID: Diane [name], female , DOB: 04-15-1956 , age 77 y.o. , MRN: [id number] , ADDRESS: [address] [place] [address] PCP [name], MD Patient Care Team: [name], MD as PCP - General (Internal Medicine) Shaaron, Lamar HERO, MD as Consulting Physician (Gastroenterology) Livingston Rigg, MD (Inactive) as Consulting Physician (Dermatology)  This Provider for this visit: Treatment Team:  Attending Provider: Geronimo Amel, MD  #IPF/UIP pattern #Associated pulmonary emphysema #Combined pulmonary fibrosis emphysema  -Chronic green sputum #Lung cancer #DNR , ECOG 3-4, Class 4 dyspnea  03/22/2023 -   Chief Complaint  Patient presents with   Follow-up    PFT:03/22/23 O2 4l     HPI Diane [name] 68 y.o. -returns for follow-up.  Since her last visit her symptoms have deteriorated.  Pulmonary function test also shows a steep decline in both FVC and particularly with  DLCO.  She tells me that ever since January 2024 she is having a steady decline but more so in the last 1 month.  And send the last 1 month her shower and getting dressed time has gotten worse.  She used to be able to get ready within 30 minutes now she is requiring 1 hour.  However at pulmonary rehabilitation the max oxygen  she is required has been 8-10 L [4 L at rest] but 2 days ago abruptly she required 15 L and I was alerted about it.  She is denying any hemoptysis or fever or chills.  She has chronic green sputum but this is not change.  There is no worsening of cough there is no edema.  She is not on any anticoagulation.  In addition she is also noticing abrupt spasms of breathing for the last 1 month is almost a stridorous noise.  She was sucking air and it is loud it happens spontaneously.  Happens around 20 times a day.  Her husband comes rushing from the next  room.  But she states her pulse ox remained stable.  Correlating with all this is the pulmonary function test to significantly decline.  Also CT scan of the chest in April 2024 that I personally visualized is significantly worse.  Dr. Sherrod is doing another CT scan in July 2024.  Today she went to her primary care physician at [facility] but I do not have the results she showed me lipid results but she believes the liver function test was normal.  She says that she has been given a life expectancy of less than 1 year she is upset about this.  But she says she is also realistic.  She says she has advanced directives she is working on it.  She is agreed to be a DNR.  Symptom wise significant cough is there but dyspnea is even worse.  She is willing to get Hycodan to help both symptoms  Overall with her quality of life and decline while she is looking for the reversible and looking to remain as healthy and as alive as possible she is also willing to be seen by palliative care at home.  We did a goals of care discussion about all this.  Filled out [employer] emergency backup electricity form.  Also filled out handicap placard form.    OV 04/13/2023  Subjective:  Patient ID: Diane [name], female , DOB: Nov 23, 1955 , age 36 y.o. , MRN: [id number] , ADDRESS: [address] [place] [address] PCP [name], MD Patient Care Team: [name], MD as PCP - General (Internal Medicine) Shaaron Lamar HERO, MD as Consulting Physician (Gastroenterology) Livingston Rigg, MD (Inactive) as Consulting Physician (Dermatology)  This Provider for this visit: Treatment Team:  Attending Provider: Geronimo Amel, MD  Type of visit: Video Virtual Visit Identification of patient Diane [name] with 1956/02/05 and MRN [id number] - 2 person identifier Risks: Risks, benefits, limitations of telephone visit explained. Patient understood and verbalized agreement to proceed Anyone else on call: husband Patient location: her home This provider location: [address], [address]; [place]; [place] [address]. [facility]. [phone]    04/13/2023 -   Chief Complaint  Patient presents with   Follow-up    F/up discuss intervenous steroids and discuss some other medical issues.     HPI Diane [name] 68 y.o. - preents with husband on video visit . Has been betting worse. . IN interim, ABG without hypercapnia. CTA ruled out PE but showed worsning RLL nodule. Has known post XRT consolidation in RLL. No ILD flare up. Saw onc and rad onc. Notes reviewed. Advised SBRT mid uly x 5 sessions. She is worriued about ILD flare with XRT. But she is on pirfenidone . Wants me clearance pripr to SBRT and also if neeed to do high dose steroids    CTA 6/10;/24  Narrative & Impression  CLINICAL DATA:  Worsening shortness of breath and chest discomfort 3 weeks. Elevated D-dimer. Lung carcinoma. Undergoing chemotherapy. * Tracking Code: BO *   EXAM: CT ANGIOGRAPHY CHEST WITH CONTRAST   TECHNIQUE: Multidetector CT imaging of the  chest was performed using the standard protocol during bolus administration of intravenous contrast. Multiplanar CT image reconstructions and MIPs were obtained to evaluate the vascular anatomy.   RADIATION DOSE REDUCTION: This exam was performed according to the departmental dose-optimization program which includes automated exposure control, adjustment of the mA and/or kV according to patient size and/or use of iterative reconstruction technique.   CONTRAST:  75mL OMNIPAQUE   IOHEXOL  350 MG/ML SOLN   COMPARISON:  01/25/2023   FINDINGS: Cardiovascular: Satisfactory opacification of pulmonary arteries noted, and no pulmonary emboli identified. No evidence of thoracic aortic dissection or aneurysm. Aortic and coronary atherosclerotic calcification incidentally noted.   Mediastinum/Nodes: No masses or pathologically enlarged lymph nodes identified.   Lungs/Pleura: Centrilobular and paraseptal emphysema again noted. Chronic changes of subpleural fibrosis and honeycombing again seen with bibasilar predominance. Areas of confluent opacity and central bronchiectasis are again seen in the right upper and lower lobes, without significant change. A poorly defined nodular opacity in the posterior right lower lobe on image 95/5 currently measures 1.9 x 1.8 cm, compared to 1.2 x 1.0 cm previously. No other new or enlarging sites of pulmonary opacity identified. No evidence of pleural effusion.   Upper abdomen: No acute findings.   Musculoskeletal: No suspicious bone lesions identified.   Review of the MIP images confirms the above findings.   IMPRESSION: No evidence of pulmonary embolism.   Increased size of 1.9 cm nodular opacity in posterior right lower lobe, suspicious for carcinoma.   No significant change in areas of right lung airspace disease and bronchiectasis, likely due to post radiation changes.   Chronic pulmonary emphysema and fibrosis.   Aortic Atherosclerosis  (ICD10-I70.0) and Emphysema (ICD10-J43.9).     Electronically Signed   By: Norleen DELENA Kil M.D.   On: 03/26/2023 16:10      NM PET scan 04/05/23  Narrative & Impression  CLINICAL DATA:  Subsequent treatment strategy for lung carcinoma. Chemotherapy ongoing. Concern for increasing RIGHT lower lobe nodule.   EXAM: NUCLEAR MEDICINE PET SKULL BASE TO THIGH   TECHNIQUE: 7.47 mCi F-18 FDG was injected intravenously. Full-ring PET imaging was performed from the skull base to thigh after the radiotracer. CT data was obtained and used for attenuation correction and anatomic localization.   Fasting blood glucose: 110 mg/dl   COMPARISON:  CT 93/89/7975 com PET-CT 10/04/2022   FINDINGS: Mediastinal blood pool activity: SUV max 2.0   Liver activity: SUV max NA   NECK: No hypermetabolic lymph nodes in the neck.   Incidental CT findings: None.   CHEST: Rounded focus of intense metabolic activity in the RIGHT lower lobe measures 2.7 cm with SUV max equal 15.4. This corresponds to nodule of concern on comparison CT.   Band of parenchymal consolidation in the more superior RIGHT lower lobe has mild metabolic activity favored post radiation change. Band parenchymal consolidation in the RIGHT upper lobe is also similar mild metabolic activity.   No hypermetabolic mediastinal lymph nodes.   Incidental CT findings: None.   ABDOMEN/PELVIS: No abnormal hypermetabolic activity within the liver, pancreas, adrenal glands, or spleen. No hypermetabolic lymph nodes in the abdomen or pelvis.   Incidental CT findings: None.   SKELETON: No focal hypermetabolic activity to suggest skeletal metastasis.   Incidental CT findings: None.   IMPRESSION: 1. Persistent pulmonary nodule in the inferior RIGHT lower lobe with intense metabolic activity is concerning for lung cancer recurrence. 2. Consolidation in the superior aspect of the RIGHT lower lobe and RIGHT upper lobe is similar to prior and  most consistent with post radiation change. 3. No hypermetabolic mediastinal lymph nodes. 4. No distant metastatic disease.     Electronically Signed   By: Jackquline Boxer M.D.   On: 04/09/2023 10:22     OV 08/07/2023  Subjective:  Patient ID: Diane [name], female , DOB: 05/15/1956 , age 55 y.o. , MRN: [id number] , ADDRESS: [address]  [place] [address] PCP [name], MD Patient Care Team: [name], MD as PCP - General (Internal Medicine) Shaaron Lamar HERO, MD as Consulting Physician (Gastroenterology) Livingston Rigg, MD (Inactive) as Consulting Physician (Dermatology)  This Provider for this visit: Treatment Team:  Attending Provider: Geronimo Amel, MD    08/07/2023 -   Chief Complaint  Patient presents with   Follow-up    Ct f/u,      HPI Diane [name] 68 y.o. -returns for follow-up.  Here with husband was independent historian he says he is doing she is doing well for her combined pulmonary fibrosis and emphysema in the setting of lung cancer.  It is now 3 months since she got radiation.  Confirmed on external medical record review.  Overall she is leading a stable life from a respiratory standpoint this can be evidence in the symptom score below.  She uses 4 L nasal cannula at rest and 8-10 L with oxygen .  She puts oxygen  on a backpack and walks her dog.  She is able to do a third of a mile without stopping.  Today her pulse ox on room air at rest was 75% and she did feel a little winded.  She is able to do her activities of daily living.  She had a CT scan of the chest that I personally visualized and it looks stable.  She has had multiple CT scans of the chest she is going to see oncology next today.  Her cough is controlled with the help of opioids.  I noticed that she not taking any inhalers because it was expensive.  Give her samples of Spiriva to try.  Lab results from October 2024 show improved anemia normal liver function test.  In the  summer 2024 BNP was normal.  Social - Husband suffered stroke and had carotid endarterectomy.  He is here with her today.  And currently is doing well. - She will have high-dose flu shot today.  - Esbriet /Pirfenidone  requires intensive drug monitoring due to high concerns for Adverse effects of , including  Drug Induced Liver Injury, significant GI side effects that include but not limited to Diarrhea, Nausea, Vomiting,  and other system side effects that include Fatigue, headaches, weight loss and other side effects such as skin rash. These will be monitored with  blood work such as LFT initially once a month for 6 months and then quarterly  CT Chest data from date: 08/06/23  - personally visualized and independently interpreted : yes - my findings are: as below  rrative & Impression  CLINICAL DATA:  Non-small cell lung cancer. Assess treatment response. * Tracking Code: BO *   EXAM: CT CHEST WITH CONTRAST   TECHNIQUE: Multidetector CT imaging of the chest was performed during intravenous contrast administration.   RADIATION DOSE REDUCTION: This exam was performed according to the departmental dose-optimization program which includes automated exposure control, adjustment of the mA and/or kV according to patient size and/or use of iterative reconstruction technique.   CONTRAST:  75mL OMNIPAQUE  IOHEXOL  300 MG/ML  SOLN   COMPARISON:  Chest CT 05/18/2023 and prior PET-CT 04/13/2023   FINDINGS: Cardiovascular: The heart is normal in size. No pericardial effusion. Scattered fluid in the pericardial recesses. Stable aortic and branch vessel calcifications but no dissection or aneurysm.   Mediastinum/Nodes: Stable borderline enlarged upper mediastinal lymph nodes. 7 mm node on image 34/2 is stable. Right paratracheal node on image 43/2 measures 9 mm and appears new. 9 mm precarinal lymph  node on image 58/2 is stable. 8 mm AP window node on the same image is also stable. 15 mm  subcarinal node on image 67/2 previously measured 10 mm.   Lungs/Pleura: Stable severe underlying lung disease with bullous emphysema and pulmonary scarring. Overall stable extensive radiation changes involving the right lung with extensive fibrosis and consolidation and bronchiectasis.   The right lower lobe pulmonary nodule is surrounded by soft tissue density, likely radiation change. Difficult to measure a discrete lesion. No new pulmonary nodules are identified.   Upper Abdomen: No significant upper abdominal findings. Stable vascular disease. Status post cholecystectomy.   Musculoskeletal: No breast masses, supraclavicular or axillary adenopathy. No lytic or sclerotic bone lesions are identified.   IMPRESSION: 1. Stable extensive radiation changes involving the right lung. The right lower lobe pulmonary nodule is surrounded by soft tissue density, likely radiation change. Difficult to measure a discrete lesion. No new pulmonary lesions. 2. Stable severe underlying emphysematous changes and pulmonary scarring. 3. New, slightly larger and stable mediastinal lymph nodes as above. 4. No findings for upper abdominal metastatic disease.   Aortic Atherosclerosis (ICD10-I70.0) and Emphysema (ICD10-J43.9).     Electronically Signed   By: MYRTIS Stammer M.D.   On: 08/06/2023 16:46    OV 10/22/2023  Subjective:  Patient ID: Diane [name], female , DOB: 03-09-1956 , age 81 y.o. , MRN: [id number] , ADDRESS: [address] [place] [address] PCP [name], MD Patient Care Team: [name], MD as PCP - General (Internal Medicine) Shaaron Lamar HERO, MD as Consulting Physician (Gastroenterology) Livingston Rigg, MD (Inactive) as Consulting Physician (Dermatology)  This Provider for this visit: Treatment Team:  Attending Provider: Geronimo Amel, MD    10/22/2023 -   Chief Complaint  Patient presents with   Follow-up    Review CT Chest done 09/25/23. Breathing  is stable. Denies any new co's.    Type of visit: Video Virtual Visit Identification of patient Diane [name] with 12-Nov-1955 and MRN [id number] - 2 person identifier Risks: Risks, benefits, limitations of telephone visit explained. Patient understood and verbalized agreement to proceed Anyone else on call: husband Patient location: her hime This provider location: [address]; [place]; [place] [address]. [facility]. [phone]   HPI Diane [name] 68 y.o. -on this video visit patient says she is actually doing around the same at 4 L oxygen  at rest and 10 L taking a shower but she does feel more short of breath with exertion she is no longer walking the dog for the last 1 month.  She does not think this is weather related.  She is taking her pirfenidone  at 2 pills 3 times a day [low-dose protocol] and is tolerating it well.  Liver function test recently was normal in December 2024.  She continues to have cough and I did do a refill for her Hycodan.  Conversation revolved around her recent CT scan.  She is getting CT scans every 2 months.  To me it looks stable except that she has ongoing radiation pneumonitis compared to it over a year ago.  6 months ago her ESR was high.  We discussed briefly that if the ESR persists perhaps we could try a course of steroids.  She is open to this idea.  We resolved that we will do a pulmonary function test in the next 8 weeks and reassess.  In addition it appears on the CT scan that I personally visualized that the  lung cancer itself is under remission.  Therefore we entertain the idea of evaluating for pulmonary hypertension.  Will get a BNP and echocardiogram and then decide on right heart catheterization which could be offered through research protocol [Dr. Hunsucker PI] versus standard of care.  She is open to the research protocol idea.  Will get an echocardiogram and BNP. Personally visualized the CT scan below. rrative  & Impression  CLINICAL DATA:  Staging non-small-cell lung cancer. * Tracking Code: BO *   EXAM: CT CHEST WITH CONTRAST   TECHNIQUE: Multidetector CT imaging of the chest was performed during intravenous contrast administration.   RADIATION DOSE REDUCTION: This exam was performed according to the departmental dose-optimization program which includes automated exposure control, adjustment of the mA and/or kV according to patient size and/or use of iterative reconstruction technique.   CONTRAST:  75mL OMNIPAQUE  IOHEXOL  300 MG/ML  SOLN   COMPARISON:  CT 07/31/2023 and older.   FINDINGS: Cardiovascular: Heart is nonenlarged. Trace pericardial fluid is stable. Coronary artery calcifications are seen. The thoracic aorta has a normal course and caliber with scattered partially calcified plaque. Bovine type aortic arch, normal variant.   Mediastinum/Nodes: Preserved thyroid gland. The thoracic esophagus has a normal course and caliber.   There are no abnormal lymph nodes seen in the axillary regions, thoracic inlet, chest wall or internal mammary chains. Prominent mediastinal nodes are again seen. Specific lesions will be followed such as pretracheal in the upper mediastinum which on the prior has a short axis dimension of 7 mm and today 8 mm on series 2, image 38. Right paratracheal node which measured 9 mm on the prior, today on series 2, image 48 measures 9 mm. Precarinal nodes are again seen as well. On the prior the right-sided focus measured 9 mm and left 8 mm in short axis. On today's study, image 62 of series 2, these measure 9 mm and 8 mm, respectively. Prominent nodes along the AP window, subcarinal region are also stable when adjusting for technique. Stable enlarged left hilar node as well on series 2, image 79.   Lungs/Pleura: Advanced emphysematous changes identified including centrilobular and paraseptal. Greatest in the upper lung zones. Left lung also has areas of  interstitial septal thickening, fibrosis and some honeycombing. No left-sided pleural effusion, consolidation or pneumothorax. No new left-sided dominant lung lesion. Stable 5 mm left-sided lung nodule series 6, image 87.   There is debris in the trachea and extending into the right main bronchus. Persistent consolidative opacity seen in the central right upper lobe with distortion and bronchiectasis, unchanged from prior. Consolidative opacities also in the superior segment of the left lower lobe has a similar configuration as well. Small loculated right pleural effusion is similar. Stable small right upper lobe nodule measuring 5 mm on series 2 image 36.   Upper Abdomen: Adrenal glands are incompletely included in the imaging field. The left adrenal gland and at the very edge of the imaging field to slightly thickened unchanged from previous. Right adrenal gland is preserved.   Musculoskeletal: Scattered degenerative changes along the spine.   IMPRESSION: Overall no significant interval change.   Stable consolidative opacities in the right lung with distortion and loculated small effusion. Stable small bilateral lung nodules.   Several borderline mediastinal and hilar nodes are similar to the most recent prior. Recommend continued surveillance.   Aortic Atherosclerosis (ICD10-I70.0) and Emphysema (ICD10-J43.9).     Electronically Signed   By: Ranell Bring M.D.   On: 10/04/2023  15:41      OV 02/13/2024  Subjective:  Patient ID: Diane [name], female , DOB: 1955-10-29 , age 18 y.o. 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  HPI Diane [name] 68 y.o. -returns for follow-up.  Presents with the husband.  She is actually feeling well.  She wants a refill on hydrocodone .  She had pulmonary function test that is stable for over 1 year.  Symptom score is also stable oxygen  use is stable.  Tolerating pirfenidone  quite well.  She had liver function test February 05, 2024 this is normal.  CT scan images were only available at the time she was here but after she left Dr. Yuvonne Eke I emailed her and she got back to me with the formal report.  Severe to call the patient afterwards.  According to Dr. Eke there is new right lower lobe infiltrate/inflammation.  Of note patient is interested in restarting pulmonary rehabilitation.  She is overall happy with the fact she is stable.    Husband is here with her.           OV 05/15/2024  Subjective:  Patient ID: Diane [name], female , DOB: December 29, 1955 , age 62 y.o. , MRN: [id number] , ADDRESS: [address] [place] [address] PCP [name], MD Patient Care Team: [name], MD as PCP - General (Internal Medicine) Shaaron Lamar HERO, MD as Consulting Physician (Gastroenterology) Livingston Rigg, MD as Consulting Physician (Dermatology)  This Provider for this visit: Treatment Team:  Attending Provider: Geronimo Amel, MD    05/15/2024 -   Chief Complaint  Patient presents with   Follow-up    68mo chronic resp. Failure f/u Wants to quicken prednisone  taper. Has a continued need for pirfenidone . Phlegm is tinged with blood.      HPI Diane [name] 68 y.o. -returns for follow-up.  Presents husband Diane.  Since I saw her in last April 2025 she has had dramatic decline in or overall respiratory status.  She came off a course of prednisone  in June 2025 and  then probably got hospitalized.  She had side effects from the prednisone .  She was also dealing with significant new onset right infra axillary and right inframammary pain that ultimately required Cymbalta  and duloxetine  from the primary care physician.  Unclear what was going on.  Lives back then in June 2025 discussed with hospitalist and put her on extended prednisone  taper for possible radiation pneumonitis and COPD flareup and ILD flareup.  She says the pain is much better controlled.  But she is having significant side effects of the steroid in terms of weight gain and Cushing's.  And also mood irritability.  She is not sure what is helping her.  She wants to taper off faster.  In addition hypoxemia is worse she is now using 6 L at rest and up to 10 L with exertion or more.  She did have a CT scan of the chest PET scan yesterday but the  results are pending.  On the right lower lobe that seems to be higher uptake.  She is frustrated by all of this.  She now is home hospice support to give increased level of support and with the chaplain and social worker and increased medical attention things are better for her emotionally and physically.  She continues pirfenidone .  She is really perplexed by her decline especially with hypoxemia.  Earlier in the year echocardiogram did not show any evidence of pulmonary hypertension but she never had right heart catheterization.  The PET scan results are still pending.  She has upcoming appoint with Dr. Sherrod the oncologist.  She has baseline anemia.  We checked it today and is unchanged.    SYMPTOM SCALE - ILD 11/13/2022 03/22/2023 4L [place] rest, 10-15L [place] exertiona 08/07/2023 75% on room air at rest.  Uses 4 L nasal cannula at rest and 8-10 L with exercise.4 02/13/2024 On oxygen .4L rest 05/15/2024 On 6L rest, 10L exetion, esbreit, slow pred taper  Current weight       O2 use Exertional walking the dog.      Shortness of Breath 0 -> 5 scale with 5 being worst  (score 6 If unable to do)      At rest 0 0 4 0 0  Simple tasks - showers, clothes change, eating, shaving 3 4 3 5 5   Household (dishes, doing bed, laundry) 3 4 3 3 5   Shopping 2 4 3 3 5   Walking level at own pace 4 4 3 3 5   Walking up Stairs 4 5 4  na 5  Total (30-36) Dyspnea Score 16 21 17 14 25       Non-dyspnea symptoms (0-> 5 scale) 11/13/2022 03/22/2023  08/07/2023  02/13/2024  05/15/2024   How bad is your cough? 2 -cough is better after quitting smoking 2 3 3 1   How bad is your fatigue 2 4 3 4 4   How bad is nausea 0 2 0 0 0  How bad is vomiting?  0 0 0 0 0  How bad is diarrhea? 0 0 0 1 0  How bad is anxiety? 3 2 3 5 5   How bad is depression 1 2 3 3 4   Any chronic pain - if so where and how bad 0 x  0 On cymbalta  and gabapentin     Simple office walk 185 feet x  3 laps goal with forehead probe 12/26/2022  12/26/2022   O2 used RA   Number laps completed 1/2 lap of 1 ap   Comments about pace normal   Resting Pulse Ox/HR 94% and 90/min   Final Pulse Ox/HR 87% and 90/min   Desaturated </= 88% yes   Desaturated <= 3% points yes   Got Tachycardic >/= 90/min yes   Symptoms at end of test dyspnea   Miscellaneous comments Waked on 5L [place] - 96% sitting -> still deats Needed 8-10L to correct and walk all 3 laps      PFT     Latest Ref Rng & Units 02/13/2024    1:43 PM 03/21/2023    2:53 PM 12/26/2022    1:53 PM 01/04/2022    9:51 AM  PFT Results  FVC-Pre L 2.00  1.85  2.10  2.54   FVC-Predicted Pre % 59  54  64  76   FVC-Post L   2.11  2.69   FVC-Predicted Post %   64  81   Pre FEV1/FVC % % 80  79  83  73   Post FEV1/FCV % %   81  71   FEV1-Pre L 1.59  1.47  1.74  1.84   FEV1-Predicted Pre % 61  56  69  72   FEV1-Post L   1.70  1.91   DLCO uncorrected ml/min/mmHg 6.40  2.82  6.99  9.15   DLCO UNC% % 30  13  34  44   DLCO corrected ml/min/mmHg 6.92  3.04  6.99  9.15   DLCO COR %Predicted % 32  14  34  44   DLVA Predicted % 52  45  55  55   TLC L   3.68  3.94   TLC %  Predicted %   70  75   RV % Predicted %   74  62        LAB RESULTS last 96 hours No results found.       has a past medical history of Anxiety, Arthritis, C. difficile diarrhea (2015), COPD (chronic obstructive pulmonary disease) (HCC), Depression, Eczema, H pylori ulcer, Headache(784.0), Hypercholesterolemia, Hypertension, Interstitial lung disease (HCC), Kidney stones, lung ca with recurrence (10/2021), Pneumonia, Seizures (HCC), and Shingles.   reports that she quit smoking about 2 years ago. Her smoking use included cigarettes. She started smoking about 32 years ago. She has a 15 pack-year smoking history. She has never used smokeless tobacco.  Past Surgical History:  Procedure Laterality Date   ABDOMINAL HYSTERECTOMY     ADENOIDECTOMY     APPENDECTOMY     BRONCHIAL BIOPSY  12/20/2021   Procedure: BRONCHIAL BIOPSIES;  Surgeon: Brenna Adine CROME, DO;  Location: [facility];  Service: Pulmonary;;   BRONCHIAL BRUSHINGS  12/20/2021   Procedure: BRONCHIAL BRUSHINGS;  Surgeon: Brenna Adine CROME, DO;  Location: [facility];  Service: Pulmonary;;   BRONCHIAL NEEDLE ASPIRATION BIOPSY  12/20/2021   Procedure: BRONCHIAL NEEDLE ASPIRATION BIOPSIES;  Surgeon: Brenna Adine CROME, DO;  Location: [facility];  Service: Pulmonary;;   CESAREAN SECTION     CHOLECYSTECTOMY     COLONOSCOPY  06/10/2012   RMR: Colonic polyps -removed as described above.    FINE NEEDLE ASPIRATION  12/20/2021   Procedure: FINE NEEDLE ASPIRATION (FNA) LINEAR;  Surgeon: Brenna Adine CROME, DO;  Location: [facility];  Service: Pulmonary;;   KNEE ARTHROSCOPY  1973   right knee,  torn cart   LUMBAR DISC SURGERY  05/12/2015   L4 L5   LUMBAR LAMINECTOMY/DECOMPRESSION MICRODISCECTOMY Left 05/12/2015   Procedure: Left L4-5 Microdiscectomy;  Surgeon: Oneil JAYSON Herald, MD;  Location: [facility];  Service: Orthopedics;  Laterality: Left;   TONSILLECTOMY     VIDEO BRONCHOSCOPY WITH ENDOBRONCHIAL ULTRASOUND Bilateral 12/20/2021   Procedure:  VIDEO BRONCHOSCOPY WITH ENDOBRONCHIAL ULTRASOUND;  Surgeon: Brenna Adine CROME, DO;  Location: [facility];  Service: Pulmonary;  Laterality: Bilateral;   VIDEO BRONCHOSCOPY WITH RADIAL ENDOBRONCHIAL ULTRASOUND  12/20/2021   Procedure: RADIAL ENDOBRONCHIAL ULTRASOUND;  Surgeon: Brenna Adine CROME, DO;  Location: [facility];  Service: Pulmonary;;    Allergies  Allergen Reactions   Penicillins Shortness Of Breath   Alpha-Gal Other (See Comments)    Abdominal Pain All four legged mammals; pork, beef, lamb, deer, etc.    Immunization History  Administered Date(s) Administered   Fluad Quad(high Dose 65+) 08/08/2022   Influenza, High Dose Seasonal PF 08/07/2023   Influenza, Quadrivalent, Recombinant, Inj, Pf 07/24/2019   Influenza,inj,Quad PF,6+ Mos 07/15/2016, 08/03/2017, 08/29/2018   Influenza-Unspecified 07/16/2021   Moderna Sars-Covid-2 Vaccination 12/25/2019, 01/24/2020   PNEUMOCOCCAL  CONJUGATE-20 08/08/2022   Pneumococcal Polysaccharide-23 09/29/2013   RSV,unspecified 09/14/2022    Family History  Problem Relation Age of Onset   Arthritis Mother    Asthma Mother    Depression Mother    Hyperlipidemia Mother    Varicose Veins Mother    Arthritis Father    Heart disease Father    Hyperlipidemia Father    Vision loss Father    Cancer Sister    COPD Sister    Early death Sister    Cancer Brother    Alcohol abuse Maternal Grandfather    Colon cancer Neg Hx      Current Outpatient Medications:    acetaminophen  (TYLENOL ) 325 MG tablet, Take 2 tablets (650 mg total) by mouth every 4 (four) hours as needed for mild pain (pain score 1-3) or fever (or Fever >/= 101)., Disp: , Rfl:    albuterol  (PROVENTIL ) (2.5 MG/3ML) 0.083% nebulizer solution, Take 3 mLs (2.5 mg total) by nebulization every 2 (two) hours as needed for wheezing or shortness of breath., Disp: 75 mL, Rfl: 12   DULoxetine  (CYMBALTA ) 30 MG capsule, Take 30 mg by mouth daily., Disp: , Rfl:    gabapentin  (NEURONTIN ) 100  MG capsule, Take 200 mg by mouth 3 (three) times daily., Disp: , Rfl:    guaiFENesin  (MUCINEX ) 600 MG 12 hr tablet, Take 1 tablet (600 mg total) by mouth 2 (two) times daily., Disp: 60 tablet, Rfl: 2   HYDROcodone  bit-homatropine (HYCODAN) 5-1.5 MG/5ML syrup, Take 5 mLs by mouth every 6 (six) hours as needed for cough., Disp: 240 mL, Rfl: 0   loratadine  (CLARITIN ) 10 MG tablet, Take 10 mg by mouth daily., Disp: , Rfl:    Nebulizers (COMPRESSOR/NEBULIZER) MISC, 1 Units by Does not apply route daily as needed., Disp: 1 each, Rfl: 0   omeprazole  (PRILOSEC) 20 MG capsule, Take 1 capsule (20 mg total) by mouth 2 (two) times daily. (Needs to be seen before next refill) (Patient taking differently: Take 20 mg by mouth at bedtime. (Needs to be seen before next refill)), Disp: 60 capsule, Rfl: 0   Pirfenidone  (ESBRIET ) 267 MG TABS, Take 3 tablets (801 mg total) by mouth 3 (three) times daily with meals. (Account: [email or web address]) (Patient taking differently: Take 534 mg by mouth 3 (three) times daily with meals. (Account: [email or web address])), Disp: 270 tablet, Rfl: 5   Polyethyl Glycol-Propyl Glycol 0.4-0.3 % SOLN, Place 1 drop into both eyes in the morning and at bedtime., Disp: , Rfl:    predniSONE  (DELTASONE ) 10 MG tablet, Take 1 tablet (10 mg total) by mouth See admin instructions. Take prednisone  60mg  daily x 2 weeks and then  40 mg daily x 2 weeks then prednisone  30mg  daily x 2 weeks, then 20mg  daily x 2 weeks then 10mg  daily x 2 weeks, then 5mg  daily x 2 weeeks and then 5mg  On MWF x 2 weeks and stop, Disp: 321.42 tablet, Rfl: 1   Probiotic Product (PROBIOTIC DAILY PO), Take 1 capsule by mouth daily., Disp: , Rfl:    rosuvastatin  (CRESTOR ) 10 MG tablet, Take 10 mg by mouth at bedtime., Disp: , Rfl:    saline (AYR) GEL, Place 1 Application into both nostrils in the morning and at bedtime., Disp: , Rfl:    traZODone  (DESYREL ) 100 MG tablet, Take 1 tablet (100 mg total) by mouth at  bedtime. For Sleep, Disp: 30 tablet, Rfl: 4   lidocaine  (LIDODERM ) 5 %, Place 1 patch onto the skin daily. Remove & Discard patch within  12 hours or as directed by MD (Patient not taking: Reported on 05/15/2024), Disp: 30 patch, Rfl: 0      Objective:   Vitals:   05/15/24 1431  BP: 124/66  Pulse: 94  Temp: 97.9 F (36.6 C)  TempSrc: Oral  SpO2: 98%  Weight: 149 lb (67.6 kg)  Height: 5' 6 (1.676 m)    Estimated body mass index is 24.05 kg/m as calculated from the following:   Height as of this encounter: 5' 6 (1.676 m).   Weight as of this encounter: 149 lb (67.6 kg).  @WEIGHTCHANGE @  [employer]   05/15/24 1431  Weight: 149 lb (67.6 kg)     Physical Exam   General: No distress. cushingoind O2 at rest: yes 6L Cane present: no Sitting in wheel chair: no Frail: yes but also cushingoid Obese: no Neuro: Alert and Oriented x 3. GCS 15. Speech normal Psych: Pleasant Resp:  Barrel Chest - no.  Wheeze - no, Crackles - yes, No overt respiratory distress CVS: Normal heart sounds. Murmurs - no Ext: Stigmata of Connective Tissue Disease - no HEENT: Normal upper airway. PEERL +. No post nasal drip        Assessment/     Assessment & Plan Chronic cough  Chronic respiratory failure with hypoxia (HCC)  IPF (idiopathic pulmonary fibrosis) (HCC)  Pulmonary emphysema, unspecified emphysema type (HCC)  Radiation pneumonitis (HCC)  Dyspnea, unspecified type  History of anemia  Current chronic use of systemic steroids  Cancer associated pain    PLAN Patient Instructions  IPF (idiopathic pulmonary fibrosis) (HCC) with Radiation Fibrosis Pulmonary emphysema, unspecified emphysema type (HCC) History of lung cancer- stgage 2 b with recurrence right lower lobe June 2024 Chronic respiratory failure with hypoxia (HCC)   - Dramatic worsneing of respiratory failure in Jun 2025 of unclear reason - - no evidence of pulmonary hypertension on echo early 2025 but  might have developed it byt npow - s/p radiation to lung endingl August 2024 - Severe cough due to fibrosis and lung cancer Hycodan helping - PET Scan results pending  Plan - For respiratory failure  - -Continue oxygen  6 L at rest [likely need 4 L nasal cannula at night as well] and use 8-12 L with exertion  -For cough  - continue Hycodan 5 mL twice daily with hospice   - for anemia followup  - check cbc 05/15/2024   - continue esbriet  (pirfenidone ) as before; low dose protocol  - check LFT 05/15/2024   - for associated emphysema  -continue spiriva  - await formal radiology report  - will ask radiologist if there is potential role for steroids for the radiation pneumniitisi  - for cancer recurrence - awiat PET scan results   -for possible radiation pneumonitis   - noted steroid side effect with Cushing's weight gain and irritability   - Change prednisone  to 20 mg once daily x 1 week and then 10 mg once daily x 1 week and then 5 mg once daily x 1 week and then 5 mg Monday Wednesday Friday x 1 week and then 5 mg on Monday x 1 week and stop   - rule out Pulmonary hypertension   - Recommend right heart catheterization if PET scan does not reveal any cancer recurrence  - Please let us  know after your oncology visit or after your PET scan results of formal so I can make a referral to cardiology  - cancer pain  - treatment by PCP   -  overall symptms  - contiue home hospice care  Plan = 12 weeks x 2-minute visit with Dr. Geronimo BOSCH    Return in about 3 months (around 08/15/2024) for 30 min visit, with Dr Geronimo, Face to Face Visit.  ( Level 05 visit E&M 2024: Estb >= 40 min visit type: on-site physical face to visit  in total care time and counseling or/and coordination of care by this undersigned MD - Dr Dorethia Geronimo. This includes one or more of the following on this same day 05/15/2024: pre-charting, chart review, note writing, documentation  discussion of test results, diagnostic or treatment recommendations, prognosis, risks and benefits of management options, instructions, education, compliance or risk-factor reduction. It excludes time spent by the CMA or office staff in the care of the patient. Actual time 45 min)   SIGNATURE    Dr. Dorethia Geronimo, M.D., F.C.C.P,  Pulmonary and Critical Care Medicine Staff Physician, [facility] Director - Interstitial Lung Disease  Program  Pulmonary Fibrosis [facility] [place], [place], [address]  Pager: [phone], If no answer or between  15:00h - 7:00h: call [phone] Telephone: [phone]  5:36 PM 05/15/2024

## 2024-05-20 ENCOUNTER — Ambulatory Visit (HOSPITAL_COMMUNITY)

## 2024-05-22 ENCOUNTER — Ambulatory Visit (HOSPITAL_COMMUNITY)

## 2024-05-27 ENCOUNTER — Ambulatory Visit (HOSPITAL_COMMUNITY)

## 2024-05-29 ENCOUNTER — Inpatient Hospital Stay: Attending: Internal Medicine | Admitting: Internal Medicine

## 2024-05-29 VITALS — BP 116/65 | HR 76 | Temp 97.9°F | Resp 17 | Ht 66.0 in | Wt 152.0 lb

## 2024-05-29 DIAGNOSIS — Z79899 Other long term (current) drug therapy: Secondary | ICD-10-CM | POA: Diagnosis not present

## 2024-05-29 DIAGNOSIS — K219 Gastro-esophageal reflux disease without esophagitis: Secondary | ICD-10-CM | POA: Insufficient documentation

## 2024-05-29 DIAGNOSIS — Z923 Personal history of irradiation: Secondary | ICD-10-CM | POA: Diagnosis not present

## 2024-05-29 DIAGNOSIS — C3490 Malignant neoplasm of unspecified part of unspecified bronchus or lung: Secondary | ICD-10-CM

## 2024-05-29 DIAGNOSIS — G629 Polyneuropathy, unspecified: Secondary | ICD-10-CM | POA: Diagnosis not present

## 2024-05-29 DIAGNOSIS — R519 Headache, unspecified: Secondary | ICD-10-CM | POA: Diagnosis not present

## 2024-05-29 DIAGNOSIS — Z9221 Personal history of antineoplastic chemotherapy: Secondary | ICD-10-CM | POA: Diagnosis not present

## 2024-05-29 DIAGNOSIS — Z85118 Personal history of other malignant neoplasm of bronchus and lung: Secondary | ICD-10-CM | POA: Insufficient documentation

## 2024-05-29 NOTE — Progress Notes (Signed)
 [facility] [phone]   [phone]  OFFICE PROGRESS NOTE  Diane Leta NOVAK, MD [address] [place] [address]  DIAGNOSIS: Unresectable stage IIB (T2a, N1, M0) non-small cell lung cancer, squamous cell carcinoma presented with right lower lobe lung mass in addition to right hilar lymphadenopathy diagnosed in March 2023.    The patient had PD-L1 expression of 99%.  PRIOR THERAPY: A course of concurrent chemoradiation with weekly carboplatin for AUC of 2 and paclitaxel 45 Mg/M2.  First dose started 01/16/2022.  Status post 7 cycles.  Last dose was given on 02/27/2022.  CURRENT THERAPY: Observation.  INTERVAL HISTORY: Diane [name] 68 y.o. female returns to the clinic today for follow-up visit accompanied by her husband.Discussed the use of AI scribe software for clinical note transcription with the patient, who gave verbal consent to proceed.  History of Present Illness Diane [name] is a 68 year old female with unresectable stage 2B non-small cell lung cancer who presents for evaluation and discussion of her recent PET scan results. She is accompanied by her husband. She has a history of unresectable stage 2B non-small cell lung cancer, diagnosed in March 2023, with a positive PD-L1 expression of 99%. She completed a course of concurrent chemoradiation with weekly carboplatin and paclitaxel in May 2023 and has been under observation since then. Recent imaging studies showed chronic post-radiation changes and scarring in the right lung, with recurrent disease not excluded.  She has an increased oxygen requirement, currently using six liters at rest. Despite this, she notes 'good airflow' but questions where the oxygen is going. She has been under hospice care due to breathing issues and was hospitalized for these concerns.  She experiences severe pain, leading to two ER visits and hospitalization. She is being treated with gabapentin for  radiation-induced neuropathy. She also reports new epigastric pain, which could be related to lymph nodes or acid reflux, for which she takes omeprazole daily.  She experiences daily headaches, which are new and severe, waking her up. She also reports a sore throat, which may be related to her high oxygen use.  Her current medications include gabapentin for neuropathy and omeprazole for acid reflux. She has been on a high dose of steroids, previously at 60 mg daily, and is currently tapering down after gaining 30 pounds during hospitalization.      MEDICAL HISTORY: Past Medical History:  Diagnosis Date   Anxiety    Arthritis    fingers, left foot   C. difficile diarrhea 2015   COPD (chronic obstructive pulmonary disease) (HCC)    Depression    Eczema    H pylori ulcer    Headache(784.0)    Hypercholesterolemia    Hypertension    Interstitial lung disease (HCC)    Kidney stones    20 years ago   lung ca with recurrence 10/2021   01/2023   Pneumonia    5 years ago   Seizures (HCC)    2 years ago, cluster of seizures none since   Shingles     ALLERGIES:  is allergic to penicillins and alpha-gal.  MEDICATIONS:  Current Outpatient Medications  Medication Sig Dispense Refill   acetaminophen (TYLENOL) 325 MG tablet Take 2 tablets (650 mg total) by mouth every 4 (four) hours as needed for mild pain (pain score 1-3) or fever (or Fever >/= 101).     albuterol (PROVENTIL) (2.5 MG/3ML) 0.083% nebulizer solution Take 3 mLs (2.5 mg total) by nebulization  every 2 (two) hours as needed for wheezing or shortness of breath. 75 mL 12   DULoxetine (CYMBALTA) 30 MG capsule Take 30 mg by mouth daily.     gabapentin (NEURONTIN) 100 MG capsule Take 200 mg by mouth 3 (three) times daily.     guaiFENesin (MUCINEX) 600 MG 12 hr tablet Take 1 tablet (600 mg total) by mouth 2 (two) times daily. 60 tablet 2   HYDROcodone bit-homatropine (HYCODAN) 5-1.5 MG/5ML syrup Take 5 mLs by mouth every 6 (six)  hours as needed for cough. 240 mL 0   lidocaine (LIDODERM) 5 % Place 1 patch onto the skin daily. Remove & Discard patch within 12 hours or as directed by MD (Patient not taking: Reported on 05/15/2024) 30 patch 0   loratadine (CLARITIN) 10 MG tablet Take 10 mg by mouth daily.     Nebulizers (COMPRESSOR/NEBULIZER) MISC 1 Units by Does not apply route daily as needed. 1 each 0   omeprazole (PRILOSEC) 20 MG capsule Take 1 capsule (20 mg total) by mouth 2 (two) times daily. (Needs to be seen before next refill) (Patient taking differently: Take 20 mg by mouth at bedtime. (Needs to be seen before next refill)) 60 capsule 0   Pirfenidone (ESBRIET) 267 MG TABS Take 3 tablets (801 mg total) by mouth 3 (three) times daily with meals. (Account: [email or web address]) (Patient taking differently: Take 534 mg by mouth 3 (three) times daily with meals. (Account: [email or web address])) 270 tablet 5   Polyethyl Glycol-Propyl Glycol 0.4-0.3 % SOLN Place 1 drop into both eyes in the morning and at bedtime.     predniSONE (DELTASONE) 10 MG tablet Take 1 tablet (10 mg total) by mouth See admin instructions. Take prednisone 60mg  daily x 2 weeks and then  40 mg daily x 2 weeks then prednisone 30mg  daily x 2 weeks, then 20mg  daily x 2 weeks then 10mg  daily x 2 weeks, then 5mg  daily x 2 weeeks and then 5mg  On MWF x 2 weeks and stop 321.42 tablet 1   Probiotic Product (PROBIOTIC DAILY PO) Take 1 capsule by mouth daily.     rosuvastatin (CRESTOR) 10 MG tablet Take 10 mg by mouth at bedtime.     saline (AYR) GEL Place 1 Application into both nostrils in the morning and at bedtime.     traZODone (DESYREL) 100 MG tablet Take 1 tablet (100 mg total) by mouth at bedtime. For Sleep 30 tablet 4   No current facility-administered medications for this visit.    SURGICAL HISTORY:  Past Surgical History:  Procedure Laterality Date   ABDOMINAL HYSTERECTOMY     ADENOIDECTOMY     APPENDECTOMY     BRONCHIAL BIOPSY   12/20/2021   Procedure: BRONCHIAL BIOPSIES;  Surgeon: Brenna Adine CROME, DO;  Location: [facility];  Service: Pulmonary;;   BRONCHIAL BRUSHINGS  12/20/2021   Procedure: BRONCHIAL BRUSHINGS;  Surgeon: Brenna Adine CROME, DO;  Location: [facility];  Service: Pulmonary;;   BRONCHIAL NEEDLE ASPIRATION BIOPSY  12/20/2021   Procedure: BRONCHIAL NEEDLE ASPIRATION BIOPSIES;  Surgeon: Brenna Adine CROME, DO;  Location: [facility];  Service: Pulmonary;;   CESAREAN SECTION     CHOLECYSTECTOMY     COLONOSCOPY  06/10/2012   RMR: Colonic polyps -removed as described above.    FINE NEEDLE ASPIRATION  12/20/2021   Procedure: FINE NEEDLE ASPIRATION (FNA) LINEAR;  Surgeon: Brenna Adine CROME, DO;  Location: [facility];  Service: Pulmonary;;   KNEE ARTHROSCOPY  1973   right knee,  torn cart  LUMBAR DISC SURGERY  05/12/2015   L4 L5   LUMBAR LAMINECTOMY/DECOMPRESSION MICRODISCECTOMY Left 05/12/2015   Procedure: Left L4-5 Microdiscectomy;  Surgeon: Oneil JAYSON Herald, MD;  Location: [facility] OR;  Service: Orthopedics;  Laterality: Left;   TONSILLECTOMY     VIDEO BRONCHOSCOPY WITH ENDOBRONCHIAL ULTRASOUND Bilateral 12/20/2021   Procedure: VIDEO BRONCHOSCOPY WITH ENDOBRONCHIAL ULTRASOUND;  Surgeon: Brenna Adine CROME, DO;  Location: [facility];  Service: Pulmonary;  Laterality: Bilateral;   VIDEO BRONCHOSCOPY WITH RADIAL ENDOBRONCHIAL ULTRASOUND  12/20/2021   Procedure: RADIAL ENDOBRONCHIAL ULTRASOUND;  Surgeon: Brenna Adine CROME, DO;  Location: [facility];  Service: Pulmonary;;    REVIEW OF SYSTEMS:  Constitutional: positive for fatigue Eyes: negative Ears, nose, mouth, throat, and face: negative Respiratory: positive for cough, dyspnea on exertion, and pleurisy/chest pain Cardiovascular: negative Gastrointestinal: negative Genitourinary:negative Integument/breast: negative Hematologic/lymphatic: negative Musculoskeletal:negative Neurological: negative Behavioral/Psych: negative Endocrine: negative Allergic/Immunologic:  negative   PHYSICAL EXAMINATION: General appearance: alert, cooperative, fatigued, and no distress Head: Normocephalic, without obvious abnormality, atraumatic Neck: no adenopathy, no JVD, supple, symmetrical, trachea midline, and thyroid not enlarged, symmetric, no tenderness/mass/nodules Lymph nodes: Cervical, supraclavicular, and axillary nodes normal. Resp: rales bilaterally Back: symmetric, no curvature. ROM normal. No CVA tenderness. Cardio: regular rate and rhythm, S1, S2 normal, no murmur, click, rub or gallop GI: soft, non-tender; bowel sounds normal; no masses,  no organomegaly Extremities: extremities normal, atraumatic, no cyanosis or edema Neurologic: Alert and oriented X 3, normal strength and tone. Normal symmetric reflexes. Normal coordination and gait  ECOG PERFORMANCE STATUS: 1 - Symptomatic but completely ambulatory  Blood pressure 116/65, pulse 76, temperature 97.9 F (36.6 C), temperature source Temporal, resp. rate 17, height 5' 6 (1.676 m), weight 152 lb (68.9 kg), SpO2 100%.  LABORATORY DATA: Lab Results  Component Value Date   WBC 11.4 (H) 05/15/2024   HGB 10.1 (L) 05/15/2024   HCT 30.6 (L) 05/15/2024   MCV 93.6 05/15/2024   PLT 413.0 (H) 05/15/2024      Chemistry      Component Value Date/Time   NA 135 03/27/2024 1345   NA 139 12/03/2019 0928   K 3.9 03/27/2024 1345   CL 102 03/27/2024 1345   CO2 23 03/27/2024 1345   BUN 15 03/27/2024 1345   BUN 14 12/03/2019 0928   CREATININE 0.57 03/27/2024 1345   CREATININE 0.63 02/05/2024 1012   CREATININE 0.64 10/14/2021 0953      Component Value Date/Time   CALCIUM 9.3 03/27/2024 1345   ALKPHOS 52 05/15/2024 1519   AST 13 05/15/2024 1519   AST 32 02/05/2024 1012   ALT 13 05/15/2024 1519   ALT 31 02/05/2024 1012   BILITOT 0.3 05/15/2024 1519   BILITOT 0.3 02/05/2024 1012       RADIOGRAPHIC STUDIES: NM PET Image Restage (PS) Skull Base to Thigh (F-18 FDG) Result Date: 05/20/2024 CLINICAL DATA:   Subsequent treatment strategy for non-small cell lung cancer of right lower lobe EXAM: NUCLEAR MEDICINE PET SKULL BASE TO THIGH TECHNIQUE: 7.07 mCi F-18 FDG was injected intravenously. Full-ring PET imaging was performed from the skull base to thigh after the radiotracer. CT data was obtained and used for attenuation correction and anatomic localization. Fasting blood glucose: 89 mg/dl COMPARISON:  April 05, 2023, CT angio chest March 27, 2024 head CT FINDINGS: Mediastinal blood pool activity: SUV max 2.4 Liver activity: SUV max 3.6 NECK: Right level 4 hypermetabolic 12 mm lymph node max SUV 3.9, previously subcentimeter non FDG avid (image 47). Incidental CT findings: None. CHEST: *  There is a hypermetabolic mass/consolidation involving the right upper lobe central aspect measuring with max SUV 22.5, new to prior PET-CT and similar to prior CT. *Left lower lobe subpleural nodule with max SUV 7 (image 83), new to prior. *Near complete resolution of hypermetabolic consolidation involving right upper lobe. *New contralateral left hilar hypermetabolic lymph node max SUV 14.5 increased metabolic activity of right hilar lymph node max SUV 6. *Anterior mediastinal and right paratracheal subcentimeter mildly hypermetabolic lymph nodes with increased FDG uptake. Persistent upper lobe predominant centrilobular emphysematous changes, interlobular septal thickening, fibrotic changes and ground-glass attenuation . Atherosclerotic calcifications of coronary arteries. Mild cardiomegaly. Trace right pleural effusion is suspected. ABDOMEN/PELVIS: No suspicious findings to suggest distant metastatic disease within the abdomen and pelvis. Incidental CT findings: Hysterectomy. Colonic diverticulosis. Cholecystectomy. Hiatal hernia. SKELETON: No suspicious osseous metastasis. Incidental CT findings: None. IMPRESSION: Multifocal areas of new metabolic activity and masslike consolidations as detail above concerning for new foci of recurrent  malignancy/metastatic disease versus infection/inflammation as opposed to a focal activity in right lung base which is now resolved. New left lower lobe subpleural pulmonary nodule Bilateral hilar lymphadenopathy and right level 4 cervical lymph nodes, new or progressed to prior. Constellation of findings are concerning for progressive malignancy until proven otherwise. Superimposing infection/inflammation is in differential diagnosis. Follow-up to ensure resolution. Otherwise no extra thoracic new site of metastatic disease. Electronically Signed   By: Megan  Zare M.D.   On: 05/20/2024 18:13    ASSESSMENT AND PLAN: This is a very pleasant 68  years old white female diagnosed with unresectable stage IIb (T2 a, N1, M0) non-small cell lung cancer, squamous cell carcinoma with right lower lobe lung mass in addition to right hilar lymphadenopathy in March 2023. The patient has PD-L1 expression of 99%. She completed a course of concurrent chemoradiation with weekly carboplatin for AUC of 2 and paclitaxel 45 Mg/M2 status post 7 cycles.  Last dose was given on 02/27/2022. The patient is currently on observation and she is feeling fine except for the baseline shortness of breath and fatigue. She had a PET scan performed recently that showed multifocal areas of new metabolic activity and mass consolidation concerning for new foci of recurrent malignancy/metastatic disease versus infection and inflammation. Assessment and Plan Assessment & Plan Unresectable stage IIB non-small cell lung cancer, right lung, status post chemoradiation, possible recurrence Recent PET scan shows suspicious areas in the right lung, neck, and right hilar lymph nodes, raising concern for disease recurrence. Differential diagnosis includes inflammation versus cancer recurrence. High PD-L1 expression (99%) suggests potential benefit from immunotherapy if recurrence is confirmed. Currently under hospice care, which precludes active treatment  unless she opts out. The goal of treatment, if pursued, would be to improve breathing, not cure the disease. - Order bronchoscopy to biopsy suspicious areas to confirm or exclude cancer recurrence. - Discuss with pulmonologist Dr. Tonna regarding performing the bronchoscopy or referring to a partner. - Discuss potential treatment options if recurrence is confirmed, including immunotherapy, with the goal of improving breathing. - Discuss the possibility of coming off hospice care if active treatment is pursued.  Chronic post-radiation changes and scarring, right lung Chronic post-radiation changes and scarring noted in the right lung. Difficult to distinguish from potential cancer recurrence without biopsy.  Hypoxemic respiratory failure requiring supplemental oxygen Requires 6 liters of oxygen at rest. Oxygen therapy may contribute to sore throat. Potential for improved breathing if cancer recurrence is treated.  Radiation-induced neuropathy Experiencing pain likely related to radiation-induced neuropathy. Currently  managed with gabapentin.  Gastric pain, multifactorial etiology Experiencing gastric pain, possibly related to lymph nodes, acid reflux, or other causes. Currently taking omeprazole.  Headache, new onset, possible brain metastasis New onset daily headaches. Potential concern for brain metastasis given PET scan findings. - Order MRI of the brain to evaluate for possible metastasis.  Sore throat likely secondary to oxygen therapy Sore throat likely due to dryness from high-flow oxygen therapy. The patient was advised to call immediately if she has any concerning symptoms in the interval. The patient voices understanding of current disease status and treatment options and is in agreement with the current care plan.  All questions were answered. The patient knows to call the clinic with any problems, questions or concerns. We can certainly see the patient much sooner if  necessary.  The total time spent in the appointment was 30 minutes.  Disclaimer: This note was dictated with voice recognition software. Similar sounding words can inadvertently be transcribed and may not be corrected upon review.

## 2024-05-30 ENCOUNTER — Telehealth: Payer: Self-pay | Admitting: Internal Medicine

## 2024-05-30 NOTE — Telephone Encounter (Signed)
 Scheduled appointments per 8/14 LOS, called patient she is aware.

## 2024-06-06 ENCOUNTER — Ambulatory Visit (HOSPITAL_COMMUNITY)
Admission: RE | Admit: 2024-06-06 | Discharge: 2024-06-06 | Disposition: A | Discharge status: Home / Self Care | Source: Ambulatory Visit | Attending: Internal Medicine | Admitting: Internal Medicine

## 2024-06-06 DIAGNOSIS — C3490 Malignant neoplasm of unspecified part of unspecified bronchus or lung: Secondary | ICD-10-CM | POA: Insufficient documentation

## 2024-06-06 MED ORDER — GADOBUTROL 1 MMOL/ML IV SOLN
7.0000 mL | Freq: Once | INTRAVENOUS | Status: AC | PRN
  Administered 2024-06-06: 7 mL via INTRAVENOUS

## 2024-06-11 ENCOUNTER — Telehealth: Payer: Self-pay | Admitting: Acute Care

## 2024-06-11 ENCOUNTER — Encounter: Payer: Self-pay | Admitting: Acute Care

## 2024-06-11 ENCOUNTER — Ambulatory Visit (INDEPENDENT_AMBULATORY_CARE_PROVIDER_SITE_OTHER): Admitting: Acute Care

## 2024-06-11 VITALS — BP 112/68 | HR 86 | Temp 98.0°F | Ht 65.0 in | Wt 150.4 lb

## 2024-06-11 DIAGNOSIS — C3431 Malignant neoplasm of lower lobe, right bronchus or lung: Secondary | ICD-10-CM

## 2024-06-11 DIAGNOSIS — R911 Solitary pulmonary nodule: Secondary | ICD-10-CM

## 2024-06-11 DIAGNOSIS — J9611 Chronic respiratory failure with hypoxia: Secondary | ICD-10-CM

## 2024-06-11 DIAGNOSIS — R053 Chronic cough: Secondary | ICD-10-CM

## 2024-06-11 DIAGNOSIS — R591 Generalized enlarged lymph nodes: Secondary | ICD-10-CM

## 2024-06-11 DIAGNOSIS — Z85118 Personal history of other malignant neoplasm of bronchus and lung: Secondary | ICD-10-CM

## 2024-06-11 DIAGNOSIS — M792 Neuralgia and neuritis, unspecified: Secondary | ICD-10-CM

## 2024-06-11 DIAGNOSIS — J849 Interstitial pulmonary disease, unspecified: Secondary | ICD-10-CM

## 2024-06-11 DIAGNOSIS — R918 Other nonspecific abnormal finding of lung field: Secondary | ICD-10-CM

## 2024-06-11 DIAGNOSIS — Z8709 Personal history of other diseases of the respiratory system: Secondary | ICD-10-CM

## 2024-06-11 DIAGNOSIS — Z87891 Personal history of nicotine dependence: Secondary | ICD-10-CM

## 2024-06-11 DIAGNOSIS — J84112 Idiopathic pulmonary fibrosis: Secondary | ICD-10-CM | POA: Diagnosis not present

## 2024-06-11 DIAGNOSIS — R942 Abnormal results of pulmonary function studies: Secondary | ICD-10-CM

## 2024-06-11 MED ORDER — HYDROCODONE BIT-HOMATROP MBR 5-1.5 MG/5ML PO SOLN: 5.0000 mL | Freq: Four times a day (QID) | ORAL | 0 refills | Status: DC | PRN

## 2024-06-11 NOTE — Patient Instructions (Addendum)
 It is good to see you today. We have reviewed your PET scan. I am concerned that you are too high risk for bronchoscopy with biopsy. You are very high risk for complications.  I spoke with Dr. Shelah to see if he feels the cervical lymph nodes are reachable by percutaneous approach. He recommended I refer you to Interventional radiology to have this procedure evaluated and if feasible done. You will follow up with me  1 week after the biopsy to review the results . If we cannot consider either biopsy approach we will talk with Dr. Sherrod about treatment options without diagnosis. There is also the option of repeating imaging to reevaluate as there is potential that this could be infectious or inflammatory. I have renewed your cough medication. Take as directed.  Wear oxygen  to maintain your oxygen  sats > 88% at all times.  Call if you need us .  It was really good to see you. Please contact office for sooner follow up if symptoms do not improve or worsen or seek emergency care

## 2024-06-11 NOTE — Telephone Encounter (Signed)
 Agree

## 2024-06-11 NOTE — Progress Notes (Signed)
 History of Present Illness Diane [name] is a 68 y.o. female Former smoker with history of Unresectable stage IIB (T2a, N1, M0) non-small cell lung cancer, squamous cell carcinoma presented with right lower lobe lung mass in addition to right hilar lymphadenopathy diagnosed in March 2023. She has been re-referred for consideration of bronchoscopy with biopsies of a new left lower lobe subpleural pulmonary nodule, and Bilateral hilar lymphadenopathy and right level 4 cervical lymph nodes, new or progressed to prior.   06/11/2024 Discussed the use of AI scribe software for clinical note transcription with the patient, who gave verbal consent to proceed.  History of Present Illness Diane [name] is a 68 year old female with interstitial pulmonary fibrosis and lung cancer who presents with worsening respiratory symptoms and concerns about new surveillance imaging with a a new lung nodule and cervical lymphadenopathy concerning for progression of her disease.. She is accompanied by her husband. She was referred for consideration of bronchoscopy and biopsy of a new subpleural lung nodule and bilateral cervical lymphadenopathy by the oncology team.  She experiences worsening respiratory symptoms, requiring eight liters of oxygen  at rest.  She was hospitalized for five days in June due to breathing difficulties, which coincided with stopping prednisone . She had been on a tapering dose of prednisone , starting at 60 mg daily and reducing over several weeks, but stopped abruptly due to side effects such as facial swelling and irritability, and weight gain.  She has a history of interstitial pulmonary fibrosis and lung cancer, and is currently on hospice care for IPF but not for cancer. She reports poor quality of life, stating 'I can't get up and move' without her oxygen  levels dropping significantly. She experiences oxygen  desaturation to the fifties upon exertion.   She is currently using  eight liters of oxygen , with saturations of 96-98% when sitting still in the office, but experiences significant desaturation when lying flat or moving. She reports an episode where her oxygen  saturation dropped to 17% due to an unplugged oxygen  supply.  We discussed the option of repeat bronchoscopy for biopsy of the new pulmonary nodule and cervical lymphadenopathy.  I explained to The that she is too high risk for general anesthesia due to her chronic hypoxic respiratory failure due to her interstitial lung disease and her lung cancer. We talked about other options for biopsy.  These include referral to interventional radiology to evaluate whether or not the cervical lymph nodes could be biopsied under CT or ultrasound guidance.  I have placed the referral to interventional radiology and requested that they evaluate her films and schedule her for a biopsy of the cervical lymph nodes if possible for definitive diagnosis regarding the new imaging findings.  Diane [name] and I did talk about whether or not she would agree to additional treatment.  We discussed her current quality of life.  She has amazing support through her husband and his family.  We decided to see what interventional radiology could offer, and make decisions once we find out if biopsy is an option.  She experiences constant headaches, and has since June.She is concerned about the potential spread of cancer to her brain.She underwent an brain MRI, which has not yet been read, and expresses concern about the possibility of metastasis to the brain.   She is on Hycodan  cough medicine, which she requested a refill for, and has experienced neuropathic pain that was not relieved by pain medications. The hospice nurse suggested it was nerve-related pain.  Diane [name]  is  in great spirits, considering what she is dealing with every day. We will await the response from interventional radiology regarding potential for cervical lymph node biopsy.      Test Results: PET 05/14/2024 Multifocal areas of new metabolic activity and masslike consolidations as detail above concerning for new foci of recurrent malignancy/metastatic disease versus infection/inflammation as opposed to a focal activity in right lung base which is now resolved.   New left lower lobe subpleural pulmonary nodule   Bilateral hilar lymphadenopathy and right level 4 cervical lymph nodes, new or progressed to prior.   Constellation of findings are concerning for progressive malignancy until proven otherwise. Superimposing infection/inflammation is in differential diagnosis. Follow-up to ensure resolution.   Otherwise no extra thoracic new site of metastatic disease.        Latest Ref Rng & Units 05/15/2024    3:19 PM 03/27/2024    1:45 PM 03/05/2024   12:53 PM  CBC  WBC 4.0 - 10.5 K/uL 11.4  6.2  11.2   Hemoglobin 12.0 - 15.0 g/dL 89.8  89.9  89.1   Hematocrit 36.0 - 46.0 % 30.6  31.2  32.5   Platelets 150.0 - 400.0 K/uL 413.0  286  296        Latest Ref Rng & Units 03/27/2024    1:45 PM 03/05/2024   12:53 PM 02/14/2024    9:39 PM  BMP  Glucose 70 - 99 mg/dL 881  881  891   BUN 8 - 23 mg/dL 15  20  13    Creatinine 0.44 - 1.00 mg/dL 9.42  9.34  8.89   Sodium 135 - 145 mmol/L 135  133  136   Potassium 3.5 - 5.1 mmol/L 3.9  4.0  3.9   Chloride 98 - 111 mmol/L 102  102  100   CO2 22 - 32 mmol/L 23  24  24    Calcium  8.9 - 10.3 mg/dL 9.3  9.4  9.3     BNP    Component Value Date/Time   BNP 20.0 03/27/2024 1345    ProBNP    Component Value Date/Time   PROBNP 58.0 03/22/2023 1559    PFT    Component Value Date/Time   FEV1PRE 1.59 02/13/2024 1343   FEV1POST 1.70 12/26/2022 1353   FVCPRE 2.00 02/13/2024 1343   FVCPOST 2.11 12/26/2022 1353   TLC 3.68 12/26/2022 1353   DLCOUNC 6.40 02/13/2024 1343   PREFEV1FVCRT 80 02/13/2024 1343   PSTFEV1FVCRT 81 12/26/2022 1353    MR BRAIN W WO CONTRAST Result Date: 06/11/2024 CLINICAL DATA:  Provided  history: Malignant neoplasm of unspecified part of unspecified bronchus or lung. Non-small cell lung cancer, staging. EXAM: MRI HEAD WITHOUT AND WITH CONTRAST TECHNIQUE: Multiplanar, multiecho pulse sequences of the brain and surrounding structures were obtained without and with intravenous contrast. CONTRAST:  7mL GADAVIST  GADOBUTROL  1 MMOL/ML IV SOLN COMPARISON:  Brain MRI 01/03/2022. FINDINGS: Brain: No age-advanced or lobar predominant cerebral atrophy. Multifocal T2 FLAIR hyperintense signal abnormality within the cerebral white matter, nonspecific but compatible with minimal chronic small vessel ischemic disease. Few nonspecific chronic microhemorrhages scattered within the supratentorial brain. No cortical encephalomalacia is identified. There is no acute infarct. No evidence of an intracranial mass. No extra-axial fluid collection. No midline shift. No pathologic intracranial enhancement identified. Vascular: Maintained flow voids within the proximal large arterial vessels. Skull and upper cervical spine: No focal worrisome marrow lesion. Sinuses/Orbits: No mass or acute finding within the imaged orbits. Small mucous retention cyst within the  left maxillary sinus. IMPRESSION: 1. No evidence of intracranial metastatic disease. 2. Minor chronic small vessel ischemic changes within the cerebral white matter, similar to the prior MRI of 01/03/2022. 3. Small left maxillary sinus mucous retention cyst. Electronically Signed   By: Rockey Childs D.O.   On: 06/11/2024 11:02   NM PET Image Restage (PS) Skull Base to Thigh (F-18 FDG) Result Date: 05/20/2024 CLINICAL DATA:  Subsequent treatment strategy for non-small cell lung cancer of right lower lobe EXAM: NUCLEAR MEDICINE PET SKULL BASE TO THIGH TECHNIQUE: 7.07 mCi F-18 FDG was injected intravenously. Full-ring PET imaging was performed from the skull base to thigh after the radiotracer. CT data was obtained and used for attenuation correction and anatomic  localization. Fasting blood glucose: 89 mg/dl COMPARISON:  April 05, 2023, CT angio chest March 27, 2024 head CT FINDINGS: Mediastinal blood pool activity: SUV max 2.4 Liver activity: SUV max 3.6 NECK: Right level 4 hypermetabolic 12 mm lymph node max SUV 3.9, previously subcentimeter non FDG avid (image 47). Incidental CT findings: None. CHEST: *There is a hypermetabolic mass/consolidation involving the right upper lobe central aspect measuring with max SUV 22.5, new to prior PET-CT and similar to prior CT. *Left lower lobe subpleural nodule with max SUV 7 (image 83), new to prior. *Near complete resolution of hypermetabolic consolidation involving right upper lobe. *New contralateral left hilar hypermetabolic lymph node max SUV 14.5 increased metabolic activity of right hilar lymph node max SUV 6. *Anterior mediastinal and right paratracheal subcentimeter mildly hypermetabolic lymph nodes with increased FDG uptake. Persistent upper lobe predominant centrilobular emphysematous changes, interlobular septal thickening, fibrotic changes and ground-glass attenuation . Atherosclerotic calcifications of coronary arteries. Mild cardiomegaly. Trace right pleural effusion is suspected. ABDOMEN/PELVIS: No suspicious findings to suggest distant metastatic disease within the abdomen and pelvis. Incidental CT findings: Hysterectomy. Colonic diverticulosis. Cholecystectomy. Hiatal hernia. SKELETON: No suspicious osseous metastasis. Incidental CT findings: None. IMPRESSION: Multifocal areas of new metabolic activity and masslike consolidations as detail above concerning for new foci of recurrent malignancy/metastatic disease versus infection/inflammation as opposed to a focal activity in right lung base which is now resolved. New left lower lobe subpleural pulmonary nodule Bilateral hilar lymphadenopathy and right level 4 cervical lymph nodes, new or progressed to prior. Constellation of findings are concerning for progressive  malignancy until proven otherwise. Superimposing infection/inflammation is in differential diagnosis. Follow-up to ensure resolution. Otherwise no extra thoracic new site of metastatic disease. Electronically Signed   By: Megan  Zare M.D.   On: 05/20/2024 18:13     Past medical hx Past Medical History:  Diagnosis Date   Anxiety    Arthritis    fingers, left foot   C. difficile diarrhea 2015   COPD (chronic obstructive pulmonary disease) (HCC)    Depression    Eczema    H pylori ulcer    Headache(784.0)    Hypercholesterolemia    Hypertension    Interstitial lung disease (HCC)    Kidney stones    20 years ago   lung ca with recurrence 10/2021   01/2023   Pneumonia    5 years ago   Seizures (HCC)    2 years ago, cluster of seizures none since   Shingles      Social History   Tobacco Use   Smoking status: Former    Current packs/day: 0.00    Average packs/day: 0.5 packs/day for 30.0 years (15.0 ttl pk-yrs)    Types: Cigarettes    Start date: 10/10/1991  Quit date: 10/09/2021    Years since quitting: 2.6   Smokeless tobacco: Never   Tobacco comments:    5-10 cigarettes a day (plans to quit prior to this surgery 05/06/15)  Vaping Use   Vaping status: Some Days  Substance Use Topics   Alcohol use: Yes    Alcohol/week: 3.0 standard drinks of alcohol    Types: 3 Shots of liquor per week    Comment: 2x week   Drug use: No    Diane [name] reports that she quit smoking about 2 years ago. Her smoking use included cigarettes. She started smoking about 32 years ago. She has a 15 pack-year smoking history. She has never used smokeless tobacco. She reports current alcohol use of about 3.0 standard drinks of alcohol per week. She reports that she does not use drugs.  Tobacco Cessation: Counseling given: Not Answered Tobacco comments: 5-10 cigarettes a day (plans to quit prior to this surgery 05/06/15) Former smoker, quit 2023 with a 20+ pack year smoking history  Past  surgical hx, Family hx, Social hx all reviewed.  Current Outpatient Medications on File Prior to Visit  Medication Sig   acetaminophen  (TYLENOL ) 325 MG tablet Take 2 tablets (650 mg total) by mouth every 4 (four) hours as needed for mild pain (pain score 1-3) or fever (or Fever >/= 101).   albuterol  (PROVENTIL ) (2.5 MG/3ML) 0.083% nebulizer solution Take 3 mLs (2.5 mg total) by nebulization every 2 (two) hours as needed for wheezing or shortness of breath.   DULoxetine  (CYMBALTA ) 30 MG capsule Take 30 mg by mouth daily.   gabapentin  (NEURONTIN ) 100 MG capsule Take 200 mg by mouth 3 (three) times daily.   guaiFENesin  (MUCINEX ) 600 MG 12 hr tablet Take 1 tablet (600 mg total) by mouth 2 (two) times daily.   loratadine  (CLARITIN ) 10 MG tablet Take 10 mg by mouth daily.   Nebulizers (COMPRESSOR/NEBULIZER) MISC 1 Units by Does not apply route daily as needed.   omeprazole  (PRILOSEC) 20 MG capsule Take 1 capsule (20 mg total) by mouth 2 (two) times daily. (Needs to be seen before next refill) (Patient taking differently: Take 20 mg by mouth at bedtime. (Needs to be seen before next refill))   Pirfenidone  (ESBRIET ) 267 MG TABS Take 3 tablets (801 mg total) by mouth 3 (three) times daily with meals. (Account: [name]) (Patient taking differently: Take 534 mg by mouth 3 (three) times daily with meals. (Account: [name]))   Polyethyl Glycol-Propyl Glycol 0.4-0.3 % SOLN Place 1 drop into both eyes in the morning and at bedtime.   predniSONE  (DELTASONE ) 10 MG tablet Take 1 tablet (10 mg total) by mouth See admin instructions. Take prednisone  60mg  daily x 2 weeks and then  40 mg daily x 2 weeks then prednisone  30mg  daily x 2 weeks, then 20mg  daily x 2 weeks then 10mg  daily x 2 weeks, then 5mg  daily x 2 weeeks and then 5mg  On MWF x 2 weeks and stop   Probiotic Product (PROBIOTIC DAILY PO) Take 1 capsule by mouth daily.   rosuvastatin  (CRESTOR ) 10 MG tablet Take 10 mg by  mouth at bedtime.   saline (AYR) GEL Place 1 Application into both nostrils in the morning and at bedtime.   traZODone  (DESYREL ) 100 MG tablet Take 1 tablet (100 mg total) by mouth at bedtime. For Sleep   lidocaine  (LIDODERM ) 5 % Place 1 patch onto the skin daily. Remove & Discard patch within 12 hours or as directed by MD (Patient not taking:  Reported on 06/11/2024)   No current facility-administered medications on file prior to visit.     Allergies  Allergen Reactions   Penicillins Shortness Of Breath   Alpha-Gal Other (See Comments)    Abdominal Pain All four legged mammals; pork, beef, lamb, deer, etc.    Review Of Systems:  Constitutional:   No  weight loss, night sweats,  Fevers, chills, +fatigue, or  lassitude.  HEENT:   + headaches,  Difficulty swallowing,  Tooth/dental problems, or  Sore throat,                No sneezing, itching, ear ache, nasal congestion, post nasal drip,   CV:  No chest pain,  Orthopnea, PND, swelling in lower extremities, anasarca, dizziness, palpitations, syncope.   GI  No heartburn, indigestion, abdominal pain, nausea, vomiting, diarrhea, change in bowel habits, loss of appetite, bloody stools.   Resp: + shortness of breath with exertion less at rest.  No excess mucus, no productive cough,  No non-productive cough,  No coughing up of blood.  No change in color of mucus.  No wheezing.  No chest wall deformity  Skin: no rash or lesions.  GU: no dysuria, change in color of urine, no urgency or frequency.  No flank pain, no hematuria   MS:  No joint pain or swelling.  + decreased range of motion.  No back pain.  Psych:  No change in mood or affect. No depression or anxiety.  No memory loss.   Vital Signs BP 112/68   Pulse 86   Temp 98 F (36.7 C) (Temporal)   Ht 5' 5 (1.651 m)   Wt 150 lb 6.4 oz (68.2 kg)   SpO2 98% Comment: 8L  BMI 25.03 kg/m    Physical Exam:  General- No distress,  A&Ox3 pleasant ENT: No sinus tenderness, TM clear,  pale nasal mucosa, no oral exudate,no post nasal drip, no LAN Cardiac: S1, S2, regular rate and rhythm, no murmur Chest: No wheeze/ rales/ dullness; no accessory muscle use, no nasal flaring, no sternal retractions, crackles bilaterally from mid back down Abd.: Soft Non-tender, nondistended, bowel sounds positive,Body mass index is 25.03 kg/m.  Ext: No clubbing cyanosis, edema, no obvious deformities Neuro: Physical deconditioning related to her chronic hypoxic respiratory failure, moving all extremities x 4, alert and oriented x 3. Skin: No rashes, warm and dry, no obvious skin lesions Psych: normal mood and behavior, emotionally profoundly strong    Assessment & Plan History of unresectable right lower lobe stage IIb adenocarcinoma of the lung 2023. Course of concurrent chemoradiation last dose 02/27/2022 Interstitial lung disease Chronic hypoxic respiratory failure Recent surveillance imaging showed chronic postradiation changes and scarring in the right lung with concern for recurrent disease with  bilateral hilar lymphadenopathy and right level 4 cervical lymph nodes. Patient is not a candidate for bronchoscopy with biopsy under general anesthesia due to high risk of adverse outcome due to unknown underlying pulmonary function Plan  We have reviewed your PET scan. I am concerned that you are too high risk for bronchoscopy with biopsy. You are very high risk for complications.  I spoke with Dr. Shelah to see if he feels the cervical lymph nodes are reachable by percutaneous approach. He recommended I refer you to Interventional radiology to have this procedure evaluated and if feasible done. I have placed the orders to interventional radiology to evaluate and manage CT/ ultrasound-guided biopsy if they feel it is feasible You will follow up with me  1 week after the biopsy to review the results . If we cannot consider either biopsy approach we will talk with Dr. Sherrod about treatment  options without diagnosis. There is also the option of repeating imaging to reevaluate as there is potential that this could be infectious or inflammatory. I have renewed your cough medication. Take as directed.  Wear oxygen  to maintain your oxygen  sats > 88% at all times.  Call if you need us .  It was really good to see you. Please contact office for sooner follow up if symptoms do not improve or worsen or seek emergency care     Idiopathic pulmonary fibrosis with advanced oxygen  requirement Advanced oxygen  requirement with idiopathic pulmonary fibrosis.  Plan Currently on 8 liters of oxygen  at rest.  Desaturation when flat indicates severe lung impairment. High risk with general anesthesia. - Maintain current oxygen  therapy. - Avoid procedures requiring general anesthesia.  Chronic neuropathic pain Chronic neuropathic pain not well controlled with current medications. - Continue gabapentin  as prescribed   Goals of Care On hospice for idiopathic pulmonary fibrosis, not for cancer. She desires to avoid aggressive treatments like immunotherapy due to side effects and has discussed preferences with family. Hesitant to undergo high-risk procedures due to quality of life concerns. - Continue ongoing discussions - As I reiterated multiple times Diane [name] needs to let us  know when she wants to stop treatment, as this is her decision   I spent 40 minutes dedicated to the care of this patient on the date of this encounter to include pre-visit review of records, face-to-face time with the patient discussing conditions above, post visit ordering of testing, clinical documentation with the electronic health record, making appropriate referrals as documented, and communicating necessary information to the patient's healthcare team.     Diane JULIANNA Lites, NP 06/11/2024  7:20 PM

## 2024-06-11 NOTE — Telephone Encounter (Signed)
 I have called Diane [name] and explained that I spoke with Dr. Shelah regarding the cervical lymph nodes and whether they would be something IR could biopsy as she is not a candidate for general anesthesia. He feels the radiologist needs to look at the films to determine if they feel it is reasonable to attempt the procedure. I explained that I would place an order to Interventional radiology to evaluate patient for US / CT guided biopsy of the cervical lymph nodes that were PET avid. She understands if they feel they can do this they will call to get her scheduled. She verbalized understanding.

## 2024-06-12 NOTE — Progress Notes (Unsigned)
 Jenna Cordella LABOR, MD  Baldwin Rosella L EBUS would be with pulmonary.  I do not think this should go to IR at all unless they fail with the EndoBronchial UltraSound bx.       Previous Messages    ----- Message ----- From: Baldwin Rosella CROME Sent: 06/12/2024   4:09 PM EDT To: Cordella LABOR Jenna, MD Subject: RE: CT US  GUIDED BIOPSY                        Would you like me schedule as US  lymph node biopsy ? and would this be w/ mod sedation or local anesthestic ? ----- Message ----- From: Jenna Cordella LABOR, MD Sent: 06/12/2024   3:35 PM EDT To: Rosella CROME Baldwin Subject: RE: CT US  GUIDED BIOPSY                        Recommend EBUS biopsy over percutaneous.  FDG positive lvl 4 lymph node not likely percutaneous accessible due to depth/location.  If EBUS not available could target lung percutaneously ----- Message ----- From: Baldwin Rosella CROME Sent: 06/11/2024   4:31 PM EDT To: Rosella CROME Baldwin; Taryn F Rigney, RT; Ir Proc* Subject: CT US  GUIDED BIOPSY                            Procedure :CT US  GUIDED BIOPSY  Reason :lymphadenopathy Dx: Multiple lung nodules [R91.8 (ICD-10-CM)]; Lymphadenopathy [R59.1 (ICD-10-CM)]    History :MR BRAIN W WO CONTRAST,NM PET Image Restage (PS) Skull Base to Thigh (F-18 FDG),CT Angio Chest PE W/Cm &/Or Wo Cm,DG Chest 2 View,CT Chest Wo Contrast ,CT Chest W Contrast  Provider:Groce, Lauraine FALCON, NP  Provider contact ; [phone]

## 2024-06-12 NOTE — Progress Notes (Unsigned)
 Jenna Cordella LABOR, MD  Baldwin Rosella L Recommend EBUS biopsy over percutaneous.  FDG positive lvl 4 lymph node not likely percutaneous accessible due to depth/location.  If EBUS not available could target lung percutaneously       Previous Messages    ----- Message ----- From: Baldwin Rosella CROME Sent: 06/11/2024   4:31 PM EDT To: Rosella CROME Baldwin; Taryn F Rigney, RT; Ir Proc* Subject: CT US  GUIDED BIOPSY                            Procedure :CT US  GUIDED BIOPSY  Reason :lymphadenopathy Dx: Multiple lung nodules [R91.8 (ICD-10-CM)]; Lymphadenopathy [R59.1 (ICD-10-CM)]    History :MR BRAIN W WO CONTRAST,NM PET Image Restage (PS) Skull Base to Thigh (F-18 FDG),CT Angio Chest PE W/Cm &/Or Wo Cm,DG Chest 2 View,CT Chest Wo Contrast ,CT Chest W Contrast  Provider:Groce, Lauraine FALCON, NP  Provider contact ; [phone]

## 2024-06-13 ENCOUNTER — Telehealth: Payer: Self-pay | Admitting: Acute Care

## 2024-06-13 ENCOUNTER — Other Ambulatory Visit: Payer: Self-pay | Admitting: Acute Care

## 2024-06-13 DIAGNOSIS — R918 Other nonspecific abnormal finding of lung field: Secondary | ICD-10-CM

## 2024-06-13 NOTE — Telephone Encounter (Signed)
 She cannot have general anesthesia, that's why no EBUS scheduled. Question is there any role for cervical nodal bx. If not then I think we are going to have to consider empiric theray.

## 2024-06-13 NOTE — Telephone Encounter (Signed)
 I have attempted to call the patient to let her know that the Interventional radiologists have deferred on doing her biopsy.There was no answer. I left a VM and  I explained that I will place a referral to radiation oncology to see if they will consider doing empiric radiation to the new areas of concern , if that is something she is interested in doing.I have left her my contact information if she needs to talk about it further.

## 2024-06-13 NOTE — Progress Notes (Signed)
 I agree with the plans as outlined above.   Lamar Chris, MD, PhD 06/13/2024, 9:36 AM [facility] [phone] or if no answer before 7:00PM call [phone] For any issues after 7:00PM please call eLink [phone]

## 2024-06-13 NOTE — Telephone Encounter (Signed)
 Copied from CRM [id number]. Topic: Clinical - Request for Lab/Test Order >> Jun 13, 2024  9:04 AM Joesph PARAS wrote: Reason for CRM: [facility] is calling to inform that radiologist would like the biopsy to be done by Pulm. There should be a note in EPIC under the name of the Caller. Please see. Phone number should be noted for questions in that note.  Spoke with patient regarding prior message . Patient stated she does have a office visit with Dr.Mohamed on 06/18/24.Patient stated she is going to discuss this with Dr. Sherrod at that office visit.  Patient's voice was understanding.Nothing else further needed

## 2024-06-13 NOTE — Telephone Encounter (Signed)
 Attempted to schedule patient's CT US  GUIDED BIOPSY but received information from the scheduler on the procedure. Should we cancel CT US  guided Biopsy?    Jenna Cordella LABOR, MD  Baldwin Rosella L EBUS would be with pulmonary.  I do not think this should go to IR at all unless they fail with the EndoBronchial UltraSound bx.        Previous Messages    ----- Message ----- From: Baldwin Rosella CROME Sent: 06/12/2024   4:09 PM EDT To: Cordella LABOR Jenna, MD Subject: RE: CT US  GUIDED BIOPSY                        Would you like me schedule as US  lymph node biopsy ? and would this be w/ mod sedation or local anesthestic ? ----- Message ----- From: Jenna Cordella LABOR, MD Sent: 06/12/2024   3:35 PM EDT To: Rosella CROME Baldwin Subject: RE: CT US  GUIDED BIOPSY                        Recommend EBUS biopsy over percutaneous.  FDG positive lvl 4 lymph node not likely percutaneous accessible due to depth/location.  If EBUS not available could target lung percutaneously ----- Message ----- From: Baldwin Rosella CROME Sent: 06/11/2024   4:31 PM EDT To: Rosella CROME Baldwin; Taryn F Rigney, RT; Ir Proc* Subject: CT US  GUIDED BIOPSY                            Procedure :CT US  GUIDED BIOPSY  Reason :lymphadenopathy Dx: Multiple lung nodules [R91.8 (ICD-10-CM)]; Lymphadenopathy [R59.1 (ICD-10-CM)]    History :MR BRAIN W WO CONTRAST,NM PET Image Restage (PS) Skull Base to Thigh (F-18 FDG),CT Angio Chest PE W/Cm &/Or Wo Cm,DG Chest 2 View,CT Chest Wo Contrast ,CT Chest W Contrast  Provider:Groce, Lauraine FALCON, NP  Provider contact ; [phone]

## 2024-06-17 ENCOUNTER — Telehealth: Payer: Self-pay

## 2024-06-17 NOTE — Telephone Encounter (Signed)
 Diane [name],   Pt is returning your call. Please advise when you are able too. See mychart messages if needed.

## 2024-06-18 ENCOUNTER — Encounter: Payer: Self-pay | Admitting: Internal Medicine

## 2024-06-18 ENCOUNTER — Inpatient Hospital Stay (HOSPITAL_BASED_OUTPATIENT_CLINIC_OR_DEPARTMENT_OTHER): Admitting: Internal Medicine

## 2024-06-18 ENCOUNTER — Inpatient Hospital Stay: Attending: Internal Medicine

## 2024-06-18 ENCOUNTER — Telehealth: Payer: Self-pay | Admitting: Internal Medicine

## 2024-06-18 VITALS — BP 117/66 | Temp 97.5°F | Resp 17 | Wt 148.5 lb

## 2024-06-18 DIAGNOSIS — Z9221 Personal history of antineoplastic chemotherapy: Secondary | ICD-10-CM | POA: Insufficient documentation

## 2024-06-18 DIAGNOSIS — C4492 Squamous cell carcinoma of skin, unspecified: Secondary | ICD-10-CM

## 2024-06-18 DIAGNOSIS — C7801 Secondary malignant neoplasm of right lung: Secondary | ICD-10-CM

## 2024-06-18 DIAGNOSIS — Z923 Personal history of irradiation: Secondary | ICD-10-CM | POA: Insufficient documentation

## 2024-06-18 DIAGNOSIS — Z85118 Personal history of other malignant neoplasm of bronchus and lung: Secondary | ICD-10-CM | POA: Diagnosis present

## 2024-06-18 DIAGNOSIS — C7802 Secondary malignant neoplasm of left lung: Secondary | ICD-10-CM | POA: Insufficient documentation

## 2024-06-18 DIAGNOSIS — Z79899 Other long term (current) drug therapy: Secondary | ICD-10-CM | POA: Insufficient documentation

## 2024-06-18 DIAGNOSIS — C3490 Malignant neoplasm of unspecified part of unspecified bronchus or lung: Secondary | ICD-10-CM

## 2024-06-18 LAB — CBC WITH DIFFERENTIAL (CANCER CENTER ONLY)
Abs Immature Granulocytes: 0.05 K/uL (ref 0.00–0.07)
Basophils Absolute: 0 K/uL (ref 0.0–0.1)
Basophils Relative: 1 %
Eosinophils Absolute: 0.4 K/uL (ref 0.0–0.5)
Eosinophils Relative: 5 %
HCT: 30.4 % — ABNORMAL LOW (ref 36.0–46.0)
Hemoglobin: 10 g/dL — ABNORMAL LOW (ref 12.0–15.0)
Immature Granulocytes: 1 %
Lymphocytes Relative: 14 %
Lymphs Abs: 1.2 K/uL (ref 0.7–4.0)
MCH: 30.1 pg (ref 26.0–34.0)
MCHC: 32.9 g/dL (ref 30.0–36.0)
MCV: 91.6 fL (ref 80.0–100.0)
Monocytes Absolute: 0.8 K/uL (ref 0.1–1.0)
Monocytes Relative: 10 %
Neutro Abs: 6 K/uL (ref 1.7–7.7)
Neutrophils Relative %: 69 %
Platelet Count: 367 K/uL (ref 150–400)
RBC: 3.32 MIL/uL — ABNORMAL LOW (ref 3.87–5.11)
RDW: 12.5 % (ref 11.5–15.5)
WBC Count: 8.5 K/uL (ref 4.0–10.5)
nRBC: 0 % (ref 0.0–0.2)

## 2024-06-18 LAB — CMP (CANCER CENTER ONLY)
ALT: 10 U/L (ref 0–44)
AST: 15 U/L (ref 15–41)
Albumin: 3.7 g/dL (ref 3.5–5.0)
Alkaline Phosphatase: 54 U/L (ref 38–126)
Anion gap: 6 (ref 5–15)
BUN: 9 mg/dL (ref 8–23)
CO2: 30 mmol/L (ref 22–32)
Calcium: 9.4 mg/dL (ref 8.9–10.3)
Chloride: 99 mmol/L (ref 98–111)
Creatinine: 0.63 mg/dL (ref 0.44–1.00)
GFR, Estimated: >60 mL/min (ref >60)
Glucose, Bld: 123 mg/dL — ABNORMAL HIGH (ref 70–99)
Potassium: 4.1 mmol/L (ref 3.5–5.1)
Sodium: 135 mmol/L (ref 135–145)
Total Bilirubin: 0.3 mg/dL (ref 0.0–1.2)
Total Protein: 8.3 g/dL — ABNORMAL HIGH (ref 6.5–8.1)

## 2024-06-18 MED ORDER — PROCHLORPERAZINE MALEATE 10 MG PO TABS: 10.0000 mg | ORAL_TABLET | Freq: Four times a day (QID) | ORAL | 1 refills | Status: DC | PRN

## 2024-06-18 MED ORDER — ONDANSETRON HCL 8 MG PO TABS: 8.0000 mg | ORAL_TABLET | Freq: Three times a day (TID) | ORAL | 1 refills | Status: DC | PRN

## 2024-06-18 MED ORDER — LIDOCAINE-PRILOCAINE 2.5-2.5 % EX CREA: TOPICAL_CREAM | CUTANEOUS | 3 refills | Status: DC

## 2024-06-18 NOTE — Progress Notes (Signed)
 DISCONTINUE ON PATHWAY REGIMEN - Non-Small Cell Lung     Administer weekly:     Paclitaxel       Carboplatin    **Always confirm dose/schedule in your pharmacy ordering system**  PRIOR TREATMENT: [id number]: Carboplatin  AUC=2 + Paclitaxel  45 mg/m2 Weekly During Radiation  START ON PATHWAY REGIMEN - Non-Small Cell Lung     A cycle is every 21 days:     Pembrolizumab   **Always confirm dose/schedule in your pharmacy ordering system**  Patient Characteristics: Stage IV Metastatic, Squamous, Molecular Analysis Not Elected, PS = 0, 1, Initial Chemotherapy/Immunotherapy, Immunotherapy Candidate, PD-L1 Expression Positive ? 50% (TPS) Therapeutic Status: Stage IV Metastatic Histology: Squamous Cell ECOG Performance Status: 1 Chemotherapy/Immunotherapy Line of Therapy: Initial Chemotherapy/Immunotherapy Immunotherapy Candidate Status: Candidate for Immunotherapy PD-L1 Expression Status: PD-L1 Positive ? 50% (TPS) Intent of Therapy: Non-Curative / Palliative Intent, Discussed with Patient

## 2024-06-18 NOTE — Telephone Encounter (Signed)
 Scheduled appointments per WQ. Talked with the patient and she is aware of the made appointments.

## 2024-06-18 NOTE — Progress Notes (Signed)
 [facility] Telephone:([phone]   Fax:([phone]  OFFICE PROGRESS NOTE  [name], MD [address] [place] [address]  DIAGNOSIS: Unresectable stage IIB (T2a, N1, M0) non-small cell lung cancer, squamous cell carcinoma presented with right lower lobe lung mass in addition to right hilar lymphadenopathy diagnosed in March 2023.    The patient had PD-L1 expression of 99%.  PRIOR THERAPY: A course of concurrent chemoradiation with weekly carboplatin  for AUC of 2 and paclitaxel  45 Mg/M2.  First dose started 01/16/2022.  Status post 7 cycles.  Last dose was given on 02/27/2022.  CURRENT THERAPY: Keytruda 200 mg IV every 3 weeks.  Start 06/25/2024  INTERVAL HISTORY: Julianna Brun 68 y.o. female returns to the clinic today for follow-up visit accompanied by her husband.Discussed the use of AI scribe software for clinical note transcription with the patient, who gave verbal consent to proceed.  History of Present Illness Mavery Milling is a 68 year old female with unresectable stage 2B non-small cell lung cancer who presents for re-evaluation and discussion of treatment options.  Diagnosed with unresectable stage 2B non-small cell lung cancer, squamous cell carcinoma, in March 2023, with a PD-L1 expression of 99%. Initially underwent concurrent chemoradiation with weekly carboplatin  and paclitaxel . Recent imaging studies were reported to show multifocal areas of new metabolic activity, mass-like consolidation, a new left lower lobe subpleural nodule, bilateral hilar adenopathy, and right level 4 cervical lymph nodes.  Experiencing new onset of neck pain radiating to the shoulder blade, significantly impacting her quality of life. Describes her quality of life as 'pretty shitty,' largely due to her COPD and exacerbation of symptoms. Concerned about breathing, which she feels may be worsened by her current chest condition.  Recalls that in May 2025, her scan  showed no significant findings, but by June 2025, there were some changes, and by August 2025, the cancer appeared to have progressed significantly. Concerned about the aggressive nature of her cancer and the impact on her life expectancy.     MEDICAL HISTORY: Past Medical History:  Diagnosis Date   Anxiety    Arthritis    fingers, left foot   C. difficile diarrhea 2015   COPD (chronic obstructive pulmonary disease) (HCC)    Depression    Eczema    H pylori ulcer    Headache(784.0)    Hypercholesterolemia    Hypertension    Interstitial lung disease (HCC)    Kidney stones    20 years ago   lung ca with recurrence 10/2021   01/2023   Pneumonia    5 years ago   Seizures (HCC)    2 years ago, cluster of seizures none since   Shingles     ALLERGIES:  is allergic to penicillins and alpha-gal.  MEDICATIONS:  Current Outpatient Medications  Medication Sig Dispense Refill   acetaminophen  (TYLENOL ) 325 MG tablet Take 2 tablets (650 mg total) by mouth every 4 (four) hours as needed for mild pain (pain score 1-3) or fever (or Fever >/= 101).     albuterol  (PROVENTIL ) (2.5 MG/3ML) 0.083% nebulizer solution Take 3 mLs (2.5 mg total) by nebulization every 2 (two) hours as needed for wheezing or shortness of breath. 75 mL 12   DULoxetine  (CYMBALTA ) 30 MG capsule Take 30 mg by mouth daily.     gabapentin  (NEURONTIN ) 100 MG capsule Take 200 mg by mouth 3 (three) times daily.     guaiFENesin  (MUCINEX ) 600 MG 12 hr tablet  Take 1 tablet (600 mg total) by mouth 2 (two) times daily. 60 tablet 2   HYDROcodone  bit-homatropine (HYCODAN) 5-1.5 MG/5ML syrup Take 5 mLs by mouth every 6 (six) hours as needed for cough. 240 mL 0   lidocaine  (LIDODERM ) 5 % Place 1 patch onto the skin daily. Remove & Discard patch within 12 hours or as directed by MD 30 patch 0   loratadine  (CLARITIN ) 10 MG tablet Take 10 mg by mouth daily.     Nebulizers (COMPRESSOR/NEBULIZER) MISC 1 Units by Does not apply route daily  as needed. 1 each 0   omeprazole  (PRILOSEC) 20 MG capsule Take 1 capsule (20 mg total) by mouth 2 (two) times daily. (Needs to be seen before next refill) (Patient taking differently: Take 20 mg by mouth at bedtime. (Needs to be seen before next refill)) 60 capsule 0   Pirfenidone  (ESBRIET ) 267 MG TABS Take 3 tablets (801 mg total) by mouth 3 (three) times daily with meals. (Account: [email or web address]) (Patient taking differently: Take 534 mg by mouth 3 (three) times daily with meals. (Account: [email or web address])) 270 tablet 5   Polyethyl Glycol-Propyl Glycol 0.4-0.3 % SOLN Place 1 drop into both eyes in the morning and at bedtime.     predniSONE  (DELTASONE ) 10 MG tablet Take 1 tablet (10 mg total) by mouth See admin instructions. Take prednisone  60mg  daily x 2 weeks and then  40 mg daily x 2 weeks then prednisone  30mg  daily x 2 weeks, then 20mg  daily x 2 weeks then 10mg  daily x 2 weeks, then 5mg  daily x 2 weeeks and then 5mg  On MWF x 2 weeks and stop 321.42 tablet 1   Probiotic Product (PROBIOTIC DAILY PO) Take 1 capsule by mouth daily.     rosuvastatin  (CRESTOR ) 10 MG tablet Take 10 mg by mouth at bedtime.     saline (AYR) GEL Place 1 Application into both nostrils in the morning and at bedtime.     traZODone  (DESYREL ) 100 MG tablet Take 1 tablet (100 mg total) by mouth at bedtime. For Sleep 30 tablet 4   No current facility-administered medications for this visit.    SURGICAL HISTORY:  Past Surgical History:  Procedure Laterality Date   ABDOMINAL HYSTERECTOMY     ADENOIDECTOMY     APPENDECTOMY     BRONCHIAL BIOPSY  12/20/2021   Procedure: BRONCHIAL BIOPSIES;  Surgeon: Brenna Adine CROME, DO;  Location: [facility];  Service: Pulmonary;;   BRONCHIAL BRUSHINGS  12/20/2021   Procedure: BRONCHIAL BRUSHINGS;  Surgeon: Brenna Adine CROME, DO;  Location: [facility];  Service: Pulmonary;;   BRONCHIAL NEEDLE ASPIRATION BIOPSY  12/20/2021   Procedure: BRONCHIAL NEEDLE ASPIRATION  BIOPSIES;  Surgeon: Brenna Adine CROME, DO;  Location: [facility];  Service: Pulmonary;;   CESAREAN SECTION     CHOLECYSTECTOMY     COLONOSCOPY  06/10/2012   RMR: Colonic polyps -removed as described above.    FINE NEEDLE ASPIRATION  12/20/2021   Procedure: FINE NEEDLE ASPIRATION (FNA) LINEAR;  Surgeon: Brenna Adine CROME, DO;  Location: [facility];  Service: Pulmonary;;   KNEE ARTHROSCOPY  1973   right knee,  torn cart   LUMBAR DISC SURGERY  05/12/2015   L4 L5   LUMBAR LAMINECTOMY/DECOMPRESSION MICRODISCECTOMY Left 05/12/2015   Procedure: Left L4-5 Microdiscectomy;  Surgeon: Oneil JAYSON Herald, MD;  Location: [facility];  Service: Orthopedics;  Laterality: Left;   TONSILLECTOMY     VIDEO BRONCHOSCOPY WITH ENDOBRONCHIAL ULTRASOUND Bilateral 12/20/2021   Procedure: VIDEO BRONCHOSCOPY WITH ENDOBRONCHIAL ULTRASOUND;  Surgeon: Brenna Adine CROME, DO;  Location: [facility];  Service: Pulmonary;  Laterality: Bilateral;   VIDEO BRONCHOSCOPY WITH RADIAL ENDOBRONCHIAL ULTRASOUND  12/20/2021   Procedure: RADIAL ENDOBRONCHIAL ULTRASOUND;  Surgeon: Brenna Adine CROME, DO;  Location: [facility];  Service: Pulmonary;;    REVIEW OF SYSTEMS:  Constitutional: positive for fatigue Eyes: negative Ears, nose, mouth, throat, and face: negative Respiratory: positive for cough and dyspnea on exertion Cardiovascular: negative Gastrointestinal: negative Genitourinary:negative Integument/breast: negative Hematologic/lymphatic: negative Musculoskeletal:negative Neurological: negative Behavioral/Psych: negative Endocrine: negative Allergic/Immunologic: negative   PHYSICAL EXAMINATION: General appearance: alert, cooperative, fatigued, and no distress Head: Normocephalic, without obvious abnormality, atraumatic Neck: no adenopathy, no JVD, supple, symmetrical, trachea midline, and thyroid not enlarged, symmetric, no tenderness/mass/nodules Lymph nodes: Cervical, supraclavicular, and axillary nodes normal. Resp: rales  bilaterally Back: symmetric, no curvature. ROM normal. No CVA tenderness. Cardio: regular rate and rhythm, S1, S2 normal, no murmur, click, rub or gallop GI: soft, non-tender; bowel sounds normal; no masses,  no organomegaly Extremities: extremities normal, atraumatic, no cyanosis or edema Neurologic: Alert and oriented X 3, normal strength and tone. Normal symmetric reflexes. Normal coordination and gait  ECOG PERFORMANCE STATUS: 1 - Symptomatic but completely ambulatory  Blood pressure 117/66, temperature (!) 97.5 F (36.4 C), resp. rate 17, weight 148 lb 8 oz (67.4 kg).  LABORATORY DATA: Lab Results  Component Value Date   WBC 8.5 06/18/2024   HGB 10.0 (L) 06/18/2024   HCT 30.4 (L) 06/18/2024   MCV 91.6 06/18/2024   PLT 367 06/18/2024      Chemistry      Component Value Date/Time   NA 135 06/18/2024 0902   NA 139 12/03/2019 0928   K 4.1 06/18/2024 0902   CL 99 06/18/2024 0902   CO2 30 06/18/2024 0902   BUN 9 06/18/2024 0902   BUN 14 12/03/2019 0928   CREATININE 0.63 06/18/2024 0902   CREATININE 0.64 10/14/2021 0953      Component Value Date/Time   CALCIUM  9.4 06/18/2024 0902   ALKPHOS 54 06/18/2024 0902   AST 15 06/18/2024 0902   ALT 10 06/18/2024 0902   BILITOT 0.3 06/18/2024 0902       RADIOGRAPHIC STUDIES: MR BRAIN W WO CONTRAST Result Date: 06/11/2024 CLINICAL DATA:  Provided history: Malignant neoplasm of unspecified part of unspecified bronchus or lung. Non-small cell lung cancer, staging. EXAM: MRI HEAD WITHOUT AND WITH CONTRAST TECHNIQUE: Multiplanar, multiecho pulse sequences of the brain and surrounding structures were obtained without and with intravenous contrast. CONTRAST:  7mL GADAVIST  GADOBUTROL  1 MMOL/ML IV SOLN COMPARISON:  Brain MRI 01/03/2022. FINDINGS: Brain: No age-advanced or lobar predominant cerebral atrophy. Multifocal T2 FLAIR hyperintense signal abnormality within the cerebral white matter, nonspecific but compatible with minimal chronic  small vessel ischemic disease. Few nonspecific chronic microhemorrhages scattered within the supratentorial brain. No cortical encephalomalacia is identified. There is no acute infarct. No evidence of an intracranial mass. No extra-axial fluid collection. No midline shift. No pathologic intracranial enhancement identified. Vascular: Maintained flow voids within the proximal large arterial vessels. Skull and upper cervical spine: No focal worrisome marrow lesion. Sinuses/Orbits: No mass or acute finding within the imaged orbits. Small mucous retention cyst within the left maxillary sinus. IMPRESSION: 1. No evidence of intracranial metastatic disease. 2. Minor chronic small vessel ischemic changes within the cerebral white matter, similar to the prior MRI of 01/03/2022. 3. Small left maxillary sinus mucous retention cyst. Electronically Signed   By: Rockey Childs D.O.   On: 06/11/2024 11:02  ASSESSMENT AND PLAN: This is a very pleasant 68 years old white female diagnosed with unresectable stage IIb (T2 a, N1, M0) non-small cell lung cancer, squamous cell carcinoma with right lower lobe lung mass in addition to right hilar lymphadenopathy in March 2023. The patient has PD-L1 expression of 99%. She completed a course of concurrent chemoradiation with weekly carboplatin  for AUC of 2 and paclitaxel  45 Mg/M2 status post 7 cycles.  Last dose was given on 02/27/2022. The patient is currently on observation and she is feeling fine except for the baseline shortness of breath and fatigue. She had a PET scan performed recently that showed multifocal areas of new metabolic activity and mass consolidation concerning for new foci of recurrent malignancy/metastatic disease versus infection and inflammation. The patient was high risk for repeat biopsy either by bronchoscopy or CT-guided core biopsy because of her respiratory status. I had a lengthy discussion with the patient and her husband today about her current condition  and treatment options. Assessment and Plan Assessment & Plan Unresectable stage IIB non-small cell lung cancer, squamous cell carcinoma with disease progression Recent imaging reveals multifocal areas of new metabolic activity, mass-like consolidation, new left lower lobe subpleural nodule, bilateral hilar adenopathy, and right level 4 cervical lymph nodes. Biopsy is not performed due to high risk. PD-L1 expression is 99%, indicating candidacy for immune therapy. Radiation is unsuitable due to bilateral involvement. Immune therapy alone is preferred due to high PD-L1 marker and potential for less severe side effects compared to combined chemo-immunotherapy. Discussed potential side effects of immune therapy, including rash, diarrhea, lung inflammation, and thyroid problems. Without treatment, prognosis is 3-6 months; with immune therapy, potential survival up to 2 years if responsive. - Initiate Keytruda (pembrolizumab) immune therapy, 30-minute infusion every 3 weeks. - Schedule follow-up scan every 9 weeks to assess response to treatment. - Schedule follow-up appointment one week after first treatment to evaluate tolerance. - Advise her to contact office if she decides not to proceed with treatment.  Chronic obstructive pulmonary disease (COPD) COPD contributes to poor quality of life and exacerbates symptoms. Current cancer progression may worsen respiratory symptoms. Immune therapy may improve breathing if effective against cancer, but will not address underlying COPD. She was advised to call immediately if she has any other concerning symptoms in the interval. The patient voices understanding of current disease status and treatment options and is in agreement with the current care plan.  All questions were answered. The patient knows to call the clinic with any problems, questions or concerns. We can certainly see the patient much sooner if necessary.  The total time spent in the appointment  was 55 minutes.  Disclaimer: This note was dictated with voice recognition software. Similar sounding words can inadvertently be transcribed and may not be corrected upon review.

## 2024-06-20 ENCOUNTER — Telehealth: Payer: Self-pay | Admitting: Internal Medicine

## 2024-06-20 NOTE — Telephone Encounter (Signed)
 Copied from CRM [id number]. Topic: Clinical - Order For Equipment >> Jun 20, 2024 11:53 AM Diane [name] wrote: Reason for CRM: Pt is calling after coming off of hospice for cancer treatments. Pt stated she needs a new order sent to [facility] for DME.   Patient stated she needs 8 L at rest, 10 L with activity. She needs a 10 L home unit with a humidifier. She stated she had 6 D tanks + 6 E tanks, and an M64 backup.   Pt's phone number is [phone] ok to leave a vm.

## 2024-06-22 ENCOUNTER — Encounter: Payer: Self-pay | Admitting: Internal Medicine

## 2024-06-23 ENCOUNTER — Telehealth: Payer: Self-pay

## 2024-06-23 ENCOUNTER — Encounter: Payer: Self-pay | Admitting: Internal Medicine

## 2024-06-23 NOTE — Telephone Encounter (Signed)
 Please do what she wants in terms of her oxygen  therapy because she is no longer going to have hospice

## 2024-06-23 NOTE — Telephone Encounter (Signed)
 Dr Geronimo, okay to place order? See mychart messages

## 2024-06-23 NOTE — Telephone Encounter (Signed)
 I called and spoke with the pt She states that she has revoked her hospice services, and now needs new DME referral for o2 through [facility]  She states that she is using 8lpm o2 with rest and 10 lpm with exertion   She states that she does not have the time to come in and qualify, but wants us  to go ahead and sent order  Please advise if you are okay with this  She may still have to come in, thanks

## 2024-06-24 ENCOUNTER — Telehealth: Payer: Self-pay | Admitting: Radiation Oncology

## 2024-06-24 NOTE — Telephone Encounter (Signed)
 9/9 @ 12:07 pm called and spoke to patient.  She stated Radiation is not an option at this time -per Dr. Sherrod.  Sent secure chat to Alison/Sarah G., so they are aware.  Closing referral at this time.

## 2024-06-24 NOTE — Telephone Encounter (Signed)
 Was sent a message looks like she needs a 6 minute walk.will route to margie for scheduling,

## 2024-06-25 ENCOUNTER — Encounter: Payer: Self-pay | Admitting: Internal Medicine

## 2024-06-25 ENCOUNTER — Inpatient Hospital Stay

## 2024-06-25 VITALS — BP 119/68 | HR 73 | Temp 97.5°F | Resp 17 | Wt 148.0 lb

## 2024-06-25 DIAGNOSIS — Z85118 Personal history of other malignant neoplasm of bronchus and lung: Secondary | ICD-10-CM | POA: Diagnosis not present

## 2024-06-25 DIAGNOSIS — C7802 Secondary malignant neoplasm of left lung: Secondary | ICD-10-CM

## 2024-06-25 LAB — CBC WITH DIFFERENTIAL (CANCER CENTER ONLY)
Abs Immature Granulocytes: 0.03 K/uL (ref 0.00–0.07)
Basophils Absolute: 0 K/uL (ref 0.0–0.1)
Basophils Relative: 0 %
Eosinophils Absolute: 0.3 K/uL (ref 0.0–0.5)
Eosinophils Relative: 4 %
HCT: 29.3 % — ABNORMAL LOW (ref 36.0–46.0)
Hemoglobin: 9.6 g/dL — ABNORMAL LOW (ref 12.0–15.0)
Immature Granulocytes: 0 %
Lymphocytes Relative: 16 %
Lymphs Abs: 1.3 K/uL (ref 0.7–4.0)
MCH: 29.8 pg (ref 26.0–34.0)
MCHC: 32.8 g/dL (ref 30.0–36.0)
MCV: 91 fL (ref 80.0–100.0)
Monocytes Absolute: 0.6 K/uL (ref 0.1–1.0)
Monocytes Relative: 8 %
Neutro Abs: 5.8 K/uL (ref 1.7–7.7)
Neutrophils Relative %: 72 %
Platelet Count: 394 K/uL (ref 150–400)
RBC: 3.22 MIL/uL — ABNORMAL LOW (ref 3.87–5.11)
RDW: 12.5 % (ref 11.5–15.5)
WBC Count: 8 K/uL (ref 4.0–10.5)
nRBC: 0 % (ref 0.0–0.2)

## 2024-06-25 LAB — CMP (CANCER CENTER ONLY)
ALT: 9 U/L (ref 0–44)
AST: 17 U/L (ref 15–41)
Albumin: 3.8 g/dL (ref 3.5–5.0)
Alkaline Phosphatase: 58 U/L (ref 38–126)
Anion gap: 7 (ref 5–15)
BUN: 12 mg/dL (ref 8–23)
CO2: 29 mmol/L (ref 22–32)
Calcium: 9.3 mg/dL (ref 8.9–10.3)
Chloride: 101 mmol/L (ref 98–111)
Creatinine: 0.63 mg/dL (ref 0.44–1.00)
GFR, Estimated: >60 mL/min (ref >60)
Glucose, Bld: 112 mg/dL — ABNORMAL HIGH (ref 70–99)
Potassium: 4 mmol/L (ref 3.5–5.1)
Sodium: 137 mmol/L (ref 135–145)
Total Bilirubin: 0.3 mg/dL (ref 0.0–1.2)
Total Protein: 8.3 g/dL — ABNORMAL HIGH (ref 6.5–8.1)

## 2024-06-25 LAB — TSH: TSH: 0.737 u[IU]/mL (ref 0.350–4.500)

## 2024-06-25 MED ORDER — SODIUM CHLORIDE 0.9 % IV SOLN: INTRAVENOUS | Status: DC

## 2024-06-25 MED ORDER — SODIUM CHLORIDE 0.9 % IV SOLN
200.0000 mg | Freq: Once | INTRAVENOUS | Status: AC
  Administered 2024-06-25: 200 mg via INTRAVENOUS
  Filled 2024-06-25: qty 200

## 2024-06-25 NOTE — Patient Instructions (Signed)

## 2024-06-25 NOTE — Telephone Encounter (Signed)
 Qualifying walk test is needed.   Spoke to patient and scheduled appt for 06/27/2024. Nothing further needed.

## 2024-06-25 NOTE — Progress Notes (Signed)
 IV d/c'd from R arm.  Site unremarkable.  Pressure dsg applied.

## 2024-06-26 ENCOUNTER — Telehealth: Payer: Self-pay

## 2024-06-26 LAB — T4: T4, Total: 5.8 ug/dL (ref 4.5–12.0)

## 2024-06-26 NOTE — Telephone Encounter (Signed)
 Copied from CRM [id number]. Topic: Clinical - Order For Equipment >> Jun 20, 2024 11:53 AM Celestine FALCON wrote: Reason for CRM: Pt is calling after coming off of hospice for cancer treatments. Pt stated she needs a new order sent to [facility] for DME.   Patient stated she needs 8 L at rest, 10 L with activity. She needs a 10 L home unit with a humidifier. She stated she had 6 D tanks + 6 E tanks, and an M64 backup.   Pt's phone number is [phone] ok to leave a vm. >> Jun 25, 2024  8:16 AM Russell PARAS wrote: Pt is contacting office to speak with [facility] concerning scheduling a 6-min walk test so that she can obtain oxygen  supplies. She is currently using oxygen  supplies provided by hospice; however, she is now coming out of hospice for cancer treatments. Hospice is no longer paying for care and will need their supplies back. She is beginning treatment with Keytruda  today and would like to schedule the walk test as soon as possible, reporting she is unsure how the cancer treatments will affect her and worries she will not be able to complete the walk test if she has to wait.   Requested call back  CB#  [phone], if she does not answer please call husband [name] at # [phone] to schedule testing.   Please see other encounter. Pt is already coming in for a qualifying walk.

## 2024-06-27 ENCOUNTER — Ambulatory Visit

## 2024-06-27 ENCOUNTER — Telehealth: Payer: Self-pay | Admitting: Internal Medicine

## 2024-06-27 DIAGNOSIS — J84112 Idiopathic pulmonary fibrosis: Secondary | ICD-10-CM

## 2024-06-27 DIAGNOSIS — J9611 Chronic respiratory failure with hypoxia: Secondary | ICD-10-CM

## 2024-06-27 NOTE — Telephone Encounter (Signed)
 Spoke to patient and scheduled walk test for 07/01/2024 at 10:00. Offered appt for 09/15 but this did not work with her schedule. Nothing further needed.

## 2024-06-27 NOTE — Telephone Encounter (Signed)
**Note De-identified  [place] Obfuscation** Please advise 

## 2024-06-27 NOTE — Telephone Encounter (Signed)
 Copied from CRM [id number]. Topic: Appointments - Scheduling Inquiry for Clinic >> Jun 27, 2024  8:35 AM Isabell A wrote: Reason for CRM: Patient calling to reschedule 6 min walk test.   Callback number: [phone] >> Jun 27, 2024  8:37 AM Isabell A wrote: Patient states she will run out of oxygen  and wont be able to attend infusion, requesting to reschedule as soon as possible.    What is a good day that there is clinical staff support for this 6 min walk to be scheduled?

## 2024-06-28 NOTE — Progress Notes (Signed)
 [facility] OFFICE PROGRESS NOTE  Diane Leta NOVAK, MD [address] Time [place] [address]  DIAGNOSIS: Unresectable stage IIB (T2a, N1, M0) non-small cell lung cancer, squamous cell carcinoma presented with right lower lobe lung mass in addition to right hilar lymphadenopathy diagnosed in March 2023.     The patient had PD-L1 expression of 99%.   PRIOR THERAPY: A course of concurrent chemoradiation with weekly carboplatin  for AUC of 2 and paclitaxel  45 Mg/M2. First dose started 01/16/2022. Status post 7 cycles. Last dose was given on 02/27/2022.   CURRENT THERAPY: Keytruda  200 mg IV every 3 weeks. Start 06/25/2024. Status post 1 cycle.   INTERVAL HISTORY: Diane [name] 68 y.o. female returns to the clinic today for a follow up visit. She was recently found to have progression. She last saw Dr. Sherrod on 06/18/24. Therefore, she is currently on immunotherapy with Keytruda . She is status post 1 cycle.   She is not feeling well today. She reports new onset cough, right rib chest pain, and increased oxygen  demand.   She developed a new cough approximately 48 hours ago, following her treatment last Wednesday. The cough is minimally productive with phlegm and is less persistent than previous bronchial or pneumonia-related coughs she has experienced.  She experiences chills and night sweats, including an episode where she felt extremely cold and then woke up drenched in sweat. These symptoms have gradually worsened, with significant weakness noted yesterday, making it difficult for her to rise from the couch.  She describes a new sharp chest pain that started in the last 24 hours, located on the right lateral side. The pain is more intense than previous neuropathy pain and is characterized as tender and achy. She denies leg swelling, erythema, or calf pain. She is not on a blood thinner.   She has noticed a significant drop in her oxygen  levels, which are harder to recover. A recent walk  test confirmed that six liters would not maintain her oxygen  levels and she is now using 8 L of oxygen .  She denies any known sick contacts but she reports she has had company come over to the house.  She experiences little waves of nausea but no vomiting, and reports a slight increase in bowel looseness without diarrhea. She has chronic headaches, which have been ongoing for months, but no vision changes.   The patient is here for evaluation and repeat blood work.     MEDICAL HISTORY: Past Medical History:  Diagnosis Date   Anxiety    Arthritis    fingers, left foot   C. difficile diarrhea 2015   COPD (chronic obstructive pulmonary disease) (HCC)    Depression    Eczema    H pylori ulcer    Headache(784.0)    Hypercholesterolemia    Hypertension    Interstitial lung disease (HCC)    Kidney stones    20 years ago   lung ca with recurrence 10/2021   01/2023   Pneumonia    5 years ago   Seizures (HCC)    2 years ago, cluster of seizures none since   Shingles     ALLERGIES:  is allergic to penicillins and alpha-gal.  MEDICATIONS:  Current Outpatient Medications  Medication Sig Dispense Refill   omeprazole  (PRILOSEC) 20 MG capsule Take 1 capsule (20 mg total) by mouth 2 (two) times daily. (Needs to be seen before next refill) 60 capsule 0   Pirfenidone  (ESBRIET ) 267 MG TABS Take 3 tablets (801 mg  total) by mouth 3 (three) times daily with meals. (Account: [email or web address]) 270 tablet 5   acetaminophen  (TYLENOL ) 325 MG tablet Take 2 tablets (650 mg total) by mouth every 4 (four) hours as needed for mild pain (pain score 1-3) or fever (or Fever >/= 101).     albuterol  (PROVENTIL ) (2.5 MG/3ML) 0.083% nebulizer solution Take 3 mLs (2.5 mg total) by nebulization every 2 (two) hours as needed for wheezing or shortness of breath. 75 mL 12   DULoxetine  (CYMBALTA ) 30 MG capsule Take 30 mg by mouth daily.     gabapentin  (NEURONTIN ) 100 MG capsule Take 200 mg by mouth 3  (three) times daily.     guaiFENesin  (MUCINEX ) 600 MG 12 hr tablet Take 1 tablet (600 mg total) by mouth 2 (two) times daily. 60 tablet 2   HYDROcodone  bit-homatropine (HYCODAN) 5-1.5 MG/5ML syrup Take 5 mLs by mouth every 6 (six) hours as needed for cough. 240 mL 0   lidocaine  (LIDODERM ) 5 % Place 1 patch onto the skin daily. Remove & Discard patch within 12 hours or as directed by MD 30 patch 0   lidocaine -prilocaine  (EMLA ) cream Apply to affected area once 30 g 3   loratadine  (CLARITIN ) 10 MG tablet Take 10 mg by mouth daily.     Nebulizers (COMPRESSOR/NEBULIZER) MISC 1 Units by Does not apply route daily as needed. 1 each 0   ondansetron  (ZOFRAN ) 8 MG tablet Take 1 tablet (8 mg total) by mouth every 8 (eight) hours as needed for nausea or vomiting. 30 tablet 1   Polyethyl Glycol-Propyl Glycol 0.4-0.3 % SOLN Place 1 drop into both eyes in the morning and at bedtime.     predniSONE  (DELTASONE ) 10 MG tablet Take 1 tablet (10 mg total) by mouth See admin instructions. Take prednisone  60mg  daily x 2 weeks and then  40 mg daily x 2 weeks then prednisone  30mg  daily x 2 weeks, then 20mg  daily x 2 weeks then 10mg  daily x 2 weeks, then 5mg  daily x 2 weeeks and then 5mg  On MWF x 2 weeks and stop 321.42 tablet 1   Probiotic Product (PROBIOTIC DAILY PO) Take 1 capsule by mouth daily.     prochlorperazine  (COMPAZINE ) 10 MG tablet Take 1 tablet (10 mg total) by mouth every 6 (six) hours as needed for nausea or vomiting. 30 tablet 1   rosuvastatin  (CRESTOR ) 10 MG tablet Take 10 mg by mouth at bedtime.     saline (AYR) GEL Place 1 Application into both nostrils in the morning and at bedtime.     traZODone  (DESYREL ) 100 MG tablet Take 1 tablet (100 mg total) by mouth at bedtime. For Sleep 30 tablet 4   No current facility-administered medications for this visit.    SURGICAL HISTORY:  Past Surgical History:  Procedure Laterality Date   ABDOMINAL HYSTERECTOMY     ADENOIDECTOMY     APPENDECTOMY     BRONCHIAL  BIOPSY  12/20/2021   Procedure: BRONCHIAL BIOPSIES;  Surgeon: Brenna Adine CROME, DO;  Location: [facility];  Service: Pulmonary;;   BRONCHIAL BRUSHINGS  12/20/2021   Procedure: BRONCHIAL BRUSHINGS;  Surgeon: Brenna Adine CROME, DO;  Location: [facility];  Service: Pulmonary;;   BRONCHIAL NEEDLE ASPIRATION BIOPSY  12/20/2021   Procedure: BRONCHIAL NEEDLE ASPIRATION BIOPSIES;  Surgeon: Brenna Adine CROME, DO;  Location: [facility];  Service: Pulmonary;;   CESAREAN SECTION     CHOLECYSTECTOMY     COLONOSCOPY  06/10/2012   RMR: Colonic polyps -removed as described above.  FINE NEEDLE ASPIRATION  12/20/2021   Procedure: FINE NEEDLE ASPIRATION (FNA) LINEAR;  Surgeon: Brenna Adine CROME, DO;  Location: [facility];  Service: Pulmonary;;   KNEE ARTHROSCOPY  1973   right knee,  torn cart   LUMBAR DISC SURGERY  05/12/2015   L4 L5   LUMBAR LAMINECTOMY/DECOMPRESSION MICRODISCECTOMY Left 05/12/2015   Procedure: Left L4-5 Microdiscectomy;  Surgeon: Oneil JAYSON Herald, MD;  Location: [facility];  Service: Orthopedics;  Laterality: Left;   TONSILLECTOMY     VIDEO BRONCHOSCOPY WITH ENDOBRONCHIAL ULTRASOUND Bilateral 12/20/2021   Procedure: VIDEO BRONCHOSCOPY WITH ENDOBRONCHIAL ULTRASOUND;  Surgeon: Brenna Adine CROME, DO;  Location: [facility];  Service: Pulmonary;  Laterality: Bilateral;   VIDEO BRONCHOSCOPY WITH RADIAL ENDOBRONCHIAL ULTRASOUND  12/20/2021   Procedure: RADIAL ENDOBRONCHIAL ULTRASOUND;  Surgeon: Brenna Adine CROME, DO;  Location: [facility];  Service: Pulmonary;;    REVIEW OF SYSTEMS:   Review of Systems  Constitutional: Increased fatigue.  Negative for appetite change, chills, fever and unexpected weight change.  HENT: Negative for mouth sores, nosebleeds, sore throat and trouble swallowing.   Eyes: Negative for eye problems and icterus.  Respiratory: Worsening cough and shortness of breath.  Negative for hemoptysis and wheezing.   Cardiovascular: Negative for chest pain and leg swelling.   Gastrointestinal: Mild intermittent nausea. Negative for abdominal pain, constipation, diarrhea, and vomiting.  Genitourinary: Negative for bladder incontinence, difficulty urinating, dysuria, frequency and hematuria.   Musculoskeletal: Negative for back pain, gait problem, neck pain and neck stiffness.  Skin: Negative for itching and rash.  Neurological: Positive for occasional nausea. Negative for dizziness, extremity weakness, gait problem, light-headedness and seizures.  Hematological: Negative for adenopathy. Does not bruise/bleed easily.  Psychiatric/Behavioral: Negative for confusion, depression and sleep disturbance. The patient is not nervous/anxious.     PHYSICAL EXAMINATION:  Blood pressure 101/60, pulse (!) 110, temperature (!) 97.3 F (36.3 C), temperature source Temporal, resp. rate 20, weight 150 lb 12.8 oz (68.4 kg), SpO2 93%.  ECOG PERFORMANCE STATUS: 1  Physical Exam  Constitutional: Oriented to person, place, and time and chronically ill appearing female and in no distress. HENT:  Head: Normocephalic and atraumatic.  Mouth/Throat: Oropharynx is clear and moist. No oropharyngeal exudate.  Eyes: Conjunctivae are normal. Right eye exhibits no discharge. Left eye exhibits no discharge. No scleral icterus.  Neck: Normal range of motion. Neck supple.  Cardiovascular: Tachycardic, regular rhythm, normal heart sounds and intact distal pulses.   Pulmonary/Chest: Effort normal. Quiet breath sounds bilaterally. No respiratory distress. No wheezes. No rales. On supplemental oxygen  with 8 L.  Abdominal: Soft. Bowel sounds are normal. Exhibits no distension and no mass. There is no tenderness.  Musculoskeletal: Normal range of motion. Exhibits no edema.  Lymphadenopathy:    No cervical adenopathy.  Neurological: Alert and oriented to person, place, and time. Exhibits normal muscle tone. Gait normal. Coordination normal.  Skin: Skin is warm and dry. No rash noted. Not diaphoretic.  No erythema. No pallor.  Psychiatric: Mood, memory and judgment normal.  Vitals reviewed.  LABORATORY DATA: Lab Results  Component Value Date   WBC 8.8 07/02/2024   HGB 9.1 (L) 07/02/2024   HCT 28.2 (L) 07/02/2024   MCV 90.7 07/02/2024   PLT 361 07/02/2024      Chemistry      Component Value Date/Time   NA 136 07/02/2024 1428   NA 139 12/03/2019 0928   K 4.6 07/02/2024 1428   CL 101 07/02/2024 1428   CO2 31 07/02/2024 1428   BUN  10 07/02/2024 1428   BUN 14 12/03/2019 0928   CREATININE 0.58 07/02/2024 1428   CREATININE 0.64 10/14/2021 0953      Component Value Date/Time   CALCIUM  9.1 07/02/2024 1428   ALKPHOS 66 07/02/2024 1428   AST 19 07/02/2024 1428   ALT 12 07/02/2024 1428   BILITOT 0.3 07/02/2024 1428       RADIOGRAPHIC STUDIES:  MR BRAIN W WO CONTRAST Result Date: 06/11/2024 CLINICAL DATA:  Provided history: Malignant neoplasm of unspecified part of unspecified bronchus or lung. Non-small cell lung cancer, staging. EXAM: MRI HEAD WITHOUT AND WITH CONTRAST TECHNIQUE: Multiplanar, multiecho pulse sequences of the brain and surrounding structures were obtained without and with intravenous contrast. CONTRAST:  7mL GADAVIST  GADOBUTROL  1 MMOL/ML IV SOLN COMPARISON:  Brain MRI 01/03/2022. FINDINGS: Brain: No age-advanced or lobar predominant cerebral atrophy. Multifocal T2 FLAIR hyperintense signal abnormality within the cerebral white matter, nonspecific but compatible with minimal chronic small vessel ischemic disease. Few nonspecific chronic microhemorrhages scattered within the supratentorial brain. No cortical encephalomalacia is identified. There is no acute infarct. No evidence of an intracranial mass. No extra-axial fluid collection. No midline shift. No pathologic intracranial enhancement identified. Vascular: Maintained flow voids within the proximal large arterial vessels. Skull and upper cervical spine: No focal worrisome marrow lesion. Sinuses/Orbits: No mass or  acute finding within the imaged orbits. Small mucous retention cyst within the left maxillary sinus. IMPRESSION: 1. No evidence of intracranial metastatic disease. 2. Minor chronic small vessel ischemic changes within the cerebral white matter, similar to the prior MRI of 01/03/2022. 3. Small left maxillary sinus mucous retention cyst. Electronically Signed   By: Rockey Childs D.O.   On: 06/11/2024 11:02     ASSESSMENT/PLAN:  This is a very pleasant 68 years old white female initially diagnosed with unresectable stage IIb (T2 a, N1, M0) non-small cell lung cancer, squamous cell carcinoma with right lower lobe lung mass in addition to right hilar lymphadenopathy in March 2023. The patient has PD-L1 expression of 99%.   She completed a course of concurrent chemoradiation with weekly carboplatin  for AUC of 2 and paclitaxel  45 Mg/M2 status post 7 cycles. Last dose was given on 02/27/2022.   She was found to have progression with multifocal areas of new metabolic activity, mass-like consolidation, new left lower lobe subpleural nodule, bilateral hilar adenopathy, and right level 4 cervical lymph nodes. Biopsy is not performed due to high risk. PD-L1 expression is 99%, indicating candidacy for immune therapy.   She started keytruda  200 mg IV every 3 weeks. First dose around 06/25/24. She is status post 1 cycle.   Today she reports new acute onset of right lateral chest discomfort, increased oxygen  demand, night sweats, chills, and cough.  The patient is concerned about her condition deteriorating as she reports this is new for her.  She reports that chest x-rays are not accurate due to the prior scarring in her lungs.  Shared decision making she was in favor of ER evaluation.  Would recommend considering stat CT/CTA.  Differential includes pneumonia, COPD exacerbation, PE, pneumonitis from her immunotherapy with Keytruda , viral infection, etc.  If concern for immunotherapy pneumonitis, treatment would be  high-dose steroid taper 1 mg/kg.   We will see her back in 2 weeks before her next cycle of treatment and for follow-up with Dr. Sherrod.    The patient was advised to call immediately if she has any concerning symptoms in the interval. The patient voices understanding of current disease status and  treatment options and is in agreement with the current care plan. All questions were answered. The patient knows to call the clinic with any problems, questions or concerns. We can certainly see the patient much sooner if necessary    No orders of the defined types were placed in this encounter.   The total time spent in the appointment was 30-35 minutes  Talitha Dicarlo L Antionio Negron, PA-C 07/02/24

## 2024-06-30 ENCOUNTER — Encounter: Payer: Self-pay | Admitting: Internal Medicine

## 2024-07-01 ENCOUNTER — Other Ambulatory Visit: Payer: Self-pay | Admitting: Physician Assistant

## 2024-07-01 ENCOUNTER — Ambulatory Visit (INDEPENDENT_AMBULATORY_CARE_PROVIDER_SITE_OTHER): Admitting: Internal Medicine

## 2024-07-01 DIAGNOSIS — C7801 Secondary malignant neoplasm of right lung: Secondary | ICD-10-CM

## 2024-07-01 DIAGNOSIS — J9611 Chronic respiratory failure with hypoxia: Secondary | ICD-10-CM

## 2024-07-01 NOTE — Telephone Encounter (Signed)
 Pt in office for walk test. Pt qualified for 8L with exertion.  Order has been placed.  Please see 06/23/2024 phone note.  Nothing further needed.

## 2024-07-01 NOTE — Addendum Note (Signed)
 Addended by: CLAUDENE NEVINS A on: 07/01/2024 11:07 AM   Modules accepted: Orders

## 2024-07-01 NOTE — Telephone Encounter (Signed)
 Yes this is fine.

## 2024-07-01 NOTE — Progress Notes (Unsigned)
 Pt in office for walk test.     Supplemental oxygen  during test? No Yes Yes  O2 Flow Rate (L/min) -- 6 L/min 8 L/min  Type -- Continuous Continuous  FiO2 (%) -- -- --  Resting Heart Rate 99 98 97  Resting Sp02 84 92 94  Lap 1 (250 feet)   HR -- 100 97  02 Sat     6 minute walk results: Pt was walked on RA in the office 9/16  RA at rest sats dropped to 84% RA with ambulation sats dropped to 84% 8 L ambulating sats were maintained at 92% Orders for oxygen  have been placed  Diane PHEBE Lites, MSN, AGACNP-BC [facility] See Amion for personal pager PCCM on call pager [phone]  07/03/2024

## 2024-07-02 ENCOUNTER — Encounter: Payer: Self-pay | Admitting: Internal Medicine

## 2024-07-02 ENCOUNTER — Inpatient Hospital Stay (HOSPITAL_BASED_OUTPATIENT_CLINIC_OR_DEPARTMENT_OTHER): Admitting: Physician Assistant

## 2024-07-02 ENCOUNTER — Encounter (HOSPITAL_COMMUNITY): Payer: Self-pay

## 2024-07-02 ENCOUNTER — Inpatient Hospital Stay (HOSPITAL_COMMUNITY)
Admission: EM | Admit: 2024-07-02 | Discharge: 2024-07-16 | DRG: 177 | Disposition: E | Attending: Internal Medicine | Admitting: Internal Medicine

## 2024-07-02 ENCOUNTER — Other Ambulatory Visit: Payer: Self-pay

## 2024-07-02 ENCOUNTER — Telehealth: Payer: Self-pay | Admitting: Acute Care

## 2024-07-02 ENCOUNTER — Emergency Department (HOSPITAL_COMMUNITY)

## 2024-07-02 ENCOUNTER — Inpatient Hospital Stay

## 2024-07-02 VITALS — BP 101/60 | HR 110 | Temp 97.3°F | Resp 20 | Wt 150.8 lb

## 2024-07-02 DIAGNOSIS — J8410 Pulmonary fibrosis, unspecified: Secondary | ICD-10-CM | POA: Diagnosis not present

## 2024-07-02 DIAGNOSIS — Z860100 Personal history of colon polyps, unspecified: Secondary | ICD-10-CM

## 2024-07-02 DIAGNOSIS — C7802 Secondary malignant neoplasm of left lung: Secondary | ICD-10-CM | POA: Diagnosis present

## 2024-07-02 DIAGNOSIS — Z711 Person with feared health complaint in whom no diagnosis is made: Secondary | ICD-10-CM

## 2024-07-02 DIAGNOSIS — J189 Pneumonia, unspecified organism: Secondary | ICD-10-CM | POA: Diagnosis not present

## 2024-07-02 DIAGNOSIS — Z8701 Personal history of pneumonia (recurrent): Secondary | ICD-10-CM

## 2024-07-02 DIAGNOSIS — Z7189 Other specified counseling: Secondary | ICD-10-CM

## 2024-07-02 DIAGNOSIS — J9621 Acute and chronic respiratory failure with hypoxia: Secondary | ICD-10-CM | POA: Diagnosis present

## 2024-07-02 DIAGNOSIS — J449 Chronic obstructive pulmonary disease, unspecified: Secondary | ICD-10-CM | POA: Diagnosis not present

## 2024-07-02 DIAGNOSIS — Z90710 Acquired absence of both cervix and uterus: Secondary | ICD-10-CM

## 2024-07-02 DIAGNOSIS — Z880 Allergy status to penicillin: Secondary | ICD-10-CM | POA: Diagnosis not present

## 2024-07-02 DIAGNOSIS — I10 Essential (primary) hypertension: Secondary | ICD-10-CM | POA: Diagnosis present

## 2024-07-02 DIAGNOSIS — C4492 Squamous cell carcinoma of skin, unspecified: Secondary | ICD-10-CM

## 2024-07-02 DIAGNOSIS — Z1152 Encounter for screening for COVID-19: Secondary | ICD-10-CM | POA: Diagnosis not present

## 2024-07-02 DIAGNOSIS — Z818 Family history of other mental and behavioral disorders: Secondary | ICD-10-CM | POA: Diagnosis not present

## 2024-07-02 DIAGNOSIS — F32A Depression, unspecified: Secondary | ICD-10-CM | POA: Diagnosis present

## 2024-07-02 DIAGNOSIS — J984 Other disorders of lung: Secondary | ICD-10-CM | POA: Diagnosis not present

## 2024-07-02 DIAGNOSIS — Z87891 Personal history of nicotine dependence: Secondary | ICD-10-CM

## 2024-07-02 DIAGNOSIS — J9611 Chronic respiratory failure with hypoxia: Secondary | ICD-10-CM | POA: Diagnosis not present

## 2024-07-02 DIAGNOSIS — Z8619 Personal history of other infectious and parasitic diseases: Secondary | ICD-10-CM

## 2024-07-02 DIAGNOSIS — Z9981 Dependence on supplemental oxygen: Secondary | ICD-10-CM | POA: Diagnosis not present

## 2024-07-02 DIAGNOSIS — F419 Anxiety disorder, unspecified: Secondary | ICD-10-CM | POA: Diagnosis present

## 2024-07-02 DIAGNOSIS — C7801 Secondary malignant neoplasm of right lung: Secondary | ICD-10-CM | POA: Diagnosis present

## 2024-07-02 DIAGNOSIS — Z825 Family history of asthma and other chronic lower respiratory diseases: Secondary | ICD-10-CM

## 2024-07-02 DIAGNOSIS — J84112 Idiopathic pulmonary fibrosis: Secondary | ICD-10-CM | POA: Diagnosis present

## 2024-07-02 DIAGNOSIS — Z9049 Acquired absence of other specified parts of digestive tract: Secondary | ICD-10-CM

## 2024-07-02 DIAGNOSIS — G629 Polyneuropathy, unspecified: Secondary | ICD-10-CM | POA: Diagnosis present

## 2024-07-02 DIAGNOSIS — Z9221 Personal history of antineoplastic chemotherapy: Secondary | ICD-10-CM | POA: Diagnosis not present

## 2024-07-02 DIAGNOSIS — Z79899 Other long term (current) drug therapy: Secondary | ICD-10-CM | POA: Diagnosis not present

## 2024-07-02 DIAGNOSIS — Z66 Do not resuscitate: Secondary | ICD-10-CM

## 2024-07-02 DIAGNOSIS — J1569 Pneumonia due to other gram-negative bacteria: Secondary | ICD-10-CM | POA: Diagnosis present

## 2024-07-02 DIAGNOSIS — R059 Cough, unspecified: Secondary | ICD-10-CM | POA: Diagnosis present

## 2024-07-02 DIAGNOSIS — K5900 Constipation, unspecified: Secondary | ICD-10-CM | POA: Diagnosis present

## 2024-07-02 DIAGNOSIS — C3490 Malignant neoplasm of unspecified part of unspecified bronchus or lung: Secondary | ICD-10-CM

## 2024-07-02 DIAGNOSIS — Z515 Encounter for palliative care: Secondary | ICD-10-CM | POA: Diagnosis not present

## 2024-07-02 DIAGNOSIS — J440 Chronic obstructive pulmonary disease with acute lower respiratory infection: Secondary | ICD-10-CM | POA: Diagnosis present

## 2024-07-02 DIAGNOSIS — C3431 Malignant neoplasm of lower lobe, right bronchus or lung: Secondary | ICD-10-CM | POA: Diagnosis present

## 2024-07-02 DIAGNOSIS — Z8249 Family history of ischemic heart disease and other diseases of the circulatory system: Secondary | ICD-10-CM

## 2024-07-02 DIAGNOSIS — E7800 Pure hypercholesterolemia, unspecified: Secondary | ICD-10-CM | POA: Diagnosis present

## 2024-07-02 DIAGNOSIS — R5381 Other malaise: Secondary | ICD-10-CM | POA: Diagnosis present

## 2024-07-02 DIAGNOSIS — Z87442 Personal history of urinary calculi: Secondary | ICD-10-CM

## 2024-07-02 DIAGNOSIS — D630 Anemia in neoplastic disease: Secondary | ICD-10-CM | POA: Diagnosis present

## 2024-07-02 DIAGNOSIS — R918 Other nonspecific abnormal finding of lung field: Secondary | ICD-10-CM | POA: Diagnosis not present

## 2024-07-02 DIAGNOSIS — M1990 Unspecified osteoarthritis, unspecified site: Secondary | ICD-10-CM | POA: Diagnosis present

## 2024-07-02 DIAGNOSIS — R0602 Shortness of breath: Secondary | ICD-10-CM

## 2024-07-02 DIAGNOSIS — Z923 Personal history of irradiation: Secondary | ICD-10-CM

## 2024-07-02 DIAGNOSIS — T50905A Adverse effect of unspecified drugs, medicaments and biological substances, initial encounter: Secondary | ICD-10-CM | POA: Diagnosis not present

## 2024-07-02 DIAGNOSIS — Z83438 Family history of other disorder of lipoprotein metabolism and other lipidemia: Secondary | ICD-10-CM

## 2024-07-02 DIAGNOSIS — J439 Emphysema, unspecified: Secondary | ICD-10-CM | POA: Diagnosis present

## 2024-07-02 DIAGNOSIS — R590 Localized enlarged lymph nodes: Secondary | ICD-10-CM | POA: Diagnosis present

## 2024-07-02 DIAGNOSIS — Z8261 Family history of arthritis: Secondary | ICD-10-CM

## 2024-07-02 LAB — CBC WITH DIFFERENTIAL (CANCER CENTER ONLY)
Abs Immature Granulocytes: 0.05 K/uL (ref 0.00–0.07)
Basophils Absolute: 0 K/uL (ref 0.0–0.1)
Basophils Relative: 1 %
Eosinophils Absolute: 0.5 K/uL (ref 0.0–0.5)
Eosinophils Relative: 6 %
HCT: 28.2 % — ABNORMAL LOW (ref 36.0–46.0)
Hemoglobin: 9.1 g/dL — ABNORMAL LOW (ref 12.0–15.0)
Immature Granulocytes: 1 %
Lymphocytes Relative: 11 %
Lymphs Abs: 1 K/uL (ref 0.7–4.0)
MCH: 29.3 pg (ref 26.0–34.0)
MCHC: 32.3 g/dL (ref 30.0–36.0)
MCV: 90.7 fL (ref 80.0–100.0)
Monocytes Absolute: 0.6 K/uL (ref 0.1–1.0)
Monocytes Relative: 7 %
Neutro Abs: 6.6 K/uL (ref 1.7–7.7)
Neutrophils Relative %: 74 %
Platelet Count: 361 K/uL (ref 150–400)
RBC: 3.11 MIL/uL — ABNORMAL LOW (ref 3.87–5.11)
RDW: 12.7 % (ref 11.5–15.5)
WBC Count: 8.8 K/uL (ref 4.0–10.5)
nRBC: 0 % (ref 0.0–0.2)

## 2024-07-02 LAB — RESP PANEL BY RT-PCR (RSV, FLU A&B, COVID)  RVPGX2
Influenza A by PCR: NEGATIVE
Influenza B by PCR: NEGATIVE
Resp Syncytial Virus by PCR: NEGATIVE
SARS Coronavirus 2 by RT PCR: NEGATIVE

## 2024-07-02 LAB — CBC WITH DIFFERENTIAL/PLATELET
Abs Immature Granulocytes: 0.04 K/uL (ref 0.00–0.07)
Basophils Absolute: 0.1 K/uL (ref 0.0–0.1)
Basophils Relative: 1 %
Eosinophils Absolute: 0.5 K/uL (ref 0.0–0.5)
Eosinophils Relative: 5 %
HCT: 29.4 % — ABNORMAL LOW (ref 36.0–46.0)
Hemoglobin: 9.1 g/dL — ABNORMAL LOW (ref 12.0–15.0)
Immature Granulocytes: 0 %
Lymphocytes Relative: 12 %
Lymphs Abs: 1.1 K/uL (ref 0.7–4.0)
MCH: 29.3 pg (ref 26.0–34.0)
MCHC: 31 g/dL (ref 30.0–36.0)
MCV: 94.5 fL (ref 80.0–100.0)
Monocytes Absolute: 0.8 K/uL (ref 0.1–1.0)
Monocytes Relative: 8 %
Neutro Abs: 6.8 K/uL (ref 1.7–7.7)
Neutrophils Relative %: 74 %
Platelets: 372 K/uL (ref 150–400)
RBC: 3.11 MIL/uL — ABNORMAL LOW (ref 3.87–5.11)
RDW: 12.7 % (ref 11.5–15.5)
WBC: 9.2 K/uL (ref 4.0–10.5)
nRBC: 0 % (ref 0.0–0.2)

## 2024-07-02 LAB — CMP (CANCER CENTER ONLY)
ALT: 12 U/L (ref 0–44)
AST: 19 U/L (ref 15–41)
Albumin: 3.5 g/dL (ref 3.5–5.0)
Alkaline Phosphatase: 66 U/L (ref 38–126)
Anion gap: 4 — ABNORMAL LOW (ref 5–15)
BUN: 10 mg/dL (ref 8–23)
CO2: 31 mmol/L (ref 22–32)
Calcium: 9.1 mg/dL (ref 8.9–10.3)
Chloride: 101 mmol/L (ref 98–111)
Creatinine: 0.58 mg/dL (ref 0.44–1.00)
GFR, Estimated: >60 mL/min (ref >60)
Glucose, Bld: 124 mg/dL — ABNORMAL HIGH (ref 70–99)
Potassium: 4.6 mmol/L (ref 3.5–5.1)
Sodium: 136 mmol/L (ref 135–145)
Total Bilirubin: 0.3 mg/dL (ref 0.0–1.2)
Total Protein: 7.8 g/dL (ref 6.5–8.1)

## 2024-07-02 LAB — BASIC METABOLIC PANEL WITH GFR
Anion gap: 11 (ref 5–15)
BUN: 10 mg/dL (ref 8–23)
CO2: 27 mmol/L (ref 22–32)
Calcium: 9.6 mg/dL (ref 8.9–10.3)
Chloride: 100 mmol/L (ref 98–111)
Creatinine, Ser: 0.56 mg/dL (ref 0.44–1.00)
GFR, Estimated: >60 mL/min (ref >60)
Glucose, Bld: 95 mg/dL (ref 70–99)
Potassium: 4.4 mmol/L (ref 3.5–5.1)
Sodium: 137 mmol/L (ref 135–145)

## 2024-07-02 LAB — PRO BRAIN NATRIURETIC PEPTIDE: Pro Brain Natriuretic Peptide: 236 pg/mL (ref <300.0)

## 2024-07-02 MED ORDER — SODIUM CHLORIDE 0.9% FLUSH: 3.0000 mL | INTRAVENOUS | Status: DC | PRN

## 2024-07-02 MED ORDER — SODIUM CHLORIDE 0.9 % IV SOLN: 250.0000 mL | INTRAVENOUS | Status: AC | PRN

## 2024-07-02 MED ORDER — DOXYCYCLINE HYCLATE 100 MG PO TABS
100.0000 mg | ORAL_TABLET | Freq: Once | ORAL | Status: AC
  Administered 2024-07-02: 100 mg via ORAL
  Filled 2024-07-02: qty 1

## 2024-07-02 MED ORDER — GABAPENTIN 100 MG PO CAPS
200.0000 mg | ORAL_CAPSULE | Freq: Once | ORAL | Status: AC
  Administered 2024-07-02: 200 mg via ORAL
  Filled 2024-07-02: qty 2

## 2024-07-02 MED ORDER — GUAIFENESIN ER 600 MG PO TB12
600.0000 mg | ORAL_TABLET | Freq: Two times a day (BID) | ORAL | Status: DC
Start: 2024-07-02 — End: 2024-07-04
  Administered 2024-07-02 – 2024-07-03 (×3): 600 mg via ORAL
  Filled 2024-07-02 (×4): qty 1

## 2024-07-02 MED ORDER — GABAPENTIN 400 MG PO CAPS
400.0000 mg | ORAL_CAPSULE | Freq: Three times a day (TID) | ORAL | Status: DC
  Administered 2024-07-03 – 2024-07-13 (×31): 400 mg via ORAL
  Filled 2024-07-02 (×31): qty 1

## 2024-07-02 MED ORDER — SODIUM CHLORIDE 0.9% FLUSH
3.0000 mL | Freq: Two times a day (BID) | INTRAVENOUS | Status: DC
Start: 2024-07-02 — End: 2024-07-13
  Administered 2024-07-02 – 2024-07-13 (×19): 3 mL via INTRAVENOUS

## 2024-07-02 MED ORDER — TRAZODONE HCL 100 MG PO TABS
100.0000 mg | ORAL_TABLET | Freq: Every day | ORAL | Status: DC
Start: 2024-07-02 — End: 2024-07-13
  Administered 2024-07-02 – 2024-07-12 (×11): 100 mg via ORAL
  Filled 2024-07-02 (×6): qty 1
  Filled 2024-07-02: qty 2
  Filled 2024-07-02 (×4): qty 1

## 2024-07-02 MED ORDER — ORAL CARE MOUTH RINSE: 15.0000 mL | OROMUCOSAL | Status: DC | PRN

## 2024-07-02 MED ORDER — DULOXETINE HCL 30 MG PO CPEP
30.0000 mg | ORAL_CAPSULE | Freq: Every day | ORAL | Status: DC
  Administered 2024-07-03 – 2024-07-11 (×9): 30 mg via ORAL
  Filled 2024-07-02 (×11): qty 1

## 2024-07-02 MED ORDER — ONDANSETRON HCL 4 MG PO TABS: 4.0000 mg | ORAL_TABLET | Freq: Four times a day (QID) | ORAL | Status: DC | PRN

## 2024-07-02 MED ORDER — DOXYCYCLINE HYCLATE 100 MG PO TABS
100.0000 mg | ORAL_TABLET | Freq: Two times a day (BID) | ORAL | Status: DC
  Administered 2024-07-03 – 2024-07-09 (×13): 100 mg via ORAL
  Filled 2024-07-02 (×13): qty 1

## 2024-07-02 MED ORDER — GUAIFENESIN ER 600 MG PO TB12: 600.0000 mg | ORAL_TABLET | Freq: Two times a day (BID) | ORAL | Status: DC

## 2024-07-02 MED ORDER — HYDROCODONE BIT-HOMATROP MBR 5-1.5 MG/5ML PO SOLN
5.0000 mL | Freq: Four times a day (QID) | ORAL | Status: DC | PRN
  Administered 2024-07-03 – 2024-07-13 (×10): 5 mL via ORAL
  Filled 2024-07-02 (×10): qty 5

## 2024-07-02 MED ORDER — PIRFENIDONE 267 MG PO TABS
801.0000 mg | ORAL_TABLET | Freq: Three times a day (TID) | ORAL | Status: DC
Start: 2024-07-03 — End: 2024-07-10
  Administered 2024-07-08: 801 mg via ORAL
  Filled 2024-07-02 (×6): qty 3

## 2024-07-02 MED ORDER — GABAPENTIN 100 MG PO CAPS
200.0000 mg | ORAL_CAPSULE | Freq: Three times a day (TID) | ORAL | Status: DC
  Administered 2024-07-02: 200 mg via ORAL
  Filled 2024-07-02: qty 2

## 2024-07-02 MED ORDER — ALBUTEROL SULFATE (2.5 MG/3ML) 0.083% IN NEBU
2.5000 mg | INHALATION_SOLUTION | Freq: Four times a day (QID) | RESPIRATORY_TRACT | Status: DC
  Administered 2024-07-03: 2.5 mg via RESPIRATORY_TRACT
  Filled 2024-07-02: qty 3

## 2024-07-02 MED ORDER — IPRATROPIUM BROMIDE 0.02 % IN SOLN
0.5000 mg | Freq: Four times a day (QID) | RESPIRATORY_TRACT | Status: DC
  Administered 2024-07-03: 0.5 mg via RESPIRATORY_TRACT
  Filled 2024-07-02: qty 2.5

## 2024-07-02 MED ORDER — ROSUVASTATIN CALCIUM 10 MG PO TABS
10.0000 mg | ORAL_TABLET | Freq: Every day | ORAL | Status: DC
  Administered 2024-07-02 – 2024-07-08 (×7): 10 mg via ORAL
  Filled 2024-07-02 (×7): qty 1

## 2024-07-02 MED ORDER — CEFTRIAXONE SODIUM 1 G IJ SOLR
1.0000 g | Freq: Once | INTRAMUSCULAR | Status: AC
  Administered 2024-07-02: 1 g via INTRAVENOUS
  Filled 2024-07-02: qty 10

## 2024-07-02 MED ORDER — PANTOPRAZOLE SODIUM 40 MG PO TBEC
40.0000 mg | DELAYED_RELEASE_TABLET | Freq: Every day | ORAL | Status: DC
  Administered 2024-07-03 – 2024-07-09 (×7): 40 mg via ORAL
  Filled 2024-07-02 (×7): qty 1

## 2024-07-02 MED ORDER — ONDANSETRON HCL 4 MG/2ML IJ SOLN: 4.0000 mg | Freq: Four times a day (QID) | INTRAMUSCULAR | Status: DC | PRN

## 2024-07-02 MED ORDER — PROCHLORPERAZINE MALEATE 10 MG PO TABS
10.0000 mg | ORAL_TABLET | Freq: Four times a day (QID) | ORAL | Status: DC | PRN
Start: 2024-07-02 — End: 2024-07-13

## 2024-07-02 MED ORDER — SODIUM CHLORIDE 0.9 % IV SOLN
1.0000 g | INTRAVENOUS | Status: DC
  Administered 2024-07-03: 1 g via INTRAVENOUS
  Filled 2024-07-02 (×2): qty 10

## 2024-07-02 MED ORDER — IOHEXOL 350 MG/ML SOLN
75.0000 mL | Freq: Once | INTRAVENOUS | Status: AC | PRN
  Administered 2024-07-02: 75 mL via INTRAVENOUS

## 2024-07-02 MED ORDER — ACETAMINOPHEN 325 MG PO TABS
650.0000 mg | ORAL_TABLET | ORAL | Status: DC | PRN
Start: 2024-07-02 — End: 2024-07-13
  Administered 2024-07-03 – 2024-07-09 (×9): 650 mg via ORAL
  Filled 2024-07-02 (×9): qty 2

## 2024-07-02 MED ORDER — CHLORHEXIDINE GLUCONATE CLOTH 2 % EX PADS
6.0000 | MEDICATED_PAD | Freq: Every day | CUTANEOUS | Status: DC
  Administered 2024-07-02 – 2024-07-06 (×3): 6 via TOPICAL

## 2024-07-02 NOTE — Telephone Encounter (Signed)
 Has been completed

## 2024-07-02 NOTE — Progress Notes (Signed)
     Patient Name: Diane [name]           DOB: 1955-10-25  MRN: [id number]      Admission Date: 07/02/2024  Attending Provider: Drusilla Sabas RAMAN, MD  Primary Diagnosis: Pneumonia   Level of care: Stepdown   OVERNIGHT EVENT   Patient requesting CODE STATUS change. At bedside, patient is A/O x 4.  She has a clear understanding of her health and illness.  We discussed CODE STATUS, and interventions that occur as a full code vs DNR and/or DNI.  She states that she wants to be DNR/DNI.  She has discussed this previously with her family, as well as her pulmonologist. CODE STATUS has been changed to DNR/DNI.  RN has been notified of this change.   Eda Magnussen, DNP, ACNPC- AG [facility] [place]

## 2024-07-02 NOTE — Telephone Encounter (Signed)
 Per Avelina with [employer]- Can we possible have the provider co-sign the sats entered by the nurse OR have provider Ruthell Lauraine FALCON, NP copy and paste the nurse sats in her note from 06/11/2024?

## 2024-07-02 NOTE — ED Triage Notes (Signed)
 Pt was at [facility] center today for follow up. Pt had stage 4 lung CA and doing immuno therapy for the past week. Started to have tachycardia, right side chest pain, chills, night sweats, cough and fatigue, so they sent her here for evaluation. Pt is on 8L [place] all the time.

## 2024-07-02 NOTE — Progress Notes (Signed)
 Antibiotics started prior to blood cultures being drawn in ED. Provider Abby NP aware and blood cultures still requested. Communicated to lab and phlebotomy has drawn cultures.

## 2024-07-02 NOTE — H&P (Signed)
 [facility] H&P    Patient Demographics:    Diane [name], is a 68 y.o. female  MRN: [id number]  DOB - January 10, 1956  Admit Date - 07/02/2024  Referring MD/NP/PA: Donnice Hughes  Outpatient Primary MD for the patient is [name], MD  Patient coming from: Oncology office  Chief complaint-cough   HPI:    Barrett Holthaus  is a 68 y.o. female, with medical history of COPD, pulmonary fibrosis on non-small cell lung cancer, stage II cancer, on Keytruda , chronically on 8 L of oxygen  via nasal cannula at baseline, presented from oncology office with concern for feeding generally weak, coughing, and right sided pleuritic chest pain.  Patient says that she has usually dry cough but this has become productive.  She denies fever but admits to having chills.  Denies nausea vomiting or diarrhea.  Denies dysuria or abdominal pain.  Patient smoked for 50 years.  Quit smoking before she was diagnosed with cancer. Patient went to oncology office and was referred to ED for further evaluation. In the ED, CT chest was done which was negative for pulmonary embolism.  Showed right perihilar masslike opacity, concerning for consolidation. Patient started on Rocephin  and doxycycline .    Review of systems:    In addition to the HPI above,    All other systems reviewed and are negative.    Past History of the following :    Past Medical History:  Diagnosis Date   Anxiety    Arthritis    fingers, left foot   C. difficile diarrhea 2015   COPD (chronic obstructive pulmonary disease) (HCC)    Depression    Eczema    H pylori ulcer    Headache(784.0)    Hypercholesterolemia    Hypertension    Interstitial lung disease (HCC)    Kidney stones    20 years ago   lung ca with recurrence 10/2021   01/2023   Pneumonia    5 years ago   Seizures (HCC)    2 years ago, cluster of seizures none since   Shingles        Past Surgical History:  Procedure Laterality Date   ABDOMINAL HYSTERECTOMY     ADENOIDECTOMY     APPENDECTOMY     BRONCHIAL BIOPSY  12/20/2021   Procedure: BRONCHIAL BIOPSIES;  Surgeon: Brenna Adine CROME, DO;  Location: [facility];  Service: Pulmonary;;   BRONCHIAL BRUSHINGS  12/20/2021   Procedure: BRONCHIAL BRUSHINGS;  Surgeon: Brenna Adine CROME, DO;  Location: [facility];  Service: Pulmonary;;   BRONCHIAL NEEDLE ASPIRATION BIOPSY  12/20/2021   Procedure: BRONCHIAL NEEDLE ASPIRATION BIOPSIES;  Surgeon: Brenna Adine CROME, DO;  Location: [facility];  Service: Pulmonary;;   CESAREAN SECTION     CHOLECYSTECTOMY     COLONOSCOPY  06/10/2012   RMR: Colonic polyps -removed as described above.    FINE NEEDLE ASPIRATION  12/20/2021   Procedure: FINE NEEDLE ASPIRATION (FNA) LINEAR;  Surgeon: Brenna Adine CROME, DO;  Location: [facility];  Service: Pulmonary;;   KNEE ARTHROSCOPY  1973  right knee,  torn cart   LUMBAR DISC SURGERY  05/12/2015   L4 L5   LUMBAR LAMINECTOMY/DECOMPRESSION MICRODISCECTOMY Left 05/12/2015   Procedure: Left L4-5 Microdiscectomy;  Surgeon: Oneil JAYSON Herald, MD;  Location: [facility] OR;  Service: Orthopedics;  Laterality: Left;   TONSILLECTOMY     VIDEO BRONCHOSCOPY WITH ENDOBRONCHIAL ULTRASOUND Bilateral 12/20/2021   Procedure: VIDEO BRONCHOSCOPY WITH ENDOBRONCHIAL ULTRASOUND;  Surgeon: Brenna Adine CROME, DO;  Location: [facility];  Service: Pulmonary;  Laterality: Bilateral;   VIDEO BRONCHOSCOPY WITH RADIAL ENDOBRONCHIAL ULTRASOUND  12/20/2021   Procedure: RADIAL ENDOBRONCHIAL ULTRASOUND;  Surgeon: Brenna Adine CROME, DO;  Location: [facility];  Service: Pulmonary;;      Social History:      Social History   Tobacco Use   Smoking status: Former    Current packs/day: 0.00    Average packs/day: 0.5 packs/day for 30.0 years (15.0 ttl pk-yrs)    Types: Cigarettes    Start date: 10/10/1991    Quit date: 10/09/2021    Years since quitting: 2.7   Smokeless tobacco: Never    Tobacco comments:    5-10 cigarettes a day (plans to quit prior to this surgery 05/06/15)  Substance Use Topics   Alcohol use: Yes    Alcohol/week: 3.0 standard drinks of alcohol    Types: 3 Shots of liquor per week    Comment: 2x week       Family History :     Family History  Problem Relation Age of Onset   Arthritis Mother    Asthma Mother    Depression Mother    Hyperlipidemia Mother    Varicose Veins Mother    Arthritis Father    Heart disease Father    Hyperlipidemia Father    Vision loss Father    Cancer Sister    COPD Sister    Early death Sister    Cancer Brother    Alcohol abuse Maternal Grandfather    Colon cancer Neg Hx       Home Medications:   Prior to Admission medications   Medication Sig Start Date End Date Taking? Authorizing Provider  acetaminophen  (TYLENOL ) 325 MG tablet Take 2 tablets (650 mg total) by mouth every 4 (four) hours as needed for mild pain (pain score 1-3) or fever (or Fever >/= 101). 04/01/24   Pearlean Manus, MD  albuterol  (PROVENTIL ) (2.5 MG/3ML) 0.083% nebulizer solution Take 3 mLs (2.5 mg total) by nebulization every 2 (two) hours as needed for wheezing or shortness of breath. 04/01/24   Pearlean Manus, MD  DULoxetine  (CYMBALTA ) 30 MG capsule Take 30 mg by mouth daily. 03/06/24   [provider]  gabapentin  (NEURONTIN ) 100 MG capsule Take 200 mg by mouth 3 (three) times daily. 03/06/24   [provider]  guaiFENesin  (MUCINEX ) 600 MG 12 hr tablet Take 1 tablet (600 mg total) by mouth 2 (two) times daily. 04/01/24 04/01/25  Pearlean Manus, MD  HYDROcodone  bit-homatropine (HYCODAN) 5-1.5 MG/5ML syrup Take 5 mLs by mouth every 6 (six) hours as needed for cough. 06/11/24   Ruthell Lauraine FALCON, NP  lidocaine  (LIDODERM ) 5 % Place 1 patch onto the skin daily. Remove & Discard patch within 12 hours or as directed by MD 02/15/24   Griselda Norris, MD  lidocaine -prilocaine  (EMLA ) cream Apply to affected area once 06/18/24   Sherrod Sherrod, MD  loratadine  (CLARITIN ) 10 MG tablet Take 10 mg by mouth daily.    [provider]  Nebulizers (COMPRESSOR/NEBULIZER) MISC 1 Units by  Does not apply route daily as needed. 04/01/24   Pearlean Manus, MD  omeprazole  (PRILOSEC) 20 MG capsule Take 1 capsule (20 mg total) by mouth 2 (two) times daily. (Needs to be seen before next refill) 11/15/20   Joyce, Britney F, FNP  ondansetron  (ZOFRAN ) 8 MG tablet Take 1 tablet (8 mg total) by mouth every 8 (eight) hours as needed for nausea or vomiting. 06/18/24   Sherrod Sherrod, MD  Pirfenidone  (ESBRIET ) 267 MG TABS Take 3 tablets (801 mg total) by mouth 3 (three) times daily with meals. (Account: [email or web address]) 11/14/23   Geronimo Amel, MD  Polyethyl Glycol-Propyl Glycol 0.4-0.3 % SOLN Place 1 drop into both eyes in the morning and at bedtime.    [provider]  predniSONE  (DELTASONE ) 10 MG tablet Take 1 tablet (10 mg total) by mouth See admin instructions. Take prednisone  60mg  daily x 2 weeks and then  40 mg daily x 2 weeks then prednisone  30mg  daily x 2 weeks, then 20mg  daily x 2 weeks then 10mg  daily x 2 weeks, then 5mg  daily x 2 weeeks and then 5mg  On MWF x 2 weeks and stop 04/01/24   Pearlean Manus, MD  Probiotic Product (PROBIOTIC DAILY PO) Take 1 capsule by mouth daily.    [provider]  prochlorperazine  (COMPAZINE ) 10 MG tablet Take 1 tablet (10 mg total) by mouth every 6 (six) hours as needed for nausea or vomiting. 06/18/24   Sherrod Sherrod, MD  rosuvastatin  (CRESTOR ) 10 MG tablet Take 10 mg by mouth at bedtime. 12/05/21   [provider]  saline (AYR) GEL Place 1 Application into both nostrils in the morning and at bedtime.    [provider]  traZODone  (DESYREL ) 100 MG tablet Take 1 tablet (100 mg total) by mouth at bedtime. For Sleep 04/01/24   Pearlean Manus, MD     Allergies:     Allergies  Allergen Reactions   Penicillins Shortness Of Breath   Alpha-Gal Other (See  Comments)    Abdominal Pain All four legged mammals; pork, beef, lamb, deer, etc.     Physical Exam:   Vitals  Blood pressure (!) 142/73, pulse 84, temperature 98.7 F (37.1 C), resp. rate (!) 21, SpO2 99%.  1.  General: Appears in no acute distress  2. Psychiatric: Alert, oriented x 3, intact insight and judgment  3. Neurologic: Cranial nerves II through XII grossly intact, no focal deficit noted  4. HEENMT:  Atraumatic normocephalic, extraocular's are intact  5. Respiratory : Bibasilar crackles auscultated  6. Cardiovascular : S1-S2, regular, no murmur auscultated  7. Gastrointestinal:  Abdomen is soft, nontender, no organomegaly  Everything is normal   Data Review:    CBC Recent Labs  Lab 07/02/24 1428 07/02/24 1616  WBC 8.8 9.2  HGB 9.1* 9.1*  HCT 28.2* 29.4*  PLT 361 372  MCV 90.7 94.5  MCH 29.3 29.3  MCHC 32.3 31.0  RDW 12.7 12.7  LYMPHSABS 1.0 1.1  MONOABS 0.6 0.8  EOSABS 0.5 0.5  BASOSABS 0.0 0.1   ------------------------------------------------------------------------------------------------------------------  Results for orders placed or performed during the hospital encounter of 07/02/24 (from the past 48 hours)  Basic metabolic panel     Status: None   Collection Time: 07/02/24  4:16 PM  Result Value Ref Range   Sodium 137 135 - 145 mmol/L   Potassium 4.4 3.5 - 5.1 mmol/L   Chloride 100 98 - 111 mmol/L   CO2 27 22 - 32 mmol/L   Glucose, Bld 95  70 - 99 mg/dL    Comment: Glucose reference range applies only to samples taken after fasting for at least 8 hours.   BUN 10 8 - 23 mg/dL   Creatinine, Ser 9.43 0.44 - 1.00 mg/dL   Calcium  9.6 8.9 - 10.3 mg/dL   GFR, Estimated >39 >39 mL/min    Comment: (NOTE) Calculated using the CKD-EPI Creatinine Equation (2021)    Anion gap 11 5 - 15    Comment: Performed at [facility], [address] [place], [place] [address]  CBC with Differential     Status: Abnormal    Collection Time: 07/02/24  4:16 PM  Result Value Ref Range   WBC 9.2 4.0 - 10.5 K/uL   RBC 3.11 (L) 3.87 - 5.11 MIL/uL   Hemoglobin 9.1 (L) 12.0 - 15.0 g/dL   HCT 70.5 (L) 63.9 - 53.9 %   MCV 94.5 80.0 - 100.0 fL   MCH 29.3 26.0 - 34.0 pg   MCHC 31.0 30.0 - 36.0 g/dL   RDW 87.2 88.4 - 84.4 %   Platelets 372 150 - 400 K/uL   nRBC 0.0 0.0 - 0.2 %   Neutrophils Relative % 74 %   Neutro Abs 6.8 1.7 - 7.7 K/uL   Lymphocytes Relative 12 %   Lymphs Abs 1.1 0.7 - 4.0 K/uL   Monocytes Relative 8 %   Monocytes Absolute 0.8 0.1 - 1.0 K/uL   Eosinophils Relative 5 %   Eosinophils Absolute 0.5 0.0 - 0.5 K/uL   Basophils Relative 1 %   Basophils Absolute 0.1 0.0 - 0.1 K/uL   Immature Granulocytes 0 %   Abs Immature Granulocytes 0.04 0.00 - 0.07 K/uL    Comment: Performed at [facility], [address], [place], [place] [address]  Pro Brain natriuretic peptide     Status: None   Collection Time: 07/02/24  4:16 PM  Result Value Ref Range   Pro Brain Natriuretic Peptide 236.0 <300.0 pg/mL    Comment: (NOTE) Age Group        Cut-Points    Interpretation  < 50 years     450 pg/mL       NT-proBNP > 450 pg/mL indicates                                ADHF is likely              50 to 75 years  900 pg/mL      NT-proBNP > 900 pg/mL indicates          ADHF is likely  > 75 years      1800 pg/mL     NT-proBNP > 1800 pg/mL indicates          ADHF is likely                           All ages    Results between       Indeterminate. Further clinical             300 and the cut-   information is needed to determine            point for age group   if ADHF is present.  Elecsys proBNP II/ Elecsys proBNP II STAT           Cut-Point                       Interpretation  300 pg/mL                    NT-proBNP <300pg/mL indicates                             ADHF is not likely  Performed at [facility],  [address] [place], [place] [address]   Resp panel by RT-PCR (RSV, Flu A&B, Covid) Anterior Nasal Swab     Status: None   Collection Time: 07/02/24  4:17 PM   Specimen: Anterior Nasal Swab  Result Value Ref Range   SARS Coronavirus 2 by RT PCR NEGATIVE NEGATIVE    Comment: (NOTE) SARS-CoV-2 target nucleic acids are NOT DETECTED.  The SARS-CoV-2 RNA is generally detectable in upper respiratory specimens during the acute phase of infection. The lowest concentration of SARS-CoV-2 viral copies this assay can detect is 138 copies/mL. A negative result does not preclude SARS-Cov-2 infection and should not be used as the sole basis for treatment or other patient management decisions. A negative result may occur with  improper specimen collection/handling, submission of specimen other than nasopharyngeal swab, presence of viral mutation(s) within the areas targeted by this assay, and inadequate number of viral copies(<138 copies/mL). A negative result must be combined with clinical observations, patient history, and epidemiological information. The expected result is Negative.  Fact Sheet for Patients:  BloggerCourse.com  Fact Sheet for Healthcare Providers:  SeriousBroker.it  This test is no t yet approved or cleared by the [place]  FDA and  has been authorized for detection and/or diagnosis of SARS-CoV-2 by FDA under an Emergency Use Authorization (EUA). This EUA will remain  in effect (meaning this test can be used) for the duration of the COVID-19 declaration under Section 564(b)(1) of the Act, 21 U.S.C.section 360bbb-3(b)(1), unless the authorization is terminated  or revoked sooner.       Influenza A by PCR NEGATIVE NEGATIVE   Influenza B by PCR NEGATIVE NEGATIVE    Comment: (NOTE) The Xpert Xpress SARS-CoV-2/FLU/RSV plus assay is intended as an aid in the diagnosis of influenza from Nasopharyngeal swab specimens  and should not be used as a sole basis for treatment. Nasal washings and aspirates are unacceptable for Xpert Xpress SARS-CoV-2/FLU/RSV testing.  Fact Sheet for Patients: BloggerCourse.com  Fact Sheet for Healthcare Providers: SeriousBroker.it  This test is not yet approved or cleared by the [place]  FDA and has been authorized for detection and/or diagnosis of SARS-CoV-2 by FDA under an Emergency Use Authorization (EUA). This EUA will remain in effect (meaning this test can be used) for the duration of the COVID-19 declaration under Section 564(b)(1) of the Act, 21 U.S.C. section 360bbb-3(b)(1), unless the authorization is terminated or revoked.     Resp Syncytial Virus by PCR NEGATIVE NEGATIVE    Comment: (NOTE) Fact Sheet for Patients: BloggerCourse.com  Fact Sheet for Healthcare Providers: SeriousBroker.it  This test is not yet approved or cleared by the [place]  FDA and has been authorized for detection and/or diagnosis of SARS-CoV-2 by FDA under an Emergency Use Authorization (EUA). This EUA will remain in effect (meaning this test can be used) for the duration of the COVID-19 declaration under Section 564(b)(1)  of the Act, 21 U.S.C. section 360bbb-3(b)(1), unless the authorization is terminated or revoked.  Performed at [facility], [address] [place], [place] [address]     Chemistries  Recent Labs  Lab 07/02/24 1428 07/02/24 1616  NA 136 137  K 4.6 4.4  CL 101 100  CO2 31 27  GLUCOSE 124* 95  BUN 10 10  CREATININE 0.58 0.56  CALCIUM  9.1 9.6  AST 19  --   ALT 12  --   ALKPHOS 66  --   BILITOT 0.3  --     ------------------------------------------------------------------------------------------------------------------  ------------------------------------------------------------------------------------------------------------------ GFR: Estimated Creatinine Clearance: 66.4 mL/min (by C-G formula based on SCr of 0.56 mg/dL). Liver Function Tests: Recent Labs  Lab 07/02/24 1428  AST 19  ALT 12  ALKPHOS 66  BILITOT 0.3  PROT 7.8  ALBUMIN 3.5   No results for input(s): LIPASE, AMYLASE in the last 168 hours. No results for input(s): AMMONIA in the last 168 hours. Coagulation Profile: No results for input(s): INR, PROTIME in the last 168 hours. Cardiac Enzymes: No results for input(s): CKTOTAL, CKMB, CKMBINDEX, TROPONINI in the last 168 hours. BNP (last 3 results) Recent Labs    07/02/24 1616  PROBNP 236.0   HbA1C: No results for input(s): HGBA1C in the last 72 hours. CBG: No results for input(s): GLUCAP in the last 168 hours. Lipid Profile: No results for input(s): CHOL, HDL, LDLCALC, TRIG, CHOLHDL, LDLDIRECT in the last 72 hours. Thyroid Function Tests: No results for input(s): TSH, T4TOTAL, FREET4, T3FREE, THYROIDAB in the last 72 hours. Anemia Panel: No results for input(s): VITAMINB12, FOLATE, FERRITIN, TIBC, IRON, RETICCTPCT in the last 72 hours.  --------------------------------------------------------------------------------------------------------------- Urine analysis:    Component Value Date/Time   COLORURINE YELLOW 03/27/2024 1432   APPEARANCEUR CLEAR 03/27/2024 1432   APPEARANCEUR Clear 03/10/2022 0859   LABSPEC 1.019 03/27/2024 1432   PHURINE 5.0 03/27/2024 1432   GLUCOSEU NEGATIVE 03/27/2024 1432   HGBUR NEGATIVE 03/27/2024 1432   BILIRUBINUR NEGATIVE 03/27/2024 1432   BILIRUBINUR Negative 03/10/2022 0859   KETONESUR NEGATIVE 03/27/2024 1432   PROTEINUR NEGATIVE 03/27/2024 1432   UROBILINOGEN 0.2  10/06/2012 2112   NITRITE NEGATIVE 03/27/2024 1432   LEUKOCYTESUR SMALL (A) 03/27/2024 1432      Imaging Results:    CT Angio Chest PE W and/or Wo Contrast Result Date: 07/02/2024 CLINICAL DATA:  Stage IV lung cancer. Right-sided chest pain, chills, cough and fatigue. EXAM: CT ANGIOGRAPHY CHEST WITH CONTRAST TECHNIQUE: Multidetector CT imaging of the chest was performed using the standard protocol during bolus administration of intravenous contrast. Multiplanar CT image reconstructions and MIPs were obtained to evaluate the vascular anatomy. RADIATION DOSE REDUCTION: This exam was performed according to the departmental dose-optimization program which includes automated exposure control, adjustment of the mA and/or kV according to patient size and/or use of iterative reconstruction technique. CONTRAST:  75mL OMNIPAQUE  IOHEXOL  350 MG/ML SOLN COMPARISON:  PET CT 05/14/2024, chest CTA 03/27/2024 FINDINGS: Cardiovascular: There are no filling defects within the pulmonary arteries to suggest pulmonary embolus. The segmental right upper lobe pulmonary arteries are attenuated. The heart is enlarged. There are coronary artery calcifications. Aortic atherosclerosis. No acute aortic findings. Mediastinum/Nodes: 15 mm left hilar lymph node. 20 mm subcarinal lymph node. There are multiple prominent anterior paratracheal and upper mediastinal nodes. Many of these nodes have increased in size from prior PET. Patulous esophagus. Small hiatal hernia. Lungs/Pleura: Right perihilar masslike opacity has increased from prior exam, margins are poorly defined. Consolidation extends peripherally towards  the apex with surrounding ground-glass, new. There is increasing consolidation within the perihilar right lower lobe. Increasing consolidation in the dependent right lower lobe. Advanced emphysema with ground-glass attenuation and fibrotic change. Right pleural thickening without significant effusion. The left lower lobe pulmonary  nodule has increased, 12 mm in thickness, previously 7 mm, series 12, image 95. No pneumothorax. Upper Abdomen: Cholecystectomy.  No acute upper abdominal findings. Musculoskeletal: No focal bone lesion or acute osseous finding. Review of the MIP images confirms the above findings. IMPRESSION: 1. No pulmonary embolus. 2. Right perihilar masslike opacity has increased from prior exam, margins are poorly defined. Consolidation extends peripherally towards the apex with surrounding ground-glass, new. There is increasing consolidation within the perihilar right lower lobe and dependent right lower. Findings may represent worsening neoplastic process, pneumonia, or combination there of. 3. Increasing size of left lower lobe pulmonary nodule, suspicious for progression of disease. 4. Increasing mediastinal and left hilar adenopathy, also suspicious for progression of disease. Aortic Atherosclerosis (ICD10-I70.0) and Emphysema (ICD10-J43.9). Electronically Signed   By: Andrea Gasman M.D.   On: 07/02/2024 20:06       Assessment & Plan:    Principal Problem:   Pneumonia   Community-acquired pneumonia-presented with cough, CTA chest was concerning for pneumonia.  Patient started on Rocephin  and doxycycline  in the ED.  Will continue with antibiotics.  Follow blood culture results.  Will start albuterol  nebulizer, ipratropium nebulizer every 6 hours, Mucinex  600 mg p.o. twice daily.  Neuropathic pain-continue Cymbalta , Neurontin   Non-small cell lung cancer-patient has stage IIb non-small cell lung cancer, unresectable.  Recently started on Keytruda .  Followed by Dr. Sherrod as outpatient  COPD-patient is on 8 L/min of oxygen  chronically at home.  Started on DuoNeb and Mucinex  as above.  Idiopathic pulmonary fibrosis-chronically on 8 L point of oxygen .  Continue pirfenidone     DVT Prophylaxis-   SCDs  AM Labs Ordered, also please review Full Orders  Family Communication: Admission, patients  condition and plan of care including tests being ordered have been discussed with the patient who indicate understanding and agree with the plan and Code Status.  Code Status: DNR  Admission status: Inpatient  Time spent in minutes : 60 minutes   Philomene Haff S Azarion Hove M.D

## 2024-07-02 NOTE — ED Provider Notes (Signed)
 [place] [facility]ERGENCY DEPARTMENT AT [facility] Provider Note   CSN: [id number] Arrival date & time: 07/02/24  1524     Patient presents with: Chest Pain   Diane [name] is a 68 y.o. female with a history of non-small cell lung cancer, squamous cell carcinoma, right lower lobe mass, stage II cancer, on Keytruda , on 8 L nasal cannula at baseline, presenting from oncology office with concern for feeling generally unwell, coughing, right-sided pleuritic chest pain, increased oxygen  demand.  She reports that her oxygen  saturation dropped to 37% with ambulation at home.  She has been under 8 L.  She reports she has a chronic cough but also new productive cough.  She has right-sided chest pain with coughing.   She is not on any blood thinners.  She went to oncology office note and was referred to ED for further evaluation.  Patient reports that this is very similar to when she had pneumonia in the past   HPI     Prior to Admission medications   Medication Sig Start Date End Date Taking? Authorizing Provider  gabapentin  (NEURONTIN ) 300 MG capsule Take 300 mg by mouth 3 (three) times daily. 05/22/24  Yes [provider]  HYDROcodone -acetaminophen  (NORCO/VICODIN) 5-325 MG tablet Take 1 tablet by mouth every 4 (four) hours as needed. 06/12/24  Yes [provider]  acetaminophen  (TYLENOL ) 325 MG tablet Take 2 tablets (650 mg total) by mouth every 4 (four) hours as needed for mild pain (pain score 1-3) or fever (or Fever >/= 101). 04/01/24   Pearlean Manus, MD  albuterol  (PROVENTIL ) (2.5 MG/3ML) 0.083% nebulizer solution Take 3 mLs (2.5 mg total) by nebulization every 2 (two) hours as needed for wheezing or shortness of breath. 04/01/24   Pearlean Manus, MD  DULoxetine  (CYMBALTA ) 30 MG capsule Take 30 mg by mouth daily. 03/06/24   [provider]  gabapentin  (NEURONTIN ) 100 MG capsule Take 200 mg by mouth 3 (three) times daily. 03/06/24   [provider]   guaiFENesin  (MUCINEX ) 600 MG 12 hr tablet Take 1 tablet (600 mg total) by mouth 2 (two) times daily. 04/01/24 04/01/25  Pearlean Manus, MD  HYDROcodone  bit-homatropine (HYCODAN) 5-1.5 MG/5ML syrup Take 5 mLs by mouth every 6 (six) hours as needed for cough. 06/11/24   Ruthell Lauraine FALCON, NP  lidocaine  (LIDODERM ) 5 % Place 1 patch onto the skin daily. Remove & Discard patch within 12 hours or as directed by MD 02/15/24   Griselda Norris, MD  lidocaine -prilocaine  (EMLA ) cream Apply to affected area once 06/18/24   Sherrod Sherrod, MD  loratadine  (CLARITIN ) 10 MG tablet Take 10 mg by mouth daily.    [provider]  Nebulizers (COMPRESSOR/NEBULIZER) MISC 1 Units by Does not apply route daily as needed. 04/01/24   Pearlean Manus, MD  omeprazole  (PRILOSEC) 20 MG capsule Take 1 capsule (20 mg total) by mouth 2 (two) times daily. (Needs to be seen before next refill) 11/15/20   Joyce, Britney F, FNP  ondansetron  (ZOFRAN ) 8 MG tablet Take 1 tablet (8 mg total) by mouth every 8 (eight) hours as needed for nausea or vomiting. 06/18/24   Sherrod Sherrod, MD  Pirfenidone  (ESBRIET ) 267 MG TABS Take 3 tablets (801 mg total) by mouth 3 (three) times daily with meals. (Account: [email or web address]) 11/14/23   Geronimo Amel, MD  Polyethyl Glycol-Propyl Glycol 0.4-0.3 % SOLN Place 1 drop into both eyes in the morning and at bedtime.    [provider]  predniSONE  (DELTASONE ) 10 MG  tablet Take 1 tablet (10 mg total) by mouth See admin instructions. Take prednisone  60mg  daily x 2 weeks and then  40 mg daily x 2 weeks then prednisone  30mg  daily x 2 weeks, then 20mg  daily x 2 weeks then 10mg  daily x 2 weeks, then 5mg  daily x 2 weeeks and then 5mg  On MWF x 2 weeks and stop 04/01/24   Pearlean Manus, MD  Probiotic Product (PROBIOTIC DAILY PO) Take 1 capsule by mouth daily.    [provider]  prochlorperazine  (COMPAZINE ) 10 MG tablet Take 1 tablet (10 mg total) by mouth every 6 (six) hours as  needed for nausea or vomiting. 06/18/24   Sherrod Sherrod, MD  rosuvastatin  (CRESTOR ) 10 MG tablet Take 10 mg by mouth at bedtime. 12/05/21   [provider]  saline (AYR) GEL Place 1 Application into both nostrils in the morning and at bedtime.    [provider]  traZODone  (DESYREL ) 100 MG tablet Take 1 tablet (100 mg total) by mouth at bedtime. For Sleep 04/01/24   Pearlean Manus, MD    Allergies: Penicillins and Alpha-gal    Review of Systems  Updated Vital Signs BP (!) 149/63   Pulse 87   Temp 98.5 F (36.9 C) (Oral)   Resp (!) 26   Ht 5' 5 (1.651 m)   Wt 67.2 kg   SpO2 96%   BMI 24.65 kg/m   Physical Exam Constitutional:      General: She is not in acute distress. HENT:     Head: Normocephalic and atraumatic.  Eyes:     Conjunctiva/sclera: Conjunctivae normal.     Pupils: Pupils are equal, round, and reactive to light.  Cardiovascular:     Rate and Rhythm: Normal rate and regular rhythm.  Pulmonary:     Effort: Pulmonary effort is normal. No respiratory distress.     Comments: Rhonchi right middle and lower lobe 94% on 8L [place] Abdominal:     General: There is no distension.     Tenderness: There is no abdominal tenderness.  Skin:    General: Skin is warm and dry.  Neurological:     General: No focal deficit present.     Mental Status: She is alert. Mental status is at baseline.  Psychiatric:        Mood and Affect: Mood normal.        Behavior: Behavior normal.     (all labs ordered are listed, but only abnormal results are displayed) Labs Reviewed  CBC WITH DIFFERENTIAL/PLATELET - Abnormal; Notable for the following components:      Result Value   RBC 3.11 (*)    Hemoglobin 9.1 (*)    HCT 29.4 (*)    All other components within normal limits  RESP PANEL BY RT-PCR (RSV, FLU A&B, COVID)  RVPGX2  CULTURE, BLOOD (ROUTINE X 2)  CULTURE, BLOOD (ROUTINE X 2)  MRSA NEXT GEN BY PCR, NASAL  BASIC METABOLIC PANEL WITH GFR  PRO BRAIN  NATRIURETIC PEPTIDE  CBC  COMPREHENSIVE METABOLIC PANEL WITH GFR    EKG: None  Radiology: CT Angio Chest PE W and/or Wo Contrast Result Date: 07/02/2024 CLINICAL DATA:  Stage IV lung cancer. Right-sided chest pain, chills, cough and fatigue. EXAM: CT ANGIOGRAPHY CHEST WITH CONTRAST TECHNIQUE: Multidetector CT imaging of the chest was performed using the standard protocol during bolus administration of intravenous contrast. Multiplanar CT image reconstructions and MIPs were obtained to evaluate the vascular anatomy. RADIATION DOSE REDUCTION: This exam was performed  according to the departmental dose-optimization program which includes automated exposure control, adjustment of the mA and/or kV according to patient size and/or use of iterative reconstruction technique. CONTRAST:  75mL OMNIPAQUE  IOHEXOL  350 MG/ML SOLN COMPARISON:  PET CT 05/14/2024, chest CTA 03/27/2024 FINDINGS: Cardiovascular: There are no filling defects within the pulmonary arteries to suggest pulmonary embolus. The segmental right upper lobe pulmonary arteries are attenuated. The heart is enlarged. There are coronary artery calcifications. Aortic atherosclerosis. No acute aortic findings. Mediastinum/Nodes: 15 mm left hilar lymph node. 20 mm subcarinal lymph node. There are multiple prominent anterior paratracheal and upper mediastinal nodes. Many of these nodes have increased in size from prior PET. Patulous esophagus. Small hiatal hernia. Lungs/Pleura: Right perihilar masslike opacity has increased from prior exam, margins are poorly defined. Consolidation extends peripherally towards the apex with surrounding ground-glass, new. There is increasing consolidation within the perihilar right lower lobe. Increasing consolidation in the dependent right lower lobe. Advanced emphysema with ground-glass attenuation and fibrotic change. Right pleural thickening without significant effusion. The left lower lobe pulmonary nodule has increased, 12  mm in thickness, previously 7 mm, series 12, image 95. No pneumothorax. Upper Abdomen: Cholecystectomy.  No acute upper abdominal findings. Musculoskeletal: No focal bone lesion or acute osseous finding. Review of the MIP images confirms the above findings. IMPRESSION: 1. No pulmonary embolus. 2. Right perihilar masslike opacity has increased from prior exam, margins are poorly defined. Consolidation extends peripherally towards the apex with surrounding ground-glass, new. There is increasing consolidation within the perihilar right lower lobe and dependent right lower. Findings may represent worsening neoplastic process, pneumonia, or combination there of. 3. Increasing size of left lower lobe pulmonary nodule, suspicious for progression of disease. 4. Increasing mediastinal and left hilar adenopathy, also suspicious for progression of disease. Aortic Atherosclerosis (ICD10-I70.0) and Emphysema (ICD10-J43.9). Electronically Signed   By: Andrea Gasman M.D.   On: 07/02/2024 20:06     Procedures   Medications Ordered in the ED  acetaminophen  (TYLENOL ) tablet 650 mg (has no administration in time range)  rosuvastatin  (CRESTOR ) tablet 10 mg (has no administration in time range)  DULoxetine  (CYMBALTA ) DR capsule 30 mg (has no administration in time range)  prochlorperazine  (COMPAZINE ) tablet 10 mg (has no administration in time range)  traZODone  (DESYREL ) tablet 100 mg (has no administration in time range)  pantoprazole  (PROTONIX ) EC tablet 40 mg (has no administration in time range)  gabapentin  (NEURONTIN ) capsule 200 mg (has no administration in time range)  guaiFENesin  (MUCINEX ) 12 hr tablet 600 mg (has no administration in time range)  HYDROcodone  bit-homatropine (HYCODAN) 5-1.5 MG/5ML syrup 5 mL (has no administration in time range)  Pirfenidone  TABS 801 mg (has no administration in time range)  sodium chloride  flush (NS) 0.9 % injection 3 mL (has no administration in time range)  sodium  chloride flush (NS) 0.9 % injection 3 mL (has no administration in time range)  0.9 %  sodium chloride  infusion (has no administration in time range)  ondansetron  (ZOFRAN ) tablet 4 mg (has no administration in time range)    Or  ondansetron  (ZOFRAN ) injection 4 mg (has no administration in time range)  ipratropium (ATROVENT ) nebulizer solution 0.5 mg (has no administration in time range)  albuterol  (PROVENTIL ) (2.5 MG/3ML) 0.083% nebulizer solution 2.5 mg (has no administration in time range)  cefTRIAXone  (ROCEPHIN ) 1 g in sodium chloride  0.9 % 100 mL IVPB (has no administration in time range)  doxycycline  (VIBRA -TABS) tablet 100 mg (has no administration in time range)  Chlorhexidine   Gluconate Cloth 2 % PADS 6 each (has no administration in time range)  iohexol  (OMNIPAQUE ) 350 MG/ML injection 75 mL (75 mLs Intravenous Contrast Given 07/02/24 1910)  cefTRIAXone  (ROCEPHIN ) 1 g in sodium chloride  0.9 % 100 mL IVPB (0 g Intravenous Stopped 07/02/24 2300)  doxycycline  (VIBRA -TABS) tablet 100 mg (100 mg Oral Given 07/02/24 2147)    Clinical Course as of 07/02/24 2316  Wed Jul 02, 2024  2038 Patient reassessed and remained stable on her 8 L nasal cannula at rest.  I reviewed her CT scan and to me this is concerning for potential underlying pneumonia.  Neoplastic process appears to have advanced as well with enlarging mass.  But given her hypoxia with exertion, we will plan to admit on IV antibiotics.  Patient is comfortable with this plan [MT]  2104 Admitted to Dr Drusilla hospitalist [MT]    Clinical Course User Index [MT] Cottie Donnice PARAS, MD                                 Medical Decision Making Amount and/or Complexity of Data Reviewed Labs: ordered. Radiology: ordered. ECG/medicine tests: ordered.  Risk Prescription drug management. Decision regarding hospitalization.   This patient presents to the ED with concern for SOB, cough. This involves an extensive number of treatment options,  and is a complaint that carries with it a high risk of complications and morbidity.  The differential diagnosis includes pneumonia versus pleural effusion versus pulmonary embolism versus anemia versus  Co-morbidities that complicate the patient evaluation: History of malignancy at high risk of thrombus  External records from outside source obtained and reviewed including oncology office note today  I ordered and personally interpreted labs.  The pertinent results include: Stable anemia, hemoglobin 9.1.  No emergent findings on BMP.  White blood cell count normal.  COVID and flu negative  I ordered imaging studies including CT PE study I independently visualized and interpreted imaging which showed likely enlarging mass or neoplastic process, potential atypical infection pattern or right lower lobe infection I agree with the radiologist interpretation  The patient was maintained on a cardiac monitor.  I personally viewed and interpreted the cardiac monitored which showed an underlying rhythm of: Sinus rhythm  I ordered medication including antibiotics for pneumonia  I have reviewed the patients home medicines and have made adjustments as needed   Dispostion:  After consideration of the diagnostic results and the patients response to treatment, I feel that the patent would benefit from medical admission for oxygen  monitoring and antibiotic treatment for potential pneumonia.  Patient is stable at this time on baseline 8L [place]      Final diagnoses:  Pneumonia of right lower lobe due to infectious organism    ED Discharge Orders     None          Cottie Donnice PARAS, MD 07/02/24 2316

## 2024-07-03 ENCOUNTER — Ambulatory Visit: Admitting: Emergency Medicine

## 2024-07-03 DIAGNOSIS — J189 Pneumonia, unspecified organism: Secondary | ICD-10-CM | POA: Diagnosis not present

## 2024-07-03 LAB — COMPREHENSIVE METABOLIC PANEL WITH GFR
ALT: 12 U/L (ref 0–44)
AST: 22 U/L (ref 15–41)
Albumin: 3.4 g/dL — ABNORMAL LOW (ref 3.5–5.0)
Alkaline Phosphatase: 72 U/L (ref 38–126)
Anion gap: 12 (ref 5–15)
BUN: 8 mg/dL (ref 8–23)
CO2: 27 mmol/L (ref 22–32)
Calcium: 9.5 mg/dL (ref 8.9–10.3)
Chloride: 100 mmol/L (ref 98–111)
Creatinine, Ser: 0.59 mg/dL (ref 0.44–1.00)
GFR, Estimated: >60 mL/min (ref >60)
Glucose, Bld: 109 mg/dL — ABNORMAL HIGH (ref 70–99)
Potassium: 4.1 mmol/L (ref 3.5–5.1)
Sodium: 138 mmol/L (ref 135–145)
Total Bilirubin: 0.3 mg/dL (ref 0.0–1.2)
Total Protein: 7.6 g/dL (ref 6.5–8.1)

## 2024-07-03 LAB — MRSA NEXT GEN BY PCR, NASAL: MRSA by PCR Next Gen: NOT DETECTED

## 2024-07-03 LAB — CBC
HCT: 28.3 % — ABNORMAL LOW (ref 36.0–46.0)
Hemoglobin: 8.7 g/dL — ABNORMAL LOW (ref 12.0–15.0)
MCH: 28.6 pg (ref 26.0–34.0)
MCHC: 30.7 g/dL (ref 30.0–36.0)
MCV: 93.1 fL (ref 80.0–100.0)
Platelets: 380 K/uL (ref 150–400)
RBC: 3.04 MIL/uL — ABNORMAL LOW (ref 3.87–5.11)
RDW: 12.6 % (ref 11.5–15.5)
WBC: 7.4 K/uL (ref 4.0–10.5)
nRBC: 0 % (ref 0.0–0.2)

## 2024-07-03 LAB — MAGNESIUM: Magnesium: 2 mg/dL (ref 1.7–2.4)

## 2024-07-03 MED ORDER — IPRATROPIUM-ALBUTEROL 0.5-2.5 (3) MG/3ML IN SOLN
3.0000 mL | Freq: Four times a day (QID) | RESPIRATORY_TRACT | Status: DC
  Administered 2024-07-03 – 2024-07-04 (×6): 3 mL via RESPIRATORY_TRACT
  Filled 2024-07-03 (×7): qty 3

## 2024-07-03 NOTE — Plan of Care (Signed)

## 2024-07-03 NOTE — Telephone Encounter (Unsigned)
 Copied from CRM [id number]. Topic: Clinical - Order For Equipment >> Jun 20, 2024 11:53 AM Celestine FALCON wrote: Reason for CRM: Pt is calling after coming off of hospice for cancer treatments. Pt stated she needs a new order sent to [facility] for DME.   Patient stated she needs 8 L at rest, 10 L with activity. She needs a 10 L home unit with a humidifier. She stated she had 6 D tanks + 6 E tanks, and an M64 backup.   Pt's phone number is [phone] ok to leave a vm. >> Jul 03, 2024  1:29 PM Russell PARAS wrote: Pt is contacting clinic regarding the order for oxygen  supplies that she requested. She reports contacting [employer], and they have not received the order. Reviewed chart and advised the order was sent to [facility] on 9/16. Due to coming out of hospice and wanting to remain using [place], she is requesting order be sent to them as soon as possible, due to desperately needing the oxygen  supplies.  >> Jun 25, 2024  8:16 AM Russell PARAS wrote: Pt is contacting office to speak with [facility] concerning scheduling a 6-min walk test so that she can obtain oxygen  supplies. She is currently using oxygen  supplies provided by hospice; however, she is now coming out of hospice for cancer treatments. Hospice is no longer paying for care and will need their supplies back. She is beginning treatment with Keytruda  today and would like to schedule the walk test as soon as possible, reporting she is unsure how the cancer treatments will affect her and worries she will not be able to complete the walk test if she has to wait.   Requested call back  CB#  [phone], if she does not answer please call husband [name] at # [phone] to schedule testing.

## 2024-07-03 NOTE — Progress Notes (Signed)
 PROGRESS NOTE  Diane [name]  FMW:[id number] DOB: [birth year] DOA: 07/02/2024 PCP: [name], MD   Brief Narrative: Patient is a 68 year old female with history of COPD, pulmonary fibrosis, non-small cell lung cancer on Keytruda , chronic hypoxic respiratory failure on 8 L of oxygen  at home at baseline. She  was sent from oncology office for the evaluation of weakness, cough, right-sided pleuritic chest pain.  No history of fever but reported to have chills on presentation, she was hemodynamically stable, requiring her baseline oxygen .  Lab work did not show any leukocytosis.  CT chest was negative for PE but showed right perihilar masslike opacity concerning for consolidation.  Patient started on broad-spectrum antibiotics.  Currently being managed for multifocal pneumonia.  Assessment & Plan:  Principal Problem:   Pneumonia   Community acquired  pneumonia: Presented with cough, weakness, right-sided pleuritic chest pain.  Reported chills at home.  No leukocytosis on presentation. right perihilar masslike opacity concerning for consolidation.  Patient started on broad-spectrum antibiotics.  Follow-up cultures. She is afebrile this morning.  Clinically improving.  History of chronic respiratory failure/pulmonary fibrosis: Chronically on 8 L of oxygen  at home.  Currently on the same.  Continue bronchodilators as needed.  On pirfenidone  for pulmonary fibrosis  Non-small cell lung cancer: On Keytruda .  Follows with oncology.  Past smoker  Neuropathic pain: On Cymbalta , Neurontin   Normocytic anemia: No evidence of acute blood loss.  Likely anemia of chronic disease associated with malignancy  Goals of care: CODE STATUS is DNR.          DVT prophylaxis:SCDs Start: 07/02/24 2241     Code Status: Limited: Do not attempt resuscitation (DNR) -DNR-LIMITED -Do Not Intubate/DNI   Family Communication: None at the bedside  Patient status:Inpatient  Patient is from  :Home  Anticipated discharge un:Ynfz  Estimated DC date: 1 to 2 days   Consultants: None  Procedures:None  Antimicrobials:  Anti-infectives (From admission, onward)    Start     Dose/Rate Route Frequency Ordered Stop   07/03/24 1000  cefTRIAXone  (ROCEPHIN ) 1 g in sodium chloride  0.9 % 100 mL IVPB        1 g 200 mL/hr over 30 Minutes Intravenous Every 24 hours 07/02/24 2240     07/03/24 1000  doxycycline  (VIBRA -TABS) tablet 100 mg        100 mg Oral Every 12 hours 07/02/24 2240     07/02/24 2045  cefTRIAXone  (ROCEPHIN ) 1 g in sodium chloride  0.9 % 100 mL IVPB        1 g 200 mL/hr over 30 Minutes Intravenous  Once 07/02/24 2038 07/02/24 2300   07/02/24 2045  doxycycline  (VIBRA -TABS) tablet 100 mg        100 mg Oral  Once 07/02/24 2038 07/02/24 2147       Subjective: Patient seen and examined at bedside today.  She looks overall comfortable.  Lying on bed.  Complains of some weakness.  Afebrile this morning.  On 8 L of oxygen  per minute which is her baseline.  Denies worsening shortness of breath or cough  Objective: Vitals:   07/03/24 0300 07/03/24 0400 07/03/24 0500 07/03/24 0600  BP: (!) 124/51 137/61 (!) 128/51 (!) 125/53  Pulse: 82 91 88 82  Resp: (!) 23 (!) 29 (!) 25 20  Temp:  97.8 F (36.6 C)    TempSrc:  Oral    SpO2: 98% 97% 95% 98%  Weight:      Height:       No intake  or output data in the 24 hours ending 07/03/24 0741 Filed Weights   07/02/24 2225  Weight: 67.2 kg    Examination:  General exam: Overall comfortable, not in distress HEENT: PERRL Respiratory system: Crackles on the right base Cardiovascular system: S1 & S2 heard, RRR.  Gastrointestinal system: Abdomen is nondistended, soft and nontender. Central nervous system: Alert and oriented Extremities: No edema, no clubbing ,no cyanosis Skin: No rashes, no ulcers,no icterus     Data Reviewed: I have personally reviewed following labs and imaging studies  CBC: Recent Labs  Lab  07/02/24 1428 07/02/24 1616 07/03/24 0327  WBC 8.8 9.2 7.4  NEUTROABS 6.6 6.8  --   HGB 9.1* 9.1* 8.7*  HCT 28.2* 29.4* 28.3*  MCV 90.7 94.5 93.1  PLT 361 372 380   Basic Metabolic Panel: Recent Labs  Lab 07/02/24 1428 07/02/24 1616 07/03/24 0327  NA 136 137 138  K 4.6 4.4 4.1  CL 101 100 100  CO2 31 27 27   GLUCOSE 124* 95 109*  BUN 10 10 8   CREATININE 0.58 0.56 0.59  CALCIUM  9.1 9.6 9.5  MG  --   --  2.0     Recent Results (from the past 240 hours)  Resp panel by RT-PCR (RSV, Flu A&B, Covid) Anterior Nasal Swab     Status: None   Collection Time: 07/02/24  4:17 PM   Specimen: Anterior Nasal Swab  Result Value Ref Range Status   SARS Coronavirus 2 by RT PCR NEGATIVE NEGATIVE Final    Comment: (NOTE) SARS-CoV-2 target nucleic acids are NOT DETECTED.  The SARS-CoV-2 RNA is generally detectable in upper respiratory specimens during the acute phase of infection. The lowest concentration of SARS-CoV-2 viral copies this assay can detect is 138 copies/mL. A negative result does not preclude SARS-Cov-2 infection and should not be used as the sole basis for treatment or other patient management decisions. A negative result may occur with  improper specimen collection/handling, submission of specimen other than nasopharyngeal swab, presence of viral mutation(s) within the areas targeted by this assay, and inadequate number of viral copies(<138 copies/mL). A negative result must be combined with clinical observations, patient history, and epidemiological information. The expected result is Negative.  Fact Sheet for Patients:  BloggerCourse.com  Fact Sheet for Healthcare Providers:  SeriousBroker.it  This test is no t yet approved or cleared by the [place]  FDA and  has been authorized for detection and/or diagnosis of SARS-CoV-2 by FDA under an Emergency Use Authorization (EUA). This EUA will remain  in effect  (meaning this test can be used) for the duration of the COVID-19 declaration under Section 564(b)(1) of the Act, 21 U.S.C.section 360bbb-3(b)(1), unless the authorization is terminated  or revoked sooner.       Influenza A by PCR NEGATIVE NEGATIVE Final   Influenza B by PCR NEGATIVE NEGATIVE Final    Comment: (NOTE) The Xpert Xpress SARS-CoV-2/FLU/RSV plus assay is intended as an aid in the diagnosis of influenza from Nasopharyngeal swab specimens and should not be used as a sole basis for treatment. Nasal washings and aspirates are unacceptable for Xpert Xpress SARS-CoV-2/FLU/RSV testing.  Fact Sheet for Patients: BloggerCourse.com  Fact Sheet for Healthcare Providers: SeriousBroker.it  This test is not yet approved or cleared by the [place]  FDA and has been authorized for detection and/or diagnosis of SARS-CoV-2 by FDA under an Emergency Use Authorization (EUA). This EUA will remain in effect (meaning this test can be used) for the duration of the  COVID-19 declaration under Section 564(b)(1) of the Act, 21 U.S.C. section 360bbb-3(b)(1), unless the authorization is terminated or revoked.     Resp Syncytial Virus by PCR NEGATIVE NEGATIVE Final    Comment: (NOTE) Fact Sheet for Patients: BloggerCourse.com  Fact Sheet for Healthcare Providers: SeriousBroker.it  This test is not yet approved or cleared by the [place]  FDA and has been authorized for detection and/or diagnosis of SARS-CoV-2 by FDA under an Emergency Use Authorization (EUA). This EUA will remain in effect (meaning this test can be used) for the duration of the COVID-19 declaration under Section 564(b)(1) of the Act, 21 U.S.C. section 360bbb-3(b)(1), unless the authorization is terminated or revoked.  Performed at [facility], [address], [place], [place] [address]   MRSA  Next Gen by PCR, Nasal     Status: None   Collection Time: 07/02/24 10:18 PM   Specimen: Nasal Swab  Result Value Ref Range Status   MRSA by PCR Next Gen NOT DETECTED NOT DETECTED Final    Comment: (NOTE) The GeneXpert MRSA Assay (FDA approved for NASAL specimens only), is one component of a comprehensive MRSA colonization surveillance program. It is not intended to diagnose MRSA infection nor to guide or monitor treatment for MRSA infections. Test performance is not FDA approved in patients less than 36 years old. Performed at [facility], [address], [place], [place] [address]      Radiology Studies: CT Angio Chest PE W and/or Wo Contrast Result Date: 07/02/2024 CLINICAL DATA:  Stage IV lung cancer. Right-sided chest pain, chills, cough and fatigue. EXAM: CT ANGIOGRAPHY CHEST WITH CONTRAST TECHNIQUE: Multidetector CT imaging of the chest was performed using the standard protocol during bolus administration of intravenous contrast. Multiplanar CT image reconstructions and MIPs were obtained to evaluate the vascular anatomy. RADIATION DOSE REDUCTION: This exam was performed according to the departmental dose-optimization program which includes automated exposure control, adjustment of the mA and/or kV according to patient size and/or use of iterative reconstruction technique. CONTRAST:  75mL OMNIPAQUE  IOHEXOL  350 MG/ML SOLN COMPARISON:  PET CT 05/14/2024, chest CTA 03/27/2024 FINDINGS: Cardiovascular: There are no filling defects within the pulmonary arteries to suggest pulmonary embolus. The segmental right upper lobe pulmonary arteries are attenuated. The heart is enlarged. There are coronary artery calcifications. Aortic atherosclerosis. No acute aortic findings. Mediastinum/Nodes: 15 mm left hilar lymph node. 20 mm subcarinal lymph node. There are multiple prominent anterior paratracheal and upper mediastinal nodes. Many of these nodes have increased in size from prior  PET. Patulous esophagus. Small hiatal hernia. Lungs/Pleura: Right perihilar masslike opacity has increased from prior exam, margins are poorly defined. Consolidation extends peripherally towards the apex with surrounding ground-glass, new. There is increasing consolidation within the perihilar right lower lobe. Increasing consolidation in the dependent right lower lobe. Advanced emphysema with ground-glass attenuation and fibrotic change. Right pleural thickening without significant effusion. The left lower lobe pulmonary nodule has increased, 12 mm in thickness, previously 7 mm, series 12, image 95. No pneumothorax. Upper Abdomen: Cholecystectomy.  No acute upper abdominal findings. Musculoskeletal: No focal bone lesion or acute osseous finding. Review of the MIP images confirms the above findings. IMPRESSION: 1. No pulmonary embolus. 2. Right perihilar masslike opacity has increased from prior exam, margins are poorly defined. Consolidation extends peripherally towards the apex with surrounding ground-glass, new. There is increasing consolidation within the perihilar right lower lobe and dependent right lower. Findings may represent worsening neoplastic process, pneumonia, or combination there of. 3.  Increasing size of left lower lobe pulmonary nodule, suspicious for progression of disease. 4. Increasing mediastinal and left hilar adenopathy, also suspicious for progression of disease. Aortic Atherosclerosis (ICD10-I70.0) and Emphysema (ICD10-J43.9). Electronically Signed   By: Andrea Gasman M.D.   On: 07/02/2024 20:06    Scheduled Meds:  albuterol   2.5 mg Nebulization Q6H   Chlorhexidine  Gluconate Cloth  6 each Topical Daily   doxycycline   100 mg Oral Q12H   DULoxetine   30 mg Oral Daily   gabapentin   400 mg Oral TID   guaiFENesin   600 mg Oral BID   ipratropium  0.5 mg Nebulization Q6H   pantoprazole   40 mg Oral Daily   Pirfenidone   801 mg Oral TID WC   rosuvastatin   10 mg Oral QHS   sodium  chloride flush  3 mL Intravenous Q12H   traZODone   100 mg Oral QHS   Continuous Infusions:  sodium chloride      cefTRIAXone  (ROCEPHIN )  IV       LOS: 1 day   Ivonne Mustache, MD [facility] P9/18/2025, 7:41 AM

## 2024-07-03 NOTE — Plan of Care (Signed)
  Problem: Education: Goal: Knowledge of General Education information will improve Description: Including pain rating scale, medication(s)/side effects and non-pharmacologic comfort measures Outcome: Progressing   Problem: Clinical Measurements: Goal: Respiratory complications will improve Outcome: Progressing   Problem: Nutrition: Goal: Adequate nutrition will be maintained Outcome: Progressing   Problem: Pain Managment: Goal: General experience of comfort will improve and/or be controlled Outcome: Progressing   Problem: Activity: Goal: Risk for activity intolerance will decrease Outcome: Not Progressing

## 2024-07-04 ENCOUNTER — Inpatient Hospital Stay (HOSPITAL_COMMUNITY)

## 2024-07-04 ENCOUNTER — Encounter: Payer: Self-pay | Admitting: Internal Medicine

## 2024-07-04 DIAGNOSIS — J189 Pneumonia, unspecified organism: Secondary | ICD-10-CM | POA: Diagnosis not present

## 2024-07-04 MED ORDER — GUAIFENESIN ER 600 MG PO TB12
1200.0000 mg | ORAL_TABLET | Freq: Two times a day (BID) | ORAL | Status: DC
Start: 2024-07-04 — End: 2024-07-13
  Administered 2024-07-04 – 2024-07-13 (×12): 1200 mg via ORAL
  Filled 2024-07-04 (×16): qty 2

## 2024-07-04 MED ORDER — SODIUM CHLORIDE 0.9 % IV SOLN
2.0000 g | INTRAVENOUS | Status: DC
Start: 2024-07-04 — End: 2024-07-06
  Administered 2024-07-04 – 2024-07-06 (×3): 2 g via INTRAVENOUS
  Filled 2024-07-04 (×3): qty 20

## 2024-07-04 MED ORDER — POLYETHYLENE GLYCOL 3350 17 G PO PACK
17.0000 g | PACK | Freq: Every day | ORAL | Status: DC
  Administered 2024-07-04 – 2024-07-08 (×4): 17 g via ORAL
  Filled 2024-07-04 (×7): qty 1

## 2024-07-04 MED ORDER — HYDROCOD POLI-CHLORPHE POLI ER 10-8 MG/5ML PO SUER
5.0000 mL | Freq: Two times a day (BID) | ORAL | Status: DC
  Administered 2024-07-04 – 2024-07-12 (×18): 5 mL via ORAL
  Filled 2024-07-04 (×19): qty 5

## 2024-07-04 MED ORDER — IPRATROPIUM-ALBUTEROL 0.5-2.5 (3) MG/3ML IN SOLN
3.0000 mL | Freq: Three times a day (TID) | RESPIRATORY_TRACT | Status: DC
  Administered 2024-07-05 – 2024-07-06 (×4): 3 mL via RESPIRATORY_TRACT
  Filled 2024-07-04 (×4): qty 3

## 2024-07-04 NOTE — Progress Notes (Signed)
 PROGRESS NOTE  Diane [name]  FMW:[id number] DOB: 03-25-1956 DOA: 07/02/2024 PCP: [name], MD   Brief Narrative: Patient is a 68 year old female with history of COPD, pulmonary fibrosis, non-small cell lung cancer on Keytruda , chronic hypoxic respiratory failure on 8 L of oxygen  at home at baseline. She  was sent from oncology office for the evaluation of weakness, cough, right-sided pleuritic chest pain.  No history of fever but reported to have chills on presentation, she was hemodynamically stable, requiring her baseline oxygen .  Lab work did not show any leukocytosis.  CT chest was negative for PE but showed right perihilar masslike opacity concerning for consolidation.  Patient started on broad-spectrum antibiotics.  Currently being managed for multifocal pneumonia.  Clinically improving.  PT consulted today  Assessment & Plan:  Principal Problem:   Pneumonia   Community acquired  pneumonia: Presented with cough, weakness, right-sided pleuritic chest pain.  Reported chills at home.  No leukocytosis on presentation. right perihilar masslike opacity concerning for consolidation.  Patient started on broad-spectrum antibiotics.  Follow-up cultures.  No growth till date She is afebrile this morning.  Clinically improving.  Still having cough.  Desaturated and becomes short of breath on  ambulation.  Adjusted cough medications.  History of chronic respiratory failure/pulmonary fibrosis: Chronically on 8 L of oxygen  at home.  Currently on the same.  Continue bronchodilators as needed.  On pirfenidone  for pulmonary fibrosis  Non-small cell lung cancer: On Keytruda .  Follows with oncology.  Past smoker  Neuropathic pain: On Cymbalta , Neurontin   Normocytic anemia: No evidence of acute blood loss.  Likely anemia of chronic disease associated with malignancy  Goals of care: CODE STATUS is DNR.  Deconditioning/Debility: PT consulted          DVT prophylaxis:SCDs Start: 07/02/24  2241     Code Status: Limited: Do not attempt resuscitation (DNR) -DNR-LIMITED -Do Not Intubate/DNI   Family Communication: None at the bedside  Patient status:Inpatient  Patient is from :Home  Anticipated discharge un:Ynfz  Estimated DC date: 1 to 2 days   Consultants: None  Procedures:None  Antimicrobials:  Anti-infectives (From admission, onward)    Start     Dose/Rate Route Frequency Ordered Stop   07/04/24 1000  cefTRIAXone  (ROCEPHIN ) 2 g in sodium chloride  0.9 % 100 mL IVPB        2 g 200 mL/hr over 30 Minutes Intravenous Every 24 hours 07/04/24 0906     07/03/24 1000  cefTRIAXone  (ROCEPHIN ) 1 g in sodium chloride  0.9 % 100 mL IVPB  Status:  Discontinued        1 g 200 mL/hr over 30 Minutes Intravenous Every 24 hours 07/02/24 2240 07/04/24 0906   07/03/24 1000  doxycycline  (VIBRA -TABS) tablet 100 mg        100 mg Oral Every 12 hours 07/02/24 2240     07/02/24 2045  cefTRIAXone  (ROCEPHIN ) 1 g in sodium chloride  0.9 % 100 mL IVPB        1 g 200 mL/hr over 30 Minutes Intravenous  Once 07/02/24 2038 07/02/24 2300   07/02/24 2045  doxycycline  (VIBRA -TABS) tablet 100 mg        100 mg Oral  Once 07/02/24 2038 07/02/24 2147       Subjective: Patient seen and examined at bedside today.  Hemodynamically stable.  Overall comfortable.  Lying on bed.  Complains of shortness of breath while ambulating to the bathroom.  Denies any shortness of breath at rest.  On 8 L of oxygen   per minute which is her baseline.  Still having cough, not able to bring up phlegm  Objective: Vitals:   07/03/24 1951 07/03/24 2127 07/04/24 0433 07/04/24 0845  BP:  (!) 142/70 128/75   Pulse:  (!) 101 89   Resp:  18 18   Temp:  98.7 F (37.1 C) 99.1 F (37.3 C)   TempSrc:  Oral Oral   SpO2: 98% 94% 96% 95%  Weight:      Height:        Intake/Output Summary (Last 24 hours) at 07/04/2024 1116 Last data filed at 07/04/2024 0900 Gross per 24 hour  Intake 720 ml  Output --  Net 720 ml   Filed  Weights   07/02/24 2225  Weight: 67.2 kg    Examination:  General exam: Overall comfortable, not in distress HEENT: PERRL Respiratory system: Crackles on the right base Cardiovascular system: S1 & S2 heard, RRR.  Gastrointestinal system: Abdomen is nondistended, soft and nontender. Central nervous system: Alert and oriented Extremities: No edema, no clubbing ,no cyanosis Skin: No rashes, no ulcers,no icterus     Data Reviewed: I have personally reviewed following labs and imaging studies  CBC: Recent Labs  Lab 07/02/24 1428 07/02/24 1616 07/03/24 0327  WBC 8.8 9.2 7.4  NEUTROABS 6.6 6.8  --   HGB 9.1* 9.1* 8.7*  HCT 28.2* 29.4* 28.3*  MCV 90.7 94.5 93.1  PLT 361 372 380   Basic Metabolic Panel: Recent Labs  Lab 07/02/24 1428 07/02/24 1616 07/03/24 0327  NA 136 137 138  K 4.6 4.4 4.1  CL 101 100 100  CO2 31 27 27   GLUCOSE 124* 95 109*  BUN 10 10 8   CREATININE 0.58 0.56 0.59  CALCIUM  9.1 9.6 9.5  MG  --   --  2.0     Recent Results (from the past 240 hours)  Resp panel by RT-PCR (RSV, Flu A&B, Covid) Anterior Nasal Swab     Status: None   Collection Time: 07/02/24  4:17 PM   Specimen: Anterior Nasal Swab  Result Value Ref Range Status   SARS Coronavirus 2 by RT PCR NEGATIVE NEGATIVE Final    Comment: (NOTE) SARS-CoV-2 target nucleic acids are NOT DETECTED.  The SARS-CoV-2 RNA is generally detectable in upper respiratory specimens during the acute phase of infection. The lowest concentration of SARS-CoV-2 viral copies this assay can detect is 138 copies/mL. A negative result does not preclude SARS-Cov-2 infection and should not be used as the sole basis for treatment or other patient management decisions. A negative result may occur with  improper specimen collection/handling, submission of specimen other than nasopharyngeal swab, presence of viral mutation(s) within the areas targeted by this assay, and inadequate number of viral copies(<138  copies/mL). A negative result must be combined with clinical observations, patient history, and epidemiological information. The expected result is Negative.  Fact Sheet for Patients:  BloggerCourse.com  Fact Sheet for Healthcare Providers:  SeriousBroker.it  This test is no t yet approved or cleared by the [place]  FDA and  has been authorized for detection and/or diagnosis of SARS-CoV-2 by FDA under an Emergency Use Authorization (EUA). This EUA will remain  in effect (meaning this test can be used) for the duration of the COVID-19 declaration under Section 564(b)(1) of the Act, 21 U.S.C.section 360bbb-3(b)(1), unless the authorization is terminated  or revoked sooner.       Influenza A by PCR NEGATIVE NEGATIVE Final   Influenza B by PCR NEGATIVE NEGATIVE Final  Comment: (NOTE) The Xpert Xpress SARS-CoV-2/FLU/RSV plus assay is intended as an aid in the diagnosis of influenza from Nasopharyngeal swab specimens and should not be used as a sole basis for treatment. Nasal washings and aspirates are unacceptable for Xpert Xpress SARS-CoV-2/FLU/RSV testing.  Fact Sheet for Patients: BloggerCourse.com  Fact Sheet for Healthcare Providers: SeriousBroker.it  This test is not yet approved or cleared by the [place]  FDA and has been authorized for detection and/or diagnosis of SARS-CoV-2 by FDA under an Emergency Use Authorization (EUA). This EUA will remain in effect (meaning this test can be used) for the duration of the COVID-19 declaration under Section 564(b)(1) of the Act, 21 U.S.C. section 360bbb-3(b)(1), unless the authorization is terminated or revoked.     Resp Syncytial Virus by PCR NEGATIVE NEGATIVE Final    Comment: (NOTE) Fact Sheet for Patients: BloggerCourse.com  Fact Sheet for Healthcare  Providers: SeriousBroker.it  This test is not yet approved or cleared by the [place]  FDA and has been authorized for detection and/or diagnosis of SARS-CoV-2 by FDA under an Emergency Use Authorization (EUA). This EUA will remain in effect (meaning this test can be used) for the duration of the COVID-19 declaration under Section 564(b)(1) of the Act, 21 U.S.C. section 360bbb-3(b)(1), unless the authorization is terminated or revoked.  Performed at [facility], [address], [place], [place] [address]   MRSA Next Gen by PCR, Nasal     Status: None   Collection Time: 07/02/24 10:18 PM   Specimen: Nasal Swab  Result Value Ref Range Status   MRSA by PCR Next Gen NOT DETECTED NOT DETECTED Final    Comment: (NOTE) The GeneXpert MRSA Assay (FDA approved for NASAL specimens only), is one component of a comprehensive MRSA colonization surveillance program. It is not intended to diagnose MRSA infection nor to guide or monitor treatment for MRSA infections. Test performance is not FDA approved in patients less than 1 years old. Performed at [facility], [address], [place], [place] [address]   Culture, blood (Routine X 2) w Reflex to ID Panel     Status: None (Preliminary result)   Collection Time: 07/02/24 11:09 PM   Specimen: BLOOD  Result Value Ref Range Status   Specimen Description   Final    BLOOD LEFT ANTECUBITAL Performed at [facility], [address], [place], [place] [address]    Special Requests   Final    Blood Culture adequate volume BOTTLES DRAWN AEROBIC AND ANAEROBIC Performed at [facility], [address], [place], [place] [address]    Culture   Final    NO GROWTH 1 DAY Performed at [facility], [address] [place], [place] [address]    Report Status PENDING  Incomplete  Culture, blood (Routine X 2) w Reflex to ID Panel     Status: None  (Preliminary result)   Collection Time: 07/02/24 11:09 PM   Specimen: BLOOD LEFT HAND  Result Value Ref Range Status   Specimen Description   Final    BLOOD LEFT HAND Performed at [facility], [address], [place], [place] [address]    Special Requests   Final    Blood Culture adequate volume BOTTLES DRAWN AEROBIC ONLY Performed at [facility], [address], [place], [place] [address]    Culture   Final    NO GROWTH 1 DAY Performed at [facility], [address], [place], [place] [address]    Report Status PENDING  Incomplete     Radiology Studies: CT Angio Chest PE W and/or Wo Contrast Result Date: 07/02/2024 CLINICAL DATA:  Stage IV lung cancer. Right-sided chest pain, chills, cough and fatigue. EXAM: CT ANGIOGRAPHY CHEST WITH CONTRAST TECHNIQUE: Multidetector CT imaging of the chest was performed using the standard protocol during bolus administration of intravenous contrast. Multiplanar CT image reconstructions and MIPs were obtained to evaluate the vascular anatomy. RADIATION DOSE REDUCTION: This exam was performed according to the departmental dose-optimization program which includes automated exposure control, adjustment of the mA and/or kV according to patient size and/or use of iterative reconstruction technique. CONTRAST:  75mL OMNIPAQUE  IOHEXOL  350 MG/ML SOLN COMPARISON:  PET CT 05/14/2024, chest CTA 03/27/2024 FINDINGS: Cardiovascular: There are no filling defects within the pulmonary arteries to suggest pulmonary embolus. The segmental right upper lobe pulmonary arteries are attenuated. The heart is enlarged. There are coronary artery calcifications. Aortic atherosclerosis. No acute aortic findings. Mediastinum/Nodes: 15 mm left hilar lymph node. 20 mm subcarinal lymph node. There are multiple prominent anterior paratracheal and upper mediastinal nodes. Many of these nodes have increased in size from prior PET. Patulous esophagus. Small hiatal  hernia. Lungs/Pleura: Right perihilar masslike opacity has increased from prior exam, margins are poorly defined. Consolidation extends peripherally towards the apex with surrounding ground-glass, new. There is increasing consolidation within the perihilar right lower lobe. Increasing consolidation in the dependent right lower lobe. Advanced emphysema with ground-glass attenuation and fibrotic change. Right pleural thickening without significant effusion. The left lower lobe pulmonary nodule has increased, 12 mm in thickness, previously 7 mm, series 12, image 95. No pneumothorax. Upper Abdomen: Cholecystectomy.  No acute upper abdominal findings. Musculoskeletal: No focal bone lesion or acute osseous finding. Review of the MIP images confirms the above findings. IMPRESSION: 1. No pulmonary embolus. 2. Right perihilar masslike opacity has increased from prior exam, margins are poorly defined. Consolidation extends peripherally towards the apex with surrounding ground-glass, new. There is increasing consolidation within the perihilar right lower lobe and dependent right lower. Findings may represent worsening neoplastic process, pneumonia, or combination there of. 3. Increasing size of left lower lobe pulmonary nodule, suspicious for progression of disease. 4. Increasing mediastinal and left hilar adenopathy, also suspicious for progression of disease. Aortic Atherosclerosis (ICD10-I70.0) and Emphysema (ICD10-J43.9). Electronically Signed   By: Andrea Gasman M.D.   On: 07/02/2024 20:06    Scheduled Meds:  Chlorhexidine  Gluconate Cloth  6 each Topical Daily   chlorpheniramine-HYDROcodone   5 mL Oral Q12H   doxycycline   100 mg Oral Q12H   DULoxetine   30 mg Oral Daily   gabapentin   400 mg Oral TID   guaiFENesin   1,200 mg Oral BID   ipratropium-albuterol   3 mL Nebulization Q6H   pantoprazole   40 mg Oral Daily   Pirfenidone   801 mg Oral TID WC   polyethylene glycol  17 g Oral Daily   rosuvastatin   10 mg  Oral QHS   sodium chloride  flush  3 mL Intravenous Q12H   traZODone   100 mg Oral QHS   Continuous Infusions:  cefTRIAXone  (ROCEPHIN )  IV 2 g (07/04/24 0930)     LOS: 2 days   Ivonne Mustache, MD [facility] P9/19/2025, 11:16 AM

## 2024-07-04 NOTE — TOC Initial Note (Signed)
 Transition of Care [facility]) - Initial/Assessment Note    Patient Details  Name: Diane [name] MRN: [id number] Date of Birth: Nov 15, 1955  Transition of [facility]) CM/SW Contact:    Tawni CHRISTELLA Eva, LCSW Phone Number: 07/04/2024, 2:55 PM  Clinical Narrative:                  CSW received a call from [place] compassionate care they stated pt has home O2 through [employer] and that pt will no longer be under hospice and will need home O2 arranged through [facility].   CSW met with the pt and the pt's spouse to discuss home oxygen . Pt reported that she will stay with [employer] and private pay. She stated she is waiting for more information and may be returning to hospice, and does not want to switch oxygen  companies. CSW discussed the recommendation for a rollator; the pt agreed and would like to receive it from [facility]. Pt reports having a portable O2 tank for discharge, and her family will be providing transportation at discharge. IP Care Management to sign off.  Expected Discharge Plan: Home/Self Care Barriers to Discharge: Continued Medical Work up   Patient Goals and CMS Choice Patient states their goals for this hospitalization and ongoing recovery are:: retrun home CMS Medicare.gov Compare Post Acute Care list provided to:: Patient Choice offered to / list presented to : Patient      Expected Discharge Plan and Services                             Date DME Agency Contacted: 07/04/24 Time DME Agency Contacted: 1455 Representative spoke with at DME Agency: Zack            Prior Living Arrangements/Services   Lives with:: Self, Spouse Patient language and need for interpreter reviewed:: Yes Do you feel safe going back to the place where you live?: Yes      Need for Family Participation in Patient Care: No (Comment) Care giver support system in place?: No (comment)   Criminal Activity/Legal Involvement Pertinent to Current  Situation/Hospitalization: No - Comment as needed  Activities of Daily Living   ADL Screening (condition at time of admission) Independently performs ADLs?: Yes (appropriate for developmental age) Is the patient deaf or have difficulty hearing?: No Does the patient have difficulty seeing, even when wearing glasses/contacts?: No Does the patient have difficulty concentrating, remembering, or making decisions?: No  Permission Sought/Granted                  Emotional Assessment       Orientation: : Oriented to Self, Oriented to Place, Oriented to  Time, Oriented to Situation   Psych Involvement: No (comment)  Admission diagnosis:  Pneumonia [J18.9] Pneumonia of right lower lobe due to infectious organism [J18.9] Patient Active Problem List   Diagnosis Date Noted   Cough 07/02/2024   Pneumonia 07/02/2024   Squamous cell carcinoma metastatic to both lungs (HCC) 06/18/2024   COPD with acute exacerbation (HCC) 03/28/2024   Idiopathic pulmonary fibrosis (HCC) 03/27/2024   Chronic respiratory failure with hypoxia (HCC) 03/27/2024   Interstitial lung disease (HCC) 04/11/2023   Stage II squamous cell carcinoma of right lung (HCC) 01/05/2022   Goals of care, counseling/discussion 01/05/2022   Right lower lobe pulmonary nodule 11/29/2021   Mediastinal adenopathy 11/29/2021   Cavitary lesion of lung 11/29/2021   Left foot pain 06/11/2020   Corns and callosities  06/11/2020   Pruritic erythematous rash 12/03/2019   Epigastric pain 12/03/2019   History of food allergy  04/29/2019   Allergic conjunctivitis 04/29/2019   Perennial allergic rhinitis 11/22/2018   Generalized anxiety disorder 11/22/2018   Controlled substance agreement signed 11/22/2018   Gastroesophageal reflux disease 01/11/2018   Alcohol use 12/18/2016   Insomnia 12/18/2016   Mood disorder (HCC) 12/18/2016   S/P lumbar discectomy 05/12/2015   Hyperlipidemia 08/16/2014   Other convulsions 03/14/2013   PCP:   [name], MD Pharmacy:   [facility] [place], [place] - [address] [place] [address] Phone: [phone] Fax: [phone]  [facility]) - [place], [place] - [address] [facility] [address] [place] [address] [place] [address] Phone: [phone] Fax: [phone]  [facility] [place] (New Address) - [place], [place] - [address] [facility] AT Previously: Viviana Mulligan, [place] [address] [facility] [address] [place] [address] Phone: [phone] Fax: [phone]  [facility] [place], [place] - [address] [place] [address] Phone: [phone] Fax: [phone]  [facility] - [place], [place] - [address] [place] [address] Phone: [phone] Fax: [phone]     Social Drivers of Health (SDOH) Social History: SDOH Screenings   Food Insecurity: Patient Declined (07/03/2024)  Housing: Patient Declined (07/03/2024)  Transportation Needs: No Transportation Needs (07/03/2024)  Utilities: Not At Risk (07/03/2024)  Alcohol Screen: Low Risk  (04/12/2018)  Depression (PHQ2-9): Low Risk  (06/25/2024)  Social Connections: Moderately Isolated (07/03/2024)  Tobacco Use: Medium Risk (07/02/2024)   SDOH Interventions:     Readmission Risk Interventions     No data to display

## 2024-07-04 NOTE — Plan of Care (Signed)

## 2024-07-04 NOTE — Evaluation (Signed)
 Physical Therapy Evaluation Patient Details Name: Diane [name] MRN: [id number] DOB: 05-07-1956 Today's Date: 07/04/2024  History of Present Illness  Pt is 68 yo female admitted 07/02/24 with CAP.  Pt with hx of COPD, pulmonary fibrosis, and non small cel lung CA requiring 8 L of oxygen  at home.  Other hx including but not limited to anxiety, arthritis, HTN, seizures  Clinical Impression  Pt admitted with above diagnosis. At baseline, pt from home and is independent but endurance/community ambulation are limited by her O2 need.  She is on 8 L O2 at home.  Today, pt has been mobilizing on her own in the room.  She was able to ambulate 15' and 63' with therapy with steady gait but needed cues for rest breaks and O2 at 10 L and monitored in order to maintain O2 sats.  O2 sats dropped significantly on 8 L O2 with activity and coughing (see gait below). Pt currently with functional limitations due to the deficits listed below (see PT Problem List). Pt will benefit from acute skilled PT to increase their independence and safety with mobility to allow discharge.  Likely no PT needs at d/c.          If plan is discharge home, recommend the following: Assistance with cooking/housework;Help with stairs or ramp for entrance   Can travel by private vehicle        Equipment Recommendations Rollator (4 wheels)  Recommendations for Other Services       Functional Status Assessment Patient has had a recent decline in their functional status and demonstrates the ability to make significant improvements in function in a reasonable and predictable amount of time.     Precautions / Restrictions Precautions Precautions: Fall Precaution/Restrictions Comments: watch O2      Mobility  Bed Mobility Overal bed mobility: Modified Independent Bed Mobility: Supine to Sit, Sit to Supine     Supine to sit: Modified independent (Device/Increase time) Sit to supine: Modified independent (Device/Increase  time)        Transfers Overall transfer level: Needs assistance Equipment used: None Transfers: Sit to/from Stand Sit to Stand: Supervision           General transfer comment: Performed STS x 5 during session    Ambulation/Gait Ambulation/Gait assistance: Contact guard assist Gait Distance (Feet): 80 Feet (10'x2, 50', 80') Assistive device: None, IV Pole Gait Pattern/deviations: Step-through pattern Gait velocity: decreased but functional     General Gait Details: Pt occasionally using support on IV Pole but mostly no device.  Steady gait but needing cues for rest break and monitoring of O2.  Pt initially on 8 L of O2 and walked 10' to bathroom and back.  On the way back pt with coughing spell.  Upon checking sats were 50%.  Suspected this was incorrect but pt reports has been dropping into 50s with activity.  Increased to 12 L and pt up to 90% in 1 min and able to decrease back to 8L with sats 93%.  For next bout of 50' ambulation placed on 10 L to ambulate, pt tolerating well, no coughing, good PLB, sats 87% when returned to chair and up to 90% in 30 sec.  After resting 5 mins, walked 80' on 10 L , tolerated well, sats initially 90% but dropped to 85% during recovery and took 1 min to stabilize at 90%.  Able to decrease back to 8 L with further rest and sats 93%.  Stairs  Wheelchair Mobility     Tilt Bed    Modified Rankin (Stroke Patients Only)       Balance Overall balance assessment: Needs assistance Sitting-balance support: No upper extremity supported Sitting balance-Leahy Scale: Good     Standing balance support: No upper extremity supported Standing balance-Leahy Scale: Good                               Pertinent Vitals/Pain Pain Assessment Pain Assessment: No/denies pain    Home Living Family/patient expects to be discharged to:: Private residence Living Arrangements: Spouse/significant other Available Help at Discharge:  Family;Available 24 hours/day Type of Home: House Home Access: Stairs to enter Entrance Stairs-Rails: None Entrance Stairs-Number of Steps: 2 small   Home Layout: One level Home Equipment: Shower seat;Grab bars - tub/shower Additional Comments: 8L O2 at home    Prior Function Prior Level of Function : Independent/Modified Independent             Mobility Comments: Can ambulate independently - but limited to short community distances due to O2 ADLs Comments: Pt independent with adls and shares in IADLs with spouse     Extremity/Trunk Assessment   Upper Extremity Assessment Upper Extremity Assessment: Overall WFL for tasks assessed    Lower Extremity Assessment Lower Extremity Assessment: Overall WFL for tasks assessed    Cervical / Trunk Assessment Cervical / Trunk Assessment: Normal  Communication        Cognition Arousal: Alert Behavior During Therapy: WFL for tasks assessed/performed   PT - Cognitive impairments: No apparent impairments                                 Cueing       General Comments General comments (skin integrity, edema, etc.): Education on need for rest breaks    Exercises     Assessment/Plan    PT Assessment Patient needs continued PT services  PT Problem List Cardiopulmonary status limiting activity;Decreased mobility       PT Treatment Interventions DME instruction;Therapeutic exercise;Gait training;Functional mobility training;Therapeutic activities;Patient/family education    PT Goals (Current goals can be found in the Care Plan section)  Acute Rehab PT Goals Patient Stated Goal: return home PT Goal Formulation: With patient/family Time For Goal Achievement: 07/18/24 Potential to Achieve Goals: Good    Frequency Min 1X/week     Co-evaluation               AM-PAC PT 6 Clicks Mobility  Outcome Measure Help needed turning from your back to your side while in a flat bed without using bedrails?:  None Help needed moving from lying on your back to sitting on the side of a flat bed without using bedrails?: None Help needed moving to and from a bed to a chair (including a wheelchair)?: A Little Help needed standing up from a chair using your arms (e.g., wheelchair or bedside chair)?: A Little Help needed to walk in hospital room?: A Little Help needed climbing 3-5 steps with a railing? : A Little 6 Click Score: 20    End of Session Equipment Utilized During Treatment: Gait belt;Oxygen  Activity Tolerance: Treatment limited secondary to medical complications (Comment) Patient left: in chair;with call bell/phone within reach;with family/visitor present Nurse Communication: Mobility status PT Visit Diagnosis: Other abnormalities of gait and mobility (R26.89);Muscle weakness (generalized) (M62.81)    Time: 8859-8796 PT Time  Calculation (min) (ACUTE ONLY): 23 min   Charges:   PT Evaluation $PT Eval Low Complexity: 1 Low PT Treatments $Gait Training: 8-22 mins PT General Charges $$ ACUTE PT VISIT: 1 Visit         Diane [name], PT Acute Rehab [facility] [phone]   Diane [name] Diane [name] 07/04/2024, 1:59 PM

## 2024-07-05 DIAGNOSIS — J189 Pneumonia, unspecified organism: Secondary | ICD-10-CM | POA: Diagnosis not present

## 2024-07-05 MED ORDER — BISACODYL 10 MG RE SUPP
10.0000 mg | Freq: Once | RECTAL | Status: AC
  Administered 2024-07-06: 10 mg via RECTAL
  Filled 2024-07-05: qty 1

## 2024-07-05 MED ORDER — SENNOSIDES-DOCUSATE SODIUM 8.6-50 MG PO TABS
1.0000 | ORAL_TABLET | Freq: Two times a day (BID) | ORAL | Status: DC
  Administered 2024-07-05 – 2024-07-10 (×10): 1 via ORAL
  Filled 2024-07-05 (×14): qty 1

## 2024-07-05 NOTE — Progress Notes (Signed)
 PROGRESS NOTE  Diane Diane [name]  FMW:[id number] DOB: [birth year] DOA: 07/02/2024 PCP: [name], MD   Brief Narrative: Diane [name] is a 68 year old female with history of COPD, pulmonary fibrosis, non-small cell lung cancer on Keytruda , chronic hypoxic respiratory failure on 8 L of oxygen  at home at baseline. She  was sent from oncology office for the evaluation of weakness, cough, right-sided pleuritic chest pain.  No history of fever but reported to have chills on presentation, she was hemodynamically stable, requiring her baseline oxygen .  Lab work did not show any leukocytosis.  CT chest was negative for PE but showed right perihilar masslike opacity concerning for consolidation.  Diane [name] started on broad-spectrum antibiotics.  Currently being managed for multifocal pneumonia.  Clinically improving.  Does not feel ready to go home yet.   Assessment & Plan:  Principal Problem:   Pneumonia   Community acquired  pneumonia: Presented with cough, weakness, right-sided pleuritic chest pain.  Reported chills at home.  No leukocytosis on presentation. right perihilar masslike opacity concerning for consolidation.  Diane [name] started on broad-spectrum antibiotics.  Follow-up cultures.  No growth till date She is afebrile this morning.  Clinically improving.  Still having some cough.  Desaturated and becomes short of breath on  ambulation, mainly going to the bathroom.  Adjusted cough medications.  History of chronic respiratory failure/pulmonary fibrosis: Chronically on 8 L of oxygen  at home.  Currently on the same.  Continue bronchodilators as needed.  On pirfenidone  for pulmonary fibrosis.  Follows with Dr. Bubba  Non-small cell lung cancer: On Keytruda .  Follows with oncology.  Past smoker  Neuropathic pain: On Cymbalta , Neurontin   Normocytic anemia: No evidence of acute blood loss.  Likely anemia of chronic disease associated with malignancy  Goals of care: CODE STATUS is  DNR.  Deconditioning/Debility: PT consulted, no follow-up recommended  Constipation: Continue bowel regimen          DVT prophylaxis:SCDs Start: 07/02/24 2241     Code Status: Limited: Do not attempt resuscitation (DNR) -DNR-LIMITED -Do Not Intubate/DNI   Family Communication: None at the bedside  Diane [name] status:Inpatient  Diane [name] is from :Home  Anticipated discharge un:Ynfz  Estimated DC date: 1 to 2 days   Consultants: None  Procedures:None  Antimicrobials:  Anti-infectives (From admission, onward)    Start     Dose/Rate Route Frequency Ordered Stop   07/04/24 1000  cefTRIAXone  (ROCEPHIN ) 2 g in sodium chloride  0.9 % 100 mL IVPB        2 g 200 mL/hr over 30 Minutes Intravenous Every 24 hours 07/04/24 0906     07/03/24 1000  cefTRIAXone  (ROCEPHIN ) 1 g in sodium chloride  0.9 % 100 mL IVPB  Status:  Discontinued        1 g 200 mL/hr over 30 Minutes Intravenous Every 24 hours 07/02/24 2240 07/04/24 0906   07/03/24 1000  doxycycline  (VIBRA -TABS) tablet 100 mg        100 mg Oral Every 12 hours 07/02/24 2240     07/02/24 2045  cefTRIAXone  (ROCEPHIN ) 1 g in sodium chloride  0.9 % 100 mL IVPB        1 g 200 mL/hr over 30 Minutes Intravenous  Once 07/02/24 2038 07/02/24 2300   07/02/24 2045  doxycycline  (VIBRA -TABS) tablet 100 mg        100 mg Oral  Once 07/02/24 2038 07/02/24 2147       Subjective: Diane [name] seen and examined at bedside today.  Hemodynamically stable.  Overall comfortable.  Still having some  cough.  On baseline oxygen  requirement at 8 L/min.  She has some trouble going to bathroom and desaturates easily.  Clinically improving.  She thinks she might be able to go home tomorrow.  No bowel movement yet.  Objective: Vitals:   07/04/24 1355 07/04/24 2036 07/05/24 0431 07/05/24 0804  BP: (!) 112/54 128/62 134/67   Pulse: 85 91 98   Resp: 20 (!) 24 (!) 24   Temp: 98.1 F (36.7 C) 98 F (36.7 C) 98.4 F (36.9 C)   TempSrc: Oral Oral Oral   SpO2: 100%  100% (!) 89% 98%  Weight:      Height:        Intake/Output Summary (Last 24 hours) at 07/05/2024 1112 Last data filed at 07/05/2024 0940 Gross per 24 hour  Intake 960 ml  Output --  Net 960 ml   Filed Weights   07/02/24 2225  Weight: 67.2 kg    Examination:  General exam: Overall comfortable, not in distress HEENT: PERRL Respiratory system:  crackles on right base Cardiovascular system: S1 & S2 heard, RRR.  Gastrointestinal system: Abdomen is nondistended, soft and nontender. Central nervous system: Alert and oriented Extremities: No edema, no clubbing ,no cyanosis Skin: No rashes, no ulcers,no icterus     Data Reviewed: I have personally reviewed following labs and imaging studies  CBC: Recent Labs  Lab 07/02/24 1428 07/02/24 1616 07/03/24 0327  WBC 8.8 9.2 7.4  NEUTROABS 6.6 6.8  --   HGB 9.1* 9.1* 8.7*  HCT 28.2* 29.4* 28.3*  MCV 90.7 94.5 93.1  PLT 361 372 380   Basic Metabolic Panel: Recent Labs  Lab 07/02/24 1428 07/02/24 1616 07/03/24 0327  NA 136 137 138  K 4.6 4.4 4.1  CL 101 100 100  CO2 31 27 27   GLUCOSE 124* 95 109*  BUN 10 10 8   CREATININE 0.58 0.56 0.59  CALCIUM  9.1 9.6 9.5  MG  --   --  2.0     Recent Results (from the past 240 hours)  Resp panel by RT-PCR (RSV, Flu A&B, Covid) Anterior Nasal Swab     Status: None   Collection Time: 07/02/24  4:17 PM   Specimen: Anterior Nasal Swab  Result Value Ref Range Status   SARS Coronavirus 2 by RT PCR NEGATIVE NEGATIVE Final    Comment: (NOTE) SARS-CoV-2 target nucleic acids are NOT DETECTED.  The SARS-CoV-2 RNA is generally detectable in upper respiratory specimens during the acute phase of infection. The lowest concentration of SARS-CoV-2 viral copies this assay can detect is 138 copies/mL. A negative result does not preclude SARS-Cov-2 infection and should not be used as the sole basis for treatment or other Diane [name] management decisions. A negative result may occur with  improper  specimen collection/handling, submission of specimen other than nasopharyngeal swab, presence of viral mutation(s) within the areas targeted by this assay, and inadequate number of viral copies(<138 copies/mL). A negative result must be combined with clinical observations, Diane [name] history, and epidemiological information. The expected result is Negative.  Fact Sheet for Patients:  BloggerCourse.com  Fact Sheet for Healthcare Providers:  SeriousBroker.it  This test is no t yet approved or cleared by the [place]  FDA and  has been authorized for detection and/or diagnosis of SARS-CoV-2 by FDA under an Emergency Use Authorization (EUA). This EUA will remain  in effect (meaning this test can be used) for the duration of the COVID-19 declaration under Section 564(b)(1) of the Act, 21 U.S.C.section 360bbb-3(b)(1), unless the authorization  is terminated  or revoked sooner.       Influenza A by PCR NEGATIVE NEGATIVE Final   Influenza B by PCR NEGATIVE NEGATIVE Final    Comment: (NOTE) The Xpert Xpress SARS-CoV-2/FLU/RSV plus assay is intended as an aid in the diagnosis of influenza from Nasopharyngeal swab specimens and should not be used as a sole basis for treatment. Nasal washings and aspirates are unacceptable for Xpert Xpress SARS-CoV-2/FLU/RSV testing.  Fact Sheet for Patients: BloggerCourse.com  Fact Sheet for Healthcare Providers: SeriousBroker.it  This test is not yet approved or cleared by the [place]  FDA and has been authorized for detection and/or diagnosis of SARS-CoV-2 by FDA under an Emergency Use Authorization (EUA). This EUA will remain in effect (meaning this test can be used) for the duration of the COVID-19 declaration under Section 564(b)(1) of the Act, 21 U.S.C. section 360bbb-3(b)(1), unless the authorization is terminated or revoked.     Resp  Syncytial Virus by PCR NEGATIVE NEGATIVE Final    Comment: (NOTE) Fact Sheet for Patients: BloggerCourse.com  Fact Sheet for Healthcare Providers: SeriousBroker.it  This test is not yet approved or cleared by the [place]  FDA and has been authorized for detection and/or diagnosis of SARS-CoV-2 by FDA under an Emergency Use Authorization (EUA). This EUA will remain in effect (meaning this test can be used) for the duration of the COVID-19 declaration under Section 564(b)(1) of the Act, 21 U.S.C. section 360bbb-3(b)(1), unless the authorization is terminated or revoked.  Performed at [facility], [address], [place], [place] [address]   MRSA Next Gen by PCR, Nasal     Status: None   Collection Time: 07/02/24 10:18 PM   Specimen: Nasal Swab  Result Value Ref Range Status   MRSA by PCR Next Gen NOT DETECTED NOT DETECTED Final    Comment: (NOTE) The GeneXpert MRSA Assay (FDA approved for NASAL specimens only), is one component of a comprehensive MRSA colonization surveillance program. It is not intended to diagnose MRSA infection nor to guide or monitor treatment for MRSA infections. Test performance is not FDA approved in patients less than 63 years old. Performed at [facility], [address], [place], [place] [address]   Culture, blood (Routine X 2) w Reflex to ID Panel     Status: None (Preliminary result)   Collection Time: 07/02/24 11:09 PM   Specimen: BLOOD  Result Value Ref Range Status   Specimen Description   Final    BLOOD LEFT ANTECUBITAL Performed at [facility], [address], [place], [place] [address]    Special Requests   Final    Blood Culture adequate volume BOTTLES DRAWN AEROBIC AND ANAEROBIC Performed at [facility], [address] [place], [place] [address]    Culture   Final    NO GROWTH 2 DAYS Performed at [facility], [address], [place], [place] [address]    Report Status PENDING  Incomplete  Culture, blood (Routine X 2) w Reflex to ID Panel     Status: None (Preliminary result)   Collection Time: 07/02/24 11:09 PM   Specimen: BLOOD LEFT HAND  Result Value Ref Range Status   Specimen Description   Final    BLOOD LEFT HAND Performed at [facility], [address], [place], [place] [address]    Special Requests   Final    Blood Culture adequate volume BOTTLES DRAWN AEROBIC ONLY Performed at [facility], [address], [place], [place] [address]    Culture   Final    NO GROWTH 2 DAYS Performed at [facility], [address], [place], [place] [address]    Report Status PENDING  Incomplete     Radiology Studies: DG CHEST PORT 1 VIEW Result Date: 07/04/2024 EXAM: 1 VIEW XRAY OF THE CHEST 07/04/2024 01:10:00 PM COMPARISON: 03/27/2024, 07/02/2024 CLINICAL HISTORY: PNA (pneumonia) [id number]. F/u exam for PNA; Hx of lung cancer, COPD, emphysema, pulmonary fibrosis, former smoker. FINDINGS: LUNGS AND PLEURA: Right lung airspace opacities, grossly stable. Stable chronic lung disease at the left lung base. Background pulmonary fibrosis. Small right pleural effusion. HEART AND MEDIASTINUM: Unchanged heart size and mediastinal contours. BONES AND SOFT TISSUES: No acute osseous abnormality. IMPRESSION: 1. Right lung opacities typical of pneumonia. Direct comparison with recent CT is difficult due to differences in modality, but grossly stable. 2. Small right pleural effusion. 3. Chronic lung disease. Electronically signed by: Andrea Gasman MD 07/04/2024 03:10 PM EDT RP Workstation: [id number]    Scheduled Meds:  bisacodyl   10 mg Rectal Once   Chlorhexidine  Gluconate Cloth  6 each Topical Daily   chlorpheniramine-HYDROcodone   5 mL Oral Q12H   doxycycline   100 mg Oral Q12H   DULoxetine   30 mg Oral Daily   gabapentin   400 mg Oral TID   guaiFENesin   1,200 mg Oral  BID   ipratropium-albuterol   3 mL Nebulization TID   pantoprazole   40 mg Oral Daily   Pirfenidone   801 mg Oral TID WC   polyethylene glycol  17 g Oral Daily   rosuvastatin   10 mg Oral QHS   senna-docusate  1 tablet Oral BID   sodium chloride  flush  3 mL Intravenous Q12H   traZODone   100 mg Oral QHS   Continuous Infusions:  cefTRIAXone  (ROCEPHIN )  IV 2 g (07/05/24 0947)     LOS: 3 days   Ivonne Mustache, MD [facility] P9/20/2025, 11:12 AM

## 2024-07-06 DIAGNOSIS — R918 Other nonspecific abnormal finding of lung field: Secondary | ICD-10-CM

## 2024-07-06 DIAGNOSIS — J9621 Acute and chronic respiratory failure with hypoxia: Secondary | ICD-10-CM | POA: Diagnosis not present

## 2024-07-06 DIAGNOSIS — J189 Pneumonia, unspecified organism: Secondary | ICD-10-CM | POA: Diagnosis not present

## 2024-07-06 LAB — BASIC METABOLIC PANEL WITH GFR
Anion gap: 11 (ref 5–15)
BUN: 15 mg/dL (ref 8–23)
CO2: 27 mmol/L (ref 22–32)
Calcium: 9.3 mg/dL (ref 8.9–10.3)
Chloride: 97 mmol/L — ABNORMAL LOW (ref 98–111)
Creatinine, Ser: 0.64 mg/dL (ref 0.44–1.00)
GFR, Estimated: >60 mL/min (ref >60)
Glucose, Bld: 97 mg/dL (ref 70–99)
Potassium: 5 mmol/L (ref 3.5–5.1)
Sodium: 135 mmol/L (ref 135–145)

## 2024-07-06 LAB — CBC
HCT: 28.6 % — ABNORMAL LOW (ref 36.0–46.0)
Hemoglobin: 8.5 g/dL — ABNORMAL LOW (ref 12.0–15.0)
MCH: 28.1 pg (ref 26.0–34.0)
MCHC: 29.7 g/dL — ABNORMAL LOW (ref 30.0–36.0)
MCV: 94.4 fL (ref 80.0–100.0)
Platelets: 369 K/uL (ref 150–400)
RBC: 3.03 MIL/uL — ABNORMAL LOW (ref 3.87–5.11)
RDW: 12.6 % (ref 11.5–15.5)
WBC: 7.8 K/uL (ref 4.0–10.5)
nRBC: 0 % (ref 0.0–0.2)

## 2024-07-06 LAB — EXPECTORATED SPUTUM ASSESSMENT W GRAM STAIN, RFLX TO RESP C

## 2024-07-06 MED ORDER — SODIUM CHLORIDE 3 % IN NEBU
4.0000 mL | INHALATION_SOLUTION | Freq: Four times a day (QID) | RESPIRATORY_TRACT | Status: AC
  Administered 2024-07-06 – 2024-07-09 (×11): 4 mL via RESPIRATORY_TRACT
  Filled 2024-07-06 (×12): qty 4

## 2024-07-06 MED ORDER — ARFORMOTEROL TARTRATE 15 MCG/2ML IN NEBU
15.0000 ug | INHALATION_SOLUTION | Freq: Two times a day (BID) | RESPIRATORY_TRACT | Status: DC
  Administered 2024-07-06 – 2024-07-10 (×9): 15 ug via RESPIRATORY_TRACT
  Filled 2024-07-06 (×10): qty 2

## 2024-07-06 MED ORDER — SODIUM CHLORIDE 0.9 % IV SOLN
2.0000 g | Freq: Three times a day (TID) | INTRAVENOUS | Status: DC
  Administered 2024-07-06 – 2024-07-09 (×9): 2 g via INTRAVENOUS
  Filled 2024-07-06 (×9): qty 12.5

## 2024-07-06 MED ORDER — ALBUTEROL SULFATE (2.5 MG/3ML) 0.083% IN NEBU
2.5000 mg | INHALATION_SOLUTION | Freq: Four times a day (QID) | RESPIRATORY_TRACT | Status: DC
  Administered 2024-07-06 – 2024-07-10 (×16): 2.5 mg via RESPIRATORY_TRACT
  Filled 2024-07-06 (×16): qty 3

## 2024-07-06 MED ORDER — BUDESONIDE 0.25 MG/2ML IN SUSP
0.2500 mg | Freq: Two times a day (BID) | RESPIRATORY_TRACT | Status: DC
  Administered 2024-07-06 – 2024-07-08 (×6): 0.25 mg via RESPIRATORY_TRACT
  Filled 2024-07-06 (×7): qty 2

## 2024-07-06 MED ORDER — HYDROCORTISONE SOD SUC (PF) 100 MG IJ SOLR
100.0000 mg | Freq: Two times a day (BID) | INTRAMUSCULAR | Status: DC
  Administered 2024-07-06 (×2): 100 mg via INTRAVENOUS
  Filled 2024-07-06 (×3): qty 2

## 2024-07-06 MED ORDER — REVEFENACIN 175 MCG/3ML IN SOLN
175.0000 ug | Freq: Every day | RESPIRATORY_TRACT | Status: DC
  Administered 2024-07-06 – 2024-07-10 (×5): 175 ug via RESPIRATORY_TRACT
  Filled 2024-07-06 (×6): qty 3

## 2024-07-06 NOTE — Progress Notes (Signed)
 PROGRESS NOTE  Diane [name]  FMW:[id number] DOB: [birth year] DOA: 07/02/2024 PCP: [name], MD   Brief Narrative: Patient is a 69 year old female with history of COPD, pulmonary fibrosis, non-small cell lung cancer on Keytruda , chronic hypoxic respiratory failure on 8 L of oxygen  at home at baseline. She  was sent from oncology office for the evaluation of weakness, cough, right-sided pleuritic chest pain.  No history of fever but reported to have chills on presentation, she was hemodynamically stable, requiring her baseline oxygen .  Lab work did not show any leukocytosis.  CT chest was negative for PE but showed right perihilar masslike opacity concerning for consolidation.  Patient started on broad-spectrum antibiotics.  Currently being managed for multifocal pneumonia.  Clinically improved but short of breath and desaturates easily while minimal ambulation.  Not ready to go home yet.  PCCM consulted today  Assessment & Plan:  Principal Problem:   Pneumonia   Community acquired  pneumonia: Presented with cough, weakness, right-sided pleuritic chest pain.  Reported chills at home.  No leukocytosis on presentation. right perihilar masslike opacity concerning for consolidation.  Patient started on broad-spectrum antibiotics.  Follow-up cultures.  No growth till date She is afebrile this morning.   Still having some cough.  Desaturated and becomes short of breath on  ambulation, mainly going to the bathroom.  Adjusted cough medications. Due to her chronic lung comorbidities, we consulted PCCM today.  Follow-up chest x-ray done on 9/19 showed right lung opacities typical of pneumonia , not worsening of pneumonia. May need to be started on steroids, will leave the decision up to pulmonology  History of chronic respiratory failure/pulmonary fibrosis: Chronically on 8 L of oxygen  at home.  Currently on the same.  Continue bronchodilators as needed.  On pirfenidone  for pulmonary fibrosis.   Follows with Dr. Bubba  Non-small cell lung cancer: On Keytruda .  Follows with oncology.  Past smoker  Neuropathic pain: On Cymbalta , Neurontin   Normocytic anemia: No evidence of acute blood loss.  Likely anemia of chronic disease associated with malignancy  Goals of care: CODE STATUS is DNR.  Deconditioning/Debility: PT consulted, no follow-up recommended  Constipation: Continue bowel regimen.  Will be given Dulcolax suppository today          DVT prophylaxis:SCDs Start: 07/02/24 2241     Code Status: Limited: Do not attempt resuscitation (DNR) -DNR-LIMITED -Do Not Intubate/DNI   Family Communication: None at the bedside  Patient status:Inpatient  Patient is from :Home  Anticipated discharge un:Ynfz  Estimated DC date: 1 to 2 days.  After improvement in the oxygenation   Consultants: PCCM  Procedures:None  Antimicrobials:  Anti-infectives (From admission, onward)    Start     Dose/Rate Route Frequency Ordered Stop   07/04/24 1000  cefTRIAXone  (ROCEPHIN ) 2 g in sodium chloride  0.9 % 100 mL IVPB        2 g 200 mL/hr over 30 Minutes Intravenous Every 24 hours 07/04/24 0906     07/03/24 1000  cefTRIAXone  (ROCEPHIN ) 1 g in sodium chloride  0.9 % 100 mL IVPB  Status:  Discontinued        1 g 200 mL/hr over 30 Minutes Intravenous Every 24 hours 07/02/24 2240 07/04/24 0906   07/03/24 1000  doxycycline  (VIBRA -TABS) tablet 100 mg        100 mg Oral Every 12 hours 07/02/24 2240     07/02/24 2045  cefTRIAXone  (ROCEPHIN ) 1 g in sodium chloride  0.9 % 100 mL IVPB  1 g 200 mL/hr over 30 Minutes Intravenous  Once 07/02/24 2038 07/02/24 2300   07/02/24 2045  doxycycline  (VIBRA -TABS) tablet 100 mg        100 mg Oral  Once 07/02/24 2038 07/02/24 2147       Subjective: Patient seen and examined at bedside today.  Still on 8 L of oxygen .  Desaturates even on 8 L while minimal ambulation.  Does not complain of shortness of breath at rest.  Still has some cough.   Auscultation does not reveal grossly abnormal breath sounds .  We discussed about consulting pulmonology today due to her chronic lung problems  Objective: Vitals:   07/05/24 2033 07/05/24 2043 07/06/24 0535 07/06/24 0749  BP:  (!) 110/52 (!) 108/57   Pulse:  98 85   Resp:  (!) 24 20   Temp:  98.4 F (36.9 C) 98.4 F (36.9 C)   TempSrc:  Oral Oral   SpO2: 99% 99% 98% 92%  Weight:      Height:        Intake/Output Summary (Last 24 hours) at 07/06/2024 1108 Last data filed at 07/06/2024 0851 Gross per 24 hour  Intake 1040 ml  Output --  Net 1040 ml   Filed Weights   07/02/24 2225  Weight: 67.2 kg    Examination:  General exam: Overall comfortable, not in distress, pleasant female HEENT: PERRL Respiratory system: Bilateral coarse breath sounds, few crackles Cardiovascular system: S1 & S2 heard, RRR.  Gastrointestinal system: Abdomen is nondistended, soft and nontender. Central nervous system: Alert and oriented Extremities: No edema, no clubbing ,no cyanosis Skin: No rashes, no ulcers,no icterus     Data Reviewed: I have personally reviewed following labs and imaging studies  CBC: Recent Labs  Lab 07/02/24 1428 07/02/24 1616 07/03/24 0327 07/06/24 0454  WBC 8.8 9.2 7.4 7.8  NEUTROABS 6.6 6.8  --   --   HGB 9.1* 9.1* 8.7* 8.5*  HCT 28.2* 29.4* 28.3* 28.6*  MCV 90.7 94.5 93.1 94.4  PLT 361 372 380 369   Basic Metabolic Panel: Recent Labs  Lab 07/02/24 1428 07/02/24 1616 07/03/24 0327 07/06/24 0454  NA 136 137 138 135  K 4.6 4.4 4.1 5.0  CL 101 100 100 97*  CO2 31 27 27 27   GLUCOSE 124* 95 109* 97  BUN 10 10 8 15   CREATININE 0.58 0.56 0.59 0.64  CALCIUM  9.1 9.6 9.5 9.3  MG  --   --  2.0  --      Recent Results (from the past 240 hours)  Resp panel by RT-PCR (RSV, Flu A&B, Covid) Anterior Nasal Swab     Status: None   Collection Time: 07/02/24  4:17 PM   Specimen: Anterior Nasal Swab  Result Value Ref Range Status   SARS Coronavirus 2 by RT PCR  NEGATIVE NEGATIVE Final    Comment: (NOTE) SARS-CoV-2 target nucleic acids are NOT DETECTED.  The SARS-CoV-2 RNA is generally detectable in upper respiratory specimens during the acute phase of infection. The lowest concentration of SARS-CoV-2 viral copies this assay can detect is 138 copies/mL. A negative result does not preclude SARS-Cov-2 infection and should not be used as the sole basis for treatment or other patient management decisions. A negative result may occur with  improper specimen collection/handling, submission of specimen other than nasopharyngeal swab, presence of viral mutation(s) within the areas targeted by this assay, and inadequate number of viral copies(<138 copies/mL). A negative result must be combined with clinical observations,  patient history, and epidemiological information. The expected result is Negative.  Fact Sheet for Patients:  BloggerCourse.com  Fact Sheet for Healthcare Providers:  SeriousBroker.it  This test is no t yet approved or cleared by the [place]  FDA and  has been authorized for detection and/or diagnosis of SARS-CoV-2 by FDA under an Emergency Use Authorization (EUA). This EUA will remain  in effect (meaning this test can be used) for the duration of the COVID-19 declaration under Section 564(b)(1) of the Act, 21 U.S.C.section 360bbb-3(b)(1), unless the authorization is terminated  or revoked sooner.       Influenza A by PCR NEGATIVE NEGATIVE Final   Influenza B by PCR NEGATIVE NEGATIVE Final    Comment: (NOTE) The Xpert Xpress SARS-CoV-2/FLU/RSV plus assay is intended as an aid in the diagnosis of influenza from Nasopharyngeal swab specimens and should not be used as a sole basis for treatment. Nasal washings and aspirates are unacceptable for Xpert Xpress SARS-CoV-2/FLU/RSV testing.  Fact Sheet for Patients: BloggerCourse.com  Fact Sheet for  Healthcare Providers: SeriousBroker.it  This test is not yet approved or cleared by the [place]  FDA and has been authorized for detection and/or diagnosis of SARS-CoV-2 by FDA under an Emergency Use Authorization (EUA). This EUA will remain in effect (meaning this test can be used) for the duration of the COVID-19 declaration under Section 564(b)(1) of the Act, 21 U.S.C. section 360bbb-3(b)(1), unless the authorization is terminated or revoked.     Resp Syncytial Virus by PCR NEGATIVE NEGATIVE Final    Comment: (NOTE) Fact Sheet for Patients: BloggerCourse.com  Fact Sheet for Healthcare Providers: SeriousBroker.it  This test is not yet approved or cleared by the [place]  FDA and has been authorized for detection and/or diagnosis of SARS-CoV-2 by FDA under an Emergency Use Authorization (EUA). This EUA will remain in effect (meaning this test can be used) for the duration of the COVID-19 declaration under Section 564(b)(1) of the Act, 21 U.S.C. section 360bbb-3(b)(1), unless the authorization is terminated or revoked.  Performed at [facility], [address] [place], [place] [address]   MRSA Next Gen by PCR, Nasal     Status: None   Collection Time: 07/02/24 10:18 PM   Specimen: Nasal Swab  Result Value Ref Range Status   MRSA by PCR Next Gen NOT DETECTED NOT DETECTED Final    Comment: (NOTE) The GeneXpert MRSA Assay (FDA approved for NASAL specimens only), is one component of a comprehensive MRSA colonization surveillance program. It is not intended to diagnose MRSA infection nor to guide or monitor treatment for MRSA infections. Test performance is not FDA approved in patients less than 42 years old. Performed at [facility], [address] [place], [place] [address]   Culture, blood (Routine X 2) w Reflex to ID Panel     Status: None  (Preliminary result)   Collection Time: 07/02/24 11:09 PM   Specimen: BLOOD  Result Value Ref Range Status   Specimen Description   Final    BLOOD LEFT ANTECUBITAL Performed at [facility], [address], [place], [place] [address]    Special Requests   Final    Blood Culture adequate volume BOTTLES DRAWN AEROBIC AND ANAEROBIC Performed at [facility], [address] [place], [place] [address]    Culture   Final    NO GROWTH 3 DAYS Performed at [facility], [address] [place], [place] [address]    Report Status  PENDING  Incomplete  Culture, blood (Routine X 2) w Reflex to ID Panel     Status: None (Preliminary result)   Collection Time: 07/02/24 11:09 PM   Specimen: BLOOD LEFT HAND  Result Value Ref Range Status   Specimen Description   Final    BLOOD LEFT HAND Performed at [facility], [address] [place], [place] [address]    Special Requests   Final    Blood Culture adequate volume BOTTLES DRAWN AEROBIC ONLY Performed at [facility], [address] [place], [place] [address]    Culture   Final    NO GROWTH 3 DAYS Performed at [facility], [address], [place], [place] [address]    Report Status PENDING  Incomplete     Radiology Studies: DG CHEST PORT 1 VIEW Result Date: 07/04/2024 EXAM: 1 VIEW XRAY OF THE CHEST 07/04/2024 01:10:00 PM COMPARISON: 03/27/2024, 07/02/2024 CLINICAL HISTORY: PNA (pneumonia) [id number]. F/u exam for PNA; Hx of lung cancer, COPD, emphysema, pulmonary fibrosis, former smoker. FINDINGS: LUNGS AND PLEURA: Right lung airspace opacities, grossly stable. Stable chronic lung disease at the left lung base. Background pulmonary fibrosis. Small right pleural effusion. HEART AND MEDIASTINUM: Unchanged heart size and mediastinal contours. BONES AND SOFT TISSUES: No acute osseous abnormality. IMPRESSION: 1. Right lung opacities typical of pneumonia. Direct comparison with  recent CT is difficult due to differences in modality, but grossly stable. 2. Small right pleural effusion. 3. Chronic lung disease. Electronically signed by: Andrea Gasman MD 07/04/2024 03:10 PM EDT RP Workstation: [id number]    Scheduled Meds:  albuterol   2.5 mg Nebulization QID   arformoterol   15 mcg Nebulization BID   budesonide  (PULMICORT ) nebulizer solution  0.25 mg Nebulization BID   Chlorhexidine  Gluconate Cloth  6 each Topical Daily   chlorpheniramine-HYDROcodone   5 mL Oral Q12H   doxycycline   100 mg Oral Q12H   DULoxetine   30 mg Oral Daily   gabapentin   400 mg Oral TID   guaiFENesin   1,200 mg Oral BID   hydrocortisone  sod succinate (SOLU-CORTEF ) inj  100 mg Intravenous Q12H   pantoprazole   40 mg Oral Daily   Pirfenidone   801 mg Oral TID WC   polyethylene glycol  17 g Oral Daily   revefenacin   175 mcg Nebulization Daily   rosuvastatin   10 mg Oral QHS   senna-docusate  1 tablet Oral BID   sodium chloride  flush  3 mL Intravenous Q12H   sodium chloride  HYPERTONIC  4 mL Nebulization QID   traZODone   100 mg Oral QHS   Continuous Infusions:  cefTRIAXone  (ROCEPHIN )  IV 2 g (07/06/24 0928)     LOS: 4 days   Ivonne Mustache, MD [facility] P9/21/2025, 11:08 AM

## 2024-07-06 NOTE — Plan of Care (Signed)

## 2024-07-06 NOTE — Consult Note (Signed)
 NAME:  Diane [name], MRN:  [id number], DOB:  [birth year], LOS: 4 ADMISSION DATE:  07/02/2024, CONSULTATION DATE:  07/06/2024 REFERRING MD:  Dr. Jillian, CHIEF COMPLAINT:  R pleuritic chest pain   History of Present Illness:  Diane [name] is a 68 y/o F with a PMH significant for NSCLC likely stage IV (squamous cell carcinoma) of RUL s/p chemoRx/radiation/immunotherapy (pembrolizumab ), CPFE, chronic hypoxic respiratory failure on 8 L O2 by [place] who presents for R sided pleuritic chest pain found to have pneumonia. Pulmonary consulted for acute on chronic hypoxic respiratory failure.  In the hospital, the patient was started on Ceftriaxone  and doxycycline . MRSA swab negative. RVP negative  Oncologic Background: - Diagnosed with unresectable stage 2B NSCLC in 3/23 with 99% PDL1 expression. Underwent chemoRx and radiation therapy. Last dose 02/27/22 - Undergoing surveillance until 03/2024 when PET/CT showed new hypermetabolic LLL SPN, mediastinal adenopathy, and RUL lung mass. Started on Pembrolizumab  monotherapy given bilateral disease.  Pertinent  Medical History  As above  Significant Hospital Events: Including procedures, antibiotic start and stop dates in addition to other pertinent events   As above  Interim History / Subjective:  Today, patient notes that she is still having productive cough with yellow-greenish sputum. She has some intermittent wheezing. She has hypoxia with ambulation and dyspnea on exertion. She feels fatigue all day long.  Objective    Blood pressure (!) 108/57, pulse 85, temperature 98.4 F (36.9 C), temperature source Oral, resp. rate 20, height 5' 5 (1.651 m), weight 67.2 kg, SpO2 92%.        Intake/Output Summary (Last 24 hours) at 07/06/2024 0932 Last data filed at 07/06/2024 9148 Gross per 24 hour  Intake 1280 ml  Output --  Net 1280 ml   Filed Weights   07/02/24 2225  Weight: 67.2 kg    Examination: General: well appearing,  tachypneic HENT: anicteric sclera, well injected conjunctivae, oral and buccal mucosa intact Lungs: inspiratory crackles from mid to lower lung fields worse on right than left Cardiovascular: tachycardic, JVD flat Abdomen: soft, non-tender Extremities: no edema Neuro: alert, oriented x 3 GU: Deferred.  Resolved problem list   Assessment and Plan   #Acute on chronic hypoxic respiratory failure: #CAP: #Concern for ICI pneumonitis: #CPFE: #Stage IV NSCLC: I reviewed chest imaging independently and patient now has dense consolidation involving right upper and lower lobe that was not there on prior imaging. The differential diagnosis for this includes community acquired pneumonia, immune checkpoint inhibitor pneumonitis given that she was started on pembrolizumab , and progression of lung cancer. MRSA swab is negative and resp culture not collected. Recommendations: - Obtain respiratory culture - Will treat with hydrocortisone  200 mg daily for both severe CAP and ICI pneumonitis. I suspect the patient will need a prolonged taper as an outpatient with repeat imaging - May consider broadening CTX to an antimicrobial with pseudomonal coverage given that patient with chronic lung disease are susceptible for such - I put in orders for aggressive pulmonary hygiene including bronchodilators, mucolytics, and chest physiotherapy - Given hx of emphysema, she would qualify for ICS/LAMA/LABA which I ordered as well - Consult palliative care medicine to discuss goals of care with patient; per my discussion with patient, she does not want intubation or even NIPPV. She notes that if she does not get better, she would be interested in pursuing hospice care which is very reasonable.  Labs   CBC: Recent Labs  Lab 07/02/24 1428 07/02/24 1616 07/03/24 0327 07/06/24 0454  WBC 8.8 9.2 7.4  7.8  NEUTROABS 6.6 6.8  --   --   HGB 9.1* 9.1* 8.7* 8.5*  HCT 28.2* 29.4* 28.3* 28.6*  MCV 90.7 94.5 93.1 94.4  PLT  361 372 380 369    Basic Metabolic Panel: Recent Labs  Lab 07/02/24 1428 07/02/24 1616 07/03/24 0327 07/06/24 0454  NA 136 137 138 135  K 4.6 4.4 4.1 5.0  CL 101 100 100 97*  CO2 31 27 27 27   GLUCOSE 124* 95 109* 97  BUN 10 10 8 15   CREATININE 0.58 0.56 0.59 0.64  CALCIUM  9.1 9.6 9.5 9.3  MG  --   --  2.0  --    GFR: Estimated Creatinine Clearance: 61.4 mL/min (by C-G formula based on SCr of 0.64 mg/dL). Recent Labs  Lab 07/02/24 1428 07/02/24 1616 07/03/24 0327 07/06/24 0454  WBC 8.8 9.2 7.4 7.8    Liver Function Tests: Recent Labs  Lab 07/02/24 1428 07/03/24 0327  AST 19 22  ALT 12 12  ALKPHOS 66 72  BILITOT 0.3 0.3  PROT 7.8 7.6  ALBUMIN 3.5 3.4*   No results for input(s): LIPASE, AMYLASE in the last 168 hours. No results for input(s): AMMONIA in the last 168 hours.  ABG    Component Value Date/Time   PHART 7.48 (H) 03/29/2023 1255   PCO2ART 37 03/29/2023 1255   PO2ART 103 03/29/2023 1255   HCO3 27.6 03/29/2023 1255   O2SAT 99.9 03/29/2023 1255     Coagulation Profile: No results for input(s): INR, PROTIME in the last 168 hours.  Cardiac Enzymes: No results for input(s): CKTOTAL, CKMB, CKMBINDEX, TROPONINI in the last 168 hours.  HbA1C: HB A1C (BAYER DCA - WAIVED)  Date/Time Value Ref Range Status  01/11/2018 08:46 AM 5.1 <7.0 % Final    Comment:                                          Diabetic Adult            <7.0                                       Healthy Adult        4.3 - 5.7                                                           (DCCT/NGSP) American Diabetes Association's Summary of Glycemic Recommendations for Adults with Diabetes: Hemoglobin A1c <7.0%. More stringent glycemic goals (A1c <6.0%) may further reduce complications at the cost of increased risk of hypoglycemia.   12/18/2016 10:03 AM 5.3 <7.0 % Final    Comment:                                          Diabetic Adult            <7.0  Healthy Adult        4.3 - 5.7                                                           (DCCT/NGSP) American Diabetes Association's Summary of Glycemic Recommendations for Adults with Diabetes: Hemoglobin A1c <7.0%. More stringent glycemic goals (A1c <6.0%) may further reduce complications at the cost of increased risk of hypoglycemia.    Hgb A1c MFr Bld  Date/Time Value Ref Range Status  03/30/2024 02:47 PM 5.4 4.8 - 5.6 % Final    Comment:    (NOTE) Diagnosis of Diabetes The following HbA1c ranges recommended by the American Diabetes Association (ADA) may be used as an aid in the diagnosis of diabetes mellitus.  Hemoglobin             Suggested A1C NGSP%              Diagnosis  <5.7                   Non Diabetic  5.7-6.4                Pre-Diabetic  >6.4                   Diabetic  <7.0                   Glycemic control for                       adults with diabetes.      CBG: No results for input(s): GLUCAP in the last 168 hours.  Review of Systems:   Not obtained  Past Medical History:  She,  has a past medical history of Anxiety, Arthritis, C. difficile diarrhea (2015), COPD (chronic obstructive pulmonary disease) (HCC), Depression, Eczema, H pylori ulcer, Headache(784.0), Hypercholesterolemia, Hypertension, Interstitial lung disease (HCC), Kidney stones, lung ca with recurrence (10/2021), Pneumonia, Seizures (HCC), and Shingles.   Surgical History:   Past Surgical History:  Procedure Laterality Date   ABDOMINAL HYSTERECTOMY     ADENOIDECTOMY     APPENDECTOMY     BRONCHIAL BIOPSY  12/20/2021   Procedure: BRONCHIAL BIOPSIES;  Surgeon: Brenna Adine CROME, DO;  Location: [facility];  Service: Pulmonary;;   BRONCHIAL BRUSHINGS  12/20/2021   Procedure: BRONCHIAL BRUSHINGS;  Surgeon: Brenna Adine CROME, DO;  Location: [facility];  Service: Pulmonary;;   BRONCHIAL NEEDLE ASPIRATION BIOPSY  12/20/2021   Procedure: BRONCHIAL NEEDLE ASPIRATION  BIOPSIES;  Surgeon: Brenna Adine CROME, DO;  Location: [facility];  Service: Pulmonary;;   CESAREAN SECTION     CHOLECYSTECTOMY     COLONOSCOPY  06/10/2012   RMR: Colonic polyps -removed as described above.    FINE NEEDLE ASPIRATION  12/20/2021   Procedure: FINE NEEDLE ASPIRATION (FNA) LINEAR;  Surgeon: Brenna Adine CROME, DO;  Location: [facility];  Service: Pulmonary;;   KNEE ARTHROSCOPY  1973   right knee,  torn cart   LUMBAR DISC SURGERY  05/12/2015   L4 L5   LUMBAR LAMINECTOMY/DECOMPRESSION MICRODISCECTOMY Left 05/12/2015   Procedure: Left L4-5 Microdiscectomy;  Surgeon: Oneil JAYSON Herald, MD;  Location: [facility];  Service: Orthopedics;  Laterality: Left;   TONSILLECTOMY     VIDEO BRONCHOSCOPY WITH ENDOBRONCHIAL ULTRASOUND Bilateral 12/20/2021  Procedure: VIDEO BRONCHOSCOPY WITH ENDOBRONCHIAL ULTRASOUND;  Surgeon: Brenna Adine CROME, DO;  Location: [facility];  Service: Pulmonary;  Laterality: Bilateral;   VIDEO BRONCHOSCOPY WITH RADIAL ENDOBRONCHIAL ULTRASOUND  12/20/2021   Procedure: RADIAL ENDOBRONCHIAL ULTRASOUND;  Surgeon: Brenna Adine CROME, DO;  Location: [facility];  Service: Pulmonary;;     Social History:   reports that she quit smoking about 2 years ago. Her smoking use included cigarettes. She started smoking about 32 years ago. She has a 15 pack-year smoking history. She has never used smokeless tobacco. She reports current alcohol use of about 3.0 standard drinks of alcohol per week. She reports that she does not use drugs.   Family History:  Her family history includes Alcohol abuse in her maternal grandfather; Arthritis in her father and mother; Asthma in her mother; COPD in her sister; Cancer in her brother and sister; Depression in her mother; Early death in her sister; Heart disease in her father; Hyperlipidemia in her father and mother; Varicose Veins in her mother; Vision loss in her father. There is no history of Colon cancer.   Allergies Allergies  Allergen Reactions    Penicillins Shortness Of Breath   Alpha-Gal Other (See Comments)    Abdominal Pain All four legged mammals; pork, beef, lamb, deer, etc.     Home Medications  Prior to Admission medications   Medication Sig Start Date End Date Taking? Authorizing Provider  acetaminophen  (TYLENOL ) 325 MG tablet Take 2 tablets (650 mg total) by mouth every 4 (four) hours as needed for mild pain (pain score 1-3) or fever (or Fever >/= 101). 04/01/24  Yes Emokpae, Courage, MD  DULoxetine  (CYMBALTA ) 30 MG capsule Take 30 mg by mouth daily. 03/06/24  Yes [provider]  gabapentin  (NEURONTIN ) 400 MG tablet Take 100 mg by mouth 3 (three) times daily. 03/06/24  Yes [provider]  HYDROcodone  bit-homatropine (HYCODAN) 5-1.5 MG/5ML syrup Take 5 mLs by mouth every 6 (six) hours as needed for cough. 06/11/24  Yes Ruthell Lauraine FALCON, NP  HYDROcodone -acetaminophen  (NORCO/VICODIN) 5-325 MG tablet Take 1 tablet by mouth every 4 (four) hours as needed for moderate pain (pain score 4-6) or severe pain (pain score 7-10). 06/12/24  Yes [provider]  loratadine  (CLARITIN ) 10 MG tablet Take 10 mg by mouth daily.   Yes [provider]  omeprazole  (PRILOSEC) 20 MG capsule Take 1 capsule (20 mg total) by mouth 2 (two) times daily. (Needs to be seen before next refill) 11/15/20  Yes Joyce, Britney F, FNP  ondansetron  (ZOFRAN ) 8 MG tablet Take 1 tablet (8 mg total) by mouth every 8 (eight) hours as needed for nausea or vomiting. 06/18/24  Yes Sherrod Sherrod, MD  Pirfenidone  (ESBRIET ) 267 MG TABS Take 3 tablets (801 mg total) by mouth 3 (three) times daily with meals. (Account: [email or web address]) 11/14/23  Yes Geronimo Amel, MD  Polyethyl Glycol-Propyl Glycol 0.4-0.3 % SOLN Place 1 drop into both eyes in the morning and at bedtime.   Yes [provider]  Probiotic Product (PROBIOTIC DAILY PO) Take 1 capsule by mouth daily.   Yes [provider]  prochlorperazine  (COMPAZINE ) 10  MG tablet Take 1 tablet (10 mg total) by mouth every 6 (six) hours as needed for nausea or vomiting. 06/18/24  Yes Sherrod Sherrod, MD  rosuvastatin  (CRESTOR ) 10 MG tablet Take 10 mg by mouth at bedtime. 12/05/21  Yes [provider]  saline (AYR) GEL Place 1 Application into both nostrils in the morning and at bedtime.  Yes [provider]  traZODone  (DESYREL ) 100 MG tablet Take 1 tablet (100 mg total) by mouth at bedtime. For Sleep 04/01/24  Yes Emokpae, Courage, MD  albuterol  (PROVENTIL ) (2.5 MG/3ML) 0.083% nebulizer solution Take 3 mLs (2.5 mg total) by nebulization every 2 (two) hours as needed for wheezing or shortness of breath. Patient not taking: Reported on 07/03/2024 04/01/24   Pearlean Manus, MD  guaiFENesin  (MUCINEX ) 600 MG 12 hr tablet Take 1 tablet (600 mg total) by mouth 2 (two) times daily. Patient not taking: Reported on 07/03/2024 04/01/24 04/01/25  Pearlean Manus, MD    Thank you for this interesting consult. I have spent 60 minutes evaluating patient, reviewing chart, and discussing plan of care with patient, family, and primary medical team. If you have any questions or concerns please reach out to me via pager ([phone]).  Paula Southerly, MD  Pulmonary and Critical Care

## 2024-07-07 DIAGNOSIS — J189 Pneumonia, unspecified organism: Secondary | ICD-10-CM | POA: Diagnosis not present

## 2024-07-07 DIAGNOSIS — J9621 Acute and chronic respiratory failure with hypoxia: Secondary | ICD-10-CM | POA: Diagnosis not present

## 2024-07-07 DIAGNOSIS — R918 Other nonspecific abnormal finding of lung field: Secondary | ICD-10-CM | POA: Diagnosis not present

## 2024-07-07 MED ORDER — METHYLPREDNISOLONE SODIUM SUCC 125 MG IJ SOLR
125.0000 mg | Freq: Every day | INTRAMUSCULAR | Status: AC
Start: 2024-07-07 — End: 2024-07-12
  Administered 2024-07-07 – 2024-07-09 (×3): 125 mg via INTRAVENOUS
  Filled 2024-07-07 (×4): qty 2

## 2024-07-07 MED ORDER — BISACODYL 10 MG RE SUPP
10.0000 mg | Freq: Once | RECTAL | Status: AC
  Administered 2024-07-07: 10 mg via RECTAL
  Filled 2024-07-07: qty 1

## 2024-07-07 NOTE — Progress Notes (Signed)
 PROGRESS NOTE  Diane [name]  FMW:[id number] DOB: [birth year] DOA: 07/02/2024 PCP: [name], MD   Brief Narrative: Patient is a 68 year old female with history of COPD, pulmonary fibrosis, non-small cell lung cancer on Keytruda , chronic hypoxic respiratory failure on 8 L of oxygen  at home at baseline. She  was sent from oncology office for the evaluation of weakness, cough, right-sided pleuritic chest pain.  No history of fever but reported to have chills on presentation, she was hemodynamically stable, requiring her baseline oxygen .  Lab work did not show any leukocytosis.  CT chest was negative for PE but showed right perihilar masslike opacity concerning for consolidation.  Patient started on broad-spectrum antibiotics.  Currently being managed for multifocal pneumonia.  She  remains  short of breath and desaturates easily while minimal ambulation.   PCCM consulted .  Started on high-dose steroids, antibiotics broadened to cefepime .  Assessment & Plan:  Principal Problem:   Pneumonia   Community acquired  pneumonia: Presented with cough, weakness, right-sided pleuritic chest pain.  Reported chills at home.  No leukocytosis on presentation. right perihilar masslike opacity concerning for consolidation.  Patient was started on broad-spectrum antibiotics.  Blood cultures remain negative. Cough persisted, she desaturated easily while ambulation but being at her baseline oxygen  requirement of 8 L/min. Due to her chronic lung comorbidities, we consulted PCCM .  Follow-up chest x-ray done on 9/19 showed right lung opacities typical of pneumonia , not worsening of pneumonia. Started on Solu-Medrol .  Antibiotics broadened to cefepime .  Respiratory culture showing few gram-negative rods, will follow-up sensitivity  History of chronic respiratory failure/pulmonary fibrosis: Chronically on 8 L of oxygen  at home.  Currently on the same.  Continue bronchodilators as needed.  On pirfenidone  for  pulmonary fibrosis.  Follows with Dr. Bubba  Non-small cell lung cancer: On Keytruda .  Follows with oncology.  Past smoker  Neuropathic pain: On Cymbalta , Neurontin   Normocytic anemia: No evidence of acute blood loss.  Likely anemia of chronic disease associated with malignancy  Goals of care: CODE STATUS is DNR.  Patient was previously under hospice services but recently stopped following with hospice because she was started on Keytruda .  She might be a candidate for hospice again.  She understands this.  Palliative care consulted.  Deconditioning/Debility: PT consulted, no follow-up recommended  Constipation: Continue bowel regimen.  Will be given Dulcolax suppository again today          DVT prophylaxis:SCDs Start: 07/02/24 2241     Code Status: Limited: Do not attempt resuscitation (DNR) -DNR-LIMITED -Do Not Intubate/DNI   Family Communication: None at the bedside  Patient status:Inpatient  Patient is from :Home  Anticipated discharge un:[facility]  Estimated DC date: 1 to 2 days.  After improvement in the oxygenation   Consultants: PCCM  Procedures:None  Antimicrobials:  Anti-infectives (From admission, onward)    Start     Dose/Rate Route Frequency Ordered Stop   07/06/24 1200  ceFEPIme  (MAXIPIME ) 2 g in sodium chloride  0.9 % 100 mL IVPB        2 g 200 mL/hr over 30 Minutes Intravenous Every 8 hours 07/06/24 1139     07/04/24 1000  cefTRIAXone  (ROCEPHIN ) 2 g in sodium chloride  0.9 % 100 mL IVPB  Status:  Discontinued        2 g 200 mL/hr over 30 Minutes Intravenous Every 24 hours 07/04/24 0906 07/06/24 1129   07/03/24 1000  cefTRIAXone  (ROCEPHIN ) 1 g in sodium chloride  0.9 % 100 mL IVPB  Status:  Discontinued        1 g 200 mL/hr over 30 Minutes Intravenous Every 24 hours 07/02/24 2240 07/04/24 0906   07/03/24 1000  doxycycline  (VIBRA -TABS) tablet 100 mg        100 mg Oral Every 12 hours 07/02/24 2240     07/02/24 2045  cefTRIAXone  (ROCEPHIN ) 1 g in sodium  chloride 0.9 % 100 mL IVPB        1 g 200 mL/hr over 30 Minutes Intravenous  Once 07/02/24 2038 07/02/24 2300   07/02/24 2045  doxycycline  (VIBRA -TABS) tablet 100 mg        100 mg Oral  Once 07/02/24 2038 07/02/24 2147       Subjective: Patient seen and examined at bedside today.  She like yesterday.  Not in any Distress while at rest but desaturates and becomes short of breath on ambulation.  Still coughing.  Afebrile.  On 8 L of oxygen  per minute.  We discussed about palliative  care consultation as well and she understands it  Objective: Vitals:   07/06/24 1545 07/06/24 2107 07/07/24 0545 07/07/24 0911  BP:  126/60 134/63   Pulse:  92 79   Resp:  16 16   Temp:  98 F (36.7 C) 98.6 F (37 C)   TempSrc:  Oral Oral   SpO2: 97% 97% 95% 90%  Weight:      Height:        Intake/Output Summary (Last 24 hours) at 07/07/2024 1042 Last data filed at 07/06/2024 1902 Gross per 24 hour  Intake 340 ml  Output --  Net 340 ml   Filed Weights   07/02/24 2225  Weight: 67.2 kg    Examination:   General exam: Overall comfortable, not in distress, pleasant female HEENT: PERRL Respiratory system: Bilateral coarse breath sounds, no wheezing or crackles Cardiovascular system: S1 & S2 heard, RRR.  Gastrointestinal system: Abdomen is nondistended, soft and nontender. Central nervous system: Alert and oriented Extremities: No edema, no clubbing ,no cyanosis Skin: No rashes, no ulcers,no icterus     Data Reviewed: I have personally reviewed following labs and imaging studies  CBC: Recent Labs  Lab 07/02/24 1428 07/02/24 1616 07/03/24 0327 07/06/24 0454  WBC 8.8 9.2 7.4 7.8  NEUTROABS 6.6 6.8  --   --   HGB 9.1* 9.1* 8.7* 8.5*  HCT 28.2* 29.4* 28.3* 28.6*  MCV 90.7 94.5 93.1 94.4  PLT 361 372 380 369   Basic Metabolic Panel: Recent Labs  Lab 07/02/24 1428 07/02/24 1616 07/03/24 0327 07/06/24 0454  NA 136 137 138 135  K 4.6 4.4 4.1 5.0  CL 101 100 100 97*  CO2 31 27 27  27   GLUCOSE 124* 95 109* 97  BUN 10 10 8 15   CREATININE 0.58 0.56 0.59 0.64  CALCIUM  9.1 9.6 9.5 9.3  MG  --   --  2.0  --      Recent Results (from the past 240 hours)  Resp panel by RT-PCR (RSV, Flu A&B, Covid) Anterior Nasal Swab     Status: None   Collection Time: 07/02/24  4:17 PM   Specimen: Anterior Nasal Swab  Result Value Ref Range Status   SARS Coronavirus 2 by RT PCR NEGATIVE NEGATIVE Final    Comment: (NOTE) SARS-CoV-2 target nucleic acids are NOT DETECTED.  The SARS-CoV-2 RNA is generally detectable in upper respiratory specimens during the acute phase of infection. The lowest concentration of SARS-CoV-2 viral copies this assay can detect is 138 copies/mL.  A negative result does not preclude SARS-Cov-2 infection and should not be used as the sole basis for treatment or other patient management decisions. A negative result may occur with  improper specimen collection/handling, submission of specimen other than nasopharyngeal swab, presence of viral mutation(s) within the areas targeted by this assay, and inadequate number of viral copies(<138 copies/mL). A negative result must be combined with clinical observations, patient history, and epidemiological information. The expected result is Negative.  Fact Sheet for Patients:  BloggerCourse.com  Fact Sheet for Healthcare Providers:  SeriousBroker.it  This test is no t yet approved or cleared by the [place]  FDA and  has been authorized for detection and/or diagnosis of SARS-CoV-2 by FDA under an Emergency Use Authorization (EUA). This EUA will remain  in effect (meaning this test can be used) for the duration of the COVID-19 declaration under Section 564(b)(1) of the Act, 21 U.S.C.section 360bbb-3(b)(1), unless the authorization is terminated  or revoked sooner.       Influenza A by PCR NEGATIVE NEGATIVE Final   Influenza B by PCR NEGATIVE NEGATIVE Final     Comment: (NOTE) The Xpert Xpress SARS-CoV-2/FLU/RSV plus assay is intended as an aid in the diagnosis of influenza from Nasopharyngeal swab specimens and should not be used as a sole basis for treatment. Nasal washings and aspirates are unacceptable for Xpert Xpress SARS-CoV-2/FLU/RSV testing.  Fact Sheet for Patients: BloggerCourse.com  Fact Sheet for Healthcare Providers: SeriousBroker.it  This test is not yet approved or cleared by the [place]  FDA and has been authorized for detection and/or diagnosis of SARS-CoV-2 by FDA under an Emergency Use Authorization (EUA). This EUA will remain in effect (meaning this test can be used) for the duration of the COVID-19 declaration under Section 564(b)(1) of the Act, 21 U.S.C. section 360bbb-3(b)(1), unless the authorization is terminated or revoked.     Resp Syncytial Virus by PCR NEGATIVE NEGATIVE Final    Comment: (NOTE) Fact Sheet for Patients: BloggerCourse.com  Fact Sheet for Healthcare Providers: SeriousBroker.it  This test is not yet approved or cleared by the [place]  FDA and has been authorized for detection and/or diagnosis of SARS-CoV-2 by FDA under an Emergency Use Authorization (EUA). This EUA will remain in effect (meaning this test can be used) for the duration of the COVID-19 declaration under Section 564(b)(1) of the Act, 21 U.S.C. section 360bbb-3(b)(1), unless the authorization is terminated or revoked.  Performed at [facility], [address], [place], [place] [address]   MRSA Next Gen by PCR, Nasal     Status: None   Collection Time: 07/02/24 10:18 PM   Specimen: Nasal Swab  Result Value Ref Range Status   MRSA by PCR Next Gen NOT DETECTED NOT DETECTED Final    Comment: (NOTE) The GeneXpert MRSA Assay (FDA approved for NASAL specimens only), is one component of a comprehensive  MRSA colonization surveillance program. It is not intended to diagnose MRSA infection nor to guide or monitor treatment for MRSA infections. Test performance is not FDA approved in patients less than 50 years old. Performed at [facility], [address] [place], [place] [address]   Culture, blood (Routine X 2) w Reflex to ID Panel     Status: None (Preliminary result)   Collection Time: 07/02/24 11:09 PM   Specimen: BLOOD  Result Value Ref Range Status   Specimen Description   Final    BLOOD LEFT ANTECUBITAL Performed at [facility], [address]  [place], [place] [address]    Special Requests   Final    Blood Culture adequate volume BOTTLES DRAWN AEROBIC AND ANAEROBIC Performed at [facility], [address], [place], [place] [address]    Culture   Final    NO GROWTH 4 DAYS Performed at [facility], [address] [place], [place] [address]    Report Status PENDING  Incomplete  Culture, blood (Routine X 2) w Reflex to ID Panel     Status: None (Preliminary result)   Collection Time: 07/02/24 11:09 PM   Specimen: BLOOD LEFT HAND  Result Value Ref Range Status   Specimen Description   Final    BLOOD LEFT HAND Performed at [facility], [address], [place], [place] [address]    Special Requests   Final    Blood Culture adequate volume BOTTLES DRAWN AEROBIC ONLY Performed at [facility], [address], [place], [place] [address]    Culture   Final    NO GROWTH 4 DAYS Performed at [facility], [address] [place], [place] [address]    Report Status PENDING  Incomplete  Expectorated Sputum Assessment w Gram Stain, Rflx to Resp Cult     Status: None   Collection Time: 07/06/24  2:18 PM   Specimen: Expectorated Sputum  Result Value Ref Range Status   Specimen Description EXPECTORATED SPUTUM  Final   Special Requests NONE  Final   Sputum evaluation   Final    THIS  SPECIMEN IS ACCEPTABLE FOR SPUTUM CULTURE Performed at [facility], [address] [place], [place] [address]    Report Status 07/06/2024 FINAL  Final  Culture, Respiratory w Gram Stain     Status: None (Preliminary result)   Collection Time: 07/06/24  2:18 PM  Result Value Ref Range Status   Specimen Description   Final    EXPECTORATED SPUTUM Performed at [facility], [address], [place], [place] [address]    Special Requests   Final    NONE Reflexed from [phone] [id number] Performed at [facility], [address], [place], [place] [address]    Gram Stain   Final    NO WBC SEEN RARE GRAM NEGATIVE RODS Performed at [facility], [address] [place], [place] [address]    Culture PENDING  Incomplete   Report Status PENDING  Incomplete     Radiology Studies: No results found.   Scheduled Meds:  albuterol   2.5 mg Nebulization QID   arformoterol   15 mcg Nebulization BID   budesonide  (PULMICORT ) nebulizer solution  0.25 mg Nebulization BID   chlorpheniramine-HYDROcodone   5 mL Oral Q12H   doxycycline   100 mg Oral Q12H   DULoxetine   30 mg Oral Daily   gabapentin   400 mg Oral TID   guaiFENesin   1,200 mg Oral BID   methylPREDNISolone  (SOLU-MEDROL ) injection  125 mg Intravenous Daily   pantoprazole   40 mg Oral Daily   Pirfenidone   801 mg Oral TID WC   polyethylene glycol  17 g Oral Daily   revefenacin   175 mcg Nebulization Daily   rosuvastatin   10 mg Oral QHS   senna-docusate  1 tablet Oral BID   sodium chloride  flush  3 mL Intravenous Q12H   sodium chloride  HYPERTONIC  4 mL Nebulization QID   traZODone   100 mg Oral QHS   Continuous Infusions:  ceFEPime  (MAXIPIME ) IV 2 g (07/07/24 0330)  LOS: 5 days   Ivonne Mustache, MD [facility] P9/22/2025, 10:42 AM

## 2024-07-07 NOTE — Progress Notes (Signed)
 NAME:  Diane [name], MRN:  [id number], DOB:  06-Jul-1956, LOS: 5 ADMISSION DATE:  07/02/2024, CONSULTATION DATE:  07/06/2024 REFERRING MD:  Dr. Jillian, CHIEF COMPLAINT:  R pleuritic chest pain   History of Present Illness:  Diane [name] is a 68 y/o F with a PMH significant for NSCLC likely stage IV (squamous cell carcinoma) of RUL s/p chemoRx/radiation/immunotherapy (pembrolizumab ), CPFE, chronic hypoxic respiratory failure on 8 L O2 by  who presents for R sided pleuritic chest pain found to have pneumonia. Pulmonary consulted for acute on chronic hypoxic respiratory failure.  In the hospital, the patient was started on Ceftriaxone  and doxycycline . MRSA swab negative. RVP negative  Oncologic Background: - Diagnosed with unresectable stage 2B NSCLC in 3/23 with 99% PDL1 expression. Underwent chemoRx and radiation therapy. Last dose 02/27/22 - Undergoing surveillance until 03/2024 when PET/CT showed new hypermetabolic LLL SPN, mediastinal adenopathy, and RUL lung mass. Started on Pembrolizumab  monotherapy given bilateral disease.  Pertinent  Medical History  As above  Significant Hospital Events: Including procedures, antibiotic start and stop dates in addition to other pertinent events   As above  Interim History / Subjective:  8 L nasal cannula at rest (baseline) with oxygen  saturations higher than likely necessary reporting of again desaturation to the 50s on nonrebreather with exertion, previous baseline was a liters with exertion  Objective    Blood pressure 134/63, pulse 79, temperature 98.6 F (37 C), temperature source Oral, resp. rate 16, height 5' 5 (1.651 m), weight 67.2 kg, SpO2 95%.        Intake/Output Summary (Last 24 hours) at 07/07/2024 0800 Last data filed at 07/06/2024 1902 Gross per 24 hour  Intake 580 ml  Output --  Net 580 ml   Filed Weights   07/02/24 2225  Weight: 67.2 kg    Examination: General: Chronically ill appearing,  tachypneic HENT: Atraumatic normocephalic Lungs: Normal work of breathing, vest therapy being performed Cardiovascular: Warm Abdomen: Nondistended Extremities: no edema Neuro: alert, oriented x 3  Resolved problem list   Assessment and Plan   #Acute on chronic hypoxic respiratory failure: Home baseline 6 liters rest, 8 L with exertion #CAP: #Concern for ICI pneumonitis: #Stage IV NSCLC: I reviewed chest imaging independently and patient now has dense consolidation involving right upper and lower lobe that was not there on prior imaging. The differential diagnosis for this includes community acquired pneumonia, immune checkpoint inhibitor pneumonitis given that Diane [name] was started on pembrolizumab , and progression of lung cancer. MRSA swab is negative and resp culture not collected.  Given progressive malignant disease on the left, I fear worsening malignancy given overall findings on chest x-ray. - Obtain respiratory culture - Solu-Medrol  125 mg IV daily (2 mg per KG) for possible checkpoint inhibitor pneumonitis days with subsequent taper pending improvement - Started on cefepime  to cover Pseudomonas given underlying significant emphysema, on doxycycline  for atypical coverage - Aggressive pulmonary hygiene including bronchodilators, mucolytics, and chest physiotherapy - ICS/LAMA/LABA  - Consult palliative care medicine to discuss goals of care with patient; per prior pulmonary discussion with patient, Diane [name] does not want intubation or even NIPPV. Diane [name] notes that if Diane [name] does not get better, Diane [name] would be interested in pursuing hospice care which is very reasonable  Labs   CBC: Recent Labs  Lab 07/02/24 1428 07/02/24 1616 07/03/24 0327 07/06/24 0454  WBC 8.8 9.2 7.4 7.8  NEUTROABS 6.6 6.8  --   --   HGB 9.1* 9.1* 8.7* 8.5*  HCT 28.2* 29.4* 28.3* 28.6*  MCV  90.7 94.5 93.1 94.4  PLT 361 372 380 369    Basic Metabolic Panel: Recent Labs  Lab 07/02/24 1428 07/02/24 1616 07/03/24 0327  07/06/24 0454  NA 136 137 138 135  K 4.6 4.4 4.1 5.0  CL 101 100 100 97*  CO2 31 27 27 27   GLUCOSE 124* 95 109* 97  BUN 10 10 8 15   CREATININE 0.58 0.56 0.59 0.64  CALCIUM  9.1 9.6 9.5 9.3  MG  --   --  2.0  --    GFR: Estimated Creatinine Clearance: 61.4 mL/min (by C-G formula based on SCr of 0.64 mg/dL). Recent Labs  Lab 07/02/24 1428 07/02/24 1616 07/03/24 0327 07/06/24 0454  WBC 8.8 9.2 7.4 7.8    Liver Function Tests: Recent Labs  Lab 07/02/24 1428 07/03/24 0327  AST 19 22  ALT 12 12  ALKPHOS 66 72  BILITOT 0.3 0.3  PROT 7.8 7.6  ALBUMIN 3.5 3.4*   No results for input(s): LIPASE, AMYLASE in the last 168 hours. No results for input(s): AMMONIA in the last 168 hours.  ABG    Component Value Date/Time   PHART 7.48 (H) 03/29/2023 1255   PCO2ART 37 03/29/2023 1255   PO2ART 103 03/29/2023 1255   HCO3 27.6 03/29/2023 1255   O2SAT 99.9 03/29/2023 1255     Coagulation Profile: No results for input(s): INR, PROTIME in the last 168 hours.  Cardiac Enzymes: No results for input(s): CKTOTAL, CKMB, CKMBINDEX, TROPONINI in the last 168 hours.  HbA1C: HB A1C (BAYER DCA - WAIVED)  Date/Time Value Ref Range Status  01/11/2018 08:46 AM 5.1 <7.0 % Final    Comment:                                          Diabetic Adult            <7.0                                       Healthy Adult        4.3 - 5.7                                                           (DCCT/NGSP) American Diabetes Association's Summary of Glycemic Recommendations for Adults with Diabetes: Hemoglobin A1c <7.0%. More stringent glycemic goals (A1c <6.0%) may further reduce complications at the cost of increased risk of hypoglycemia.   12/18/2016 10:03 AM 5.3 <7.0 % Final    Comment:                                          Diabetic Adult            <7.0                                       Healthy Adult        4.3 - 5.7                                                            (  DCCT/NGSP) American Diabetes Association's Summary of Glycemic Recommendations for Adults with Diabetes: Hemoglobin A1c <7.0%. More stringent glycemic goals (A1c <6.0%) may further reduce complications at the cost of increased risk of hypoglycemia.    Hgb A1c MFr Bld  Date/Time Value Ref Range Status  03/30/2024 02:47 PM 5.4 4.8 - 5.6 % Final    Comment:    (NOTE) Diagnosis of Diabetes The following HbA1c ranges recommended by the American Diabetes Association (ADA) may be used as an aid in the diagnosis of diabetes mellitus.  Hemoglobin             Suggested A1C NGSP%              Diagnosis  <5.7                   Non Diabetic  5.7-6.4                Pre-Diabetic  >6.4                   Diabetic  <7.0                   Glycemic control for                       adults with diabetes.      CBG: No results for input(s): GLUCAP in the last 168 hours.  Review of Systems:   Not obtained  Past Medical History:  Diane [name],  has a past medical history of Anxiety, Arthritis, C. difficile diarrhea (2015), COPD (chronic obstructive pulmonary disease) (HCC), Depression, Eczema, H pylori ulcer, Headache(784.0), Hypercholesterolemia, Hypertension, Interstitial lung disease (HCC), Kidney stones, lung ca with recurrence (10/2021), Pneumonia, Seizures (HCC), and Shingles.   Surgical History:   Past Surgical History:  Procedure Laterality Date   ABDOMINAL HYSTERECTOMY     ADENOIDECTOMY     APPENDECTOMY     BRONCHIAL BIOPSY  12/20/2021   Procedure: BRONCHIAL BIOPSIES;  Surgeon: Brenna Adine CROME, DO;  Location: [facility];  Service: Pulmonary;;   BRONCHIAL BRUSHINGS  12/20/2021   Procedure: BRONCHIAL BRUSHINGS;  Surgeon: Brenna Adine CROME, DO;  Location: [facility];  Service: Pulmonary;;   BRONCHIAL NEEDLE ASPIRATION BIOPSY  12/20/2021   Procedure: BRONCHIAL NEEDLE ASPIRATION BIOPSIES;  Surgeon: Brenna Adine CROME, DO;  Location: [facility];  Service: Pulmonary;;   CESAREAN SECTION      CHOLECYSTECTOMY     COLONOSCOPY  06/10/2012   RMR: Colonic polyps -removed as described above.    FINE NEEDLE ASPIRATION  12/20/2021   Procedure: FINE NEEDLE ASPIRATION (FNA) LINEAR;  Surgeon: Brenna Adine CROME, DO;  Location: [facility];  Service: Pulmonary;;   KNEE ARTHROSCOPY  1973   right knee,  torn cart   LUMBAR DISC SURGERY  05/12/2015   L4 L5   LUMBAR LAMINECTOMY/DECOMPRESSION MICRODISCECTOMY Left 05/12/2015   Procedure: Left L4-5 Microdiscectomy;  Surgeon: Oneil JAYSON Herald, MD;  Location: [facility];  Service: Orthopedics;  Laterality: Left;   TONSILLECTOMY     VIDEO BRONCHOSCOPY WITH ENDOBRONCHIAL ULTRASOUND Bilateral 12/20/2021   Procedure: VIDEO BRONCHOSCOPY WITH ENDOBRONCHIAL ULTRASOUND;  Surgeon: Brenna Adine CROME, DO;  Location: [facility];  Service: Pulmonary;  Laterality: Bilateral;   VIDEO BRONCHOSCOPY WITH RADIAL ENDOBRONCHIAL ULTRASOUND  12/20/2021   Procedure: RADIAL ENDOBRONCHIAL ULTRASOUND;  Surgeon: Brenna Adine CROME, DO;  Location: [facility];  Service: Pulmonary;;     Social History:   reports that Diane [name] quit smoking about 2 years ago.  Her smoking use included cigarettes. Diane [name] started smoking about 32 years ago. Diane [name] has a 15 pack-year smoking history. Diane [name] has never used smokeless tobacco. Diane [name] reports current alcohol use of about 3.0 standard drinks of alcohol per week. Diane [name] reports that Diane [name] does not use drugs.   Family History:  Her family history includes Alcohol abuse in her maternal grandfather; Arthritis in her father and mother; Asthma in her mother; COPD in her sister; Cancer in her brother and sister; Depression in her mother; Early death in her sister; Heart disease in her father; Hyperlipidemia in her father and mother; Varicose Veins in her mother; Vision loss in her father. There is no history of Colon cancer.   Allergies Allergies  Allergen Reactions   Penicillins Shortness Of Breath   Alpha-Gal Other (See Comments)    Abdominal Pain All four legged mammals;  pork, beef, lamb, deer, etc.     Home Medications  Prior to Admission medications   Medication Sig Start Date End Date Taking? Authorizing Provider  acetaminophen  (TYLENOL ) 325 MG tablet Take 2 tablets (650 mg total) by mouth every 4 (four) hours as needed for mild pain (pain score 1-3) or fever (or Fever >/= 101). 04/01/24  Yes Emokpae, Courage, MD  DULoxetine  (CYMBALTA ) 30 MG capsule Take 30 mg by mouth daily. 03/06/24  Yes [provider]  gabapentin  (NEURONTIN ) 400 MG tablet Take 100 mg by mouth 3 (three) times daily. 03/06/24  Yes [provider]  HYDROcodone  bit-homatropine (HYCODAN) 5-1.5 MG/5ML syrup Take 5 mLs by mouth every 6 (six) hours as needed for cough. 06/11/24  Yes Ruthell Lauraine FALCON, NP  HYDROcodone -acetaminophen  (NORCO/VICODIN) 5-325 MG tablet Take 1 tablet by mouth every 4 (four) hours as needed for moderate pain (pain score 4-6) or severe pain (pain score 7-10). 06/12/24  Yes [provider]  loratadine  (CLARITIN ) 10 MG tablet Take 10 mg by mouth daily.   Yes [provider]  omeprazole  (PRILOSEC) 20 MG capsule Take 1 capsule (20 mg total) by mouth 2 (two) times daily. (Needs to be seen before next refill) 11/15/20  Yes Joyce, Britney F, FNP  ondansetron  (ZOFRAN ) 8 MG tablet Take 1 tablet (8 mg total) by mouth every 8 (eight) hours as needed for nausea or vomiting. 06/18/24  Yes Sherrod Sherrod, MD  Pirfenidone  (ESBRIET ) 267 MG TABS Take 3 tablets (801 mg total) by mouth 3 (three) times daily with meals. (Account: [email or web address]) 11/14/23  Yes Geronimo Amel, MD  Polyethyl Glycol-Propyl Glycol 0.4-0.3 % SOLN Place 1 drop into both eyes in the morning and at bedtime.   Yes [provider]  Probiotic Product (PROBIOTIC DAILY PO) Take 1 capsule by mouth daily.   Yes [provider]  prochlorperazine  (COMPAZINE ) 10 MG tablet Take 1 tablet (10 mg total) by mouth every 6 (six) hours as needed for nausea or vomiting. 06/18/24   Yes Sherrod Sherrod, MD  rosuvastatin  (CRESTOR ) 10 MG tablet Take 10 mg by mouth at bedtime. 12/05/21  Yes [provider]  saline (AYR) GEL Place 1 Application into both nostrils in the morning and at bedtime.   Yes [provider]  traZODone  (DESYREL ) 100 MG tablet Take 1 tablet (100 mg total) by mouth at bedtime. For Sleep 04/01/24  Yes Emokpae, Courage, MD  albuterol  (PROVENTIL ) (2.5 MG/3ML) 0.083% nebulizer solution Take 3 mLs (2.5 mg total) by nebulization every 2 (two) hours as needed for wheezing or shortness of breath. Patient not taking: Reported on 07/03/2024 04/01/24   Pearlean Manus,  MD  guaiFENesin  (MUCINEX ) 600 MG 12 hr tablet Take 1 tablet (600 mg total) by mouth 2 (two) times daily. Patient not taking: Reported on 07/03/2024 04/01/24 04/01/25  Pearlean Manus, MD     Donnice JONELLE Beals, MD [facility]

## 2024-07-07 NOTE — Plan of Care (Signed)
  Problem: Clinical Measurements: Goal: Will remain free from infection Outcome: Progressing Goal: Cardiovascular complication will be avoided Outcome: Progressing   Problem: Activity: Goal: Risk for activity intolerance will decrease Outcome: Progressing   Problem: Nutrition: Goal: Adequate nutrition will be maintained Outcome: Progressing   Problem: Coping: Goal: Level of anxiety will decrease Outcome: Progressing   Problem: Elimination: Goal: Will not experience complications related to bowel motility Outcome: Progressing Goal: Will not experience complications related to urinary retention Outcome: Progressing   Problem: Pain Managment: Goal: General experience of comfort will improve and/or be controlled Outcome: Progressing   Problem: Safety: Goal: Ability to remain free from injury will improve Outcome: Progressing   Problem: Skin Integrity: Goal: Risk for impaired skin integrity will decrease Outcome: Progressing

## 2024-07-07 NOTE — Plan of Care (Signed)

## 2024-07-08 DIAGNOSIS — Z515 Encounter for palliative care: Secondary | ICD-10-CM

## 2024-07-08 DIAGNOSIS — C3490 Malignant neoplasm of unspecified part of unspecified bronchus or lung: Secondary | ICD-10-CM | POA: Diagnosis not present

## 2024-07-08 DIAGNOSIS — J189 Pneumonia, unspecified organism: Secondary | ICD-10-CM | POA: Diagnosis not present

## 2024-07-08 DIAGNOSIS — J449 Chronic obstructive pulmonary disease, unspecified: Secondary | ICD-10-CM

## 2024-07-08 DIAGNOSIS — J9611 Chronic respiratory failure with hypoxia: Secondary | ICD-10-CM

## 2024-07-08 LAB — CULTURE, BLOOD (ROUTINE X 2)
Culture: NO GROWTH
Culture: NO GROWTH
Special Requests: ADEQUATE
Special Requests: ADEQUATE

## 2024-07-08 MED ORDER — LORAZEPAM 1 MG PO TABS: 1.0000 mg | ORAL_TABLET | ORAL | Status: DC | PRN

## 2024-07-08 MED ORDER — MORPHINE SULFATE 10 MG/5ML PO SOLN
2.5000 mg | ORAL | Status: DC | PRN
  Administered 2024-07-08 – 2024-07-09 (×4): 2.5 mg via ORAL
  Filled 2024-07-08 (×4): qty 5

## 2024-07-08 NOTE — Plan of Care (Signed)

## 2024-07-08 NOTE — Progress Notes (Signed)
 Patient refused CPT this morning. Will resume at noon today.

## 2024-07-08 NOTE — Telephone Encounter (Signed)
Sent to [employer]

## 2024-07-08 NOTE — Progress Notes (Signed)
 PROGRESS NOTE  Diane [name]  FMW:[id number] DOB: December 17, 1955 DOA: 07/02/2024 PCP: [name], MD   Brief Narrative: Patient is a 68 year old female with history of COPD, pulmonary fibrosis, non-small cell lung cancer on Keytruda , chronic hypoxic respiratory failure on 8 L of oxygen  at home at baseline. She  was sent from oncology office for the evaluation of weakness, cough, right-sided pleuritic chest pain.  No history of fever but reported to have chills on presentation, she was hemodynamically stable, requiring her baseline oxygen .  Lab work did not show any leukocytosis.  CT chest was negative for PE but showed right perihilar masslike opacity concerning for consolidation.  Patient started on broad-spectrum antibiotics.  Currently being managed for multifocal pneumonia.  She  remains  short of breath and desaturates easily while minimal ambulation.   PCCM consulted .  Started on iv steroids, antibiotics broadened to cefepime .Palliative care also consulted  Assessment & Plan:  Principal Problem:   Pneumonia   Community acquired  pneumonia: Presented with cough, weakness, right-sided pleuritic chest pain.  Reported chills at home.  No leukocytosis on presentation. right perihilar masslike opacity concerning for consolidation.  Patient was started on broad-spectrum antibiotics.  Blood cultures remain negative. Cough persisted, she desaturated easily while ambulation but she at her baseline oxygen  requirement of high flow 8 L/min. Due to her chronic lung comorbidities, we consulted PCCM .  Follow-up chest x-ray done on 9/19 showed right lung opacities typical of pneumonia , not worsening of pneumonia. Started on Solu-Medrol .  Antibiotics broadened to cefepime .  Respiratory culture showing few gram-negative rods, will follow-up sensitivity  History of chronic respiratory failure/pulmonary fibrosis: Chronically on high flow  8 L of oxygen  at home.  Currently on the same.  Continue  bronchodilators as needed.  On pirfenidone  for pulmonary fibrosis.  Follows with Dr. Bubba.  PCCM following  Non-small cell lung cancer: On Keytruda .  Follows with oncology.  Past smoker  Neuropathic pain: On Cymbalta , Neurontin   Normocytic anemia: No evidence of acute blood loss.  Likely anemia of chronic disease associated with malignancy  Goals of care: CODE STATUS is DNR.  Patient was previously under hospice services but recently stopped following with hospice because she was started on Keytruda .  She might be a candidate for hospice again.  She understands this.  Palliative care consulted.  Deconditioning/Debility: PT consulted, no follow-up recommended  Constipation: Continue bowel regimen.           DVT prophylaxis:SCDs Start: 07/02/24 2241     Code Status: Limited: Do not attempt resuscitation (DNR) -DNR-LIMITED -Do Not Intubate/DNI   Family Communication: None at the bedside.She says no need to call her family members.  Patient status:Inpatient  Patient is from :Home  Anticipated discharge un:[facility]nfz  Estimated DC date: 2-3  days.  After improvement in her dyspnea symptoms, also waiting for palliative care evaluation.   Consultants: PCCM  Procedures:None  Antimicrobials:  Anti-infectives (From admission, onward)    Start     Dose/Rate Route Frequency Ordered Stop   07/06/24 1200  ceFEPIme  (MAXIPIME ) 2 g in sodium chloride  0.9 % 100 mL IVPB        2 g 200 mL/hr over 30 Minutes Intravenous Every 8 hours 07/06/24 1139     07/04/24 1000  cefTRIAXone  (ROCEPHIN ) 2 g in sodium chloride  0.9 % 100 mL IVPB  Status:  Discontinued        2 g 200 mL/hr over 30 Minutes Intravenous Every 24 hours 07/04/24 0906 07/06/24 1129  07/03/24 1000  cefTRIAXone  (ROCEPHIN ) 1 g in sodium chloride  0.9 % 100 mL IVPB  Status:  Discontinued        1 g 200 mL/hr over 30 Minutes Intravenous Every 24 hours 07/02/24 2240 07/04/24 0906   07/03/24 1000  doxycycline  (VIBRA -TABS) tablet 100  mg        100 mg Oral Every 12 hours 07/02/24 2240     07/02/24 2045  cefTRIAXone  (ROCEPHIN ) 1 g in sodium chloride  0.9 % 100 mL IVPB        1 g 200 mL/hr over 30 Minutes Intravenous  Once 07/02/24 2038 07/02/24 2300   07/02/24 2045  doxycycline  (VIBRA -TABS) tablet 100 mg        100 mg Oral  Once 07/02/24 2038 07/02/24 2147       Subjective: Patient seen and examined at bedside today.  Almost like yesterday.  Became dyspneic on slight movement.  Also coughing.  She also saw some blood in her sputum.  Had a bowel movement yesterday.  At rest, she is on high flow 8 L which is her baseline.  Objective: Vitals:   07/07/24 2215 07/08/24 0503 07/08/24 0919 07/08/24 0923  BP: 121/64 118/61    Pulse: 93 84    Resp: 18 18    Temp: 98.2 F (36.8 C) 98.2 F (36.8 C)    TempSrc: Oral Oral    SpO2: 95% (!) 89% 93% 93%  Weight:      Height:        Intake/Output Summary (Last 24 hours) at 07/08/2024 1102 Last data filed at 07/07/2024 1330 Gross per 24 hour  Intake 240 ml  Output --  Net 240 ml   Filed Weights   07/02/24 2225  Weight: 67.2 kg    Examination:   General exam: Overall comfortable, not in distress, pleasant female HEENT: PERRL Respiratory system: Bilateral coarse breath sounds, no wheezing or crackles Cardiovascular system: S1 & S2 heard, RRR.  Gastrointestinal system: Abdomen is nondistended, soft and nontender. Central nervous system: Alert and oriented Extremities: No edema, no clubbing ,no cyanosis Skin: No rashes, no ulcers,no icterus      Data Reviewed: I have personally reviewed following labs and imaging studies  CBC: Recent Labs  Lab 07/02/24 1428 07/02/24 1616 07/03/24 0327 07/06/24 0454  WBC 8.8 9.2 7.4 7.8  NEUTROABS 6.6 6.8  --   --   HGB 9.1* 9.1* 8.7* 8.5*  HCT 28.2* 29.4* 28.3* 28.6*  MCV 90.7 94.5 93.1 94.4  PLT 361 372 380 369   Basic Metabolic Panel: Recent Labs  Lab 07/02/24 1428 07/02/24 1616 07/03/24 0327 07/06/24 0454  NA  136 137 138 135  K 4.6 4.4 4.1 5.0  CL 101 100 100 97*  CO2 31 27 27 27   GLUCOSE 124* 95 109* 97  BUN 10 10 8 15   CREATININE 0.58 0.56 0.59 0.64  CALCIUM  9.1 9.6 9.5 9.3  MG  --   --  2.0  --      Recent Results (from the past 240 hours)  Resp panel by RT-PCR (RSV, Flu A&B, Covid) Anterior Nasal Swab     Status: None   Collection Time: 07/02/24  4:17 PM   Specimen: Anterior Nasal Swab  Result Value Ref Range Status   SARS Coronavirus 2 by RT PCR NEGATIVE NEGATIVE Final    Comment: (NOTE) SARS-CoV-2 target nucleic acids are NOT DETECTED.  The SARS-CoV-2 RNA is generally detectable in upper respiratory specimens during the acute phase of infection.  The lowest concentration of SARS-CoV-2 viral copies this assay can detect is 138 copies/mL. A negative result does not preclude SARS-Cov-2 infection and should not be used as the sole basis for treatment or other patient management decisions. A negative result may occur with  improper specimen collection/handling, submission of specimen other than nasopharyngeal swab, presence of viral mutation(s) within the areas targeted by this assay, and inadequate number of viral copies(<138 copies/mL). A negative result must be combined with clinical observations, patient history, and epidemiological information. The expected result is Negative.  Fact Sheet for Patients:  BloggerCourse.com  Fact Sheet for Healthcare Providers:  SeriousBroker.it  This test is no t yet approved or cleared by the [place]  FDA and  has been authorized for detection and/or diagnosis of SARS-CoV-2 by FDA under an Emergency Use Authorization (EUA). This EUA will remain  in effect (meaning this test can be used) for the duration of the COVID-19 declaration under Section 564(b)(1) of the Act, 21 U.S.C.section 360bbb-3(b)(1), unless the authorization is terminated  or revoked sooner.       Influenza A by PCR  NEGATIVE NEGATIVE Final   Influenza B by PCR NEGATIVE NEGATIVE Final    Comment: (NOTE) The Xpert Xpress SARS-CoV-2/FLU/RSV plus assay is intended as an aid in the diagnosis of influenza from Nasopharyngeal swab specimens and should not be used as a sole basis for treatment. Nasal washings and aspirates are unacceptable for Xpert Xpress SARS-CoV-2/FLU/RSV testing.  Fact Sheet for Patients: BloggerCourse.com  Fact Sheet for Healthcare Providers: SeriousBroker.it  This test is not yet approved or cleared by the [place]  FDA and has been authorized for detection and/or diagnosis of SARS-CoV-2 by FDA under an Emergency Use Authorization (EUA). This EUA will remain in effect (meaning this test can be used) for the duration of the COVID-19 declaration under Section 564(b)(1) of the Act, 21 U.S.C. section 360bbb-3(b)(1), unless the authorization is terminated or revoked.     Resp Syncytial Virus by PCR NEGATIVE NEGATIVE Final    Comment: (NOTE) Fact Sheet for Patients: BloggerCourse.com  Fact Sheet for Healthcare Providers: SeriousBroker.it  This test is not yet approved or cleared by the [place]  FDA and has been authorized for detection and/or diagnosis of SARS-CoV-2 by FDA under an Emergency Use Authorization (EUA). This EUA will remain in effect (meaning this test can be used) for the duration of the COVID-19 declaration under Section 564(b)(1) of the Act, 21 U.S.C. section 360bbb-3(b)(1), unless the authorization is terminated or revoked.  Performed at [facility], [address], [place], [place] [address]   MRSA Next Gen by PCR, Nasal     Status: None   Collection Time: 07/02/24 10:18 PM   Specimen: Nasal Swab  Result Value Ref Range Status   MRSA by PCR Next Gen NOT DETECTED NOT DETECTED Final    Comment: (NOTE) The GeneXpert MRSA Assay (FDA  approved for NASAL specimens only), is one component of a comprehensive MRSA colonization surveillance program. It is not intended to diagnose MRSA infection nor to guide or monitor treatment for MRSA infections. Test performance is not FDA approved in patients less than 22 years old. Performed at [facility], [address], [place], [place] [address]   Culture, blood (Routine X 2) w Reflex to ID Panel     Status: None   Collection Time: 07/02/24 11:09 PM   Specimen: BLOOD  Result Value Ref Range Status   Specimen Description   Final    BLOOD  LEFT ANTECUBITAL Performed at [facility], [address], [place], [place] [address]    Special Requests   Final    Blood Culture adequate volume BOTTLES DRAWN AEROBIC AND ANAEROBIC Performed at [facility], [address], [place], [place] [address]    Culture   Final    NO GROWTH 5 DAYS Performed at [facility], [address] [place] [place], [place] [address]    Report Status 07/08/2024 FINAL  Final  Culture, blood (Routine X 2) w Reflex to ID Panel     Status: None   Collection Time: 07/02/24 11:09 PM   Specimen: BLOOD LEFT HAND  Result Value Ref Range Status   Specimen Description   Final    BLOOD LEFT HAND Performed at [facility], [address] [place], [place] [address]    Special Requests   Final    Blood Culture adequate volume BOTTLES DRAWN AEROBIC ONLY Performed at [facility], [address], [place], [place] [address]    Culture   Final    NO GROWTH 5 DAYS Performed at [facility], [address], [place], [place] [address]    Report Status 07/08/2024 FINAL  Final  Expectorated Sputum Assessment w Gram Stain, Rflx to Resp Cult     Status: None   Collection Time: 07/06/24  2:18 PM   Specimen: Expectorated Sputum  Result Value Ref Range Status   Specimen Description EXPECTORATED SPUTUM  Final   Special Requests NONE  Final    Sputum evaluation   Final    THIS SPECIMEN IS ACCEPTABLE FOR SPUTUM CULTURE Performed at [facility], [address] [place], [place] [address]    Report Status 07/06/2024 FINAL  Final  Culture, Respiratory w Gram Stain     Status: None (Preliminary result)   Collection Time: 07/06/24  2:18 PM  Result Value Ref Range Status   Specimen Description   Final    EXPECTORATED SPUTUM Performed at [facility], [address] [place], [place] [address]    Special Requests   Final    NONE Reflexed from [id number] Performed at [facility], [address] [place], [place] [address]    Gram Stain NO WBC SEEN RARE GRAM NEGATIVE RODS   Final   Culture   Final    CULTURE REINCUBATED FOR BETTER GROWTH Performed at [facility], [address], [place], [place] [address]    Report Status PENDING  Incomplete     Radiology Studies: No results found.   Scheduled Meds:  albuterol   2.5 mg Nebulization QID   arformoterol   15 mcg Nebulization BID   budesonide  (PULMICORT ) nebulizer solution  0.25 mg Nebulization BID   chlorpheniramine-HYDROcodone   5 mL Oral Q12H   doxycycline   100 mg Oral Q12H   DULoxetine   30 mg Oral Daily   gabapentin   400 mg Oral TID   guaiFENesin   1,200 mg Oral BID   methylPREDNISolone  (SOLU-MEDROL ) injection  125 mg Intravenous Daily   pantoprazole   40 mg Oral Daily   Pirfenidone   801 mg Oral TID WC   polyethylene glycol  17 g Oral Daily   revefenacin   175 mcg Nebulization Daily   rosuvastatin   10 mg Oral QHS   senna-docusate  1 tablet Oral BID   sodium chloride  flush  3 mL Intravenous Q12H   sodium chloride  HYPERTONIC  4 mL Nebulization QID   traZODone   100  mg Oral QHS   Continuous Infusions:  ceFEPime  (MAXIPIME ) IV 2 g (07/08/24 0413)     LOS: 6 days   Ivonne Mustache, MD [facility] P9/23/2025, 11:02 AM

## 2024-07-08 NOTE — Consult Note (Signed)
 Consultation Note Date: 07/08/2024   Patient Name: Diane [name]  DOB: [birth year]  MRN: [id number]  Age / Sex: 68 y.o., female  PCP: [name], MD Referring Physician: Jillian Buttery, MD  Reason for Consultation:  H/O enestage COPD, lung cancer, pulmonary fibrosis, on 8L of oxygen  at home. Worsening respiratory status. Patient receptive about hospice options if she does not improve  HPI/Patient Profile: 68 y.o. female  with past medical history of COPD, pulmonary fibrosis, chronic respiratory failure on 8L oxygen  at home, initial diagnosis unresectable stage IIB non-small cell lung cancer in RLL with R hilar adenopathy (March 2023) s/p chemoradiation with carbo/paclitaxel  with progression with R upper lobe mass, L lung with subpleural nodule, bilateral hilar adenopathy, cervical lymph nodes stared on Keytruda  that was started on 06/25/24- notably she was on hospice from June 2025 to September 2025 when she revoked hospice to start Keytruda . She was admitted on 07/02/2024 from the cancer center for R sided pleuritic chest pain, cough, weakness, shortness of breath. She is being treated for CAP, there are also concerns for cancer progression and for immunotherapy induced pneumonitis.  Palliative medicine consulted for goals of care.  Primary Decision Maker PATIENT- surrogate would be her spouse [name]  Discussion: Chart reviewed including labs, progress notes, imaging from this and previous encounters.  CT scan reviewed- no PE, increased perihilar mass, increased consolidation with ground glass, increasing size of L pulmonary nodule, increasing lymphadenopathy.  Preliminary respiratory culture shows few normal respiratory flora- no staph aureus or pseudomonas.  CBC with normal WBC since admission today is 7.8, Hgb is trending down- 8.5 today. Blood cultures negative. Resp panel negative.  Oncology  notes reviewed- patient was on hospice and observation but revoked hospice when PET scan revealed progression and she was offered Keytruda . She has had one cycle of Keytruda  on 9/10. On evaluation Sharin is awake, alert and oriented. She is currently on 12LPM HFNC. She notes that she has significant shortness of breath with any movement at all.  She previously lived in [facility] [place] [facility]  but relocated to be with her spouse.  She shared that she has been active with [facility] hosp[facility] in the past.  She reports not sleeping well since starting steroids, also has some intermittent anxiety.  She is aware of hospice services and philosophy. She shares that she is unsure if she wants to proceed again with hospice- she first wants to determine if her current issues are due to pnueumonitis or cancer progression vs pneumonia.  Discussed with pulmonology- she is not a candidate for bronch so best way to determine cause will be her clinical response to treatments- recommend continuing treatments through the week before determination made.  We discussed symptom managements. She is agreeing to low dose morphine  to help improve her tolerance for activity. Also recommend lorazepam  for anxiety and to help with sleep.  She is having good bowel movements and is on a daily stool softener. Discussed importance or maintaining bowel regimen when taking opioid medications.   Her code status  is currently DNR/DNI.   SUMMARY OF RECOMMENDATIONS -Stage IV lung cancer- complicated by pnuemonia vs progression vs immunotherapy induced pneumonitis- plan to watch clinical picture over the week for response to interventions -She was previously active with [facility] and would like to proceed with hospice if this appears to be pneumonitis or she does not improve with treatments -Symptom management- morphine  2.5 mg liquid solution q2hr prn - use as premed before ADL's and for SOB; lorazepam  po 1mg  q4hr prn for sleep and for  anxiety -Discussed with attending MD, pulmonology and bedside RN  Code Status/Advance Care Planning:   Code Status: Limited: Do not attempt resuscitation (DNR) -DNR-LIMITED -Do Not Intubate/DNI     Prognosis:   Unable to determine  Discharge Planning: To Be Determined  Primary Diagnoses: Present on Admission:  Pneumonia   Review of Systems  Constitutional:  Positive for activity change.  Respiratory:  Positive for shortness of breath.   All other systems reviewed and are negative.   Physical Exam Vitals and nursing note reviewed.  Constitutional:      General: She is not in acute distress. Pulmonary:     Effort: Pulmonary effort is normal.     Comments: Effort and rate increase with speaking or movement Neurological:     Mental Status: She is alert and oriented to person, place, and time.  Psychiatric:        Thought Content: Thought content normal.     Vital Signs: BP 118/61   Pulse 84   Temp 98.2 F (36.8 C) (Oral)   Resp 18   Ht 5' 5 (1.651 m)   Wt 67.2 kg   SpO2 91%   BMI 24.65 kg/m  Pain Scale: 0-10   Pain Score: Asleep   SpO2: SpO2: 91 % O2 Device:SpO2: 91 % O2 Flow Rate: .O2 Flow Rate (L/min): 12 L/min  IO: Intake/output summary:  Intake/Output Summary (Last 24 hours) at 07/08/2024 1253 Last data filed at 07/07/2024 1330 Gross per 24 hour  Intake 240 ml  Output --  Net 240 ml    LBM: Last BM Date : 07/06/24 Baseline Weight: Weight: 67.2 kg Most recent weight: Weight: 67.2 kg       Thank you for this consult. Palliative medicine will continue to follow and assist as needed.   Signed by: Cassondra Stain, AGNP-C Palliative Medicine  Time includes:   Preparing to see the patient (e.g., review of tests) Obtaining and/or reviewing separately obtained history Performing a medically necessary appropriate examination and/or evaluation Counseling and educating the patient/family/caregiver Ordering medications, tests, or procedures Referring  and communicating with other health care professionals (when not reported separately) Documenting clinical information in the electronic or other health record Independently interpreting results (not reported separately) and communicating results to the patient/family/caregiver Care coordination (not reported separately) Clinical documentation   Please contact Palliative Medicine Team phone at [phone] for questions and concerns.  For individual provider: See Tracey

## 2024-07-08 NOTE — Progress Notes (Signed)
 PT Cancellation Note  Patient Details Name: Diane [name] MRN: [id number] DOB: 01/07/1956   Cancelled Treatment:    Reason Eval/Treat Not Completed:  Pt politely declined participation with PT on today. Will check back another day.    Dannial SQUIBB, PT Acute Rehabilitation  Office: [phone]

## 2024-07-09 DIAGNOSIS — J9621 Acute and chronic respiratory failure with hypoxia: Secondary | ICD-10-CM

## 2024-07-09 DIAGNOSIS — R918 Other nonspecific abnormal finding of lung field: Secondary | ICD-10-CM | POA: Diagnosis not present

## 2024-07-09 DIAGNOSIS — T50905A Adverse effect of unspecified drugs, medicaments and biological substances, initial encounter: Secondary | ICD-10-CM

## 2024-07-09 DIAGNOSIS — Z515 Encounter for palliative care: Secondary | ICD-10-CM | POA: Diagnosis not present

## 2024-07-09 DIAGNOSIS — J984 Other disorders of lung: Secondary | ICD-10-CM

## 2024-07-09 DIAGNOSIS — J189 Pneumonia, unspecified organism: Secondary | ICD-10-CM | POA: Diagnosis not present

## 2024-07-09 LAB — BASIC METABOLIC PANEL WITH GFR
Anion gap: 11 (ref 5–15)
BUN: 21 mg/dL (ref 8–23)
CO2: 27 mmol/L (ref 22–32)
Calcium: 9.1 mg/dL (ref 8.9–10.3)
Chloride: 103 mmol/L (ref 98–111)
Creatinine, Ser: 0.58 mg/dL (ref 0.44–1.00)
GFR, Estimated: >60 mL/min (ref >60)
Glucose, Bld: 104 mg/dL — ABNORMAL HIGH (ref 70–99)
Potassium: 4.1 mmol/L (ref 3.5–5.1)
Sodium: 141 mmol/L (ref 135–145)

## 2024-07-09 LAB — CBC
HCT: 25.9 % — ABNORMAL LOW (ref 36.0–46.0)
Hemoglobin: 8 g/dL — ABNORMAL LOW (ref 12.0–15.0)
MCH: 29.2 pg (ref 26.0–34.0)
MCHC: 30.9 g/dL (ref 30.0–36.0)
MCV: 94.5 fL (ref 80.0–100.0)
Platelets: 417 K/uL — ABNORMAL HIGH (ref 150–400)
RBC: 2.74 MIL/uL — ABNORMAL LOW (ref 3.87–5.11)
RDW: 13 % (ref 11.5–15.5)
WBC: 9.8 K/uL (ref 4.0–10.5)
nRBC: 0 % (ref 0.0–0.2)

## 2024-07-09 LAB — CULTURE, RESPIRATORY W GRAM STAIN
Culture: NORMAL
Gram Stain: NONE SEEN

## 2024-07-09 MED ORDER — MORPHINE SULFATE (PF) 2 MG/ML IV SOLN: 2.0000 mg | INTRAVENOUS | Status: DC | PRN

## 2024-07-09 MED ORDER — MORPHINE SULFATE 10 MG/5ML PO SOLN: 2.5000 mg | ORAL | Status: DC

## 2024-07-09 MED ORDER — BUDESONIDE 0.5 MG/2ML IN SUSP
0.5000 mg | Freq: Two times a day (BID) | RESPIRATORY_TRACT | Status: DC
  Administered 2024-07-09 – 2024-07-10 (×3): 0.5 mg via RESPIRATORY_TRACT
  Filled 2024-07-09 (×3): qty 2

## 2024-07-09 MED ORDER — MORPHINE SULFATE 10 MG/5ML PO SOLN
5.0000 mg | ORAL | Status: DC
  Administered 2024-07-09 – 2024-07-11 (×13): 5 mg via ORAL
  Filled 2024-07-09 (×13): qty 5

## 2024-07-09 MED ORDER — CHLORHEXIDINE GLUCONATE CLOTH 2 % EX PADS
6.0000 | MEDICATED_PAD | Freq: Every day | CUTANEOUS | Status: DC
  Administered 2024-07-10 – 2024-07-13 (×4): 6 via TOPICAL

## 2024-07-09 MED ORDER — LORAZEPAM 2 MG/ML IJ SOLN: 1.0000 mg | INTRAMUSCULAR | Status: DC | PRN

## 2024-07-09 MED ORDER — MORPHINE SULFATE 10 MG/5ML PO SOLN: 5.0000 mg | ORAL | Status: DC | PRN

## 2024-07-09 MED ORDER — MORPHINE SULFATE (PF) 2 MG/ML IV SOLN
2.0000 mg | INTRAVENOUS | Status: DC | PRN
  Administered 2024-07-12 – 2024-07-13 (×4): 2 mg via INTRAVENOUS
  Filled 2024-07-09 (×4): qty 1

## 2024-07-09 MED ORDER — ALPRAZOLAM 0.5 MG PO TABS
1.0000 mg | ORAL_TABLET | Freq: Three times a day (TID) | ORAL | Status: DC | PRN
  Administered 2024-07-13: 1 mg via ORAL
  Filled 2024-07-09: qty 2

## 2024-07-09 NOTE — TOC Progression Note (Signed)
 Transition of Care [facility]) - Progression Note    Patient Details  Name: Diane [name] MRN: [id number] Date of Birth: 10-Jul-1956  [facility]) CM/SW Contact  Tawni CHRISTELLA Eva, LCSW Phone Number: 07/09/2024, 10:57 AM  Clinical Narrative:     CSW met with pt and pt's spouse Christopher to discuss recommendations for residential hospice services. She has chosen [facility] as she was with this agency in the past. Referral made they plan to see pt today around 1-1:30pm. IP care management following.   Expected Discharge Plan: Hospice Medical Facility Barriers to Discharge: Continued Medical Work up               Expected Discharge Plan and Services                             Date DME Agency Contacted: 07/04/24 Time DME Agency Contacted: 1455 Representative spoke with at DME Agency: Zack             Social Drivers of Health (SDOH) Interventions SDOH Screenings   Food Insecurity: Patient Declined (07/03/2024)  Housing: Patient Declined (07/03/2024)  Transportation Needs: No Transportation Needs (07/03/2024)  Utilities: Not At Risk (07/03/2024)  Alcohol Screen: Low Risk  (04/12/2018)  Depression (PHQ2-9): Low Risk  (06/25/2024)  Social Connections: Moderately Isolated (07/03/2024)  Tobacco Use: Medium Risk (07/02/2024)    Readmission Risk Interventions     No data to display

## 2024-07-09 NOTE — Progress Notes (Addendum)
 Daily Progress Note   Patient Name: Diane [name]       Date: 07/09/2024 DOB: [birth year]  Age: 68 y.o. MRN#: [id number] Attending Physician: Jillian Buttery, MD Primary Care Physician: [name], MD Admit Date: 07/02/2024  Reason for Consultation/Follow-up: Establishing goals of care  Patient Profile/HPI:  68 y.o. female  with past medical history of COPD, pulmonary fibrosis, chronic respiratory failure on 8L oxygen  at home, initial diagnosis unresectable stage IIB non-small cell lung cancer in RLL with R hilar adenopathy (March 2023) s/p chemoradiation with carbo/paclitaxel  with progression with R upper lobe mass, L lung with subpleural nodule, bilateral hilar adenopathy, cervical lymph nodes stared on Keytruda  that was started on 06/25/24- notably she was on hospice from June 2025 to September 2025 when she revoked hospice to start Keytruda . She was admitted on 07/02/2024 from the cancer center for R sided pleuritic chest pain, cough, weakness, shortness of breath. She is being treated for CAP, there are also concerns for cancer progression and for immunotherapy induced pneumonitis.  Palliative medicine consulted for goals of care.   Subjective: Chart reviewed including labs, progress notes, imaging from this and previous encounters.   Case discussed with pulmonology, attending, and bedside RN.  Diane [name]'s respiratory status has worsened today. Pulmonology has discussed with her that this is likely irreversible.  On eval she is awake, alert and oriented. Her spouse is at bedside. She had an event last night where her tank ran out of oxygen  while she was in the bathroom and she had difficulty getting help.  She notes that morphine  has been helpful with her shortness of breath, but issue has  been getting it to her in a timely manner.  She readily shares she is ready for hospice and comfort measures per her discussion with Dr. Annella. She doesn't want to die in the hospital.  We discussed symptom management- scheduled and prn po and IV morphine , xanax , IV lorazepam . She was taking xanax  prior to admission- will restart this.   Given her wishes to discharge- will need to work to get her where she can at least transport on NRB. She is currently on [facility] oxygen .  I discussed possible foley catheter to limit her need to move and reserve her oxygen  requirements. She would like to try bedside commode for now- however, understands I will  place order for foley that can be placed at her and RN's discretion.   Review of Systems  Respiratory:  Positive for shortness of breath.      Physical Exam Vitals and nursing note reviewed.  Cardiovascular:     Rate and Rhythm: Normal rate.  Pulmonary:     Comments: Increased effort and rate with movement Skin:    General: Skin is warm and dry.  Neurological:     General: No focal deficit present.     Mental Status: She is oriented to person, place, and time.             Vital Signs: BP 135/78 (BP Location: Left Arm)   Pulse 89   Temp 98.2 F (36.8 C) (Oral)   Resp 20   Ht 5' 5 (1.651 m)   Wt 67.2 kg   SpO2 (!) 88%   BMI 24.65 kg/m  SpO2: SpO2: (!) 88 % O2 Device: O2 Device: (S) Heated High Flow Nasal Cannula O2 Flow Rate: O2 Flow Rate (L/min): 50 L/min  Intake/output summary:  Intake/Output Summary (Last 24 hours) at 07/09/2024 1110 Last data filed at 07/08/2024 2138 Gross per 24 hour  Intake 723 ml  Output --  Net 723 ml   LBM: Last BM Date : 07/08/24 Baseline Weight: Weight: 67.2 kg Most recent weight: Weight: 67.2 kg       Palliative Assessment/Data: PPS: 40%      Patient Active Problem List   Diagnosis Date Noted   Cough 07/02/2024   Pneumonia 07/02/2024   Squamous cell carcinoma metastatic to both lungs (HCC)  06/18/2024   COPD with acute exacerbation (HCC) 03/28/2024   Idiopathic pulmonary fibrosis (HCC) 03/27/2024   Chronic respiratory failure with hypoxia (HCC) 03/27/2024   Interstitial lung disease (HCC) 04/11/2023   Stage II squamous cell carcinoma of right lung (HCC) 01/05/2022   Goals of care, counseling/discussion 01/05/2022   Right lower lobe pulmonary nodule 11/29/2021   Mediastinal adenopathy 11/29/2021   Cavitary lesion of lung 11/29/2021   Left foot pain 06/11/2020   Corns and callosities 06/11/2020   Pruritic erythematous rash 12/03/2019   Epigastric pain 12/03/2019   History of food allergy  04/29/2019   Allergic conjunctivitis 04/29/2019   Perennial allergic rhinitis 11/22/2018   Generalized anxiety disorder 11/22/2018   Controlled substance agreement signed 11/22/2018   Gastroesophageal reflux disease 01/11/2018   Alcohol use 12/18/2016   Insomnia 12/18/2016   Mood disorder 12/18/2016   S/P lumbar discectomy 05/12/2015   Hyperlipidemia 08/16/2014   Other convulsions 03/14/2013    Palliative Care Assessment & Plan    Assessment/Recommendations/Plan  -Stage IV lung cancer- complicated by pnuemonia vs progression vs immunotherapy induced pneumonitis- plan for comfort interventions, patient does not want to die in the hospital- requesting [facility] inpt hospice -Symptom management- morphine  5 mg liquid solution q4 hours scheduled, morphine  5mg  liquid q2hr prn, morphine  IV 2mg  q15 min prn - use as premed before ADL's, for SOB, hopeful this will allow oxygen  requirements to decrease to a level where she can transport;  Order placed for foley cath at patient's discretion for end of life care -Discussed with attending MD, pulmonology and bedside RN  Addendum- returned to patient's room later this afternoon. [facility] has declined her for inpatient hospice due to [facility] oxygen  requirements- but did say they are looking to see if they can get a contract for [facility] machine- will wait to  hear. Per bedside RN she has not been able to tolerate any decrease in  her oxygen  which concerns me for ability to tolerate transport by EMS. I also contacted [facility] and [facility] reps- they can take max 15L oxygen .  Discussed with patient that only option may unfortunately be in hospital death. She is calling family members to come in from out of town.    Code Status:   Code Status: Limited: Do not attempt resuscitation (DNR) -DNR-LIMITED -Do Not Intubate/DNI    Prognosis:  < 2 weeks  Discharge Planning: Hospice facility   Thank you for allowing the Palliative Medicine Team to assist in the care of this patient.  Total time: 75 mins Prolonged billing:  Time includes:   Preparing to see the patient (e.g., review of tests) Obtaining and/or reviewing separately obtained history Performing a medically necessary appropriate examination and/or evaluation Counseling and educating the patient/family/caregiver Ordering medications, tests, or procedures Referring and communicating with other health care professionals (when not reported separately) Documenting clinical information in the electronic or other health record Independently interpreting results (not reported separately) and communicating results to the patient/family/caregiver Care coordination (not reported separately) Clinical documentation  Cassondra Stain, AGNP-C Palliative Medicine   Please contact Palliative Medicine Team phone at [phone] for questions and concerns.

## 2024-07-09 NOTE — Progress Notes (Signed)
 Patient refused CPT this morning. Will attempt again at next scheduled time.

## 2024-07-09 NOTE — Discharge Summary (Signed)
 Physician Discharge Summary  Diane [name] FMW:[id number] DOB: February 07, 1956 DOA: 07/02/2024  PCP: Diane Leta NOVAK, MD  Admit date: 07/02/2024 Discharge date: 07/09/2024  Admitted From: Home Disposition:  Residential Hospice  Discharge Condition:Stable CODE STATUS:Comfort Care Diet recommendation: Regular   Brief/Interim Summary: Patient is a 68 year old female with history of COPD, pulmonary fibrosis, non-small cell lung cancer on Keytruda , chronic hypoxic respiratory failure on 8 L of oxygen  at home at baseline. She was sent from oncology office for the evaluation of weakness, cough, right-sided pleuritic chest pain. No history of fever but reported to have chills on presentation, she was hemodynamically stable, requiring her baseline oxygen . Lab work did not show any leukocytosis. CT chest was negative for PE but showed right perihilar masslike opacity concerning for consolidation. Patient started on broad-spectrum antibiotics.  She was managed for multifocal pneumonia.  Prolonged hospitalization without any clinical improvement.  PCCM was also consulted.  Antibiotics broadened, started on IV steroids but her dyspnea did not improve.  She continued to require more oxygen  to maintain her saturation.  Pallaitive care consulted.  After extensive goals of care discussion, she opted for residential hospice.  Care transitioned to comfort.  Now the plan is to transfer her to residential hospice whenever possible    Discharge Diagnoses:  Principal Problem:   Pneumonia    Discharge Instructions  Discharge Instructions     Diet general   Complete by: As directed       Allergies as of 07/09/2024       Reactions   Penicillins Shortness Of Breath   Alpha-gal Other (See Comments)   Abdominal Pain All four legged mammals; pork, beef, lamb, deer, etc.        Medication List     STOP taking these medications    acetaminophen  325 MG tablet Commonly known as: TYLENOL    albuterol   (2.5 MG/3ML) 0.083% nebulizer solution Commonly known as: PROVENTIL    DULoxetine  30 MG capsule Commonly known as: CYMBALTA    gabapentin  400 MG tablet Commonly known as: NEURONTIN    guaiFENesin  600 MG 12 hr tablet Commonly known as: Mucinex    HYDROcodone  bit-homatropine 5-1.5 MG/5ML syrup Commonly known as: HYCODAN   HYDROcodone -acetaminophen  5-325 MG tablet Commonly known as: NORCO/VICODIN   loratadine  10 MG tablet Commonly known as: CLARITIN    omeprazole  20 MG capsule Commonly known as: PRILOSEC   ondansetron  8 MG tablet Commonly known as: Zofran    Pirfenidone  267 MG Tabs Commonly known as: Esbriet    Polyethyl Glycol-Propyl Glycol 0.4-0.3 % Soln   PROBIOTIC DAILY PO   prochlorperazine  10 MG tablet Commonly known as: COMPAZINE    rosuvastatin  10 MG tablet Commonly known as: CRESTOR    saline Gel   traZODone  100 MG tablet Commonly known as: DESYREL                Durable Medical Equipment  (From admission, onward)           Start     Ordered   07/04/24 1459  For home use only DME 4 wheeled rolling walker with seat  Once       Question:  Patient needs a walker to treat with the following condition  Answer:  Balance disorder   07/04/24 1459            Allergies  Allergen Reactions   Penicillins Shortness Of Breath   Alpha-Gal Other (See Comments)    Abdominal Pain All four legged mammals; pork, beef, lamb, deer, etc.    Consultations: PCCM, palliative care  Procedures/Studies: DG CHEST PORT 1 VIEW Result Date: 07/04/2024 EXAM: 1 VIEW XRAY OF THE CHEST 07/04/2024 01:10:00 PM COMPARISON: 03/27/2024, 07/02/2024 CLINICAL HISTORY: PNA (pneumonia) [id number]. F/u exam for PNA; Hx of lung cancer, COPD, emphysema, pulmonary fibrosis, former smoker. FINDINGS: LUNGS AND PLEURA: Right lung airspace opacities, grossly stable. Stable chronic lung disease at the left lung base. Background pulmonary fibrosis. Small right pleural effusion. HEART AND  MEDIASTINUM: Unchanged heart size and mediastinal contours. BONES AND SOFT TISSUES: No acute osseous abnormality. IMPRESSION: 1. Right lung opacities typical of pneumonia. Direct comparison with recent CT is difficult due to differences in modality, but grossly stable. 2. Small right pleural effusion. 3. Chronic lung disease. Electronically signed by: Andrea Gasman MD 07/04/2024 03:10 PM EDT RP Workstation: [id number]   CT Angio Chest PE W and/or Wo Contrast Result Date: 07/02/2024 CLINICAL DATA:  Stage IV lung cancer. Right-sided chest pain, chills, cough and fatigue. EXAM: CT ANGIOGRAPHY CHEST WITH CONTRAST TECHNIQUE: Multidetector CT imaging of the chest was performed using the standard protocol during bolus administration of intravenous contrast. Multiplanar CT image reconstructions and MIPs were obtained to evaluate the vascular anatomy. RADIATION DOSE REDUCTION: This exam was performed according to the departmental dose-optimization program which includes automated exposure control, adjustment of the mA and/or kV according to patient size and/or use of iterative reconstruction technique. CONTRAST:  75mL OMNIPAQUE  IOHEXOL  350 MG/ML SOLN COMPARISON:  PET CT 05/14/2024, chest CTA 03/27/2024 FINDINGS: Cardiovascular: There are no filling defects within the pulmonary arteries to suggest pulmonary embolus. The segmental right upper lobe pulmonary arteries are attenuated. The heart is enlarged. There are coronary artery calcifications. Aortic atherosclerosis. No acute aortic findings. Mediastinum/Nodes: 15 mm left hilar lymph node. 20 mm subcarinal lymph node. There are multiple prominent anterior paratracheal and upper mediastinal nodes. Many of these nodes have increased in size from prior PET. Patulous esophagus. Small hiatal hernia. Lungs/Pleura: Right perihilar masslike opacity has increased from prior exam, margins are poorly defined. Consolidation extends peripherally towards the apex with surrounding  ground-glass, new. There is increasing consolidation within the perihilar right lower lobe. Increasing consolidation in the dependent right lower lobe. Advanced emphysema with ground-glass attenuation and fibrotic change. Right pleural thickening without significant effusion. The left lower lobe pulmonary nodule has increased, 12 mm in thickness, previously 7 mm, series 12, image 95. No pneumothorax. Upper Abdomen: Cholecystectomy.  No acute upper abdominal findings. Musculoskeletal: No focal bone lesion or acute osseous finding. Review of the MIP images confirms the above findings. IMPRESSION: 1. No pulmonary embolus. 2. Right perihilar masslike opacity has increased from prior exam, margins are poorly defined. Consolidation extends peripherally towards the apex with surrounding ground-glass, new. There is increasing consolidation within the perihilar right lower lobe and dependent right lower. Findings may represent worsening neoplastic process, pneumonia, or combination there of. 3. Increasing size of left lower lobe pulmonary nodule, suspicious for progression of disease. 4. Increasing mediastinal and left hilar adenopathy, also suspicious for progression of disease. Aortic Atherosclerosis (ICD10-I70.0) and Emphysema (ICD10-J43.9). Electronically Signed   By: Andrea Gasman M.D.   On: 07/02/2024 20:06      Subjective: Patient seen and examined at bedside today.  She was short of breath, coughing.  She was on 15 L of high flow oxygen .  Does not feel good.  .  Long discussion held at bedside about goals of care. she was to be enrolled into hospice .  She is agreeable for inpatient residential hospice  Discharge Exam: Vitals:   07/09/24 1002 07/09/24  1018  BP:    Pulse:  89  Resp:  20  Temp:    SpO2: (!) 85% (!) 88%   Vitals:   07/09/24 0947 07/09/24 0954 07/09/24 1002 07/09/24 1018  BP:      Pulse:    89  Resp: (!) 23 (!) 26  20  Temp:      TempSrc:      SpO2:  97% (!) 85% (!) 88%   Weight:      Height:        General: Pt is alert, awake, in moderate respiratory distress Cardiovascular: RRR, S1/S2 +, no rubs, no gallops Respiratory: Diminished sounds bilaterally, bilateral coarse breathing sounds, crackles Abdominal: Soft, NT, ND, bowel sounds + Extremities: no edema, no cyanosis    The results of significant diagnostics from this hospitalization (including imaging, microbiology, ancillary and laboratory) are listed below for reference.     Microbiology: Recent Results (from the past 240 hours)  Resp panel by RT-PCR (RSV, Flu A&B, Covid) Anterior Nasal Swab     Status: None   Collection Time: 07/02/24  4:17 PM   Specimen: Anterior Nasal Swab  Result Value Ref Range Status   SARS Coronavirus 2 by RT PCR NEGATIVE NEGATIVE Final    Comment: (NOTE) SARS-CoV-2 target nucleic acids are NOT DETECTED.  The SARS-CoV-2 RNA is generally detectable in upper respiratory specimens during the acute phase of infection. The lowest concentration of SARS-CoV-2 viral copies this assay can detect is 138 copies/mL. A negative result does not preclude SARS-Cov-2 infection and should not be used as the sole basis for treatment or other patient management decisions. A negative result may occur with  improper specimen collection/handling, submission of specimen other than nasopharyngeal swab, presence of viral mutation(s) within the areas targeted by this assay, and inadequate number of viral copies(<138 copies/mL). A negative result must be combined with clinical observations, patient history, and epidemiological information. The expected result is Negative.  Fact Sheet for Patients:  BloggerCourse.com  Fact Sheet for Healthcare Providers:  SeriousBroker.it  This test is no t yet approved or cleared by the [place]  FDA and  has been authorized for detection and/or diagnosis of SARS-CoV-2 by FDA under an Emergency Use  Authorization (EUA). This EUA will remain  in effect (meaning this test can be used) for the duration of the COVID-19 declaration under Section 564(b)(1) of the Act, 21 U.S.C.section 360bbb-3(b)(1), unless the authorization is terminated  or revoked sooner.       Influenza A by PCR NEGATIVE NEGATIVE Final   Influenza B by PCR NEGATIVE NEGATIVE Final    Comment: (NOTE) The Xpert Xpress SARS-CoV-2/FLU/RSV plus assay is intended as an aid in the diagnosis of influenza from Nasopharyngeal swab specimens and should not be used as a sole basis for treatment. Nasal washings and aspirates are unacceptable for Xpert Xpress SARS-CoV-2/FLU/RSV testing.  Fact Sheet for Patients: BloggerCourse.com  Fact Sheet for Healthcare Providers: SeriousBroker.it  This test is not yet approved or cleared by the [place]  FDA and has been authorized for detection and/or diagnosis of SARS-CoV-2 by FDA under an Emergency Use Authorization (EUA). This EUA will remain in effect (meaning this test can be used) for the duration of the COVID-19 declaration under Section 564(b)(1) of the Act, 21 U.S.C. section 360bbb-3(b)(1), unless the authorization is terminated or revoked.     Resp Syncytial Virus by PCR NEGATIVE NEGATIVE Final    Comment: (NOTE) Fact Sheet for Patients: BloggerCourse.com  Fact Sheet for Healthcare Providers:  SeriousBroker.it  This test is not yet approved or cleared by the [place]  FDA and has been authorized for detection and/or diagnosis of SARS-CoV-2 by FDA under an Emergency Use Authorization (EUA). This EUA will remain in effect (meaning this test can be used) for the duration of the COVID-19 declaration under Section 564(b)(1) of the Act, 21 U.S.C. section 360bbb-3(b)(1), unless the authorization is terminated or revoked.  Performed at [facility],  [address], [place], [place] [address]   MRSA Next Gen by PCR, Nasal     Status: None   Collection Time: 07/02/24 10:18 PM   Specimen: Nasal Swab  Result Value Ref Range Status   MRSA by PCR Next Gen NOT DETECTED NOT DETECTED Final    Comment: (NOTE) The GeneXpert MRSA Assay (FDA approved for NASAL specimens only), is one component of a comprehensive MRSA colonization surveillance program. It is not intended to diagnose MRSA infection nor to guide or monitor treatment for MRSA infections. Test performance is not FDA approved in patients less than 1 years old. Performed at [facility], [address] [place], [place] [address]   Culture, blood (Routine X 2) w Reflex to ID Panel     Status: None   Collection Time: 07/02/24 11:09 PM   Specimen: BLOOD  Result Value Ref Range Status   Specimen Description   Final    BLOOD LEFT ANTECUBITAL Performed at [facility], [address] [place], [place] [address]    Special Requests   Final    Blood Culture adequate volume BOTTLES DRAWN AEROBIC AND ANAEROBIC Performed at [facility], [address], [place], [place] [address]    Culture   Final    NO GROWTH 5 DAYS Performed at [facility], [address] [place], [place] [address]    Report Status 07/08/2024 FINAL  Final  Culture, blood (Routine X 2) w Reflex to ID Panel     Status: None   Collection Time: 07/02/24 11:09 PM   Specimen: BLOOD LEFT HAND  Result Value Ref Range Status   Specimen Description   Final    BLOOD LEFT HAND Performed at [facility], [address], [place], [place] [address]    Special Requests   Final    Blood Culture adequate volume BOTTLES DRAWN AEROBIC ONLY Performed at [facility], [address], [place], [place] [address]    Culture   Final    NO GROWTH 5 DAYS Performed at [facility], [address] [place], [place] [address]    Report  Status 07/08/2024 FINAL  Final  Expectorated Sputum Assessment w Gram Stain, Rflx to Resp Cult     Status: None   Collection Time: 07/06/24  2:18 PM   Specimen: Expectorated Sputum  Result Value Ref Range Status   Specimen Description EXPECTORATED SPUTUM  Final   Special Requests NONE  Final   Sputum evaluation   Final    THIS SPECIMEN IS ACCEPTABLE FOR SPUTUM CULTURE Performed at [facility], [address], [place], [place] [address]    Report Status 07/06/2024 FINAL  Final  Culture, Respiratory w Gram Stain     Status: None   Collection Time: 07/06/24  2:18 PM  Result Value Ref Range Status   Specimen Description   Final    EXPECTORATED SPUTUM Performed at [facility], [address] [place], [place] [address]  Special Requests   Final    NONE Reflexed from [id number] Performed at [facility], [address] [place], [place] [address]    Gram Stain NO WBC SEEN RARE GRAM NEGATIVE RODS   Final   Culture   Final    FEW Normal respiratory flora-no Staph aureus or Pseudomonas seen Performed at [facility], [address] [place], [place] [address]    Report Status 07/09/2024 FINAL  Final     Labs: BNP (last 3 results) Recent Labs    02/11/24 1258 03/27/24 1345  BNP 42.0 20.0   Basic Metabolic Panel: Recent Labs  Lab 07/02/24 1428 07/02/24 1616 07/03/24 0327 07/06/24 0454 07/09/24 0423  NA 136 137 138 135 141  K 4.6 4.4 4.1 5.0 4.1  CL 101 100 100 97* 103  CO2 31 27 27 27 27   GLUCOSE 124* 95 109* 97 104*  BUN 10 10 8 15 21   CREATININE 0.58 0.56 0.59 0.64 0.58  CALCIUM  9.1 9.6 9.5 9.3 9.1  MG  --   --  2.0  --   --    Liver Function Tests: Recent Labs  Lab 07/02/24 1428 07/03/24 0327  AST 19 22  ALT 12 12  ALKPHOS 66 72  BILITOT 0.3 0.3  PROT 7.8 7.6  ALBUMIN 3.5 3.4*   No results for input(s): LIPASE, AMYLASE in the last 168 hours. No results for input(s): AMMONIA in the last  168 hours. CBC: Recent Labs  Lab 07/02/24 1428 07/02/24 1616 07/03/24 0327 07/06/24 0454 07/09/24 0423  WBC 8.8 9.2 7.4 7.8 9.8  NEUTROABS 6.6 6.8  --   --   --   HGB 9.1* 9.1* 8.7* 8.5* 8.0*  HCT 28.2* 29.4* 28.3* 28.6* 25.9*  MCV 90.7 94.5 93.1 94.4 94.5  PLT 361 372 380 369 417*   Cardiac Enzymes: No results for input(s): CKTOTAL, CKMB, CKMBINDEX, TROPONINI in the last 168 hours. BNP: Invalid input(s): POCBNP CBG: No results for input(s): GLUCAP in the last 168 hours. D-Dimer No results for input(s): DDIMER in the last 72 hours. Hgb A1c No results for input(s): HGBA1C in the last 72 hours. Lipid Profile No results for input(s): CHOL, HDL, LDLCALC, TRIG, CHOLHDL, LDLDIRECT in the last 72 hours. Thyroid function studies No results for input(s): TSH, T4TOTAL, T3FREE, THYROIDAB in the last 72 hours.  Invalid input(s): FREET3 Anemia work up No results for input(s): VITAMINB12, FOLATE, FERRITIN, TIBC, IRON, RETICCTPCT in the last 72 hours. Urinalysis    Component Value Date/Time   COLORURINE YELLOW 03/27/2024 1432   APPEARANCEUR CLEAR 03/27/2024 1432   APPEARANCEUR Clear 03/10/2022 0859   LABSPEC 1.019 03/27/2024 1432   PHURINE 5.0 03/27/2024 1432   GLUCOSEU NEGATIVE 03/27/2024 1432   HGBUR NEGATIVE 03/27/2024 1432   BILIRUBINUR NEGATIVE 03/27/2024 1432   BILIRUBINUR Negative 03/10/2022 0859   KETONESUR NEGATIVE 03/27/2024 1432   PROTEINUR NEGATIVE 03/27/2024 1432   UROBILINOGEN 0.2 10/06/2012 2112   NITRITE NEGATIVE 03/27/2024 1432   LEUKOCYTESUR SMALL (A) 03/27/2024 1432   Sepsis Labs Recent Labs  Lab 07/02/24 1616 07/03/24 0327 07/06/24 0454 07/09/24 0423  WBC 9.2 7.4 7.8 9.8   Microbiology Recent Results (from the past 240 hours)  Resp panel by RT-PCR (RSV, Flu A&B, Covid) Anterior Nasal Swab     Status: None   Collection Time: 07/02/24  4:17 PM   Specimen: Anterior Nasal Swab  Result Value Ref Range  Status   SARS Coronavirus 2 by RT PCR NEGATIVE NEGATIVE Final  Comment: (NOTE) SARS-CoV-2 target nucleic acids are NOT DETECTED.  The SARS-CoV-2 RNA is generally detectable in upper respiratory specimens during the acute phase of infection. The lowest concentration of SARS-CoV-2 viral copies this assay can detect is 138 copies/mL. A negative result does not preclude SARS-Cov-2 infection and should not be used as the sole basis for treatment or other patient management decisions. A negative result may occur with  improper specimen collection/handling, submission of specimen other than nasopharyngeal swab, presence of viral mutation(s) within the areas targeted by this assay, and inadequate number of viral copies(<138 copies/mL). A negative result must be combined with clinical observations, patient history, and epidemiological information. The expected result is Negative.  Fact Sheet for Patients:  BloggerCourse.com  Fact Sheet for Healthcare Providers:  SeriousBroker.it  This test is no t yet approved or cleared by the [place]  FDA and  has been authorized for detection and/or diagnosis of SARS-CoV-2 by FDA under an Emergency Use Authorization (EUA). This EUA will remain  in effect (meaning this test can be used) for the duration of the COVID-19 declaration under Section 564(b)(1) of the Act, 21 U.S.C.section 360bbb-3(b)(1), unless the authorization is terminated  or revoked sooner.       Influenza A by PCR NEGATIVE NEGATIVE Final   Influenza B by PCR NEGATIVE NEGATIVE Final    Comment: (NOTE) The Xpert Xpress SARS-CoV-2/FLU/RSV plus assay is intended as an aid in the diagnosis of influenza from Nasopharyngeal swab specimens and should not be used as a sole basis for treatment. Nasal washings and aspirates are unacceptable for Xpert Xpress SARS-CoV-2/FLU/RSV testing.  Fact Sheet for  Patients: BloggerCourse.com  Fact Sheet for Healthcare Providers: SeriousBroker.it  This test is not yet approved or cleared by the [place]  FDA and has been authorized for detection and/or diagnosis of SARS-CoV-2 by FDA under an Emergency Use Authorization (EUA). This EUA will remain in effect (meaning this test can be used) for the duration of the COVID-19 declaration under Section 564(b)(1) of the Act, 21 U.S.C. section 360bbb-3(b)(1), unless the authorization is terminated or revoked.     Resp Syncytial Virus by PCR NEGATIVE NEGATIVE Final    Comment: (NOTE) Fact Sheet for Patients: BloggerCourse.com  Fact Sheet for Healthcare Providers: SeriousBroker.it  This test is not yet approved or cleared by the [place]  FDA and has been authorized for detection and/or diagnosis of SARS-CoV-2 by FDA under an Emergency Use Authorization (EUA). This EUA will remain in effect (meaning this test can be used) for the duration of the COVID-19 declaration under Section 564(b)(1) of the Act, 21 U.S.C. section 360bbb-3(b)(1), unless the authorization is terminated or revoked.  Performed at [facility], [address] [place], [place] [address]   MRSA Next Gen by PCR, Nasal     Status: None   Collection Time: 07/02/24 10:18 PM   Specimen: Nasal Swab  Result Value Ref Range Status   MRSA by PCR Next Gen NOT DETECTED NOT DETECTED Final    Comment: (NOTE) The GeneXpert MRSA Assay (FDA approved for NASAL specimens only), is one component of a comprehensive MRSA colonization surveillance program. It is not intended to diagnose MRSA infection nor to guide or monitor treatment for MRSA infections. Test performance is not FDA approved in patients less than 62 years old. Performed at [facility], [address] [place], [place] [address]    Culture, blood (Routine X 2) w Reflex to ID Panel     Status: None  Collection Time: 07/02/24 11:09 PM   Specimen: BLOOD  Result Value Ref Range Status   Specimen Description   Final    BLOOD LEFT ANTECUBITAL Performed at [facility], [address] [place], [place] [address]    Special Requests   Final    Blood Culture adequate volume BOTTLES DRAWN AEROBIC AND ANAEROBIC Performed at [facility], [address], [place], [place] [address]    Culture   Final    NO GROWTH 5 DAYS Performed at [facility], [address] [place], [place] [address]    Report Status 07/08/2024 FINAL  Final  Culture, blood (Routine X 2) w Reflex to ID Panel     Status: None   Collection Time: 07/02/24 11:09 PM   Specimen: BLOOD LEFT HAND  Result Value Ref Range Status   Specimen Description   Final    BLOOD LEFT HAND Performed at [facility], [address], [place], [place] [address]    Special Requests   Final    Blood Culture adequate volume BOTTLES DRAWN AEROBIC ONLY Performed at [facility], [address], [place], [place] [address]    Culture   Final    NO GROWTH 5 DAYS Performed at [facility], [address] [place], [place] [address]    Report Status 07/08/2024 FINAL  Final  Expectorated Sputum Assessment w Gram Stain, Rflx to Resp Cult     Status: None   Collection Time: 07/06/24  2:18 PM   Specimen: Expectorated Sputum  Result Value Ref Range Status   Specimen Description EXPECTORATED SPUTUM  Final   Special Requests NONE  Final   Sputum evaluation   Final    THIS SPECIMEN IS ACCEPTABLE FOR SPUTUM CULTURE Performed at [facility], [address], [place], [place] [address]    Report Status 07/06/2024 FINAL  Final  Culture, Respiratory w Gram Stain     Status: None   Collection Time: 07/06/24  2:18 PM  Result Value Ref Range Status   Specimen Description   Final     EXPECTORATED SPUTUM Performed at [facility], [address], [place], [place] [address]    Special Requests   Final    NONE Reflexed from [id number] Performed at [facility], [address], [place], [place] [address]    Gram Stain NO WBC SEEN RARE GRAM NEGATIVE RODS   Final   Culture   Final    FEW Normal respiratory flora-no Staph aureus or Pseudomonas seen Performed at [facility], [address] [place], [place] [address]    Report Status 07/09/2024 FINAL  Final    Please note: You were cared for by a hospitalist during your hospital stay. Once you are discharged, your primary care physician will handle any further medical issues. Please note that NO REFILLS for any discharge medications will be authorized once you are discharged, as it is imperative that you return to your primary care physician (or establish a relationship with a primary care physician if you do not have one) for your post hospital discharge needs so that they can reassess your need for medications and monitor your lab values.    Time coordinating discharge: 40 minutes  SIGNED:   Ivonne Mustache, MD  [facility] 07/09/2024, 11:25 AM Pager [phone]  If 7PM-7AM, please contact night-coverage [email or web address] Password [email or web address]

## 2024-07-09 NOTE — Progress Notes (Signed)
 NAME:  Diane [name], MRN:  [id number], DOB:  11/15/1955, LOS: 7 ADMISSION DATE:  07/02/2024, CONSULTATION DATE:  07/06/2024 REFERRING MD:  Dr. Jillian, CHIEF COMPLAINT:  R pleuritic chest pain   History of Present Illness:  Diane [name] is a 68 y/o F with a PMH significant for NSCLC likely stage IV (squamous cell carcinoma) of RUL s/p chemoRx/radiation/immunotherapy (pembrolizumab ), CPFE, chronic hypoxic respiratory failure on 8 L O2 by [place] who presents for R sided pleuritic chest pain found to have pneumonia. Pulmonary consulted for acute on chronic hypoxic respiratory failure.  In the hospital, the patient was started on Ceftriaxone  and doxycycline . MRSA swab negative. RVP negative  Oncologic Background: - Diagnosed with unresectable stage 2B NSCLC in 3/23 with 99% PDL1 expression. Underwent chemoRx and radiation therapy. Last dose 02/27/22 - Undergoing surveillance until 03/2024 when PET/CT showed new hypermetabolic LLL SPN, mediastinal adenopathy, and RUL lung mass. Started on Pembrolizumab  monotherapy given bilateral disease.  Pertinent  Medical History  As above  Significant Hospital Events: Including procedures, antibiotic start and stop dates in addition to other pertinent events   As above  Interim History / Subjective:  Now escalated 12 to 15 L salter from baseline 8 L.  More symptomatic.  She discussed inpatient hospice services.  We discussed that this is not getting better and it will not get better.  Objective    Blood pressure 135/78, pulse 80, temperature 98.2 F (36.8 C), temperature source Oral, resp. rate 19, height 5' 5 (1.651 m), weight 67.2 kg, SpO2 97%.        Intake/Output Summary (Last 24 hours) at 07/09/2024 9047 Last data filed at 07/08/2024 2138 Gross per 24 hour  Intake 963 ml  Output --  Net 963 ml   Filed Weights   07/02/24 2225  Weight: 67.2 kg    Examination: General: Chronically ill appearing, tachypneic HENT: Atraumatic  normocephalic Lungs: Normal work of breathing, vest therapy being performed Cardiovascular: Warm Abdomen: Nondistended Extremities: no edema Neuro: alert, oriented x 3  Resolved problem list   Assessment and Plan   #Acute on chronic hypoxic respiratory failure: Home baseline 6 liters rest, 8 L with exertion #CAP: #Concern for ICI pneumonitis: #Stage IV NSCLC: I reviewed chest imaging independently and patient now has dense consolidation involving right upper and lower lobe that was not there on prior imaging. The differential diagnosis for this includes community acquired pneumonia, immune checkpoint inhibitor pneumonitis given that she was started on pembrolizumab , and progression of lung cancer. MRSA swab is negative and resp culture not collected.  Given progressive malignant disease on the left, I fear worsening malignancy given overall findings on chest x-ray.  Looking.  Patient has had clinical worsening, worsening hypoxemia despite aggressive antibiotics and steroids.   - Solu-Medrol  125 mg IV daily (2 mg per KG) for possible checkpoint inhibitor pneumonitis, unfortunately has worsened not improved. - Cefepime  to cover Pseudomonas given underlying significant emphysema, on doxycycline  for atypical coverage - Aggressive pulmonary hygiene including bronchodilators, mucolytics, and chest physiotherapy - ICS/LAMA/LABA  - Appreciate palliative care assistance, patient is appropriate for inpatient hospice.   Labs   CBC: Recent Labs  Lab 07/02/24 1428 07/02/24 1616 07/03/24 0327 07/06/24 0454 07/09/24 0423  WBC 8.8 9.2 7.4 7.8 9.8  NEUTROABS 6.6 6.8  --   --   --   HGB 9.1* 9.1* 8.7* 8.5* 8.0*  HCT 28.2* 29.4* 28.3* 28.6* 25.9*  MCV 90.7 94.5 93.1 94.4 94.5  PLT 361 372 380 369 417*  Basic Metabolic Panel: Recent Labs  Lab 07/02/24 1428 07/02/24 1616 07/03/24 0327 07/06/24 0454 07/09/24 0423  NA 136 137 138 135 141  K 4.6 4.4 4.1 5.0 4.1  CL 101 100 100 97* 103   CO2 31 27 27 27 27   GLUCOSE 124* 95 109* 97 104*  BUN 10 10 8 15 21   CREATININE 0.58 0.56 0.59 0.64 0.58  CALCIUM  9.1 9.6 9.5 9.3 9.1  MG  --   --  2.0  --   --    GFR: Estimated Creatinine Clearance: 61.4 mL/min (by C-G formula based on SCr of 0.58 mg/dL). Recent Labs  Lab 07/02/24 1616 07/03/24 0327 07/06/24 0454 07/09/24 0423  WBC 9.2 7.4 7.8 9.8    Liver Function Tests: Recent Labs  Lab 07/02/24 1428 07/03/24 0327  AST 19 22  ALT 12 12  ALKPHOS 66 72  BILITOT 0.3 0.3  PROT 7.8 7.6  ALBUMIN 3.5 3.4*   No results for input(s): LIPASE, AMYLASE in the last 168 hours. No results for input(s): AMMONIA in the last 168 hours.  ABG    Component Value Date/Time   PHART 7.48 (H) 03/29/2023 1255   PCO2ART 37 03/29/2023 1255   PO2ART 103 03/29/2023 1255   HCO3 27.6 03/29/2023 1255   O2SAT 99.9 03/29/2023 1255     Coagulation Profile: No results for input(s): INR, PROTIME in the last 168 hours.  Cardiac Enzymes: No results for input(s): CKTOTAL, CKMB, CKMBINDEX, TROPONINI in the last 168 hours.  HbA1C: HB A1C (BAYER DCA - WAIVED)  Date/Time Value Ref Range Status  01/11/2018 08:46 AM 5.1 <7.0 % Final    Comment:                                          Diabetic Adult            <7.0                                       Healthy Adult        4.3 - 5.7                                                           (DCCT/NGSP) American Diabetes Association's Summary of Glycemic Recommendations for Adults with Diabetes: Hemoglobin A1c <7.0%. More stringent glycemic goals (A1c <6.0%) may further reduce complications at the cost of increased risk of hypoglycemia.   12/18/2016 10:03 AM 5.3 <7.0 % Final    Comment:                                          Diabetic Adult            <7.0                                       Healthy Adult        4.3 - 5.7                                                           (  DCCT/NGSP) American Diabetes Association's  Summary of Glycemic Recommendations for Adults with Diabetes: Hemoglobin A1c <7.0%. More stringent glycemic goals (A1c <6.0%) may further reduce complications at the cost of increased risk of hypoglycemia.    Hgb A1c MFr Bld  Date/Time Value Ref Range Status  03/30/2024 02:47 PM 5.4 4.8 - 5.6 % Final    Comment:    (NOTE) Diagnosis of Diabetes The following HbA1c ranges recommended by the American Diabetes Association (ADA) may be used as an aid in the diagnosis of diabetes mellitus.  Hemoglobin             Suggested A1C NGSP%              Diagnosis  <5.7                   Non Diabetic  5.7-6.4                Pre-Diabetic  >6.4                   Diabetic  <7.0                   Glycemic control for                       adults with diabetes.      CBG: No results for input(s): GLUCAP in the last 168 hours.  Review of Systems:   Not obtained  Past Medical History:  She,  has a past medical history of Anxiety, Arthritis, C. difficile diarrhea (2015), COPD (chronic obstructive pulmonary disease) (HCC), Depression, Eczema, H pylori ulcer, Headache(784.0), Hypercholesterolemia, Hypertension, Interstitial lung disease (HCC), Kidney stones, lung ca with recurrence (10/2021), Pneumonia, Seizures (HCC), and Shingles.   Surgical History:   Past Surgical History:  Procedure Laterality Date   ABDOMINAL HYSTERECTOMY     ADENOIDECTOMY     APPENDECTOMY     BRONCHIAL BIOPSY  12/20/2021   Procedure: BRONCHIAL BIOPSIES;  Surgeon: Brenna Adine CROME, DO;  Location: [facility];  Service: Pulmonary;;   BRONCHIAL BRUSHINGS  12/20/2021   Procedure: BRONCHIAL BRUSHINGS;  Surgeon: Brenna Adine CROME, DO;  Location: [facility];  Service: Pulmonary;;   BRONCHIAL NEEDLE ASPIRATION BIOPSY  12/20/2021   Procedure: BRONCHIAL NEEDLE ASPIRATION BIOPSIES;  Surgeon: Brenna Adine CROME, DO;  Location: [facility];  Service: Pulmonary;;   CESAREAN SECTION     CHOLECYSTECTOMY     COLONOSCOPY  06/10/2012    RMR: Colonic polyps -removed as described above.    FINE NEEDLE ASPIRATION  12/20/2021   Procedure: FINE NEEDLE ASPIRATION (FNA) LINEAR;  Surgeon: Brenna Adine CROME, DO;  Location: [facility];  Service: Pulmonary;;   KNEE ARTHROSCOPY  1973   right knee,  torn cart   LUMBAR DISC SURGERY  05/12/2015   L4 L5   LUMBAR LAMINECTOMY/DECOMPRESSION MICRODISCECTOMY Left 05/12/2015   Procedure: Left L4-5 Microdiscectomy;  Surgeon: Oneil JAYSON Herald, MD;  Location: [facility];  Service: Orthopedics;  Laterality: Left;   TONSILLECTOMY     VIDEO BRONCHOSCOPY WITH ENDOBRONCHIAL ULTRASOUND Bilateral 12/20/2021   Procedure: VIDEO BRONCHOSCOPY WITH ENDOBRONCHIAL ULTRASOUND;  Surgeon: Brenna Adine CROME, DO;  Location: [facility];  Service: Pulmonary;  Laterality: Bilateral;   VIDEO BRONCHOSCOPY WITH RADIAL ENDOBRONCHIAL ULTRASOUND  12/20/2021   Procedure: RADIAL ENDOBRONCHIAL ULTRASOUND;  Surgeon: Brenna Adine CROME, DO;  Location: [facility];  Service: Pulmonary;;     Social History:   reports that she quit smoking about 2 years  ago. Her smoking use included cigarettes. She started smoking about 32 years ago. She has a 15 pack-year smoking history. She has never used smokeless tobacco. She reports current alcohol use of about 3.0 standard drinks of alcohol per week. She reports that she does not use drugs.   Family History:  Her family history includes Alcohol abuse in her maternal grandfather; Arthritis in her father and mother; Asthma in her mother; COPD in her sister; Cancer in her brother and sister; Depression in her mother; Early death in her sister; Heart disease in her father; Hyperlipidemia in her father and mother; Varicose Veins in her mother; Vision loss in her father. There is no history of Colon cancer.   Allergies Allergies  Allergen Reactions   Penicillins Shortness Of Breath   Alpha-Gal Other (See Comments)    Abdominal Pain All four legged mammals; pork, beef, lamb, deer, etc.     Home Medications   Prior to Admission medications   Medication Sig Start Date End Date Taking? Authorizing Provider  acetaminophen  (TYLENOL ) 325 MG tablet Take 2 tablets (650 mg total) by mouth every 4 (four) hours as needed for mild pain (pain score 1-3) or fever (or Fever >/= 101). 04/01/24  Yes Emokpae, Courage, MD  DULoxetine  (CYMBALTA ) 30 MG capsule Take 30 mg by mouth daily. 03/06/24  Yes [provider]  gabapentin  (NEURONTIN ) 400 MG tablet Take 100 mg by mouth 3 (three) times daily. 03/06/24  Yes [provider]  HYDROcodone  bit-homatropine (HYCODAN) 5-1.5 MG/5ML syrup Take 5 mLs by mouth every 6 (six) hours as needed for cough. 06/11/24  Yes Ruthell Lauraine FALCON, NP  HYDROcodone -acetaminophen  (NORCO/VICODIN) 5-325 MG tablet Take 1 tablet by mouth every 4 (four) hours as needed for moderate pain (pain score 4-6) or severe pain (pain score 7-10). 06/12/24  Yes [provider]  loratadine  (CLARITIN ) 10 MG tablet Take 10 mg by mouth daily.   Yes [provider]  omeprazole  (PRILOSEC) 20 MG capsule Take 1 capsule (20 mg total) by mouth 2 (two) times daily. (Needs to be seen before next refill) 11/15/20  Yes Joyce, Britney F, FNP  ondansetron  (ZOFRAN ) 8 MG tablet Take 1 tablet (8 mg total) by mouth every 8 (eight) hours as needed for nausea or vomiting. 06/18/24  Yes Sherrod Sherrod, MD  Pirfenidone  (ESBRIET ) 267 MG TABS Take 3 tablets (801 mg total) by mouth 3 (three) times daily with meals. (Account: [email or web address]) 11/14/23  Yes Geronimo Amel, MD  Polyethyl Glycol-Propyl Glycol 0.4-0.3 % SOLN Place 1 drop into both eyes in the morning and at bedtime.   Yes [provider]  Probiotic Product (PROBIOTIC DAILY PO) Take 1 capsule by mouth daily.   Yes [provider]  prochlorperazine  (COMPAZINE ) 10 MG tablet Take 1 tablet (10 mg total) by mouth every 6 (six) hours as needed for nausea or vomiting. 06/18/24  Yes Sherrod Sherrod, MD  rosuvastatin  (CRESTOR ) 10  MG tablet Take 10 mg by mouth at bedtime. 12/05/21  Yes [provider]  saline (AYR) GEL Place 1 Application into both nostrils in the morning and at bedtime.   Yes [provider]  traZODone  (DESYREL ) 100 MG tablet Take 1 tablet (100 mg total) by mouth at bedtime. For Sleep 04/01/24  Yes Emokpae, Courage, MD  albuterol  (PROVENTIL ) (2.5 MG/3ML) 0.083% nebulizer solution Take 3 mLs (2.5 mg total) by nebulization every 2 (two) hours as needed for wheezing or shortness of breath. Patient not taking: Reported on 07/03/2024 04/01/24   Emokpae,  Courage, MD  guaiFENesin  (MUCINEX ) 600 MG 12 hr tablet Take 1 tablet (600 mg total) by mouth 2 (two) times daily. Patient not taking: Reported on 07/03/2024 04/01/24 04/01/25  Pearlean Manus, MD     Donnice JONELLE Beals, MD  Pulmonary and Critical Care

## 2024-07-10 DIAGNOSIS — Z515 Encounter for palliative care: Secondary | ICD-10-CM | POA: Diagnosis not present

## 2024-07-10 DIAGNOSIS — J189 Pneumonia, unspecified organism: Secondary | ICD-10-CM | POA: Diagnosis not present

## 2024-07-10 DIAGNOSIS — C3490 Malignant neoplasm of unspecified part of unspecified bronchus or lung: Secondary | ICD-10-CM | POA: Diagnosis not present

## 2024-07-10 DIAGNOSIS — J984 Other disorders of lung: Secondary | ICD-10-CM | POA: Diagnosis not present

## 2024-07-10 MED ORDER — SPIRITUS FRUMENTI: 2.0000 | ORAL | Status: DC | PRN

## 2024-07-10 NOTE — Progress Notes (Signed)
 Daily Progress Note   Patient Name: Diane [name]       Date: 07/10/2024 DOB: [birth year]  Age: 68 y.o. MRN#: [id number] Attending Physician: Jillian Buttery, MD Primary Care Physician: [name], MD Admit Date: 07/02/2024  Reason for Consultation/Follow-up: Establishing goals of care, Non pain symptom management, and Terminal Care  Patient Profile/HPI:  68 y.o. female with past medical history of COPD, pulmonary fibrosis, chronic respiratory failure on 8L oxygen  at home, initial diagnosis unresectable stage IIB non-small cell lung cancer in RLL with R hilar adenopathy (March 2023) s/p chemoradiation with carbo/paclitaxel  with progression with R upper lobe mass, L lung with subpleural nodule, bilateral hilar adenopathy, cervical lymph nodes stared on Keytruda  that was started on 06/25/24- notably she was on hospice from June 2025 to September 2025 when she revoked hospice to start Keytruda . She was admitted on 07/02/2024 from the cancer center for R sided pleuritic chest pain, cough, weakness, shortness of breath. She is being treated for CAP, there are also concerns for cancer progression and for immunotherapy induced pneumonitis. Palliative medicine consulted for goals of care.   Subjective: Chart reviewed including labs, progress notes, imaging from this and previous encounters.  No IV morphine  needed in the last 24 hours. She is feeling great on heated high flow at 50LPM and scheduled po morphine .  We discussed that unfortunately she won't be able to discharge to an inpatient hospice unit.  She understands and is ok with this.  We discussed the plan going forward. She has family and friends who are traveling to visit with her from out of town. She would like to spend some quality time with  them while she is feeling good.  Should she start to decompensate, or when she is ready we plan to use palliative sedation and then proceed with terminal wean. I discussed the process for this. She would prefer to be completely sedated. I recommended propofol  for sedation vs morphine  and versed . Given her very acute awake mental status and history of anxiety I would prefer propofol . Will discuss with ICU and see if we can transfer her when she is ready.  She is in a good place with no existential or spiritual concerns. Her chaplain is coming to visit with her. She only worries about her husband's well-being after her death.  Emotional support  provided.  Plan discussed with attending Dr. Jillian, bedside RN Powell Elenor Bars RRT.  Review of Systems  All other systems reviewed and are negative.    Physical Exam Vitals and nursing note reviewed.  Constitutional:      General: She is not in acute distress.    Appearance: She is not ill-appearing.  Cardiovascular:     Pulses: Normal pulses.  Pulmonary:     Effort: Pulmonary effort is normal.  Neurological:     Mental Status: She is alert and oriented to person, place, and time.  Psychiatric:        Mood and Affect: Mood normal.        Behavior: Behavior normal.        Thought Content: Thought content normal.        Judgment: Judgment normal.             Vital Signs: BP 122/64 (BP Location: Left Arm)   Pulse 82   Temp 98.3 F (36.8 C) (Oral)   Resp 20   Ht 5' 5 (1.651 m)   Wt 67.2 kg   SpO2 (!) 89% Comment: comfort care  BMI 24.65 kg/m  SpO2: SpO2: (!) 89 % (comfort care) O2 Device: O2 Device: Heated High Flow Nasal Cannula O2 Flow Rate: O2 Flow Rate (L/min): 50 L/min  Intake/output summary: No intake or output data in the 24 hours ending 07/10/24 1024 LBM: Last BM Date : 07/09/24 Baseline Weight: Weight: 67.2 kg Most recent weight: Weight: 67.2 kg       Palliative Assessment/Data: PPS: 50%      Patient  Active Problem List   Diagnosis Date Noted   Cough 07/02/2024   Pneumonia 07/02/2024   Squamous cell carcinoma metastatic to both lungs (HCC) 06/18/2024   COPD with acute exacerbation (HCC) 03/28/2024   Idiopathic pulmonary fibrosis (HCC) 03/27/2024   Chronic respiratory failure with hypoxia (HCC) 03/27/2024   Interstitial lung disease (HCC) 04/11/2023   Stage II squamous cell carcinoma of right lung (HCC) 01/05/2022   Goals of care, counseling/discussion 01/05/2022   Right lower lobe pulmonary nodule 11/29/2021   Mediastinal adenopathy 11/29/2021   Cavitary lesion of lung 11/29/2021   Left foot pain 06/11/2020   Corns and callosities 06/11/2020   Pruritic erythematous rash 12/03/2019   Epigastric pain 12/03/2019   History of food allergy  04/29/2019   Allergic conjunctivitis 04/29/2019   Perennial allergic rhinitis 11/22/2018   Generalized anxiety disorder 11/22/2018   Controlled substance agreement signed 11/22/2018   Gastroesophageal reflux disease 01/11/2018   Alcohol use 12/18/2016   Insomnia 12/18/2016   Mood disorder 12/18/2016   S/P lumbar discectomy 05/12/2015   Hyperlipidemia 08/16/2014   Other convulsions 03/14/2013    Palliative Care Assessment & Plan    Assessment/Recommendations/Plan  Stage IV lung cancer- complicated by pneumonia vs progression of cancer vs immunotherapy induced pnuemonitis - plan for comfort care, allowing time for patient to enjoy visits with family.  When patient is ready plan for terminal wean- will need palliative sedation- recommend propofol  vs morphine  and versed - if we decide on propofol  then will need to move to ICU for sedation and wean    Code Status:   Code Status: Do not attempt resuscitation (DNR) - Comfort care   Prognosis:  < 2 weeks  Discharge Planning: Anticipated Hospital Death   Thank you for allowing the Palliative Medicine Team to assist in the care of this patient.  Total time: 60 minutes  Prolonged billing:   Time includes:  Preparing to see the patient (e.g., review of tests) Obtaining and/or reviewing separately obtained history Performing a medically necessary appropriate examination and/or evaluation Counseling and educating the patient/family/caregiver Ordering medications, tests, or procedures Referring and communicating with other health care professionals (when not reported separately) Documenting clinical information in the electronic or other health record Independently interpreting results (not reported separately) and communicating results to the patient/family/caregiver Care coordination (not reported separately) Clinical documentation  Cassondra Stain, AGNP-C Palliative Medicine   Please contact Palliative Medicine Team phone at [phone] for questions and concerns.

## 2024-07-10 NOTE — Progress Notes (Signed)
 PROGRESS NOTE  Diane [name]  FMW:[id number] DOB: Sep 12, 1956 DOA: 07/02/2024 PCP: [name], MD   Brief Narrative: Patient is a 68 year old female with history of COPD, pulmonary fibrosis, non-small cell lung cancer on Keytruda , chronic hypoxic respiratory failure on 8 L of oxygen  at home at baseline. She was sent from oncology office for the evaluation of weakness, cough, right-sided pleuritic chest pain. No history of fever but reported to have chills on presentation, she was hemodynamically stable, requiring her baseline oxygen . Lab work did not show any leukocytosis. CT chest was negative for PE but showed right perihilar masslike opacity concerning for consolidation. Patient started on broad-spectrum antibiotics.  She was managed for multifocal pneumonia.  Prolonged hospitalization without any clinical improvement.  PCCM was also consulted.  Antibiotics broadened, started on IV steroids but her dyspnea did not improve.  She continued to require more oxygen  to maintain her saturation.  Pallaitive care consulted.  After extensive goals of care discussion, she opted for residential hospice.  Care transitioned to comfort.  Plan was to transfer her to residential hospice but it was not possible due to requirement of high flow oxygen .  Currently on 50 L/min.  Palliative care following  Assessment & Plan:  Principal Problem:   Pneumonia   Community acquired  pneumonia: Presented with cough, weakness, right-sided pleuritic chest pain.  Reported chills at home.  No leukocytosis on presentation. right perihilar masslike opacity concerning for consolidation.  Patient was started on broad-spectrum antibiotics.  Blood cultures remain negative. Cough persisted, she desaturated easily while ambulation . Due to her chronic lung comorbidities, we consulted PCCM .  Follow-up chest x-ray done on 9/19 showed right lung opacities typical of pneumonia , not worsening of pneumonia. Started on Solu-Medrol .   Antibiotics broadened to cefepime .  Respiratory culture showing few gram-negative rods. But dyspnea did not improve.  Currently on comfort care.  History of chronic respiratory failure/pulmonary fibrosis: Chronically on high flow  8 L of oxygen  at home.  She was on pirfenidone  for pulmonary fibrosis.  Follows with Dr. Bubba.   Non-small cell lung cancer: She was on Keytruda .  Follows with oncology.  Past smoker  Neuropathic pain: She was taking  Cymbalta , Neurontin   Normocytic anemia: Likely anemia of chronic disease associated with malignancy  Constipation: Continue bowel regimen.    Goals of care: CODE STATUS is DNR.  Patient was previously under hospice services but recently stopped following with hospice because she was started on Keytruda .   Palliative care consulted.  After goals of care discussion, she has been transitioned to comfort care.  Plan was for transfer to Residential hospice but due to requirement of high flow oxygen , she could not be transferred.  Plan is to continue in-house hospice.  Patient feels much better today.  She is fully alert and oriented.  She wants to spend some time with her family.  After she is ready, palliative  care planning to sedate her and do a terminal wean         DVT prophylaxis:None     Code Status: Do not attempt resuscitation (DNR) - Comfort care  Family Communication: None at the bedside.She says no need to call her family members.  Patient status:Inpatient  Patient is from :Home  Anticipated discharge un:opxzob hospital death  Consultants: PCCM, palliative care  Procedures:None  Antimicrobials:  Anti-infectives (From admission, onward)    Start     Dose/Rate Route Frequency Ordered Stop   07/06/24 1200  ceFEPIme  (MAXIPIME ) 2 g in  sodium chloride  0.9 % 100 mL IVPB  Status:  Discontinued        2 g 200 mL/hr over 30 Minutes Intravenous Every 8 hours 07/06/24 1139 07/09/24 1100   07/04/24 1000  cefTRIAXone  (ROCEPHIN ) 2 g in  sodium chloride  0.9 % 100 mL IVPB  Status:  Discontinued        2 g 200 mL/hr over 30 Minutes Intravenous Every 24 hours 07/04/24 0906 07/06/24 1129   07/03/24 1000  cefTRIAXone  (ROCEPHIN ) 1 g in sodium chloride  0.9 % 100 mL IVPB  Status:  Discontinued        1 g 200 mL/hr over 30 Minutes Intravenous Every 24 hours 07/02/24 2240 07/04/24 0906   07/03/24 1000  doxycycline  (VIBRA -TABS) tablet 100 mg  Status:  Discontinued        100 mg Oral Every 12 hours 07/02/24 2240 07/09/24 1100   07/02/24 2045  cefTRIAXone  (ROCEPHIN ) 1 g in sodium chloride  0.9 % 100 mL IVPB        1 g 200 mL/hr over 30 Minutes Intravenous  Once 07/02/24 2038 07/02/24 2300   07/02/24 2045  doxycycline  (VIBRA -TABS) tablet 100 mg        100 mg Oral  Once 07/02/24 2038 07/02/24 2147       Subjective: Patient seen and examined at bedside today.  Feels much better today.  On 50 L of high flow oxygen .  Appears comfortable.  Fully alert and oriented.  Transfer to residential hospice not possible yesterday.  Her family is coming to meet her  Objective: Vitals:   07/09/24 2046 07/09/24 2048 07/10/24 0108 07/10/24 0819  BP:      Pulse:   81 82  Resp:   20 20  Temp:      TempSrc:      SpO2: 96% 97% 96% (!) 89%  Weight:      Height:       No intake or output data in the 24 hours ending 07/10/24 1115  Filed Weights   07/02/24 2225  Weight: 67.2 kg    Examination:   General exam: Overall comfortable, very pleasant female HEENT: PERRL Respiratory system: Breath sound bilaterally, crackles Cardiovascular system: S1 & S2 heard, RRR.  Gastrointestinal system: Abdomen is nondistended, soft and nontender. Central nervous system: Alert and oriented Extremities: No edema, no clubbing ,no cyanosis Skin: No rashes, no ulcers,no icterus     Data Reviewed: I have personally reviewed following labs and imaging studies  CBC: Recent Labs  Lab 07/06/24 0454 07/09/24 0423  WBC 7.8 9.8  HGB 8.5* 8.0*  HCT 28.6* 25.9*   MCV 94.4 94.5  PLT 369 417*   Basic Metabolic Panel: Recent Labs  Lab 07/06/24 0454 07/09/24 0423  NA 135 141  K 5.0 4.1  CL 97* 103  CO2 27 27  GLUCOSE 97 104*  BUN 15 21  CREATININE 0.64 0.58  CALCIUM  9.3 9.1     Recent Results (from the past 240 hours)  Resp panel by RT-PCR (RSV, Flu A&B, Covid) Anterior Nasal Swab     Status: None   Collection Time: 07/02/24  4:17 PM   Specimen: Anterior Nasal Swab  Result Value Ref Range Status   SARS Coronavirus 2 by RT PCR NEGATIVE NEGATIVE Final    Comment: (NOTE) SARS-CoV-2 target nucleic acids are NOT DETECTED.  The SARS-CoV-2 RNA is generally detectable in upper respiratory specimens during the acute phase of infection. The lowest concentration of SARS-CoV-2 viral copies this assay can detect  is 138 copies/mL. A negative result does not preclude SARS-Cov-2 infection and should not be used as the sole basis for treatment or other patient management decisions. A negative result may occur with  improper specimen collection/handling, submission of specimen other than nasopharyngeal swab, presence of viral mutation(s) within the areas targeted by this assay, and inadequate number of viral copies(<138 copies/mL). A negative result must be combined with clinical observations, patient history, and epidemiological information. The expected result is Negative.  Fact Sheet for Patients:  BloggerCourse.com  Fact Sheet for Healthcare Providers:  SeriousBroker.it  This test is no t yet approved or cleared by the [place]  FDA and  has been authorized for detection and/or diagnosis of SARS-CoV-2 by FDA under an Emergency Use Authorization (EUA). This EUA will remain  in effect (meaning this test can be used) for the duration of the COVID-19 declaration under Section 564(b)(1) of the Act, 21 U.S.C.section 360bbb-3(b)(1), unless the authorization is terminated  or revoked sooner.        Influenza A by PCR NEGATIVE NEGATIVE Final   Influenza B by PCR NEGATIVE NEGATIVE Final    Comment: (NOTE) The Xpert Xpress SARS-CoV-2/FLU/RSV plus assay is intended as an aid in the diagnosis of influenza from Nasopharyngeal swab specimens and should not be used as a sole basis for treatment. Nasal washings and aspirates are unacceptable for Xpert Xpress SARS-CoV-2/FLU/RSV testing.  Fact Sheet for Patients: BloggerCourse.com  Fact Sheet for Healthcare Providers: SeriousBroker.it  This test is not yet approved or cleared by the [place]  FDA and has been authorized for detection and/or diagnosis of SARS-CoV-2 by FDA under an Emergency Use Authorization (EUA). This EUA will remain in effect (meaning this test can be used) for the duration of the COVID-19 declaration under Section 564(b)(1) of the Act, 21 U.S.C. section 360bbb-3(b)(1), unless the authorization is terminated or revoked.     Resp Syncytial Virus by PCR NEGATIVE NEGATIVE Final    Comment: (NOTE) Fact Sheet for Patients: BloggerCourse.com  Fact Sheet for Healthcare Providers: SeriousBroker.it  This test is not yet approved or cleared by the [place]  FDA and has been authorized for detection and/or diagnosis of SARS-CoV-2 by FDA under an Emergency Use Authorization (EUA). This EUA will remain in effect (meaning this test can be used) for the duration of the COVID-19 declaration under Section 564(b)(1) of the Act, 21 U.S.C. section 360bbb-3(b)(1), unless the authorization is terminated or revoked.  Performed at [facility], [address], [place], [place] [address]   MRSA Next Gen by PCR, Nasal     Status: None   Collection Time: 07/02/24 10:18 PM   Specimen: Nasal Swab  Result Value Ref Range Status   MRSA by PCR Next Gen NOT DETECTED NOT DETECTED Final    Comment:  (NOTE) The GeneXpert MRSA Assay (FDA approved for NASAL specimens only), is one component of a comprehensive MRSA colonization surveillance program. It is not intended to diagnose MRSA infection nor to guide or monitor treatment for MRSA infections. Test performance is not FDA approved in patients less than 29 years old. Performed at [facility], [address], [place], [place] [address]   Culture, blood (Routine X 2) w Reflex to ID Panel     Status: None   Collection Time: 07/02/24 11:09 PM   Specimen: BLOOD  Result Value Ref Range Status   Specimen Description   Final    BLOOD LEFT ANTECUBITAL Performed at [facility], [address]  [place], [place] [address]    Special Requests   Final    Blood Culture adequate volume BOTTLES DRAWN AEROBIC AND ANAEROBIC Performed at [facility], [address], [place], [place] [address]    Culture   Final    NO GROWTH 5 DAYS Performed at [facility], [address], [place], [place] [address]    Report Status 07/08/2024 FINAL  Final  Culture, blood (Routine X 2) w Reflex to ID Panel     Status: None   Collection Time: 07/02/24 11:09 PM   Specimen: BLOOD LEFT HAND  Result Value Ref Range Status   Specimen Description   Final    BLOOD LEFT HAND Performed at [facility], [address], [place], [place] [address]    Special Requests   Final    Blood Culture adequate volume BOTTLES DRAWN AEROBIC ONLY Performed at [facility], [address], [place], [place] [address]    Culture   Final    NO GROWTH 5 DAYS Performed at [facility], [address], [place], [place] [address]    Report Status 07/08/2024 FINAL  Final  Expectorated Sputum Assessment w Gram Stain, Rflx to Resp Cult     Status: None   Collection Time: 07/06/24  2:18 PM   Specimen: Expectorated Sputum  Result Value Ref Range Status   Specimen Description EXPECTORATED SPUTUM   Final   Special Requests NONE  Final   Sputum evaluation   Final    THIS SPECIMEN IS ACCEPTABLE FOR SPUTUM CULTURE Performed at [facility], [address] [place], [place] [address]    Report Status 07/06/2024 FINAL  Final  Culture, Respiratory w Gram Stain     Status: None   Collection Time: 07/06/24  2:18 PM  Result Value Ref Range Status   Specimen Description   Final    EXPECTORATED SPUTUM Performed at [facility], [address] [place], [place] [address]    Special Requests   Final    NONE Reflexed from [id number] Performed at [facility], [address], [place], [place] [address]    Gram Stain NO WBC SEEN RARE GRAM NEGATIVE RODS   Final   Culture   Final    FEW Normal respiratory flora-no Staph aureus or Pseudomonas seen Performed at [facility], [address], [place], [place] [address]    Report Status 07/09/2024 FINAL  Final     Radiology Studies: No results found.   Scheduled Meds:  Chlorhexidine  Gluconate Cloth  6 each Topical Daily   chlorpheniramine-HYDROcodone   5 mL Oral Q12H   DULoxetine   30 mg Oral Daily   gabapentin   400 mg Oral TID   guaiFENesin   1,200 mg Oral BID   methylPREDNISolone  (SOLU-MEDROL ) injection  125 mg Intravenous Daily   morphine   5 mg Oral Q4H   polyethylene glycol  17 g Oral Daily   senna-docusate  1 tablet Oral BID   sodium chloride  flush  3 mL Intravenous Q12H   traZODone   100 mg Oral QHS   Continuous Infusions:     LOS: 8 days   Ivonne Mustache, MD [facility] P9/25/2025, 11:15 AM

## 2024-07-10 NOTE — Progress Notes (Signed)
 Mobility Specialist - Progress Note   07/10/24 1052  Mobility  Activity Pivoted/transferred to/from [facility]  Level of Assistance Minimal assist, patient does 75% or more  Assistive Device None  Range of Motion/Exercises Active  Activity Response Tolerated well  Mobility visit 1 Mobility  Mobility Specialist Start Time (ACUTE ONLY) 1030  Mobility Specialist Stop Time (ACUTE ONLY) 1052  Mobility Specialist Time Calculation (min) (ACUTE ONLY) 22 min   NT requested assistance to mobilize pt. Assisted with transfer for wash up. At EOS was left in bed with all needs met. Call bell in reach and husband in room.   Erminio Leos,  Mobility Specialist Can be reached via Secure Chat

## 2024-07-10 NOTE — TOC Progression Note (Signed)
 Transition of Care [facility]) - Progression Note    Patient Details  Name: Diane [name] MRN: [id number] Date of Birth: 01/26/1956  [facility] CM/SW Contact  Tawni CHRISTELLA Eva, LCSW Phone Number: 07/10/2024, 8:25 AM  Clinical Narrative:     Pt was not accepted to [facility] due to a high oxygen  requirement. According to palliative care, [facility] and [facility] are unable to accommodate the pt's high oxygen  needs as well. Care management to follow.     Barriers to Discharge: Continued Medical Work up               Expected Discharge Plan and Services         Expected Discharge Date: 07/09/24                   Date DME Agency Contacted: 07/04/24 Time DME Agency Contacted: 1455 Representative spoke with at DME Agency: Zack             Social Drivers of Health (SDOH) Interventions SDOH Screenings   Food Insecurity: Patient Declined (07/03/2024)  Housing: Patient Declined (07/03/2024)  Transportation Needs: No Transportation Needs (07/03/2024)  Utilities: Not At Risk (07/03/2024)  Alcohol Screen: Low Risk  (04/12/2018)  Depression (PHQ2-9): Low Risk  (06/25/2024)  Social Connections: Moderately Isolated (07/03/2024)  Tobacco Use: Medium Risk (07/02/2024)    Readmission Risk Interventions     No data to display

## 2024-07-10 NOTE — Progress Notes (Signed)
 I met with Diane [name] and her husband to answer questions they had about some documents they needed notarized.  The documents were a transfer of car deed from [place] to her husband, as well as a last will and testament.  I explained that our notaries are only authorized to assist with healthcare related documents, but let them know they could contact a traveling notary who could come to the hospital. They were grateful for the information and her husband was already busy looking into a traveling notary. I assessed for other support needs.  They have someone from their community coming for spiritual support tomorrow and do not anticipate having other needs, but are aware of our ongoing availability if needed.

## 2024-07-10 NOTE — Care Management Important Message (Signed)
 Important Message  Patient Details No IM Letter given due to Comfort Care Name: Diane [name] MRN: [id number] Date of Birth: [birth year]   Important Message Given:  No     [name] 07/10/2024, 12:23 PM

## 2024-07-11 DIAGNOSIS — R0602 Shortness of breath: Secondary | ICD-10-CM

## 2024-07-11 DIAGNOSIS — Z711 Person with feared health complaint in whom no diagnosis is made: Secondary | ICD-10-CM

## 2024-07-11 DIAGNOSIS — Z79899 Other long term (current) drug therapy: Secondary | ICD-10-CM

## 2024-07-11 DIAGNOSIS — Z7189 Other specified counseling: Secondary | ICD-10-CM

## 2024-07-11 DIAGNOSIS — J189 Pneumonia, unspecified organism: Secondary | ICD-10-CM | POA: Diagnosis not present

## 2024-07-11 DIAGNOSIS — J8410 Pulmonary fibrosis, unspecified: Secondary | ICD-10-CM

## 2024-07-11 DIAGNOSIS — J9621 Acute and chronic respiratory failure with hypoxia: Secondary | ICD-10-CM

## 2024-07-11 DIAGNOSIS — Z515 Encounter for palliative care: Secondary | ICD-10-CM

## 2024-07-11 DIAGNOSIS — C3490 Malignant neoplasm of unspecified part of unspecified bronchus or lung: Secondary | ICD-10-CM

## 2024-07-11 DIAGNOSIS — Z66 Do not resuscitate: Secondary | ICD-10-CM

## 2024-07-11 MED ORDER — MORPHINE SULFATE 10 MG/5ML PO SOLN
7.5000 mg | ORAL | Status: DC
  Administered 2024-07-11 – 2024-07-13 (×11): 7.5 mg via ORAL
  Filled 2024-07-11 (×11): qty 5

## 2024-07-11 MED ORDER — SODIUM CHLORIDE 0.9% FLUSH: 10.0000 mL | INTRAVENOUS | Status: DC | PRN

## 2024-07-11 NOTE — Progress Notes (Addendum)
 PROGRESS NOTE  Diane [name]  FMW:[id number] DOB: 1955/11/14 DOA: 07/02/2024 PCP: [name], MD   Brief Narrative: Patient is a 68 year old female with history of COPD, pulmonary fibrosis, non-small cell lung cancer on Keytruda , chronic hypoxic respiratory failure on 8 L of oxygen  at home at baseline. She was sent from oncology office for the evaluation of weakness, cough, right-sided pleuritic chest pain. No history of fever but reported to have chills on presentation, she was hemodynamically stable, requiring her baseline oxygen . Lab work did not show any leukocytosis. CT chest was negative for PE but showed right perihilar masslike opacity concerning for consolidation. Patient started on broad-spectrum antibiotics.  She was managed for multifocal pneumonia.  Prolonged hospitalization without any clinical improvement.  PCCM was also consulted.  Antibiotics broadened, started on IV steroids but her dyspnea did not improve.  She continued to require more oxygen  to maintain her saturation.  Pallaitive care consulted.  After extensive goals of care discussion, she opted for residential hospice.  Care transitioned to comfort.  Plan was to transfer her to residential hospice but it was not possible due to requirement of high flow oxygen .  Currently on 50 L/min.  Palliative care following  Assessment & Plan:  Community acquired  pneumonia:  Presented with cough, weakness, right-sided pleuritic chest pain.  Reported chills at home.  No leukocytosis on presentation. right perihilar masslike opacity concerning for consolidation.  Patient was started on broad-spectrum antibiotics.  Blood cultures remain negative. Due to her chronic lung comorbidities, we consulted PCCM .   Started on Solu-Medrol .  Antibiotics broadened to cefepime .  Respiratory culture showing few gram-negative rods.  She has completed course of antibiotics.  Acute on chronic respiratory failure with hypoxia/pulmonary fibrosis   Chronically on high flow  8 L of oxygen  at home.  She was on pirfenidone  for pulmonary fibrosis.  Follows with Dr. Bubba.  Worsening respiratory status due to pneumonia as well.  This is in the setting of her lung cancer. Patient currently on heated high flow oxygen  at 50 L/min at 60% FiO2.  Respiratory status is tenuous but stable. Patient remains on Solu-Medrol .  Non-small cell lung cancer:  She was on Keytruda .  Follows with oncology.  Past smoker  Neuropathic pain:  She was taking  Cymbalta , Neurontin   Normocytic anemia:  Likely anemia of chronic disease associated with malignancy  Constipation:  Continue bowel regimen.    Goals of care: CODE STATUS is DNR.  Patient was previously under hospice services but recently stopped following with hospice because she was started on Keytruda .   Palliative care was consulted.  After goals of care discussion, she has been transitioned to comfort care.  Plan was for transfer to Residential hospice but due to requirement of high flow oxygen , she could not be transferred.  Plan is to continue in-house hospice.  Patient is waiting on family members to come into town and meet her.  Once all family member has met with the patient patient plans to do a terminal wean.  This will be facilitated by palliative care.   Patient is currently on scheduled oral morphine .  Also on as needed alprazolam /lorazepam /parenteral morphine .  DVT prophylaxis:None CODE STATUS: DNR.  Comfort care Family Communication: Discussed with the patient Disposition: Anticipate in-hospital death   Consultants: PCCM, palliative care  Procedures:None   Subjective: Patient sitting up on the bed feeling comfortable.  Denies any chest pain nausea vomiting.  Shortness of breath is better.    Objective: Vitals:   07/10/24  2013 07/10/24 2040 07/11/24 0038 07/11/24 0803  BP:  113/61    Pulse:  87    Resp: 20 18    Temp:  98.2 F (36.8 C)    TempSrc:  Oral    SpO2:  98% 95%  92%  Weight:      Height:        Intake/Output Summary (Last 24 hours) at 07/11/2024 0957 Last data filed at 07/11/2024 0300 Gross per 24 hour  Intake 1220 ml  Output --  Net 1220 ml    Filed Weights   07/02/24 2225  Weight: 67.2 kg    Examination:  General appearance: Awake alert.  In no distress Resp: Mildly tachypneic.  No use of accessory muscles noted. Cardio: S1-S2 is normal regular.  No S3-S4.  No rubs murmurs or bruit GI: Abdomen is soft.  Nontender nondistended.  Bowel sounds are present normal.  No masses organomegaly    Data Reviewed:   CBC: Recent Labs  Lab 07/06/24 0454 07/09/24 0423  WBC 7.8 9.8  HGB 8.5* 8.0*  HCT 28.6* 25.9*  MCV 94.4 94.5  PLT 369 417*   Basic Metabolic Panel: Recent Labs  Lab 07/06/24 0454 07/09/24 0423  NA 135 141  K 5.0 4.1  CL 97* 103  CO2 27 27  GLUCOSE 97 104*  BUN 15 21  CREATININE 0.64 0.58  CALCIUM  9.3 9.1     Recent Results (from the past 240 hours)  Resp panel by RT-PCR (RSV, Flu A&B, Covid) Anterior Nasal Swab     Status: None   Collection Time: 07/02/24  4:17 PM   Specimen: Anterior Nasal Swab  Result Value Ref Range Status   SARS Coronavirus 2 by RT PCR NEGATIVE NEGATIVE Final    Comment: (NOTE) SARS-CoV-2 target nucleic acids are NOT DETECTED.  The SARS-CoV-2 RNA is generally detectable in upper respiratory specimens during the acute phase of infection. The lowest concentration of SARS-CoV-2 viral copies this assay can detect is 138 copies/mL. A negative result does not preclude SARS-Cov-2 infection and should not be used as the sole basis for treatment or other patient management decisions. A negative result may occur with  improper specimen collection/handling, submission of specimen other than nasopharyngeal swab, presence of viral mutation(s) within the areas targeted by this assay, and inadequate number of viral copies(<138 copies/mL). A negative result must be combined with clinical  observations, patient history, and epidemiological information. The expected result is Negative.  Fact Sheet for Patients:  BloggerCourse.com  Fact Sheet for Healthcare Providers:  SeriousBroker.it  This test is no t yet approved or cleared by the [place]  FDA and  has been authorized for detection and/or diagnosis of SARS-CoV-2 by FDA under an Emergency Use Authorization (EUA). This EUA will remain  in effect (meaning this test can be used) for the duration of the COVID-19 declaration under Section 564(b)(1) of the Act, 21 U.S.C.section 360bbb-3(b)(1), unless the authorization is terminated  or revoked sooner.       Influenza A by PCR NEGATIVE NEGATIVE Final   Influenza B by PCR NEGATIVE NEGATIVE Final    Comment: (NOTE) The Xpert Xpress SARS-CoV-2/FLU/RSV plus assay is intended as an aid in the diagnosis of influenza from Nasopharyngeal swab specimens and should not be used as a sole basis for treatment. Nasal washings and aspirates are unacceptable for Xpert Xpress SARS-CoV-2/FLU/RSV testing.  Fact Sheet for Patients: BloggerCourse.com  Fact Sheet for Healthcare Providers: SeriousBroker.it  This test is not yet approved or cleared by  the [place]  FDA and has been authorized for detection and/or diagnosis of SARS-CoV-2 by FDA under an Emergency Use Authorization (EUA). This EUA will remain in effect (meaning this test can be used) for the duration of the COVID-19 declaration under Section 564(b)(1) of the Act, 21 U.S.C. section 360bbb-3(b)(1), unless the authorization is terminated or revoked.     Resp Syncytial Virus by PCR NEGATIVE NEGATIVE Final    Comment: (NOTE) Fact Sheet for Patients: BloggerCourse.com  Fact Sheet for Healthcare Providers: SeriousBroker.it  This test is not yet approved or cleared by  the [place]  FDA and has been authorized for detection and/or diagnosis of SARS-CoV-2 by FDA under an Emergency Use Authorization (EUA). This EUA will remain in effect (meaning this test can be used) for the duration of the COVID-19 declaration under Section 564(b)(1) of the Act, 21 U.S.C. section 360bbb-3(b)(1), unless the authorization is terminated or revoked.  Performed at [facility], [address], [place], [place] [address]   MRSA Next Gen by PCR, Nasal     Status: None   Collection Time: 07/02/24 10:18 PM   Specimen: Nasal Swab  Result Value Ref Range Status   MRSA by PCR Next Gen NOT DETECTED NOT DETECTED Final    Comment: (NOTE) The GeneXpert MRSA Assay (FDA approved for NASAL specimens only), is one component of a comprehensive MRSA colonization surveillance program. It is not intended to diagnose MRSA infection nor to guide or monitor treatment for MRSA infections. Test performance is not FDA approved in patients less than 62 years old. Performed at [facility], [address] [place], [place] [address]   Culture, blood (Routine X 2) w Reflex to ID Panel     Status: None   Collection Time: 07/02/24 11:09 PM   Specimen: BLOOD  Result Value Ref Range Status   Specimen Description   Final    BLOOD LEFT ANTECUBITAL Performed at [facility] [address], [place], [place] [address]    Special Requests   Final    Blood Culture adequate volume BOTTLES DRAWN AEROBIC AND ANAEROBIC Performed at [facility], [address] [place], [place] [address]    Culture   Final    NO GROWTH 5 DAYS Performed at [facility], [address], [place], [place] [address]    Report Status 07/08/2024 FINAL  Final  Culture, blood (Routine X 2) w Reflex to ID Panel     Status: None   Collection Time: 07/02/24 11:09 PM   Specimen: BLOOD LEFT HAND  Result Value Ref Range Status   Specimen  Description   Final    BLOOD LEFT HAND Performed at [facility], [address] [place], [place] [address]    Special Requests   Final    Blood Culture adequate volume BOTTLES DRAWN AEROBIC ONLY Performed at [facility], [address], [place], [place] [address]    Culture   Final    NO GROWTH 5 DAYS Performed at [facility], [address], [place], [place] [address]    Report Status 07/08/2024 FINAL  Final  Expectorated Sputum Assessment w Gram Stain, Rflx to Resp Cult     Status: None   Collection Time: 07/06/24  2:18 PM   Specimen: Expectorated Sputum  Result Value Ref Range Status   Specimen Description EXPECTORATED SPUTUM  Final   Special Requests NONE  Final   Sputum evaluation  Final    THIS SPECIMEN IS ACCEPTABLE FOR SPUTUM CULTURE Performed at [facility], [address] [place], [place] [address]    Report Status 07/06/2024 FINAL  Final  Culture, Respiratory w Gram Stain     Status: None   Collection Time: 07/06/24  2:18 PM  Result Value Ref Range Status   Specimen Description   Final    EXPECTORATED SPUTUM Performed at [facility], [address] [place], [place] [address]    Special Requests   Final    NONE Reflexed from [id number] Performed at [facility], [address], [place], [place] [address]    Gram Stain NO WBC SEEN RARE GRAM NEGATIVE RODS   Final   Culture   Final    FEW Normal respiratory flora-no Staph aureus or Pseudomonas seen Performed at [facility], [address], [place], [place] [address]    Report Status 07/09/2024 FINAL  Final     Radiology Studies: No results found.   Scheduled Meds:  Chlorhexidine  Gluconate Cloth  6 each Topical Daily   chlorpheniramine-HYDROcodone   5 mL Oral Q12H   DULoxetine   30 mg Oral Daily   gabapentin   400 mg Oral TID   guaiFENesin   1,200 mg Oral BID   methylPREDNISolone  (SOLU-MEDROL ) injection  125 mg  Intravenous Daily   morphine   5 mg Oral Q4H   polyethylene glycol  17 g Oral Daily   senna-docusate  1 tablet Oral BID   sodium chloride  flush  3 mL Intravenous Q12H   traZODone   100 mg Oral QHS   Continuous Infusions:     LOS: 9 days   Joette Pebbles, MD [facility] P9/26/2025, 9:57 AM

## 2024-07-11 NOTE — Plan of Care (Signed)
  Problem: Health Behavior/Discharge Planning: Goal: Ability to manage health-related needs will improve Outcome: Progressing   Problem: Clinical Measurements: Goal: Ability to maintain clinical measurements within normal limits will improve Outcome: Progressing Goal: Will remain free from infection Outcome: Progressing Goal: Diagnostic test results will improve Outcome: Progressing Goal: Cardiovascular complication will be avoided Outcome: Progressing   Problem: Nutrition: Goal: Adequate nutrition will be maintained Outcome: Progressing   Problem: Coping: Goal: Level of anxiety will decrease Outcome: Progressing   Problem: Elimination: Goal: Will not experience complications related to bowel motility Outcome: Progressing Goal: Will not experience complications related to urinary retention Outcome: Progressing   Problem: Pain Managment: Goal: General experience of comfort will improve and/or be controlled Outcome: Progressing   Problem: Safety: Goal: Ability to remain free from injury will improve Outcome: Progressing   Problem: Skin Integrity: Goal: Risk for impaired skin integrity will decrease Outcome: Progressing

## 2024-07-11 NOTE — Progress Notes (Signed)

## 2024-07-11 NOTE — Progress Notes (Signed)
 Daily Progress Note   Patient Name: Diane [name]       Date: 07/11/2024 DOB: June 10, 1956  Age: 68 y.o. MRN#: [id number] Attending Physician: Verdene Purchase, MD Primary Care Physician: [name], MD Admit Date: 07/02/2024 Length of Stay: 9 days  Reason for Consultation/Follow-up: Establishing goals of care and symptom management  Subjective:   CC: Patient notes continued shortness of breath and cough.  Following up regarding complex medical decision making and symptom management.  Subjective:  Reviewed EMR including recent documentation from hospitalist.  Patient has been receiving morphine  solution 5 mg scheduled every 4 hours.  Presented to bedside to see patient.  Patient laying in bed on HHFNC.  Patient's husband present at bedside.  Able to introduce myself and a member of the palliative medicine team.  Followed up on patient's symptom burden for today.  Patient noted her shortness of breath and cough are not controlled.  Discussed slightly increasing patient's scheduled morphine  solution.  Also discussed likelihood of patient needing IV access to get IV morphine  if needed if suddenly had worsening of breathing.  Patient agreeing to have or permanent IV access obtained at this time.  Notably discussed with hospitalist.  Discussed plan for care moving forward.  Patient noted family members coming in over the weekend.  Discussed planning for terminal high flow wean.  Patient planning to visit with family real weekend.  Discussed likely use of propofol  to assist with terminal high flow wean as patient's priority is to be comfortable at the end of life.  Noted patient would require transfer to stepdown unit to accomplish this.  Patient agreeing with this plan.  Discussed timing and plan would be for patient to go down to stepdown unit on Monday.  At this time planning for terminal high flow wean either Monday afternoon or Tuesday.  All questions answered at that time.  Noted  palliative medicine to continue following patient's medical journey.  Discussed care with hospitalist, RN, and ICU charge RN to coordinate care.  Objective:   Vital Signs:  BP 113/61 (BP Location: Right Arm)   Pulse 87   Temp 98.2 F (36.8 C) (Oral)   Resp 18   Ht 5' 5 (1.651 m)   Wt 67.2 kg   SpO2 95%   BMI 24.65 kg/m   Physical Exam: General: NAD, alert, chronically ill-appearing Cardiovascular: RRR Respiratory: no increased work of breathing noted, on HHFNC 50 L/min at 60% Neuro: A&Ox4, following commands easily Psych: appropriately answers all questions  Assessment & Plan:   Assessment: Patient is a 68 year old female with past medical history of COPD, pulmonary fibrosis, chronic respiratory failure on 8L oxygen  at home, initial diagnosis unresectable stage IIB non-small cell lung cancer in RLL with R hilar adenopathy (March 2023) s/p chemoradiation with carbo/paclitaxel  with progression with R upper lobe mass, L lung with subpleural nodule, bilateral hilar adenopathy, cervical lymph nodes stared on Keytruda  that was started on 06/25/24- notably she was on hospice from June 2025 to September 2025 when she revoked hospice to start Keytruda . She was admitted on 07/02/2024 from the cancer center for R sided pleuritic chest pain, cough, weakness, shortness of breath. She is being treated for CAP, there are also concerns for cancer progression and for immunotherapy induced pneumonitis. Palliative medicine consulted for goals of care.   Recommendations/Plan: # Complex medical decision making/goals of care:  - Discussed care with patient while husband at bedside as detailed above in HPI.  Patient continues to receive comfort focused care in  the hospital.  Awaiting further family members to visit over the weekend and then plan for terminal high flow wean next week.  Discussed with patient would plan to transfer her to stepdown unit for terminal high flow wean on Monday as would likely use  propofol  to assist with symptom management at end-of-life.  Currently planning for terminal high flow wean either Monday afternoon or Tuesday.  Palliative medicine team continuing to follow with patient's journey.  -  Code Status: Do not attempt resuscitation (DNR) - Comfort care  # Symptom management Patient is receiving these palliative interventions for symptom management with an intent to improve quality of life.     -Pain/Dyspnea, in the setting of comfort focused care with underlying pulmonary fibrosis and non-small cell lung cancer                               -Increase oral morphine  solution to 7.5 mg every 4 hours scheduled   -Continue oral morphine  solution 5 mg every 2 hours as needed   -Continue IV morphine  2 mg every 15 minute as needed Discussed with hospitalist who is working to obtain IV access.                  -Anxiety    - Continue alprazolam  1 mg 3 times daily as needed  # Discharge Planning: Anticipated Hospital Death  Discussed with: Patient, patient's husband, hospitalist, RN, ICU charge RN  Thank you for allowing the palliative care team to participate in the care Enis Oshima.  Tinnie Radar, DO Palliative Care Provider PMT # [phone]  If patient remains symptomatic despite maximum doses, please call PMT at [phone] between 0700 and 1900. Outside of these hours, please call attending, as PMT does not have night coverage.  Personally spent 50 minutes in patient care including extensive chart review (labs, imaging, progress/consult notes, vital signs), medically appropraite exam, discussed with treatment team, education to patient, family, and staff, documenting clinical information, medication review and management, coordination of care, and available advanced directive documents.

## 2024-07-12 DIAGNOSIS — Z515 Encounter for palliative care: Secondary | ICD-10-CM | POA: Diagnosis not present

## 2024-07-12 DIAGNOSIS — J189 Pneumonia, unspecified organism: Secondary | ICD-10-CM | POA: Diagnosis not present

## 2024-07-12 DIAGNOSIS — J8410 Pulmonary fibrosis, unspecified: Secondary | ICD-10-CM | POA: Diagnosis not present

## 2024-07-12 DIAGNOSIS — Z79899 Other long term (current) drug therapy: Secondary | ICD-10-CM | POA: Diagnosis not present

## 2024-07-12 NOTE — Plan of Care (Signed)
  Problem: Health Behavior/Discharge Planning: Goal: Ability to manage health-related needs will improve Outcome: Progressing   Problem: Clinical Measurements: Goal: Ability to maintain clinical measurements within normal limits will improve Outcome: Progressing Goal: Will remain free from infection Outcome: Progressing Goal: Diagnostic test results will improve Outcome: Progressing Goal: Cardiovascular complication will be avoided Outcome: Progressing   Problem: Nutrition: Goal: Adequate nutrition will be maintained Outcome: Progressing   Problem: Coping: Goal: Level of anxiety will decrease Outcome: Progressing   Problem: Elimination: Goal: Will not experience complications related to bowel motility Outcome: Progressing Goal: Will not experience complications related to urinary retention Outcome: Progressing   Problem: Pain Managment: Goal: General experience of comfort will improve and/or be controlled Outcome: Progressing   Problem: Safety: Goal: Ability to remain free from injury will improve Outcome: Progressing   Problem: Skin Integrity: Goal: Risk for impaired skin integrity will decrease Outcome: Progressing

## 2024-07-12 NOTE — Progress Notes (Signed)
 Daily Progress Note   Patient Name: Diane [name]       Date: 07/12/2024 DOB: 1956-10-03  Age: 68 y.o. MRN#: [id number] Attending Physician: Diane Purchase, MD Primary Care Physician: Diane Leta NOVAK, MD Admit Date: 07/02/2024 Length of Stay: 10 days  Reason for Consultation/Follow-up: Establishing goals of care and symptom management  Subjective:   CC: Patient notes breathing and pain improved with medication adjustments.  Following up regarding complex medical decision making and symptom management.  Subjective:  Reviewed EMR including recent documentation from hospitalist.  Patient continues to receive oral morphine  solution 7.5 mg every 4 hours scheduled.  Patient remains HHFNC at 50 L/min at FiO2 60%.  Presented to bedside to see patient.  Multiple family members at bedside.  Able to introduce myself as a member of the palliative medicine team.  Discussed patient's symptom management for today.  Patient feels that the increase in her scheduled morphine  solution has improved her cough and breathing.  Does not feel further adjustments need to be made at this time.  Patient did have midline placed in preparation for IV medications as needed.  All questions answered at that time.  Note bowel medicine team continue to follow along with patient's medical journey.  Objective:   Vital Signs:  BP (!) 112/59 (BP Location: Right Arm)   Pulse 88   Temp 99.9 F (37.7 C) (Oral)   Resp 18   Ht 5' 5 (1.651 m)   Wt 67.2 kg   SpO2 (!) 89%   BMI 24.65 kg/m   Physical Exam: General: NAD, alert, chronically ill-appearing Cardiovascular: RRR Respiratory: no increased work of breathing noted, on HHFNC 50 L/min at 60% Neuro: A&Ox4, following commands easily Psych: appropriately answers all questions  Assessment & Plan:   Assessment: Patient is a 68 year old female with past medical history of COPD, pulmonary fibrosis, chronic respiratory failure on 8L oxygen  at home, initial diagnosis  unresectable stage IIB non-small cell lung cancer in RLL with R hilar adenopathy (March 2023) s/p chemoradiation with carbo/paclitaxel  with progression with R upper lobe mass, L lung with subpleural nodule, bilateral hilar adenopathy, cervical lymph nodes stared on Keytruda  that was started on 06/25/24- notably she was on hospice from June 2025 to September 2025 when she revoked hospice to start Keytruda . She was admitted on 07/02/2024 from the cancer center for R sided pleuritic chest pain, cough, weakness, shortness of breath. She is being treated for CAP, there are also concerns for cancer progression and for immunotherapy induced pneumonitis. Palliative medicine consulted for goals of care.   Recommendations/Plan: # Complex medical decision making/goals of care:  - Discussed care with patient while husband at bedside as detailed above in HPI.  Patient continues to receive comfort focused care in the hospital.  Having further family members visit over the weekend and then plan for terminal high flow wean next week.  Discussed with patient and husband that the would plan to transfer her to stepdown unit for terminal high flow wean on Monday as would likely use propofol  to assist with symptom management at end-of-life.  Currently planning for terminal high flow wean either Monday afternoon or Tuesday.  Palliative medicine team continuing to follow with patient's journey.  -  Code Status: Do not attempt resuscitation (DNR) - Comfort care  # Symptom management Patient is receiving these palliative interventions for symptom management with an intent to improve quality of life.     -Pain/Dyspnea, in the setting of comfort focused care with underlying pulmonary fibrosis and  non-small cell lung cancer                               - Continue oral morphine  solution to 7.5 mg every 4 hours scheduled   -Continue oral morphine  solution 5 mg every 2 hours as needed   -Continue IV morphine  2 mg every 15 minute as  needed Discussed with hospitalist who is working to obtain IV access.                  -Anxiety    - Continue alprazolam  1 mg 3 times daily as needed  # Discharge Planning: Anticipated Hospital Death  Discussed with: Patient, patient's husband, patient's family at bedside  Thank you for allowing the palliative care team to participate in the care Diane [name].  Diane Radar, DO Palliative Care Provider PMT # [phone]  If patient remains symptomatic despite maximum doses, please call PMT at [phone] between 0700 and 1900. Outside of these hours, please call attending, as PMT does not have night coverage.

## 2024-07-12 NOTE — Progress Notes (Signed)
 PROGRESS NOTE  Diane [name]  FMW:[id number] DOB: Oct 17, 1955 DOA: 07/02/2024 PCP: [name], MD   Brief Narrative: Patient is a 68 year old female with history of COPD, pulmonary fibrosis, non-small cell lung cancer on Keytruda , chronic hypoxic respiratory failure on 8 L of oxygen  at home at baseline. She was sent from oncology office for the evaluation of weakness, cough, right-sided pleuritic chest pain. No history of fever but reported to have chills on presentation, she was hemodynamically stable, requiring her baseline oxygen . Lab work did not show any leukocytosis. CT chest was negative for PE but showed right perihilar masslike opacity concerning for consolidation. Patient started on broad-spectrum antibiotics.  She was managed for multifocal pneumonia.  Prolonged hospitalization without any clinical improvement.  PCCM was also consulted.  Antibiotics broadened, started on IV steroids but her dyspnea did not improve.  She continued to require more oxygen  to maintain her saturation.  Pallaitive care consulted.  After extensive goals of care discussion, she opted for residential hospice.  Care transitioned to comfort.  Plan was to transfer her to residential hospice but it was not possible due to requirement of high flow oxygen .  Currently on 50 L/min.  Palliative care following  Assessment & Plan:  Community acquired  pneumonia:  Presented with cough, weakness, right-sided pleuritic chest pain.  Reported chills at home.  No leukocytosis on presentation. right perihilar masslike opacity concerning for consolidation.  Patient was started on broad-spectrum antibiotics.  Blood cultures remain negative. Due to her chronic lung comorbidities, PCCM was consulted.  Patient was started on steroids.  Antibiotics were broadened to cefepime .  Respiratory cultures showing few gram-negative rods.  But final cultures showed normal flora. Patient has completed course of antibiotics and steroids.    Acute on chronic respiratory failure with hypoxia/pulmonary fibrosis  Chronically on high flow  8 L of oxygen  at home.  She was on pirfenidone  for pulmonary fibrosis.  Follows with Dr. Bubba.  Worsening respiratory status due to pneumonia as well.  This is in the setting of her lung cancer. Patient currently on heated high flow oxygen  at 50 L/min at 60% FiO2.  Respiratory status is tenuous but stable.  Non-small cell lung cancer:  She was on Keytruda .  Follows with oncology.  Past smoker  Neuropathic pain:  She was taking  Cymbalta , Neurontin   Normocytic anemia:  Likely anemia of chronic disease associated with malignancy  Constipation:  Continue bowel regimen.    Goals of care: CODE STATUS is DNR.  Patient was previously under hospice services but recently stopped following with hospice because she was started on Keytruda .   Palliative care was consulted.  After goals of care discussion, she has been transitioned to comfort care.  Plan was for transfer to Residential hospice but due to requirement of high flow oxygen , she could not be transferred.  Plan is to continue in-house hospice.  Patient is waiting on family members to come into town and meet her.  Once all family member has met with the patient patient plans to do a terminal wean.  This will be facilitated by palliative care.   Patient is currently on scheduled oral morphine .  Also on as needed alprazolam /lorazepam /parenteral morphine .  DVT prophylaxis:None CODE STATUS: DNR.  Comfort care Family Communication: Discussed with the patient Disposition: Anticipate in-hospital death   Consultants: PCCM, palliative care  Procedures:None   Subjective: Patient sitting up on the bed feeling comfortable.  Her family has come into town and she is waiting for them to come  to the hospital this morning.  Nuys any complaints.  No questions.    Objective: Vitals:   07/11/24 1900 07/12/24 0101 07/12/24 0522 07/12/24 0744  BP:    (!) 112/59   Pulse:   88 88  Resp:    18  Temp:   99.9 F (37.7 C)   TempSrc:   Oral   SpO2: 98% 98% 93% (!) 89%  Weight:      Height:        Intake/Output Summary (Last 24 hours) at 07/12/2024 0856 Last data filed at 07/12/2024 0500 Gross per 24 hour  Intake 200 ml  Output --  Net 200 ml    Filed Weights   07/02/24 2225  Weight: 67.2 kg    Examination:  Noted to be comfortable.  Mildly tachypneic.   Data Reviewed:   CBC: Recent Labs  Lab 07/06/24 0454 07/09/24 0423  WBC 7.8 9.8  HGB 8.5* 8.0*  HCT 28.6* 25.9*  MCV 94.4 94.5  PLT 369 417*   Basic Metabolic Panel: Recent Labs  Lab 07/06/24 0454 07/09/24 0423  NA 135 141  K 5.0 4.1  CL 97* 103  CO2 27 27  GLUCOSE 97 104*  BUN 15 21  CREATININE 0.64 0.58  CALCIUM  9.3 9.1     Recent Results (from the past 240 hours)  Resp panel by RT-PCR (RSV, Flu A&B, Covid) Anterior Nasal Swab     Status: None   Collection Time: 07/02/24  4:17 PM   Specimen: Anterior Nasal Swab  Result Value Ref Range Status   SARS Coronavirus 2 by RT PCR NEGATIVE NEGATIVE Final    Comment: (NOTE) SARS-CoV-2 target nucleic acids are NOT DETECTED.  The SARS-CoV-2 RNA is generally detectable in upper respiratory specimens during the acute phase of infection. The lowest concentration of SARS-CoV-2 viral copies this assay can detect is 138 copies/mL. A negative result does not preclude SARS-Cov-2 infection and should not be used as the sole basis for treatment or other patient management decisions. A negative result may occur with  improper specimen collection/handling, submission of specimen other than nasopharyngeal swab, presence of viral mutation(s) within the areas targeted by this assay, and inadequate number of viral copies(<138 copies/mL). A negative result must be combined with clinical observations, patient history, and epidemiological information. The expected result is Negative.  Fact Sheet for Patients:   BloggerCourse.com  Fact Sheet for Healthcare Providers:  SeriousBroker.it  This test is no t yet approved or cleared by the [place]  FDA and  has been authorized for detection and/or diagnosis of SARS-CoV-2 by FDA under an Emergency Use Authorization (EUA). This EUA will remain  in effect (meaning this test can be used) for the duration of the COVID-19 declaration under Section 564(b)(1) of the Act, 21 U.S.C.section 360bbb-3(b)(1), unless the authorization is terminated  or revoked sooner.       Influenza A by PCR NEGATIVE NEGATIVE Final   Influenza B by PCR NEGATIVE NEGATIVE Final    Comment: (NOTE) The Xpert Xpress SARS-CoV-2/FLU/RSV plus assay is intended as an aid in the diagnosis of influenza from Nasopharyngeal swab specimens and should not be used as a sole basis for treatment. Nasal washings and aspirates are unacceptable for Xpert Xpress SARS-CoV-2/FLU/RSV testing.  Fact Sheet for Patients: BloggerCourse.com  Fact Sheet for Healthcare Providers: SeriousBroker.it  This test is not yet approved or cleared by the [place]  FDA and has been authorized for detection and/or diagnosis of SARS-CoV-2 by FDA under an Emergency Use Authorization (  EUA). This EUA will remain in effect (meaning this test can be used) for the duration of the COVID-19 declaration under Section 564(b)(1) of the Act, 21 U.S.C. section 360bbb-3(b)(1), unless the authorization is terminated or revoked.     Resp Syncytial Virus by PCR NEGATIVE NEGATIVE Final    Comment: (NOTE) Fact Sheet for Patients: BloggerCourse.com  Fact Sheet for Healthcare Providers: SeriousBroker.it  This test is not yet approved or cleared by the [place]  FDA and has been authorized for detection and/or diagnosis of SARS-CoV-2 by FDA under an Emergency Use  Authorization (EUA). This EUA will remain in effect (meaning this test can be used) for the duration of the COVID-19 declaration under Section 564(b)(1) of the Act, 21 U.S.C. section 360bbb-3(b)(1), unless the authorization is terminated or revoked.  Performed at [facility], [address], [place], [place] [address]   MRSA Next Gen by PCR, Nasal     Status: None   Collection Time: 07/02/24 10:18 PM   Specimen: Nasal Swab  Result Value Ref Range Status   MRSA by PCR Next Gen NOT DETECTED NOT DETECTED Final    Comment: (NOTE) The GeneXpert MRSA Assay (FDA approved for NASAL specimens only), is one component of a comprehensive MRSA colonization surveillance program. It is not intended to diagnose MRSA infection nor to guide or monitor treatment for MRSA infections. Test performance is not FDA approved in patients less than 80 years old. Performed at [facility], [address] [place], [place] [address]   Culture, blood (Routine X 2) w Reflex to ID Panel     Status: None   Collection Time: 07/02/24 11:09 PM   Specimen: BLOOD  Result Value Ref Range Status   Specimen Description   Final    BLOOD LEFT ANTECUBITAL Performed at [facility], [address] [place], [place] [address]    Special Requests   Final    Blood Culture adequate volume BOTTLES DRAWN AEROBIC AND ANAEROBIC Performed at [facility], [address], [place], [place] [address]    Culture   Final    NO GROWTH 5 DAYS Performed at [facility], [address] [place], [place] [address]    Report Status 07/08/2024 FINAL  Final  Culture, blood (Routine X 2) w Reflex to ID Panel     Status: None   Collection Time: 07/02/24 11:09 PM   Specimen: BLOOD LEFT HAND  Result Value Ref Range Status   Specimen Description   Final    BLOOD LEFT HAND Performed at [facility], [address], [place], [place] [address]    Special  Requests   Final    Blood Culture adequate volume BOTTLES DRAWN AEROBIC ONLY Performed at [facility], [address] [place], [place] [address]    Culture   Final    NO GROWTH 5 DAYS Performed at [facility], [address], [place], [place] [address]    Report Status 07/08/2024 FINAL  Final  Expectorated Sputum Assessment w Gram Stain, Rflx to Resp Cult     Status: None   Collection Time: 07/06/24  2:18 PM   Specimen: Expectorated Sputum  Result Value Ref Range Status   Specimen Description EXPECTORATED SPUTUM  Final   Special Requests NONE  Final   Sputum evaluation   Final    THIS SPECIMEN IS ACCEPTABLE FOR SPUTUM CULTURE Performed at [facility], [address]  [place], [place] [address]    Report Status 07/06/2024 FINAL  Final  Culture, Respiratory w Gram Stain     Status: None   Collection Time: 07/06/24  2:18 PM  Result Value Ref Range Status   Specimen Description   Final    EXPECTORATED SPUTUM Performed at [facility], [address] [place], [place] [address]    Special Requests   Final    NONE Reflexed from [id number] Performed at [facility], [address], [place], [place] [address]    Gram Stain NO WBC SEEN RARE GRAM NEGATIVE RODS   Final   Culture   Final    FEW Normal respiratory flora-no Staph aureus or Pseudomonas seen Performed at [facility], [address] [place], [place] [address]    Report Status 07/09/2024 FINAL  Final     Radiology Studies: No results found.   Scheduled Meds:  Chlorhexidine  Gluconate Cloth  6 each Topical Daily   chlorpheniramine-HYDROcodone   5 mL Oral Q12H   DULoxetine   30 mg Oral Daily   gabapentin   400 mg Oral TID   guaiFENesin   1,200 mg Oral BID   methylPREDNISolone  (SOLU-MEDROL ) injection  125 mg Intravenous Daily   morphine   7.5 mg Oral Q4H   polyethylene glycol  17 g Oral Daily   senna-docusate  1 tablet Oral BID    sodium chloride  flush  3 mL Intravenous Q12H   traZODone   100 mg Oral QHS   Continuous Infusions:     LOS: 10 days   Joette Pebbles, MD [facility] P9/27/2025, 8:56 AM

## 2024-07-13 DIAGNOSIS — Z7189 Other specified counseling: Secondary | ICD-10-CM | POA: Diagnosis not present

## 2024-07-13 DIAGNOSIS — Z79899 Other long term (current) drug therapy: Secondary | ICD-10-CM

## 2024-07-13 DIAGNOSIS — J8410 Pulmonary fibrosis, unspecified: Secondary | ICD-10-CM | POA: Diagnosis not present

## 2024-07-13 DIAGNOSIS — J189 Pneumonia, unspecified organism: Secondary | ICD-10-CM | POA: Diagnosis not present

## 2024-07-13 DIAGNOSIS — Z66 Do not resuscitate: Secondary | ICD-10-CM | POA: Diagnosis not present

## 2024-07-13 DIAGNOSIS — Z515 Encounter for palliative care: Secondary | ICD-10-CM | POA: Diagnosis not present

## 2024-07-13 MED ORDER — BIOTENE DRY MOUTH MT LIQD: 15.0000 mL | OROMUCOSAL | Status: DC | PRN

## 2024-07-13 MED ORDER — POLYVINYL ALCOHOL 1.4 % OP SOLN: 1.0000 [drp] | Freq: Four times a day (QID) | OPHTHALMIC | Status: DC | PRN

## 2024-07-13 MED ORDER — HYDROMORPHONE HCL-NACL 50-0.9 MG/50ML-% IV SOLN
2.0000 mg/h | INTRAVENOUS | Status: DC
  Administered 2024-07-13: 4 mg/h via INTRAVENOUS
  Administered 2024-07-13: 6 mg/h via INTRAVENOUS
  Administered 2024-07-13: 2 mg/h via INTRAVENOUS
  Filled 2024-07-13 (×2): qty 50

## 2024-07-13 MED ORDER — HALOPERIDOL LACTATE 5 MG/ML IJ SOLN: 0.5000 mg | INTRAMUSCULAR | Status: DC | PRN

## 2024-07-13 MED ORDER — HYDROMORPHONE BOLUS VIA INFUSION
1.0000 mg | INTRAVENOUS | Status: DC | PRN
  Administered 2024-07-13: 2 mg via INTRAVENOUS
  Administered 2024-07-13: 3 mg via INTRAVENOUS
  Administered 2024-07-13: 2 mg via INTRAVENOUS
  Administered 2024-07-13 (×2): 2.5 mg via INTRAVENOUS

## 2024-07-13 MED ORDER — PROPOFOL 500 MG/50ML IV EMUL
5.0000 ug/kg/min | INTRAVENOUS | Status: DC
  Administered 2024-07-13: 5 ug/kg/min via INTRAVENOUS
  Filled 2024-07-13: qty 50

## 2024-07-13 MED ORDER — HALOPERIDOL LACTATE 5 MG/ML IJ SOLN
2.0000 mg | INTRAMUSCULAR | Status: DC | PRN
Start: 2024-07-13 — End: 2024-07-13

## 2024-07-13 MED ORDER — HYDROMORPHONE BOLUS VIA INFUSION
2.0000 mg | Freq: Once | INTRAVENOUS | Status: AC
  Administered 2024-07-13: 2 mg via INTRAVENOUS
  Filled 2024-07-13: qty 2

## 2024-07-13 MED ORDER — GLYCOPYRROLATE 0.2 MG/ML IJ SOLN: 0.2000 mg | INTRAMUSCULAR | Status: DC | PRN

## 2024-07-16 NOTE — Progress Notes (Signed)
 Daily Progress Note   Patient Name: Diane [name]       Date: 08/04/2024 DOB: 04-12-1956  Age: 68 y.o. MRN#: [id number] Attending Physician: Verdene Purchase, MD Primary Care Physician: [name], MD Admit Date: 07/02/2024 Length of Stay: 11 days  Reason for Consultation/Follow-up: Establishing goals of care and symptom management  Subjective:   CC: Patient having worsening breathing today.  Following up regarding complex medical decision making and symptom management.  Subjective: Reviewed EMR including recent documentation from hospitalist.  At time of EMR review on past 24 hours patient has required as needed IV morphine  2 mg x 2 doses and Xanax  1 mg x 1 dose.  When presenting to bedside to see patient, patient noticeably working harder to breathe.  Patient appears very fatigued.  Multiple family members at bedside.  Family and patient acknowledged the patient has had a very difficult morning with worsening shortness of breath.  Medications no longer assisting with management.  Discussed original plan had been to get patient down to stepdown unit tomorrow to work towards palliative high flow wean, with patient's deterioration today, would move down to stepdown unit today. Will allow comfort focused care with continuous infusion since patient has stated her priority is that she not die in pain or short of breath and this is now becoming more burdensome.  Patient and family agreeing with this plan.  Able to check on patient again when she was down in ICU.  Nurses providing medications for comfort.  Planning to remove high flow once patient is comfortable.  Multiple family members at bedside appropriately grieving.  Discussed care with hospitalist, RN, and RT during the day to coordinate care.  Objective:   Vital Signs:  BP (!) 121/57 (BP Location: Right Arm)   Pulse 92   Temp 100.3 F (37.9 C) (Oral)   Resp (!) 24   Ht 5' 5 (1.651 m)   Wt 67.2 kg   SpO2 94%   BMI 24.65  kg/m   Physical Exam: General: NAD, fatigued, ill-appearing Cardiovascular: RRR Respiratory: increased work of breathing noted, on HHFNC 50 L/min at 60% Neuro: Awake though very fatigued, can minimally interact  Assessment & Plan:   Assessment: Patient is a 68 year old female with past medical history of COPD, pulmonary fibrosis, chronic respiratory failure on 8L oxygen  at home, initial diagnosis unresectable stage IIB non-small cell lung cancer in RLL with R hilar adenopathy (March 2023) s/p chemoradiation with carbo/paclitaxel  with progression with R upper lobe mass, L lung with subpleural nodule, bilateral hilar adenopathy, cervical lymph nodes stared on Keytruda  that was started on 06/25/24- notably she was on hospice from June 2025 to September 2025 when she revoked hospice to start Keytruda . She was admitted on 07/02/2024 from the cancer center for R sided pleuritic chest pain, cough, weakness, shortness of breath. She is being treated for CAP, there are also concerns for cancer progression and for immunotherapy induced pneumonitis. Palliative medicine consulted for goals of care.   Recommendations/Plan: # Complex medical decision making/goals of care:  - Discussed care with patient while husband and multiple family members at bedside as detailed above in HPI.  Patient's medical status has deteriorated with worsening shortness of breath requiring frequent pain medications.  Discussed with patient and family plan to transfer down to stepdown unit today to allow continuous infusion of medications for symptom management at end-of-life.  Once patient is comfortable, would plan to remove HHFNC for palliative high flow wean.  Palliative medicine team continuing to follow  with patient's journey.  -  Code Status: Do not attempt resuscitation (DNR) - Comfort care  # Symptom management Patient is receiving these palliative interventions for symptom management with an intent to improve quality of life.      -Pain/Dyspnea, in the setting of comfort focused care with underlying pulmonary fibrosis and non-small cell lung cancer                               -Stop oral morphine  solution patient has a, room here   - Start IV Dilaudid  continuous infusion with bolus dosing as needed.  Continue to adjust medications based on patient's symptom burden.   - Start titratable propofol  infusion for palliative high flow wean                  -Anxiety    - Discontinue alprazolam     - Continue IV Ativan  1 mg every 4 hours as needed   - Start IV haloperidol 2 mg every 4 hours.   - Secretions   - Start IV glycopyrrolate  0.2 mg every 4 hours as needed  # Discharge Planning: Anticipated Hospital Death  Discussed with: Patient, patient's husband, patient's family at bedside, RN, ICU charge RN, hospitalist  Thank you for allowing the palliative care team to participate in the care Cystal Andreoli.  Tinnie Radar, DO Palliative Care Provider PMT # [phone]  If patient remains symptomatic despite maximum doses, please call PMT at [phone] between 0700 and 1900. Outside of these hours, please call attending, as PMT does not have night coverage.  Billing based on MDM: High  Problems Addressed: One or more chronic illnesses with severe exacerbation, progression, or side effects of treatment.  Risks: Parenteral controlled substances

## 2024-07-16 NOTE — Progress Notes (Signed)
 PROGRESS NOTE  Diane [name]  FMW:[id number] DOB: 02-18-1956 DOA: 07/02/2024 PCP: [name], MD   Brief Narrative: Patient is a 68 year old female with history of COPD, pulmonary fibrosis, non-small cell lung cancer on Keytruda , chronic hypoxic respiratory failure on 8 L of oxygen  at home at baseline. She was sent from oncology office for the evaluation of weakness, cough, right-sided pleuritic chest pain. No history of fever but reported to have chills on presentation, she was hemodynamically stable, requiring her baseline oxygen . Lab work did not show any leukocytosis. CT chest was negative for PE but showed right perihilar masslike opacity concerning for consolidation. Patient started on broad-spectrum antibiotics.  She was managed for multifocal pneumonia.  Prolonged hospitalization without any clinical improvement.  PCCM was also consulted.  Antibiotics broadened, started on IV steroids but her dyspnea did not improve.  She continued to require more oxygen  to maintain her saturation.  Pallaitive care consulted.  After extensive goals of care discussion, she opted for residential hospice.  Care transitioned to comfort.  Plan was to transfer her to residential hospice but it was not possible due to requirement of high flow oxygen .  Currently on 50 L/min.  Palliative care following  Assessment & Plan:  Community acquired  pneumonia:  Presented with cough, weakness, right-sided pleuritic chest pain.  Reported chills at home.  No leukocytosis on presentation. right perihilar masslike opacity concerning for consolidation.  Patient was started on broad-spectrum antibiotics.  Blood cultures remain negative. Due to her chronic lung comorbidities, PCCM was consulted.  Patient was started on steroids.  Antibiotics were broadened to cefepime .  Respiratory cultures showing few gram-negative rods.  But final cultures showed normal flora. Patient has completed course of antibiotics and steroids.    Acute on chronic respiratory failure with hypoxia/pulmonary fibrosis  Chronically on high flow  8 L of oxygen  at home.  She was on pirfenidone  for pulmonary fibrosis.  Follows with Dr. Bubba.  Worsening respiratory status due to pneumonia as well.  This is in the setting of her lung cancer. Patient currently on heated high flow oxygen  at 50 L/min at 60% FiO2.  Respiratory status is tenuous but stable.  Non-small cell lung cancer:  She was on Keytruda .  Follows with oncology.  Past smoker  Neuropathic pain:  She was taking  Cymbalta , Neurontin   Normocytic anemia:  Likely anemia of chronic disease associated with malignancy  Constipation:  Continue bowel regimen.    Goals of care: CODE STATUS is DNR.  Patient was previously under hospice services but recently stopped following with hospice because she was started on Keytruda .   Palliative care was consulted.  After goals of care discussion, she has been transitioned to comfort care.  Plan was for transfer to Residential hospice but due to requirement of high flow oxygen , she could not be transferred.  Plan is to continue in-house hospice.  Patient is waiting on family members to come into town and meet her.  Once all family member has met with the patient patient plans to do a terminal wean.  This will be facilitated by palliative care.   Patient is currently on scheduled oral morphine .  Also on as needed alprazolam /lorazepam /parenteral morphine .  DVT prophylaxis:None CODE STATUS: DNR.  Comfort care Family Communication: Discussed with the patient Disposition: Anticipate in-hospital death   Consultants: PCCM, palliative care  Procedures:None   Subjective: Patient lying on the bed.  Appears to be comfortable.  She denies any pain or discomfort.    Objective: Vitals:  07/12/24 1533 07/12/24 2022 07/12/24 2122 08-05-2024 0254  BP:  (!) 121/57    Pulse:  92    Resp:  (!) 24    Temp:  100.3 F (37.9 C)    TempSrc:  Oral     SpO2: (!) 87% 93% (!) 84% 94%  Weight:      Height:       No intake or output data in the 24 hours ending 2024/08/05 0956   Filed Weights   07/02/24 2225  Weight: 67.2 kg    Examination:  Patient is noted to be comfortable.   Data Reviewed:   CBC: Recent Labs  Lab 07/09/24 0423  WBC 9.8  HGB 8.0*  HCT 25.9*  MCV 94.5  PLT 417*   Basic Metabolic Panel: Recent Labs  Lab 07/09/24 0423  NA 141  K 4.1  CL 103  CO2 27  GLUCOSE 104*  BUN 21  CREATININE 0.58  CALCIUM  9.1     Recent Results (from the past 240 hours)  Expectorated Sputum Assessment w Gram Stain, Rflx to Resp Cult     Status: None   Collection Time: 07/06/24  2:18 PM   Specimen: Expectorated Sputum  Result Value Ref Range Status   Specimen Description EXPECTORATED SPUTUM  Final   Special Requests NONE  Final   Sputum evaluation   Final    THIS SPECIMEN IS ACCEPTABLE FOR SPUTUM CULTURE Performed at [facility], [address] [place], [place] [address]    Report Status 07/06/2024 FINAL  Final  Culture, Respiratory w Gram Stain     Status: None   Collection Time: 07/06/24  2:18 PM  Result Value Ref Range Status   Specimen Description   Final    EXPECTORATED SPUTUM Performed at [facility], [address], [place], [place] [address]    Special Requests   Final    NONE Reflexed from [id number] Performed at [facility], [address], [place], [place] [address]    Gram Stain NO WBC SEEN RARE GRAM NEGATIVE RODS   Final   Culture   Final    FEW Normal respiratory flora-no Staph aureus or Pseudomonas seen Performed at [facility], [address] [place], [place] [address]    Report Status 07/09/2024 FINAL  Final     Radiology Studies: No results found.   Scheduled Meds:  Chlorhexidine  Gluconate Cloth  6 each Topical Daily   chlorpheniramine-HYDROcodone   5 mL Oral Q12H   DULoxetine   30 mg Oral Daily   gabapentin   400 mg  Oral TID   guaiFENesin   1,200 mg Oral BID   morphine   7.5 mg Oral Q4H   polyethylene glycol  17 g Oral Daily   senna-docusate  1 tablet Oral BID   sodium chloride  flush  3 mL Intravenous Q12H   traZODone   100 mg Oral QHS   Continuous Infusions:     LOS: 11 days   Joette Pebbles, MD [facility] P2025/10/21, 9:56 AM

## 2024-07-16 NOTE — Death Summary Note (Incomplete)
 DEATH SUMMARY   Patient Details  Name: Genella Bas MRN: [id number] DOB: 1956-03-23 ERE:Cbjd, Leta NOVAK, MD Admission/Discharge Information   Admit Date:  07-05-2024  Date of Death: Date of Death: 07/16/2024  Time of Death: Time of Death: 1530  Length of Stay: 29-Jul-2024   Principle Cause of death: Pulmonary fibrosis  Brief Narrative: Patient is a 68 year old female with history of COPD, pulmonary fibrosis, non-small cell lung cancer on Keytruda , chronic hypoxic respiratory failure on 8 L of oxygen  at home at baseline. She was sent from oncology office for the evaluation of weakness, cough, right-sided pleuritic chest pain. No history of fever but reported to have chills on presentation, she was hemodynamically stable, requiring her baseline oxygen . Lab work did not show any leukocytosis. CT chest was negative for PE but showed right perihilar masslike opacity concerning for consolidation. Patient started on broad-spectrum antibiotics.  She was managed for multifocal pneumonia.  Prolonged hospitalization without any clinical improvement.  PCCM was also consulted.  Antibiotics broadened, started on IV steroids but her dyspnea did not improve.  She continued to require more oxygen  to maintain her saturation.  Pallaitive care consulted.  After extensive goals of care discussion, she opted for residential hospice.  Care transitioned to comfort.  Plan was to transfer her to residential hospice but it was not possible due to requirement of high flow oxygen .  Currently on 50 L/min.  Palliative care following  HOSPITAL COURSE:   Community acquired  pneumonia:  Patient was treated with antibiotics.  She was seen by pulmonology.  She was also given steroids.  Despite adequate treatment she did not improve.     Acute on chronic respiratory failure with hypoxia/pulmonary fibrosis  Chronically on high flow  8 L of oxygen  at home.  She was on pirfenidone  for pulmonary fibrosis.  Follows with Dr. Bubba.   Despite treatment of pneumonia her oxygenation continued to worsen.  Palliative care was consulted.  She was transition to comfort care.   Non-small cell lung cancer:  Neuropathic pain:  Normocytic anemia:  Constipation:    Goals of care: CODE STATUS is DNR.  Patient was previously under hospice services but recently stopped following with hospice because she was started on Keytruda .   Palliative care was consulted.  After goals of care discussion, she has been transitioned to comfort care.  Plan was for transfer to Residential hospice but due to requirement of high flow oxygen , she could not be transferred.  Terminal wean was planned after patient had an opportunity to meet her family.  She was then transferred to stepdown.  She was started on propofol  and then weaned.  She subsequently expired on 2024/07/16 at 3:30 PM.       The results of significant diagnostics from this hospitalization (including imaging, microbiology, ancillary and laboratory) are listed below for reference.   Significant Diagnostic Studies: DG CHEST PORT 1 VIEW Result Date: 07/04/2024 EXAM: 1 VIEW XRAY OF THE CHEST 07/04/2024 01:10:00 PM COMPARISON: 03/27/2024, 07/05/2024 CLINICAL HISTORY: PNA (pneumonia) [id number]. F/u exam for PNA; Hx of lung cancer, COPD, emphysema, pulmonary fibrosis, former smoker. FINDINGS: LUNGS AND PLEURA: Right lung airspace opacities, grossly stable. Stable chronic lung disease at the left lung base. Background pulmonary fibrosis. Small right pleural effusion. HEART AND MEDIASTINUM: Unchanged heart size and mediastinal contours. BONES AND SOFT TISSUES: No acute osseous abnormality. IMPRESSION: 1. Right lung opacities typical of pneumonia. Direct comparison with recent CT is difficult due to differences in modality, but grossly stable. 2. Small  right pleural effusion. 3. Chronic lung disease. Electronically signed by: Andrea Gasman MD 07/04/2024 03:10 PM EDT RP Workstation: [id number]   CT Angio  Chest PE W and/or Wo Contrast Result Date: 07/02/2024 CLINICAL DATA:  Stage IV lung cancer. Right-sided chest pain, chills, cough and fatigue. EXAM: CT ANGIOGRAPHY CHEST WITH CONTRAST TECHNIQUE: Multidetector CT imaging of the chest was performed using the standard protocol during bolus administration of intravenous contrast. Multiplanar CT image reconstructions and MIPs were obtained to evaluate the vascular anatomy. RADIATION DOSE REDUCTION: This exam was performed according to the departmental dose-optimization program which includes automated exposure control, adjustment of the mA and/or kV according to patient size and/or use of iterative reconstruction technique. CONTRAST:  75mL OMNIPAQUE  IOHEXOL  350 MG/ML SOLN COMPARISON:  PET CT 05/14/2024, chest CTA 03/27/2024 FINDINGS: Cardiovascular: There are no filling defects within the pulmonary arteries to suggest pulmonary embolus. The segmental right upper lobe pulmonary arteries are attenuated. The heart is enlarged. There are coronary artery calcifications. Aortic atherosclerosis. No acute aortic findings. Mediastinum/Nodes: 15 mm left hilar lymph node. 20 mm subcarinal lymph node. There are multiple prominent anterior paratracheal and upper mediastinal nodes. Many of these nodes have increased in size from prior PET. Patulous esophagus. Small hiatal hernia. Lungs/Pleura: Right perihilar masslike opacity has increased from prior exam, margins are poorly defined. Consolidation extends peripherally towards the apex with surrounding ground-glass, new. There is increasing consolidation within the perihilar right lower lobe. Increasing consolidation in the dependent right lower lobe. Advanced emphysema with ground-glass attenuation and fibrotic change. Right pleural thickening without significant effusion. The left lower lobe pulmonary nodule has increased, 12 mm in thickness, previously 7 mm, series 12, image 95. No pneumothorax. Upper Abdomen: Cholecystectomy.  No  acute upper abdominal findings. Musculoskeletal: No focal bone lesion or acute osseous finding. Review of the MIP images confirms the above findings. IMPRESSION: 1. No pulmonary embolus. 2. Right perihilar masslike opacity has increased from prior exam, margins are poorly defined. Consolidation extends peripherally towards the apex with surrounding ground-glass, new. There is increasing consolidation within the perihilar right lower lobe and dependent right lower. Findings may represent worsening neoplastic process, pneumonia, or combination there of. 3. Increasing size of left lower lobe pulmonary nodule, suspicious for progression of disease. 4. Increasing mediastinal and left hilar adenopathy, also suspicious for progression of disease. Aortic Atherosclerosis (ICD10-I70.0) and Emphysema (ICD10-J43.9). Electronically Signed   By: Andrea Gasman M.D.   On: 07/02/2024 20:06    Microbiology: Recent Results (from the past 240 hours)  Expectorated Sputum Assessment w Gram Stain, Rflx to Resp Cult     Status: None   Collection Time: 07/06/24  2:18 PM   Specimen: Expectorated Sputum  Result Value Ref Range Status   Specimen Description EXPECTORATED SPUTUM  Final   Special Requests NONE  Final   Sputum evaluation   Final    THIS SPECIMEN IS ACCEPTABLE FOR SPUTUM CULTURE Performed at [facility], [address], [place], [place] [address]    Report Status 07/06/2024 FINAL  Final  Culture, Respiratory w Gram Stain     Status: None   Collection Time: 07/06/24  2:18 PM  Result Value Ref Range Status   Specimen Description   Final    EXPECTORATED SPUTUM Performed at [facility], [address] [place], [place] [address]    Special Requests   Final    NONE Reflexed from [id number] Performed at [facility], [address], [place], [place] [address]  Gram Stain NO WBC SEEN RARE GRAM NEGATIVE RODS   Final   Culture   Final    FEW Normal  respiratory flora-no Staph aureus or Pseudomonas seen Performed at [facility], [address], [place], [place] [address]    Report Status 07/09/2024 FINAL  Final      Joette Pebbles, MD

## 2024-07-16 NOTE — Progress Notes (Signed)
 RT transported patient on [facility] from 1437 to 1228 with no complications.

## 2024-07-16 NOTE — Progress Notes (Signed)
   08-11-2024 1100  Spiritual Encounters  Type of Visit Follow up  Care provided to: Pt and family  Referral source Patient request;Nurse (RN/NT/LPN)  Reason for visit Religious ritual  OnCall Visit Yes  Spiritual Framework  Presenting Themes Meaning/purpose/sources of inspiration;Significant life change;Impactful experiences and emotions;Courage hope and growth;Rituals and practive  Values/beliefs [place] [employer]  Patient Stress Factors None identified  Family Stress Factors Loss  Intervention Outcomes  Outcomes Connection to spiritual care;Awareness around self/spiritual resourses;Connection to values and goals of care;Autonomy/agency;Awareness of support;Connected to spiritual community   Chaplain was paged and met with Family of 15 at bedside and prayed and anointed [place] at bedside.  Each family member spoke words of love and deep appreciation.  Addressed their aniticipatory Grief.

## 2024-07-16 NOTE — Plan of Care (Signed)
  Problem: Clinical Measurements: Goal: Diagnostic test results will improve Outcome: Progressing Goal: Cardiovascular complication will be avoided Outcome: Progressing   Problem: Coping: Goal: Level of anxiety will decrease Outcome: Progressing   Problem: Pain Managment: Goal: General experience of comfort will improve and/or be controlled Outcome: Progressing

## 2024-07-16 DEATH — deceased

## 2024-07-17 ENCOUNTER — Inpatient Hospital Stay

## 2024-07-17 ENCOUNTER — Inpatient Hospital Stay: Admitting: Internal Medicine

## 2024-08-07 ENCOUNTER — Ambulatory Visit

## 2024-08-07 ENCOUNTER — Ambulatory Visit: Admitting: Internal Medicine

## 2024-08-07 ENCOUNTER — Other Ambulatory Visit

## 2024-08-19 ENCOUNTER — Ambulatory Visit: Admitting: Internal Medicine

## 2024-08-27 ENCOUNTER — Ambulatory Visit

## 2024-08-27 ENCOUNTER — Ambulatory Visit: Admitting: Internal Medicine

## 2024-08-27 ENCOUNTER — Other Ambulatory Visit
